# Patient Record
Sex: Female | Born: 1960 | Race: Asian | Hispanic: No | Marital: Married | Smoking: Never smoker
Health system: Southern US, Community
[De-identification: ages and names within clinical notes are randomized; demographics above are authoritative.]

## PROBLEM LIST (undated history)

## (undated) DIAGNOSIS — E538 Deficiency of other specified B group vitamins: Secondary | ICD-10-CM

## (undated) DIAGNOSIS — I10 Essential (primary) hypertension: Secondary | ICD-10-CM

## (undated) DIAGNOSIS — M948X9 Other specified disorders of cartilage, unspecified sites: Secondary | ICD-10-CM

## (undated) DIAGNOSIS — IMO0001 Reserved for inherently not codable concepts without codable children: Secondary | ICD-10-CM

## (undated) DIAGNOSIS — R209 Unspecified disturbances of skin sensation: Secondary | ICD-10-CM

## (undated) DIAGNOSIS — I639 Cerebral infarction, unspecified: Secondary | ICD-10-CM

## (undated) HISTORY — DX: Essential (primary) hypertension: I10

## (undated) HISTORY — DX: Reserved for inherently not codable concepts without codable children: IMO0001

## (undated) HISTORY — DX: Other specified disorders of cartilage, unspecified sites: M94.8X9

## (undated) HISTORY — DX: Unspecified disturbances of skin sensation: R20.9

## (undated) HISTORY — DX: Cerebral infarction, unspecified: I63.9

## (undated) HISTORY — DX: Deficiency of other specified B group vitamins: E53.8

---

## 1998-03-20 ENCOUNTER — Other Ambulatory Visit: Admission: RE | Admit: 1998-03-20 | Discharge: 1998-03-20 | Payer: Self-pay | Admitting: Gynecology

## 2000-04-26 ENCOUNTER — Encounter: Payer: Self-pay | Admitting: Gynecology

## 2000-04-26 ENCOUNTER — Encounter: Admission: RE | Admit: 2000-04-26 | Discharge: 2000-04-26 | Payer: Self-pay | Admitting: Gynecology

## 2001-05-01 ENCOUNTER — Other Ambulatory Visit: Admission: RE | Admit: 2001-05-01 | Discharge: 2001-05-01 | Payer: Self-pay | Admitting: Gynecology

## 2001-05-10 ENCOUNTER — Encounter (INDEPENDENT_AMBULATORY_CARE_PROVIDER_SITE_OTHER): Payer: Self-pay | Admitting: *Deleted

## 2001-05-10 ENCOUNTER — Other Ambulatory Visit: Admission: RE | Admit: 2001-05-10 | Discharge: 2001-05-10 | Payer: Self-pay | Admitting: Gynecology

## 2001-08-22 ENCOUNTER — Encounter: Payer: Self-pay | Admitting: Gynecology

## 2001-08-22 ENCOUNTER — Encounter: Admission: RE | Admit: 2001-08-22 | Discharge: 2001-08-22 | Payer: Self-pay | Admitting: Gynecology

## 2002-07-30 ENCOUNTER — Other Ambulatory Visit: Admission: RE | Admit: 2002-07-30 | Discharge: 2002-07-30 | Payer: Self-pay | Admitting: Gynecology

## 2002-09-03 ENCOUNTER — Encounter: Admission: RE | Admit: 2002-09-03 | Discharge: 2002-09-03 | Payer: Self-pay | Admitting: Gynecology

## 2002-09-03 ENCOUNTER — Encounter: Payer: Self-pay | Admitting: Gynecology

## 2003-09-15 ENCOUNTER — Other Ambulatory Visit: Admission: RE | Admit: 2003-09-15 | Discharge: 2003-09-15 | Payer: Self-pay | Admitting: Gynecology

## 2003-10-02 ENCOUNTER — Encounter: Admission: RE | Admit: 2003-10-02 | Discharge: 2003-10-02 | Payer: Self-pay | Admitting: Gynecology

## 2004-04-05 ENCOUNTER — Other Ambulatory Visit: Admission: RE | Admit: 2004-04-05 | Discharge: 2004-04-05 | Payer: Self-pay | Admitting: Gynecology

## 2004-11-12 ENCOUNTER — Encounter: Admission: RE | Admit: 2004-11-12 | Discharge: 2004-11-12 | Payer: Self-pay | Admitting: Gynecology

## 2004-12-23 ENCOUNTER — Other Ambulatory Visit: Admission: RE | Admit: 2004-12-23 | Discharge: 2004-12-23 | Payer: Self-pay | Admitting: Gynecology

## 2005-11-30 ENCOUNTER — Encounter: Admission: RE | Admit: 2005-11-30 | Discharge: 2005-11-30 | Payer: Self-pay | Admitting: Gynecology

## 2006-01-18 ENCOUNTER — Other Ambulatory Visit: Admission: RE | Admit: 2006-01-18 | Discharge: 2006-01-18 | Payer: Self-pay | Admitting: Gynecology

## 2006-12-01 ENCOUNTER — Encounter: Admission: RE | Admit: 2006-12-01 | Discharge: 2006-12-01 | Payer: Self-pay | Admitting: Gynecology

## 2007-01-24 ENCOUNTER — Other Ambulatory Visit: Admission: RE | Admit: 2007-01-24 | Discharge: 2007-01-24 | Payer: Self-pay | Admitting: Gynecology

## 2007-12-04 ENCOUNTER — Encounter: Admission: RE | Admit: 2007-12-04 | Discharge: 2007-12-04 | Payer: Self-pay | Admitting: Gynecology

## 2008-12-10 ENCOUNTER — Encounter: Admission: RE | Admit: 2008-12-10 | Discharge: 2008-12-10 | Payer: Self-pay | Admitting: Gynecology

## 2009-12-11 ENCOUNTER — Encounter: Admission: RE | Admit: 2009-12-11 | Discharge: 2009-12-11 | Payer: Self-pay | Admitting: Gynecology

## 2010-11-06 ENCOUNTER — Other Ambulatory Visit: Payer: Self-pay | Admitting: Gynecology

## 2010-11-06 DIAGNOSIS — Z1239 Encounter for other screening for malignant neoplasm of breast: Secondary | ICD-10-CM

## 2010-11-07 ENCOUNTER — Encounter: Payer: Self-pay | Admitting: Endocrinology

## 2010-12-14 ENCOUNTER — Ambulatory Visit
Admission: RE | Admit: 2010-12-14 | Discharge: 2010-12-14 | Disposition: A | Discharge status: Home / Self Care | Payer: Self-pay | Source: Ambulatory Visit | Attending: Gynecology | Admitting: Gynecology

## 2010-12-14 DIAGNOSIS — Z1239 Encounter for other screening for malignant neoplasm of breast: Secondary | ICD-10-CM

## 2011-02-15 LAB — HM PAP SMEAR: HM Pap smear: NORMAL

## 2011-11-09 ENCOUNTER — Other Ambulatory Visit: Payer: Self-pay | Admitting: Gynecology

## 2011-11-09 DIAGNOSIS — Z1231 Encounter for screening mammogram for malignant neoplasm of breast: Secondary | ICD-10-CM

## 2011-12-16 ENCOUNTER — Ambulatory Visit
Admission: RE | Admit: 2011-12-16 | Discharge: 2011-12-16 | Disposition: A | Discharge status: Home / Self Care | Payer: PRIVATE HEALTH INSURANCE | Source: Ambulatory Visit | Attending: Gynecology | Admitting: Gynecology

## 2011-12-16 DIAGNOSIS — Z1231 Encounter for screening mammogram for malignant neoplasm of breast: Secondary | ICD-10-CM

## 2011-12-16 LAB — HM MAMMOGRAPHY: HM Mammogram: NEGATIVE

## 2012-08-08 ENCOUNTER — Encounter: Payer: Self-pay | Admitting: Gastroenterology

## 2012-08-31 ENCOUNTER — Ambulatory Visit (AMBULATORY_SURGERY_CENTER): Payer: PRIVATE HEALTH INSURANCE | Admitting: *Deleted

## 2012-08-31 VITALS — Ht 62.0 in | Wt 132.0 lb

## 2012-08-31 DIAGNOSIS — Z1211 Encounter for screening for malignant neoplasm of colon: Secondary | ICD-10-CM

## 2012-08-31 MED ORDER — NA SULFATE-K SULFATE-MG SULF 17.5-3.13-1.6 GM/177ML PO SOLN: ORAL | Status: DC

## 2012-09-27 ENCOUNTER — Encounter: Payer: PRIVATE HEALTH INSURANCE | Admitting: Gastroenterology

## 2012-09-27 LAB — HM COLONOSCOPY: HM Colonoscopy: NORMAL

## 2012-11-20 ENCOUNTER — Other Ambulatory Visit: Payer: Self-pay | Admitting: Gynecology

## 2012-11-20 DIAGNOSIS — Z1231 Encounter for screening mammogram for malignant neoplasm of breast: Secondary | ICD-10-CM

## 2012-12-18 ENCOUNTER — Ambulatory Visit
Admission: RE | Admit: 2012-12-18 | Discharge: 2012-12-18 | Disposition: A | Discharge status: Home / Self Care | Payer: PRIVATE HEALTH INSURANCE | Source: Ambulatory Visit | Attending: Gynecology | Admitting: Gynecology

## 2012-12-18 DIAGNOSIS — Z1231 Encounter for screening mammogram for malignant neoplasm of breast: Secondary | ICD-10-CM

## 2013-04-08 ENCOUNTER — Encounter: Payer: Self-pay | Admitting: *Deleted

## 2013-04-08 ENCOUNTER — Other Ambulatory Visit: Payer: Self-pay | Admitting: *Deleted

## 2013-04-10 ENCOUNTER — Ambulatory Visit (INDEPENDENT_AMBULATORY_CARE_PROVIDER_SITE_OTHER): Payer: No Typology Code available for payment source | Admitting: Internal Medicine

## 2013-04-10 ENCOUNTER — Encounter: Payer: Self-pay | Admitting: Internal Medicine

## 2013-04-10 VITALS — BP 140/84 | HR 72 | Temp 98.1°F | Resp 16 | Ht 62.0 in | Wt 135.0 lb

## 2013-04-10 DIAGNOSIS — N951 Menopausal and female climacteric states: Secondary | ICD-10-CM

## 2013-04-10 DIAGNOSIS — I10 Essential (primary) hypertension: Secondary | ICD-10-CM | POA: Insufficient documentation

## 2013-04-10 DIAGNOSIS — M25529 Pain in unspecified elbow: Secondary | ICD-10-CM | POA: Insufficient documentation

## 2013-04-10 NOTE — Patient Instructions (Addendum)
Primrose oil- try using this for your hot flashes for now   Health Recommendations for Postmenopausal Women Respected and ongoing research has looked at the most common causes of death, disability, and poor quality of life in postmenopausal women. The causes include heart disease, diseases of blood vessels, diabetes, depression, cancer, and bone loss (osteoporosis). Many things can be done to help lower the chances of developing these and other common problems: CARDIOVASCULAR DISEASE Heart Disease: A heart attack is a medical emergency. Know the signs and symptoms of a heart attack. Below are things women can do to reduce their risk for heart disease.   Do not smoke. If you smoke, quit.  Aim for a healthy weight. Being overweight causes many preventable deaths. Eat a healthy and balanced diet and drink an adequate amount of liquids.  Get moving. Make a commitment to be more physically active. Aim for 30 minutes of activity on most, if not all days of the week.  Eat for heart health. Choose a diet that is low in saturated fat and cholesterol and eliminate trans fat. Include whole grains, vegetables, and fruits. Read and understand the labels on food containers before buying.  Know your numbers. Ask your caregiver to check your blood pressure, cholesterol (total, HDL, LDL, triglycerides) and blood glucose. Work with your caregiver on improving your entire clinical picture.  High blood pressure. Limit or stop your table salt intake (try salt substitute and food seasonings). Avoid salty foods and drinks. Read labels on food containers before buying. Eating well and exercising can help control high blood pressure. STROKE  Stroke is a medical emergency. Stroke may be the result of a blood clot in a blood vessel in the brain or by a brain hemorrhage (bleeding). Know the signs and symptoms of a stroke. To lower the risk of developing a stroke:  Avoid fatty foods.  Quit smoking.  Control your  diabetes, blood pressure, and irregular heart rate. THROMBOPHLEBITIS (BLOOD CLOT) OF THE LEG  Becoming overweight and leading a stationary lifestyle may also contribute to developing blood clots. Controlling your diet and exercising will help lower the risk of developing blood clots. CANCER SCREENING  Breast Cancer: Take steps to reduce your risk of breast cancer.  You should practice "breast self-awareness." This means understanding the normal appearance and feel of your breasts and should include breast self-examination. Any changes detected, no matter how small, should be reported to your caregiver.  After age 35, you should have a clinical breast exam (CBE) every year.  Starting at age 28, you should consider having a mammogram (breast X-ray) every year.  If you have a family history of breast cancer, talk to your caregiver about genetic screening.  If you are at high risk for breast cancer, talk to your caregiver about having an MRI and a mammogram every year.  Intestinal or Stomach Cancer: Tests to consider are a rectal exam, fecal occult blood, sigmoidoscopy, and colonoscopy. Women who are high risk may need to be screened at an earlier age and more often.  Cervical Cancer:  Beginning at age 59, you should have a Pap test every 3 years as long as the past 3 Pap tests have been normal.  If you have had past treatment for cervical cancer or a condition that could lead to cancer, you need Pap tests and screening for cancer for at least 20 years after your treatment.  If you had a hysterectomy for a problem that was not cancer or a condition  that could lead to cancer, then you no longer need Pap tests.  If you are between ages 90 and 29, and you have had normal Pap tests going back 10 years, you no longer need Pap tests.  If Pap tests have been discontinued, risk factors (such as a new sexual partner) need to be reassessed to determine if screening should be resumed.  Some medical  problems can increase the chance of getting cervical cancer. In these cases, your caregiver may recommend more frequent screening and Pap tests.  Uterine Cancer: If you have vaginal bleeding after reaching menopause, you should notify your caregiver.  Ovarian cancer: Other than yearly pelvic exams, there are no reliable tests available to screen for ovarian cancer at this time except for yearly pelvic exams.  Lung Cancer: Yearly chest X-rays can detect lung cancer and should be done on high risk women, such as cigarette smokers and women with chronic lung disease (emphysema).  Skin Cancer: A complete body skin exam should be done at your yearly examination. Avoid overexposure to the sun and ultraviolet light lamps. Use a strong sun block cream when in the sun. All of these things are important in lowering the risk of skin cancer. MENOPAUSE Menopause Symptoms: Hormone therapy products are effective for treating symptoms associated with menopause:  Moderate to severe hot flashes.  Night sweats.  Mood swings.  Headaches.  Tiredness.  Loss of sex drive.  Insomnia.  Other symptoms. Hormone replacement carries certain risks, especially in older women. Women who use or are thinking about using estrogen or estrogen with progestin treatments should discuss that with their caregiver. Your caregiver will help you understand the benefits and risks. The ideal dose of hormone replacement therapy is not known. The Food and Drug Administration (FDA) has concluded that hormone therapy should be used only at the lowest doses and for the shortest amount of time to reach treatment goals.  OSTEOPOROSIS Protecting Against Bone Loss and Preventing Fracture: If you use hormone therapy for prevention of bone loss (osteoporosis), the risks for bone loss must outweigh the risk of the therapy. Ask your caregiver about other medications known to be safe and effective for preventing bone loss and fractures. To guard  against bone loss or fractures, the following is recommended:  If you are less than age 79, take 1000 mg of calcium and at least 600 mg of Vitamin D per day.  If you are greater than age 43 but less than age 65, take 1200 mg of calcium and at least 600 mg of Vitamin D per day.  If you are greater than age 62, take 1200 mg of calcium and at least 800 mg of Vitamin D per day. Smoking and excessive alcohol intake increases the risk of osteoporosis. Eat foods rich in calcium and vitamin D and do weight bearing exercises several times a week as your caregiver suggests. DIABETES Diabetes Melitus: If you have Type I or Type 2 diabetes, you should keep your blood sugar under control with diet, exercise and recommended medication. Avoid too many sweets, starchy and fatty foods. Being overweight can make control more difficult. COGNITION AND MEMORY Cognition and Memory: Menopausal hormone therapy is not recommended for the prevention of cognitive disorders such as Alzheimer's disease or memory loss.  DEPRESSION  Depression may occur at any age, but is common in elderly women. The reasons may be because of physical, medical, social (loneliness), or financial problems and needs. If you are experiencing depression because of medical problems  and control of symptoms, talk to your caregiver about this. Physical activity and exercise may help with mood and sleep. Community and volunteer involvement may help your sense of value and worth. If you have depression and you feel that the problem is getting worse or becoming severe, talk to your caregiver about treatment options that are best for you. ACCIDENTS  Accidents are common and can be serious in the elderly woman. Prepare your house to prevent accidents. Eliminate throw rugs, place hand bars in the bath, shower and toilet areas. Avoid wearing high heeled shoes or walking on wet, snowy, and icy areas. Limit or stop driving if you have vision or hearing problems, or  you feel you are unsteady with you movements and reflexes. HEPATITIS C Hepatitis C is a type of viral infection affecting the liver. It is spread mainly through contact with blood from an infected person. It can be treated, but if left untreated, it can lead to severe liver damage over years. Many people who are infected do not know that the virus is in their blood. If you are a "baby-boomer", it is recommended that you have one screening test for Hepatitis C. IMMUNIZATIONS  Several immunizations are important to consider having during your senior years, including:   Tetanus, diptheria, and pertussis booster shot.  Influenza every year before the flu season begins.  Pneumonia vaccine.  Shingles vaccine.  Others as indicated based on your specific needs. Talk to your caregiver about these. Document Released: 11/25/2005 Document Revised: 09/19/2012 Document Reviewed: 07/21/2008 [facility] Patient Information 2014 [place], [place].

## 2013-04-10 NOTE — Progress Notes (Signed)
Patient ID: Judy Gutierrez, female   DOB: Aug 23, 1961, 52 y.o.   MRN: [id number]  Chief Complaint  Patient presents with  . Hot Flashes  . Joint Pain   HPI  Has been post menopausal for 2 years. She has been having worsening of her hot flashes. This is making her feel uncomfortable  No history of CAD, DVT, blood clots No vaginal dryness, itching or discharge Denies smoking or alcohol consumption No headache No face pain  Has diffuse joint pain at night time for few months. Denies any joint swelling or redness  Review of Systems  Constitutional: Negative for fever and chills.  HENT: Negative for congestion.   Respiratory: Negative for cough and shortness of breath.   Cardiovascular: Negative for chest pain and palpitations.  Gastrointestinal: Negative for heartburn.  Genitourinary: Negative for dysuria.  Musculoskeletal: Positive for joint pain.  Skin: Negative for itching and rash.  Neurological: Negative for dizziness, focal weakness and weakness.  Psychiatric/Behavioral: Negative for depression. The patient does not have insomnia.    No Known Allergies  Past Medical History  Diagnosis Date  . Myalgia and myositis, unspecified   . Other B-complex deficiencies   . Unspecified essential hypertension   . 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 Disturbance of skin sensation     BP 140/84  Pulse 72  Temp(Src) 98.1 F (36.7 C) (Oral)  Resp 16  Ht 5\' 2"  (1.575 m)  Wt 135 lb (61.236 kg)  BMI 24.69 kg/m2  SpO2 97%  LMP 12/08/2010  gen- adult female in NAD HEENT- no pallor, no icterus, no LAD, MMM cvs- ns 1,s2, rrr respi- ctab Ext- no edema or swelling, no joint deformity Neuro- aaox 3, no focal deficit  A/P-  Hot flashes- likely post menopausal. Provided education with materials to read on this. To try primrose oil for now. Behavioral modification also discussed. If no help, will try premarin low dose and reassess  HTN- bp remains well controlled. Off  all meds for now. Watching her diet and exercising  Diffuse joint pain- concerns for arthralgia with her long working hours where she needs to be on her feet. Supportive therapy with warm shower before bed and prn tylenol for now. Workup for myositis like ESR and CK have been normal in past  Labs- cbc, cmp, tsh, flp prior to next visit for physical

## 2013-09-02 ENCOUNTER — Other Ambulatory Visit: Payer: No Typology Code available for payment source

## 2013-09-04 ENCOUNTER — Encounter: Payer: No Typology Code available for payment source | Admitting: Internal Medicine

## 2013-09-10 ENCOUNTER — Other Ambulatory Visit: Payer: No Typology Code available for payment source

## 2013-09-16 ENCOUNTER — Other Ambulatory Visit: Payer: No Typology Code available for payment source

## 2013-09-16 DIAGNOSIS — N951 Menopausal and female climacteric states: Secondary | ICD-10-CM

## 2013-09-16 DIAGNOSIS — I10 Essential (primary) hypertension: Secondary | ICD-10-CM

## 2013-09-16 DIAGNOSIS — M25529 Pain in unspecified elbow: Secondary | ICD-10-CM

## 2013-09-17 LAB — CBC WITH DIFFERENTIAL/PLATELET
Basophils Absolute: 0 10*3/uL (ref 0.0–0.2)
Basos: 0 %
Eos: 5 %
Eosinophils Absolute: 0.2 10*3/uL (ref 0.0–0.4)
HCT: 37.5 % (ref 34.0–46.6)
Hemoglobin: 12.7 g/dL (ref 11.1–15.9)
Immature Grans (Abs): 0 10*3/uL (ref 0.0–0.1)
Immature Granulocytes: 0 %
Lymphocytes Absolute: 2.1 10*3/uL (ref 0.7–3.1)
Lymphs: 49 %
MCH: 28.3 pg (ref 26.6–33.0)
MCHC: 33.9 g/dL (ref 31.5–35.7)
MCV: 84 fL (ref 79–97)
Monocytes Absolute: 0.2 10*3/uL (ref 0.1–0.9)
Monocytes: 6 %
Neutrophils Absolute: 1.7 10*3/uL (ref 1.4–7.0)
Neutrophils Relative %: 40 %
RBC: 4.48 x10E6/uL (ref 3.77–5.28)
RDW: 12.6 % (ref 12.3–15.4)
WBC: 4.3 10*3/uL (ref 3.4–10.8)

## 2013-09-17 LAB — COMPREHENSIVE METABOLIC PANEL
ALT: 28 IU/L (ref 0–32)
AST: 28 IU/L (ref 0–40)
Albumin/Globulin Ratio: 1.3 (ref 1.1–2.5)
Albumin: 4.2 g/dL (ref 3.5–5.5)
Alkaline Phosphatase: 125 IU/L — ABNORMAL HIGH (ref 39–117)
BUN/Creatinine Ratio: 25 — ABNORMAL HIGH (ref 9–23)
BUN: 15 mg/dL (ref 6–24)
CO2: 24 mmol/L (ref 18–29)
Calcium: 9.5 mg/dL (ref 8.7–10.2)
Chloride: 104 mmol/L (ref 97–108)
Creatinine, Ser: 0.59 mg/dL (ref 0.57–1.00)
GFR calc Af Amer: 122 mL/min/{1.73_m2} (ref >59)
GFR calc non Af Amer: 106 mL/min/{1.73_m2} (ref >59)
Globulin, Total: 3.2 g/dL (ref 1.5–4.5)
Glucose: 105 mg/dL — ABNORMAL HIGH (ref 65–99)
Potassium: 4.3 mmol/L (ref 3.5–5.2)
Sodium: 143 mmol/L (ref 134–144)
Total Bilirubin: 0.4 mg/dL (ref 0.0–1.2)
Total Protein: 7.4 g/dL (ref 6.0–8.5)

## 2013-09-17 LAB — LIPID PANEL
Chol/HDL Ratio: 2.8 ratio units (ref 0.0–4.4)
Cholesterol, Total: 196 mg/dL (ref 100–199)
HDL: 70 mg/dL (ref >39)
LDL Calculated: 117 mg/dL — ABNORMAL HIGH (ref 0–99)
Triglycerides: 47 mg/dL (ref 0–149)
VLDL Cholesterol Cal: 9 mg/dL (ref 5–40)

## 2013-09-17 LAB — TSH: TSH: 3.59 u[IU]/mL (ref 0.450–4.500)

## 2013-09-18 ENCOUNTER — Ambulatory Visit (INDEPENDENT_AMBULATORY_CARE_PROVIDER_SITE_OTHER): Payer: No Typology Code available for payment source | Admitting: Internal Medicine

## 2013-09-18 ENCOUNTER — Encounter: Payer: Self-pay | Admitting: Internal Medicine

## 2013-09-18 VITALS — BP 130/80 | HR 84 | Temp 97.4°F | Resp 12 | Ht 60.0 in | Wt 138.2 lb

## 2013-09-18 DIAGNOSIS — Z0000 Encounter for general adult medical examination without abnormal findings: Secondary | ICD-10-CM

## 2013-09-18 DIAGNOSIS — R7309 Other abnormal glucose: Secondary | ICD-10-CM

## 2013-09-18 DIAGNOSIS — R748 Abnormal levels of other serum enzymes: Secondary | ICD-10-CM | POA: Insufficient documentation

## 2013-09-18 DIAGNOSIS — R739 Hyperglycemia, unspecified: Secondary | ICD-10-CM | POA: Insufficient documentation

## 2013-09-18 NOTE — Progress Notes (Signed)
Patient ID: Judy Gutierrez, female   DOB: 1961-06-06, 52 y.o.   MRN: [id number]  Chief complaint- annual exam  No Known Allergies  HPI 52 y/o female patient is here for her annual visit. She has history of hypertension but never been on any medication and now her bp has been normal for 2 years.   Mammogram feb 2014 normal Pap smear normal at her gyn office She cancelled her colonoscopy last year as she was scared. She agrees to FOBT card Bone scan last year was normal as per patient Does not want influenza vaccine  Review of Systems  Constitutional: Negative for fever, chills, weight loss, malaise/fatigue and diaphoresis.  HENT: Negative for congestion, hearing loss and sore throat.   Eyes: Negative for blurred vision, double vision and discharge.  Respiratory: Negative for cough, sputum production, shortness of breath and wheezing.   Cardiovascular: Negative for chest pain, palpitations, orthopnea and leg swelling.  Gastrointestinal: Negative for heartburn, nausea, vomiting, abdominal pain, diarrhea and constipation.  Genitourinary: Negative for dysuria, urgency, frequency and flank pain.  Musculoskeletal: Negative for back pain, falls, joint pain and myalgias.  Skin: Negative for itching and rash.  Neurological: Negative for dizziness, tingling, focal weakness and headaches.  Psychiatric/Behavioral: Negative for depression and memory loss. The patient is not nervous/anxious.    Past Medical History  Diagnosis Date  . Myalgia and myositis, unspecified   . Other B-complex deficiencies   . Unspecified essential hypertension   . 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 Disturbance of skin sensation    Past Surgical History  Procedure Laterality Date  . Cesarean section  1992   Current Outpatient Prescriptions on File Prior to Visit  Medication Sig Dispense Refill  . calcium carbonate (OS-CAL) 600 MG TABS Take 600 mg by mouth daily.      . Multiple Vitamins-Minerals  (MULTIVITAMIN WITH MINERALS) tablet Take 1 tablet by mouth daily.       No current facility-administered medications on file prior to visit.   History reviewed. No pertinent family history.  History   Social History  . Marital Status: Married    Spouse Name: N/A    Number of Children: N/A  . Years of Education: N/A   Occupational History  . Not on file.   Social History Main Topics  . Smoking status: Never Smoker   . Smokeless tobacco: Never Used  . Alcohol Use: No  . Drug Use: No  . Sexual Activity: Yes   Other Topics Concern  . Not on file   Social History Narrative  . No narrative on file   Physical exam BP 130/80  Pulse 84  Temp(Src) 97.4 F (36.3 C) (Oral)  Resp 12  Ht 5' (1.524 m)  Wt 138 lb 3.2 oz (62.687 kg)  BMI 26.99 kg/m2  SpO2 99%  LMP 12/08/2010  General- adult female in no acute distress Head- atraumatic, normocephalic Eyes- PERRLA, EOMI, no pallor, no icterus, no discharge Neck- no lymphadenopathy, no thyromegaly, no jugular vein distension, no carotid bruit Ears- left ear normal tympanic membrane and normal external ear canal , right ear normal tympanic membrane and normal external ear canal Chest- no chest wall deformities, no chest wall tenderness Breast- done by gyn Cardiovascular- normal s1,s2, no murmurs/ rubs/ gallops Respiratory- bilateral clear to auscultation, no wheeze, no rhonchi, no crackles Abdomen- bowel sounds present, soft, non tender, no organomegaly, no abdominal bruits, no guarding or rigidity, no CVA tenderness Pelvic exam- done by her gyn Musculoskeletal-  able to move all 4 extremities, no spinal and paraspinal tenderness, steady gait, no use of assistive device Neurological- no focal deficit, normal reflexes, normal muscle strength, normal sensation to fine touch and vibration Psychiatry- alert and oriented to person, place and time, normal mood and affect Skin- warm and dr, no rash or lesions  Labs reviewed CBC     Component Value Date/Time   WBC 4.3 09/16/2013 0820   RBC 4.48 09/16/2013 0820   HGB 12.7 09/16/2013 0820   HCT 37.5 09/16/2013 0820   MCV 84 09/16/2013 0820   MCH 28.3 09/16/2013 0820   MCHC 33.9 09/16/2013 0820   RDW 12.6 09/16/2013 0820   LYMPHSABS 2.1 09/16/2013 0820   EOSABS 0.2 09/16/2013 0820   BASOSABS 0.0 09/16/2013 0820    CMP     Component Value Date/Time   NA 143 09/16/2013 0820   K 4.3 09/16/2013 0820   CL 104 09/16/2013 0820   CO2 24 09/16/2013 0820   GLUCOSE 105* 09/16/2013 0820   BUN 15 09/16/2013 0820   CREATININE 0.59 09/16/2013 0820   CALCIUM 9.5 09/16/2013 0820   PROT 7.4 09/16/2013 0820   AST 28 09/16/2013 0820   ALT 28 09/16/2013 0820   ALKPHOS 125* 09/16/2013 0820   BILITOT 0.4 09/16/2013 0820   GFRNONAA 106 09/16/2013 0820   GFRAA 122 09/16/2013 0820   Lipid Panel     Component Value Date/Time   TRIG 47 09/16/2013 0820   HDL 70 09/16/2013 0820   CHOLHDL 2.8 09/16/2013 0820   LDLCALC 117* 09/16/2013 0820   Lab Results  Component Value Date   TSH 3.590 09/16/2013    Assessment/plan  1. Hyperglycemia Cut down on sweets and fried food. Dietary and exercise counselling provided. Has family hx of dm, will rule out dm by checking a1c next visit - Hemoglobin A1c; Future - Basic Metabolic Panel; Future  2. Routine general medical examination at a health care facility uptodate with her gyn exam. Does not want colonoscopy. Provided FOBT card with instructions. Refuses influenza vaccine. Reviewed labs. Dietary and exercise counselling. dexa scan uptodate  3. Elevated alkaline phosphatase level Currently no symptoms. Will monitor clinically and follow lab in 3 months. Explained to notify if has abdominal pain - Hepatic Function Panel; Future

## 2013-09-24 ENCOUNTER — Other Ambulatory Visit: Payer: Self-pay | Admitting: Internal Medicine

## 2013-09-24 ENCOUNTER — Other Ambulatory Visit: Payer: No Typology Code available for payment source

## 2013-09-24 DIAGNOSIS — Z1211 Encounter for screening for malignant neoplasm of colon: Secondary | ICD-10-CM

## 2013-09-26 LAB — FECAL OCCULT BLOOD, IMMUNOCHEMICAL: Fecal Occult Bld: NEGATIVE

## 2013-10-23 ENCOUNTER — Other Ambulatory Visit: Payer: Self-pay | Admitting: Physical Medicine and Rehabilitation

## 2013-10-23 ENCOUNTER — Encounter: Payer: Self-pay | Admitting: *Deleted

## 2013-10-23 ENCOUNTER — Inpatient Hospital Stay (HOSPITAL_COMMUNITY)
Admission: RE | Admit: 2013-10-23 | Discharge: 2013-10-29 | DRG: 945 | Disposition: A | Discharge status: Home / Self Care | Payer: BC Managed Care – PPO | Source: Intra-hospital | Attending: Physical Medicine & Rehabilitation | Admitting: Physical Medicine & Rehabilitation

## 2013-10-23 DIAGNOSIS — R739 Hyperglycemia, unspecified: Secondary | ICD-10-CM

## 2013-10-23 DIAGNOSIS — S52309A Unspecified fracture of shaft of unspecified radius, initial encounter for closed fracture: Secondary | ICD-10-CM

## 2013-10-23 DIAGNOSIS — M25529 Pain in unspecified elbow: Secondary | ICD-10-CM

## 2013-10-23 DIAGNOSIS — S329XXA Fracture of unspecified parts of lumbosacral spine and pelvis, initial encounter for closed fracture: Secondary | ICD-10-CM

## 2013-10-23 DIAGNOSIS — A498 Other bacterial infections of unspecified site: Secondary | ICD-10-CM | POA: Diagnosis present

## 2013-10-23 DIAGNOSIS — I10 Essential (primary) hypertension: Secondary | ICD-10-CM | POA: Diagnosis present

## 2013-10-23 DIAGNOSIS — S322XXA Fracture of coccyx, initial encounter for closed fracture: Secondary | ICD-10-CM

## 2013-10-23 DIAGNOSIS — D62 Acute posthemorrhagic anemia: Secondary | ICD-10-CM | POA: Diagnosis present

## 2013-10-23 DIAGNOSIS — B9620 Unspecified Escherichia coli [E. coli] as the cause of diseases classified elsewhere: Secondary | ICD-10-CM | POA: Diagnosis present

## 2013-10-23 DIAGNOSIS — S3210XA Unspecified fracture of sacrum, initial encounter for closed fracture: Secondary | ICD-10-CM | POA: Diagnosis present

## 2013-10-23 DIAGNOSIS — Z5189 Encounter for other specified aftercare: Principal | ICD-10-CM

## 2013-10-23 DIAGNOSIS — J982 Interstitial emphysema: Secondary | ICD-10-CM | POA: Diagnosis present

## 2013-10-23 DIAGNOSIS — S52209A Unspecified fracture of shaft of unspecified ulna, initial encounter for closed fracture: Secondary | ICD-10-CM | POA: Diagnosis present

## 2013-10-23 DIAGNOSIS — N390 Urinary tract infection, site not specified: Secondary | ICD-10-CM | POA: Diagnosis present

## 2013-10-23 DIAGNOSIS — F411 Generalized anxiety disorder: Secondary | ICD-10-CM | POA: Diagnosis present

## 2013-10-23 DIAGNOSIS — K5900 Constipation, unspecified: Secondary | ICD-10-CM | POA: Diagnosis present

## 2013-10-23 DIAGNOSIS — S329XXS Fracture of unspecified parts of lumbosacral spine and pelvis, sequela: Secondary | ICD-10-CM

## 2013-10-23 DIAGNOSIS — S32509A Unspecified fracture of unspecified pubis, initial encounter for closed fracture: Secondary | ICD-10-CM | POA: Diagnosis present

## 2013-10-23 DIAGNOSIS — H538 Other visual disturbances: Secondary | ICD-10-CM | POA: Diagnosis present

## 2013-10-23 LAB — URINE MICROSCOPIC-ADD ON

## 2013-10-23 LAB — URINALYSIS, ROUTINE W REFLEX MICROSCOPIC
Bilirubin Urine: NEGATIVE
Glucose, UA: NEGATIVE mg/dL
Ketones, ur: NEGATIVE mg/dL
Nitrite: POSITIVE — AB
Protein, ur: NEGATIVE mg/dL
Specific Gravity, Urine: 1.017 (ref 1.005–1.030)
Urobilinogen, UA: 0.2 mg/dL (ref 0.0–1.0)
pH: 6.5 (ref 5.0–8.0)

## 2013-10-23 MED ORDER — PROCHLORPERAZINE EDISYLATE 5 MG/ML IJ SOLN
5.0000 mg | Freq: Four times a day (QID) | INTRAMUSCULAR | Status: DC | PRN
  Filled 2013-10-23: qty 2

## 2013-10-23 MED ORDER — ACETAMINOPHEN 325 MG PO TABS
325.0000 mg | ORAL_TABLET | ORAL | Status: DC | PRN
  Administered 2013-10-25: 650 mg via ORAL
  Filled 2013-10-23: qty 2

## 2013-10-23 MED ORDER — ALUM & MAG HYDROXIDE-SIMETH 200-200-20 MG/5ML PO SUSP: 30.0000 mL | ORAL | Status: DC | PRN

## 2013-10-23 MED ORDER — OXYCODONE HCL 5 MG PO TABS: 5.0000 mg | ORAL_TABLET | ORAL | Status: DC | PRN

## 2013-10-23 MED ORDER — DIPHENHYDRAMINE HCL 12.5 MG/5ML PO ELIX
12.5000 mg | ORAL_SOLUTION | Freq: Four times a day (QID) | ORAL | Status: DC | PRN
  Filled 2013-10-23: qty 10

## 2013-10-23 MED ORDER — FLEET ENEMA 7-19 GM/118ML RE ENEM
1.0000 | ENEMA | Freq: Once | RECTAL | Status: AC | PRN
  Filled 2013-10-23: qty 1

## 2013-10-23 MED ORDER — SENNOSIDES-DOCUSATE SODIUM 8.6-50 MG PO TABS
1.0000 | ORAL_TABLET | Freq: Every day | ORAL | Status: DC
  Administered 2013-10-23 – 2013-10-28 (×6): 1 via ORAL
  Filled 2013-10-23 (×7): qty 1

## 2013-10-23 MED ORDER — ENOXAPARIN SODIUM 40 MG/0.4ML ~~LOC~~ SOLN
40.0000 mg | Freq: Every morning | SUBCUTANEOUS | Status: DC
  Administered 2013-10-24 – 2013-10-29 (×6): 40 mg via SUBCUTANEOUS
  Filled 2013-10-23 (×6): qty 0.4

## 2013-10-23 MED ORDER — PROCHLORPERAZINE 25 MG RE SUPP
12.5000 mg | Freq: Four times a day (QID) | RECTAL | Status: DC | PRN
  Filled 2013-10-23: qty 1

## 2013-10-23 MED ORDER — OXYCODONE-ACETAMINOPHEN 5-325 MG PO TABS
1.0000 | ORAL_TABLET | ORAL | Status: DC | PRN
  Administered 2013-10-24 – 2013-10-26 (×4): 1 via ORAL
  Filled 2013-10-23: qty 1
  Filled 2013-10-23: qty 2
  Filled 2013-10-23 (×2): qty 1

## 2013-10-23 MED ORDER — GUAIFENESIN-DM 100-10 MG/5ML PO SYRP: 5.0000 mL | ORAL_SOLUTION | Freq: Four times a day (QID) | ORAL | Status: DC | PRN

## 2013-10-23 MED ORDER — BISACODYL 10 MG RE SUPP: 10.0000 mg | Freq: Every day | RECTAL | Status: DC | PRN

## 2013-10-23 MED ORDER — PROCHLORPERAZINE MALEATE 5 MG PO TABS
5.0000 mg | ORAL_TABLET | Freq: Four times a day (QID) | ORAL | Status: DC | PRN
  Filled 2013-10-23: qty 2

## 2013-10-23 MED ORDER — METHOCARBAMOL 500 MG PO TABS: 500.0000 mg | ORAL_TABLET | Freq: Four times a day (QID) | ORAL | Status: DC | PRN

## 2013-10-23 MED ORDER — TRAZODONE HCL 50 MG PO TABS: 25.0000 mg | ORAL_TABLET | Freq: Every evening | ORAL | Status: DC | PRN

## 2013-10-23 MED ORDER — TRAMADOL HCL 50 MG PO TABS: 50.0000 mg | ORAL_TABLET | Freq: Four times a day (QID) | ORAL | Status: DC | PRN

## 2013-10-23 NOTE — Progress Notes (Signed)
Patient ID: Judy CumminsShilpa S Dolin, female   DOB: May 25, 1961, 53 y.o.   MRN: [id number] Patient admitted to room 4MW08 via stretcher, escorted by EMS staff and family.  Patient and spouse verbalized understanding of rehab process, no questions asked.  Verbalized agreement and signed fall safety plan.  VSS.  Appears to be in no immediate distress at this time.  Dani Gobbleeardon, Kamsiyochukwu Buist J, RN
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
## 2013-10-24 ENCOUNTER — Inpatient Hospital Stay (HOSPITAL_COMMUNITY): Payer: BC Managed Care – PPO

## 2013-10-24 ENCOUNTER — Inpatient Hospital Stay (HOSPITAL_COMMUNITY): Payer: PRIVATE HEALTH INSURANCE

## 2013-10-24 ENCOUNTER — Inpatient Hospital Stay (HOSPITAL_COMMUNITY): Payer: BC Managed Care – PPO | Admitting: Occupational Therapy

## 2013-10-24 DIAGNOSIS — S52209A Unspecified fracture of shaft of unspecified ulna, initial encounter for closed fracture: Secondary | ICD-10-CM

## 2013-10-24 DIAGNOSIS — S52309A Unspecified fracture of shaft of unspecified radius, initial encounter for closed fracture: Secondary | ICD-10-CM

## 2013-10-24 DIAGNOSIS — S329XXA Fracture of unspecified parts of lumbosacral spine and pelvis, initial encounter for closed fracture: Secondary | ICD-10-CM

## 2013-10-24 DIAGNOSIS — D62 Acute posthemorrhagic anemia: Secondary | ICD-10-CM | POA: Diagnosis present

## 2013-10-24 LAB — CBC WITH DIFFERENTIAL/PLATELET
Basophils Absolute: 0 10*3/uL (ref 0.0–0.1)
Basophils Relative: 0 % (ref 0–1)
Eosinophils Absolute: 0.1 10*3/uL (ref 0.0–0.7)
Eosinophils Relative: 2 % (ref 0–5)
HCT: 30.8 % — ABNORMAL LOW (ref 36.0–46.0)
Hemoglobin: 10.1 g/dL — ABNORMAL LOW (ref 12.0–15.0)
Lymphocytes Relative: 28 % (ref 12–46)
Lymphs Abs: 1.5 10*3/uL (ref 0.7–4.0)
MCH: 28.5 pg (ref 26.0–34.0)
MCHC: 32.8 g/dL (ref 30.0–36.0)
MCV: 87 fL (ref 78.0–100.0)
Monocytes Absolute: 0.4 10*3/uL (ref 0.1–1.0)
Monocytes Relative: 7 % (ref 3–12)
Neutro Abs: 3.4 10*3/uL (ref 1.7–7.7)
Neutrophils Relative %: 63 % (ref 43–77)
Platelets: 324 10*3/uL (ref 150–400)
RBC: 3.54 MIL/uL — ABNORMAL LOW (ref 3.87–5.11)
RDW: 13 % (ref 11.5–15.5)
WBC: 5.4 10*3/uL (ref 4.0–10.5)

## 2013-10-24 LAB — COMPREHENSIVE METABOLIC PANEL
ALT: 25 U/L (ref 0–35)
AST: 22 U/L (ref 0–37)
Albumin: 3 g/dL — ABNORMAL LOW (ref 3.5–5.2)
Alkaline Phosphatase: 109 U/L (ref 39–117)
BUN: 18 mg/dL (ref 6–23)
CO2: 24 mEq/L (ref 19–32)
Calcium: 8.9 mg/dL (ref 8.4–10.5)
Chloride: 101 mEq/L (ref 96–112)
Creatinine, Ser: 0.53 mg/dL (ref 0.50–1.10)
GFR calc Af Amer: >90 mL/min (ref >90)
GFR calc non Af Amer: >90 mL/min (ref >90)
Glucose, Bld: 114 mg/dL — ABNORMAL HIGH (ref 70–99)
Potassium: 4.3 mEq/L (ref 3.7–5.3)
Sodium: 141 mEq/L (ref 137–147)
Total Bilirubin: 0.8 mg/dL (ref 0.3–1.2)
Total Protein: 7.4 g/dL (ref 6.0–8.3)
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
## 2013-10-24 NOTE — Progress Notes (Signed)
Occupational Therapy Note  Patient Details  Name: Judy CumminsShilpa S Gutierrez MRN: [id number] Date of Birth: 1961-02-25 Today's Date: 10/24/2013  Time: [id number] Pt denies pain Individual Therapy  Pt educated on use of reacher, sock aid, and long handle sponge.  Pt return demonstrated use of sock aid without assistance.  Discussed discharge plans.  Pt was able to independently recall RLE TDWB precautions and Lt wrist precautions.    Judy Gutierrez, Judy Gutierrez [facility] LLCChappell 10/24/2013, 12:14 PM

## 2013-10-24 NOTE — Progress Notes (Signed)
Patient information reviewed and entered into eRehab system by Christa Fasig, RN, CRRN, PPS Coordinator.  Information including medical coding and functional independence measure will be reviewed and updated through discharge.    

## 2013-10-25 ENCOUNTER — Inpatient Hospital Stay (HOSPITAL_COMMUNITY): Payer: BC Managed Care – PPO

## 2013-10-25 ENCOUNTER — Encounter (HOSPITAL_COMMUNITY): Payer: BC Managed Care – PPO

## 2013-10-25 NOTE — Progress Notes (Signed)
Physical Therapy Session Note  Patient Details  Name: Judy CumminsShilpa S Waszak MRN: [id number] Date of Birth: 07-May-1961  Today's Date: 10/25/2013 Time:Treatment Session 1: 1000-1100; Treatment Session 2: 1300-1340 Time Calculation (min): Treatment Session 1: 60 min; Treatment Session 2: 40min   Skilled Therapeutic Interventions/Progress Updates:  Treatment Session 1:  1:1. Pt received sitting in w/c, ready for therapy. Pt able to propel w/c 150'x2 this session w/ R UE/L LE supervision to intermittent min A for obstacle negotiation. Discussion regarding stair negotiation as pt will not be able to safely hop up or scoot up on bottom. Introduced idea of w/c ramp vs. Bumping, pt prefers family to be taught bumping at this time. Pt able to amb 25'x2 w/ L PFRW and min guard to close (S). Pt req decreased standing rest breaks during second bout of amb as well as demonstrated improved quality of hopping today vs. Yesterday. Pt performed supine therex for B LE strength/endurance/ROM. Exercises included 1-2x10 reps: ankle pumps, glute sets, quad sets, heel slides, hip abd, SAQ, and LAQ (assisted). Pt req min A for SPT tx mat>w/c and mod A w/ step-by-step verbal cues for seq t/f sup<>sit on tx mat. Pt sitting in w/c at end of session w/ all needs in reach.   Treatment Session 2:  1:1. Pt received sitting in w/c, ready for therapy. Focus this session on functional mobility and transfers in home environment setting. Pt able to propel w/c on hard level and carpeted surfaces w/ R UE/L LE and overall (S). Pt able to amb 20'x2 on carpeted surface w/ L PFRW as well as perform safe transfer to couch, reclining chair and bed in therapy apartment w/ close (S)-intermittent min guard assist. Pt continues to req cueing for seq of bed mobility 2/2 pain w/ min-mod A. Pt also able to perform car t/f w/ min-mod A and mod cues. Pt sitting in w/c at end of session w/ all needs in reach.   LTGs regarding ambulatory distance upgraded 2/2  demonstrated progress.   Therapy Documentation Precautions:  Precautions Precautions: Fall Restrictions Weight Bearing Restrictions: Yes LUE Weight Bearing: Weight bearing as tolerated (at elbow) RLE Weight Bearing: Touchdown weight bearing LLE Weight Bearing: Weight bearing as tolerated Other Position/Activity Restrictions: NWB RLE, WBAT LLE, okay to WB through LUE elbow, NWB through L wrist   Pain: Pain Assessment Pain Assessment: 0-10 Pain Score: 4  Pain Type: Acute pain Pain Location: Hip Pain Orientation: Right;Left Pain Descriptors / Indicators: Aching Pain Intervention(s): Repositioned  See FIM for current functional status  Therapy/Group: Individual Therapy  Denzil HughesKing, Charlane Westry S 10/25/2013, 11:02 AM
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
## 2013-10-25 NOTE — Progress Notes (Signed)
[facility] Individual Statement of Services  Patient Name:  Judy Gutierrez  Date:  10/25/2013  Welcome to the [facility].  Our goal is to provide you with an individualized program based on your diagnosis and situation, designed to meet your specific needs.  With this comprehensive rehabilitation program, you will be expected to participate in at least 3 hours of rehabilitation therapies Monday-Friday, with modified therapy programming on the weekends.  Your rehabilitation program will include the following services:  Physical Therapy (PT), Occupational Therapy (OT), 24 hour per day rehabilitation nursing, Therapeutic Recreaction (TR), Case Management (Social Worker), Rehabilitation Medicine, Nutrition Services and Pharmacy Services  Weekly team conferences will be held on Tuesdays to discuss your progress.  Your Social Worker will talk with you frequently to get your input and to update you on team discussions.  Team conferences with you and your family in attendance may also be held.  Expected length of stay: 7 days  Overall anticipated outcome: modified independent  Depending on your progress and recovery, your program may change. Your Social Worker will coordinate services and will keep you informed of any changes. Your Social Worker's name and contact numbers are listed  below.  The following services may also be recommended but are not provided by the [facility]:   Driving Evaluations  Home Health Rehabiltiation Services  Outpatient Rehabilitation Services  Vocational Rehabilitation   Arrangements will be made to provide these services after discharge if needed.  Arrangements include referral to agencies that provide these services.  Your insurance has been verified to be:  [employer] Your primary doctor is:  Dr. Glade LloydPandey  Pertinent information will be shared with your doctor and your insurance company.  Social Worker:  [place]  Camdan Burdi, [place] [phone] or ([phone]) [phone]   Information discussed with and copy given to patient by: Amada JupiterHOYLE, Marlys Stegmaier, 10/25/2013, 4:44 PM
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
## 2013-10-26 ENCOUNTER — Inpatient Hospital Stay (HOSPITAL_COMMUNITY): Payer: BC Managed Care – PPO | Admitting: *Deleted

## 2013-10-26 DIAGNOSIS — D62 Acute posthemorrhagic anemia: Secondary | ICD-10-CM

## 2013-10-26 DIAGNOSIS — R7309 Other abnormal glucose: Secondary | ICD-10-CM

## 2013-10-26 DIAGNOSIS — M25529 Pain in unspecified elbow: Secondary | ICD-10-CM

## 2013-10-26 DIAGNOSIS — IMO0002 Reserved for concepts with insufficient information to code with codable children: Secondary | ICD-10-CM

## 2013-10-26 LAB — URINE CULTURE: Colony Count: >=100000

## 2013-10-26 NOTE — Progress Notes (Signed)
Physical Therapy Session Note  Patient Details  Name: Judy Gutierrez MRN: [id number] Date of Birth: November 04, 1960  Today's Date: 10/26/2013 Time:  -  8:30-9:00 (30min) - (30min missed due to wanting to eat breakfast, not got schedule, made up min later in morning) 11:40-12:10 (30min) and 14:15-15:00 (45min)     Short Term Goals: Week 1:  PT Short Term Goal 1 (Week 1): STGs=LTGs  Skilled Therapeutic Interventions/Progress Updates:    1:3 Tx focused on WC propulsion, gait with PFRW, and therex for LE strength and ROM. Spouse present, but pt wanting to wait until tomorrow to do WC bump when rest of family assisting will be present.  [facility], wanting to eat breakfast before therapy, attempted rearranging schedule.  Pt propelled WC 2x150' with S and cues for steering techniuque.  Sit<>stand with Min A Gait x32' with PFRW and min-guard A with hopping technique. Pt told she was able to touch town RLE, but chooses to keep it NWB during gait. Second gait x25' with min A and improved TTWB technique, less hard hopping.  Stand-step transfer with RW WC<>mat with min A/ close  Seated therex including 2x10 of each of the following: ankle pumps, LAQ, and marching bilerally  2:3 Pt up in WC, propelled WC<>gym with S and safe technique. Pt needing instruction for WC parts management. Instructed pt in supine HEP per handout, 2x10 with assist prn and cues for technique. Pt needing bolster at knees for comfort and maxislide for assist. Pt unable to tolerate flat supine position.   Stand-step transfer with RW and min-guard/S [facility] needs in reach for lunch.   3:3 Pt up in WC, propelled WC<>gym.  Gait in controlled setting x20' with min-guard, limited by pt fatigue from long day.  Nustep x4315min, level 3, with bil LEs and RUE only, pt being careful to apply no pressure to RLE, allowing for passive ROM only on that side. Pt had no difficulty and reporting that the activity felt good to  "loosen" up the muscles and joints.  Transfers with min-guard/S with RW. WC<>toilet transfer with min-guard and safe technique noted.  Pt left up in recliner with all needs in reach.   Therapy Documentation Precautions:  Precautions Precautions: Fall Restrictions Weight Bearing Restrictions: Yes LUE Weight Bearing: Weight bearing as tolerated (at elbow) RLE Weight Bearing: Touchdown weight bearing LLE Weight Bearing: Weight bearing as tolerated Other Position/Activity Restrictions: NWB RLE, WBAT LLE, okay to WB through LUE elbow, NWB through L wrist    Vital Signs: Therapy Vitals Temp: 98.4 F (36.9 C) Temp src: Oral Pulse Rate: 84 Resp: 18 BP: 115/77 mmHg Patient Position, if appropriate: Lying Oxygen Therapy SpO2: 97 % O2 Device: None (Room air) Pain: no complaints     See FIM for current functional status  Therapy/Group: Individual Therapy [name], PT, DPT   10/26/2013, 8:55 AM
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
## 2013-10-26 NOTE — Progress Notes (Signed)
Occupational Therapy Note  Patient Details  Name: Judy Gutierrez MRN: [id number] Date of Birth: 03/06/1961 Today's Date: 10/26/2013  Time:0900-1000   1st session Pain:none Individual session 1st session: Engaged in functional transfers, sit to atand, standing balance, AE use.  Pt. In wc upon OT arrival.  Husband, AJ present.  Pt. Elected to shower.  Did transfer from wc to shower>< with minimal assist and min cues for TDWB on right leg.  Pt stood and washed peri area with minimal assist.  Dressed at sink with encouragement to use AE.   Pt. Kept stating she could not use AE.   Needed assist donning pants on feet and shoes.  Pt. Left in room with nursing administering medications.  Time: 1330-1400  (30 min) Pain:none Individual session   Addressed functional mobility, sit to stand, standing balance during functional activity.  Pt. Ambulated with RW from room to OT kitchen  With minimal assist and wc being rolled behind.  She using RW and stood at sink and loaded dishwasher and turned on dishwasher with counter for support.  She Ambulated to sofa and went >< with minimal assist.  Demonstrated transfer to shower, but did not have time to practice. Pt. Ambulated back to room with RW  Transferred to>< toilet and managed clothes with minimal assist.  Left in recliner with call bell within reach.   Humberto Sealsdwards, Colm Lyford J 10/26/2013, 10:10 AM

## 2013-10-27 ENCOUNTER — Inpatient Hospital Stay (HOSPITAL_COMMUNITY): Payer: BC Managed Care – PPO | Admitting: *Deleted

## 2013-10-27 MED ORDER — CIPROFLOXACIN HCL 250 MG PO TABS
250.0000 mg | ORAL_TABLET | Freq: Two times a day (BID) | ORAL | Status: DC
  Administered 2013-10-27 – 2013-10-29 (×4): 250 mg via ORAL
  Filled 2013-10-27 (×6): qty 1
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
## 2013-10-27 NOTE — Progress Notes (Signed)
Occupational Therapy Note  Patient Details  Name: Judy Gutierrez MRN: [id number] Date of Birth: 1960-12-15 Today's Date: 10/27/2013  Time:  [id number]  (45 min) Pain: none Individual session    Pt. In wc upon OT arrival.  Pt. Gathered supplies at wc level; husband assisted as well.  Addressed functional mobility with RW to shower, transfers, AE use, and instruction.  Pt ambulated to shower with minimal assistance using RW.  Pt transferred to shower bench.  Pt. Used LH sponge for LB.  Pt. did sit to stand with miin assist x3 during shower.  [facility] and dressed [facility].  Pt used AE for LB dressing with instructional cues.  Left pt in wc   call bell,phone within reach.  Husband in room as well.        Humberto Sealsdwards, [place] J 10/27/2013, 8:26 AM

## 2013-10-28 ENCOUNTER — Inpatient Hospital Stay (HOSPITAL_COMMUNITY): Payer: BC Managed Care – PPO | Admitting: Occupational Therapy

## 2013-10-28 ENCOUNTER — Encounter (HOSPITAL_COMMUNITY): Payer: BC Managed Care – PPO | Admitting: Occupational Therapy

## 2013-10-28 ENCOUNTER — Inpatient Hospital Stay (HOSPITAL_COMMUNITY): Payer: BC Managed Care – PPO

## 2013-10-28 DIAGNOSIS — S52209A Unspecified fracture of shaft of unspecified ulna, initial encounter for closed fracture: Secondary | ICD-10-CM

## 2013-10-28 DIAGNOSIS — S329XXA Fracture of unspecified parts of lumbosacral spine and pelvis, initial encounter for closed fracture: Secondary | ICD-10-CM

## 2013-10-28 DIAGNOSIS — S52309A Unspecified fracture of shaft of unspecified radius, initial encounter for closed fracture: Secondary | ICD-10-CM

## 2013-10-28 NOTE — Progress Notes (Signed)
Occupational Therapy Discharge Summary  Patient Details  Name: Judy Gutierrez MRN: [id number] Date of Birth: 1961/01/30  Today's Date: 10/28/2013 Time: 0730-0825 Time Calculation (min): 55 min  1:1 GRAD DAY! Self care retraining at shower level with focus on functional ambulation with platform RW, sit to stands, standing balance with and without UE support, use of AE for LB dressing prn for success, shower stall transfer with PFRW hopping over threshold, functional w/c mobility.   2nd session 1130-12 1:1 Discussed elastic shoe laces as an option for successful donning shoes. Pt issued these shoe laces. Pt able to don shoes with setup. Continued practice for shower stall transfers- hopping over the threshold backwards with PFRW.   Patient has met 7 of 7 long term goals due to improved activity tolerance, improved balance, postural control and improved coordination.  Patient to discharge at overall Supervision level.  Patient's care partner, husband is independent to provide the necessary physical assistance at discharge.  Pt plans to be carried to the upstairs bedroom and stay there so she can better access her bathroom. Pt's aunt and uncle will be there to assist as needed.   Reasons goals not met: n/a  Recommendation:  No OT f/u.   Equipment: 3:1, w/c with cushion, shower chair, PFRW  Reasons for discharge: treatment goals met and discharge from hospital  Patient/family agrees with progress made and goals achieved: Yes  OT Discharge Precautions/Restrictions  Precautions Precautions: Fall Restrictions Weight Bearing Restrictions: Yes LUE Weight Bearing: Non weight bearing RLE Weight Bearing: Touchdown weight bearing LLE Weight Bearing: Weight bearing as tolerated Other Position/Activity Restrictions: NWB RLE, WBAT LLE, okay to WB through LUE elbow, NWB through L wrist Pain Pain Assessment Pain Assessment: No/denies pain ADL  See FIM Vision/Perception  Vision -  History Baseline Vision: Wears glasses only for reading Patient Visual Report: Blurring of vision Perception Perception: Within Functional Limits Praxis Praxis: Intact  Cognition Overall Cognitive Status: Within Functional Limits for tasks assessed Arousal/Alertness: Awake/alert Orientation Level: Oriented X4 Attention: Selective;Alternating Selective Attention: Appears intact Alternating Attention: Appears intact Memory: Appears intact Awareness: Appears intact Problem Solving: Appears intact Safety/Judgment: Appears intact Sensation Sensation Light Touch: Appears Intact Hot/Cold: Appears Intact Proprioception: Appears Intact Additional Comments: Pt reports mild N/T in R LE during TTWB Coordination Gross Motor Movements are Fluid and Coordinated: Yes Fine Motor Movements are Fluid and Coordinated: Yes Coordination and Movement Description: 2/2 pain and WB precautions Motor  Motor Motor: Within Functional Limits Motor - Discharge Observations: pain better managed  Mobility  Bed Mobility Bed Mobility: Supine to Sit;Sit to Supine Supine to Sit: 6: Modified independent (Device/Increase time) Supine to Sit Details: Verbal cues for sequencing;Verbal cues for technique Supine to Sit Details (indicate cue type and reason): Req increased time 2/2 pain Sit to Supine: 6: Modified independent (Device/Increase time) Sit to Supine - Details: Verbal cues for technique Transfers Transfers: Sit to Stand;Stand to Sit Sit to Stand: 5: Supervision Sit to Stand Details: Verbal cues for precautions/safety Stand to Sit: 5: Supervision Stand to Sit Details (indicate cue type and reason): Verbal cues for precautions/safety  Trunk/Postural Assessment  Cervical Assessment Cervical Assessment: Within Functional Limits Thoracic Assessment Thoracic Assessment: Within Functional Limits Lumbar Assessment Lumbar Assessment: Within Functional Limits Postural Control Postural Control: Within  Functional Limits  Balance Balance Balance Assessed: Yes Static Sitting Balance Static Sitting - Balance Support: Right upper extremity supported;Feet supported;No upper extremity supported;Left upper extremity supported;Bilateral upper extremity supported Static Sitting - Level of Assistance: 6: Modified independent (  Device/Increase time) Dynamic Sitting Balance Dynamic Sitting - Balance Support: Left upper extremity supported;Bilateral upper extremity supported;Feet supported Dynamic Sitting - Level of Assistance: 6: Modified independent (Device/Increase time) Dynamic Sitting - Balance Activities: Lateral lean/weight shifting;Forward lean/weight shifting;Reaching for objects;Reaching across midline Static Standing Balance Static Standing - Balance Support: Bilateral upper extremity supported Static Standing - Level of Assistance: 5: Stand by assistance Dynamic Standing Balance Dynamic Standing - Balance Support: Bilateral upper extremity supported;Left upper extremity supported Dynamic Standing - Level of Assistance: 5: Stand by assistance Dynamic Standing - Balance Activities: Lateral lean/weight shifting;Forward lean/weight shifting;Reaching for objects Extremity/Trunk Assessment RUE Assessment RUE Assessment: Within Functional Limits LUE Assessment LUE Assessment: Exceptions to [facility]  See FIM for current functional status  Willeen Cass [facility] 10/28/2013, 11:24 AM
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
## 2013-10-28 NOTE — Discharge Instructions (Signed)
Inpatient Rehab Discharge Instructions  Verdon CumminsShilpa S Cooksey Discharge date and time:  10/29/13  Activities/Precautions/ Functional Status: Activity: activity as tolerated--Touch down weight on right leg.  No weight on left wrist and left forearm.  Diet: regular diet Wound Care: none needed  Functional status:  ___ No restrictions     ___ Walk up steps independently _X__ 24/7 supervision/assistance   ___ Walk up steps with assistance ___ Intermittent supervision/assistance  ___ Bathe/dress independently _X__ Walk with walker     ___ Bathe/dress with assistance ___ Walk Independently    ___ Shower independently ___ Walk with assistance    ___ Shower with assistance _X__ No alcohol     ___ Return to work/school ________   COMMUNITY REFERRALS UPON DISCHARGE:    Home Health:   PT                 [facility] Phone: [phone]   Medical Equipment/Items Ordered: wheelchair, cushion, walker, 3n1 commode and tub seat                                                     Agency/Supplier: [facility] @ [phone]       Special Instructions:    My questions have been answered and I understand these instructions. I will adhere to these goals and the provided educational materials after my discharge from the hospital.  Patient/Caregiver Signature _______________________________ Date __________  Clinician Signature _______________________________________ Date __________  Please bring this form and your medication list with you to all your follow-up doctor's appointments.

## 2013-10-28 NOTE — Progress Notes (Signed)
Physical Therapy Discharge Summary  Patient Details  Name: Judy Gutierrez MRN: [id number] Date of Birth: December 28, 1960  Today's Date: 10/28/2013 Time: Treatment Session 1: 7829-5621; Treatment Session 2: 3086-5784 Time Calculation (min): Treatment Session 1: 60 min;Treatment Session 2: 44mn  Patient has met 7 of 8 long term goals due to improved activity tolerance, improved balance, increased strength, decreased pain, ability to compensate for deficits, functional use of  left lower extremity and improved coordination.  Patient to discharge at combination of w/c and short distance amb w/ L PFRW level Mod(I) for w/c propulsion, Supervision during standing mobility and transfers as well as intermittent min A during bed mobility.   Patient's care partner is independent to provide the necessary physical assistance at discharge.  Reasons goals not met: Although pt able to perform t/f sit>sup w/ superivison, pt continues to benefit from intermittent min A during t/f sup>sit 2/2 pain.   Recommendation:  Patient will benefit from ongoing skilled PT services in home health setting to continue to advance safe functional mobility, address ongoing impairments in decreased functional endurance, decreased balance, decreased strength, and minimize fall risk.  Equipment: Wheelchair, L PFRW to maximize level of independence during functional mobility as well as decrease fall risk and burden of care on caregiver.  Reasons for discharge: treatment goals met and discharge from hospital  Patient/family agrees with progress made and goals achieved: Yes  Skilled Therapeutic Interventions Treatment Session 1:  1:1. Pt received sitting in w/c, ready for therapy. Pt able to amb in room w/c<>bathroom and toileting w/ overall (S). Pt able to amb additional 50' w/ L PFRW and (S), demonstrating consistent clearance of L LE during hopping only req intermittent cues to decrease speed for safety. Pt able to accurately  demonstrate 1x10 reps of supine exercises as well as correctly verbalize seated exercises in HEP. Therapist verbally educating as well as demonstrated w/c bumping technique and carry technique for home accessibility. Encouraged pt to have husband present in PM session for teaching of these techniques. Pt able to propel w/c >150' throughout session, mod(I). Pt sitting in w/c at end of session w/ all needs in reach.  Treatment Session 2:  1:1. Pt received sitting in w/c, ready for therapy w/ husband present for family training. Pt and husband verbally educated on progress, goals/benefits of HHPT especially for accessibility to second floor in home. Pt and husband verbalized that they would not attempt going to second floor until guidance from [employer] Pt's husband instructed on w/c bumping for accessibility into home trial up/down 2 steps, pt and husband verblized understanding of technique and Ax2 persons needed for safety. Pt's husband providing pt w/ appropriate (S) during amb w/ L PFRW and car transfer. Pt able to guide husband for assistance w/ bed mobility as needed. Pt's DME delivered to room and adjusted to appropriate heights. Pt's husband cleared to assist pt w/ mobility in room. Pt sitting in w/c at end of session w/ all needs in reach and husband in room.   PT Discharge Precautions/Restrictions Precautions Precautions: Fall Restrictions Weight Bearing Restrictions: Yes LUE Weight Bearing: Non weight bearing (L wrist) RLE Weight Bearing: Touchdown weight bearing LLE Weight Bearing: Weight bearing as tolerated Other Position/Activity Restrictions: NWB RLE, WBAT LLE, okay to WB through LUE elbow, NWB through L wrist Vital Signs Therapy Vitals Temp: 98.3 F (36.8 C) Temp src: Oral Pulse Rate: 93 Resp: 18 BP: 149/72 mmHg Patient Position, if appropriate: Lying Oxygen Therapy SpO2: 100 % O2 Device: None (Room air)  Pain Pain Assessment Pain Assessment: No/denies pain Vision/Perception   Vision - History Baseline Vision: Wears glasses only for reading Patient Visual Report: Blurring of vision Perception Perception: Within Functional Limits Praxis Praxis: Intact  Cognition Overall Cognitive Status: Within Functional Limits for tasks assessed Arousal/Alertness: Awake/alert Orientation Level: Oriented X4 Attention: Selective;Alternating Selective Attention: Appears intact Alternating Attention: Appears intact Memory: Appears intact Awareness: Appears intact Problem Solving: Appears intact Safety/Judgment: Appears intact Sensation Sensation Light Touch: Appears Intact Proprioception: Appears Intact Additional Comments: Pt reports mild N/T in R LE during TTWB Coordination Gross Motor Movements are Fluid and Coordinated: No Fine Motor Movements are Fluid and Coordinated: Yes Coordination and Movement Description: 2/2 pain and WB precautions Motor  Motor Motor: Within Functional Limits  Mobility Bed Mobility Bed Mobility: Supine to Sit;Sit to Supine Supine to Sit: 4: Min A Supine to Sit Details: Verbal cues for technique Sit to Supine: 5: Supervision Sit to Supine - Details: Verbal cues for technique Transfers Transfers: Yes Sit to Stand: 5: Supervision Sit to Stand Details: Verbal cues for precautions/safety Stand to Sit: 5: Supervision Stand to Sit Details (indicate cue type and reason): Verbal cues for precautions/safety Stand Pivot Transfers: 5: Supervision Stand Pivot Transfer Details: Verbal cues for precautions/safety Locomotion  Ambulation Ambulation: Yes Ambulation/Gait Assistance: 5: Supervision Ambulation Distance (Feet): 50 Feet Assistive device: Left platform walker Ambulation/Gait Assistance Details: Verbal cues for precautions/safety;Verbal cues for gait pattern Ambulation/Gait Assistance Details: intermittent cues to decrease speed of hopping for improved quality and safety Gait Gait: Yes Gait Pattern: Impaired Gait velocity: small  hop w/ L LE, improved quality of foot clearance w/ use of shoes vs. socks Stairs / Additional Locomotion Stairs: No (Pt unable to safely negotiate steps 2/2 WB precautions) Wheelchair Mobility Wheelchair Mobility: Yes Wheelchair Assistance: 6: Modified independent (Device/Increase time) Wheelchair Propulsion: Right upper extremity;Left lower extremity Wheelchair Parts Management: Needs assistance Distance: 200'  Trunk/Postural Assessment  Cervical Assessment Cervical Assessment: Within Functional Limits Thoracic Assessment Thoracic Assessment: Within Functional Limits Lumbar Assessment Lumbar Assessment: Within Functional Limits Postural Control Postural Control: Within Functional Limits  Balance Balance Balance Assessed: Yes Static Sitting Balance Static Sitting - Balance Support: Right upper extremity supported;Feet supported;No upper extremity supported;Left upper extremity supported;Bilateral upper extremity supported Static Sitting - Level of Assistance: 6: Modified independent (Device/Increase time) Dynamic Sitting Balance Dynamic Sitting - Balance Support: Left upper extremity supported;Bilateral upper extremity supported;Feet supported Dynamic Sitting - Level of Assistance: 6: Modified independent (Device/Increase time) Dynamic Sitting - Balance Activities: Lateral lean/weight shifting;Forward lean/weight shifting;Reaching for objects;Reaching across midline Static Standing Balance Static Standing - Balance Support: Bilateral upper extremity supported Static Standing - Level of Assistance: 5: Stand by assistance Dynamic Standing Balance Dynamic Standing - Balance Support: Bilateral upper extremity supported;Left upper extremity supported Dynamic Standing - Level of Assistance: 5: Stand by assistance Dynamic Standing - Balance Activities: Lateral lean/weight shifting;Forward lean/weight shifting;Reaching for objects Extremity Assessment      RLE Assessment RLE  Assessment: Exceptions to [facility] RLE Strength RLE Overall Strength Comments: Not formally assessed 2/2 pain/precautions. Improved tolerance to AROM 2/2 decreased pain. Pt demonstrates fair-good functional strength LLE Assessment LLE Assessment: Exceptions to [facility] LLE Strength LLE Overall Strength Comments: Not formally assessed 2/2 pain. Improved tolerance to AROM 2/2 decreased pain. Pt demonstrate good functional strength.   See FIM for current functional status  Gilmore Laroche 10/28/2013, 3:54 PM

## 2013-10-29 DIAGNOSIS — S329XXA Fracture of unspecified parts of lumbosacral spine and pelvis, initial encounter for closed fracture: Secondary | ICD-10-CM

## 2013-10-29 DIAGNOSIS — S52309A Unspecified fracture of shaft of unspecified radius, initial encounter for closed fracture: Secondary | ICD-10-CM

## 2013-10-29 DIAGNOSIS — S52209A Unspecified fracture of shaft of unspecified ulna, initial encounter for closed fracture: Secondary | ICD-10-CM

## 2013-10-29 MED ORDER — CIPROFLOXACIN HCL 250 MG PO TABS: 250.0000 mg | ORAL_TABLET | Freq: Two times a day (BID) | ORAL | Status: DC

## 2013-10-29 MED ORDER — METHOCARBAMOL 500 MG PO TABS: 500.0000 mg | ORAL_TABLET | Freq: Four times a day (QID) | ORAL | Status: DC | PRN

## 2013-10-29 MED ORDER — ASPIRIN EC 325 MG PO TBEC: 325.0000 mg | DELAYED_RELEASE_TABLET | Freq: Every day | ORAL | Status: DC

## 2013-10-29 MED ORDER — SENNOSIDES-DOCUSATE SODIUM 8.6-50 MG PO TABS: 1.0000 | ORAL_TABLET | Freq: Every day | ORAL | Status: DC

## 2013-10-29 MED ORDER — ACETAMINOPHEN 325 MG PO TABS: 325.0000 mg | ORAL_TABLET | ORAL | Status: DC | PRN

## 2013-10-29 NOTE — Progress Notes (Signed)
Social Work  Discharge Note  The overall goal for the admission was met for:   Discharge location: Yes - home with husband, aunt and uncle to provide 24/7 assistance  Length of Stay: Yes - 6 days  Discharge activity level: Yes - minimal assist overall  Home/community participation: Yes  Services provided included: MD, RD, PT, OT, SLP, RN, TR, Pharmacy and SW  Financial Services: Private Insurance: BCBS  Follow-up services arranged: Home Health: PT via [facility], DME: 18x16 lightweight w/c, cushion, youth rolling walker with left platform attachment, tub seat, 3n1 commode all via [facility] and Patient/Family has no preference for HH/DME agencies  Comments (or additional information):  Patient/Family verbalized understanding of follow-up arrangements: Yes  Individual responsible for coordination of the follow-up plan: patient  Confirmed correct DME delivered: HOYLE, LUCY 10/29/2013    HOYLE, LUCY 
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
## 2013-10-29 NOTE — Progress Notes (Signed)
Pt. Got d/c instructions and follow up appointments.Pt. Ready to go home with her husband. 
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
## 2013-11-01 DIAGNOSIS — N390 Urinary tract infection, site not specified: Secondary | ICD-10-CM | POA: Diagnosis present

## 2013-11-01 DIAGNOSIS — B9620 Unspecified Escherichia coli [E. coli] as the cause of diseases classified elsewhere: Secondary | ICD-10-CM | POA: Diagnosis present

## 2013-11-05 ENCOUNTER — Encounter: Payer: Self-pay | Admitting: Internal Medicine

## 2013-11-05 ENCOUNTER — Ambulatory Visit (INDEPENDENT_AMBULATORY_CARE_PROVIDER_SITE_OTHER): Payer: BC Managed Care – PPO | Admitting: Internal Medicine

## 2013-11-05 VITALS — BP 122/78 | HR 83 | Temp 98.6°F | Wt 136.0 lb

## 2013-11-05 DIAGNOSIS — S52202A Unspecified fracture of shaft of left ulna, initial encounter for closed fracture: Secondary | ICD-10-CM | POA: Insufficient documentation

## 2013-11-05 DIAGNOSIS — B9620 Unspecified Escherichia coli [E. coli] as the cause of diseases classified elsewhere: Secondary | ICD-10-CM

## 2013-11-05 DIAGNOSIS — S52209A Unspecified fracture of shaft of unspecified ulna, initial encounter for closed fracture: Secondary | ICD-10-CM

## 2013-11-05 DIAGNOSIS — A498 Other bacterial infections of unspecified site: Secondary | ICD-10-CM

## 2013-11-05 DIAGNOSIS — D62 Acute posthemorrhagic anemia: Secondary | ICD-10-CM

## 2013-11-05 DIAGNOSIS — S329XXA Fracture of unspecified parts of lumbosacral spine and pelvis, initial encounter for closed fracture: Secondary | ICD-10-CM

## 2013-11-05 DIAGNOSIS — N390 Urinary tract infection, site not specified: Secondary | ICD-10-CM
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
## 2013-11-06 LAB — CBC WITH DIFFERENTIAL/PLATELET
Basophils Absolute: 0 10*3/uL (ref 0.0–0.2)
Basos: 0 %
Eos: 2 %
Eosinophils Absolute: 0.1 10*3/uL (ref 0.0–0.4)
HCT: 33.8 % — ABNORMAL LOW (ref 34.0–46.6)
Hemoglobin: 11.3 g/dL (ref 11.1–15.9)
Immature Grans (Abs): 0 10*3/uL (ref 0.0–0.1)
Immature Granulocytes: 0 %
Lymphocytes Absolute: 2 10*3/uL (ref 0.7–3.1)
Lymphs: 40 %
MCH: 28.3 pg (ref 26.6–33.0)
MCHC: 33.4 g/dL (ref 31.5–35.7)
MCV: 85 fL (ref 79–97)
Monocytes Absolute: 0.3 10*3/uL (ref 0.1–0.9)
Monocytes: 5 %
Neutrophils Absolute: 2.6 10*3/uL (ref 1.4–7.0)
Neutrophils Relative %: 53 %
RBC: 4 x10E6/uL (ref 3.77–5.28)
RDW: 14 % (ref 12.3–15.4)
WBC: 4.9 10*3/uL (ref 3.4–10.8)

## 2013-11-13 ENCOUNTER — Other Ambulatory Visit: Payer: Self-pay

## 2013-11-13 DIAGNOSIS — Z1231 Encounter for screening mammogram for malignant neoplasm of breast: Secondary | ICD-10-CM

## 2013-12-19 ENCOUNTER — Ambulatory Visit
Admission: RE | Admit: 2013-12-19 | Discharge: 2013-12-19 | Disposition: A | Discharge status: Home / Self Care | Payer: BC Managed Care – PPO | Source: Ambulatory Visit

## 2013-12-19 DIAGNOSIS — Z1231 Encounter for screening mammogram for malignant neoplasm of breast: Secondary | ICD-10-CM

## 2014-02-12 ENCOUNTER — Ambulatory Visit (INDEPENDENT_AMBULATORY_CARE_PROVIDER_SITE_OTHER): Payer: BC Managed Care – PPO | Admitting: Internal Medicine

## 2014-02-12 ENCOUNTER — Encounter: Payer: Self-pay | Admitting: Internal Medicine

## 2014-02-12 VITALS — BP 140/86 | HR 80 | Temp 97.8°F | Wt 137.4 lb

## 2014-02-12 DIAGNOSIS — M79609 Pain in unspecified limb: Secondary | ICD-10-CM

## 2014-02-12 DIAGNOSIS — Z23 Encounter for immunization: Secondary | ICD-10-CM

## 2014-02-12 DIAGNOSIS — M79643 Pain in unspecified hand: Secondary | ICD-10-CM

## 2014-02-12 MED ORDER — TETANUS-DIPHTH-ACELL PERTUSSIS 5-2-15.5 LF-MCG/0.5 IM SUSP: 0.5000 mL | Freq: Once | INTRAMUSCULAR | Status: DC
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
## 2014-06-26 ENCOUNTER — Telehealth: Payer: Self-pay | Admitting: *Deleted

## 2014-06-26 NOTE — Telephone Encounter (Signed)
What kind of dietary supplement does the patient want?

## 2014-06-26 NOTE — Telephone Encounter (Signed)
Patient called and wanted to know if she can take  Dietary Supplements. Please Advise.

## 2014-06-30 NOTE — Telephone Encounter (Signed)
Ab Cuts Dietary Supplement. Bought it from [employer].

## 2014-07-01 NOTE — Telephone Encounter (Signed)
i am not aware about the composition of the dietary supplement ab cut. Thus i will not be able to recommend it

## 2014-07-02 NOTE — Telephone Encounter (Signed)
Patient Notified

## 2014-09-10 ENCOUNTER — Encounter: Payer: Self-pay | Admitting: Internal Medicine

## 2014-09-10 ENCOUNTER — Ambulatory Visit (INDEPENDENT_AMBULATORY_CARE_PROVIDER_SITE_OTHER): Payer: BC Managed Care – PPO | Admitting: *Deleted

## 2014-09-10 ENCOUNTER — Ambulatory Visit (INDEPENDENT_AMBULATORY_CARE_PROVIDER_SITE_OTHER): Payer: BC Managed Care – PPO | Admitting: Internal Medicine

## 2014-09-10 VITALS — BP 138/96 | Temp 98.4°F | Wt 136.0 lb

## 2014-09-10 DIAGNOSIS — R0789 Other chest pain: Secondary | ICD-10-CM

## 2014-09-10 DIAGNOSIS — R030 Elevated blood-pressure reading, without diagnosis of hypertension: Secondary | ICD-10-CM

## 2014-09-10 DIAGNOSIS — Z23 Encounter for immunization: Secondary | ICD-10-CM

## 2014-09-10 DIAGNOSIS — IMO0001 Reserved for inherently not codable concepts without codable children: Secondary | ICD-10-CM

## 2014-09-10 MED ORDER — NAPROXEN 500 MG PO TABS: 500.0000 mg | ORAL_TABLET | Freq: Two times a day (BID) | ORAL | Status: DC

## 2014-09-10 NOTE — Progress Notes (Signed)
Patient ID: Judy CumminsShilpa S Brandhorst, female   DOB: [birth year], 53 y.o.   MRN: [id number]    Chief Complaint  Patient presents with  . Breast Pain    right breast for one week sore area "like a  pulled muscle"   No Known Allergies  HPI 53 y/o female pt is here for acute concern. She has been having pain on right side of her chest for a week. She goes to the Gym regularly. She does her weights and tension bands. She has been moving boxes in her store.  She denies trouble breathing Negative for palpitations, headache Pain is 4-6/10, has not tried anything for pain Pain is reproducible for her Denies radiation of the pain No fever or chills  Denies heartburn, nausea or vomiting  ROS Negative except for hpi Denies any trauma/ falls Denies focal weakness, numbness or tingling  Past Medical History  Diagnosis Date  . Myalgia and myositis, unspecified   . Other B-complex deficiencies   . Unspecified essential hypertension   . 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 Disturbance of skin sensation    Current Outpatient Prescriptions on File Prior to Visit  Medication Sig Dispense Refill  . calcium carbonate (OS-CAL) 600 MG TABS Take 600 mg by mouth daily.    . Multiple Vitamins-Minerals (MULTIVITAMIN WITH MINERALS) tablet Take 1 tablet by mouth daily.     No current facility-administered medications on file prior to visit.    Physical exam BP 138/96 mmHg  Temp(Src) 98.4 F (36.9 C) (Oral)  Wt 136 lb (61.689 kg)  LMP 12/08/2010  General- adult female in no acute distress Head- atraumatic, normocephalic Eyes- PERRLA, EOMI, no pallor, no icterus, no discharge Neck- no lymphadenopathy Chest- no chest wall deformities, right medial chest wall tenderness on palpation Breast- no masses, no palpable lumps, normal nipple and areola exam, no axillary lymphadenopathy Cardiovascular- normal s1,s2, no murmurs/ rubs/ gallops, normal distal pulses Respiratory- bilateral clear to  auscultation, no wheeze, no rhonchi, no crackles Abdomen- bowel sounds present, soft, non tender Musculoskeletal- able to move all 4 extremities, no spinal and paraspinal tenderness, steady gait Skin- warm and dry Psychiatry- alert and oriented to person, place and time, normal mood and affect  Assessment/plan  1. Musculoskeletal chest pain Reproducible chest pain. Will have her on naprosyn 500 mg bid x 5 days then prn. Advised on mild intensity exercise until pain gets better.   2. Elevated blood pressure bp 150/100 on arrival and repeat was 138/96. No associated symptoms. Pt mentions rushing to the clinic. Advised on bp check at home and to reassess if remains elevated. Warning signs with elevated bp explained

## 2014-12-17 ENCOUNTER — Other Ambulatory Visit: Payer: Self-pay

## 2014-12-17 DIAGNOSIS — Z1231 Encounter for screening mammogram for malignant neoplasm of breast: Secondary | ICD-10-CM

## 2014-12-23 ENCOUNTER — Ambulatory Visit
Admission: RE | Admit: 2014-12-23 | Discharge: 2014-12-23 | Disposition: A | Discharge status: Home / Self Care | Payer: BLUE CROSS/BLUE SHIELD | Source: Ambulatory Visit

## 2014-12-23 ENCOUNTER — Encounter (INDEPENDENT_AMBULATORY_CARE_PROVIDER_SITE_OTHER): Payer: Self-pay

## 2014-12-23 DIAGNOSIS — Z1231 Encounter for screening mammogram for malignant neoplasm of breast: Secondary | ICD-10-CM

## 2015-09-04 ENCOUNTER — Encounter: Payer: Self-pay | Admitting: Internal Medicine

## 2015-09-04 ENCOUNTER — Ambulatory Visit (INDEPENDENT_AMBULATORY_CARE_PROVIDER_SITE_OTHER): Payer: BLUE CROSS/BLUE SHIELD | Admitting: Internal Medicine

## 2015-09-04 VITALS — BP 130/88 | HR 83 | Temp 98.3°F | Resp 18 | Ht 61.0 in | Wt 139.4 lb

## 2015-09-04 DIAGNOSIS — IMO0001 Reserved for inherently not codable concepts without codable children: Secondary | ICD-10-CM

## 2015-09-04 DIAGNOSIS — Z23 Encounter for immunization: Secondary | ICD-10-CM

## 2015-09-04 DIAGNOSIS — R030 Elevated blood-pressure reading, without diagnosis of hypertension: Secondary | ICD-10-CM | POA: Diagnosis not present

## 2015-09-04 DIAGNOSIS — Z0000 Encounter for general adult medical examination without abnormal findings: Secondary | ICD-10-CM

## 2015-09-04 NOTE — Patient Instructions (Addendum)
cologuard test for colon cancer screening  Continue current medications as ordered  Encouraged her to exercise 30-45 minutes 4-5 times per week. Eat a well balanced diet. Avoid smoking. Limit alcohol intake. Wear seatbelt when riding in the car. Wear sun block (SPF >50) when spending extended times outside.   Follow up with GYN for routine GYN care  Follow up in 1 yr for CPE. Return to office for fasting labs.  Flu shot given today

## 2015-09-04 NOTE — Progress Notes (Addendum)
Patient ID: Judy Gutierrez, female   DOB: [birth year], 54 y.o.   MRN: [id number] Subjective:     Judy Gutierrez is a 54 y.o. female and is here for a comprehensive physical exam. The patient reports no problems. She is followed by GYN. Last pap in 2014. Had mammogram in March 2016. She never followed through with colonoscopy after her initial visit. She needs flu shot today.  She reports increased stressors at work today and BP elevated. She exercises as tolerated and attempts to make healthy food choices  Past Medical History  Diagnosis Date  . Myalgia and myositis, unspecified   . Other B-complex deficiencies   . Unspecified essential hypertension   . 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 Disturbance of skin sensation    Past Surgical History  Procedure Laterality Date  . Cesarean section  1992   History reviewed. No pertinent family history.  Social History   Social History  . Marital Status: Married    Spouse Name: N/A  . Number of Children: N/A  . Years of Education: N/A   Occupational History  . Not on file.   Social History Main Topics  . Smoking status: Never Smoker   . Smokeless tobacco: Never Used  . Alcohol Use: No  . Drug Use: No  . Sexual Activity: Yes   Other Topics Concern  . Not on file   Social History Narrative   Health Maintenance  Topic Date Due  . Hepatitis C Screening  [birth year]  . HIV Screening  08/26/1976  . TETANUS/TDAP  08/26/1980  . INFLUENZA VACCINE  05/18/2015  . PAP SMEAR  02/15/2016  . MAMMOGRAM  12/22/2016  . COLONOSCOPY  09/27/2022   Current Outpatient Prescriptions on File Prior to Visit  Medication Sig Dispense Refill  . calcium carbonate (OS-CAL) 600 MG TABS Take 600 mg by mouth daily.    . Multiple Vitamins-Minerals (MULTIVITAMIN WITH MINERALS) tablet Take 1 tablet by mouth daily.     No current facility-administered medications on file prior to visit.     Review of Systems   Review of Systems   Constitutional: Negative for fever, chills and malaise/fatigue.  HENT: Negative for sore throat and tinnitus.   Eyes: Negative for blurred vision and double vision.  Respiratory: Negative for cough, shortness of breath and wheezing.   Cardiovascular: Negative for chest pain, palpitations, orthopnea and leg swelling.  Gastrointestinal: Negative for heartburn, nausea, vomiting, abdominal pain, diarrhea, constipation and blood in stool.  Genitourinary: Negative for dysuria, urgency, frequency and hematuria.  Musculoskeletal: Negative for myalgias, joint pain and falls.  Skin: Negative for rash.  Neurological: Negative for dizziness, tingling, tremors, sensory change, focal weakness, seizures, loss of consciousness, weakness and headaches.  Endo/Heme/Allergies: Negative for environmental allergies. Does not bruise/bleed easily.  Psychiatric/Behavioral: Negative for depression and memory loss. The patient is not nervous/anxious and does not have insomnia.      Objective:   Filed Vitals:   09/04/15 1449  BP: 130/88  Pulse: 83  Temp: 98.3 F (36.8 C)  TempSrc: Oral  Resp: 18  Height: 5\' 1"  (1.549 m)  Weight: 139 lb 6.4 oz (63.231 kg)  SpO2: 98%       Physical Exam  Constitutional: She is oriented to person, place, and time and well-developed, well-nourished, and in no distress.  HENT:  Head: Normocephalic and atraumatic.  Right Ear: External ear normal.  Left Ear: External ear normal.  Mouth/Throat: Oropharynx is clear and moist. No oropharyngeal exudate.  Eyes: Conjunctivae and EOM are normal. Pupils are equal, round, and reactive to light. No scleral icterus.  Neck: Normal range of motion. Neck supple. Carotid bruit is not present. No tracheal deviation present. No thyromegaly present.  Cardiovascular: Normal rate, regular rhythm and intact distal pulses.  Exam reveals no gallop and no friction rub.   Murmur (1/6 SEM) heard. Pulmonary/Chest: Effort normal and breath sounds  normal. She has no wheezes. She has no rhonchi. She has no rales. She exhibits no tenderness. Right breast exhibits no inverted nipple, no mass, no nipple discharge, no skin change and no tenderness. Left breast exhibits no inverted nipple, no mass, no nipple discharge, no skin change and no tenderness. Breasts are symmetrical.  Abdominal: Soft. Bowel sounds are normal. She exhibits no distension and no mass. There is no hepatosplenomegaly. There is no tenderness. There is no rebound and no guarding.  Genitourinary:  Deferred to GYN  Musculoskeletal: Normal range of motion.  Lymphadenopathy:    She has no cervical adenopathy.  Neurological: She is alert and oriented to person, place, and time. She has normal reflexes. Gait normal.  Skin: Skin is warm and dry. No rash noted.  Psychiatric: Mood, memory, affect and judgment normal.      Assessment:    Healthy female exam       ICD-9-CM ICD-10-CM   1. Well adult exam V70.0 Z00.00 CMP     Lipid Panel     CBC with Differential     TSH  2. Elevated blood pressure due to increased stressors 796.2 R03.0 Urinalysis with Reflex Microscopic    Plan:     See After Visit Summary for Counseling Recommendations    Pt is NOT UTD on health maintenance. Vaccinations are UTD. Pt maintains a healthy lifestyle. Encouraged pt to exercise 30-45 minutes 4-5 times per week. Eat a well balanced diet. Avoid smoking. Limit alcohol intake. Wear seatbelt when riding in the car. Wear sun block (SPF >50) when spending extended times outside.  cologuard for colon cancer screening  Continue current medications as ordered  Follow up with GYN for routine GYN care  Follow up in 1 yr for CPE. Return to office for fasting labs.  Flu shot given today  Judy Gutierrez S. Judy Gutierrez  [facility] [address] [place], [place] [address] [phone] Cell (Monday-Friday 8 AM - 5 PM) [phone] After 5 PM and follow  prompts

## 2015-09-09 ENCOUNTER — Other Ambulatory Visit: Payer: BLUE CROSS/BLUE SHIELD

## 2015-09-09 DIAGNOSIS — IMO0001 Reserved for inherently not codable concepts without codable children: Secondary | ICD-10-CM

## 2015-09-09 DIAGNOSIS — Z0000 Encounter for general adult medical examination without abnormal findings: Secondary | ICD-10-CM

## 2015-09-09 DIAGNOSIS — R030 Elevated blood-pressure reading, without diagnosis of hypertension: Secondary | ICD-10-CM

## 2015-09-10 LAB — COMPREHENSIVE METABOLIC PANEL
ALT: 31 IU/L (ref 0–32)
AST: 39 IU/L (ref 0–40)
Albumin/Globulin Ratio: 1.3 (ref 1.1–2.5)
Albumin: 4.2 g/dL (ref 3.5–5.5)
Alkaline Phosphatase: 132 IU/L — ABNORMAL HIGH (ref 39–117)
BUN/Creatinine Ratio: 29 — ABNORMAL HIGH (ref 9–23)
BUN: 16 mg/dL (ref 6–24)
Bilirubin Total: 0.4 mg/dL (ref 0.0–1.2)
CO2: 22 mmol/L (ref 18–29)
Calcium: 8.9 mg/dL (ref 8.7–10.2)
Chloride: 102 mmol/L (ref 97–106)
Creatinine, Ser: 0.56 mg/dL — ABNORMAL LOW (ref 0.57–1.00)
GFR calc Af Amer: 122 mL/min/{1.73_m2} (ref >59)
GFR calc non Af Amer: 106 mL/min/{1.73_m2} (ref >59)
Globulin, Total: 3.2 g/dL (ref 1.5–4.5)
Glucose: 90 mg/dL (ref 65–99)
Potassium: 3.9 mmol/L (ref 3.5–5.2)
Sodium: 141 mmol/L (ref 136–144)
Total Protein: 7.4 g/dL (ref 6.0–8.5)

## 2015-09-10 LAB — LIPID PANEL
Chol/HDL Ratio: 3.2 ratio units (ref 0.0–4.4)
Cholesterol, Total: 191 mg/dL (ref 100–199)
HDL: 60 mg/dL (ref >39)
LDL Calculated: 120 mg/dL — ABNORMAL HIGH (ref 0–99)
Triglycerides: 57 mg/dL (ref 0–149)
VLDL Cholesterol Cal: 11 mg/dL (ref 5–40)

## 2015-09-10 LAB — URINALYSIS, ROUTINE W REFLEX MICROSCOPIC
Bilirubin, UA: NEGATIVE
Glucose, UA: NEGATIVE
Ketones, UA: NEGATIVE
Leukocytes, UA: NEGATIVE
Nitrite, UA: NEGATIVE
Protein, UA: NEGATIVE
RBC, UA: NEGATIVE
Specific Gravity, UA: 1.015 (ref 1.005–1.030)
Urobilinogen, Ur: 0.2 mg/dL (ref 0.2–1.0)
pH, UA: 7 (ref 5.0–7.5)

## 2015-09-10 LAB — CBC WITH DIFFERENTIAL/PLATELET
Basophils Absolute: 0 10*3/uL (ref 0.0–0.2)
Basos: 0 %
EOS (ABSOLUTE): 0.2 10*3/uL (ref 0.0–0.4)
Eos: 5 %
Hematocrit: 37 % (ref 34.0–46.6)
Hemoglobin: 12.4 g/dL (ref 11.1–15.9)
Immature Grans (Abs): 0 10*3/uL (ref 0.0–0.1)
Immature Granulocytes: 0 %
Lymphocytes Absolute: 2 10*3/uL (ref 0.7–3.1)
Lymphs: 47 %
MCH: 26.8 pg (ref 26.6–33.0)
MCHC: 33.5 g/dL (ref 31.5–35.7)
MCV: 80 fL (ref 79–97)
Monocytes Absolute: 0.3 10*3/uL (ref 0.1–0.9)
Monocytes: 6 %
Neutrophils Absolute: 1.8 10*3/uL (ref 1.4–7.0)
Neutrophils: 42 %
Platelets: 289 10*3/uL (ref 150–379)
RBC: 4.63 x10E6/uL (ref 3.77–5.28)
RDW: 14.1 % (ref 12.3–15.4)
WBC: 4.2 10*3/uL (ref 3.4–10.8)

## 2015-09-10 LAB — TSH: TSH: 2.73 u[IU]/mL (ref 0.450–4.500)

## 2015-10-05 LAB — FECAL OCCULT BLOOD, GUAIAC: Fecal Occult Blood: NEGATIVE

## 2015-10-05 LAB — COLOGUARD: Cologuard: NEGATIVE

## 2015-10-16 ENCOUNTER — Encounter: Payer: Self-pay | Admitting: *Deleted

## 2015-11-26 ENCOUNTER — Other Ambulatory Visit: Payer: Self-pay

## 2015-11-26 DIAGNOSIS — Z1231 Encounter for screening mammogram for malignant neoplasm of breast: Secondary | ICD-10-CM

## 2015-12-24 ENCOUNTER — Ambulatory Visit
Admission: RE | Admit: 2015-12-24 | Discharge: 2015-12-24 | Disposition: A | Discharge status: Home / Self Care | Payer: No Typology Code available for payment source | Source: Ambulatory Visit

## 2015-12-24 DIAGNOSIS — Z1231 Encounter for screening mammogram for malignant neoplasm of breast: Secondary | ICD-10-CM

## 2016-05-03 ENCOUNTER — Telehealth: Payer: Self-pay | Admitting: *Deleted

## 2016-05-03 NOTE — Telephone Encounter (Signed)
Patient notified to contact the insurance provider.

## 2016-05-03 NOTE — Telephone Encounter (Signed)
Patient called and stated that she had a Cologuard done 10/05/2015 and was told it was covered by insurance. Her insurance is trying to charge her $600.00 and she wants to speak with the Dr. Please Advise.

## 2016-05-03 NOTE — Telephone Encounter (Signed)
She needs to take that up with her insurance provider.

## 2016-09-12 ENCOUNTER — Encounter: Payer: BLUE CROSS/BLUE SHIELD | Admitting: Nurse Practitioner

## 2016-10-13 ENCOUNTER — Ambulatory Visit (INDEPENDENT_AMBULATORY_CARE_PROVIDER_SITE_OTHER): Payer: No Typology Code available for payment source | Admitting: Nurse Practitioner

## 2016-10-13 ENCOUNTER — Encounter: Payer: Self-pay | Admitting: Nurse Practitioner

## 2016-10-13 VITALS — BP 126/82 | HR 83 | Temp 98.0°F | Resp 17 | Ht 61.0 in | Wt 134.4 lb

## 2016-10-13 DIAGNOSIS — Z0000 Encounter for general adult medical examination without abnormal findings: Secondary | ICD-10-CM

## 2016-10-13 DIAGNOSIS — Z23 Encounter for immunization: Secondary | ICD-10-CM | POA: Diagnosis not present

## 2016-10-13 DIAGNOSIS — Z1159 Encounter for screening for other viral diseases: Secondary | ICD-10-CM | POA: Diagnosis not present

## 2016-10-13 NOTE — Progress Notes (Signed)
Provider: Gildardo Cranker, DO  Patient Care Team: Gildardo Cranker, DO as PCP - General (Internal Medicine)  Extended Emergency Contact Information Primary Emergency Contact: Coughlin,Ajay Address: [address]          [place] Phone: [phone] Relation: Spouse No Known Allergies Goals of Care: Advanced Directive information Advanced Directives 10/13/2016  Does Patient Have a Medical Advance Directive? No  Would patient like information on creating a medical advance directive? -  Pre-existing out of facility DNR order (yellow form or pink MOST form) -     Chief Complaint  Patient presents with  . Medical Management of Chronic Issues    Complete physical    HPI: Patient is a 55 y.o. female seen in today for an annual wellness exam.   Major illnesses or hospitalization in the last year Had PAP by Dr Tera Helper in Sept 2017 Normally does not get flu vaccines, got one last year.    Depression screen [facility] 2/9 10/13/2016 09/04/2015 04/10/2013  Decreased Interest 0 0 0  Down, Depressed, Hopeless 0 0 0  PHQ - 2 Score 0 0 0    Fall Risk  10/13/2016 09/04/2015 04/10/2013  Falls in the past year? No No No   No flowsheet data found.   Health Maintenance  Topic Date Due  . Hepatitis C Screening  04/08/1961  . HIV Screening  08/26/1976  . TETANUS/TDAP  08/26/1980  . INFLUENZA VACCINE  05/17/2016  . MAMMOGRAM  12/23/2017  . Fecal DNA (Cologuard)  10/04/2018  . PAP SMEAR  06/18/2019    Diet? Vegan  Vision Screening Comments: Patient recently had eye exam while in [place] in December   Dentition: sees dentist every 6 months  Pain: none at this time.   Past Medical History:  Diagnosis Date  . Disturbance of skin sensation   . Myalgia and myositis, unspecified   . Other B-complex deficiencies   . 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 Unspecified essential hypertension     Past Surgical History:  Procedure Laterality  Date  . [place]    Social History   Social History  . Marital status: Married    Spouse name: N/A  . Number of children: N/A  . Years of education: N/A   Social History Main Topics  . Smoking status: Never Smoker  . Smokeless tobacco: Never Used  . Alcohol use No  . Drug use: No  . Sexual activity: Yes   Other Topics Concern  . None   Social History Narrative  . None    History reviewed. No pertinent family history.  Review of Systems:  Review of Systems  Constitutional: Negative for activity change, appetite change, fatigue and unexpected weight change.  HENT: Negative for congestion and hearing loss.   Eyes: Negative.   Respiratory: Negative for cough and shortness of breath.   Cardiovascular: Negative for chest pain, palpitations and leg swelling.  Gastrointestinal: Negative for abdominal pain, constipation and diarrhea.  Genitourinary: Negative for difficulty urinating and dysuria.  Musculoskeletal: Positive for myalgias. Negative for arthralgias.       To thighs after she has worked all day  Skin: Negative for color change and wound.  Neurological: Negative for dizziness and weakness.  Psychiatric/Behavioral: Negative for agitation, behavioral problems and confusion.     Allergies as of 10/13/2016   No Known Allergies     Medication List       Accurate as of 10/13/16  10:57 AM. Always use your most recent med list.          calcium carbonate 600 MG Tabs tablet Commonly known as:  OS-CAL Take 600 mg by mouth daily.   multivitamin with minerals tablet Take 1 tablet by mouth daily.         Physical Exam: Vitals:   10/13/16 1052  BP: 126/82  Pulse: 83  Resp: 17  Temp: 98 F (36.7 C)  TempSrc: Oral  SpO2: 98%  Weight: 134 lb 6.4 oz (61 kg)  Height: 5' 1"  (1.549 m)   Body mass index is 25.39 kg/m. Physical Exam  Constitutional: She is oriented to person, place, and time. She appears well-developed and well-nourished. No  distress.  HENT:  Head: Normocephalic and atraumatic.  Right Ear: External ear normal.  Left Ear: External ear normal.  Nose: Nose normal.  Mouth/Throat: Oropharynx is clear and moist. No oropharyngeal exudate.  Eyes: Conjunctivae are normal. Pupils are equal, round, and reactive to light.  Neck: Normal range of motion. Neck supple.  Cardiovascular: Normal rate, regular rhythm, normal heart sounds and intact distal pulses.   Pulmonary/Chest: Effort normal and breath sounds normal.  Abdominal: Soft. Bowel sounds are normal.  Genitourinary:  Genitourinary Comments: At GYN  Musculoskeletal: She exhibits no edema or tenderness.  Neurological: She is alert and oriented to person, place, and time.  Skin: Skin is warm and dry. She is not diaphoretic.  Psychiatric: She has a normal mood and affect.    Labs reviewed: Basic Metabolic Panel: No results for input(s): NA, K, CL, CO2, GLUCOSE, BUN, CREATININE, CALCIUM, MG, PHOS, TSH in the last 8760 hours. Liver Function Tests: No results for input(s): AST, ALT, ALKPHOS, BILITOT, PROT, ALBUMIN in the last 8760 hours. No results for input(s): LIPASE, AMYLASE in the last 8760 hours. No results for input(s): AMMONIA in the last 8760 hours. CBC: No results for input(s): WBC, NEUTROABS, HGB, HCT, MCV, PLT in the last 8760 hours. Lipid Panel: No results for input(s): CHOL, HDL, LDLCALC, TRIG, CHOLHDL, LDLDIRECT in the last 8760 hours. No results found for: HGBA1C  Procedures: No results found.  Assessment/Plan 1. Annual physical exam -pt doing well without concerns other than some myalgias in upper legs after she has been on her feet all day. Encouraged stretching.  the patient was counseled regarding the appropriate use of alcohol, regular self-examination of the breasts on a monthly basis, prevention of dental and periodontal disease, diet, regular sustained exercise for at least 30 minutes 5 times per week, routine screening interval for  mammogram as recommended by the [place] and ACOG, importance of regular PAP smears,  tobacco use,  and recommended schedule for GI hemoccult testing, colonoscopy, cholesterol, thyroid and diabetes screening. - CBC with Differential/Platelets; Future - CMP with eGFR; Future - Lipid panel; Future - Hepatitis C antibody; Future  2. Encounter for hepatitis C screening test for low risk patient - Hepatitis C antibody; Future  3. Need for immunization against influenza - Flu Vaccine QUAD 36+ mos PF IM (Fluarix & Fluzone Quad PF)

## 2016-10-13 NOTE — Patient Instructions (Signed)
Come back to office next week for fasting lab work   Make sure you are stretching after exercise Exercise at least 30 mins 5 days a week

## 2016-10-14 ENCOUNTER — Other Ambulatory Visit: Payer: No Typology Code available for payment source

## 2016-10-14 DIAGNOSIS — Z0000 Encounter for general adult medical examination without abnormal findings: Secondary | ICD-10-CM

## 2016-10-14 DIAGNOSIS — Z1159 Encounter for screening for other viral diseases: Secondary | ICD-10-CM

## 2016-10-14 LAB — CBC WITH DIFFERENTIAL/PLATELET
Basophils Absolute: 0 cells/uL (ref 0–200)
Basophils Relative: 0 %
Eosinophils Absolute: 141 cells/uL (ref 15–500)
Eosinophils Relative: 3 %
HCT: 36.7 % (ref 35.0–45.0)
Hemoglobin: 12.2 g/dL (ref 11.7–15.5)
Lymphocytes Relative: 39 %
Lymphs Abs: 1833 cells/uL (ref 850–3900)
MCH: 26.4 pg — ABNORMAL LOW (ref 27.0–33.0)
MCHC: 33.2 g/dL (ref 32.0–36.0)
MCV: 79.4 fL — ABNORMAL LOW (ref 80.0–100.0)
MPV: 9.4 fL (ref 7.5–12.5)
Monocytes Absolute: 376 cells/uL (ref 200–950)
Monocytes Relative: 8 %
Neutro Abs: 2350 cells/uL (ref 1500–7800)
Neutrophils Relative %: 50 %
Platelets: 286 10*3/uL (ref 140–400)
RBC: 4.62 MIL/uL (ref 3.80–5.10)
RDW: 14.9 % (ref 11.0–15.0)
WBC: 4.7 10*3/uL (ref 3.8–10.8)

## 2016-10-14 LAB — LIPID PANEL
Cholesterol: 187 mg/dL (ref <200)
HDL: 54 mg/dL (ref >50)
LDL Cholesterol: 120 mg/dL — ABNORMAL HIGH (ref <100)
Total CHOL/HDL Ratio: 3.5 Ratio (ref <5.0)
Triglycerides: 63 mg/dL (ref <150)
VLDL: 13 mg/dL (ref <30)

## 2016-10-14 LAB — COMPLETE METABOLIC PANEL WITH GFR
ALT: 16 U/L (ref 6–29)
AST: 22 U/L (ref 10–35)
Albumin: 3.9 g/dL (ref 3.6–5.1)
Alkaline Phosphatase: 113 U/L (ref 33–130)
BUN: 13 mg/dL (ref 7–25)
CO2: 24 mmol/L (ref 20–31)
Calcium: 8.8 mg/dL (ref 8.6–10.4)
Chloride: 106 mmol/L (ref 98–110)
Creat: 0.54 mg/dL (ref 0.50–1.05)
GFR, Est African American: >89 mL/min (ref >=60)
GFR, Est Non African American: >89 mL/min (ref >=60)
Glucose, Bld: 101 mg/dL — ABNORMAL HIGH (ref 65–99)
Potassium: 3.9 mmol/L (ref 3.5–5.3)
Sodium: 140 mmol/L (ref 135–146)
Total Bilirubin: 0.7 mg/dL (ref 0.2–1.2)
Total Protein: 7.3 g/dL (ref 6.1–8.1)

## 2016-10-15 LAB — HEPATITIS C ANTIBODY: HCV Ab: NEGATIVE

## 2016-11-16 ENCOUNTER — Other Ambulatory Visit: Payer: Self-pay | Admitting: Internal Medicine

## 2016-11-16 DIAGNOSIS — Z1231 Encounter for screening mammogram for malignant neoplasm of breast: Secondary | ICD-10-CM

## 2016-12-22 ENCOUNTER — Ambulatory Visit
Admission: RE | Admit: 2016-12-22 | Discharge: 2016-12-22 | Disposition: A | Discharge status: Home / Self Care | Payer: No Typology Code available for payment source | Source: Ambulatory Visit | Attending: Internal Medicine | Admitting: Internal Medicine

## 2016-12-22 DIAGNOSIS — Z1231 Encounter for screening mammogram for malignant neoplasm of breast: Secondary | ICD-10-CM

## 2017-11-14 ENCOUNTER — Other Ambulatory Visit: Payer: Self-pay | Admitting: Internal Medicine

## 2017-11-14 DIAGNOSIS — Z1231 Encounter for screening mammogram for malignant neoplasm of breast: Secondary | ICD-10-CM

## 2017-12-25 ENCOUNTER — Ambulatory Visit
Admission: RE | Admit: 2017-12-25 | Discharge: 2017-12-25 | Disposition: A | Discharge status: Home / Self Care | Payer: No Typology Code available for payment source | Source: Ambulatory Visit | Attending: Internal Medicine | Admitting: Internal Medicine

## 2017-12-25 DIAGNOSIS — Z1231 Encounter for screening mammogram for malignant neoplasm of breast: Secondary | ICD-10-CM

## 2018-06-06 ENCOUNTER — Encounter: Payer: Self-pay | Admitting: Internal Medicine

## 2018-06-19 ENCOUNTER — Telehealth: Payer: Self-pay

## 2018-06-19 ENCOUNTER — Telehealth: Payer: Self-pay | Admitting: Internal Medicine

## 2018-06-19 NOTE — Telephone Encounter (Signed)
ok 

## 2018-06-19 NOTE — Telephone Encounter (Signed)
Copied from CRM [id number]. Topic: Appointment Scheduling - New Patient >> Jun 19, 2018 11:50 AM Lorrine Kin, NT wrote: New patient has been scheduled for your office. Provider: Dr Zola Button Date of Appointment:  Patient states that her niece referred her to Dr Laury Axon. Would like to get established. Covered under [employer]   Route to department's PEC pool.

## 2018-06-19 NOTE — Telephone Encounter (Signed)
Can you please call to get patient scheduled and ask her to get any medical records and immunizations sent to [place] before appointment.

## 2018-06-19 NOTE — Telephone Encounter (Signed)
Called pt left message to call up back to set up new patient appt ok per Dr. Zola Button.

## 2018-06-19 NOTE — Telephone Encounter (Signed)
Called pt and left voicemail for patient to call us back to set up new patient appt   Copied from CRM [id number]. Topic: Appointment Scheduling - New Patient >> Jun 19, 2018 11:50 AM Lorrine Kin, NT wrote: New patient has been scheduled for your office. Provider: Dr Zola Button Date of Appointment:  Patient states that her niece referred her to Dr Laury Axon. Would like to get established. Covered under [employer]   Route to department's PEC pool.

## 2018-06-28 ENCOUNTER — Encounter: Payer: Self-pay | Admitting: Family Medicine

## 2018-06-28 ENCOUNTER — Ambulatory Visit (INDEPENDENT_AMBULATORY_CARE_PROVIDER_SITE_OTHER): Payer: No Typology Code available for payment source | Admitting: Family Medicine

## 2018-06-28 VITALS — BP 168/98 | HR 83 | Temp 97.8°F | Ht 61.0 in | Wt 139.2 lb

## 2018-06-28 DIAGNOSIS — Z23 Encounter for immunization: Secondary | ICD-10-CM | POA: Diagnosis not present

## 2018-06-28 DIAGNOSIS — Z0000 Encounter for general adult medical examination without abnormal findings: Secondary | ICD-10-CM

## 2018-06-28 NOTE — Progress Notes (Signed)
Subjective:     Judy Gutierrez is a 57 y.o. female and is here for a comprehensive physical exam. The patient reports no problems.  She is a new patient   Social History   Socioeconomic History  . Marital status: Married    Spouse name: Not on file  . Number of children: 1  . Years of education: Not on file  . Highest education level: Not on file  Occupational History  . Occupation: Science writer    Comment: self employed  Social Needs  . Financial resource strain: Not on file  . Food insecurity:    Worry: Not on file    Inability: Not on file  . Transportation needs:    Medical: Not on file    Non-medical: Not on file  Tobacco Use  . Smoking status: Never Smoker  . Smokeless tobacco: Never Used  Substance and Sexual Activity  . Alcohol use: No  . Drug use: No  . Sexual activity: Yes  Lifestyle  . Physical activity:    Days per week: Not on file    Minutes per session: Not on file  . Stress: Not on file  Relationships  . Social connections:    Talks on phone: Not on file    Gets together: Not on file    Attends religious service: Not on file    Active member of club or organization: Not on file    Attends meetings of clubs or organizations: Not on file    Relationship status: Not on file  . Intimate partner violence:    Fear of current or ex partner: Not on file    Emotionally abused: Not on file    Physically abused: Not on file    Forced sexual activity: Not on file  Other Topics Concern  . Not on file  Social History Narrative  . Not on file   Health Maintenance  Topic Date Due  . HIV Screening  08/26/1976  . TETANUS/TDAP  08/26/1980  . INFLUENZA VACCINE  05/17/2018  . Fecal DNA (Cologuard)  10/04/2018  . PAP SMEAR  06/18/2019  . MAMMOGRAM  12/26/2019  . 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 She does not have any pertinent problems on file. She  has a past surgical history that includes Cesarean section (1992). Her family history includes Diabetes Mellitus II in her sister; Hepatitis in her father; Pulmonary embolism in her mother. She  reports that she has never smoked. She has never used smokeless tobacco. She reports that she does not drink alcohol or use drugs. She has a current medication list which includes the following prescription(s): calcium carbonate and multivitamin with minerals. Current Outpatient Medications on File Prior to Visit  Medication Sig Dispense Refill  . calcium carbonate (OS-CAL) 600 MG TABS Take 600 mg by mouth daily.    . Multiple Vitamins-Minerals (MULTIVITAMIN WITH MINERALS) tablet Take 1 tablet by mouth daily.     No current facility-administered medications on file prior to visit.    She has No Known Allergies..  Review of Systems Review of Systems  Constitutional: Negative for activity change, appetite change and fatigue.  HENT: Negative for hearing loss, congestion, tinnitus and ear discharge.  dentist q50m Eyes:  Negative for visual disturbance (see optho q1y -- vision corrected to 20/20 with glasses).  Respiratory: Negative for cough, chest tightness and shortness of breath.   Cardiovascular: Negative for chest pain, palpitations and leg swelling.  Gastrointestinal: Negative for abdominal pain, diarrhea, constipation and abdominal distention.  Genitourinary: Negative for urgency, frequency, decreased urine volume and difficulty urinating.  Musculoskeletal: Negative for back pain, arthralgias and gait problem.  Skin: Negative for color change, pallor and rash.  Neurological: Negative for dizziness, light-headedness, numbness and headaches.  Hematological: Negative for adenopathy. Does not  bruise/bleed easily.  Psychiatric/Behavioral: Negative for suicidal ideas, confusion, sleep disturbance, self-injury, dysphoric mood, decreased concentration and agitation.       Objective:    BP (!) 168/98 (BP Location: Right Arm, Patient Position: Sitting, Cuff Size: Normal)   Pulse 83   Temp 97.8 F (36.6 C) (Oral)   Ht 5\' 1"  (1.549 m)   Wt 139 lb 3.2 oz (63.1 kg)   LMP 12/08/2010   SpO2 99%   BMI 26.30 kg/m  General appearance: alert, cooperative, appears stated age and no distress Head: Normocephalic, without obvious abnormality, atraumatic Eyes: conjunctivae/corneas clear. PERRL, EOM's intact. Fundi benign. Ears: normal TM's and external ear canals both ears Nose: Nares normal. Septum midline. Mucosa normal. No drainage or sinus tenderness. Throat: lips, mucosa, and tongue normal; teeth and gums normal Neck: no adenopathy, no carotid bruit, no JVD, supple, symmetrical, trachea midline and thyroid not enlarged, symmetric, no tenderness/mass/nodules Back: symmetric, no curvature. ROM normal. No CVA tenderness. Lungs: clear to auscultation bilaterally Heart: regular rate and rhythm, S1, S2 normal, no murmur, click, rub or gallop Abdomen: soft, non-tender; bowel sounds normal; no masses,  no organomegaly Extremities: extremities normal, atraumatic, no cyanosis or edema Pulses: 2+ and symmetric Skin: Skin color, texture, turgor normal. No rashes or lesions Lymph nodes: Cervical, supraclavicular, and axillary nodes normal. Neurologic: Alert and oriented X 3, normal strength and tone. Normal symmetric reflexes. Normal coordination and gait    Assessment:    Healthy female exam.      Plan:    ghm utd Check labs  See After Visit Summary for Counseling Recommendations   Pt will return for shingrix 1. Preventative health care See above - CBC with Differential/Platelet - Lipid panel - TSH - Comprehensive metabolic panel  2. Need for influenza vaccination   - Flu  Vaccine QUAD 36+ mos IM  3. Need for Td vaccine   - Td vaccine greater than or equal to 7yo preservative free IM

## 2018-06-28 NOTE — Patient Instructions (Signed)
Preventive Care 40-64 Years, Female Preventive care refers to lifestyle choices and visits with your health care provider that can promote health and wellness. What does preventive care include?  A yearly physical exam. This is also called an annual well check.  Dental exams once or twice a year.  Routine eye exams. Ask your health care provider how often you should have your eyes checked.  Personal lifestyle choices, including: ? Daily care of your teeth and gums. ? Regular physical activity. ? Eating a healthy diet. ? Avoiding tobacco and drug use. ? Limiting alcohol use. ? Practicing safe sex. ? Taking low-dose aspirin daily starting at age 58. ? Taking vitamin and mineral supplements as recommended by your health care provider. What happens during an annual well check? The services and screenings done by your health care provider during your annual well check will depend on your age, overall health, lifestyle risk factors, and family history of disease. Counseling Your health care provider may ask you questions about your:  Alcohol use.  Tobacco use.  Drug use.  Emotional well-being.  Home and relationship well-being.  Sexual activity.  Eating habits.  Work and work Statistician.  Method of birth control.  Menstrual cycle.  Pregnancy history.  Screening You may have the following tests or measurements:  Height, weight, and BMI.  Blood pressure.  Lipid and cholesterol levels. These may be checked every 5 years, or more frequently if you are over 81 years old.  Skin check.  Lung cancer screening. You may have this screening every year starting at age 78 if you have a 30-pack-year history of smoking and currently smoke or have quit within the past 15 years.  Fecal occult blood test (FOBT) of the stool. You may have this test every year starting at age 65.  Flexible sigmoidoscopy or colonoscopy. You may have a sigmoidoscopy every 5 years or a colonoscopy  every 10 years starting at age 30.  Hepatitis C blood test.  Hepatitis B blood test.  Sexually transmitted disease (STD) testing.  Diabetes screening. This is done by checking your blood sugar (glucose) after you have not eaten for a while (fasting). You may have this done every 1-3 years.  Mammogram. This may be done every 1-2 years. Talk to your health care provider about when you should start having regular mammograms. This may depend on whether you have a family history of breast cancer.  BRCA-related cancer screening. This may be done if you have a family history of breast, ovarian, tubal, or peritoneal cancers.  Pelvic exam and Pap test. This may be done every 3 years starting at age 80. Starting at age 36, this may be done every 5 years if you have a Pap test in combination with an HPV test.  Bone density scan. This is done to screen for osteoporosis. You may have this scan if you are at high risk for osteoporosis.  Discuss your test results, treatment options, and if necessary, the need for more tests with your health care provider. Vaccines Your health care provider may recommend certain vaccines, such as:  Influenza vaccine. This is recommended every year.  Tetanus, diphtheria, and acellular pertussis (Tdap, Td) vaccine. You may need a Td booster every 10 years.  Varicella vaccine. You may need this if you have not been vaccinated.  Zoster vaccine. You may need this after age 5.  Measles, mumps, and rubella (MMR) vaccine. You may need at least one dose of MMR if you were born in  1957 or later. You may also need a second dose.  Pneumococcal 13-valent conjugate (PCV13) vaccine. You may need this if you have certain conditions and were not previously vaccinated.  Pneumococcal polysaccharide (PPSV23) vaccine. You may need one or two doses if you smoke cigarettes or if you have certain conditions.  Meningococcal vaccine. You may need this if you have certain  conditions.  Hepatitis A vaccine. You may need this if you have certain conditions or if you travel or work in places where you may be exposed to hepatitis A.  Hepatitis B vaccine. You may need this if you have certain conditions or if you travel or work in places where you may be exposed to hepatitis B.  Haemophilus influenzae type b (Hib) vaccine. You may need this if you have certain conditions.  Talk to your health care provider about which screenings and vaccines you need and how often you need them. This information is not intended to replace advice given to you by your health care provider. Make sure you discuss any questions you have with your health care provider. Document Released: 10/30/2015 Document Revised: 06/22/2016 Document Reviewed: 08/04/2015 Elsevier Interactive Patient Education  2018 [employer]  

## 2018-06-29 LAB — CBC WITH DIFFERENTIAL/PLATELET
Basophils Absolute: 0.1 10*3/uL (ref 0.0–0.1)
Basophils Relative: 1.1 % (ref 0.0–3.0)
Eosinophils Absolute: 0.3 10*3/uL (ref 0.0–0.7)
Eosinophils Relative: 5.1 % — ABNORMAL HIGH (ref 0.0–5.0)
HCT: 35.2 % — ABNORMAL LOW (ref 36.0–46.0)
Hemoglobin: 11.4 g/dL — ABNORMAL LOW (ref 12.0–15.0)
Lymphocytes Relative: 40.6 % (ref 12.0–46.0)
Lymphs Abs: 2.7 10*3/uL (ref 0.7–4.0)
MCHC: 32.5 g/dL (ref 30.0–36.0)
MCV: 77.5 fl — ABNORMAL LOW (ref 78.0–100.0)
Monocytes Absolute: 0.4 10*3/uL (ref 0.1–1.0)
Monocytes Relative: 6.2 % (ref 3.0–12.0)
Neutro Abs: 3.1 10*3/uL (ref 1.4–7.7)
Neutrophils Relative %: 47 % (ref 43.0–77.0)
Platelets: 300 10*3/uL (ref 150.0–400.0)
RBC: 4.55 Mil/uL (ref 3.87–5.11)
RDW: 15.8 % — ABNORMAL HIGH (ref 11.5–15.5)
WBC: 6.6 10*3/uL (ref 4.0–10.5)

## 2018-06-29 LAB — COMPREHENSIVE METABOLIC PANEL
ALT: 26 U/L (ref 0–35)
AST: 32 U/L (ref 0–37)
Albumin: 4.2 g/dL (ref 3.5–5.2)
Alkaline Phosphatase: 101 U/L (ref 39–117)
BUN: 16 mg/dL (ref 6–23)
CO2: 28 mEq/L (ref 19–32)
Calcium: 9.3 mg/dL (ref 8.4–10.5)
Chloride: 104 mEq/L (ref 96–112)
Creatinine, Ser: 0.6 mg/dL (ref 0.40–1.20)
GFR: 109.58 mL/min (ref >60.00)
Glucose, Bld: 90 mg/dL (ref 70–99)
Potassium: 4 mEq/L (ref 3.5–5.1)
Sodium: 140 mEq/L (ref 135–145)
Total Bilirubin: 0.5 mg/dL (ref 0.2–1.2)
Total Protein: 7.8 g/dL (ref 6.0–8.3)

## 2018-06-29 LAB — LIPID PANEL
Cholesterol: 181 mg/dL (ref 0–200)
HDL: 49.4 mg/dL (ref >39.00)
LDL Cholesterol: 117 mg/dL — ABNORMAL HIGH (ref 0–99)
NonHDL: 131.77
Total CHOL/HDL Ratio: 4
Triglycerides: 76 mg/dL (ref 0.0–149.0)
VLDL: 15.2 mg/dL (ref 0.0–40.0)

## 2018-06-29 LAB — TSH: TSH: 3.2 u[IU]/mL (ref 0.35–4.50)

## 2018-07-04 DIAGNOSIS — R739 Hyperglycemia, unspecified: Secondary | ICD-10-CM

## 2018-07-04 DIAGNOSIS — D649 Anemia, unspecified: Secondary | ICD-10-CM

## 2018-07-04 DIAGNOSIS — E785 Hyperlipidemia, unspecified: Secondary | ICD-10-CM

## 2018-07-04 DIAGNOSIS — K625 Hemorrhage of anus and rectum: Secondary | ICD-10-CM

## 2018-07-05 ENCOUNTER — Telehealth: Payer: Self-pay | Admitting: *Deleted

## 2018-07-05 NOTE — Telephone Encounter (Signed)
-----   Message from Donato SchultzYvonne R Lowne Chase, [place] sent at 06/28/2018  9:07 PM EDT ----- I thought she took another bp that was lower but don't see it She has a hx of htn but on no bp meds ----  But I need her to come back sooner if it was not lower  Like in 4- 6 weeks  Send her dash diet

## 2018-07-05 NOTE — Telephone Encounter (Signed)
Left detailed message on patient machine for her to make an appointment in 4-6 weeks to recheck blood pressure.  Dash diet sent in mail.

## 2018-07-17 ENCOUNTER — Ambulatory Visit: Payer: No Typology Code available for payment source

## 2018-07-27 ENCOUNTER — Ambulatory Visit (INDEPENDENT_AMBULATORY_CARE_PROVIDER_SITE_OTHER): Payer: No Typology Code available for payment source

## 2018-07-27 DIAGNOSIS — Z23 Encounter for immunization: Secondary | ICD-10-CM

## 2018-07-27 NOTE — Progress Notes (Signed)
Pre visit review using our clinic tool,if applicable. No additional management support is needed unless otherwise documented below in the visit note.   Patient in for first Shingrix immunization and tolerate well. Given in Right arm. Advised if she has Medicare she would have to get 2nd dose at Pharmacy. Patient states she does not. WIll call back and schedule appointment for 2nd dose.

## 2018-07-31 NOTE — Progress Notes (Signed)
Pt. Called 10/12 reporting fever/chills. [facility] call center RN, Rubie Maid, reviewed common SE of shingles vaccine and gave advice on when to see care per guidelines, as notated in fax received from [facility]. Pt. Had reportedly verbalized understanding.

## 2018-11-16 ENCOUNTER — Other Ambulatory Visit: Payer: Self-pay | Admitting: Family Medicine

## 2018-11-16 DIAGNOSIS — Z1231 Encounter for screening mammogram for malignant neoplasm of breast: Secondary | ICD-10-CM

## 2019-01-02 ENCOUNTER — Ambulatory Visit: Payer: No Typology Code available for payment source

## 2019-03-21 ENCOUNTER — Ambulatory Visit: Payer: Self-pay | Admitting: *Deleted

## 2019-03-21 NOTE — Telephone Encounter (Signed)
Pt reports external hemorrhoid x 5 days. No bleeding,no pain, "Itching, uncomfortable." Home care advise given per protocol, verbalizes understanding.  Reason for Disposition . Rectal bleeding is minimal (e.g., blood just on toilet paper, a few drops in toilet bowl)  Answer Assessment - Initial Assessment Questions 1. APPEARANCE of BLOOD: "What color is it?" "Is it passed separately, on the surface of the stool, or mixed in with the stool?"      *No Answer* 2. AMOUNT: "How much blood was passed?"      *No Answer* 3. FREQUENCY: "How many times has blood been passed with the stools?"      *No Answer* 4. ONSET: "When was the blood first seen in the stools?" (Days or weeks)      *No Answer* 5. DIARRHEA: "Is there also some diarrhea?" If so, ask: "How many diarrhea stools were passed in past 24 hours?"      *No Answer* 6. CONSTIPATION: "Do you have constipation?" If so, "How bad is it?"     *No Answer* 7. RECURRENT SYMPTOMS: "Have you had blood in your stools before?" If so, ask: "When was the last time?" and "What happened that time?"      *No Answer* 8. BLOOD THINNERS: "Do you take any blood thinners?" (e.g., Coumadin/warfarin, Pradaxa/dabigatran, aspirin)     *No Answer* 9. OTHER SYMPTOMS: "Do you have any other symptoms?"  (e.g., abdominal pain, vomiting, dizziness, fever)    No bleeding, itching hemorrhoids.  Protocols used: RECTAL BLEEDING-A-AH

## 2019-05-20 ENCOUNTER — Ambulatory Visit
Admission: RE | Admit: 2019-05-20 | Discharge: 2019-05-20 | Disposition: A | Discharge status: Home / Self Care | Payer: No Typology Code available for payment source | Source: Ambulatory Visit | Attending: Family Medicine | Admitting: Family Medicine

## 2019-05-20 ENCOUNTER — Other Ambulatory Visit: Payer: Self-pay

## 2019-05-20 DIAGNOSIS — Z1231 Encounter for screening mammogram for malignant neoplasm of breast: Secondary | ICD-10-CM

## 2019-07-01 ENCOUNTER — Encounter: Payer: Self-pay | Admitting: Family Medicine

## 2020-01-29 ENCOUNTER — Encounter (HOSPITAL_BASED_OUTPATIENT_CLINIC_OR_DEPARTMENT_OTHER): Payer: Self-pay | Admitting: *Deleted

## 2020-01-29 ENCOUNTER — Inpatient Hospital Stay (HOSPITAL_BASED_OUTPATIENT_CLINIC_OR_DEPARTMENT_OTHER)
Admission: EM | Admit: 2020-01-29 | Discharge: 2020-02-01 | DRG: 065 | Disposition: A | Discharge status: Home / Self Care | Payer: No Typology Code available for payment source | Source: Ambulatory Visit | Attending: Neurology | Admitting: Neurology

## 2020-01-29 ENCOUNTER — Other Ambulatory Visit: Payer: Self-pay

## 2020-01-29 ENCOUNTER — Emergency Department (HOSPITAL_BASED_OUTPATIENT_CLINIC_OR_DEPARTMENT_OTHER): Payer: No Typology Code available for payment source

## 2020-01-29 DIAGNOSIS — Z9282 Status post administration of tPA (rtPA) in a different facility within the last 24 hours prior to admission to current facility: Secondary | ICD-10-CM

## 2020-01-29 DIAGNOSIS — I6340 Cerebral infarction due to embolism of unspecified cerebral artery: Secondary | ICD-10-CM | POA: Diagnosis not present

## 2020-01-29 DIAGNOSIS — R42 Dizziness and giddiness: Secondary | ICD-10-CM | POA: Diagnosis not present

## 2020-01-29 DIAGNOSIS — I639 Cerebral infarction, unspecified: Secondary | ICD-10-CM | POA: Diagnosis present

## 2020-01-29 DIAGNOSIS — Z79899 Other long term (current) drug therapy: Secondary | ICD-10-CM

## 2020-01-29 DIAGNOSIS — I6350 Cerebral infarction due to unspecified occlusion or stenosis of unspecified cerebral artery: Secondary | ICD-10-CM | POA: Diagnosis present

## 2020-01-29 DIAGNOSIS — R270 Ataxia, unspecified: Secondary | ICD-10-CM | POA: Diagnosis present

## 2020-01-29 DIAGNOSIS — R29700 NIHSS score 0: Secondary | ICD-10-CM | POA: Diagnosis present

## 2020-01-29 DIAGNOSIS — I10 Essential (primary) hypertension: Secondary | ICD-10-CM | POA: Diagnosis present

## 2020-01-29 DIAGNOSIS — E785 Hyperlipidemia, unspecified: Secondary | ICD-10-CM | POA: Diagnosis present

## 2020-01-29 DIAGNOSIS — Z20822 Contact with and (suspected) exposure to covid-19: Secondary | ICD-10-CM | POA: Diagnosis present

## 2020-01-29 DIAGNOSIS — E876 Hypokalemia: Secondary | ICD-10-CM | POA: Diagnosis present

## 2020-01-29 LAB — COMPREHENSIVE METABOLIC PANEL
ALT: 29 U/L (ref 0–44)
AST: 42 U/L — ABNORMAL HIGH (ref 15–41)
Albumin: 4 g/dL (ref 3.5–5.0)
Alkaline Phosphatase: 112 U/L (ref 38–126)
Anion gap: 11 (ref 5–15)
BUN: 16 mg/dL (ref 6–20)
CO2: 24 mmol/L (ref 22–32)
Calcium: 8.6 mg/dL — ABNORMAL LOW (ref 8.9–10.3)
Chloride: 103 mmol/L (ref 98–111)
Creatinine, Ser: 0.48 mg/dL (ref 0.44–1.00)
GFR calc Af Amer: >60 mL/min (ref >60)
GFR calc non Af Amer: >60 mL/min (ref >60)
Glucose, Bld: 153 mg/dL — ABNORMAL HIGH (ref 70–99)
Potassium: 3.1 mmol/L — ABNORMAL LOW (ref 3.5–5.1)
Sodium: 138 mmol/L (ref 135–145)
Total Bilirubin: 0.6 mg/dL (ref 0.3–1.2)
Total Protein: 8.1 g/dL (ref 6.5–8.1)

## 2020-01-29 LAB — APTT: aPTT: 28 seconds (ref 24–36)

## 2020-01-29 LAB — CBC WITH DIFFERENTIAL/PLATELET
Abs Immature Granulocytes: 0.07 10*3/uL (ref 0.00–0.07)
Basophils Absolute: 0 10*3/uL (ref 0.0–0.1)
Basophils Relative: 0 %
Eosinophils Absolute: 0.2 10*3/uL (ref 0.0–0.5)
Eosinophils Relative: 2 %
HCT: 37.8 % (ref 36.0–46.0)
Hemoglobin: 11.9 g/dL — ABNORMAL LOW (ref 12.0–15.0)
Immature Granulocytes: 1 %
Lymphocytes Relative: 30 %
Lymphs Abs: 2.7 10*3/uL (ref 0.7–4.0)
MCH: 26.4 pg (ref 26.0–34.0)
MCHC: 31.5 g/dL (ref 30.0–36.0)
MCV: 84 fL (ref 80.0–100.0)
Monocytes Absolute: 0.4 10*3/uL (ref 0.1–1.0)
Monocytes Relative: 4 %
Neutro Abs: 5.7 10*3/uL (ref 1.7–7.7)
Neutrophils Relative %: 63 %
Platelets: 277 10*3/uL (ref 150–400)
RBC: 4.5 MIL/uL (ref 3.87–5.11)
RDW: 13.2 % (ref 11.5–15.5)
WBC: 9.1 10*3/uL (ref 4.0–10.5)
nRBC: 0 % (ref 0.0–0.2)

## 2020-01-29 LAB — ETHANOL: Alcohol, Ethyl (B): <10 mg/dL (ref <10)

## 2020-01-29 LAB — PROTIME-INR
INR: 1 (ref 0.8–1.2)
Prothrombin Time: 13.2 seconds (ref 11.4–15.2)

## 2020-01-29 MED ORDER — DIPHENHYDRAMINE HCL 50 MG/ML IJ SOLN
25.0000 mg | Freq: Once | INTRAMUSCULAR | Status: DC
  Filled 2020-01-29: qty 1

## 2020-01-29 MED ORDER — PROCHLORPERAZINE EDISYLATE 10 MG/2ML IJ SOLN
10.0000 mg | Freq: Once | INTRAMUSCULAR | Status: DC
  Filled 2020-01-29: qty 2

## 2020-01-29 MED ORDER — LABETALOL HCL 5 MG/ML IV SOLN: 10.0000 mg | Freq: Once | INTRAVENOUS | Status: DC

## 2020-01-29 MED ORDER — ALTEPLASE (STROKE) FULL DOSE INFUSION
0.9000 mg/kg | Freq: Once | INTRAVENOUS | Status: DC
  Filled 2020-01-29 (×2): qty 100

## 2020-01-29 MED ORDER — TENECTEPLASE 50 MG IV KIT
PACK | INTRAVENOUS | Status: AC
  Filled 2020-01-29: qty 10

## 2020-01-29 MED ORDER — SODIUM CHLORIDE 0.9 % IV SOLN
50.0000 mL | Freq: Once | INTRAVENOUS | Status: AC
  Administered 2020-01-30: 50 mL via INTRAVENOUS

## 2020-01-29 MED ORDER — IOHEXOL 350 MG/ML SOLN
100.0000 mL | Freq: Once | INTRAVENOUS | Status: AC | PRN
  Administered 2020-01-29: 100 mL via INTRAVENOUS

## 2020-01-29 MED ORDER — LABETALOL HCL 5 MG/ML IV SOLN
INTRAVENOUS | Status: AC
  Administered 2020-01-29: 10 mg
  Filled 2020-01-29: qty 4

## 2020-01-29 MED ORDER — SODIUM CHLORIDE 0.9 % IV BOLUS
1000.0000 mL | Freq: Once | INTRAVENOUS | Status: AC
  Administered 2020-01-29: 23:00:00 1000 mL via INTRAVENOUS

## 2020-01-29 MED ORDER — SODIUM CHLORIDE 0.9 % IV SOLN: 50.0000 mL | Freq: Once | INTRAVENOUS | Status: DC

## 2020-01-29 MED ORDER — ALTEPLASE (STROKE) FULL DOSE INFUSION
0.9000 mg/kg | Freq: Once | INTRAVENOUS | Status: AC
  Administered 2020-01-29: 57.2 mg via INTRAVENOUS
  Filled 2020-01-29: qty 100

## 2020-01-29 NOTE — ED Triage Notes (Signed)
Pt co vomiting x 3 hrs

## 2020-01-29 NOTE — ED Notes (Signed)
Called (Tele Neuro)  Spoke to [place]

## 2020-01-29 NOTE — ED Notes (Signed)
Patient to CT.

## 2020-01-29 NOTE — Consult Note (Addendum)
[facility] Tele[facility]rology Consult Services   Date of Service:   01/29/2020 22:29:31  Impression:     .  I63.9 - Cerebrovascular accident (CVA), unspecified mechanism (HCC)  Comments/Sign-Out: 58yo woman w/no significant PMH p/w dizziness, imbalance, diplopia, vomiting on 01/29/20. She was eating pasta when she suddenly developed room-spinning dizziness and then vomited 3 times. She had unsteady gait on the way to the bathroom. She also briefly saw 3-4 of the same object but that has resolved. She denies weakness, numbness, speech changes, headache, chest pain, hearing loss, tinnitus. She currently feels nausea and lightheadedness, and continues to dry heave. NIHSS 0, neuro exam shows unsteady gait/gait ataxia. CTH has no acute findings. The patient's unsteady gait is new, acute, mild but disabling. Considering the possible disabling effect of this deficit on daily living, the benefits of alteplase outweigh the risks in this setting of a low NIHSS in accordance with AHA stroke guidelines. NIHSS is also known to underestimate posterior circulation strokes which this presentation is consistent with. After no contraindications were identified, I discussed the indication, side effects and risk/benefit of alteplase compared to antiplatelet therapy with the patient and her family who accepted treatment. CTA Head/Neck will be followed up to rule out large vessel occlusion, specifically in the basilar. Presentation is concerning for acute brainstem ischemic stroke based on history and exam, stroke workup with post alteplase protocol advised.  Metrics: Last Known Well: 01/29/2020 19:15:00 [facility] Notification Time: 01/29/2020 22:28:50 Arrival Time: 01/29/2020 21:50:00 Stamp Time: 01/29/2020 22:29:31 Time First Login Attempt: 01/29/2020 22:35:53 Symptoms: dizziness, imbalance, diplopia, vomiting NIHSS Start Assessment Time: 01/29/2020 22:36:25 Alteplase Early Mix Decision Time:  01/29/2020 23:06:36 Patient is a candidate for Alteplase/Activase. Alteplase Medical Decision: 01/29/2020 23:06:37 Reason for delay in Medical Decision Making for thrombolytics: More extensive discussion on risks and benefits was requested by patient or family. Alteplase/Activase CPOE Order Time: 01/29/2020 23:10:41 Needle Time: 01/29/2020 23:34:18 Weight Noted by Staff: 140 lbs Reason for Alteplase/Activase Delay: Delayed hospital activation of TS Service  CT head showed no acute hemorrhage or acute core infarct.  Lower Likelihood of Large Vessel Occlusion but Following Stat Studies are Recommended  CTA Head and Neck. CT Perfusion.   ED Physician notified of diagnostic impression and management plan on 01/29/2020 23:39:34  Alteplase/Activase Contraindications:  Last Known Well > 4.5 hours: No CT Head showing hemorrhage: No Ischemic stroke within 3 months: No Severe head trauma within 3 months: No Intracranial/intraspinal surgery within 3 months: No History of intracranial hemorrhage: No Symptoms and signs consistent with an SAH: No GI malignancy or GI bleed within 21 days: No Coagulopathy: Platelets <100 000/mm3, INR >1.7, aPTT>40 s, or PT >15 s: No Treatment dose of LMWH within the previous 24 hrs: No Use of NOACs in past 48 hours: No Glycoprotein IIb/IIIa receptor inhibitors use: No Symptoms consistent with infective endocarditis: No Suspected aortic arch dissection: No Intra-axial intracranial neoplasm: No  Verbal Consent to Alteplase/Activase: I have explained to the Patient and Family the nature of the patient's condition, reviewed the indications and contraindications to the use of Alteplase/Activase fibrinolytic agent, reviewed the indications and contraindications and the benefits to be reasonably expected compared with alternative approaches. I have discussed the likelihood of major risks or complications of this procedure including (if applicable) but not limited to  loss of limb function, brain damage, paralysis, hemorrhage, infection, complications from transfusion of blood components, drug reactions, blood clots and loss of life. I have also indicated that with any procedure there is always the possibility  of an unexpected complication. All questions were answered and Patient and Family express understanding of the treatment plan and consent to the treatment.  Our recommendations are outlined below.  Recommendations: IV Alteplase/Activase recommended.  Alteplase/Activase bolus given Without Complication.   IV Alteplase/Activase Total Dose - 57.1 mg IV Alteplase/Activase Bolus Dose - 5.7 mg IV Alteplase/Activase Infusion Dose - 51.4 mg  Routine post Alteplase/Activase monitoring including neuro checks and blood pressure control during/after treatment Monitor blood pressure Check blood pressure and neuro assessment every 15 min for 2 h, then every 30 min for 6 h, and finally every hour for 16 h.  Manage Blood Pressure per post Alteplase/Activase protocol.      .  Admission to ICU     .  CT brain 24 hours post Alteplase/Activase     .  NPO until swallowing screen performed and passed     .  No antiplatelet agents or anticoagulants (including heparin for DVT prophylaxis) in first 24 hours     .  No Foley catheter, nasogastric tube, arterial catheter or central venous catheter for 24 hr, unless absolutely necessary     .  Telemetry     .  Bedside swallow evaluation     .  HOB less than 30 degrees     .  Euglycemia     .  Avoid hyperthermia, PRN acetaminophen     .  DVT prophylaxis     .  Inpatient Neurology Consultation     .  Stroke evaluation as per inpatient neurology recommendations  Discussed with ED physician    ------------------------------------------------------------------------------  History of Present Illness: Patient is a 59 year old Female.  Patient was brought by private transportation with symptoms of dizziness,  imbalance, diplopia, vomiting  58yo woman w/no significant PMH p/w dizziness, imbalance, diplopia, vomiting on 01/29/20. She was eating pasta when she suddenly developed room-spinning dizziness and then vomited 3 times. She had unsteady gait on the way to the bathroom. She also briefly saw 3-4 of the same object but that has resolved. She denies weakness, numbness, speech changes, headache, chest pain, hearing loss, tinnitus. She currently feels nausea and lightheadedness, and continues to dry heave.  Last seen normal was within 4.5 hours. There is no history of hemorrhagic complications or intracranial hemorrhage. There is no history of Recent Anticoagulants. There is no history of recent major surgery. There is no history of recent stroke.  Past Medical History:     . There is NO history of Hypertension     . There is NO history of Diabetes Mellitus     . There is NO history of Hyperlipidemia     . There is NO history of Atrial Fibrillation     . There is NO history of Coronary Artery Disease     . There is NO history of Stroke  Anticoagulant use:  No  Antiplatelet use: No   Examination: BP(183/109), Pulse(81), Blood Glucose(153) 1A: Level of Consciousness - Alert; keenly responsive + 0 1B: Ask Month and Age - Both Questions Right + 0 1C: Blink Eyes & Squeeze Hands - Performs Both Tasks + 0 2: Test Horizontal Extraocular Movements - Normal + 0 3: Test Visual Fields - No Visual Loss + 0 4: Test Facial Palsy (Use Grimace if Obtunded) - Normal symmetry + 0 5A: Test Left Arm Motor Drift - No Drift for 10 Seconds + 0 5B: Test Right Arm Motor Drift - No Drift for 10 Seconds + 0 6A:  Test Left Leg Motor Drift - No Drift for 5 Seconds + 0 6B: Test Right Leg Motor Drift - No Drift for 5 Seconds + 0 7: Test Limb Ataxia (FNF/Heel-Shin) - No Ataxia + 0 8: Test Sensation - Normal; No sensory loss + 0 9: Test Language/Aphasia - Normal; No aphasia + 0 10: Test Dysarthria - Normal + 0 11: Test  Extinction/Inattention - No abnormality + 0  NIHSS Score: 0  Pre-Morbid Modified Ranking Scale: 0 Points = No symptoms at all   Patient/Family was informed the Neurology Consult would occur via Tele[facility] consult by way of interactive audio and video telecommunications and consented to receiving care in this manner.   Due to the immediate potential for life-threatening deterioration due to underlying acute neurologic illness, I spent 60 minutes providing critical care. This time includes time for face to face visit via telemedicine, review of medical records, imaging studies and discussion of findings with providers, the patient and/or family.   Dr Carson Myrtle Nikoloz Huy   [facility] [phone]  Case [id number]   ADDENDUM: CTA Head/Neck shows no large vessel occlusion as per radiology. No additional recommendations, proceed with workup as above.

## 2020-01-29 NOTE — ED Notes (Signed)
Called [facility] per Dr Adela Lank (Neuro) Consult

## 2020-01-29 NOTE — ED Provider Notes (Signed)
[facility] EMERGENCY DEPARTMENT Provider Note   CSN: [id number] Arrival date & time: 01/29/20  2150  An emergency department physician performed an initial assessment on this suspected stroke patient at 2207.  History Chief Complaint  Patient presents with  . Emesis    Judy Gutierrez is a 59 y.o. female.  59 yo F with a chief complaints of vertigo.  This was sudden in onset and happened just shy of 3 hours ago.  She was sitting in a swinging chair and suddenly felt like the room was spinning.  She ended up having some episodes of nausea and vomiting.  This improved but not completely resolved with eye closure and laying down.  She had called her friend who is a doctor who suggested that she try and rest.  Unfortunately she started having uncontrollable vomiting.  She denies headache or neck pain.  Denies head trauma.  Denies one-sided numbness or weakness denies difficulty with speech or swallowing.  The history is provided by the patient.  Emesis Associated symptoms: no abdominal pain, no arthralgias, no chills, no fever, no headaches and no myalgias   Illness Severity:  Severe Onset quality:  Sudden Duration:  3 hours Timing:  Constant Progression:  Unchanged Chronicity:  New Associated symptoms: nausea and vomiting   Associated symptoms: no abdominal pain, no chest pain, no congestion, no fever, no headaches, no myalgias, no rhinorrhea, no shortness of breath and no wheezing        Past Medical History:  Diagnosis Date  . Disturbance of skin sensation   . Myalgia and myositis, unspecified   . Other B-complex deficiencies   . 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 Unspecified essential hypertension     Patient Active Problem List   Diagnosis Date Noted  . 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 Musculoskeletal chest pain 09/10/2014  . Hand pain 02/12/2014  . Fracture of left ulna 11/05/2013  . E.  coli UTI 11/01/2013  . Anemia due to blood loss, acute 10/24/2013  . Pelvic fracture (HCC) 10/23/2013  . Hyperglycemia 09/18/2013  . Routine general medical examination at a health care facility 09/18/2013  . Elevated alkaline phosphatase level 09/18/2013  . Hot flashes, menopausal 04/10/2013  . Pain in joint, upper arm 04/10/2013    Past Surgical History:  Procedure Laterality Date  . CESAREAN SECTION  1992     OB History   No obstetric history on file.     Family History  Problem Relation Age of Onset  . Pulmonary embolism Mother   . Hepatitis Father   . Diabetes Mellitus II Sister   . Breast cancer Neg Hx     Social History   Tobacco Use  . Smoking status: Never Smoker  . Smokeless tobacco: Never Used  Substance Use Topics  . Alcohol use: No  . Drug use: No    Home Medications Prior to Admission medications   Medication Sig Start Date End Date Taking? Authorizing Provider  calcium carbonate (OS-CAL) 600 MG TABS Take 600 mg by mouth daily.    [provider]  Multiple Vitamins-Minerals (MULTIVITAMIN WITH MINERALS) tablet Take 1 tablet by mouth daily.    [provider]    Allergies    Patient has no known allergies.  Review of Systems   Review of Systems  Constitutional: Negative for chills and fever.  HENT: Negative for congestion and rhinorrhea.   Eyes: Negative for redness and visual disturbance.  Respiratory: Negative for shortness of breath and wheezing.   Cardiovascular: Negative for chest pain and palpitations.  Gastrointestinal: Positive for nausea and vomiting. Negative for abdominal pain.  Genitourinary: Negative for dysuria and urgency.  Musculoskeletal: Negative for arthralgias and myalgias.  Skin: Negative for pallor and wound.  Neurological: Positive for dizziness. Negative for headaches.    Physical Exam Updated Vital Signs BP 137/71 (BP Location: Right Arm)   Pulse 65   Temp 98.3 F (36.8 C) (Oral)   Resp 18   Ht  5' 1.5" (1.562 m)   Wt 63.5 kg   LMP 12/08/2010   SpO2 96%   BMI 26.02 kg/m   Physical Exam Vitals and nursing note reviewed.  Constitutional:      General: She is not in acute distress.    Appearance: She is well-developed. She is not diaphoretic.  HENT:     Head: Normocephalic and atraumatic.  Eyes:     Pupils: Pupils are equal, round, and reactive to light.  Cardiovascular:     Rate and Rhythm: Normal rate and regular rhythm.     Heart sounds: No murmur. No friction rub. No gallop.   Pulmonary:     Effort: Pulmonary effort is normal.     Breath sounds: No wheezing or rales.  Abdominal:     General: There is no distension.     Palpations: Abdomen is soft.     Tenderness: There is no abdominal tenderness.  Musculoskeletal:        General: No tenderness.     Cervical back: Normal range of motion and neck supple.  Skin:    General: Skin is warm and dry.  Neurological:     Mental Status: She is alert and oriented to person, place, and time.     GCS: GCS eye subscore is 4. GCS verbal subscore is 5. GCS motor subscore is 6.     Comments: Left-sided fast going nystagmus.  Ataxia with the left upper extremity on finger-to-nose.  Strength appears to be intact and equal.  Psychiatric:        Behavior: Behavior normal.     ED Results / Procedures / Treatments   Labs (all labs ordered are listed, but only abnormal results are displayed) Labs Reviewed  CBC WITH DIFFERENTIAL/PLATELET - Abnormal; Notable for the following components:      Result Value   Hemoglobin 11.9 (*)    All other components within normal limits  COMPREHENSIVE METABOLIC PANEL - Abnormal; Notable for the following components:   Potassium 3.1 (*)    Glucose, Bld 153 (*)    Calcium 8.6 (*)    AST 42 (*)    All other components within normal limits  URINALYSIS, ROUTINE W REFLEX MICROSCOPIC - Abnormal; Notable for the following components:   Glucose, UA 100 (*)    Ketones, ur 15 (*)    Leukocytes,Ua SMALL  (*)    All other components within normal limits  URINALYSIS, MICROSCOPIC (REFLEX) - Abnormal; Notable for the following components:   Bacteria, UA RARE (*)    All other components within normal limits  RESPIRATORY PANEL BY RT PCR (FLU A&B, COVID)  SARS CORONAVIRUS 2 AG (30 MIN TAT)  MRSA PCR SCREENING  ETHANOL  PROTIME-INR  APTT  RAPID URINE DRUG SCREEN, HOSP PERFORMED  PREGNANCY, URINE  HIV ANTIBODY (ROUTINE TESTING W REFLEX)  HEMOGLOBIN A1C  LIPID PANEL  CBG MONITORING, ED    EKG None  Radiology CT Code Stroke CTA Head W/WO contrast  Result Date: 01/30/2020 CLINICAL DATA:  Dizziness and nausea EXAM: CT ANGIOGRAPHY HEAD AND NECK TECHNIQUE: Multidetector CT imaging of the head and neck was performed using the standard protocol during bolus administration of intravenous contrast. Multiplanar CT image reconstructions and MIPs were obtained to evaluate the vascular anatomy. Carotid stenosis measurements (when applicable) are obtained utilizing NASCET criteria, using the distal internal carotid diameter as the denominator. CONTRAST:  OMNIPAQUE IOHEXOL 350 MG/ML SOLN COMPARISON:  None. FINDINGS: CT HEAD FINDINGS Brain: There is no mass, hemorrhage or extra-axial collection. The size and configuration of the ventricles and extra-axial CSF spaces are normal. There is no acute or chronic infarction. The brain parenchyma is normal. Skull: The visualized skull base, calvarium and extracranial soft tissues are normal. Sinuses/Orbits: No fluid levels or advanced mucosal thickening of the visualized paranasal sinuses. No mastoid or middle ear effusion. The orbits are normal. CTA NECK FINDINGS SKELETON: There is no bony spinal canal stenosis. No lytic or blastic lesion. OTHER NECK: Normal pharynx, larynx and major salivary glands. No cervical lymphadenopathy. Unremarkable thyroid gland. UPPER CHEST: No pneumothorax or pleural effusion. No nodules or masses. AORTIC ARCH: There is no calcific  atherosclerosis of the aortic arch. There is no aneurysm, dissection or hemodynamically significant stenosis of the visualized portion of the aorta. Normal variant aortic arch branching pattern with the left vertebral artery arising independently from the aortic arch. The visualized proximal subclavian arteries are widely patent. RIGHT CAROTID SYSTEM: Normal without aneurysm, dissection or stenosis. LEFT CAROTID SYSTEM: Normal without aneurysm, dissection or stenosis. VERTEBRAL ARTERIES: Left dominant configuration. Both origins are clearly patent. There is no dissection, occlusion or flow-limiting stenosis to the skull base (V1-V3 segments). CTA HEAD FINDINGS POSTERIOR CIRCULATION: --Vertebral arteries: Normal V4 segments. --Posterior inferior cerebellar arteries (PICA): Patent origins from the vertebral arteries. --Anterior inferior cerebellar arteries (AICA): Patent origins from the basilar artery. --Basilar artery: Normal. --Superior cerebellar arteries: Normal. --Posterior cerebral arteries: Normal. Both are predominantly supplied by the posterior communicating arteries (p-comm). ANTERIOR CIRCULATION: --Intracranial internal carotid arteries: Normal. --Anterior cerebral arteries (ACA): Normal. Both A1 segments are present. Patent anterior communicating artery (a-comm). --Middle cerebral arteries (MCA): Normal. VENOUS SINUSES: As permitted by contrast timing, patent. ANATOMIC VARIANTS: Fetal origins of both posterior cerebral arteries. Review of the MIP images confirms the above findings. IMPRESSION: Normal CTA of the head and neck. Electronically Signed   By: Deatra Robinson M.D.   On: 01/30/2020 00:09   CT Code Stroke CTA Neck W/WO contrast  Result Date: 01/30/2020 CLINICAL DATA:  Dizziness and nausea EXAM: CT ANGIOGRAPHY HEAD AND NECK TECHNIQUE: Multidetector CT imaging of the head and neck was performed using the standard protocol during bolus administration of intravenous contrast. Multiplanar CT image  reconstructions and MIPs were obtained to evaluate the vascular anatomy. Carotid stenosis measurements (when applicable) are obtained utilizing NASCET criteria, using the distal internal carotid diameter as the denominator. CONTRAST:  OMNIPAQUE IOHEXOL 350 MG/ML SOLN COMPARISON:  None. FINDINGS: CT HEAD FINDINGS Brain: There is no mass, hemorrhage or extra-axial collection. The size and configuration of the ventricles and extra-axial CSF spaces are normal. There is no acute or chronic infarction. The brain parenchyma is normal. Skull: The visualized skull base, calvarium and extracranial soft tissues are normal. Sinuses/Orbits: No fluid levels or advanced mucosal thickening of the visualized paranasal sinuses. No mastoid or middle ear effusion. The orbits are normal. CTA NECK FINDINGS SKELETON: There is no bony spinal canal stenosis. No lytic or blastic lesion. OTHER NECK: Normal pharynx,  larynx and major salivary glands. No cervical lymphadenopathy. Unremarkable thyroid gland. UPPER CHEST: No pneumothorax or pleural effusion. No nodules or masses. AORTIC ARCH: There is no calcific atherosclerosis of the aortic arch. There is no aneurysm, dissection or hemodynamically significant stenosis of the visualized portion of the aorta. Normal variant aortic arch branching pattern with the left vertebral artery arising independently from the aortic arch. The visualized proximal subclavian arteries are widely patent. RIGHT CAROTID SYSTEM: Normal without aneurysm, dissection or stenosis. LEFT CAROTID SYSTEM: Normal without aneurysm, dissection or stenosis. VERTEBRAL ARTERIES: Left dominant configuration. Both origins are clearly patent. There is no dissection, occlusion or flow-limiting stenosis to the skull base (V1-V3 segments). CTA HEAD FINDINGS POSTERIOR CIRCULATION: --Vertebral arteries: Normal V4 segments. --Posterior inferior cerebellar arteries (PICA): Patent origins from the vertebral arteries. --Anterior  inferior cerebellar arteries (AICA): Patent origins from the basilar artery. --Basilar artery: Normal. --Superior cerebellar arteries: Normal. --Posterior cerebral arteries: Normal. Both are predominantly supplied by the posterior communicating arteries (p-comm). ANTERIOR CIRCULATION: --Intracranial internal carotid arteries: Normal. --Anterior cerebral arteries (ACA): Normal. Both A1 segments are present. Patent anterior communicating artery (a-comm). --Middle cerebral arteries (MCA): Normal. VENOUS SINUSES: As permitted by contrast timing, patent. ANATOMIC VARIANTS: Fetal origins of both posterior cerebral arteries. Review of the MIP images confirms the above findings. IMPRESSION: Normal CTA of the head and neck. Electronically Signed   By: Deatra RobinsonKevin  Herman M.D.   On: 01/30/2020 00:09   CT HEAD CODE STROKE WO CONTRAST  Result Date: 01/29/2020 CLINICAL DATA:  Code stroke.  Nausea and dizziness EXAM: CT HEAD WITHOUT CONTRAST TECHNIQUE: Contiguous axial images were obtained from the base of the skull through the vertex without intravenous contrast. COMPARISON:  None. FINDINGS: Brain: There is no mass, hemorrhage or extra-axial collection. The size and configuration of the ventricles and extra-axial CSF spaces are normal. The brain parenchyma is normal, without evidence of acute or chronic infarction. Vascular: No abnormal hyperdensity of the major intracranial arteries or dural venous sinuses. No intracranial atherosclerosis. Skull: The visualized skull base, calvarium and extracranial soft tissues are normal. Small right frontal osteoma. Sinuses/Orbits: No fluid levels or advanced mucosal thickening of the visualized paranasal sinuses. No mastoid or middle ear effusion. The orbits are normal. ASPECTS [facility]([place] Stroke Program Early CT Score) - Ganglionic level infarction (caudate, lentiform nuclei, internal capsule, insula, M1-M3 cortex): 7 - Supraganglionic infarction (M4-M6 cortex): 3 Total score (0-10 with 10  being normal): 10 IMPRESSION: 1. Normal head CT 2. ASPECTS is 10. 3. These results were called by telephone at the time of interpretation on 01/29/2020 at 10:24 pm to provider Evart Mcdonnell , who verbally acknowledged these results. Electronically Signed   By: Deatra RobinsonKevin  Herman M.D.   On: 01/29/2020 22:24    Procedures Procedures (including critical care time)  Medications Ordered in ED Medications  prochlorperazine (COMPAZINE) injection 10 mg (10 mg Intravenous Not Given 01/30/20 0012)  diphenhydrAMINE (BENADRYL) injection 25 mg (25 mg Intravenous Not Given 01/30/20 0012)  labetalol (NORMODYNE) injection 10 mg (10 mg Intravenous Not Given 01/30/20 0015)   stroke: mapping our early stages of recovery book (has no administration in time range)  0.9 %  sodium chloride infusion ( Intravenous New Bag/Given 01/30/20 0351)  acetaminophen (TYLENOL) tablet 650 mg (has no administration in time range)    Or  acetaminophen (TYLENOL) 160 MG/5ML solution 650 mg (has no administration in time range)    Or  acetaminophen (TYLENOL) suppository 650 mg (has no administration in time range)  senna-docusate (Senokot-S) tablet  1 tablet (has no administration in time range)  pantoprazole (PROTONIX) injection 40 mg (has no administration in time range)  clevidipine (CLEVIPREX) infusion 0.5 mg/mL (0 mg/hr Intravenous Hold 01/30/20 0434)  sodium chloride 0.9 % bolus 1,000 mL ( Intravenous Stopped 01/29/20 2341)  alteplase (ACTIVASE) 1 mg/mL infusion 57.2 mg (0 mg Intravenous Stopped 01/30/20 0034)    Followed by  0.9 %  sodium chloride infusion ( Intravenous Stopped 01/30/20 0123)  iohexol (OMNIPAQUE) 350 MG/ML injection 100 mL (100 mLs Intravenous Contrast Given 01/29/20 2339)  labetalol (NORMODYNE) 5 MG/ML injection (10 mg  Given 01/29/20 2336)  ondansetron (ZOFRAN) injection 4 mg (4 mg Intravenous Given 01/30/20 0205)    ED Course  I have reviewed the triage vital signs and the nursing notes.  Pertinent labs & imaging  results that were available during my care of the patient were reviewed by me and considered in my medical decision making (see chart for details).    MDM Rules/Calculators/A&P                      59 yo F with a chief complaint of acute onset dizziness.  Patient has ataxia with the left upper extremity on my exam making this more concerning for an acute stroke.  She is currently within the window and so I will activate, teleneurology is the modality we have at this location.  Patient is actively dizzy and vomiting, will give Compazine and Benadryl.  Seen by teleneurology concern for acute stroke CT scan of the head without contrast without bleed.  Recommending give TPA.  Will discuss with neurology at [facility] for transfer.   CRITICAL CARE Performed by: Rae Roam   Total critical care time: 35 minutes  Critical care time was exclusive of separately billable procedures and treating other patients.  Critical care was necessary to treat or prevent imminent or life-threatening deterioration.  Critical care was time spent personally by me on the following activities: development of treatment plan with patient and/or surrogate as well as nursing, discussions with consultants, evaluation of patient's response to treatment, examination of patient, obtaining history from patient or surrogate, ordering and performing treatments and interventions, ordering and review of laboratory studies, ordering and review of radiographic studies, pulse oximetry and re-evaluation of patient's condition.   The patients results and plan were reviewed and discussed.   Any x-rays performed were independently reviewed by myself.   Differential diagnosis were considered with the presenting HPI.  Medications  prochlorperazine (COMPAZINE) injection 10 mg (10 mg Intravenous Not Given 01/30/20 0012)  diphenhydrAMINE (BENADRYL) injection 25 mg (25 mg Intravenous Not Given 01/30/20 0012)  labetalol (NORMODYNE)  injection 10 mg (10 mg Intravenous Not Given 01/30/20 0015)   stroke: mapping our early stages of recovery book (has no administration in time range)  0.9 %  sodium chloride infusion ( Intravenous New Bag/Given 01/30/20 0351)  acetaminophen (TYLENOL) tablet 650 mg (has no administration in time range)    Or  acetaminophen (TYLENOL) 160 MG/5ML solution 650 mg (has no administration in time range)    Or  acetaminophen (TYLENOL) suppository 650 mg (has no administration in time range)  senna-docusate (Senokot-S) tablet 1 tablet (has no administration in time range)  pantoprazole (PROTONIX) injection 40 mg (has no administration in time range)  clevidipine (CLEVIPREX) infusion 0.5 mg/mL (0 mg/hr Intravenous Hold 01/30/20 0434)  sodium chloride 0.9 % bolus 1,000 mL ( Intravenous Stopped 01/29/20 2341)  alteplase (ACTIVASE) 1 mg/mL infusion 57.2 mg (  0 mg Intravenous Stopped 01/30/20 0034)    Followed by  0.9 %  sodium chloride infusion ( Intravenous Stopped 01/30/20 0123)  iohexol (OMNIPAQUE) 350 MG/ML injection 100 mL (100 mLs Intravenous Contrast Given 01/29/20 2339)  labetalol (NORMODYNE) 5 MG/ML injection (10 mg  Given 01/29/20 2336)  ondansetron (ZOFRAN) injection 4 mg (4 mg Intravenous Given 01/30/20 0205)    Vitals:   01/30/20 0645 01/30/20 0700 01/30/20 0727 01/30/20 0800  BP: 132/67 121/71  137/71  Pulse: 64 65    Resp: 20 19 20 18   Temp: 98.3 F (36.8 C)     TempSrc: Oral     SpO2: 98% 96%    Weight:      Height:        Final diagnoses:  Vertigo  Ataxia of left upper extremity  Posterior circulation stroke (HCC)  Status post administration of tPA (rtPA) in a different facility within the last 24 hours prior to admission to current facility    Admission/ observation were discussed with the admitting physician, patient and/or family and they are comfortable with the plan.   Final Clinical Impression(s) / ED Diagnoses Final diagnoses:  Vertigo  Ataxia of left upper extremity    Posterior circulation stroke (HCC)  Status post administration of tPA (rtPA) in a different facility within the last 24 hours prior to admission to current facility    Rx / DC Orders ED Discharge Orders    None       , DO 01/30/20 02/01/20

## 2020-01-30 ENCOUNTER — Inpatient Hospital Stay (HOSPITAL_COMMUNITY): Payer: No Typology Code available for payment source

## 2020-01-30 DIAGNOSIS — Z79899 Other long term (current) drug therapy: Secondary | ICD-10-CM | POA: Diagnosis not present

## 2020-01-30 DIAGNOSIS — I340 Nonrheumatic mitral (valve) insufficiency: Secondary | ICD-10-CM

## 2020-01-30 DIAGNOSIS — E785 Hyperlipidemia, unspecified: Secondary | ICD-10-CM | POA: Diagnosis present

## 2020-01-30 DIAGNOSIS — Z9282 Status post administration of tPA (rtPA) in a different facility within the last 24 hours prior to admission to current facility: Secondary | ICD-10-CM | POA: Diagnosis not present

## 2020-01-30 DIAGNOSIS — R29700 NIHSS score 0: Secondary | ICD-10-CM | POA: Diagnosis present

## 2020-01-30 DIAGNOSIS — I361 Nonrheumatic tricuspid (valve) insufficiency: Secondary | ICD-10-CM | POA: Diagnosis not present

## 2020-01-30 DIAGNOSIS — I10 Essential (primary) hypertension: Secondary | ICD-10-CM | POA: Diagnosis present

## 2020-01-30 DIAGNOSIS — R270 Ataxia, unspecified: Secondary | ICD-10-CM | POA: Diagnosis present

## 2020-01-30 DIAGNOSIS — I639 Cerebral infarction, unspecified: Secondary | ICD-10-CM | POA: Diagnosis present

## 2020-01-30 DIAGNOSIS — I6340 Cerebral infarction due to embolism of unspecified cerebral artery: Secondary | ICD-10-CM | POA: Diagnosis present

## 2020-01-30 DIAGNOSIS — R42 Dizziness and giddiness: Secondary | ICD-10-CM | POA: Diagnosis present

## 2020-01-30 DIAGNOSIS — I6350 Cerebral infarction due to unspecified occlusion or stenosis of unspecified cerebral artery: Secondary | ICD-10-CM | POA: Diagnosis not present

## 2020-01-30 DIAGNOSIS — E7800 Pure hypercholesterolemia, unspecified: Secondary | ICD-10-CM | POA: Diagnosis not present

## 2020-01-30 DIAGNOSIS — E876 Hypokalemia: Secondary | ICD-10-CM | POA: Diagnosis present

## 2020-01-30 DIAGNOSIS — Z20822 Contact with and (suspected) exposure to covid-19: Secondary | ICD-10-CM | POA: Diagnosis present

## 2020-01-30 LAB — URINALYSIS, ROUTINE W REFLEX MICROSCOPIC
Bilirubin Urine: NEGATIVE
Glucose, UA: 100 mg/dL — AB
Hgb urine dipstick: NEGATIVE
Ketones, ur: 15 mg/dL — AB
Nitrite: NEGATIVE
Protein, ur: NEGATIVE mg/dL
Specific Gravity, Urine: 1.01 (ref 1.005–1.030)
pH: 7.5 (ref 5.0–8.0)

## 2020-01-30 LAB — LIPID PANEL
Cholesterol: 202 mg/dL — ABNORMAL HIGH (ref 0–200)
HDL: 59 mg/dL (ref >40)
LDL Cholesterol: 138 mg/dL — ABNORMAL HIGH (ref 0–99)
Total CHOL/HDL Ratio: 3.4 RATIO
Triglycerides: 27 mg/dL (ref <150)
VLDL: 5 mg/dL (ref 0–40)

## 2020-01-30 LAB — PREGNANCY, URINE: Preg Test, Ur: NEGATIVE

## 2020-01-30 LAB — RAPID URINE DRUG SCREEN, HOSP PERFORMED
Amphetamines: NOT DETECTED
Barbiturates: NOT DETECTED
Benzodiazepines: NOT DETECTED
Cocaine: NOT DETECTED
Opiates: NOT DETECTED
Tetrahydrocannabinol: NOT DETECTED

## 2020-01-30 LAB — ECHOCARDIOGRAM COMPLETE
Height: 61.5 in
Weight: 2240 oz

## 2020-01-30 LAB — URINALYSIS, MICROSCOPIC (REFLEX)

## 2020-01-30 LAB — SARS CORONAVIRUS 2 AG (30 MIN TAT): SARS Coronavirus 2 Ag: NEGATIVE

## 2020-01-30 LAB — MRSA PCR SCREENING: MRSA by PCR: NEGATIVE

## 2020-01-30 LAB — RESPIRATORY PANEL BY RT PCR (FLU A&B, COVID)
Influenza A by PCR: NEGATIVE
Influenza B by PCR: NEGATIVE
SARS Coronavirus 2 by RT PCR: NEGATIVE

## 2020-01-30 LAB — HEMOGLOBIN A1C
Hgb A1c MFr Bld: 6 % — ABNORMAL HIGH (ref 4.8–5.6)
Mean Plasma Glucose: 125.5 mg/dL

## 2020-01-30 LAB — HIV ANTIBODY (ROUTINE TESTING W REFLEX): HIV Screen 4th Generation wRfx: NONREACTIVE

## 2020-01-30 MED ORDER — ACETAMINOPHEN 160 MG/5ML PO SOLN: 650.0000 mg | ORAL | Status: DC | PRN

## 2020-01-30 MED ORDER — ACETAMINOPHEN 650 MG RE SUPP: 650.0000 mg | RECTAL | Status: DC | PRN

## 2020-01-30 MED ORDER — ATORVASTATIN CALCIUM 40 MG PO TABS
40.0000 mg | ORAL_TABLET | Freq: Every day | ORAL | Status: DC
  Administered 2020-01-30 – 2020-02-01 (×3): 40 mg via ORAL
  Filled 2020-01-30 (×3): qty 1

## 2020-01-30 MED ORDER — PANTOPRAZOLE SODIUM 40 MG IV SOLR
40.0000 mg | Freq: Every day | INTRAVENOUS | Status: DC
  Administered 2020-01-30: 40 mg via INTRAVENOUS
  Filled 2020-01-30: qty 40

## 2020-01-30 MED ORDER — SODIUM CHLORIDE 0.9 % IV SOLN: INTRAVENOUS | Status: AC

## 2020-01-30 MED ORDER — CHLORHEXIDINE GLUCONATE CLOTH 2 % EX PADS
6.0000 | MEDICATED_PAD | Freq: Every day | CUTANEOUS | Status: DC
  Administered 2020-01-30 – 2020-02-01 (×2): 6 via TOPICAL

## 2020-01-30 MED ORDER — CLEVIDIPINE BUTYRATE 0.5 MG/ML IV EMUL
0.0000 mg/h | INTRAVENOUS | Status: DC
  Filled 2020-01-30: qty 50

## 2020-01-30 MED ORDER — ONDANSETRON HCL 4 MG/2ML IJ SOLN
4.0000 mg | Freq: Once | INTRAMUSCULAR | Status: AC
  Administered 2020-01-30: 4 mg via INTRAVENOUS
  Filled 2020-01-30: qty 2

## 2020-01-30 MED ORDER — ACETAMINOPHEN 325 MG PO TABS: 650.0000 mg | ORAL_TABLET | ORAL | Status: DC | PRN

## 2020-01-30 MED ORDER — LORAZEPAM 2 MG/ML IJ SOLN
1.0000 mg | Freq: Once | INTRAMUSCULAR | Status: AC
  Administered 2020-01-30: 1 mg via INTRAVENOUS
  Filled 2020-01-30: qty 1

## 2020-01-30 MED ORDER — SENNOSIDES-DOCUSATE SODIUM 8.6-50 MG PO TABS
1.0000 | ORAL_TABLET | Freq: Every evening | ORAL | Status: DC | PRN
  Administered 2020-01-31: 1 via ORAL
  Filled 2020-01-30: qty 1

## 2020-01-30 MED ORDER — STROKE: EARLY STAGES OF RECOVERY BOOK
Freq: Once | Status: DC
  Filled 2020-01-30: qty 1

## 2020-01-30 MED ORDER — POTASSIUM CHLORIDE 10 MEQ/100ML IV SOLN
10.0000 meq | INTRAVENOUS | Status: AC
  Administered 2020-01-30 (×4): 10 meq via INTRAVENOUS
  Filled 2020-01-30: qty 100

## 2020-01-30 NOTE — Progress Notes (Signed)
OT Cancellation    01/30/20 0900  OT Visit Information  Last OT Received On 01/30/20  Reason Eval/Treat Not Completed Active bedrest order  Luisa Dago, OT/L   Acute OT Clinical Specialist Acute Rehabilitation Services Pager [phone] Office [phone]

## 2020-01-30 NOTE — Progress Notes (Addendum)
STROKE TEAM PROGRESS NOTE   INTERVAL HISTORY Her husband is at the bedside. Her symptoms have completely resolved since IV tPA was given. Stroke wk up underway. NIHSS is 0.  I personally reviewed history of presenting illness with the patient and husband, electronic medical records and imaging films in PACS.  She presented with sudden onset of vertigo, double vision dizziness and gait ataxia which lasted at least for about an hour and improved after she got IV TPA after consultation with telemetry neurologist.  She denies prior history of strokes TIAs or significant known vascular risk factors but has been found to be mildly hypertensive since admission.  Vitals:   01/30/20 0900 01/30/20 1000 01/30/20 1146 01/30/20 1200  BP: (!) 146/70 (!) 172/82  (!) 148/62  Pulse: 66 73  65  Resp: 20 (!) 24  20  Temp:   98.2 F (36.8 C)   TempSrc:   Oral   SpO2: 96% 98%  96%  Weight:      Height:        CBC:  Recent Labs  Lab 01/29/20 2204  WBC 9.1  NEUTROABS 5.7  HGB 11.9*  HCT 37.8  MCV 84.0  PLT 242    Basic Metabolic Panel:  Recent Labs  Lab 01/29/20 2234  NA 138  K 3.1*  CL 103  CO2 24  GLUCOSE 153*  BUN 16  CREATININE 0.48  CALCIUM 8.6*   Lipid Panel:     Component Value Date/Time   CHOL 202 (H) 01/30/2020 1021   CHOL 191 09/09/2015 0804   TRIG 27 01/30/2020 1021   HDL 59 01/30/2020 1021   HDL 60 09/09/2015 0804   CHOLHDL 3.4 01/30/2020 1021   VLDL 5 01/30/2020 1021   LDLCALC 138 (H) 01/30/2020 1021   LDLCALC 120 (H) 09/09/2015 0804   HgbA1c:  Lab Results  Component Value Date   HGBA1C 6.0 (H) 01/30/2020   Urine Drug Screen:     Component Value Date/Time   LABOPIA NONE DETECTED 01/29/2020 2358   COCAINSCRNUR NONE DETECTED 01/29/2020 2358   LABBENZ NONE DETECTED 01/29/2020 2358   AMPHETMU NONE DETECTED 01/29/2020 2358   THCU NONE DETECTED 01/29/2020 2358   LABBARB NONE DETECTED 01/29/2020 2358    Alcohol Level     Component Value Date/Time   ETH <10  01/29/2020 2207    IMAGING past 24 hours CT Code Stroke CTA Head W/WO contrast  Result Date: 01/30/2020 CLINICAL DATA:  Dizziness and nausea EXAM: CT ANGIOGRAPHY HEAD AND NECK TECHNIQUE: Multidetector CT imaging of the head and neck was performed using the standard protocol during bolus administration of intravenous contrast. Multiplanar CT image reconstructions and MIPs were obtained to evaluate the vascular anatomy. Carotid stenosis measurements (when applicable) are obtained utilizing NASCET criteria, using the distal internal carotid diameter as the denominator. CONTRAST:  172mL OMNIPAQUE IOHEXOL 350 MG/ML SOLN COMPARISON:  None. FINDINGS: CT HEAD FINDINGS Brain: There is no mass, hemorrhage or extra-axial collection. The size and configuration of the ventricles and extra-axial CSF spaces are normal. There is no acute or chronic infarction. The brain parenchyma is normal. Skull: The visualized skull base, calvarium and extracranial soft tissues are normal. Sinuses/Orbits: No fluid levels or advanced mucosal thickening of the visualized paranasal sinuses. No mastoid or middle ear effusion. The orbits are normal. CTA NECK FINDINGS SKELETON: There is no bony spinal canal stenosis. No lytic or blastic lesion. OTHER NECK: Normal pharynx, larynx and major salivary glands. No cervical lymphadenopathy. Unremarkable thyroid gland. UPPER CHEST:  No pneumothorax or pleural effusion. No nodules or masses. AORTIC ARCH: There is no calcific atherosclerosis of the aortic arch. There is no aneurysm, dissection or hemodynamically significant stenosis of the visualized portion of the aorta. Normal variant aortic arch branching pattern with the left vertebral artery arising independently from the aortic arch. The visualized proximal subclavian arteries are widely patent. RIGHT CAROTID SYSTEM: Normal without aneurysm, dissection or stenosis. LEFT CAROTID SYSTEM: Normal without aneurysm, dissection or stenosis. VERTEBRAL  ARTERIES: Left dominant configuration. Both origins are clearly patent. There is no dissection, occlusion or flow-limiting stenosis to the skull base (V1-V3 segments). CTA HEAD FINDINGS POSTERIOR CIRCULATION: --Vertebral arteries: Normal V4 segments. --Posterior inferior cerebellar arteries (PICA): Patent origins from the vertebral arteries. --Anterior inferior cerebellar arteries (AICA): Patent origins from the basilar artery. --Basilar artery: Normal. --Superior cerebellar arteries: Normal. --Posterior cerebral arteries: Normal. Both are predominantly supplied by the posterior communicating arteries (p-comm). ANTERIOR CIRCULATION: --Intracranial internal carotid arteries: Normal. --Anterior cerebral arteries (ACA): Normal. Both A1 segments are present. Patent anterior communicating artery (a-comm). --Middle cerebral arteries (MCA): Normal. VENOUS SINUSES: As permitted by contrast timing, patent. ANATOMIC VARIANTS: Fetal origins of both posterior cerebral arteries. Review of the MIP images confirms the above findings. IMPRESSION: Normal CTA of the head and neck. Electronically Signed   By: Deatra Robinson M.D.   On: 01/30/2020 00:09   CT Code Stroke CTA Neck W/WO contrast  Result Date: 01/30/2020 CLINICAL DATA:  Dizziness and nausea EXAM: CT ANGIOGRAPHY HEAD AND NECK TECHNIQUE: Multidetector CT imaging of the head and neck was performed using the standard protocol during bolus administration of intravenous contrast. Multiplanar CT image reconstructions and MIPs were obtained to evaluate the vascular anatomy. Carotid stenosis measurements (when applicable) are obtained utilizing NASCET criteria, using the distal internal carotid diameter as the denominator. CONTRAST:  OMNIPAQUE IOHEXOL 350 MG/ML SOLN COMPARISON:  None. FINDINGS: CT HEAD FINDINGS Brain: There is no mass, hemorrhage or extra-axial collection. The size and configuration of the ventricles and extra-axial CSF spaces are normal. There is no acute  or chronic infarction. The brain parenchyma is normal. Skull: The visualized skull base, calvarium and extracranial soft tissues are normal. Sinuses/Orbits: No fluid levels or advanced mucosal thickening of the visualized paranasal sinuses. No mastoid or middle ear effusion. The orbits are normal. CTA NECK FINDINGS SKELETON: There is no bony spinal canal stenosis. No lytic or blastic lesion. OTHER NECK: Normal pharynx, larynx and major salivary glands. No cervical lymphadenopathy. Unremarkable thyroid gland. UPPER CHEST: No pneumothorax or pleural effusion. No nodules or masses. AORTIC ARCH: There is no calcific atherosclerosis of the aortic arch. There is no aneurysm, dissection or hemodynamically significant stenosis of the visualized portion of the aorta. Normal variant aortic arch branching pattern with the left vertebral artery arising independently from the aortic arch. The visualized proximal subclavian arteries are widely patent. RIGHT CAROTID SYSTEM: Normal without aneurysm, dissection or stenosis. LEFT CAROTID SYSTEM: Normal without aneurysm, dissection or stenosis. VERTEBRAL ARTERIES: Left dominant configuration. Both origins are clearly patent. There is no dissection, occlusion or flow-limiting stenosis to the skull base (V1-V3 segments). CTA HEAD FINDINGS POSTERIOR CIRCULATION: --Vertebral arteries: Normal V4 segments. --Posterior inferior cerebellar arteries (PICA): Patent origins from the vertebral arteries. --Anterior inferior cerebellar arteries (AICA): Patent origins from the basilar artery. --Basilar artery: Normal. --Superior cerebellar arteries: Normal. --Posterior cerebral arteries: Normal. Both are predominantly supplied by the posterior communicating arteries (p-comm). ANTERIOR CIRCULATION: --Intracranial internal carotid arteries: Normal. --Anterior cerebral arteries (ACA): Normal. Both A1 segments are  present. Patent anterior communicating artery (a-comm). --Middle cerebral arteries (MCA):  Normal. VENOUS SINUSES: As permitted by contrast timing, patent. ANATOMIC VARIANTS: Fetal origins of both posterior cerebral arteries. Review of the MIP images confirms the above findings. IMPRESSION: Normal CTA of the head and neck. Electronically Signed   By: Deatra Robinson M.D.   On: 01/30/2020 00:09   CT HEAD CODE STROKE WO CONTRAST  Result Date: 01/29/2020 CLINICAL DATA:  Code stroke.  Nausea and dizziness EXAM: CT HEAD WITHOUT CONTRAST TECHNIQUE: Contiguous axial images were obtained from the base of the skull through the vertex without intravenous contrast. COMPARISON:  None. FINDINGS: Brain: There is no mass, hemorrhage or extra-axial collection. The size and configuration of the ventricles and extra-axial CSF spaces are normal. The brain parenchyma is normal, without evidence of acute or chronic infarction. Vascular: No abnormal hyperdensity of the major intracranial arteries or dural venous sinuses. No intracranial atherosclerosis. Skull: The visualized skull base, calvarium and extracranial soft tissues are normal. Small right frontal osteoma. Sinuses/Orbits: No fluid levels or advanced mucosal thickening of the visualized paranasal sinuses. No mastoid or middle ear effusion. The orbits are normal. ASPECTS [facility] Stroke Program Early CT Score) - Ganglionic level infarction (caudate, lentiform nuclei, internal capsule, insula, M1-M3 cortex): 7 - Supraganglionic infarction (M4-M6 cortex): 3 Total score (0-10 with 10 being normal): 10 IMPRESSION: 1. Normal head CT 2. ASPECTS is 10. 3. These results were called by telephone at the time of interpretation on 01/29/2020 at 10:24 pm to provider DAN FLOYD , who verbally acknowledged these results. Electronically Signed   By: Deatra Robinson M.D.   On: 01/29/2020 22:24    PHYSICAL EXAM Pleasant middle-aged [place] origin lady not in distress GENERAL- no acute distress HEENT-  [place]/AT  Lungs - Respirations unlabored Abdomen -  Nondistended Extremities - No edema  Neurologic Examination: Mental Status:  Alert, fully oriented, thought content appropriate.  Speech fluent without evidence of aphasia.  Able to follow all commands without difficulty. Cranial Nerves: II:  Visual fields intact. PERRL.  III,IV, VI: No ptosis. EOMI. No nystagmus.  V,VII: No facial droop. Temp sensation equal bilaterally  VIII: hearing intact to voice.  IX,X: Palate rises symmetrically XI: Symmetric XII: midline tongue extension  Motor: Right :  Upper extremity   5/5                                      Left:     Upper extremity   5/5             Lower extremity   5/5                                                  Lower extremity   5/5 Sensory: Temp and light touch intact x 4 Deep Tendon Reflexes:  2+ bilateral upper and lower extremities.  Plantars: Right: downgoing                           Left: downgoing Cerebellar: No ataxia with FNF bilaterally  Gait: Deferred   ASSESSMENT/PLAN Ms. Panagiota Perfetti is a 59 y.o. female presenting from [facility] s/p IVtPA with acute stroke-like symptoms of dizziness, diplopia, vomiting and  imbalance.   Stroke wk up underway  CT head No acute abnormality.    CTA head & neck No LVO or stensois  MRI  pending  2D Echo pending  LDL 138  HgbA1c 6.0  no VTE prophylaxis till 24h post tPA    Diet   Diet vegetarian Room service appropriate? Yes; Fluid consistency: Thin     none prior to admission, now still less than 24h post tPA  Therapy recommendations:  pending  Disposition:  pending  No dx of Hypertension . Long-term BP goal normotensive  Hyperlipidemia  Home meds:none  LDL 138, goal < 70  Add Lipitor 40mg  PO daily  Continue statin at discharge  No dx of Diabetes    HgbA1c 6.0, goal < 7.0  Other Stroke Risk Factors  none   Hospital day # 0  Desiree Metzger-Cihelka, ARNP-C, ANVP-BC Pager: [phone]  I have personally obtained  history,examined this patient, reviewed notes, independently viewed imaging studies, participated in medical decision making and plan of care.ROS completed by me personally and pertinent positives fully documented  I have made any additions or clarifications directly to the above note. Agree with note above.  She presented with sudden onset of vertigo, nausea vomiting, dizziness and gait ataxia likely due to posterior circulation TIA versus small infarct and received IV TPA with complete resolution of her symptoms.  Recommend continue close neurological monitoring and strict blood pressure control as per post TPA protocol.  Check MRI scan of the brain later today, echocardiogram, mobilize out of bed and therapy consults.  Add statin for elevated lipids and may need starting oral blood pressure medications and discharge.  Long discussion patient and husband at the bedside and answered questions.This patient is critically ill and at significant risk of neurological worsening, death and care requires constant monitoring of vital signs, hemodynamics,respiratory and cardiac monitoring, extensive review of multiple databases, frequent neurological assessment, discussion with family, other specialists and medical decision making of high complexity.I have made any additions or clarifications directly to the above note.This critical care time does not reflect procedure time, or teaching time or supervisory time of PA/NP/Med Resident etc but could involve care discussion time.  I spent 30 minutes of neurocritical care time  in the care of  this patient.      [phone], MD Medical Director [facility] Pager: [phone] 01/30/2020 2:20 PM  To contact Stroke Continuity provider, please refer to 02/01/2020. After hours, contact General Neurology

## 2020-01-30 NOTE — Progress Notes (Signed)
  Echocardiogram 2D Echocardiogram has been performed.  Garison Genova A Aubrynn Katona 01/30/2020, 10:35 AM

## 2020-01-30 NOTE — Progress Notes (Signed)
PT Cancellation Note  Patient Details Name: Judy Gutierrez MRN: [id number] DOB: 05-13-1961   Cancelled Treatment:    Reason Eval/Treat Not Completed: Patient not medically ready.  Strict bedrest post tPA and going to MRI imminently. 01/30/2020  Judy Gutierrez., PT Acute Rehabilitation Services [phone]  (pager) [phone]  (office)   Judy Gutierrez 01/30/2020, 1:51 PM

## 2020-01-30 NOTE — H&P (Signed)
Admission H&P    Chief Complaint: Dizziness, imbalance, diplopia and vomiting.  HPI: Judy Gutierrez is an 59 y.o. female presenting in transfer from [facility] after receiving tPA there for a possible stroke. Her presenting symptoms were dizziness, imbalance, diplopia and vomiting. CT head at [facility] was negative. She was evaluated by Teleneurology and IV tPA was administered. CTA was then obtained, revealing no LVO. She was then transferred to [facility] for post-tPA management as an admission to the ICU under the Neurology service.   The Teleneurologist's note has been reviewed: "59yo woman w/no significant PMH p/w dizziness, imbalance, diplopia, vomiting on 01/29/20. She was eating pasta when she suddenly developed room-spinning dizziness and then vomited 3 times. She had unsteady gait on the way to the bathroom. She also briefly saw 3-4 of the same object but that has resolved. She denies weakness, numbness, speech changes, headache, chest pain, hearing loss, tinnitus. She currently feels nausea and lightheadedness, and continues to dry heave. NIHSS 0, neuro exam shows unsteady gait/gait ataxia. CTH has no acute findings. The patient's unsteady gait is new, acute, mild but disabling. Considering the possible disabling effect of this deficit on daily living, the benefits of alteplase outweigh the risks in this setting of a low NIHSS in accordance with AHA stroke guidelines. NIHSS is also known to underestimate posterior circulation strokes which this presentation is consistent with. After no contraindications were identified, I discussed the indication, side effects and risk/benefit of alteplase compared to antiplatelet therapy with the patient and her family who accepted treatment. CTA Head/Neck will be followed up to rule out large vessel occlusion, specifically in the basilar. Presentation is concerning for acute brainstem ischemic stroke based on history and exam, stroke workup with post alteplase protocol  advised."  Past Medical History:  Diagnosis Date  . Disturbance of skin sensation   . Myalgia and myositis, unspecified   . Other B-complex deficiencies   . 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 Unspecified essential hypertension     Past Surgical History:  Procedure Laterality Date  . CESAREAN SECTION  1992    Family History  Problem Relation Age of Onset  . Pulmonary embolism Mother   . Hepatitis Father   . Diabetes Mellitus II Sister   . Breast cancer Neg Hx    Social History:  reports that she has never smoked. She has never used smokeless tobacco. She reports that she does not drink alcohol or use drugs.  Allergies: No Known Allergies  Medications Prior to Admission  Medication Sig Dispense Refill  . calcium carbonate (OS-CAL) 600 MG TABS Take 600 mg by mouth daily.    . Multiple Vitamins-Minerals (MULTIVITAMIN WITH MINERALS) tablet Take 1 tablet by mouth daily.      ROS: As per HPI. Comprehensive ROS otherwise negative.   Physical Examination: Blood pressure (!) 164/80, pulse 61, temperature 98.2 F (36.8 C), resp. rate 20, height 5' 1.5" (1.562 m), weight 63.5 kg, last menstrual period 12/08/2010, SpO2 97 %.  HEENT-  Wilber/AT  Lungs - Respirations unlabored Abdomen - Nondistended Extremities - No edema  Neurologic Examination: Mental Status:  Alert, fully oriented, thought content appropriate.  Speech fluent without evidence of aphasia.  Able to follow all commands without difficulty. Cranial Nerves: II:  Visual fields intact with no extinction to DSS. PERRL.  III,IV, VI: No ptosis. EOMI. No nystagmus.  V,VII: No facial droop. Temp sensation equal bilaterally  VIII: hearing intact to voice.  IX,X: Palate rises symmetrically XI:  Symmetric XII: midline tongue extension  Motor: Right : Upper extremity   5/5    Left:     Upper extremity   5/5  Lower extremity   5/5     Lower extremity   5/5 Sensory: Temp and light touch intact x 4 Deep Tendon  Reflexes:  2+ bilateral upper and lower extremities.  Plantars: Right: downgoing   Left: downgoing Cerebellar: No ataxia with FNF bilaterally  Gait: Deferred   Results for orders placed or performed during the hospital encounter of 01/29/20 (from the past 48 hour(s))  CBC with Differential     Status: Abnormal   Collection Time: 01/29/20 10:04 PM  Result Value Ref Range   WBC 9.1 4.0 - 10.5 K/uL   RBC 4.50 3.87 - 5.11 MIL/uL   Hemoglobin 11.9 (L) 12.0 - 15.0 g/dL   HCT 37.8 36.0 - 46.0 %   MCV 84.0 80.0 - 100.0 fL   MCH 26.4 26.0 - 34.0 pg   MCHC 31.5 30.0 - 36.0 g/dL   RDW 13.2 11.5 - 15.5 %   Platelets 277 150 - 400 K/uL   nRBC 0.0 0.0 - 0.2 %   Neutrophils Relative % 63 %   Neutro Abs 5.7 1.7 - 7.7 K/uL   Lymphocytes Relative 30 %   Lymphs Abs 2.7 0.7 - 4.0 K/uL   Monocytes Relative 4 %   Monocytes Absolute 0.4 0.1 - 1.0 K/uL   Eosinophils Relative 2 %   Eosinophils Absolute 0.2 0.0 - 0.5 K/uL   Basophils Relative 0 %   Basophils Absolute 0.0 0.0 - 0.1 K/uL   Immature Granulocytes 1 %   Abs Immature Granulocytes 0.07 0.00 - 0.07 K/uL    Comment: Performed at [facility], [address] [place], [place] [address]  Protime-INR     Status: None   Collection Time: 01/29/20 10:04 PM  Result Value Ref Range   Prothrombin Time 13.2 11.4 - 15.2 seconds   INR 1.0 0.8 - 1.2    Comment: (NOTE) INR goal varies based on device and disease states. Performed at [facility], [address] [place], [place] [address]   APTT     Status: None   Collection Time: 01/29/20 10:04 PM  Result Value Ref Range   aPTT 28 24 - 36 seconds    Comment: Performed at [facility], [address] [place], [place] [address]  Ethanol     Status: None   Collection Time: 01/29/20 10:07 PM  Result Value Ref Range   Alcohol, Ethyl (B) <10 <10 mg/dL    Comment:        LOWEST DETECTABLE LIMIT FOR SERUM ALCOHOL IS 10 mg/dL FOR MEDICAL PURPOSES ONLY Performed at  [facility], [address] [place], [place] [address]   Comprehensive metabolic panel     Status: Abnormal   Collection Time: 01/29/20 10:34 PM  Result Value Ref Range   Sodium 138 135 - 145 mmol/L   Potassium 3.1 (L) 3.5 - 5.1 mmol/L   Chloride 103 98 - 111 mmol/L   CO2 24 22 - 32 mmol/L   Glucose, Bld 153 (H) 70 - 99 mg/dL    Comment: Glucose reference range applies only to samples taken after fasting for at least 8 hours.   BUN 16 6 - 20 mg/dL   Creatinine, Ser 0.48 0.44 - 1.00 mg/dL   Calcium 8.6 (L) 8.9 - 10.3 mg/dL  Total Protein 8.1 6.5 - 8.1 g/dL   Albumin 4.0 3.5 - 5.0 g/dL   AST 42 (H) 15 - 41 U/L   ALT 29 0 - 44 U/L   Alkaline Phosphatase 112 38 - 126 U/L   Total Bilirubin 0.6 0.3 - 1.2 mg/dL   GFR calc non Af Amer >60 >60 mL/min   GFR calc Af Amer >60 >60 mL/min   Anion gap 11 5 - 15    Comment: Performed at [facility], [address] [place], [place] [address]  Urine rapid drug screen (hosp performed)     Status: None   Collection Time: 01/29/20 11:58 PM  Result Value Ref Range   Opiates NONE DETECTED NONE DETECTED   Cocaine NONE DETECTED NONE DETECTED   Benzodiazepines NONE DETECTED NONE DETECTED   Amphetamines NONE DETECTED NONE DETECTED   Tetrahydrocannabinol NONE DETECTED NONE DETECTED   Barbiturates NONE DETECTED NONE DETECTED    Comment: (NOTE) DRUG SCREEN FOR MEDICAL PURPOSES ONLY.  IF CONFIRMATION IS NEEDED FOR ANY PURPOSE, NOTIFY LAB WITHIN 5 DAYS. LOWEST DETECTABLE LIMITS FOR URINE DRUG SCREEN Drug Class                     Cutoff (ng/mL) Amphetamine and metabolites    1000 Barbiturate and metabolites    200 Benzodiazepine                 270 Tricyclics and metabolites     300 Opiates and metabolites        300 Cocaine and metabolites        300 THC                            50 Performed at [facility], [address] [place], [place] [address]   Urinalysis, Routine w reflex microscopic     Status:  Abnormal   Collection Time: 01/29/20 11:58 PM  Result Value Ref Range   Color, Urine YELLOW YELLOW   APPearance CLEAR CLEAR   Specific Gravity, Urine 1.010 1.005 - 1.030   pH 7.5 5.0 - 8.0   Glucose, UA 100 (A) NEGATIVE mg/dL   Hgb urine dipstick NEGATIVE NEGATIVE   Bilirubin Urine NEGATIVE NEGATIVE   Ketones, ur 15 (A) NEGATIVE mg/dL   Protein, ur NEGATIVE NEGATIVE mg/dL   Nitrite NEGATIVE NEGATIVE   Leukocytes,Ua SMALL (A) NEGATIVE    Comment: Performed at [facility], [address] [place], [place] [address]  Pregnancy, urine     Status: None   Collection Time: 01/29/20 11:58 PM  Result Value Ref Range   Preg Test, Ur NEGATIVE NEGATIVE    Comment:        THE SENSITIVITY OF THIS METHODOLOGY IS >20 mIU/mL. Performed at [facility], [address] [place], [place] [address]   Urinalysis, Microscopic (reflex)     Status: Abnormal   Collection Time: 01/29/20 11:58 PM  Result Value Ref Range   RBC / HPF 0-5 0 - 5 RBC/hpf   WBC, UA 6-10 0 - 5 WBC/hpf   Bacteria, UA RARE (A) NONE SEEN   Squamous Epithelial / LPF 0-5 0 - 5    Comment: Performed at [facility], [address] [place], [place] [address]  SARS Coronavirus 2 Ag (30 min TAT) - Nasal Swab (BD Veritor Kit)     Status: None  Collection Time: 01/30/20 12:24 AM   Specimen: Nasal Swab (BD Veritor Kit)  Result Value Ref Range   SARS Coronavirus 2 Ag NEGATIVE NEGATIVE    Comment: (NOTE) SARS-CoV-2 antigen NOT DETECTED.  Negative results are presumptive.  Negative results do not preclude SARS-CoV-2 infection and should not be used as the sole basis for treatment or other patient management decisions, including infection  control decisions, particularly in the presence of clinical signs and  symptoms consistent with COVID-19, or in those who have been in contact with the virus.  Negative results must be combined with clinical observations, patient history, and  epidemiological information. The expected result is Negative. Fact Sheet for Patients: PodPark.tn Fact Sheet for Healthcare Providers: GiftContent.is This test is not yet approved or cleared by the [place] FDA and  has been authorized for detection and/or diagnosis of SARS-CoV-2 by FDA under an Emergency Use Authorization (EUA).  This EUA will remain in effect (meaning this test can be used) for the duration of  the COVID-19 de claration under Section 564(b)(1) of the Act, 21 U.S.C. section 360bbb-3(b)(1), unless the authorization is terminated or revoked sooner. Performed at [facility], [address] [place], [place] [address]   Respiratory Panel by RT PCR (Flu A&B, Covid) - Nasopharyngeal Swab     Status: None   Collection Time: 01/30/20 12:25 AM   Specimen: Nasopharyngeal Swab  Result Value Ref Range   SARS Coronavirus 2 by RT PCR NEGATIVE NEGATIVE    Comment: (NOTE) SARS-CoV-2 target nucleic acids are NOT DETECTED. The SARS-CoV-2 RNA is generally detectable in upper respiratoy specimens during the acute phase of infection. The lowest concentration of SARS-CoV-2 viral copies this assay can detect is 131 copies/mL. A negative result does not preclude SARS-Cov-2 infection and should not be used as the sole basis for treatment or other patient management decisions. A negative result may occur with  improper specimen collection/handling, submission of specimen other than nasopharyngeal swab, presence of viral mutation(s) within the areas targeted by this assay, and inadequate number of viral copies (<131 copies/mL). A negative result must be combined with clinical observations, patient history, and epidemiological information. The expected result is Negative. Fact Sheet for Patients:  PinkCheek.be Fact Sheet for Healthcare Providers:   GravelBags.it This test is not yet ap proved or cleared by the [place] FDA and  has been authorized for detection and/or diagnosis of SARS-CoV-2 by FDA under an Emergency Use Authorization (EUA). This EUA will remain  in effect (meaning this test can be used) for the duration of the COVID-19 declaration under Section 564(b)(1) of the Act, 21 U.S.C. section 360bbb-3(b)(1), unless the authorization is terminated or revoked sooner.    Influenza A by PCR NEGATIVE NEGATIVE   Influenza B by PCR NEGATIVE NEGATIVE    Comment: (NOTE) The Xpert Xpress SARS-CoV-2/FLU/RSV assay is intended as an aid in  the diagnosis of influenza from Nasopharyngeal swab specimens and  should not be used as a sole basis for treatment. Nasal washings and  aspirates are unacceptable for Xpert Xpress SARS-CoV-2/FLU/RSV  testing. Fact Sheet for Patients: PinkCheek.be Fact Sheet for Healthcare Providers: GravelBags.it This test is not yet approved or cleared by the [place] FDA and  has been authorized for detection and/or diagnosis of SARS-CoV-2 by  FDA under an Emergency Use Authorization (EUA). This EUA will remain  in effect (meaning this test can be used) for the duration of the  Covid-19 declaration under Section 564(b)(1) of the Act,  21  U.S.C. section 360bbb-3(b)(1), unless the authorization is  terminated or revoked. Performed at [facility], [address] [place], [place] [address]    CT Code Stroke CTA Head W/WO contrast  Result Date: 01/30/2020 CLINICAL DATA:  Dizziness and nausea EXAM: CT ANGIOGRAPHY HEAD AND NECK TECHNIQUE: Multidetector CT imaging of the head and neck was performed using the standard protocol during bolus administration of intravenous contrast. Multiplanar CT image reconstructions and MIPs were obtained to evaluate the vascular anatomy. Carotid stenosis measurements (when  applicable) are obtained utilizing NASCET criteria, using the distal internal carotid diameter as the denominator. CONTRAST:  153m OMNIPAQUE IOHEXOL 350 MG/ML SOLN COMPARISON:  None. FINDINGS: CT HEAD FINDINGS Brain: There is no mass, hemorrhage or extra-axial collection. The size and configuration of the ventricles and extra-axial CSF spaces are normal. There is no acute or chronic infarction. The brain parenchyma is normal. Skull: The visualized skull base, calvarium and extracranial soft tissues are normal. Sinuses/Orbits: No fluid levels or advanced mucosal thickening of the visualized paranasal sinuses. No mastoid or middle ear effusion. The orbits are normal. CTA NECK FINDINGS SKELETON: There is no bony spinal canal stenosis. No lytic or blastic lesion. OTHER NECK: Normal pharynx, larynx and major salivary glands. No cervical lymphadenopathy. Unremarkable thyroid gland. UPPER CHEST: No pneumothorax or pleural effusion. No nodules or masses. AORTIC ARCH: There is no calcific atherosclerosis of the aortic arch. There is no aneurysm, dissection or hemodynamically significant stenosis of the visualized portion of the aorta. Normal variant aortic arch branching pattern with the left vertebral artery arising independently from the aortic arch. The visualized proximal subclavian arteries are widely patent. RIGHT CAROTID SYSTEM: Normal without aneurysm, dissection or stenosis. LEFT CAROTID SYSTEM: Normal without aneurysm, dissection or stenosis. VERTEBRAL ARTERIES: Left dominant configuration. Both origins are clearly patent. There is no dissection, occlusion or flow-limiting stenosis to the skull base (V1-V3 segments). CTA HEAD FINDINGS POSTERIOR CIRCULATION: --Vertebral arteries: Normal V4 segments. --Posterior inferior cerebellar arteries (PICA): Patent origins from the vertebral arteries. --Anterior inferior cerebellar arteries (AICA): Patent origins from the basilar artery. --Basilar artery: Normal. --Superior  cerebellar arteries: Normal. --Posterior cerebral arteries: Normal. Both are predominantly supplied by the posterior communicating arteries (p-comm). ANTERIOR CIRCULATION: --Intracranial internal carotid arteries: Normal. --Anterior cerebral arteries (ACA): Normal. Both A1 segments are present. Patent anterior communicating artery (a-comm). --Middle cerebral arteries (MCA): Normal. VENOUS SINUSES: As permitted by contrast timing, patent. ANATOMIC VARIANTS: Fetal origins of both posterior cerebral arteries. Review of the MIP images confirms the above findings. IMPRESSION: Normal CTA of the head and neck. Electronically Signed   By: KUlyses JarredM.D.   On: 01/30/2020 00:09   CT Code Stroke CTA Neck W/WO contrast  Result Date: 01/30/2020 CLINICAL DATA:  Dizziness and nausea EXAM: CT ANGIOGRAPHY HEAD AND NECK TECHNIQUE: Multidetector CT imaging of the head and neck was performed using the standard protocol during bolus administration of intravenous contrast. Multiplanar CT image reconstructions and MIPs were obtained to evaluate the vascular anatomy. Carotid stenosis measurements (when applicable) are obtained utilizing NASCET criteria, using the distal internal carotid diameter as the denominator. CONTRAST:  1016mOMNIPAQUE IOHEXOL 350 MG/ML SOLN COMPARISON:  None. FINDINGS: CT HEAD FINDINGS Brain: There is no mass, hemorrhage or extra-axial collection. The size and configuration of the ventricles and extra-axial CSF spaces are normal. There is no acute or chronic infarction. The brain parenchyma is normal. Skull: The visualized skull base, calvarium and extracranial soft tissues are normal. Sinuses/Orbits: No fluid levels or  advanced mucosal thickening of the visualized paranasal sinuses. No mastoid or middle ear effusion. The orbits are normal. CTA NECK FINDINGS SKELETON: There is no bony spinal canal stenosis. No lytic or blastic lesion. OTHER NECK: Normal pharynx, larynx and major salivary glands. No cervical  lymphadenopathy. Unremarkable thyroid gland. UPPER CHEST: No pneumothorax or pleural effusion. No nodules or masses. AORTIC ARCH: There is no calcific atherosclerosis of the aortic arch. There is no aneurysm, dissection or hemodynamically significant stenosis of the visualized portion of the aorta. Normal variant aortic arch branching pattern with the left vertebral artery arising independently from the aortic arch. The visualized proximal subclavian arteries are widely patent. RIGHT CAROTID SYSTEM: Normal without aneurysm, dissection or stenosis. LEFT CAROTID SYSTEM: Normal without aneurysm, dissection or stenosis. VERTEBRAL ARTERIES: Left dominant configuration. Both origins are clearly patent. There is no dissection, occlusion or flow-limiting stenosis to the skull base (V1-V3 segments). CTA HEAD FINDINGS POSTERIOR CIRCULATION: --Vertebral arteries: Normal V4 segments. --Posterior inferior cerebellar arteries (PICA): Patent origins from the vertebral arteries. --Anterior inferior cerebellar arteries (AICA): Patent origins from the basilar artery. --Basilar artery: Normal. --Superior cerebellar arteries: Normal. --Posterior cerebral arteries: Normal. Both are predominantly supplied by the posterior communicating arteries (p-comm). ANTERIOR CIRCULATION: --Intracranial internal carotid arteries: Normal. --Anterior cerebral arteries (ACA): Normal. Both A1 segments are present. Patent anterior communicating artery (a-comm). --Middle cerebral arteries (MCA): Normal. VENOUS SINUSES: As permitted by contrast timing, patent. ANATOMIC VARIANTS: Fetal origins of both posterior cerebral arteries. Review of the MIP images confirms the above findings. IMPRESSION: Normal CTA of the head and neck. Electronically Signed   By: Ulyses Jarred M.D.   On: 01/30/2020 00:09   CT HEAD CODE STROKE WO CONTRAST  Result Date: 01/29/2020 CLINICAL DATA:  Code stroke.  Nausea and dizziness EXAM: CT HEAD WITHOUT CONTRAST TECHNIQUE:  Contiguous axial images were obtained from the base of the skull through the vertex without intravenous contrast. COMPARISON:  None. FINDINGS: Brain: There is no mass, hemorrhage or extra-axial collection. The size and configuration of the ventricles and extra-axial CSF spaces are normal. The brain parenchyma is normal, without evidence of acute or chronic infarction. Vascular: No abnormal hyperdensity of the major intracranial arteries or dural venous sinuses. No intracranial atherosclerosis. Skull: The visualized skull base, calvarium and extracranial soft tissues are normal. Small right frontal osteoma. Sinuses/Orbits: No fluid levels or advanced mucosal thickening of the visualized paranasal sinuses. No mastoid or middle ear effusion. The orbits are normal. ASPECTS [facility] Stroke Program Early CT Score) - Ganglionic level infarction (caudate, lentiform nuclei, internal capsule, insula, M1-M3 cortex): 7 - Supraganglionic infarction (M4-M6 cortex): 3 Total score (0-10 with 10 being normal): 10 IMPRESSION: 1. Normal head CT 2. ASPECTS is 10. 3. These results were called by telephone at the time of interpretation on 01/29/2020 at 10:24 pm to provider DAN FLOYD , who verbally acknowledged these results. Electronically Signed   By: Ulyses Jarred M.D.   On: 01/29/2020 22:24     Assessment: 59 year old female with stroke symptoms, s/p tPA at OSH 1. Exam after arrival to [facility] reveals no neurological deficits.  2. CT head at OSH was negative.  3. CTA of head and neck at OSH was normal.   Plan: 1. Has been admitted to the ICU under the Neurology service  2. Post-tPA order set to include frequent neuro checks and BP management.  3. No antiplatelet medications or anticoagulants for at least 24 hours following tPA.  4. DVT prophylaxis with SCDs.  5. May  need to be started on a statin.  6. Will need to be started on antiplatelet therapy if follow up CT at 24 hours is negative for hemorrhagic conversion. 7.  Fasting lipid panel, HgbA1c 8. TTE.  9. MRI brain  10. PT/OT/Speech.  11. NPO until passes swallow evaluation.  12. Telemetry monitoring   Electronically signed: Dr. Kerney Elbe 01/30/2020, 2:54 AM

## 2020-01-30 NOTE — ED Provider Notes (Signed)
Nursing notes and vitals signs, including pulse oximetry, reviewed.  Summary of this visit's results, reviewed by myself:  EKG:  EKG Interpretation  Date/Time:    Ventricular Rate:    PR Interval:    QRS Duration:   QT Interval:    QTC Calculation:   R Axis:     Text Interpretation:         Labs:  Results for orders placed or performed during the hospital encounter of 01/29/20 (from the past 24 hour(s))  CBC with Differential     Status: Abnormal   Collection Time: 01/29/20 10:04 PM  Result Value Ref Range   WBC 9.1 4.0 - 10.5 K/uL   RBC 4.50 3.87 - 5.11 MIL/uL   Hemoglobin 11.9 (L) 12.0 - 15.0 g/dL   HCT 37.8 36.0 - 46.0 %   MCV 84.0 80.0 - 100.0 fL   MCH 26.4 26.0 - 34.0 pg   MCHC 31.5 30.0 - 36.0 g/dL   RDW 13.2 11.5 - 15.5 %   Platelets 277 150 - 400 K/uL   nRBC 0.0 0.0 - 0.2 %   Neutrophils Relative % 63 %   Neutro Abs 5.7 1.7 - 7.7 K/uL   Lymphocytes Relative 30 %   Lymphs Abs 2.7 0.7 - 4.0 K/uL   Monocytes Relative 4 %   Monocytes Absolute 0.4 0.1 - 1.0 K/uL   Eosinophils Relative 2 %   Eosinophils Absolute 0.2 0.0 - 0.5 K/uL   Basophils Relative 0 %   Basophils Absolute 0.0 0.0 - 0.1 K/uL   Immature Granulocytes 1 %   Abs Immature Granulocytes 0.07 0.00 - 0.07 K/uL  Protime-INR     Status: None   Collection Time: 01/29/20 10:04 PM  Result Value Ref Range   Prothrombin Time 13.2 11.4 - 15.2 seconds   INR 1.0 0.8 - 1.2  APTT     Status: None   Collection Time: 01/29/20 10:04 PM  Result Value Ref Range   aPTT 28 24 - 36 seconds  Ethanol     Status: None   Collection Time: 01/29/20 10:07 PM  Result Value Ref Range   Alcohol, Ethyl (B) <10 <10 mg/dL  Comprehensive metabolic panel     Status: Abnormal   Collection Time: 01/29/20 10:34 PM  Result Value Ref Range   Sodium 138 135 - 145 mmol/L   Potassium 3.1 (L) 3.5 - 5.1 mmol/L   Chloride 103 98 - 111 mmol/L   CO2 24 22 - 32 mmol/L   Glucose, Bld 153 (H) 70 - 99 mg/dL   BUN 16 6 - 20 mg/dL    Creatinine, Ser 0.48 0.44 - 1.00 mg/dL   Calcium 8.6 (L) 8.9 - 10.3 mg/dL   Total Protein 8.1 6.5 - 8.1 g/dL   Albumin 4.0 3.5 - 5.0 g/dL   AST 42 (H) 15 - 41 U/L   ALT 29 0 - 44 U/L   Alkaline Phosphatase 112 38 - 126 U/L   Total Bilirubin 0.6 0.3 - 1.2 mg/dL   GFR calc non Af Amer >60 >60 mL/min   GFR calc Af Amer >60 >60 mL/min   Anion gap 11 5 - 15  Pregnancy, urine     Status: None   Collection Time: 01/29/20 11:58 PM  Result Value Ref Range   Preg Test, Ur NEGATIVE NEGATIVE    Imaging Studies: CT Code Stroke CTA Head W/WO contrast  Result Date: 01/30/2020 CLINICAL DATA:  Dizziness and nausea EXAM: CT ANGIOGRAPHY HEAD AND NECK  TECHNIQUE: Multidetector CT imaging of the head and neck was performed using the standard protocol during bolus administration of intravenous contrast. Multiplanar CT image reconstructions and MIPs were obtained to evaluate the vascular anatomy. Carotid stenosis measurements (when applicable) are obtained utilizing NASCET criteria, using the distal internal carotid diameter as the denominator. CONTRAST:  OMNIPAQUE IOHEXOL 350 MG/ML SOLN COMPARISON:  None. FINDINGS: CT HEAD FINDINGS Brain: There is no mass, hemorrhage or extra-axial collection. The size and configuration of the ventricles and extra-axial CSF spaces are normal. There is no acute or chronic infarction. The brain parenchyma is normal. Skull: The visualized skull base, calvarium and extracranial soft tissues are normal. Sinuses/Orbits: No fluid levels or advanced mucosal thickening of the visualized paranasal sinuses. No mastoid or middle ear effusion. The orbits are normal. CTA NECK FINDINGS SKELETON: There is no bony spinal canal stenosis. No lytic or blastic lesion. OTHER NECK: Normal pharynx, larynx and major salivary glands. No cervical lymphadenopathy. Unremarkable thyroid gland. UPPER CHEST: No pneumothorax or pleural effusion. No nodules or masses. AORTIC ARCH: There is no calcific  atherosclerosis of the aortic arch. There is no aneurysm, dissection or hemodynamically significant stenosis of the visualized portion of the aorta. Normal variant aortic arch branching pattern with the left vertebral artery arising independently from the aortic arch. The visualized proximal subclavian arteries are widely patent. RIGHT CAROTID SYSTEM: Normal without aneurysm, dissection or stenosis. LEFT CAROTID SYSTEM: Normal without aneurysm, dissection or stenosis. VERTEBRAL ARTERIES: Left dominant configuration. Both origins are clearly patent. There is no dissection, occlusion or flow-limiting stenosis to the skull base (V1-V3 segments). CTA HEAD FINDINGS POSTERIOR CIRCULATION: --Vertebral arteries: Normal V4 segments. --Posterior inferior cerebellar arteries (PICA): Patent origins from the vertebral arteries. --Anterior inferior cerebellar arteries (AICA): Patent origins from the basilar artery. --Basilar artery: Normal. --Superior cerebellar arteries: Normal. --Posterior cerebral arteries: Normal. Both are predominantly supplied by the posterior communicating arteries (p-comm). ANTERIOR CIRCULATION: --Intracranial internal carotid arteries: Normal. --Anterior cerebral arteries (ACA): Normal. Both A1 segments are present. Patent anterior communicating artery (a-comm). --Middle cerebral arteries (MCA): Normal. VENOUS SINUSES: As permitted by contrast timing, patent. ANATOMIC VARIANTS: Fetal origins of both posterior cerebral arteries. Review of the MIP images confirms the above findings. IMPRESSION: Normal CTA of the head and neck. Electronically Signed   By: Deatra Robinson M.D.   On: 01/30/2020 00:09   CT Code Stroke CTA Neck W/WO contrast  Result Date: 01/30/2020 CLINICAL DATA:  Dizziness and nausea EXAM: CT ANGIOGRAPHY HEAD AND NECK TECHNIQUE: Multidetector CT imaging of the head and neck was performed using the standard protocol during bolus administration of intravenous contrast. Multiplanar CT image  reconstructions and MIPs were obtained to evaluate the vascular anatomy. Carotid stenosis measurements (when applicable) are obtained utilizing NASCET criteria, using the distal internal carotid diameter as the denominator. CONTRAST:  OMNIPAQUE IOHEXOL 350 MG/ML SOLN COMPARISON:  None. FINDINGS: CT HEAD FINDINGS Brain: There is no mass, hemorrhage or extra-axial collection. The size and configuration of the ventricles and extra-axial CSF spaces are normal. There is no acute or chronic infarction. The brain parenchyma is normal. Skull: The visualized skull base, calvarium and extracranial soft tissues are normal. Sinuses/Orbits: No fluid levels or advanced mucosal thickening of the visualized paranasal sinuses. No mastoid or middle ear effusion. The orbits are normal. CTA NECK FINDINGS SKELETON: There is no bony spinal canal stenosis. No lytic or blastic lesion. OTHER NECK: Normal pharynx, larynx and major salivary glands. No cervical lymphadenopathy. Unremarkable thyroid gland. UPPER CHEST: No pneumothorax  or pleural effusion. No nodules or masses. AORTIC ARCH: There is no calcific atherosclerosis of the aortic arch. There is no aneurysm, dissection or hemodynamically significant stenosis of the visualized portion of the aorta. Normal variant aortic arch branching pattern with the left vertebral artery arising independently from the aortic arch. The visualized proximal subclavian arteries are widely patent. RIGHT CAROTID SYSTEM: Normal without aneurysm, dissection or stenosis. LEFT CAROTID SYSTEM: Normal without aneurysm, dissection or stenosis. VERTEBRAL ARTERIES: Left dominant configuration. Both origins are clearly patent. There is no dissection, occlusion or flow-limiting stenosis to the skull base (V1-V3 segments). CTA HEAD FINDINGS POSTERIOR CIRCULATION: --Vertebral arteries: Normal V4 segments. --Posterior inferior cerebellar arteries (PICA): Patent origins from the vertebral arteries. --Anterior  inferior cerebellar arteries (AICA): Patent origins from the basilar artery. --Basilar artery: Normal. --Superior cerebellar arteries: Normal. --Posterior cerebral arteries: Normal. Both are predominantly supplied by the posterior communicating arteries (p-comm). ANTERIOR CIRCULATION: --Intracranial internal carotid arteries: Normal. --Anterior cerebral arteries (ACA): Normal. Both A1 segments are present. Patent anterior communicating artery (a-comm). --Middle cerebral arteries (MCA): Normal. VENOUS SINUSES: As permitted by contrast timing, patent. ANATOMIC VARIANTS: Fetal origins of both posterior cerebral arteries. Review of the MIP images confirms the above findings. IMPRESSION: Normal CTA of the head and neck. Electronically Signed   By: Deatra Robinson M.D.   On: 01/30/2020 00:09   CT HEAD CODE STROKE WO CONTRAST  Result Date: 01/29/2020 CLINICAL DATA:  Code stroke.  Nausea and dizziness EXAM: CT HEAD WITHOUT CONTRAST TECHNIQUE: Contiguous axial images were obtained from the base of the skull through the vertex without intravenous contrast. COMPARISON:  None. FINDINGS: Brain: There is no mass, hemorrhage or extra-axial collection. The size and configuration of the ventricles and extra-axial CSF spaces are normal. The brain parenchyma is normal, without evidence of acute or chronic infarction. Vascular: No abnormal hyperdensity of the major intracranial arteries or dural venous sinuses. No intracranial atherosclerosis. Skull: The visualized skull base, calvarium and extracranial soft tissues are normal. Small right frontal osteoma. Sinuses/Orbits: No fluid levels or advanced mucosal thickening of the visualized paranasal sinuses. No mastoid or middle ear effusion. The orbits are normal. ASPECTS [facility] Stroke Program Early CT Score) - Ganglionic level infarction (caudate, lentiform nuclei, internal capsule, insula, M1-M3 cortex): 7 - Supraganglionic infarction (M4-M6 cortex): 3 Total score (0-10 with 10  being normal): 10 IMPRESSION: 1. Normal head CT 2. ASPECTS is 10. 3. These results were called by telephone at the time of interpretation on 01/29/2020 at 10:24 pm to provider DAN FLOYD , who verbally acknowledged these results. Electronically Signed   By: Deatra Robinson M.D.   On: 01/29/2020 22:24   12:27 AM CTA shows no occlusions.  Discussed with Dr. Otelia Limes of neurology who will admit the patient to the neuro ICU for post TPA care.   Acelyn Basham, Jonny Ruiz, MD 01/30/20 [facility] Shoe

## 2020-01-31 ENCOUNTER — Inpatient Hospital Stay (HOSPITAL_COMMUNITY): Payer: No Typology Code available for payment source

## 2020-01-31 DIAGNOSIS — I639 Cerebral infarction, unspecified: Secondary | ICD-10-CM | POA: Diagnosis not present

## 2020-01-31 DIAGNOSIS — I6350 Cerebral infarction due to unspecified occlusion or stenosis of unspecified cerebral artery: Secondary | ICD-10-CM

## 2020-01-31 LAB — SEDIMENTATION RATE: Sed Rate: 33 mm/hr — ABNORMAL HIGH (ref 0–22)

## 2020-01-31 MED ORDER — AMLODIPINE BESYLATE 5 MG PO TABS
5.0000 mg | ORAL_TABLET | Freq: Every day | ORAL | Status: DC
  Administered 2020-01-31 – 2020-02-01 (×2): 5 mg via ORAL
  Filled 2020-01-31 (×3): qty 1

## 2020-01-31 MED ORDER — PANTOPRAZOLE SODIUM 40 MG PO TBEC
40.0000 mg | DELAYED_RELEASE_TABLET | Freq: Every day | ORAL | Status: DC
  Administered 2020-01-31: 40 mg via ORAL
  Filled 2020-01-31: qty 1

## 2020-01-31 MED ORDER — ASPIRIN EC 81 MG PO TBEC
81.0000 mg | DELAYED_RELEASE_TABLET | Freq: Every day | ORAL | Status: DC
  Administered 2020-01-31 – 2020-02-01 (×2): 81 mg via ORAL
  Filled 2020-01-31 (×2): qty 1

## 2020-01-31 MED ORDER — CLOPIDOGREL BISULFATE 75 MG PO TABS
75.0000 mg | ORAL_TABLET | Freq: Every day | ORAL | Status: DC
  Administered 2020-01-31 – 2020-02-01 (×2): 75 mg via ORAL
  Filled 2020-01-31 (×2): qty 1

## 2020-01-31 MED ORDER — ONDANSETRON HCL 4 MG/2ML IJ SOLN
4.0000 mg | Freq: Once | INTRAMUSCULAR | Status: DC
  Filled 2020-01-31: qty 2

## 2020-01-31 NOTE — Social Work (Signed)
CSW met with pt at bedside. CSW introduced self and explained her role. CSW completed sbirt with pt.  Pt scored a 0 on the sbirt scale. Pt denied alcohol use. Pt denied substance use. Pt did not need resources at this time.  Clorinda Wyble, LCSWA, LCASA Clinical Social Worker [phone]  

## 2020-01-31 NOTE — Progress Notes (Signed)
STROKE TEAM PROGRESS NOTE   INTERVAL HISTORY Her husband is at the bedside.  She complains of mild left sided incoordination and gait ataxia.  She was able to get up and walk but needed some help today.  Vital signs are stable.  2D echo is unremarkable.  Transcranial Doppler bubble study and lower extremity venous Dopplers are pending.  Hypercoagulable labs also pending.  ESR is normal.  Vitals:   01/31/20 0700 01/31/20 0937 01/31/20 1056 01/31/20 1132  BP: (!) 146/75     Pulse: 77  83   Resp: (!) 21 18  (!) 21  Temp: 98.6 F (37 C)     TempSrc: Oral     SpO2: 99%     Weight:      Height:        CBC:  Recent Labs  Lab 01/29/20 2204  WBC 9.1  NEUTROABS 5.7  HGB 11.9*  HCT 37.8  MCV 84.0  PLT 833    Basic Metabolic Panel:  Recent Labs  Lab 01/29/20 2234  NA 138  K 3.1*  CL 103  CO2 24  GLUCOSE 153*  BUN 16  CREATININE 0.48  CALCIUM 8.6*   Lipid Panel:     Component Value Date/Time   CHOL 202 (H) 01/30/2020 1021   CHOL 191 09/09/2015 0804   TRIG 27 01/30/2020 1021   HDL 59 01/30/2020 1021   HDL 60 09/09/2015 0804   CHOLHDL 3.4 01/30/2020 1021   VLDL 5 01/30/2020 1021   LDLCALC 138 (H) 01/30/2020 1021   LDLCALC 120 (H) 09/09/2015 0804   HgbA1c:  Lab Results  Component Value Date   HGBA1C 6.0 (H) 01/30/2020   Urine Drug Screen:     Component Value Date/Time   LABOPIA NONE DETECTED 01/29/2020 2358   COCAINSCRNUR NONE DETECTED 01/29/2020 2358   LABBENZ NONE DETECTED 01/29/2020 2358   AMPHETMU NONE DETECTED 01/29/2020 2358   THCU NONE DETECTED 01/29/2020 2358   LABBARB NONE DETECTED 01/29/2020 2358    Alcohol Level     Component Value Date/Time   ETH <10 01/29/2020 2207    IMAGING past 24 hours CT HEAD WO CONTRAST  Result Date: 01/31/2020 CLINICAL DATA:  Stroke follow-up EXAM: CT HEAD WITHOUT CONTRAST TECHNIQUE: Contiguous axial images were obtained from the base of the skull through the vertex without intravenous contrast. COMPARISON:  Brain  MRI 01/30/2020 FINDINGS: Brain: Unchanged size of left cerebellar infarct compared to the earlier brain MRI. Small amount of petechial hemorrhage medially. No midline shift. Mild mass effect on the cerebral aqueduct. No hydrocephalus. Vascular: No hyperdense vessel or unexpected calcification. Skull: Normal. Negative for fracture or focal lesion. Sinuses/Orbits: No acute finding. Other: None. IMPRESSION: 1. Unchanged size of left cerebellar infarct compared to the earlier brain MRI. Small amount of petechial hemorrhage medially. 2. Mild mass effect on the cerebral aqueduct without midline shift or hydrocephalus. Electronically Signed   By: Ulyses Jarred M.D.   On: 01/31/2020 01:33   MR BRAIN WO CONTRAST  Result Date: 01/30/2020 CLINICAL DATA:  Stroke, follow-up. Additional history provided: Patient reports dizziness, imbalance, diplopia and vomiting, status post tPA EXAM: MRI HEAD WITHOUT CONTRAST TECHNIQUE: Multiplanar, multiecho pulse sequences of the brain and surrounding structures were obtained without intravenous contrast. COMPARISON:  CT angiogram head/neck 01/29/2020, noncontrast head CT 01/29/2020. FINDINGS: Brain: There is a 4 cm focus of restricted diffusion consistent with acute infarct within the superomedial left cerebellar hemisphere. A small focus of acute infarction is also present within the left posterior  midbrain. Corresponding T2/FLAIR hyperintensity at these sites. Petechial hemorrhage at site of the left cerebellar infarct. Associated mass effect with partial effacement of the fourth ventricle. No evidence of hydrocephalus at this time. There is no evidence of intracranial mass. No midline shift or extra-axial fluid collection. No significant white matter disease for age. Cerebral volume is normal. Vascular: Flow voids maintained within the proximal large arterial vessels. Skull and upper cervical spine: No focal marrow lesion. Sinuses/Orbits: Paranasal sinus mucosal thickening greatest  within the left sphenoid sinus. Small right mastoid effusion. IMPRESSION: 4 cm acute infarct within the superomedial left cerebellar hemisphere with mild petechial hemorrhage. Associated mass effect with partial effacement of the fourth ventricle. No hydrocephalus on the current exam. A small acute infarct is also present within the left posterior midbrain. Paranasal sinus mucosal thickening. Right mastoid effusion. Electronically Signed   By: Kellie Simmering DO   On: 01/30/2020 15:16   VAS [place] TRANSCRANIAL DOPPLER W BUBBLES  Result Date: 01/31/2020  Transcranial Doppler with Bubble Indications: Stroke. Limitations for diagnostic windows: Unable to insonate right transtemporal window. Unable to insonate left transtemporal window. Performing Technologist: Abram Sander RVS  Examination Guidelines: A complete evaluation includes B-mode imaging, spectral Doppler, color Doppler, and power Doppler as needed of all accessible portions of each vessel. Bilateral testing is considered an integral part of a complete examination. Limited examinations for reoccurring indications may be performed as noted.  Summary:  A vascular evaluation was performed. The right carotid siphon was studied. An IV was inserted into the patient's right forearm . Verbal informed consent was obtained.  No HITS heard at rest. No HITS heard during valsalva. Negative TCd Bubble study *See table(s) above for TCD measurements and observations.  Diagnosing physician: Antony Contras MD Electronically signed by Antony Contras MD on 01/31/2020 at 1:47:34 PM.    Final    VAS [place] LOWER EXTREMITY VENOUS (DVT)  Result Date: 01/31/2020  Lower Venous DVTStudy Indications: Stroke.  Comparison Study: no prior Performing Technologist: Abram Sander RVS  Examination Guidelines: A complete evaluation includes B-mode imaging, spectral Doppler, color Doppler, and power Doppler as needed of all accessible portions of each vessel. Bilateral testing is considered an integral  part of a complete examination. Limited examinations for reoccurring indications may be performed as noted. The reflux portion of the exam is performed with the patient in reverse Trendelenburg.  +---------+---------------+---------+-----------+----------+--------------+ RIGHT    CompressibilityPhasicitySpontaneityPropertiesThrombus Aging +---------+---------------+---------+-----------+----------+--------------+ CFV      Full           Yes      Yes                                 +---------+---------------+---------+-----------+----------+--------------+ SFJ      Full                                                        +---------+---------------+---------+-----------+----------+--------------+ FV Prox  Full                                                        +---------+---------------+---------+-----------+----------+--------------+ FV Mid   Full                                                        +---------+---------------+---------+-----------+----------+--------------+  FV DistalFull                                                        +---------+---------------+---------+-----------+----------+--------------+ PFV      Full                                                        +---------+---------------+---------+-----------+----------+--------------+ POP      Full           Yes      Yes                                 +---------+---------------+---------+-----------+----------+--------------+ PTV      Full                                                        +---------+---------------+---------+-----------+----------+--------------+ PERO     Full                                                        +---------+---------------+---------+-----------+----------+--------------+   +---------+---------------+---------+-----------+----------+--------------+ LEFT     CompressibilityPhasicitySpontaneityPropertiesThrombus Aging  +---------+---------------+---------+-----------+----------+--------------+ CFV      Full           Yes      Yes                                 +---------+---------------+---------+-----------+----------+--------------+ SFJ      Full                                                        +---------+---------------+---------+-----------+----------+--------------+ FV Prox  Full                                                        +---------+---------------+---------+-----------+----------+--------------+ FV Mid   Full                                                        +---------+---------------+---------+-----------+----------+--------------+ FV DistalFull                                                        +---------+---------------+---------+-----------+----------+--------------+   PFV      Full                                                        +---------+---------------+---------+-----------+----------+--------------+ POP      Full           Yes      Yes                                 +---------+---------------+---------+-----------+----------+--------------+ PTV      Full                                                        +---------+---------------+---------+-----------+----------+--------------+ PERO     Full                                                        +---------+---------------+---------+-----------+----------+--------------+     Summary: BILATERAL: - No evidence of deep vein thrombosis seen in the lower extremities, bilaterally.   *See table(s) above for measurements and observations.    Preliminary     PHYSICAL EXAM Pleasant middle-aged [place] origin lady not in distress GENERAL- no acute distress HEENT-  [place]/AT  Lungs - Respirations unlabored Abdomen - Nondistended Extremities - No edema  Neurologic Examination: Mental Status:  Alert, fully oriented, thought content appropriate.  Speech fluent  without evidence of aphasia.  Able to follow all commands without difficulty. Cranial Nerves: II:  Visual fields intact. PERRL.  III,IV, VI: No ptosis. EOMI. No nystagmus.  V,VII: No facial droop. Temp sensation equal bilaterally  VIII: hearing intact to voice.  IX,X: Palate rises symmetrically XI: Symmetric XII: midline tongue extension  Motor: Right :  Upper extremity   5/5                                      Left:     Upper extremity   5/5             Lower extremity   5/5                                                  Lower extremity   5/5 Sensory: Temp and light touch intact x 4 Deep Tendon Reflexes:  2+ bilateral upper and lower extremities.  Plantars: Right: downgoing                           Left: downgoing Cerebellar:  Mild left finger-to-nose and knee to heel incoordination. Gait:  Mild ataxia and leaning to the left ASSESSMENT/PLAN Ms. Brad Mcgaughy is a 59 y.o. female presenting from [facility] s/p IVtPA with acute stroke-like symptoms  of dizziness, diplopia, vomiting and imbalance.    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    CTA head & neck No LVO or stensois  MRI left superior cerebellar infarct   2D Echo normal ejection fraction.  No cardiac source of embolism.  LDL 138  HgbA1c 6.0  no VTE prophylaxis till 24h post tPA    Diet   Diet vegetarian Room service appropriate? Yes; Fluid consistency: Thin    none prior to admission, now still less than 24h post tPA  Therapy recommendations:  pending  Disposition:  pending  No dx of Hypertension . Long-term BP goal normotensive  Hyperlipidemia  Home meds:none  LDL 138, goal < 70  Add Lipitor '40mg'$  PO daily  Continue statin at discharge  No dx of Diabetes    HgbA1c 6.0, goal < 7.0  Other Stroke Risk Factors  none   Hospital day # 1    She presented with sudden onset of vertigo, nausea vomiting, dizziness and gait ataxia due to  embolic left cerebellar infarct of cryptogenic etiology.  Continue ongoing stroke work-up and check transcranial Doppler bubble study for PFO, lower extremity Dopplers for venous DVT and follow hypercoagulable panel labs.  Continue physical and occupational therapy.  Likely discharge home in the next few days if stable she may need outpatient transesophageal echo and 30-day heart monitoring after discharge may consider possible participation in the [place] trial if interested long discussion patient and husband at the bedside and answered questions. I have spent a total of   25 minutes with the patient reviewing hospital notes,  test results, labs and examining the patient as well as establishing an assessment and plan that was discussed personally with the patient.  > 50% of time was spent in direct patient care.D/w Dr Irena Reichmann upon pt`s request       Antony Contras, MD Medical Director Welcome Pager: [phone] 01/31/2020 1:54 PM  To contact Stroke Continuity provider, please refer to [email or web address] After hours, contact General Neurology

## 2020-01-31 NOTE — Evaluation (Signed)
Physical Therapy Evaluation Patient Details Name: Judy Gutierrez MRN: [id number] DOB: 07-16-1961 Today's Date: 01/31/2020   History of Present Illness  Pt is a 59 yo female presenting with c/o sudden onset vertigo, double vision dizziness and gait ataxia which lasted at least for about an hour and improved after she got IV TPA on 4/17. 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 Pt with no significant PMH.  Clinical Impression  Pt in bed upon arrival of PT, agreeable to evaluation at this time. Prior to admission the pt was completely independent, working, and living at home with her spouse. The pt now presents with limitations in functional mobility, activity tolerance, and stability due to above dx, and will continue to benefit from skilled PT to address these deficits. The pt was able to demo good tolerance for bed mobility and initial transfers, but remains limited by sig onset of vertigo and nausea with any change in positioning. The pt was able to demo a short ambulation in her room, but relies heavily on BUE support on RW due to ataxic gait with significant lateral movement and minA needed for stability. The pt will continue to benefit from skilled PT to progress functional mobility and stability prior to d/c.      Follow Up Recommendations Outpatient PT;Supervision for mobility/OOB(OP vestibular PT)    Equipment Recommendations  Rolling walker with 5" wheels;3in1 (PT)    Recommendations for Other Services       Precautions / Restrictions Precautions Precautions: Fall Restrictions Weight Bearing Restrictions: No      Mobility  Bed Mobility Overal bed mobility: Needs Assistance Bed Mobility: Supine to Sit;Sit to Supine     Supine to sit: Supervision Sit to supine: Supervision   General bed mobility comments: supervision and extra time with use of bed rails, difficulty coordinating movements to scoot to  EOB  Transfers Overall transfer level: Needs assistance Equipment used: Rolling walker (2 wheeled);1 person hand held assist Transfers: Sit to/from Stand Sit to Stand: Min assist         General transfer comment: modA with bilateral HHA, minA with RW for UE support. Pt with sig sway upon stance  Ambulation/Gait Ambulation/Gait assistance: Min assist Gait Distance (Feet): 15 Feet Assistive device: Rolling walker (2 wheeled) Gait Pattern/deviations: Ataxic;Staggering left;Staggering right;Narrow base of support;Decreased stride length   Gait velocity interpretation: <1.8 ft/sec, indicate of risk for recurrent falls General Gait Details: Pt with very slow and ataxic gait with impaired fluidity of BLE movement and foot placement. Pt with sig reliance on BUE.  Stairs            Wheelchair Mobility    Modified Rankin (Stroke Patients Only) Modified Rankin (Stroke Patients Only) Pre-Morbid Rankin Score: No symptoms Modified Rankin: Moderately severe disability     Balance Overall balance assessment: Needs assistance Sitting-balance support: Single extremity supported;Feet supported Sitting balance-Leahy Scale: Fair       Standing balance-Leahy Scale: Poor Standing balance comment: sig reliance on BUE for static stance and mobility                             Pertinent Vitals/Pain Pain Assessment: No/denies pain    Home Living Family/patient expects to be discharged to:: Private residence Living Arrangements: Spouse/significant other Available Help at Discharge: Friend(s);Family;Available PRN/intermittently(pt husband works during the day, her sister can visit while husband is at work) Type of Home: [employer]  Access: Stairs to enter Entrance Stairs-Rails: Left Entrance Stairs-Number of Steps: 4 steps with L rail Home Layout: Two level;1/2 bath on main level;Bed/bath upstairs Home Equipment: Tub bench Additional Comments: pt reports she may have a RW  at home from prior accident in  2014, unsure of any equipment    Prior Function Level of Independence: Independent         Comments: cooking, working at [employer], walking every day     Hand Dominance   Dominant Hand: Right    Extremity/Trunk Assessment   Upper Extremity Assessment Upper Extremity Assessment: LUE deficits/detail LUE Deficits / Details: sig dysmetria with Finger-Nose-Finger 1/5 reps performed correctly. LUE Sensation: WNL LUE Coordination: decreased fine motor    Lower Extremity Assessment Lower Extremity Assessment: LLE deficits/detail;RLE deficits/detail(pt generally 4/5 strength in hips, impaired gross and fine motor bilaterally.) RLE Deficits / Details: generally 4/5 hip flexion, 5/5 knee ext and flexion RLE Coordination: decreased gross motor;decreased fine motor LLE Deficits / Details: generally 4/5 hip flexion, 5/5 knee ext and flexion LLE Coordination: decreased gross motor;decreased fine motor    Cervical / Trunk Assessment Cervical / Trunk Assessment: Normal  Communication   Communication: No difficulties  Cognition Arousal/Alertness: Awake/alert Behavior During Therapy: WFL for tasks assessed/performed Overall Cognitive Status: Within Functional Limits for tasks assessed                                 General Comments: good safety awareness,  pleasant and cooperative      General Comments General comments (skin integrity, edema, etc.): Pt reports no changes in vision, sig nausea and diziness with any mobility that slowly subsides with immobility regardless of position.    Exercises     Assessment/Plan    PT Assessment Patient needs continued PT services  PT Problem List Decreased strength;Decreased mobility;Decreased coordination;Decreased activity tolerance;Decreased balance;Decreased knowledge of use of DME       PT Treatment Interventions DME instruction;Therapeutic exercise;Gait training;Balance  training;Stair training;Neuromuscular re-education;Functional mobility training;Therapeutic activities;Patient/family education    PT Goals (Current goals can be found in the Care Plan section)  Acute Rehab PT Goals Patient Stated Goal: to get back to work without symptoms PT Goal Formulation: With patient Time For Goal Achievement: 02/14/20 Potential to Achieve Goals: Good    Frequency Min 4X/week   Barriers to discharge        Co-evaluation               AM-PAC PT "6 Clicks" Mobility  Outcome Measure Help needed turning from your back to your side while in a flat bed without using bedrails?: A Little Help needed moving from lying on your back to sitting on the side of a flat bed without using bedrails?: A Little Help needed moving to and from a bed to a chair (including a wheelchair)?: A Little Help needed standing up from a chair using your arms (e.g., wheelchair or bedside chair)?: A Little Help needed to walk in hospital room?: A Little Help needed climbing 3-5 steps with a railing? : A Lot 6 Click Score: 17    End of Session Equipment Utilized During Treatment: Gait belt Activity Tolerance: Other (comment);Patient tolerated treatment well(Pt limited by onset of vertigo/nausea with mobility) Patient left: in bed;with call bell/phone within reach;with bed alarm set(in chair position, no chair in room) Nurse Communication: Mobility status PT Visit Diagnosis: Difficulty in walking, not elsewhere classified (R26.2);Ataxic gait (R26.0)  Time: 1583-0940 PT Time Calculation (min) (ACUTE ONLY): 35 min   Charges:   PT Evaluation $PT Eval Moderate Complexity: 1 Mod PT Treatments $Gait Training: 8-22 mins        Karma Ganja, PT, DPT   Acute Rehabilitation Department Pager #: [phone]  Otho Bellows 01/31/2020, 11:08 AM

## 2020-01-31 NOTE — Plan of Care (Signed)
  Problem: Education: Goal: Knowledge of General Education information will improve Description: Including pain rating scale, medication(s)/side effects and non-pharmacologic comfort measures Outcome: Progressing   Problem: Health Behavior/Discharge Planning: Goal: Ability to manage health-related needs will improve Outcome: Progressing   Problem: Clinical Measurements: Goal: Ability to maintain clinical measurements within normal limits will improve Outcome: Progressing Goal: Will remain free from infection Outcome: Progressing Goal: Diagnostic test results will improve Outcome: Progressing Goal: Respiratory complications will improve Outcome: Progressing Goal: Cardiovascular complication will be avoided Outcome: Progressing   Problem: Activity: Goal: Risk for activity intolerance will decrease Outcome: Progressing   Problem: Nutrition: Goal: Adequate nutrition will be maintained Outcome: Progressing   Problem: Coping: Goal: Level of anxiety will decrease Outcome: Progressing   Problem: Elimination: Goal: Will not experience complications related to bowel motility Outcome: Progressing Goal: Will not experience complications related to urinary retention Outcome: Progressing   Problem: Pain Managment: Goal: General experience of comfort will improve Outcome: Progressing   Problem: Safety: Goal: Ability to remain free from injury will improve Outcome: Progressing   Problem: Skin Integrity: Goal: Risk for impaired skin integrity will decrease Outcome: Progressing   Problem: Education: Goal: Knowledge of disease or condition will improve Outcome: Progressing Goal: Knowledge of secondary prevention will improve Outcome: Progressing Goal: Knowledge of patient specific risk factors addressed and post discharge goals established will improve Outcome: Progressing Goal: Individualized Educational Video(s) Outcome: Progressing   Problem: Coping: Goal: Will verbalize  positive feelings about self Outcome: Progressing Goal: Will identify appropriate support needs Outcome: Progressing   Problem: Health Behavior/Discharge Planning: Goal: Ability to manage health-related needs will improve Outcome: Progressing   Problem: Self-Care: Goal: Ability to participate in self-care as condition permits will improve Outcome: Progressing Goal: Verbalization of feelings and concerns over difficulty with self-care will improve Outcome: Progressing Goal: Ability to communicate needs accurately will improve Outcome: Progressing   Problem: Nutrition: Goal: Risk of aspiration will decrease Outcome: Progressing   Problem: Ischemic Stroke/TIA Tissue Perfusion: Goal: Complications of ischemic stroke/TIA will be minimized Outcome: Progressing   Problem: Spontaneous Subarachnoid Hemorrhage Tissue Perfusion: Goal: Complications of Spontaneous Subarachnoid Hemorrhage will be minimized Outcome: Progressing

## 2020-01-31 NOTE — Plan of Care (Signed)
Plan of care reviewed with pt at bedside. Call bell in reach. Pt I/O for retention. Standby assist. Pt stable at this time, will continue to monitor.  Problem: Education: Goal: Knowledge of General Education information will improve Description: Including pain rating scale, medication(s)/side effects and non-pharmacologic comfort measures Outcome: Progressing   Problem: Health Behavior/Discharge Planning: Goal: Ability to manage health-related needs will improve Outcome: Progressing   Problem: Clinical Measurements: Goal: Ability to maintain clinical measurements within normal limits will improve Outcome: Progressing Goal: Will remain free from infection Outcome: Progressing Goal: Diagnostic test results will improve Outcome: Progressing Goal: Respiratory complications will improve Outcome: Progressing Goal: Cardiovascular complication will be avoided Outcome: Progressing   Problem: Activity: Goal: Risk for activity intolerance will decrease Outcome: Progressing   Problem: Nutrition: Goal: Adequate nutrition will be maintained Outcome: Progressing   Problem: Coping: Goal: Level of anxiety will decrease Outcome: Progressing   Problem: Elimination: Goal: Will not experience complications related to bowel motility Outcome: Progressing Goal: Will not experience complications related to urinary retention Outcome: Progressing   Problem: Pain Managment: Goal: General experience of comfort will improve Outcome: Progressing   Problem: Safety: Goal: Ability to remain free from injury will improve Outcome: Progressing   Problem: Skin Integrity: Goal: Risk for impaired skin integrity will decrease Outcome: Progressing   Problem: Education: Goal: Knowledge of disease or condition will improve Outcome: Progressing Goal: Knowledge of secondary prevention will improve Outcome: Progressing Goal: Knowledge of patient specific risk factors addressed and post discharge goals  established will improve Outcome: Progressing Goal: Individualized Educational Video(s) Outcome: Progressing   Problem: Coping: Goal: Will verbalize positive feelings about self Outcome: Progressing Goal: Will identify appropriate support needs Outcome: Progressing   Problem: Health Behavior/Discharge Planning: Goal: Ability to manage health-related needs will improve Outcome: Progressing   Problem: Self-Care: Goal: Ability to participate in self-care as condition permits will improve Outcome: Progressing Goal: Verbalization of feelings and concerns over difficulty with self-care will improve Outcome: Progressing Goal: Ability to communicate needs accurately will improve Outcome: Progressing   Problem: Nutrition: Goal: Risk of aspiration will decrease Outcome: Progressing   Problem: Ischemic Stroke/TIA Tissue Perfusion: Goal: Complications of ischemic stroke/TIA will be minimized Outcome: Progressing   Problem: Spontaneous Subarachnoid Hemorrhage Tissue Perfusion: Goal: Complications of Spontaneous Subarachnoid Hemorrhage will be minimized Outcome: Progressing

## 2020-01-31 NOTE — Evaluation (Signed)
Speech Language Pathology Evaluation Patient Details Name: Judy Gutierrez MRN: [id number] DOB: 1960-12-03 Today's Date: 01/31/2020 Time: 7106-2694 SLP Time Calculation (min) (ACUTE ONLY): 23 min  Problem List:  Patient Active Problem List   Diagnosis Date Noted  . 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 Musculoskeletal chest pain 09/10/2014  . Hand pain 02/12/2014  . Fracture of left ulna 11/05/2013  . E. coli UTI 11/01/2013  . Anemia due to blood loss, acute 10/24/2013  . Pelvic fracture (HCC) 10/23/2013  . Hyperglycemia 09/18/2013  . Routine general medical examination at a health care facility 09/18/2013  . Elevated alkaline phosphatase level 09/18/2013  . Hot flashes, menopausal 04/10/2013  . Pain in joint, upper arm 04/10/2013   Past Medical History:  Past Medical History:  Diagnosis Date  . Disturbance of skin sensation   . Myalgia and myositis, unspecified   . Other B-complex deficiencies   . 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 Unspecified essential hypertension    Past Surgical History:  Past Surgical History:  Procedure Laterality Date  . CESAREAN SECTION  1992   HPI:  Pt is a 59 yo female presenting with c/o sudden onset vertigo, double vision dizziness and gait ataxia which lasted at least for about an hour and improved after she got IV TPA on 4/17. 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 Pt with no significant PMH.   Assessment / Plan / Recommendation Clinical Impression  Pt assessed in the areas of speech-language and cognition. Pt and family friend report at initiation of stroke pt's speech was jargon but has since resolved without dysarthria. She exhibited difficulty with word retrieval and recalled 2 of 5 words after 6 minute delay and 3/5 with semantic or choice cue. Pt's attention, orientation, problem solving and reasoning  were all within normal limits. Pt reports she uses writing as strategy to recall important information. Pt will likely be able to return to normal ADL's when appropriate.       SLP Assessment  SLP Recommendation/Assessment: Patient does not need any further Speech Lanaguage Pathology Services SLP Visit Diagnosis: Cognitive communication deficit (R41.841)    Follow Up Recommendations       Frequency and Duration           SLP Evaluation Cognition  Overall Cognitive Status: Within Functional Limits for tasks assessed Arousal/Alertness: Awake/alert Orientation Level: Oriented X4 Attention: Sustained Sustained Attention: Appears intact Memory: Impaired Memory Impairment: Storage deficit Awareness: Appears intact Problem Solving: Appears intact Safety/Judgment: Appears intact       Comprehension  Auditory Comprehension Overall Auditory Comprehension: Appears within functional limits for tasks assessed Visual Recognition/Discrimination Discrimination: Not tested Reading Comprehension Reading Status: Not tested    Expression Expression Primary Mode of Expression: Verbal Verbal Expression Overall Verbal Expression: Appears within functional limits for tasks assessed Written Expression Dominant Hand: Right   Oral / Motor  Oral Motor/Sensory Function Overall Oral Motor/Sensory Function: Within functional limits Motor Speech Overall Motor Speech: Appears within functional limits for tasks assessed   GO                    Judy Gutierrez 01/31/2020, 4:24 PM   Breck Coons Judy Gutierrez Nurse, children's [phone] Office [phone]

## 2020-01-31 NOTE — Progress Notes (Signed)
TCD bubble study and lower extremity venous has been completed.   Preliminary results in CV Proc.   Blanch Media 01/31/2020 1:40 PM

## 2020-01-31 NOTE — Evaluation (Signed)
Occupational Therapy Evaluation Patient Details Name: Judy Gutierrez MRN: [id number] DOB: [birth year] Today's Date: 01/31/2020    History of Present Illness Pt is a 59 yo female presenting with c/o sudden onset vertigo, double vision dizziness and gait ataxia which lasted at least for about an hour and improved after she got IV TPA on 4/17. 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 Pt with no significant PMH.   Clinical Impression   Pt typically functions independently. Presents with L UE incoordination, dizziness and impaired balance. She requires up to moderate assistance for ADL and mobility. Will follow acutely.     Follow Up Recommendations  Outpatient OT    Equipment Recommendations  3 in 1 bedside commode(if she does not already have)    Recommendations for Other Services       Precautions / Restrictions Precautions Precautions: Fall Restrictions Weight Bearing Restrictions: No      Mobility Bed Mobility Overal bed mobility: Needs Assistance Bed Mobility: Supine to Sit;Sit to Supine     Supine to sit: Supervision Sit to supine: Supervision   General bed mobility comments: increased time and effort, inefficient  Transfers Overall transfer level: Needs assistance Equipment used: 1 person hand held assist Transfers: Sit to/from Stand Sit to Stand: Min assist;Mod assist         General transfer comment: cues for visual stabilization, steadying assist, min from bed, mod from toilet    Balance Overall balance assessment: Needs assistance Sitting-balance support: Single extremity supported;Feet supported Sitting balance-Leahy Scale: Fair       Standing balance-Leahy Scale: Poor Standing balance comment: reliant on one hand assist in static standing, 2 when attempting to step                           ADL either performed or assessed with clinical judgement   ADL Overall  ADL's : Needs assistance/impaired Eating/Feeding: Independent   Grooming: Standing;Wash/dry hands;Minimal assistance   Upper Body Bathing: Sitting;Minimal assistance   Lower Body Bathing: Sit to/from stand;Moderate assistance   Upper Body Dressing : Sitting;Minimal assistance   Lower Body Dressing: Sit to/from stand;Moderate assistance   Toilet Transfer: Ambulation;Moderate assistance   Toileting- Clothing Manipulation and Hygiene: Minimal assistance;Sitting/lateral lean       Functional mobility during ADLs: Moderate assistance General ADL Comments: Educated in visual stabilization techniques and fall prevention. pt states she thinks she has a seat for her shower     Vision Baseline Vision/History: Wears glasses Wears Glasses: At all times Patient Visual Report: No change from baseline       Perception     Praxis      Pertinent Vitals/Pain Pain Assessment: No/denies pain     Hand Dominance Right   Extremity/Trunk Assessment Upper Extremity Assessment Upper Extremity Assessment: LUE deficits/detail LUE Deficits / Details: incoordination with finger to nose/ finger to finger LUE Sensation: WNL LUE Coordination: decreased fine motor;decreased gross motor   Lower Extremity Assessment Lower Extremity Assessment: Defer to PT evaluation   Cervical / Trunk Assessment Cervical / Trunk Assessment: Normal   Communication Communication Communication: No difficulties   Cognition Arousal/Alertness: Awake/alert Behavior During Therapy: WFL for tasks assessed/performed Overall Cognitive Status: Within Functional Limits for tasks assessed  General Comments       Exercises     Shoulder Instructions      Home Living Family/patient expects to be discharged to:: Private residence Living Arrangements: Spouse/significant other Available Help at Discharge: Friend(s);Family;Available PRN/intermittently(sister can stay  when husband works) Type of Home: House Home Access: Stairs to enter [employer] of Steps: 4 steps with L rail Entrance Stairs-Rails: Left Home Layout: Two level;1/2 bath on main level;Bed/bath upstairs Alternate Level Stairs-Number of Steps: flight of stairs with L rail Alternate Level Stairs-Rails: Left Bathroom Shower/Tub: Occupational psychologist: Standard     Home Equipment: Tub bench   Additional Comments: pt reports she may have a RW at home from prior accident in  2014, unsure of any equipment      Prior Functioning/Environment Level of Independence: Independent        Comments: cooking, working at [employer], walking every day        OT Problem List: Impaired balance (sitting and/or standing);Decreased coordination;Decreased knowledge of use of DME or AE;Impaired tone;Impaired UE functional use      OT Treatment/Interventions: Self-care/ADL training;DME and/or AE instruction;Therapeutic activities;Patient/family education;Balance training;Neuromuscular education    OT Goals(Current goals can be found in the care plan section) Acute Rehab OT Goals Patient Stated Goal: to get back to work without symptoms OT Goal Formulation: With patient Time For Goal Achievement: 02/14/20 Potential to Achieve Goals: Good ADL Goals Pt Will Perform Grooming: with supervision;standing Pt Will Perform Lower Body Bathing: with supervision;sit to/from stand Pt Will Perform Lower Body Dressing: with supervision;sit to/from stand Pt Will Transfer to Toilet: with supervision;ambulating;bedside commode Pt Will Perform Toileting - Clothing Manipulation and hygiene: with supervision;sit to/from stand Pt Will Perform Tub/Shower Transfer: with supervision;ambulating;tub bench;Tub transfer Additional ADL Goal #2: Pt will use visual target compensatory strategies during mobility with minimal verbal cues.  OT Frequency: Min 3X/week   Barriers to D/C:             Co-evaluation              AM-PAC OT "6 Clicks" Daily Activity     Outcome Measure Help from another person eating meals?: A Little Help from another person taking care of personal grooming?: A Little Help from another person toileting, which includes using toliet, bedpan, or urinal?: A Lot Help from another person bathing (including washing, rinsing, drying)?: A Lot Help from another person to put on and taking off regular upper body clothing?: A Little Help from another person to put on and taking off regular lower body clothing?: A Lot 6 Click Score: 15   End of Session Equipment Utilized During Treatment: Gait belt  Activity Tolerance: Patient tolerated treatment well Patient left: in bed;with call bell/phone within reach;with bed alarm set  OT Visit Diagnosis: Ataxia, unspecified (R27.0);Other abnormalities of gait and mobility (R26.89);Unsteadiness on feet (R26.81);Dizziness and giddiness (R42);Hemiplegia and hemiparesis Hemiplegia - Right/Left: Left Hemiplegia - dominant/non-dominant: Non-Dominant Hemiplegia - caused by: Cerebral infarction                Time: 1200-1217 OT Time Calculation (min): 17 min Charges:  OT General Charges $OT Visit: 1 Visit OT Evaluation $OT Eval Moderate Complexity: 1 Mod  Nestor Lewandowsky, OTR/L Acute Rehabilitation Services Pager: [phone] Office: [phone]  Malka So 01/31/2020, 3:27 PM

## 2020-02-01 DIAGNOSIS — E7800 Pure hypercholesterolemia, unspecified: Secondary | ICD-10-CM

## 2020-02-01 DIAGNOSIS — I10 Essential (primary) hypertension: Secondary | ICD-10-CM

## 2020-02-01 DIAGNOSIS — Z9282 Status post administration of tPA (rtPA) in a different facility within the last 24 hours prior to admission to current facility: Secondary | ICD-10-CM

## 2020-02-01 LAB — BASIC METABOLIC PANEL
Anion gap: 8 (ref 5–15)
BUN: 15 mg/dL (ref 6–20)
CO2: 25 mmol/L (ref 22–32)
Calcium: 8.8 mg/dL — ABNORMAL LOW (ref 8.9–10.3)
Chloride: 106 mmol/L (ref 98–111)
Creatinine, Ser: 0.65 mg/dL (ref 0.44–1.00)
GFR calc Af Amer: >60 mL/min (ref >60)
GFR calc non Af Amer: >60 mL/min (ref >60)
Glucose, Bld: 107 mg/dL — ABNORMAL HIGH (ref 70–99)
Potassium: 3.3 mmol/L — ABNORMAL LOW (ref 3.5–5.1)
Sodium: 139 mmol/L (ref 135–145)

## 2020-02-01 LAB — CBC
HCT: 39.5 % (ref 36.0–46.0)
Hemoglobin: 12.4 g/dL (ref 12.0–15.0)
MCH: 25.8 pg — ABNORMAL LOW (ref 26.0–34.0)
MCHC: 31.4 g/dL (ref 30.0–36.0)
MCV: 82.1 fL (ref 80.0–100.0)
Platelets: 287 10*3/uL (ref 150–400)
RBC: 4.81 MIL/uL (ref 3.87–5.11)
RDW: 13.2 % (ref 11.5–15.5)
WBC: 6.2 10*3/uL (ref 4.0–10.5)
nRBC: 0 % (ref 0.0–0.2)

## 2020-02-01 LAB — ANTIPHOSPHOLIPID SYNDROME EVAL, BLD
Anticardiolipin IgA: <9 APL U/mL (ref 0–11)
Anticardiolipin IgG: <9 GPL U/mL (ref 0–14)
Anticardiolipin IgM: <9 MPL U/mL (ref 0–12)
DRVVT: 35.4 s (ref 0.0–47.0)
PTT Lupus Anticoagulant: 32.8 s (ref 0.0–51.9)
Phosphatydalserine, IgA: 7 APS IgA (ref 0–20)
Phosphatydalserine, IgG: 5 GPS IgG (ref 0–11)
Phosphatydalserine, IgM: 9 MPS IgM (ref 0–25)

## 2020-02-01 LAB — ANA W/REFLEX IF POSITIVE: Anti Nuclear Antibody (ANA): NEGATIVE

## 2020-02-01 MED ORDER — POTASSIUM CHLORIDE CRYS ER 20 MEQ PO TBCR
20.0000 meq | EXTENDED_RELEASE_TABLET | Freq: Once | ORAL | Status: AC
  Administered 2020-02-01: 20 meq via ORAL
  Filled 2020-02-01: qty 1

## 2020-02-01 MED ORDER — ATORVASTATIN CALCIUM 40 MG PO TABS: 40.0000 mg | ORAL_TABLET | Freq: Every day | ORAL | 1 refills | Status: DC

## 2020-02-01 MED ORDER — ASPIRIN 81 MG PO TBEC: 81.0000 mg | DELAYED_RELEASE_TABLET | Freq: Every day | ORAL | Status: DC

## 2020-02-01 MED ORDER — AMLODIPINE BESYLATE 5 MG PO TABS: 5.0000 mg | ORAL_TABLET | Freq: Every day | ORAL | 1 refills | Status: DC

## 2020-02-01 MED ORDER — POTASSIUM CHLORIDE CRYS ER 10 MEQ PO TBCR
EXTENDED_RELEASE_TABLET | ORAL | Status: AC
  Filled 2020-02-01: qty 2

## 2020-02-01 MED ORDER — POTASSIUM CHLORIDE CRYS ER 20 MEQ PO TBCR
40.0000 meq | EXTENDED_RELEASE_TABLET | ORAL | Status: AC
  Administered 2020-02-01: 20 meq via ORAL
  Administered 2020-02-01: 40 meq via ORAL
  Filled 2020-02-01 (×2): qty 2

## 2020-02-01 MED ORDER — CLOPIDOGREL BISULFATE 75 MG PO TABS: 75.0000 mg | ORAL_TABLET | Freq: Every day | ORAL | 0 refills | Status: AC

## 2020-02-01 NOTE — Progress Notes (Signed)
Physical Therapy Treatment Patient Details Name: Judy Gutierrez MRN: [id number] DOB: 07-Mar-1961 Today's Date: 02/01/2020    History of Present Illness Pt is a 59 yo female presenting with c/o sudden onset vertigo, double vision dizziness and gait ataxia which lasted at least for about an hour and improved after she got IV TPA on 4/17. 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 Pt with no significant PMH.    PT Comments    Pt in bed on arrival, husband present in room. Pleasant and agreeable to participation in therapy. She required min assist transfers and min guard assist ambulation 150' x 2 with RW. Ataxic gait pattern improving. Pt returned to bed at end of session. Current POC remains appropriate.    Follow Up Recommendations  Outpatient PT;Supervision for mobility/OOB(OP vestibular PT)     Equipment Recommendations  Rolling walker with 5" wheels;3in1 (PT)    Recommendations for Other Services       Precautions / Restrictions Precautions Precautions: Fall Restrictions Weight Bearing Restrictions: No    Mobility  Bed Mobility Overal bed mobility: Modified Independent             General bed mobility comments: HOB elevated, +rail, increased time  Transfers Overall transfer level: Needs assistance Equipment used: Rolling walker (2 wheeled) Transfers: Sit to/from Stand Sit to Stand: Min assist         General transfer comment: cues for hand placement, increased time, assist to power up and stabilize balance  Ambulation/Gait Ambulation/Gait assistance: Min guard Gait Distance (Feet): 150 Feet(x 2) Assistive device: Rolling walker (2 wheeled) Gait Pattern/deviations: Ataxic;Decreased stride length;Staggering left;Staggering right Gait velocity: decreased Gait velocity interpretation: <1.31 ft/sec, indicative of household ambulator General Gait Details: slow, guarded gait; cues to look  up   Stairs             Wheelchair Mobility    Modified Rankin (Stroke Patients Only) Modified Rankin (Stroke Patients Only) Pre-Morbid Rankin Score: No symptoms Modified Rankin: Moderately severe disability     Balance Overall balance assessment: Needs assistance Sitting-balance support: Feet supported;No upper extremity supported Sitting balance-Leahy Scale: Fair     Standing balance support: Bilateral upper extremity supported;During functional activity Standing balance-Leahy Scale: Poor Standing balance comment: reliant on UE support                            Cognition Arousal/Alertness: Awake/alert Behavior During Therapy: WFL for tasks assessed/performed Overall Cognitive Status: Within Functional Limits for tasks assessed                                        Exercises      General Comments        Pertinent Vitals/Pain Pain Assessment: No/denies pain    Home Living                      Prior Function            PT Goals (current goals can now be found in the care plan section) Acute Rehab PT Goals Patient Stated Goal: to get back to work without symptoms Progress towards PT goals: Progressing toward goals    Frequency    Min 4X/week      PT Plan Current plan remains appropriate    Co-evaluation  AM-PAC PT "6 Clicks" Mobility   Outcome Measure  Help needed turning from your back to your side while in a flat bed without using bedrails?: None Help needed moving from lying on your back to sitting on the side of a flat bed without using bedrails?: A Little Help needed moving to and from a bed to a chair (including a wheelchair)?: A Little Help needed standing up from a chair using your arms (e.g., wheelchair or bedside chair)?: A Little Help needed to walk in hospital room?: A Little Help needed climbing 3-5 steps with a railing? : A Little 6 Click Score: 19    End of Session  Equipment Utilized During Treatment: Gait belt Activity Tolerance: Patient tolerated treatment well Patient left: in bed;with call bell/phone within reach;with family/visitor present Nurse Communication: Mobility status PT Visit Diagnosis: Difficulty in walking, not elsewhere classified (R26.2);Ataxic gait (R26.0)     Time: 3832-9191 PT Time Calculation (min) (ACUTE ONLY): 24 min  Charges:  $Gait Training: 23-37 mins                     Judy Gutierrez, [place]  Office # [phone] Pager [phone]    Judy Gutierrez 02/01/2020, 12:10 PM

## 2020-02-01 NOTE — Discharge Summary (Addendum)
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 Disturbance of skin sensation   . Myalgia and myositis, unspecified   . Other B-complex deficiencies   . 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 Unspecified essential hypertension    Past Surgical History:  Procedure Laterality Date  . CESAREAN SECTION  1992    Family History Family History  Problem Relation Age of Onset  . Pulmonary embolism Mother   . Hepatitis Father   . Diabetes Mellitus II Sister   . Breast cancer Neg Hx     Social History  reports that she has never smoked. She has never used smokeless tobacco. She reports that she does not drink alcohol or use drugs.  Allergies as of 02/01/2020   No Known Allergies     Medication List    TAKE these medications   amLODipine 5 MG tablet Commonly known as: NORVASC Take 1 tablet (5 mg total) by mouth daily. Start taking on: February 02, 2020   aspirin 81 MG EC tablet Take 1 tablet (81 mg total) by mouth daily. Start taking on: February 02, 2020   atorvastatin 40 MG tablet Commonly known as: LIPITOR Take 1 tablet (40 mg total) by mouth daily. Start taking on: February 02, 2020   clopidogrel 75 MG tablet Commonly known as: PLAVIX Take 1 tablet (75 mg total) by mouth daily for 20 days. Start taking on: February 02, 2020       HOME MEDICATIONS PRIOR TO ADMISSION No  medications prior to admission.     HOSPITAL MEDICATIONS .  stroke: mapping our early stages of recovery book   Does not apply Once  . amLODipine  5 mg Oral Daily  . aspirin EC  81 mg Oral Daily  . atorvastatin  40 mg Oral Daily  . Chlorhexidine Gluconate Cloth  6 each Topical Daily  . clopidogrel  75 mg Oral Daily  . diphenhydrAMINE  25 mg Intravenous Once  . labetalol  10 mg Intravenous Once  . ondansetron (ZOFRAN) IV  4 mg Intravenous Once  . pantoprazole  40 mg Oral QHS  . prochlorperazine  10 mg Intravenous Once    LABORATORY STUDIES CBC    Component Value Date/Time   WBC 6.2 02/01/2020 0448   RBC 4.81 02/01/2020 0448   HGB 12.4 02/01/2020 0448   HGB 12.4 09/09/2015 0804   HCT 39.5 02/01/2020 0448   HCT 37.0 09/09/2015 0804   PLT 287 02/01/2020 0448   PLT 289 09/09/2015 0804   MCV 82.1 02/01/2020 0448   MCV 80 09/09/2015 0804   MCH 25.8 (L) 02/01/2020 0448   MCHC 31.4 02/01/2020 0448   RDW 13.2 02/01/2020 0448   RDW 14.1 09/09/2015 0804   LYMPHSABS 2.7 01/29/2020 2204   LYMPHSABS 2.0 09/09/2015 0804   MONOABS 0.4 01/29/2020 2204   EOSABS 0.2  01/29/2020 2204   EOSABS 0.2 09/09/2015 0804   BASOSABS 0.0 01/29/2020 2204   BASOSABS 0.0 09/09/2015 0804   CMP    Component Value Date/Time   NA 139 02/01/2020 0448   NA 141 09/09/2015 0804   K 3.3 (L) 02/01/2020 0448   CL 106 02/01/2020 0448   CO2 25 02/01/2020 0448   GLUCOSE 107 (H) 02/01/2020 0448   BUN 15 02/01/2020 0448   BUN 16 09/09/2015 0804   CREATININE 0.65 02/01/2020 0448   CREATININE 0.54 10/14/2016 0803   CALCIUM 8.8 (L) 02/01/2020 0448   PROT 8.1 01/29/2020 2234   PROT 7.4 09/09/2015 0804   ALBUMIN 4.0 01/29/2020 2234   ALBUMIN 4.2 09/09/2015 0804   AST 42 (H) 01/29/2020 2234   ALT 29 01/29/2020 2234   ALKPHOS 112 01/29/2020 2234   BILITOT 0.6 01/29/2020 2234   BILITOT 0.4 09/09/2015 0804   GFRNONAA >60 02/01/2020 0448   GFRNONAA >89 10/14/2016 0803   GFRAA >60 02/01/2020 0448   GFRAA >89  10/14/2016 0803   COAGS Lab Results  Component Value Date   INR 1.0 01/29/2020   Lipid Panel    Component Value Date/Time   CHOL 202 (H) 01/30/2020 1021   CHOL 191 09/09/2015 0804   TRIG 27 01/30/2020 1021   HDL 59 01/30/2020 1021   HDL 60 09/09/2015 0804   CHOLHDL 3.4 01/30/2020 1021   VLDL 5 01/30/2020 1021   LDLCALC 138 (H) 01/30/2020 1021   LDLCALC 120 (H) 09/09/2015 0804   HgbA1C  Lab Results  Component Value Date   HGBA1C 6.0 (H) 01/30/2020   Urinalysis    Component Value Date/Time   COLORURINE YELLOW 01/29/2020 2358   APPEARANCEUR CLEAR 01/29/2020 2358   APPEARANCEUR Clear 09/09/2015 0805   LABSPEC 1.010 01/29/2020 2358   PHURINE 7.5 01/29/2020 2358   GLUCOSEU 100 (A) 01/29/2020 2358   HGBUR NEGATIVE 01/29/2020 2358   BILIRUBINUR NEGATIVE 01/29/2020 2358   BILIRUBINUR Negative 09/09/2015 0805   KETONESUR 15 (A) 01/29/2020 2358   PROTEINUR NEGATIVE 01/29/2020 2358   UROBILINOGEN 0.2 10/23/2013 1906   NITRITE NEGATIVE 01/29/2020 2358   LEUKOCYTESUR SMALL (A) 01/29/2020 2358   Urine Drug Screen     Component Value Date/Time   LABOPIA NONE DETECTED 01/29/2020 2358   COCAINSCRNUR NONE DETECTED 01/29/2020 2358   LABBENZ NONE DETECTED 01/29/2020 2358   AMPHETMU NONE DETECTED 01/29/2020 2358   THCU NONE DETECTED 01/29/2020 2358   LABBARB NONE DETECTED 01/29/2020 2358    Alcohol Level    Component Value Date/Time   ETH <10 01/29/2020 2207     SIGNIFICANT DIAGNOSTIC STUDIES   CT Code Stroke CTA Head W/WO contrast  Result Date: 01/30/2020 CLINICAL DATA:  Dizziness and nausea EXAM: CT ANGIOGRAPHY HEAD AND NECK TECHNIQUE: Multidetector CT imaging of the head and neck was performed using the standard protocol during bolus administration of intravenous contrast. Multiplanar CT image reconstructions and MIPs were obtained to evaluate the vascular anatomy. Carotid stenosis measurements (when applicable) are obtained utilizing NASCET criteria, using the distal  internal carotid diameter as the denominator. CONTRAST:  OMNIPAQUE IOHEXOL 350 MG/ML SOLN COMPARISON:  None. FINDINGS: CT HEAD FINDINGS Brain: There is no mass, hemorrhage or extra-axial collection. The size and configuration of the ventricles and extra-axial CSF spaces are normal. There is no acute or chronic infarction. The brain parenchyma is normal. Skull: The visualized skull base, calvarium and extracranial soft tissues are normal. Sinuses/Orbits: No fluid levels or advanced mucosal thickening of the visualized  paranasal sinuses. No mastoid or middle ear effusion. The orbits are normal. CTA NECK FINDINGS SKELETON: There is no bony spinal canal stenosis. No lytic or blastic lesion. OTHER NECK: Normal pharynx, larynx and major salivary glands. No cervical lymphadenopathy. Unremarkable thyroid gland. UPPER CHEST: No pneumothorax or pleural effusion. No nodules or masses. AORTIC ARCH: There is no calcific atherosclerosis of the aortic arch. There is no aneurysm, dissection or hemodynamically significant stenosis of the visualized portion of the aorta. Normal variant aortic arch branching pattern with the left vertebral artery arising independently from the aortic arch. The visualized proximal subclavian arteries are widely patent. RIGHT CAROTID SYSTEM: Normal without aneurysm, dissection or stenosis. LEFT CAROTID SYSTEM: Normal without aneurysm, dissection or stenosis. VERTEBRAL ARTERIES: Left dominant configuration. Both origins are clearly patent. There is no dissection, occlusion or flow-limiting stenosis to the skull base (V1-V3 segments). CTA HEAD FINDINGS POSTERIOR CIRCULATION: --Vertebral arteries: Normal V4 segments. --Posterior inferior cerebellar arteries (PICA): Patent origins from the vertebral arteries. --Anterior inferior cerebellar arteries (AICA): Patent origins from the basilar artery. --Basilar artery: Normal. --Superior cerebellar arteries: Normal. --Posterior cerebral arteries: Normal.  Both are predominantly supplied by the posterior communicating arteries (p-comm). ANTERIOR CIRCULATION: --Intracranial internal carotid arteries: Normal. --Anterior cerebral arteries (ACA): Normal. Both A1 segments are present. Patent anterior communicating artery (a-comm). --Middle cerebral arteries (MCA): Normal. VENOUS SINUSES: As permitted by contrast timing, patent. ANATOMIC VARIANTS: Fetal origins of both posterior cerebral arteries. Review of the MIP images confirms the above findings. IMPRESSION: Normal CTA of the head and neck. Electronically Signed   By: Ulyses Jarred M.D.   On: 01/30/2020 00:09   CT HEAD WO CONTRAST  Result Date: 01/31/2020 CLINICAL DATA:  Stroke follow-up EXAM: CT HEAD WITHOUT CONTRAST TECHNIQUE: Contiguous axial images were obtained from the base of the skull through the vertex without intravenous contrast. COMPARISON:  Brain MRI 01/30/2020 FINDINGS: Brain: Unchanged size of left cerebellar infarct compared to the earlier brain MRI. Small amount of petechial hemorrhage medially. No midline shift. Mild mass effect on the cerebral aqueduct. No hydrocephalus. Vascular: No hyperdense vessel or unexpected calcification. Skull: Normal. Negative for fracture or focal lesion. Sinuses/Orbits: No acute finding. Other: None. IMPRESSION: 1. Unchanged size of left cerebellar infarct compared to the earlier brain MRI. Small amount of petechial hemorrhage medially. 2. Mild mass effect on the cerebral aqueduct without midline shift or hydrocephalus. Electronically Signed   By: Ulyses Jarred M.D.   On: 01/31/2020 01:33   CT Code Stroke CTA Neck W/WO contrast  Result Date: 01/30/2020 CLINICAL DATA:  Dizziness and nausea EXAM: CT ANGIOGRAPHY HEAD AND NECK TECHNIQUE: Multidetector CT imaging of the head and neck was performed using the standard protocol during bolus administration of intravenous contrast. Multiplanar CT image reconstructions and MIPs were obtained to evaluate the vascular anatomy.  Carotid stenosis measurements (when applicable) are obtained utilizing NASCET criteria, using the distal internal carotid diameter as the denominator. CONTRAST:  123mL OMNIPAQUE IOHEXOL 350 MG/ML SOLN COMPARISON:  None. FINDINGS: CT HEAD FINDINGS Brain: There is no mass, hemorrhage or extra-axial collection. The size and configuration of the ventricles and extra-axial CSF spaces are normal. There is no acute or chronic infarction. The brain parenchyma is normal. Skull: The visualized skull base, calvarium and extracranial soft tissues are normal. Sinuses/Orbits: No fluid levels or advanced mucosal thickening of the visualized paranasal sinuses. No mastoid or middle ear effusion. The orbits are normal. CTA NECK FINDINGS SKELETON: There is no bony spinal canal stenosis. No lytic or blastic lesion. OTHER  NECK: Normal pharynx, larynx and major salivary glands. No cervical lymphadenopathy. Unremarkable thyroid gland. UPPER CHEST: No pneumothorax or pleural effusion. No nodules or masses. AORTIC ARCH: There is no calcific atherosclerosis of the aortic arch. There is no aneurysm, dissection or hemodynamically significant stenosis of the visualized portion of the aorta. Normal variant aortic arch branching pattern with the left vertebral artery arising independently from the aortic arch. The visualized proximal subclavian arteries are widely patent. RIGHT CAROTID SYSTEM: Normal without aneurysm, dissection or stenosis. LEFT CAROTID SYSTEM: Normal without aneurysm, dissection or stenosis. VERTEBRAL ARTERIES: Left dominant configuration. Both origins are clearly patent. There is no dissection, occlusion or flow-limiting stenosis to the skull base (V1-V3 segments). CTA HEAD FINDINGS POSTERIOR CIRCULATION: --Vertebral arteries: Normal V4 segments. --Posterior inferior cerebellar arteries (PICA): Patent origins from the vertebral arteries. --Anterior inferior cerebellar arteries (AICA): Patent origins from the basilar artery.  --Basilar artery: Normal. --Superior cerebellar arteries: Normal. --Posterior cerebral arteries: Normal. Both are predominantly supplied by the posterior communicating arteries (p-comm). ANTERIOR CIRCULATION: --Intracranial internal carotid arteries: Normal. --Anterior cerebral arteries (ACA): Normal. Both A1 segments are present. Patent anterior communicating artery (a-comm). --Middle cerebral arteries (MCA): Normal. VENOUS SINUSES: As permitted by contrast timing, patent. ANATOMIC VARIANTS: Fetal origins of both posterior cerebral arteries. Review of the MIP images confirms the above findings. IMPRESSION: Normal CTA of the head and neck. Electronically Signed   By: Deatra Robinson M.D.   On: 01/30/2020 00:09   MR BRAIN WO CONTRAST  Result Date: 01/30/2020 CLINICAL DATA:  Stroke, follow-up. Additional history provided: Patient reports dizziness, imbalance, diplopia and vomiting, status post tPA EXAM: MRI HEAD WITHOUT CONTRAST TECHNIQUE: Multiplanar, multiecho pulse sequences of the brain and surrounding structures were obtained without intravenous contrast. COMPARISON:  CT angiogram head/neck 01/29/2020, noncontrast head CT 01/29/2020. FINDINGS: Brain: There is a 4 cm focus of restricted diffusion consistent with acute infarct within the superomedial left cerebellar hemisphere. A small focus of acute infarction is also present within the left posterior midbrain. Corresponding T2/FLAIR hyperintensity at these sites. Petechial hemorrhage at site of the left cerebellar infarct. Associated mass effect with partial effacement of the fourth ventricle. No evidence of hydrocephalus at this time. There is no evidence of intracranial mass. No midline shift or extra-axial fluid collection. No significant white matter disease for age. Cerebral volume is normal. Vascular: Flow voids maintained within the proximal large arterial vessels. Skull and upper cervical spine: No focal marrow lesion. Sinuses/Orbits: Paranasal sinus  mucosal thickening greatest within the left sphenoid sinus. Small right mastoid effusion. IMPRESSION: 4 cm acute infarct within the superomedial left cerebellar hemisphere with mild petechial hemorrhage. Associated mass effect with partial effacement of the fourth ventricle. No hydrocephalus on the current exam. A small acute infarct is also present within the left posterior midbrain. Paranasal sinus mucosal thickening. Right mastoid effusion. Electronically Signed   By: Jackey Loge DO   On: 01/30/2020 15:16   VAS [place] TRANSCRANIAL DOPPLER W BUBBLES  Result Date: 01/31/2020  Transcranial Doppler with Bubble Indications: Stroke. Limitations for diagnostic windows: Unable to insonate right transtemporal window. Unable to insonate left transtemporal window. Performing Technologist: Blanch Media RVS  Examination Guidelines: A complete evaluation includes B-mode imaging, spectral Doppler, color Doppler, and power Doppler as needed of all accessible portions of each vessel. Bilateral testing is considered an integral part of a complete examination. Limited examinations for reoccurring indications may be performed as noted.  Summary:  A vascular evaluation was performed. The right carotid siphon was studied. An IV  was inserted into the patient's right forearm . Verbal informed consent was obtained.  No HITS heard at rest. No HITS heard during valsalva. Negative TCd Bubble study *See table(s) above for TCD measurements and observations.  Diagnosing physician: Delia Heady MD Electronically signed by Delia Heady MD on 01/31/2020 at 1:47:34 PM.    Final    ECHOCARDIOGRAM COMPLETE  Result Date: 01/30/2020    ECHOCARDIOGRAM REPORT   Patient Name:   [facility] Date of Exam: 01/30/2020 Medical Rec #:  [id number]   Height:       61.5 in Accession #:    [id number]  Weight:       140.0 lb Date of Birth:  Feb 22, 1961  BSA:          1.633 m Patient Age:    58 years    BP:           172/82 mmHg Patient Gender: F           HR:            73 bpm. Exam Location:  Inpatient Procedure: 2D Echo Indications:    Stroke 434.91 / I163.9  History:        Patient has no prior history of Echocardiogram examinations. No                 prior cardiac history.  Sonographer:    Leeroy Bock Turrentine Referring Phys: [phone] ERIC LINDZEN IMPRESSIONS  1. Left ventricular ejection fraction, by estimation, is 65 to 70%. The left ventricle has normal function. The left ventricle has no regional wall motion abnormalities. Left ventricular diastolic parameters are consistent with Grade I diastolic dysfunction (impaired relaxation). Elevated left atrial pressure.  2. Right ventricular systolic function is normal. The right ventricular size is normal.  3. The mitral valve is normal in structure. Mild mitral valve regurgitation. No evidence of mitral stenosis.  4. The aortic valve is normal in structure. Aortic valve regurgitation is not visualized. No aortic stenosis is present.  5. The inferior vena cava is normal in size with greater than 50% respiratory variability, suggesting right atrial pressure of 3 mmHg. Conclusion(s)/Recommendation(s): No intracardiac source of embolism detected on this transthoracic study. A transesophageal echocardiogram is recommended to exclude cardiac source of embolism if clinically indicated. FINDINGS  Left Ventricle: Left ventricular ejection fraction, by estimation, is 65 to 70%. The left ventricle has normal function. The left ventricle has no regional wall motion abnormalities. The left ventricular internal cavity size was normal in size. There is  no left ventricular hypertrophy. Left ventricular diastolic parameters are consistent with Grade I diastolic dysfunction (impaired relaxation). Elevated left atrial pressure. Right Ventricle: The right ventricular size is normal. No increase in right ventricular wall thickness. Right ventricular systolic function is normal. Left Atrium: Left atrial size was normal in size. Right Atrium: Right  atrial size was normal in size. Pericardium: There is no evidence of pericardial effusion. Mitral Valve: The mitral valve is normal in structure. Normal mobility of the mitral valve leaflets. Mild mitral valve regurgitation. No evidence of mitral valve stenosis. Tricuspid Valve: The tricuspid valve is normal in structure. Tricuspid valve regurgitation is mild . No evidence of tricuspid stenosis. Aortic Valve: The aortic valve is normal in structure. Aortic valve regurgitation is not visualized. No aortic stenosis is present. Pulmonic Valve: The pulmonic valve was normal in structure. Pulmonic valve regurgitation is mild. No evidence of pulmonic stenosis. Aorta: The aortic root is normal in size and structure. Venous: The  inferior vena cava is normal in size with greater than 50% respiratory variability, suggesting right atrial pressure of 3 mmHg. IAS/Shunts: No atrial level shunt detected by color flow Doppler.  LEFT VENTRICLE PLAX 2D LVIDd:         4.15 cm  Diastology LVIDs:         2.50 cm  LV e' lateral:   10.40 cm/s LV PW:         0.95 cm  LV E/e' lateral: 7.7 LV IVS:        0.95 cm  LV e' medial:    5.98 cm/s LVOT diam:     1.70 cm  LV E/e' medial:  13.4 LV SV:         63 LV SV Index:   38 LVOT Area:     2.27 cm  RIGHT VENTRICLE RV S prime:     14.60 cm/s TAPSE (M-mode): 2.6 cm LEFT ATRIUM             Index       RIGHT ATRIUM           Index LA diam:        3.50 cm 2.14 cm/m  RA Area:     14.10 cm LA Vol (A2C):   41.7 ml 25.54 ml/m RA Volume:   32.00 ml  19.60 ml/m LA Vol (A4C):   31.9 ml 19.54 ml/m LA Biplane Vol: 38.0 ml 23.27 ml/m  AORTIC VALVE LVOT Vmax:   121.00 cm/s LVOT Vmean:  90.700 cm/s LVOT VTI:    0.276 m  AORTA Ao Root diam: 2.10 cm MITRAL VALVE MV Area (PHT): 3.63 cm     SHUNTS MV Decel Time: 209 msec     Systemic VTI:  0.28 m MV E velocity: 80.10 cm/s   Systemic Diam: 1.70 cm MV A velocity: 124.00 cm/s MV E/A ratio:  0.65 Tobias Alexander MD Electronically signed by Tobias Alexander MD  Signature Date/Time: 01/30/2020/8:44:35 PM    Final    CT HEAD CODE STROKE WO CONTRAST  Result Date: 01/29/2020 CLINICAL DATA:  Code stroke.  Nausea and dizziness EXAM: CT HEAD WITHOUT CONTRAST TECHNIQUE: Contiguous axial images were obtained from the base of the skull through the vertex without intravenous contrast. COMPARISON:  None. FINDINGS: Brain: There is no mass, hemorrhage or extra-axial collection. The size and configuration of the ventricles and extra-axial CSF spaces are normal. The brain parenchyma is normal, without evidence of acute or chronic infarction. Vascular: No abnormal hyperdensity of the major intracranial arteries or dural venous sinuses. No intracranial atherosclerosis. Skull: The visualized skull base, calvarium and extracranial soft tissues are normal. Small right frontal osteoma. Sinuses/Orbits: No fluid levels or advanced mucosal thickening of the visualized paranasal sinuses. No mastoid or middle ear effusion. The orbits are normal. ASPECTS [facility] Stroke Program Early CT Score) - Ganglionic level infarction (caudate, lentiform nuclei, internal capsule, insula, M1-M3 cortex): 7 - Supraganglionic infarction (M4-M6 cortex): 3 Total score (0-10 with 10 being normal): 10 IMPRESSION: 1. Normal head CT 2. ASPECTS is 10. 3. These results were called by telephone at the time of interpretation on 01/29/2020 at 10:24 pm to provider DAN FLOYD , who verbally acknowledged these results. Electronically Signed   By: Deatra Robinson M.D.   On: 01/29/2020 22:24   VAS [place] LOWER EXTREMITY VENOUS (DVT)  Result Date: 01/31/2020  Lower Venous DVTStudy Indications: Stroke.  Comparison Study: no prior Performing Technologist: Blanch Media RVS  Examination Guidelines: A complete evaluation includes B-mode  imaging, spectral Doppler, color Doppler, and power Doppler as needed of all accessible portions of each vessel. Bilateral testing is considered an integral part of a complete examination. Limited  examinations for reoccurring indications may be performed as noted. The reflux portion of the exam is performed with the patient in reverse Trendelenburg.  +---------+---------------+---------+-----------+----------+--------------+ RIGHT    CompressibilityPhasicitySpontaneityPropertiesThrombus Aging +---------+---------------+---------+-----------+----------+--------------+ CFV      Full           Yes      Yes                                 +---------+---------------+---------+-----------+----------+--------------+ SFJ      Full                                                        +---------+---------------+---------+-----------+----------+--------------+ FV Prox  Full                                                        +---------+---------------+---------+-----------+----------+--------------+ FV Mid   Full                                                        +---------+---------------+---------+-----------+----------+--------------+ FV DistalFull                                                        +---------+---------------+---------+-----------+----------+--------------+ PFV      Full                                                        +---------+---------------+---------+-----------+----------+--------------+ POP      Full           Yes      Yes                                 +---------+---------------+---------+-----------+----------+--------------+ PTV      Full                                                        +---------+---------------+---------+-----------+----------+--------------+ PERO     Full                                                        +---------+---------------+---------+-----------+----------+--------------+   +---------+---------------+---------+-----------+----------+--------------+  LEFT     CompressibilityPhasicitySpontaneityPropertiesThrombus Aging  +---------+---------------+---------+-----------+----------+--------------+ CFV      Full           Yes      Yes                                 +---------+---------------+---------+-----------+----------+--------------+ SFJ      Full                                                        +---------+---------------+---------+-----------+----------+--------------+ FV Prox  Full                                                        +---------+---------------+---------+-----------+----------+--------------+ FV Mid   Full                                                        +---------+---------------+---------+-----------+----------+--------------+ FV DistalFull                                                        +---------+---------------+---------+-----------+----------+--------------+ PFV      Full                                                        +---------+---------------+---------+-----------+----------+--------------+ POP      Full           Yes      Yes                                 +---------+---------------+---------+-----------+----------+--------------+ PTV      Full                                                        +---------+---------------+---------+-----------+----------+--------------+ PERO     Full                                                        +---------+---------------+---------+-----------+----------+--------------+     Summary: BILATERAL: - No evidence of deep vein thrombosis seen in the lower extremities, bilaterally.   *See table(s) above for measurements and observations. Electronically signed by Sherald Hess MD on 01/31/2020 at 4:03:14 PM.    Final  HISTORY OF PRESENT ILLNESS (from Dr Shelbie Hutching H&P on 01/30/2020) Dellia Donnelly is a 59 y.o. female presenting in transfer from [facility] after receiving tPA there for a possible stroke. Her presenting symptoms were dizziness, imbalance,  diplopia and vomiting. CT head at [facility] was negative. She was evaluated by Teleneurology and IV tPA was administered. CTA was then obtained, revealing no LVO. She was then transferred to [facility] for post-tPA management as an admission to the ICU under the Neurology service.   The Teleneurologist's note has been reviewed: "59yo woman w/no significant PMH p/w dizziness, imbalance, diplopia, vomiting on 01/29/20. She was eating pasta when she suddenly developed room-spinning dizziness and then vomited 3 times. She had unsteady gait on the way to the bathroom. She also briefly saw 3-4 of the same object but that has resolved. She denies weakness, numbness, speech changes, headache, chest pain, hearing loss, tinnitus. She currently feels nausea and lightheadedness, and continues to dry heave. NIHSS 0, neuro exam shows unsteady gait/gait ataxia. CTH has no acute findings. The patient's unsteady gait is new, acute, mild but disabling. Considering the possible disabling effect of this deficit on daily living, the benefits of alteplase outweigh the risks in this setting of a low NIHSS in accordance with AHA stroke guidelines. NIHSS is also known to underestimate posterior circulation strokes which this presentation is consistent with. After no contraindications were identified, I discussed the indication, side effects and risk/benefit of alteplase compared to antiplatelet therapy with the patient and her family who accepted treatment. CTA Head/Neck will be followed up to rule out large vessel occlusion, specifically in the basilar. Presentation is concerning for acute brainstem ischemic stroke based on history and exam, stroke workup with post alteplase protocol advised."  HOSPITAL COURSE Ms. Rhena Glace is a 59 y.o. female  with history of hypertension presenting from [facility] center s/p IVtPA with acute stroke-like symptoms of dizziness, diplopia, vomiting and imbalance.   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    CTA head & neck No LVO or stensois  MRI left superior cerebellar infarct   2D Echo - normal ejection fraction.  No cardiac source of embolism.  Transcranial Doppler bubble study - 01/31/20 - Negative   LDL 138  HgbA1c 6.0  Hypercoagulable work-up negative so far  Bilateral Lower Extremity Venous Dopplers - no evidence of DVT  Consider outpatient TEE and loop recorder, will arrange.  Diet vegetarian Room service appropriate? Yes; Fluid consistency: Thin  No antithrombotics prior to admission - now on ASA 81 and Plavix 75 DAPT for 3 weeks and then aspirin alone.  Therapy recommendations:  Outpatient PT and OT  Disposition:  discharge to home  Hypertension  Home meds amlodipine 5  BP stable  Long-term BP goal normotensive  Hyperlipidemia  Home meds:none  LDL 138, goal < 70  Add Lipitor  PO daily  Continue statin at discharge  Other Stroke Risk Factors  None  Other Problems  Hypokalemia - 3.1->3.3 -> supplemented   Elevated HGBA1C  6.0 - outpatient f/u    DISCHARGE EXAM Vitals:   01/31/20 1132 01/31/20 1651 01/31/20 2236 02/01/20 0802  BP:  (!) 149/83 (!) 158/90 (!) 156/89  Pulse:  85 82 71  Resp: (!) (!) 24  Temp:  98.7 F (37.1 C) 98.4 F (36.9 C) 97.6 F (36.4 C)  TempSrc:   Oral Oral  SpO2:  100% 98% 93%  Weight:  Height:       PHYSICAL EXAM Pleasant middle-aged [place] lady not in distress GENERAL- no acute distress HEENT- [place]/AT Lungs -Respirations unlabored Abdomen -Nondistended Extremities -No edema  Neurologic Examination: Mental Status:  Alert,fullyoriented, thought content appropriate. Speech fluent without evidence of aphasia. Able to follow allcommands without difficulty. Cranial Nerves: II: Visual fields intact. PERRL. III,IV, ZO:[id number]:Noptosis. EOMI. No nystagmus. V,VII:No facial droop. Temp sensation equal  bilaterally VIII: hearingintact to voice. IX,X:Palate rises symmetrically UJ:[id number]:Symmetric XII: midline tongue extension  Motor: Right :Upper extremity 5/5Left: Upper extremity 5/5 Lower extremity 5/5Lower extremity 5/5 Sensory:Tempand light touch intact x 4 Deep Tendon Reflexes: 2+ bilateral upper and lower extremities. Plantars: Right: downgoingLeft: downgoing Cerebellar: Mild left finger-to-nose and knee to heel incoordination. Gait: Mild ataxia and leaning to the left   DISCHARGE INSTRUCTIONS TO PATIENT 1. Gradually increase your activity as tolerated 2. Outpatient Physical and Occupational therapy will be scheduled 3. Medication for stroke prevention - Aspirin 81 mg daily with Plavix 75 mg daily for 3 weeks. After three weeks take aspirin 81 mg every day without the Plavix. 4. Avoid sugar and sweets. (blood glucose high) 5. Outpatient TEE and loop recorder. Cardiology will contact you. 6. Have your potassium checked at your next office visit.  DISCHARGE DIET   Diet - vegetarian (avoid sweets)  DISCHARGE PLAN  Disposition:  Discharge to home  aspirin 81 mg daily and clopidogrel 75 mg daily for three weeks for secondary stroke prevention. After 3 weeks stop Plavix and take aspirin 81 mg every day.  Ongoing risk factor control by Primary Care Physician at time of discharge  Follow-up Donato SchultzLowne Chase, Yvonne R, DO in 2 weeks.  Follow-up in [facility] in 4 weeks, office to schedule an appointment.   Outpatient TEE and Loop - Cardiology to contact patient.  40 minutes were spent preparing discharge.  Marvel PlanJindong Nicholous Girgenti, MD PhD Stroke Neurology 02/01/2020 9:11 PM

## 2020-02-01 NOTE — Progress Notes (Signed)
Discharge instructions reviewed with patient and spouse, understanding was verbalized by both. IV and telemetry monitor removed prior to discharge.

## 2020-02-01 NOTE — Discharge Instructions (Signed)
1. Gradually increase your activity as tolerated 2. Outpatient Physical and Occupational therapy will be scheduled 3. Medication for stroke prevention - Aspirin 81 mg daily with Plavix 75 mg daily for 3 weeks. After three weeks take aspirin 81 mg every day without the Plavix. 4. Avoid sugar and sweets. (blood glucose high) 5. Outpatient TEE and loop recorder. Cardiology will contact you. 6. Have your potassium checked at your next office visit.

## 2020-02-03 ENCOUNTER — Telehealth: Payer: Self-pay | Admitting: *Deleted

## 2020-02-03 NOTE — Telephone Encounter (Signed)
Transition Care Management Follow-up Telephone Call   Date discharged? 02/01/20   How have you been since you were released from the hospital? Doing well   Do you understand why you were in the hospital? yes   Do you understand the discharge instructions? yes   Where were you discharged to? Home with son    Items Reviewed:  Medications reviewed: "I understand all of my meds. I have no questions"  Allergies reviewed: no new allergies per pt.  Dietary changes reviewed: yes  Referrals reviewed: yes   Functional Questionnaire:   Activities of Daily Living (ADLs):   She states they are independent in the following: ambulation, bathing and hygiene, feeding, continence, grooming, toileting and dressing.  Using walker. States they require assistance with the following: na   Any transportation issues/concerns?: no   Any patient concerns? no   Confirmed importance and date/time of follow-up visits scheduled yes  Pt states she wants to call back to schedule f/u appt after she talks to cardiology.  Confirmed with patient if condition begins to worsen call PCP or go to the ER.  Patient was given the office number and encouraged to call back with question or concerns.  : yes

## 2020-02-04 ENCOUNTER — Ambulatory Visit: Payer: No Typology Code available for payment source | Admitting: Family Medicine

## 2020-02-04 ENCOUNTER — Telehealth: Payer: Self-pay | Admitting: Neurology

## 2020-02-04 ENCOUNTER — Encounter: Payer: Self-pay | Admitting: Family Medicine

## 2020-02-04 ENCOUNTER — Other Ambulatory Visit: Payer: Self-pay

## 2020-02-04 ENCOUNTER — Telehealth: Payer: Self-pay | Admitting: Family Medicine

## 2020-02-04 VITALS — BP 140/90 | HR 115 | Temp 97.0°F | Resp 18 | Ht 61.5 in | Wt 130.4 lb

## 2020-02-04 DIAGNOSIS — I69398 Other sequelae of cerebral infarction: Secondary | ICD-10-CM | POA: Diagnosis not present

## 2020-02-04 DIAGNOSIS — R269 Unspecified abnormalities of gait and mobility: Secondary | ICD-10-CM | POA: Diagnosis not present

## 2020-02-04 DIAGNOSIS — I10 Essential (primary) hypertension: Secondary | ICD-10-CM

## 2020-02-04 DIAGNOSIS — I6350 Cerebral infarction due to unspecified occlusion or stenosis of unspecified cerebral artery: Secondary | ICD-10-CM

## 2020-02-04 MED ORDER — LOSARTAN POTASSIUM 50 MG PO TABS: 50.0000 mg | ORAL_TABLET | Freq: Every day | ORAL | 3 refills | Status: DC

## 2020-02-04 NOTE — Progress Notes (Signed)
Patient ID: Judy Gutierrez, female    DOB: Mar 09, 1961  Age: 59 y.o. 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    Mri -- L superior cerebellar infarct  Review of Systems  Constitutional: Negative for appetite change, diaphoresis, fatigue and unexpected weight change.  Eyes: Negative for pain, redness and visual disturbance.  Respiratory: Negative for cough, chest tightness, shortness of breath and wheezing.   Cardiovascular: Negative for chest pain, palpitations and leg swelling.  Endocrine: Negative for cold intolerance, heat intolerance, polydipsia, polyphagia and polyuria.  Genitourinary: Negative for difficulty urinating, dysuria and frequency.  Musculoskeletal: Positive for gait problem.  Neurological: Positive for light-headedness. Negative for dizziness, numbness and headaches.    History Past Medical History:  Diagnosis Date  . Disturbance of skin sensation   . Myalgia and myositis, unspecified   . Other B-complex deficiencies   . 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 Unspecified essential hypertension     She has a past surgical history that includes Cesarean section (1992).   Her family history includes Diabetes in her father; Diabetes Mellitus II in her sister; Hepatitis in her father; Pulmonary embolism in her mother.She reports that she has never smoked. She has never used smokeless tobacco. She reports that she does not drink alcohol or use drugs.  Current Outpatient Medications on File Prior to Visit  Medication Sig Dispense Refill  . amLODipine (NORVASC) 5 MG tablet Take 1 tablet (5 mg total) by mouth daily. 30 tablet 1  . aspirin EC 81 MG EC tablet Take 1 tablet (81 mg total) by mouth daily.      Marland Kitchen atorvastatin (LIPITOR) 40 MG tablet Take 1 tablet (40 mg total) by mouth daily. 30 tablet 1  . clopidogrel (PLAVIX) 75 MG tablet Take 1 tablet (75 mg total) by mouth daily for 20 days. 20 tablet 0   No current facility-administered medications on file prior to visit.     Objective:  Objective  Physical Exam Nursing note reviewed.  Constitutional:      Appearance: She is well-developed.  HENT:     Head: Normocephalic and atraumatic.  Eyes:     Conjunctiva/sclera: Conjunctivae normal.  Neck:     Thyroid: No thyromegaly.     Vascular: No carotid bruit or JVD.  Cardiovascular:     Rate and Rhythm: Normal rate and regular rhythm.     Heart sounds: Normal heart sounds. No murmur.  Pulmonary:     Effort: Pulmonary effort is normal. No respiratory distress.     Breath sounds: Normal breath sounds. No wheezing or rales.  Chest:     Chest wall: No tenderness.  Musculoskeletal:     Cervical back: Normal range of motion and neck supple.  Neurological:     Mental Status: She is alert and oriented to person, place, and time.     Comments: Pt in wheelchair due to still having some dizziness and unsteady gait     BP 140/90 (BP Location: Right Arm, Patient Position: Sitting, Cuff Size: Normal)   Pulse (!) 115   Temp (!) 97 F (36.1 C) (Temporal)   Resp 18   Ht 5' 1.5" (1.562 m)   Wt 130 lb 6.4 oz (59.1 kg)  LMP 12/08/2010   SpO2 98%   BMI 24.24 kg/m  Wt Readings from Last 3 Encounters:  02/04/20 130 lb 6.4 oz (59.1 kg)  01/31/20 138 lb 10.7 oz (62.9 kg)  06/28/18 139 lb 3.2 oz (63.1 kg)     Lab Results  Component Value Date   WBC 6.2 02/01/2020   HGB 12.4 02/01/2020   HCT 39.5 02/01/2020   PLT 287 02/01/2020   GLUCOSE 107 (H) 02/01/2020   CHOL 202 (H) 01/30/2020   TRIG 27 01/30/2020   HDL 59 01/30/2020   LDLCALC 138 (H) 01/30/2020   ALT 29 01/29/2020   AST 42 (H) 01/29/2020   NA 139 02/01/2020   K 3.3 (L) 02/01/2020   CL 106 02/01/2020   CREATININE 0.65  02/01/2020   BUN 15 02/01/2020   CO2 25 02/01/2020   TSH 3.20 06/28/2018   INR 1.0 01/29/2020   HGBA1C 6.0 (H) 01/30/2020    CT Code Stroke CTA Head W/WO contrast  Result Date: 01/30/2020 CLINICAL DATA:  Dizziness and nausea EXAM: CT ANGIOGRAPHY HEAD AND NECK TECHNIQUE: Multidetector CT imaging of the head and neck was performed using the standard protocol during bolus administration of intravenous contrast. Multiplanar CT image reconstructions and MIPs were obtained to evaluate the vascular anatomy. Carotid stenosis measurements (when applicable) are obtained utilizing NASCET criteria, using the distal internal carotid diameter as the denominator. CONTRAST:  OMNIPAQUE IOHEXOL 350 MG/ML SOLN COMPARISON:  None. FINDINGS: CT HEAD FINDINGS Brain: There is no mass, hemorrhage or extra-axial collection. The size and configuration of the ventricles and extra-axial CSF spaces are normal. There is no acute or chronic infarction. The brain parenchyma is normal. Skull: The visualized skull base, calvarium and extracranial soft tissues are normal. Sinuses/Orbits: No fluid levels or advanced mucosal thickening of the visualized paranasal sinuses. No mastoid or middle ear effusion. The orbits are normal. CTA NECK FINDINGS SKELETON: There is no bony spinal canal stenosis. No lytic or blastic lesion. OTHER NECK: Normal pharynx, larynx and major salivary glands. No cervical lymphadenopathy. Unremarkable thyroid gland. UPPER CHEST: No pneumothorax or pleural effusion. No nodules or masses. AORTIC ARCH: There is no calcific atherosclerosis of the aortic arch. There is no aneurysm, dissection or hemodynamically significant stenosis of the visualized portion of the aorta. Normal variant aortic arch branching pattern with the left vertebral artery arising independently from the aortic arch. The visualized proximal subclavian arteries are widely patent. RIGHT CAROTID SYSTEM: Normal without aneurysm, dissection or  stenosis. LEFT CAROTID SYSTEM: Normal without aneurysm, dissection or stenosis. VERTEBRAL ARTERIES: Left dominant configuration. Both origins are clearly patent. There is no dissection, occlusion or flow-limiting stenosis to the skull base (V1-V3 segments). CTA HEAD FINDINGS POSTERIOR CIRCULATION: --Vertebral arteries: Normal V4 segments. --Posterior inferior cerebellar arteries (PICA): Patent origins from the vertebral arteries. --Anterior inferior cerebellar arteries (AICA): Patent origins from the basilar artery. --Basilar artery: Normal. --Superior cerebellar arteries: Normal. --Posterior cerebral arteries: Normal. Both are predominantly supplied by the posterior communicating arteries (p-comm). ANTERIOR CIRCULATION: --Intracranial internal carotid arteries: Normal. --Anterior cerebral arteries (ACA): Normal. Both A1 segments are present. Patent anterior communicating artery (a-comm). --Middle cerebral arteries (MCA): Normal. VENOUS SINUSES: As permitted by contrast timing, patent. ANATOMIC VARIANTS: Fetal origins of both posterior cerebral arteries. Review of the MIP images confirms the above findings. IMPRESSION: Normal CTA of the head and neck. Electronically Signed   By: Deatra Robinson M.D.   On: 01/30/2020 00:09   CT Code Stroke CTA Neck W/WO contrast  Result Date: 01/30/2020  CLINICAL DATA:  Dizziness and nausea EXAM: CT ANGIOGRAPHY HEAD AND NECK TECHNIQUE: Multidetector CT imaging of the head and neck was performed using the standard protocol during bolus administration of intravenous contrast. Multiplanar CT image reconstructions and MIPs were obtained to evaluate the vascular anatomy. Carotid stenosis measurements (when applicable) are obtained utilizing NASCET criteria, using the distal internal carotid diameter as the denominator. CONTRAST:  OMNIPAQUE IOHEXOL 350 MG/ML SOLN COMPARISON:  None. FINDINGS: CT HEAD FINDINGS Brain: There is no mass, hemorrhage or extra-axial collection. The size and  configuration of the ventricles and extra-axial CSF spaces are normal. There is no acute or chronic infarction. The brain parenchyma is normal. Skull: The visualized skull base, calvarium and extracranial soft tissues are normal. Sinuses/Orbits: No fluid levels or advanced mucosal thickening of the visualized paranasal sinuses. No mastoid or middle ear effusion. The orbits are normal. CTA NECK FINDINGS SKELETON: There is no bony spinal canal stenosis. No lytic or blastic lesion. OTHER NECK: Normal pharynx, larynx and major salivary glands. No cervical lymphadenopathy. Unremarkable thyroid gland. UPPER CHEST: No pneumothorax or pleural effusion. No nodules or masses. AORTIC ARCH: There is no calcific atherosclerosis of the aortic arch. There is no aneurysm, dissection or hemodynamically significant stenosis of the visualized portion of the aorta. Normal variant aortic arch branching pattern with the left vertebral artery arising independently from the aortic arch. The visualized proximal subclavian arteries are widely patent. RIGHT CAROTID SYSTEM: Normal without aneurysm, dissection or stenosis. LEFT CAROTID SYSTEM: Normal without aneurysm, dissection or stenosis. VERTEBRAL ARTERIES: Left dominant configuration. Both origins are clearly patent. There is no dissection, occlusion or flow-limiting stenosis to the skull base (V1-V3 segments). CTA HEAD FINDINGS POSTERIOR CIRCULATION: --Vertebral arteries: Normal V4 segments. --Posterior inferior cerebellar arteries (PICA): Patent origins from the vertebral arteries. --Anterior inferior cerebellar arteries (AICA): Patent origins from the basilar artery. --Basilar artery: Normal. --Superior cerebellar arteries: Normal. --Posterior cerebral arteries: Normal. Both are predominantly supplied by the posterior communicating arteries (p-comm). ANTERIOR CIRCULATION: --Intracranial internal carotid arteries: Normal. --Anterior cerebral arteries (ACA): Normal. Both A1 segments are  present. Patent anterior communicating artery (a-comm). --Middle cerebral arteries (MCA): Normal. VENOUS SINUSES: As permitted by contrast timing, patent. ANATOMIC VARIANTS: Fetal origins of both posterior cerebral arteries. Review of the MIP images confirms the above findings. IMPRESSION: Normal CTA of the head and neck. Electronically Signed   By: Deatra Robinson M.D.   On: 01/30/2020 00:09   MR BRAIN WO CONTRAST  Result Date: 01/30/2020 CLINICAL DATA:  Stroke, follow-up. Additional history provided: Patient reports dizziness, imbalance, diplopia and vomiting, status post tPA EXAM: MRI HEAD WITHOUT CONTRAST TECHNIQUE: Multiplanar, multiecho pulse sequences of the brain and surrounding structures were obtained without intravenous contrast. COMPARISON:  CT angiogram head/neck 01/29/2020, noncontrast head CT 01/29/2020. FINDINGS: Brain: There is a 4 cm focus of restricted diffusion consistent with acute infarct within the superomedial left cerebellar hemisphere. A small focus of acute infarction is also present within the left posterior midbrain. Corresponding T2/FLAIR hyperintensity at these sites. Petechial hemorrhage at site of the left cerebellar infarct. Associated mass effect with partial effacement of the fourth ventricle. No evidence of hydrocephalus at this time. There is no evidence of intracranial mass. No midline shift or extra-axial fluid collection. No significant white matter disease for age. Cerebral volume is normal. Vascular: Flow voids maintained within the proximal large arterial vessels. Skull and upper cervical spine: No focal marrow lesion. Sinuses/Orbits: Paranasal sinus mucosal thickening greatest within the left sphenoid sinus. Small right mastoid effusion.  IMPRESSION: 4 cm acute infarct within the superomedial left cerebellar hemisphere with mild petechial hemorrhage. Associated mass effect with partial effacement of the fourth ventricle. No hydrocephalus on the current exam. A small  acute infarct is also present within the left posterior midbrain. Paranasal sinus mucosal thickening. Right mastoid effusion. Electronically Signed   By: Jackey LogeKyle  Golden DO   On: 01/30/2020 15:16   ECHOCARDIOGRAM COMPLETE  Result Date: 01/30/2020    ECHOCARDIOGRAM REPORT   Patient Name:   Judy Gutierrez Date of Exam: 01/30/2020 Medical Rec #:  [id number]   Height:       61.5 in Accession #:    [id number]  Weight:       140.0 lb Date of Birth:  25-Jul-1961  BSA:          1.633 m Patient Age:    58 years    BP:           172/82 mmHg Patient Gender: F           HR:           73 bpm. Exam Location:  Inpatient Procedure: 2D Echo Indications:    Stroke 434.91 / I163.9  History:        Patient has no prior history of Echocardiogram examinations. No                 prior cardiac history.  Sonographer:    Leeroy Bockhelsea Turrentine Referring Phys: [phone] ERIC LINDZEN IMPRESSIONS  1. Left ventricular ejection fraction, by estimation, is 65 to 70%. The left ventricle has normal function. The left ventricle has no regional wall motion abnormalities. Left ventricular diastolic parameters are consistent with Grade I diastolic dysfunction (impaired relaxation). Elevated left atrial pressure.  2. Right ventricular systolic function is normal. The right ventricular size is normal.  3. The mitral valve is normal in structure. Mild mitral valve regurgitation. No evidence of mitral stenosis.  4. The aortic valve is normal in structure. Aortic valve regurgitation is not visualized. No aortic stenosis is present.  5. The inferior vena cava is normal in size with greater than 50% respiratory variability, suggesting right atrial pressure of 3 mmHg. Conclusion(s)/Recommendation(s): No intracardiac source of embolism detected on this transthoracic study. A transesophageal echocardiogram is recommended to exclude cardiac source of embolism if clinically indicated. FINDINGS  Left Ventricle: Left ventricular ejection fraction, by estimation, is 65 to 70%.  The left ventricle has normal function. The left ventricle has no regional wall motion abnormalities. The left ventricular internal cavity size was normal in size. There is  no left ventricular hypertrophy. Left ventricular diastolic parameters are consistent with Grade I diastolic dysfunction (impaired relaxation). Elevated left atrial pressure. Right Ventricle: The right ventricular size is normal. No increase in right ventricular wall thickness. Right ventricular systolic function is normal. Left Atrium: Left atrial size was normal in size. Right Atrium: Right atrial size was normal in size. Pericardium: There is no evidence of pericardial effusion. Mitral Valve: The mitral valve is normal in structure. Normal mobility of the mitral valve leaflets. Mild mitral valve regurgitation. No evidence of mitral valve stenosis. Tricuspid Valve: The tricuspid valve is normal in structure. Tricuspid valve regurgitation is mild . No evidence of tricuspid stenosis. Aortic Valve: The aortic valve is normal in structure. Aortic valve regurgitation is not visualized. No aortic stenosis is present. Pulmonic Valve: The pulmonic valve was normal in structure. Pulmonic valve regurgitation is mild. No evidence of pulmonic stenosis. Aorta: The aortic root is normal  in size and structure. Venous: The inferior vena cava is normal in size with greater than 50% respiratory variability, suggesting right atrial pressure of 3 mmHg. IAS/Shunts: No atrial level shunt detected by color flow Doppler.  LEFT VENTRICLE PLAX 2D LVIDd:         4.15 cm  Diastology LVIDs:         2.50 cm  LV e' lateral:   10.40 cm/s LV PW:         0.95 cm  LV E/e' lateral: 7.7 LV IVS:        0.95 cm  LV e' medial:    5.98 cm/s LVOT diam:     1.70 cm  LV E/e' medial:  13.4 LV SV:         63 LV SV Index:   38 LVOT Area:     2.27 cm  RIGHT VENTRICLE RV S prime:     14.60 cm/s TAPSE (M-mode): 2.6 cm LEFT ATRIUM             Index       RIGHT ATRIUM           Index LA diam:         3.50 cm 2.14 cm/m  RA Area:     14.10 cm LA Vol (A2C):   41.7 ml 25.54 ml/m RA Volume:   32.00 ml  19.60 ml/m LA Vol (A4C):   31.9 ml 19.54 ml/m LA Biplane Vol: 38.0 ml 23.27 ml/m  AORTIC VALVE LVOT Vmax:   121.00 cm/s LVOT Vmean:  90.700 cm/s LVOT VTI:    0.276 m  AORTA Ao Root diam: 2.10 cm MITRAL VALVE MV Area (PHT): 3.63 cm     SHUNTS MV Decel Time: 209 msec     Systemic VTI:  0.28 m MV E velocity: 80.10 cm/s   Systemic Diam: 1.70 cm MV A velocity: 124.00 cm/s MV E/A ratio:  0.65 Ena Dawley MD Electronically signed by Ena Dawley MD Signature Date/Time: 01/30/2020/8:44:35 PM    Final    CT HEAD CODE STROKE WO CONTRAST  Result Date: 01/29/2020 CLINICAL DATA:  Code stroke.  Nausea and dizziness EXAM: CT HEAD WITHOUT CONTRAST TECHNIQUE: Contiguous axial images were obtained from the base of the skull through the vertex without intravenous contrast. COMPARISON:  None. FINDINGS: Brain: There is no mass, hemorrhage or extra-axial collection. The size and configuration of the ventricles and extra-axial CSF spaces are normal. The brain parenchyma is normal, without evidence of acute or chronic infarction. Vascular: No abnormal hyperdensity of the major intracranial arteries or dural venous sinuses. No intracranial atherosclerosis. Skull: The visualized skull base, calvarium and extracranial soft tissues are normal. Small right frontal osteoma. Sinuses/Orbits: No fluid levels or advanced mucosal thickening of the visualized paranasal sinuses. No mastoid or middle ear effusion. The orbits are normal. ASPECTS [facility] Stroke Program Early CT Score) - Ganglionic level infarction (caudate, lentiform nuclei, internal capsule, insula, M1-M3 cortex): 7 - Supraganglionic infarction (M4-M6 cortex): 3 Total score (0-10 with 10 being normal): 10 IMPRESSION: 1. Normal head CT 2. ASPECTS is 10. 3. These results were called by telephone at the time of interpretation on 01/29/2020 at 10:24 pm to provider DAN  FLOYD , who verbally acknowledged these results. Electronically Signed   By: Ulyses Jarred M.D.   On: 01/29/2020 22:24     Assessment & Plan:  Plan  I am having Edman Circle start on losartan. I am also having her maintain her amLODipine, aspirin, atorvastatin, and  clopidogrel.  Meds ordered this encounter  Medications  . losartan (COZAAR) 50 MG tablet    Sig: Take 1 tablet (50 mg total) by mouth daily.    Dispense:  30 tablet    Refill:  3    Problem List Items Addressed This Visit      Unprioritized   Essential hypertension - Primary    con't norvasc 5 mg  Add losartan 50 mg daily and f/u 3 weeks or sooner prn       Relevant Medications   losartan (COZAAR) 50 MG tablet   Other Relevant Orders   Basic metabolic panel   Posterior circulation stroke (HCC)    con't plavix for 2 weeks and aspirin  After 3 weeks -- take aspirin daily F/u neuro Pt /ot per home health      Relevant Medications   losartan (COZAAR) 50 MG tablet      Follow-up: Return in about 3 weeks (around 02/25/2020) for hypertension.  [name], DO

## 2020-02-04 NOTE — Telephone Encounter (Signed)
Pt recently discharged from Hospital on Saturday from a Stroke  She states as of  11am she BP WAS 179/102

## 2020-02-04 NOTE — Telephone Encounter (Signed)
Okay to change to home physical and occupational therapy

## 2020-02-04 NOTE — Telephone Encounter (Signed)
I called pt about needing therapy. I stated it was recommended she get outpatient therapy. Pt stated she cannot drive and would need inpatient therapy at her house. I stated message will be sent to Dr. [name] for review.

## 2020-02-04 NOTE — Telephone Encounter (Signed)
Pt requested a CB to discuss if Dr.Sethi believes pt needs PT

## 2020-02-04 NOTE — Patient Instructions (Signed)
Stroke Prevention Some medical conditions and behaviors are associated with a higher chance of having a stroke. You can help prevent a stroke by making nutrition, lifestyle, and other changes, including managing any medical conditions you may have. What nutrition changes can be made?   Eat healthy foods. You can do this by: ? Choosing foods high in fiber, such as fresh fruits and vegetables and whole grains. ? Eating at least 5 or more servings of fruits and vegetables a day. Try to fill half of your plate at each meal with fruits and vegetables. ? Choosing lean protein foods, such as lean cuts of meat, poultry without skin, fish, tofu, beans, and nuts. ? Eating low-fat dairy products. ? Avoiding foods that are high in salt (sodium). This can help lower blood pressure. ? Avoiding foods that have saturated fat, trans fat, and cholesterol. This can help prevent high cholesterol. ? Avoiding processed and premade foods.  Follow your health care provider's specific guidelines for losing weight, controlling high blood pressure (hypertension), lowering high cholesterol, and managing diabetes. These may include: ? Reducing your daily calorie intake. ? Limiting your daily sodium intake to 1,500 milligrams (mg). ? Using only healthy fats for cooking, such as olive oil, canola oil, or sunflower oil. ? Counting your daily carbohydrate intake. What lifestyle changes can be made?  Maintain a healthy weight. Talk to your health care provider about your ideal weight.  Get at least 30 minutes of moderate physical activity at least 5 days a week. Moderate activity includes brisk walking, biking, and swimming.  Do not use any products that contain nicotine or tobacco, such as cigarettes and e-cigarettes. If you need help quitting, ask your health care provider. It may also be helpful to avoid exposure to secondhand smoke.  Limit alcohol intake to no more than 1 drink a day for nonpregnant women and 2 drinks  a day for men. One drink equals 12 oz of beer, 5 oz of wine, or 1 oz of hard liquor.  Stop any illegal drug use.  Avoid taking birth control pills. Talk to your health care provider about the risks of taking birth control pills if: ? You are over 35 years old. ? You smoke. ? You get migraines. ? You have ever had a blood clot. What other changes can be made?  Manage your cholesterol levels. ? Eating a healthy diet is important for preventing high cholesterol. If cholesterol cannot be managed through diet alone, you may also need to take medicines. ? Take any prescribed medicines to control your cholesterol as told by your health care provider.  Manage your diabetes. ? Eating a healthy diet and exercising regularly are important parts of managing your blood sugar. If your blood sugar cannot be managed through diet and exercise, you may need to take medicines. ? Take any prescribed medicines to control your diabetes as told by your health care provider.  Control your hypertension. ? To reduce your risk of stroke, try to keep your blood pressure below 130/80. ? Eating a healthy diet and exercising regularly are an important part of controlling your blood pressure. If your blood pressure cannot be managed through diet and exercise, you may need to take medicines. ? Take any prescribed medicines to control hypertension as told by your health care provider. ? Ask your health care provider if you should monitor your blood pressure at home. ? Have your blood pressure checked every year, even if your blood pressure is normal. Blood pressure increases   with age and some medical conditions.  Get evaluated for sleep disorders (sleep apnea). Talk to your health care provider about getting a sleep evaluation if you snore a lot or have excessive sleepiness.  Take over-the-counter and prescription medicines only as told by your health care provider. Aspirin or blood thinners (antiplatelets or  anticoagulants) may be recommended to reduce your risk of forming blood clots that can lead to stroke.  Make sure that any other medical conditions you have, such as atrial fibrillation or atherosclerosis, are managed. What are the warning signs of a stroke? The warning signs of a stroke can be easily remembered as BEFAST.  B is for balance. Signs include: ? Dizziness. ? Loss of balance or coordination. ? Sudden trouble walking.  E is for eyes. Signs include: ? A sudden change in vision. ? Trouble seeing.  F is for face. Signs include: ? Sudden weakness or numbness of the face. ? The face or eyelid drooping to one side.  A is for arms. Signs include: ? Sudden weakness or numbness of the arm, usually on one side of the body.  S is for speech. Signs include: ? Trouble speaking (aphasia). ? Trouble understanding.  T is for time. ? These symptoms may represent a serious problem that is an emergency. Do not wait to see if the symptoms will go away. Get medical help right away. Call your local emergency services (911 in the [place] Do not drive yourself to the hospital.  Other signs of stroke may include: ? A sudden, severe headache with no known cause. ? Nausea or vomiting. ? Seizure. Where to find more information For more information, visit:  American Stroke Association: www.strokeassociation.org  National Stroke Association: www.stroke.org Summary  You can prevent a stroke by eating healthy, exercising, not smoking, limiting alcohol intake, and managing any medical conditions you may have.  Do not use any products that contain nicotine or tobacco, such as cigarettes and e-cigarettes. If you need help quitting, ask your health care provider. It may also be helpful to avoid exposure to secondhand smoke.  Remember BEFAST for warning signs of stroke. Get help right away if you or a loved one has any of these signs. This information is not intended to replace advice given to you  by your health care provider. Make sure you discuss any questions you have with your health care provider. Document Revised: 09/15/2017 Document Reviewed: 11/08/2016 Elsevier Patient Education  2020 Elsevier Inc.  

## 2020-02-05 ENCOUNTER — Other Ambulatory Visit: Payer: Self-pay

## 2020-02-05 DIAGNOSIS — I639 Cerebral infarction, unspecified: Secondary | ICD-10-CM

## 2020-02-05 DIAGNOSIS — I10 Essential (primary) hypertension: Secondary | ICD-10-CM | POA: Insufficient documentation

## 2020-02-05 LAB — BASIC METABOLIC PANEL
BUN: 19 mg/dL (ref 6–23)
CO2: 29 mEq/L (ref 19–32)
Calcium: 9 mg/dL (ref 8.4–10.5)
Chloride: 101 mEq/L (ref 96–112)
Creatinine, Ser: 0.63 mg/dL (ref 0.40–1.20)
GFR: 96.91 mL/min (ref >60.00)
Glucose, Bld: 114 mg/dL — ABNORMAL HIGH (ref 70–99)
Potassium: 4.1 mEq/L (ref 3.5–5.1)
Sodium: 137 mEq/L (ref 135–145)

## 2020-02-05 NOTE — Progress Notes (Signed)
Referral for home health.

## 2020-02-05 NOTE — Telephone Encounter (Signed)
Home health orders put in.

## 2020-02-05 NOTE — Assessment & Plan Note (Addendum)
con't plavix for 2 weeks and aspirin  After 3 weeks -- take aspirin daily F/u neuro Pt /ot per home health

## 2020-02-05 NOTE — Assessment & Plan Note (Signed)
con't norvasc 5 mg  Add losartan 50 mg daily and f/u 3 weeks or sooner prn

## 2020-02-05 NOTE — Telephone Encounter (Signed)
Noted Arma Heading aware.

## 2020-02-06 ENCOUNTER — Telehealth: Payer: Self-pay | Admitting: Neurology

## 2020-02-06 ENCOUNTER — Inpatient Hospital Stay: Payer: No Typology Code available for payment source | Admitting: Family Medicine

## 2020-02-06 NOTE — Telephone Encounter (Signed)
Pt called stating she is having numbness over her right eye on the right side of her face post her ultrasound that was done in the ED and was unsure if that is normal she is concerned it might not be normal and would like to check in on PT orders informed of previous TE but she requested a FU call from RN

## 2020-02-06 NOTE — Telephone Encounter (Signed)
I called and left a message on the patient's answering machine to call back if needed.  I did not think the symptoms are mentioned in his early sign of stroke or any worrisome symptoms.

## 2020-02-06 NOTE — Telephone Encounter (Signed)
The patient called back and I spoke to her she was having some intermittent numbness around the right eyelid I do not think this is related to the transcranial Doppler bubble study that she had or her previous stroke. She voiced understanding. She was advised to keep her scheduled follow-up appointment with me

## 2020-02-10 ENCOUNTER — Telehealth: Payer: Self-pay | Admitting: Neurology

## 2020-02-10 NOTE — Telephone Encounter (Signed)
Pt has called to inquire about the status of her starting physical therapy at home.  Pt was advised of the update as of 04-21 re: what Arma Heading is waiting to hear back on.  Pt will wait and asked that this be processed as soon as possible.

## 2020-02-11 ENCOUNTER — Telehealth: Payer: Self-pay

## 2020-02-11 NOTE — Telephone Encounter (Signed)
Patient son called in to see if he could talk to DR, Laury Axon or the nurse about some concerns about his mom medical condition the patient just recently had a stroke, patient is staying very dizzy and the patient blood pressure is 139/92 Please call the Son Ajay back at [phone] as soon as possible to advise if he needs to make an appointment.

## 2020-02-12 ENCOUNTER — Other Ambulatory Visit: Payer: Self-pay | Admitting: *Deleted

## 2020-02-12 NOTE — Telephone Encounter (Signed)
Spoke with patient regarding call from 02/11/2020.  Patient reports that her son was told he would receive a call back within 15 min of call.  Explained to patient it is not our routine to provide a return call back time.  Patient symptoms have resolved, apologized for any confusion or misunderstanding.

## 2020-02-12 NOTE — Telephone Encounter (Signed)
Spoke with patient. Pt states she was having nausea, not dizziness. Pt states she was told someone would call back in 15 minutes.  Pt states we should have called back sooner and her sxs have resolved. Pt states that next time we should call back sooner.

## 2020-02-12 NOTE — Patient Outreach (Signed)
[facility]) Care Management  02/12/2020  Brycelyn Gambino 10-23-1960 [id number]   Subjective: Telephone call to patient's home / mobile number, spoke with patient, and HIPAA verified.  Discussed [facility] Care Management [employer] Rule EMMI  Stroke [employer] Alert follow up, patient voiced understanding, and is in agreement to brief follow up.  States she is doing well, currently taking a nap, does not remember receiving EMMI automated calls, and has all of her follow up appointments scheduled.   States she had a follow up with primary MD on 02/04/2020 and appointment went well.  Per chart review, patient has a follow up appointment with neurologist on 03/04/2020 and with her primary MD on 02/28/2020.  States neurologist office is working on arranging home health physical therapy, she is willing to pay  for therapy out of pocket,  since insurance company would not approve payment, feels therapy is very important, does not have transportation to outpatient therapy that was originally ordered due to son's work schedule, son works from home, due to [employer] work schedule, and she is currently unable to drive due to recent mild stroke.   RNCM advised will follow up provider's office to verify home health status and call her back with an update.   States she is accessing her [employer]  benefits as needed via member services number on back of card.  Patient states she does not have any education material, EMMI follow up, disease monitoring, transportation, [employer], or pharmacy needs at this time.  States she is very appreciative of the follow up, is in agreement  to receive 1 additional follow up call to assess for further CM needs, and is in agreement to receive [facility] Care Management EMMI follow up calls as needed.     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       Assessment: Received [employer] Rule EMMI Stroke [employer] Alert follow up referral on 02/05/2020.   Red Flag Alert Trigger, Day #1, patient answered yes to the following question: Problems setting up rehab?   EMMI follow up completed and will follow up to assess further care management needs.       Plan: RNCM will follow up with patient's neurologist regarding home health physical therapy status within 3 business days.  RNCM will call patient for telephone outreach attempt, within 7 business days, EMMI follow up, to assess for further CM needs, and proceed with case closure, within 10 business days if no return call.      Tabatha Razzano H. Gardiner Barefoot, BSN, CCM [facility] Care Management [facility] Telephonic CM Phone: [phone] Fax: [phone]

## 2020-02-12 NOTE — Telephone Encounter (Signed)
Called and talked to patient and patient wants me to contact [facility] and we are having a hard time getting her insurance approved for Home health and we are still working on it. Patient wants me to call [facility] and see how much will they charge for 2 days with no insurance . I have reached out to [facility] . Waiting for them to contact me back.

## 2020-02-13 ENCOUNTER — Ambulatory Visit: Payer: Self-pay | Admitting: *Deleted

## 2020-02-13 ENCOUNTER — Other Ambulatory Visit: Payer: Self-pay | Admitting: *Deleted

## 2020-02-13 NOTE — Patient Outreach (Addendum)
[facility]) Care Management  02/13/2020  Judy Gutierrez 1961-05-05 [id number]    Subjective: Telephone call to Dr. Marlis Edelson office, spoke with receptionist, [facility]  [employer] verified, advised of role with [employer], advised spoke with patient on 02/12/2020 for EMMI follow up call, requesting update on status of home health physical therapy, and asked if this RNCM assistance needed with care coordination of the home health services.  Receptionist  states coordinator Annabelle Harman, has spoken with patient on 02/12/2020, and transferred [facility] to [facility] for an update.  Spoke with Annabelle Harman, asked for [facility] to hold for a moment, call placed on hold, and call disconnected.  Telephone call to Dr. Marlis Edelson office, spoke with Misty Stanley, advised of above, transferred to [facility], states she spoke with patient this morning, confirmed that patient has been accepted by [facility] for home health physical therapy services, and services will be initiated soon.   States [facility] was unable to accept patient for services due to staffing and patient is aware. Annabelle Harman states she has sent [facility] a community message to confirm start of care date and will call this RNCM with an update.     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   Patient also has a history of hypertension, Elevated HGBA1C 6.0 - outpatient follow up, Hyperlipidemia, Myalgia, myositis, B-complex deficiencies, and Hypokalemia.       Assessment: Received [employer] Rule EMMI Stroke [employer] Alert follow up referral on 02/05/2020.   Red Flag Alert Trigger, Day #1, patient answered yes to the following question: Problems setting up rehab?   EMMI follow up completed and will follow up to assess further care management needs.       Plan: RNCM will call patient for telephone outreach  attempt, within 7 business days, EMMI follow up, to assess for further CM needs, and proceed with case closure, within 10 business days if no return call, after 3rd unsuccessful outreach call.     Novie Maggio H. Gardiner Barefoot, BSN, CCM [facility] Care Management [facility] Telephonic CM Phone: [phone] Fax: [phone]

## 2020-02-13 NOTE — Telephone Encounter (Signed)
Judy Gutierrez has accepted patient and Patient is aware.

## 2020-02-14 ENCOUNTER — Telehealth: Payer: Self-pay | Admitting: Family Medicine

## 2020-02-14 NOTE — Telephone Encounter (Signed)
Caller: Jamae Tison Call Back # [phone]  Notification of care   Per Joya San     Patient will receive PT services beginning May 3,2021

## 2020-02-14 NOTE — Telephone Encounter (Signed)
noted 

## 2020-02-18 ENCOUNTER — Encounter: Payer: Self-pay | Admitting: *Deleted

## 2020-02-18 ENCOUNTER — Other Ambulatory Visit: Payer: Self-pay | Admitting: *Deleted

## 2020-02-18 NOTE — Patient Outreach (Signed)
[facility]) Care Management  02/18/2020  Judy Gutierrez 1961/06/07 [id number]   Subjective: Received voicemail message from Annabelle Harman at Dr. Marlis Edelson office, states she has confirmed with agency [facility]) and with patient Judy Gutierrez) that patient will be receiving home health physical therapy services.  Telephone call to patient's home / mobile number, spoke with patient, and HIPAA verified.  Patient states she remembers speaking with this RNCM in the past.  Patient states she is doing well, has first home health physical therapy visit on 02/17/2020, able to perform home exercise program without difficulty, and visit went well.   States she has been given parameter of if her blood pressure goes below 90/60 to notify her MD.  Patient states she is aware of signs/ symptoms to report, how to reach provider if needed after hours, when to go to ED, and / or call 911.   States her blood pressure today was 113 /85 and on 02/17/2020 it was 102/72.  States her son or home health agency takes her blood pressure daily.  Patient states she is able to manage some self care and has assistance as needed. Patient voices understanding of medical diagnosis and treatment plan. States she is accessing her [employer] benefits as needed via member services number on back of card.   Discussed Advanced Directives, [employer] system for document scanning, patient voices understanding, patient declined to provide copies of documents, and states her family will handle documents as needed.  Patient states she does not have any education material, EMMI follow up, care coordination, care management, disease monitoring, transportation, community resource, or pharmacy needs at this time.  States she does research on line as needed.  States she is very appreciative of the follow up, is in agreement  to receive 1 additional follow up call to assess for further CM needs, and is in agreement to receive [facility]  Care Management EMMI follow up calls as needed.      Objective:Per KPN (Knowledge Performance Now, point of care tool) and chart review,patient hospitalized 01/29/2020 - 02/01/2020 forLeft cerebellar embolic stroke of cryptogenic etiology,treated with tPA. Patient also has a history of hypertension, Elevated HGBA1C 6.0 - outpatientfollow up,Hyperlipidemia,Myalgia, myositis,B-complex deficiencies, andHypokalemia.      Assessment: Received [employer] Rule EMMI Stroke [employer] Alert follow up referral on 02/05/2020. Red Flag Alert Trigger, Day #1, patient answered yes to the following question: Problems setting up rehab?EMMI follow up completed and will follow up to assess further care management needs.       Plan: RNCM will call patient for telephone outreach attempt, within 21 business days, EMMI follow up, to assess for further CM needs, and proceed with case closure, within 10 business days if no return call, after 3rd unsuccessful outreach call.     Sergio Zawislak H. Gardiner Barefoot, BSN, CCM [facility] Care Management [facility] Telephonic CM Phone: [phone] Fax: [phone]

## 2020-02-21 ENCOUNTER — Institutional Professional Consult (permissible substitution): Payer: No Typology Code available for payment source | Admitting: Internal Medicine

## 2020-02-25 ENCOUNTER — Encounter: Payer: Self-pay | Admitting: Internal Medicine

## 2020-02-25 ENCOUNTER — Ambulatory Visit (INDEPENDENT_AMBULATORY_CARE_PROVIDER_SITE_OTHER): Payer: No Typology Code available for payment source | Admitting: Internal Medicine

## 2020-02-25 ENCOUNTER — Other Ambulatory Visit: Payer: Self-pay

## 2020-02-25 DIAGNOSIS — I639 Cerebral infarction, unspecified: Secondary | ICD-10-CM

## 2020-02-25 NOTE — Patient Instructions (Addendum)
Medication Instructions:  Your physician recommends that you continue on your current medications as directed. Please refer to the Current Medication list given to you today.  Labwork: None ordered.  Testing/Procedures: Your physician has requested that you have a TEE. During a TEE, sound waves are used to create images of your heart. It provides your doctor with information about the size and shape of your heart and how well your heart's chambers and valves are working. In this test, a transducer is attached to the end of a flexible tube that's guided down your throat and into your esophagus (the tube leading from you mouth to your stomach) to get a more detailed image of your heart. You are not awake for the procedure. Please see the instruction sheet given to you today. For further information please visit [email or web address]   Follow-Up:  Based on results of your TEE.  Any Other Special Instructions Will Be Listed Below (If Applicable).  If you need a refill on your cardiac medications before your next appointment, please call your pharmacy.    You are scheduled for a TEE on Mar 05, 2020 with Dr. Bjorn Pippin.  Please arrive at the [facility] (Main Entrance A) at [facility]: [address] [place], [place] [address] at 6:30 AM.  COVID TEST:  Mar 03, 2020 at 2:40 pm--at [address] [place].  Drive thru Statistician after your covid test  DIET: Nothing to eat or drink after midnight except a sip of water with medications (see medication instructions below)  Medication Instructions:  Do NOT take any morning medications  Labs: your lab work will be done at the hospital prior to your procedure - you will need to arrive 1  hours ahead of your procedure  You must have a responsible person to drive you home and stay in the waiting area during your procedure. Failure to do so could result in cancellation.  Bring your insurance cards.  *Special  Note: Every effort is made to have your procedure done on time. Occasionally there are emergencies that occur at the hospital that may cause delays. Please be patient if a delay does occur.    Transesophageal Echocardiogram  Transesophageal echocardiogram (TEE) is a test that uses sound waves (echocardiogram) to produce very clear, detailed images of the heart. TEE is done by passing a flexible tube down the esophagus. The heart is located in front of the esophagus. TEE may be done:  To check how well your heart valves are working.  To check for any abnormal growth or tumor  To look for blood clots  To check for infection of the lining of the heart (endocarditis).  To evaluate the dividing wall (septum) of the heart and check for a hole that did not close after birth (patent foramen ovale or atrial septal defect).  To help diagnose a tear in the wall of the blood vessels (aortic dissection).  To look at the heart shape, size, and function. Any changes can be associated with certain conditions, including heart failure, aneurysm, and coronary heart disease, CAD.  During cardiac valve surgery. This allows the surgeon to assess the valve repair before closing the chest.  During a variety of other cardiac procedures to guide positioning of catheters.  To monitor your heart's response to IV fluids or medicine. TEE is usually not a painful procedure. You may feel the probe press against the back of the throat. The probe does not enter the trachea and  does not affect your breathing. Tell a health care provider about:  Any allergies you have.  All medicines you are taking, including vitamins, herbs, eye drops, creams, and over-the-counter medicines.  Any problems you or family members have had with anesthetic medicines.  Any blood disorders you have.  Any surgeries you have had.  Any medical conditions you have.  Any swallowing difficulties.  Whether you have or have had a blockage  of the esophagus (esophageal obstruction).  Whether you are pregnant or may be pregnant. What are the risks? Generally, this is a safe procedure. However, problems may occur, including:  Damage to other structures or organs.  A tear of the esophagus (esophageal rupture).  Irregular heart beat (arrhythmia).  Hoarse voice or difficulty swallowing.  Bleeding (hemorrhage). What happens before the procedure? Staying hydrated Follow instructions from your health care provider about hydration, which may include:  Up to 3 hours before the procedure - you may continue to drink clear liquids, such as water, clear fruit juice, black coffee, and plain tea. Eating and drinking Follow instructions from your health care provider about eating and drinking, which may include:  8 hours before the procedure - stop eating heavy meals or foods such as meat, fried foods, or fatty foods.  6 hours before the procedure - stop eating light meals or foods, such as toast or cereal.  6 hours before the procedure - stop drinking milk or drinks that contain milk.  3 hours before the procedure - stop drinking clear liquids. General instructions  You will need to remove any dentures or dental retainers.  Plan to have someone take you home from the hospital or clinic.  Plan to have a responsible adult care for you for at least 24 hours after you leave the hospital or clinic. This is important.  Ask your health care provider about: ? Changing or stopping your normal medicines. This is important if you take diabetes medicines or blood thinners. ? Taking over-the-counter medicines, vitamins, herbs, and supplements. ? Taking medicines such as aspirin and ibuprofen. These medicines can thin your blood. Do not take these medicines unless your health care provider tells you to take them. What happens during the procedure?  To reduce your risk of infection: ? Your health care team will wash or sanitize their  hands. ? Hair may be removed from the surgical area. ? Your skin will be washed with soap.  An IV will be inserted into one of your veins.  You will be given one or both of the following: ? A medicine to help you relax (sedative). ? A medicine to be sprayed or gargled in order to numb the back of your throat (local anesthetic).  Your blood pressure, heart rate, and breathing (vital signs) will be monitored during the procedure.  You may be asked to lay on your left side.  A bite block will be placed in your mouth to keep you from biting the tube during the procedure.  The tip of the TEE probe will be placed into the back of your mouth. You will be asked to swallow. This helps to pass the tip of the probe into the esophagus.  Once the tip of the probe is in the correct area, your health care provider will take pictures of the heart.  The probe and bite block will be removed when the procedure is done. The procedure may vary among health care providers and hospitals What happens after the procedure?  Your blood pressure,  heart rate, breathing rate, and blood oxygen level will be monitored until the medicines you were given have worn off.  When you first wake up, your throat may feel slightly sore and will probably still feel numb. This will improve slowly over time. You will not be allowed to eat or drink until the numbness has gone away.  Do not drive for 24 hours if you received a sedative. Summary  Transesophageal echocardiogram (TEE) is a test that uses sound waves (echocardiogram) to produce very clear, detailed images of the heart.  TEE is done by passing a flexible tube down the esophagus.  Generally, this is a safe procedure. However, problems may occur, including damage to other structures or organs, bleeding, irregular heart beat, and a hoarse voice or trouble swallowing.  Tell your health care provider about any medicines and medical conditions you may have, as some  conditions may increase your risk of complications. This information is not intended to replace advice given to you by your health care provider. Make sure you discuss any questions you have with your health care provider. Document Revised: 03/14/2017 Document Reviewed: 12/30/2016 Elsevier Patient Education  [place].

## 2020-02-25 NOTE — Progress Notes (Signed)
HPI Judy Gutierrez is referred today by Dr. [name] for evaluation of a cryptogenic stroke. She is a pleasant 59 yo woman with a h/o stroke for which she has had a nice recovery. She does not have palpitations. She has HTN. She did not undergo a TEE or ILR insertion at the time of her admission.  No Known Allergies   Current Outpatient Medications  Medication Sig Dispense Refill  . amLODipine (NORVASC) 5 MG tablet Take 1 tablet (5 mg total) by mouth daily. 30 tablet 1  . aspirin EC 81 MG EC tablet Take 1 tablet (81 mg total) by mouth daily.    Marland Kitchen atorvastatin (LIPITOR) 40 MG tablet Take 1 tablet (40 mg total) by mouth daily. 30 tablet 1  . losartan (COZAAR) 50 MG tablet Take 1 tablet (50 mg total) by mouth daily. 30 tablet 3   No current facility-administered medications for this visit.     Past Medical History:  Diagnosis Date  . Disturbance of skin sensation   . Myalgia and myositis, unspecified   . Other B-complex deficiencies   . 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 Unspecified essential hypertension     ROS:   All systems reviewed and negative except as noted in the HPI.   Past Surgical History:  Procedure Laterality Date  . CESAREAN SECTION  1992     Family History  Problem Relation Age of Onset  . Pulmonary embolism Mother   . Hepatitis Father   . Diabetes Father   . Diabetes Mellitus II Sister   . Breast cancer Neg Hx      Social History   Socioeconomic History  . Marital status: Married    Spouse name: Not on file  . Number of children: 1  . Years of education: Not on file  . Highest education level: Not on file  Occupational History  . Occupation: Science writer    Comment: self employed  Tobacco Use  . Smoking status: Never Smoker  . Smokeless tobacco: Never Used  Substance and Sexual Activity  . Alcohol use: No  . Drug use: No  . Sexual activity: Yes  Other Topics Concern  . Not on file  Social History Narrative  . Not  on file   Social Determinants of Health   Financial Resource Strain:   . Difficulty of Paying Living Expenses:   Food Insecurity:   . Worried About Programme researcher, broadcasting/film/video in the Last Year:   . Barista in the Last Year:   Transportation Needs: No Transportation Needs  . Lack of Transportation (Medical): No  . Lack of Transportation (Non-Medical): No  Physical Activity:   . Days of Exercise per Week:   . Minutes of Exercise per Session:   Stress:   . Feeling of Stress :   Social Connections:   . Frequency of Communication with Friends and Family:   . Frequency of Social Gatherings with Friends and Family:   . Attends Religious Services:   . Active Member of Clubs or Organizations:   . Attends Banker Meetings:   Marland Kitchen Marital Status:   Intimate Partner Violence:   . Fear of Current or Ex-Partner:   . Emotionally Abused:   Marland Kitchen Physically Abused:   . 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 We will schedule. I discussed the rationale for this and she understands and wishes to proceed.  2. Dyslipidemia - she will continue statin therapy. 3. HTN - she will continue losartan.   Mikle Bosworth.D.

## 2020-02-28 ENCOUNTER — Telehealth (INDEPENDENT_AMBULATORY_CARE_PROVIDER_SITE_OTHER): Payer: No Typology Code available for payment source | Admitting: Family Medicine

## 2020-02-28 ENCOUNTER — Other Ambulatory Visit: Payer: Self-pay

## 2020-02-28 ENCOUNTER — Encounter: Payer: Self-pay | Admitting: Family Medicine

## 2020-02-28 VITALS — BP 110/78 | HR 74 | Ht 62.0 in

## 2020-02-28 DIAGNOSIS — E785 Hyperlipidemia, unspecified: Secondary | ICD-10-CM | POA: Diagnosis not present

## 2020-02-28 DIAGNOSIS — I10 Essential (primary) hypertension: Secondary | ICD-10-CM

## 2020-02-28 DIAGNOSIS — Z8673 Personal history of transient ischemic attack (TIA), and cerebral infarction without residual deficits: Secondary | ICD-10-CM

## 2020-02-28 MED ORDER — AMLODIPINE BESYLATE 5 MG PO TABS: 5.0000 mg | ORAL_TABLET | Freq: Every day | ORAL | 1 refills | Status: DC

## 2020-02-28 MED ORDER — LOSARTAN POTASSIUM 50 MG PO TABS: 50.0000 mg | ORAL_TABLET | Freq: Every day | ORAL | 1 refills | Status: DC

## 2020-02-28 MED ORDER — ASPIRIN 81 MG PO TBEC: 81.0000 mg | DELAYED_RELEASE_TABLET | Freq: Every day | ORAL | 1 refills | Status: AC

## 2020-02-28 MED ORDER — ATORVASTATIN CALCIUM 40 MG PO TABS: 40.0000 mg | ORAL_TABLET | Freq: Every day | ORAL | 1 refills | Status: DC

## 2020-02-28 NOTE — Patient Instructions (Signed)

## 2020-02-28 NOTE — Progress Notes (Signed)
   VIRTUAL VISIT VIA VIDEO  I connected with Judy Gutierrez on 02/28/20 at  4:00 PM EDT by a video enabled telemedicine application and verified that I am speaking with the correct person using two identifiers. Location patient: Home Location provider: [facility] Persons participating in the virtual visit: patient ,  Dr Esmond Camper    I discussed the limitations of evaluation and management by telemedicine and the availability of in person appointments. The patient expressed understanding and agreed to proceed.   Virtual Visit via Video Note  I connected with Judy Gutierrez on 02/28/20 at  4:00 PM EDT by a video enabled telemedicine application and verified that I am speaking with the correct person using two identifiers.  Location:   I discussed the limitations of evaluation and management by telemedicine and the availability of in person appointments. The patient expressed understanding and agreed to proceed.  History of Present Illness: Pt is home and f/u bp.  Pt has no complaints Pt is coming to the house and she is doing well , balance is improving     Observations/Objective: 110/73   Pt is in nad No sob   Assessment and Plan: 1. Essential hypertension Well controlled, no changes to meds. Encouraged heart healthy diet such as the DASH diet and exercise as tolerated.   - losartan (COZAAR) 50 MG tablet; Take 1 tablet (50 mg total) by mouth daily.  Dispense: 90 tablet; Refill: 1 - amLODipine (NORVASC) 5 MG tablet; Take 1 tablet (5 mg total) by mouth daily.  Dispense: 90 tablet; Refill: 1 - aspirin 81 MG EC tablet; Take 1 tablet (81 mg total) by mouth daily.  Dispense: 90 tablet; Refill: 1  2. Hyperlipidemia, unspecified hyperlipidemia type Tolerating statin, encouraged heart healthy diet, avoid trans fats, minimize simple carbs and saturated fats. Increase exercise as tolerated - atorvastatin (LIPITOR) 40 MG tablet; Take 1 tablet (40 mg total) by mouth daily.  Dispense: 90  tablet; Refill: 1 - aspirin 81 MG EC tablet; Take 1 tablet (81 mg total) by mouth daily.  Dispense: 90 tablet; Refill: 1  3. History of completed stroke F/u neuro  F/u cardio---TEE next week  - aspirin 81 MG EC tablet; Take 1 tablet (81 mg total) by mouth daily.  Dispense: 90 tablet; Refill: 1   Follow Up Instructions: F/u 3 months or sooner prn    I discussed the assessment and treatment plan with the patient. The patient was provided an opportunity to ask questions and all were answered. The patient agreed with the plan and demonstrated an understanding of the instructions.   The patient was advised to call back or seek an in-person evaluation if the symptoms worsen or if the condition fails to improve as anticipated.  I provided 25 minutes of non-face-to-face time during this encounter.   [name], DO

## 2020-03-03 ENCOUNTER — Other Ambulatory Visit (HOSPITAL_COMMUNITY)
Admission: RE | Admit: 2020-03-03 | Discharge: 2020-03-03 | Disposition: A | Discharge status: Home / Self Care | Payer: No Typology Code available for payment source | Source: Ambulatory Visit | Attending: Diagnostic Radiology | Admitting: Diagnostic Radiology

## 2020-03-03 DIAGNOSIS — Z01812 Encounter for preprocedural laboratory examination: Secondary | ICD-10-CM | POA: Insufficient documentation

## 2020-03-03 DIAGNOSIS — Z20822 Contact with and (suspected) exposure to covid-19: Secondary | ICD-10-CM | POA: Diagnosis not present

## 2020-03-03 LAB — SARS CORONAVIRUS 2 (TAT 6-24 HRS): SARS Coronavirus 2: NEGATIVE

## 2020-03-04 ENCOUNTER — Ambulatory Visit (INDEPENDENT_AMBULATORY_CARE_PROVIDER_SITE_OTHER): Payer: No Typology Code available for payment source | Admitting: Neurology

## 2020-03-04 ENCOUNTER — Other Ambulatory Visit: Payer: Self-pay

## 2020-03-04 ENCOUNTER — Encounter: Payer: Self-pay | Admitting: Neurology

## 2020-03-04 ENCOUNTER — Telehealth: Payer: Self-pay

## 2020-03-04 VITALS — BP 128/83 | HR 84 | Ht 62.0 in | Wt 128.0 lb

## 2020-03-04 DIAGNOSIS — I639 Cerebral infarction, unspecified: Secondary | ICD-10-CM | POA: Diagnosis not present

## 2020-03-04 NOTE — Progress Notes (Signed)
[facility] [address] [place]. [place] [address] [phone]       OFFICE FOLLOW-UP NOTE  Judy Gutierrez Date of Birth:  1961/06/16 Medical Record Number:  [id number]   HPI: Judy Gutierrez is a 59 year old [place] origin lady seen today for initial office follow-up visit following hospital consultation for stroke in April 2021.  She is accompanied by her husband.  History is obtained from them, review of electronic medical records and I personally reviewed imaging films in PACS.  She presented to med [facility] emergency room with sudden onset of dizziness, diplopia, gait imbalance and vomiting.  She was seen by telemetry neurologist and given IV TPA posterior circulation stroke.  She was transferred to [facility].  Initial CT scan of the head was unremarkable but MRI scan showed a small left superior cerebellar as well as medial midbrain infarct.  2D echo showed normal ejection fraction without cardiac source of embolism.  Cardiac monitoring during hospitalization did not show any paroxysmal A. fib.  Transcranial Doppler bubble study was negative for PFO.  LDL cholesterol is elevated 138 mg percent.  Hemoglobin A1c was borderline at 6.0.  Hypercoagulable labs and vasculitic labs were negative.  CT angiogram of the brain and neck showed no significant large vessel stenosis in the neck of the brain.  Patient was discharged home on aspirin and Plavix for 3 weeks and now is currently on aspirin alone.  Patient states she is doing well.  She is getting home physical and occupational therapy.  Her dizziness and gait imbalance have improved completely.  Double vision is also gone.  She had called the office few weeks ago complaining of eye twitching and some numbness on the right side and was wondering if this was related to the transcranial Doppler study that she had had.  The symptoms also have gone.  She is scheduled to undergo TEE tomorrow followed by loop  recorder.  She is tolerating aspirin well without bruising or bleeding.  She is also tolerating Lipitor well without muscle aches and pains.  Her blood pressure is well controlled.  She ran out of Cozaar  3 days ago but yet her blood pressure today is fine at 128/83.  ROS:   14 system review of systems is positive for no complaints today PMH:  Past Medical History:  Diagnosis Date  . Disturbance of skin sensation   . Myalgia and myositis, unspecified   . Other B-complex deficiencies   . 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 Stroke (HCC)   . Unspecified essential hypertension     Social History:  Social History   Socioeconomic History  . Marital status: Married    Spouse name: Not on file  . Number of children: 1  . Years of education: Not on file  . Highest education level: Not on file  Occupational History  . Occupation: Science writer    Comment: self employed  Tobacco Use  . Smoking status: Never Smoker  . Smokeless tobacco: Never Used  Substance and Sexual Activity  . Alcohol use: No  . Drug use: No  . Sexual activity: Yes  Other Topics Concern  . Not on file  Social History Narrative  . Not on file   Social Determinants of Health   Financial Resource Strain:   . Difficulty of Paying Living Expenses:   Food Insecurity:   . Worried About Programme researcher, broadcasting/film/video in the Last Year:   .  Ran Out of Food in the Last Year:   Transportation Needs: No Transportation Needs  . Lack of Transportation (Medical): No  . Lack of Transportation (Non-Medical): No  Physical Activity:   . Days of Exercise per Week:   . Minutes of Exercise per Session:   Stress:   . Feeling of Stress :   Social Connections:   . Frequency of Communication with Friends and Family:   . Frequency of Social Gatherings with Friends and Family:   . Attends Religious Services:   . Active Member of Clubs or Organizations:   . Attends Banker Meetings:   Marland Kitchen Marital Status:     Intimate Partner Violence:   . Fear of Current or Ex-Partner:   . Emotionally Abused:   Marland Kitchen Physically Abused:   . Sexually Abused:     Medications:   Current Outpatient Medications on File Prior to Visit  Medication Sig Dispense Refill  . amLODipine (NORVASC) 5 MG tablet Take 1 tablet (5 mg total) by mouth daily. 90 tablet 1  . aspirin 81 MG EC tablet Take 1 tablet (81 mg total) by mouth daily. 90 tablet 1  . atorvastatin (LIPITOR) 40 MG tablet Take 1 tablet (40 mg total) by mouth daily. 90 tablet 1  . Calcium-Magnesium-Vitamin D (CALCIUM MAGNESIUM PO) Take by mouth daily.    . Multiple Minerals-Vitamins (MULTISOURCE CALCIUM MAG/D) TABS Take 1 tablet by mouth daily.    . Multiple Vitamin (MULTIVITAMIN WITH MINERALS) TABS tablet Take 1 tablet by mouth daily.     No current facility-administered medications on file prior to visit.    Allergies:  No Known Allergies  Physical Exam General: Frail middle-aged Caucasian lady seated, in no evident distress Head: head normocephalic and atraumatic.  Neck: supple with no carotid or supraclavicular bruits Cardiovascular: regular rate and rhythm, no murmurs Musculoskeletal: no deformity Skin:  no rash/petichiae Vascular:  Normal pulses all extremities Vitals:   03/04/20 1539  BP: 128/83  Pulse: 84   Neurologic Exam Mental Status: Awake and fully alert. Oriented to place and time. Recent and remote memory intact. Attention span, concentration and fund of knowledge appropriate. Mood and affect appropriate.  Cranial Nerves: Fundoscopic exam reveals sharp disc margins. Pupils equal, briskly reactive to light. Extraocular movements full without nystagmus. Visual fields full to confrontation. Hearing intact. Facial sensation intact. Face, tongue, palate moves normally and symmetrically.  Motor: Normal bulk and tone. Normal strength in all tested extremity muscles. Sensory.: intact to touch ,pinprick .position and vibratory sensation.   Coordination: Rapid alternating movements normal in all extremities. Finger-to-nose and heel-to-shin performed accurately bilaterally. Gait and Station: Arises from chair without difficulty. Stance is normal. Gait demonstrates normal stride length and balance . Able to heel, toe and tandem walk without difficulty.  Reflexes: 1+ and symmetric. Toes downgoing.   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  Vascular risk factors of hyperlipidemia only     PLAN: I had a long d/w patient about her  recent  Cerebellar stroke, risk for recurrent stroke/TIAs, personally independently reviewed imaging studies and stroke evaluation results and answered questions.Continue aspirin 81 mg daily  for secondary stroke prevention and maintain strict control of hypertension with blood pressure goal below 130/90, diabetes with hemoglobin A1c goal below 6.5% and lipids with LDL cholesterol goal below 70 mg/dL. I also advised the patient to eat a healthy diet  with plenty of whole grains, cereals, fruits and vegetables, exercise regularly and maintain ideal body weight .  She was advised to continue ongoing home physical and occupational therapy and have outpatient TEE tomorrow and loop recorder insertion if necessary.  She may also consider possible participation in the [place] trial if interested and will be given information to review and decide.  Followup in the future with me in 6 months or call earlier if necessary.  Greater than 50% of time during this 30 minute visit was spent on counseling,explanation of diagnosis, planning of further management, discussion with patient and family and coordination of care Antony Contras, MD  [facility] [address] [place], [place] [address]  Phone [phone] Fax [phone] Note: This document was prepared with  digital dictation and possible smart phrase technology. Any transcriptional errors that result from this process are unintentional

## 2020-03-04 NOTE — H&P (View-Only) (Signed)
[facility] [address] [place]. [place] [address] [phone]       OFFICE FOLLOW-UP NOTE  Judy Gutierrez Date of Birth:  03/05/1961 Medical Record Number:  [id number]   HPI: Ms. Carneiro is a 58-year-old South Asian Indian origin lady seen today for initial office follow-up visit following hospital consultation for stroke in April 2021.  She is accompanied by her husband.  History is obtained from them, review of electronic medical records and I personally reviewed imaging films in PACS.  [facility] with sudden onset of dizziness, diplopia, gait imbalance and vomiting.  She was seen by telemetry neurologist and given IV TPA posterior circulation stroke.  She was transferred to [facility].  Initial CT scan of the head was unremarkable but MRI scan showed a small left superior cerebellar as well as medial midbrain infarct.  2D echo showed normal ejection fraction without cardiac source of embolism.  Cardiac monitoring during hospitalization did not show any paroxysmal A. fib.  Transcranial Doppler bubble study was negative for PFO.  LDL cholesterol is elevated 138 mg percent.  Hemoglobin A1c was borderline at 6.0.  Hypercoagulable labs and vasculitic labs were negative.  CT angiogram of the brain and neck showed no significant large vessel stenosis in the neck of the brain.  Patient was discharged home on aspirin and Plavix for 3 weeks and now is currently on aspirin alone.  Patient states she is doing well.  She is getting home physical and occupational therapy.  Her dizziness and gait imbalance have improved completely.  Double vision is also gone.  She had called the office few weeks ago complaining of eye twitching and some numbness on the right side and was wondering if this was related to the transcranial Doppler study that she had had.  The symptoms also have gone.  She is scheduled to undergo TEE tomorrow followed by loop  recorder.  She is tolerating aspirin well without bruising or bleeding.  She is also tolerating Lipitor well without muscle aches and pains.  Her blood pressure is well controlled.  She ran out of Cozaar  3 days ago but yet her blood pressure today is fine at 128/83.  ROS:   14 system review of systems is positive for no complaints today PMH:  Past Medical History:  Diagnosis Date  . Disturbance of skin sensation   . Myalgia and myositis, unspecified   . Other B-complex deficiencies   . 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 Stroke (HCC)   . Unspecified essential hypertension     Social History:  Social History   Socioeconomic History  . Marital status: Married    Spouse name: Not on file  . Number of children: 1  . Years of education: Not on file  . Highest education level: Not on file  Occupational History  . Occupation: convenience store    Comment: self employed  Tobacco Use  . Smoking status: Never Smoker  . Smokeless tobacco: Never Used  Substance and Sexual Activity  . Alcohol use: No  . Drug use: No  . Sexual activity: Yes  Other Topics Concern  . Not on file  Social History Narrative  . Not on file   Social Determinants of Health   Financial Resource Strain:   . Difficulty of Paying Living Expenses:   Food Insecurity:   . Worried About Running Out of Food in the Last Year:   .   Ran Out of Food in the Last Year:   Transportation Needs: No Transportation Needs  . Lack of Transportation (Medical): No  . Lack of Transportation (Non-Medical): No  Physical Activity:   . Days of Exercise per Week:   . Minutes of Exercise per Session:   Stress:   . Feeling of Stress :   Social Connections:   . Frequency of Communication with Friends and Family:   . Frequency of Social Gatherings with Friends and Family:   . Attends Religious Services:   . Active Member of Clubs or Organizations:   . Attends Banker Meetings:   Marland Kitchen Marital Status:     Intimate Partner Violence:   . Fear of Current or Ex-Partner:   . Emotionally Abused:   Marland Kitchen Physically Abused:   . Sexually Abused:     Medications:   Current Outpatient Medications on File Prior to Visit  Medication Sig Dispense Refill  . amLODipine (NORVASC) 5 MG tablet Take 1 tablet (5 mg total) by mouth daily. 90 tablet 1  . aspirin 81 MG EC tablet Take 1 tablet (81 mg total) by mouth daily. 90 tablet 1  . atorvastatin (LIPITOR) 40 MG tablet Take 1 tablet (40 mg total) by mouth daily. 90 tablet 1  . Calcium-Magnesium-Vitamin D (CALCIUM MAGNESIUM PO) Take by mouth daily.    . Multiple Minerals-Vitamins (MULTISOURCE CALCIUM MAG/D) TABS Take 1 tablet by mouth daily.    . Multiple Vitamin (MULTIVITAMIN WITH MINERALS) TABS tablet Take 1 tablet by mouth daily.     No current facility-administered medications on file prior to visit.    Allergies:  No Known Allergies  Physical Exam General: Frail middle-aged Caucasian lady seated, in no evident distress Head: head normocephalic and atraumatic.  Neck: supple with no carotid or supraclavicular bruits Cardiovascular: regular rate and rhythm, no murmurs Musculoskeletal: no deformity Skin:  no rash/petichiae Vascular:  Normal pulses all extremities Vitals:   03/04/20 1539  BP: 128/83  Pulse: 84   Neurologic Exam Mental Status: Awake and fully alert. Oriented to place and time. Recent and remote memory intact. Attention span, concentration and fund of knowledge appropriate. Mood and affect appropriate.  Cranial Nerves: Fundoscopic exam reveals sharp disc margins. Pupils equal, briskly reactive to light. Extraocular movements full without nystagmus. Visual fields full to confrontation. Hearing intact. Facial sensation intact. Face, tongue, palate moves normally and symmetrically.  Motor: Normal bulk and tone. Normal strength in all tested extremity muscles. Sensory.: intact to touch ,pinprick .position and vibratory sensation.   Coordination: Rapid alternating movements normal in all extremities. Finger-to-nose and heel-to-shin performed accurately bilaterally. Gait and Station: Arises from chair without difficulty. Stance is normal. Gait demonstrates normal stride length and balance . Able to heel, toe and tandem walk without difficulty.  Reflexes: 1+ and symmetric. Toes downgoing.   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  Vascular risk factors of hyperlipidemia only     PLAN: I had a long d/w patient about her  recent  Cerebellar stroke, risk for recurrent stroke/TIAs, personally independently reviewed imaging studies and stroke evaluation results and answered questions.Continue aspirin 81 mg daily  for secondary stroke prevention and maintain strict control of hypertension with blood pressure goal below 130/90, diabetes with hemoglobin A1c goal below 6.5% and lipids with LDL cholesterol goal below 70 mg/dL. I also advised the patient to eat a healthy diet  with plenty of whole grains, cereals, fruits and vegetables, exercise regularly and maintain ideal body weight .  She was advised to continue ongoing home physical and occupational therapy and have outpatient TEE tomorrow and loop recorder insertion if necessary.  She may also consider possible participation in the [place] trial if interested and will be given information to review and decide.  Followup in the future with me in 6 months or call earlier if necessary.  Greater than 50% of time during this 30 minute visit was spent on counseling,explanation of diagnosis, planning of further management, discussion with patient and family and coordination of care Antony Contras, MD  [facility] [address] [place], [place] [address]  Phone [phone] Fax [phone] Note: This document was prepared with  digital dictation and possible smart phrase technology. Any transcriptional errors that result from this process are unintentional

## 2020-03-04 NOTE — Telephone Encounter (Signed)
Patient called in to speak with the nurse about her medication. Please give the patient a call at [phone] as soon as possible.

## 2020-03-04 NOTE — Anesthesia Preprocedure Evaluation (Addendum)
Anesthesia Evaluation  Patient identified by MRN, date of birth, ID band Patient awake    Reviewed: Allergy & Precautions, H&P , NPO status , Patient's Chart, lab work & pertinent test results  Airway Mallampati: II  TM Distance: >3 FB Neck ROM: Full    Dental no notable dental hx. (+) Teeth Intact, Dental Advisory Given   Pulmonary neg pulmonary ROS,    Pulmonary exam normal breath sounds clear to auscultation       Cardiovascular Exercise Tolerance: Good hypertension, Pt. on medications  Rhythm:Regular Rate:Normal     Neuro/Psych CVA, No Residual Symptoms negative psych ROS   GI/Hepatic negative GI ROS, Neg liver ROS,   Endo/Other  negative endocrine ROS  Renal/GU negative Renal ROS  negative genitourinary   Musculoskeletal   Abdominal   Peds  Hematology  (+) Blood dyscrasia, anemia ,   Anesthesia Other Findings   Reproductive/Obstetrics negative OB ROS                            Anesthesia Physical Anesthesia Plan  ASA: II  Anesthesia Plan: MAC   Post-op Pain Management:    Induction: Intravenous  PONV Risk Score and Plan: 2 and Propofol infusion  Airway Management Planned: Nasal Cannula  Additional Equipment:   Intra-op Plan:   Post-operative Plan:   Informed Consent: I have reviewed the patients History and Physical, chart, labs and discussed the procedure including the risks, benefits and alternatives for the proposed anesthesia with the patient or authorized representative who has indicated his/her understanding and acceptance.     Dental advisory given  Plan Discussed with: CRNA  Anesthesia Plan Comments:         Anesthesia Quick Evaluation

## 2020-03-04 NOTE — Telephone Encounter (Signed)
Pt called. VM to call back

## 2020-03-04 NOTE — Patient Instructions (Addendum)
I had a long d/w patient about her  recent  Cerebellar stroke, risk for recurrent stroke/TIAs, personally independently reviewed imaging studies and stroke evaluation results and answered questions.Continue aspirin 81 mg daily  for secondary stroke prevention and maintain strict control of hypertension with blood pressure goal below 130/90, diabetes with hemoglobin A1c goal below 6.5% and lipids with LDL cholesterol goal below 70 mg/dL. I also advised the patient to eat a healthy diet with plenty of whole grains, cereals, fruits and vegetables, exercise regularly and maintain ideal body weight .  She was advised to continue ongoing home physical and occupational therapy and have outpatient TEE tomorrow and loop recorder insertion if necessary.  She may also consider possible participation in the [place] trial if interested and will be given information to review and decide.  Followup in the future with me in 6 months or call earlier if necessary.  Stroke Prevention Some medical conditions and behaviors are associated with a higher chance of having a stroke. You can help prevent a stroke by making nutrition, lifestyle, and other changes, including managing any medical conditions you may have. What nutrition changes can be made?   Eat healthy foods. You can do this by: ? Choosing foods high in fiber, such as fresh fruits and vegetables and whole grains. ? Eating at least 5 or more servings of fruits and vegetables a day. Try to fill half of your plate at each meal with fruits and vegetables. ? Choosing lean protein foods, such as lean cuts of meat, poultry without skin, fish, tofu, beans, and nuts. ? Eating low-fat dairy products. ? Avoiding foods that are high in salt (sodium). This can help lower blood pressure. ? Avoiding foods that have saturated fat, trans fat, and cholesterol. This can help prevent high cholesterol. ? Avoiding processed and premade foods.  Follow your health care provider's  specific guidelines for losing weight, controlling high blood pressure (hypertension), lowering high cholesterol, and managing diabetes. These may include: ? Reducing your daily calorie intake. ? Limiting your daily sodium intake to 1,500 milligrams (mg). ? Using only healthy fats for cooking, such as olive oil, canola oil, or sunflower oil. ? Counting your daily carbohydrate intake. What lifestyle changes can be made?  Maintain a healthy weight. Talk to your health care provider about your ideal weight.  Get at least 30 minutes of moderate physical activity at least 5 days a week. Moderate activity includes brisk walking, biking, and swimming.  Do not use any products that contain nicotine or tobacco, such as cigarettes and e-cigarettes. If you need help quitting, ask your health care provider. It may also be helpful to avoid exposure to secondhand smoke.  Limit alcohol intake to no more than 1 drink a day for nonpregnant women and 2 drinks a day for men. One drink equals 12 oz of beer, 5 oz of wine, or 1 oz of hard liquor.  Stop any illegal drug use.  Avoid taking birth control pills. Talk to your health care provider about the risks of taking birth control pills if: ? You are over 15 years old. ? You smoke. ? You get migraines. ? You have ever had a blood clot. What other changes can be made?  Manage your cholesterol levels. ? Eating a healthy diet is important for preventing high cholesterol. If cholesterol cannot be managed through diet alone, you may also need to take medicines. ? Take any prescribed medicines to control your cholesterol as told by your health care provider.  Manage your diabetes. ? Eating a healthy diet and exercising regularly are important parts of managing your blood sugar. If your blood sugar cannot be managed through diet and exercise, you may need to take medicines. ? Take any prescribed medicines to control your diabetes as told by your health care  provider.  Control your hypertension. ? To reduce your risk of stroke, try to keep your blood pressure below 130/80. ? Eating a healthy diet and exercising regularly are an important part of controlling your blood pressure. If your blood pressure cannot be managed through diet and exercise, you may need to take medicines. ? Take any prescribed medicines to control hypertension as told by your health care provider. ? Ask your health care provider if you should monitor your blood pressure at home. ? Have your blood pressure checked every year, even if your blood pressure is normal. Blood pressure increases with age and some medical conditions.  Get evaluated for sleep disorders (sleep apnea). Talk to your health care provider about getting a sleep evaluation if you snore a lot or have excessive sleepiness.  Take over-the-counter and prescription medicines only as told by your health care provider. Aspirin or blood thinners (antiplatelets or anticoagulants) may be recommended to reduce your risk of forming blood clots that can lead to stroke.  Make sure that any other medical conditions you have, such as atrial fibrillation or atherosclerosis, are managed. What are the warning signs of a stroke? The warning signs of a stroke can be easily remembered as BEFAST.  B is for balance. Signs include: ? Dizziness. ? Loss of balance or coordination. ? Sudden trouble walking.  E is for eyes. Signs include: ? A sudden change in vision. ? Trouble seeing.  F is for face. Signs include: ? Sudden weakness or numbness of the face. ? The face or eyelid drooping to one side.  A is for arms. Signs include: ? Sudden weakness or numbness of the arm, usually on one side of the body.  S is for speech. Signs include: ? Trouble speaking (aphasia). ? Trouble understanding.  T is for time. ? These symptoms may represent a serious problem that is an emergency. Do not wait to see if the symptoms will go away.  Get medical help right away. Call your local emergency services (911 in the [place] Do not drive yourself to the hospital.  Other signs of stroke may include: ? A sudden, severe headache with no known cause. ? Nausea or vomiting. ? Seizure. Where to find more information For more information, visit:  American Stroke Association: www.strokeassociation.org  National Stroke Association: www.stroke.org Summary  You can prevent a stroke by eating healthy, exercising, not smoking, limiting alcohol intake, and managing any medical conditions you may have.  Do not use any products that contain nicotine or tobacco, such as cigarettes and e-cigarettes. If you need help quitting, ask your health care provider. It may also be helpful to avoid exposure to secondhand smoke.  Remember BEFAST for warning signs of stroke. Get help right away if you or a loved one has any of these signs. This information is not intended to replace advice given to you by your health care provider. Make sure you discuss any questions you have with your health care provider. Document Revised: 09/15/2017 Document Reviewed: 11/08/2016 Elsevier Patient Education  2020 [employer].

## 2020-03-04 NOTE — Telephone Encounter (Signed)
Caller: Charina Call back phone number: [phone]  Patient states that on her last appointment with Dr. Laury Axon she was told she was going to discontinue losartan (COZAAR) 50 MG tablet  However, a prescription was sent to the pharmacy.  Patient needs clarification.  Please advise

## 2020-03-05 ENCOUNTER — Encounter (HOSPITAL_COMMUNITY)
Admission: RE | Disposition: A | Discharge status: Home / Self Care | Payer: Self-pay | Source: Home / Self Care | Attending: Cardiology

## 2020-03-05 ENCOUNTER — Ambulatory Visit (HOSPITAL_COMMUNITY): Payer: No Typology Code available for payment source | Admitting: Anesthesiology

## 2020-03-05 ENCOUNTER — Ambulatory Visit (HOSPITAL_BASED_OUTPATIENT_CLINIC_OR_DEPARTMENT_OTHER)
Admission: RE | Admit: 2020-03-05 | Discharge: 2020-03-05 | Disposition: A | Discharge status: Home / Self Care | Payer: No Typology Code available for payment source | Source: Home / Self Care | Attending: Cardiology | Admitting: Cardiology

## 2020-03-05 ENCOUNTER — Ambulatory Visit (HOSPITAL_COMMUNITY)
Admission: RE | Admit: 2020-03-05 | Discharge: 2020-03-05 | Disposition: A | Discharge status: Home / Self Care | Payer: No Typology Code available for payment source | Attending: Cardiology | Admitting: Cardiology

## 2020-03-05 ENCOUNTER — Encounter (HOSPITAL_COMMUNITY): Payer: Self-pay | Admitting: Cardiology

## 2020-03-05 ENCOUNTER — Ambulatory Visit (HOSPITAL_COMMUNITY): Admit: 2020-03-05 | Payer: No Typology Code available for payment source | Admitting: Internal Medicine

## 2020-03-05 ENCOUNTER — Other Ambulatory Visit: Payer: Self-pay

## 2020-03-05 ENCOUNTER — Other Ambulatory Visit: Payer: Self-pay | Admitting: *Deleted

## 2020-03-05 DIAGNOSIS — Z7982 Long term (current) use of aspirin: Secondary | ICD-10-CM | POA: Diagnosis not present

## 2020-03-05 DIAGNOSIS — I10 Essential (primary) hypertension: Secondary | ICD-10-CM

## 2020-03-05 DIAGNOSIS — Z7902 Long term (current) use of antithrombotics/antiplatelets: Secondary | ICD-10-CM | POA: Insufficient documentation

## 2020-03-05 DIAGNOSIS — I639 Cerebral infarction, unspecified: Secondary | ICD-10-CM | POA: Insufficient documentation

## 2020-03-05 DIAGNOSIS — Z79899 Other long term (current) drug therapy: Secondary | ICD-10-CM | POA: Insufficient documentation

## 2020-03-05 HISTORY — PX: TEE WITHOUT CARDIOVERSION: SHX5443

## 2020-03-05 HISTORY — PX: BUBBLE STUDY: SHX6837

## 2020-03-05 HISTORY — PX: LOOP RECORDER INSERTION: EP1214

## 2020-03-05 SURGERY — LOOP RECORDER INSERTION

## 2020-03-05 SURGERY — ECHOCARDIOGRAM, TRANSESOPHAGEAL
Anesthesia: Monitor Anesthesia Care

## 2020-03-05 MED ORDER — PROPOFOL 500 MG/50ML IV EMUL
INTRAVENOUS | Status: DC | PRN
  Administered 2020-03-05: 100 ug/kg/min via INTRAVENOUS

## 2020-03-05 MED ORDER — SODIUM CHLORIDE 0.9 % IV SOLN: INTRAVENOUS | Status: DC

## 2020-03-05 MED ORDER — LIDOCAINE-EPINEPHRINE 1 %-1:100000 IJ SOLN
INTRAMUSCULAR | Status: DC | PRN
  Administered 2020-03-05: 20 mL

## 2020-03-05 MED ORDER — LIDOCAINE-EPINEPHRINE 1 %-1:100000 IJ SOLN
INTRAMUSCULAR | Status: AC
  Filled 2020-03-05: qty 1

## 2020-03-05 MED ORDER — LACTATED RINGERS IV SOLN
INTRAVENOUS | Status: AC | PRN
  Administered 2020-03-05: 1000 mL via INTRAVENOUS

## 2020-03-05 MED ORDER — PROPOFOL 10 MG/ML IV BOLUS
INTRAVENOUS | Status: DC | PRN
  Administered 2020-03-05: 30 mg via INTRAVENOUS
  Administered 2020-03-05 (×3): 20 mg via INTRAVENOUS

## 2020-03-05 SURGICAL SUPPLY — 2 items
MONITOR REVEAL LINQ II (Prosthesis & Implant Heart) ×1 IMPLANT
PACK LOOP INSERTION (CUSTOM PROCEDURE TRAY) ×2 IMPLANT

## 2020-03-05 NOTE — Interval H&P Note (Signed)
History and Physical Interval Note:  03/05/2020 7:43 AM  Judy Gutierrez  has presented today for surgery, with the diagnosis of CRYPTOGENIC STROKE.  The various methods of treatment have been discussed with the patient and family. After consideration of risks, benefits and other options for treatment, the patient has consented to  Procedure(s): TRANSESOPHAGEAL ECHOCARDIOGRAM (TEE) (N/A) as a surgical intervention.  The patient's history has been reviewed, patient examined, no change in status, stable for surgery.  I have reviewed the patient's chart and labs.  Questions were answered to the patient's satisfaction.     [name]

## 2020-03-05 NOTE — Transfer of Care (Signed)
Immediate Anesthesia Transfer of Care Note  Patient: Judy Gutierrez  Procedure(s) Performed: TRANSESOPHAGEAL ECHOCARDIOGRAM (TEE) (N/A ) BUBBLE STUDY  Patient Location: Endoscopy Unit  Anesthesia Type:MAC  Level of Consciousness: drowsy and patient cooperative  Airway & Oxygen Therapy: Patient Spontanous Breathing and Patient connected to nasal cannula oxygen  Post-op Assessment: Report given to RN, Post -op Vital signs reviewed and stable and Patient moving all extremities  Post vital signs: Reviewed and stable  Last Vitals:  Vitals Value Taken Time  BP 98/53 03/05/20 0843  Temp    Pulse 68 03/05/20 0843  Resp 18 03/05/20 0843  SpO2 100 % 03/05/20 0843  Vitals shown include unvalidated device data.  Last Pain:  Vitals:   03/05/20 0843  TempSrc:   PainSc: 0-No pain         Complications: No apparent anesthesia complications

## 2020-03-05 NOTE — Progress Notes (Signed)
REviewed the notes from Dr Leonia Reeves and PS We discussed the role of monitoring for afib in the setting of Cryptogenic Stroke and the lack of prospective data on anticoagulation in pts with SCAF identified on monitoring, the strong data for the role of anticoagulation in patients with known afib and antecedent stroke.    Will plan loop based on TEE findings    BP (!) 98/53   Pulse 69   Temp (!) 97.5 F (36.4 C) (Axillary)   Resp 20   Ht 5\' 2"  (1.575 m)   Wt 58.1 kg   LMP 12/08/2010   SpO2 100%   BMI 23.41 kg/m  Well developed and nourished in no acute distress HENT normal Neck supple with JVP-  Flat  Clear Regular rate and rhythm, no murmurs or gallops Abd-soft with active BS No Clubbing cyanosis edema Skin-warm and dry A & Oriented  Grossly normal sensory and motor function     Cryptogenic Stroke    For loop recorder

## 2020-03-05 NOTE — Progress Notes (Signed)
Echocardiogram Echocardiogram Transesophageal has been performed.  Warren Lacy Chantele Corado 03/05/2020, 8:53 AM

## 2020-03-05 NOTE — Discharge Instructions (Signed)

## 2020-03-05 NOTE — Anesthesia Postprocedure Evaluation (Signed)
Anesthesia Post Note  Patient: Judy Gutierrez  Procedure(s) Performed: TRANSESOPHAGEAL ECHOCARDIOGRAM (TEE) (N/A ) BUBBLE STUDY     Patient location during evaluation: Endoscopy Anesthesia Type: MAC Level of consciousness: awake and alert Pain management: pain level controlled Vital Signs Assessment: post-procedure vital signs reviewed and stable Respiratory status: spontaneous breathing, nonlabored ventilation and respiratory function stable Cardiovascular status: stable and blood pressure returned to baseline Postop Assessment: no apparent nausea or vomiting Anesthetic complications: no    Last Vitals:  Vitals:   03/05/20 0930 03/05/20 0940  BP: 136/74 103/80  Pulse:    Resp: 11 12  Temp:    SpO2: 100% 100%    Last Pain:  Vitals:   03/05/20 0940  TempSrc:   PainSc: 0-No pain                 Joanathan Affeldt,W. EDMOND

## 2020-03-05 NOTE — Patient Outreach (Signed)
[facility]) Care Management  03/05/2020  Judy Gutierrez 04-11-1961 [id number]   Subjective: Telephone call to patient's home / mobile number, spoke with patient, and HIPAA verified.    Patient states she remembers speaking with this RNCM in the past.  Patient states she is doing well, everything is going well, has had 4 home health physical therapy visits to date, visits have went very well, patient functioning at a high level, will have the last visit next week to finish up goals, and will discharged due to meeting goals.  States she had TEE and loop recorder placed today, procedures went well, doing fine.  States the following appointments with well: on 03/04/2020 with neurologist, on 02/28/2020 with priamry MD, and on 02/25/2020 with cardiologist.   Patient states she does not have any education material, EMMI follow up, care coordination, care management, disease monitoring, transportation, community resource, or pharmacy needs at this time.  States she is very appreciative of the follow up, is aware that EMMI 30 day follow up is completed, and has no additional needs at this time.    Objective:Per KPN (Knowledge Performance Now, point of care tool) and chart review,patient hospitalized 01/29/2020 - 02/01/2020 forLeft cerebellar embolic stroke of cryptogenic etiology,treated with tPA. Patient also has a history of hypertension, Elevated HGBA1C 6.0 - outpatientfollow up,Hyperlipidemia,Myalgia, myositis,B-complex deficiencies, andHypokalemia.      Assessment: Received [employer] Rule EMMI Stroke [employer] Alert follow up referral on 02/05/2020. Red Flag Alert Trigger, Day #1, patient answered yes to the following question: Problems setting up rehab?EMMI follow up completed and no further care management needs.       Plan: RNCM will complete case closure due to follow up completed / no care management needs.       Hilaria Titsworth H. Gardiner Barefoot,  BSN, CCM [facility] Care Management [facility] Telephonic CM Phone: [phone] Fax: [phone]

## 2020-03-05 NOTE — CV Procedure (Signed)
     TRANSESOPHAGEAL ECHOCARDIOGRAM   NAME:  Judy Gutierrez   MRN: [id number] DOB:  [birth year]   ADMIT DATE: 03/05/2020  INDICATIONS: CVA  PROCEDURE:   Informed consent was obtained prior to the procedure. The risks, benefits and alternatives for the procedure were discussed and the patient comprehended these risks.  Risks include, but are not limited to, cough, sore throat, vomiting, nausea, somnolence, esophageal and stomach trauma or perforation, bleeding, low blood pressure, aspiration, pneumonia, infection, trauma to the teeth and death.    After a procedural time-out, the oropharynx was anesthetized and the patient was sedated by the anesthesia service. The transesophageal probe was inserted in the esophagus and stomach without difficulty and multiple views were obtained. Anesthesia was monitored by Merril Abbe, CRNA and Dr Sampson Goon.    COMPLICATIONS:    There were no immediate complications.  FINDINGS:  No thrombus, vegetation, or mass seen.  Negative bubble study.   Epifanio Lesches MD [facility]  [address], [address] [place], [place] [address] [phone]   8:46 AM

## 2020-03-17 ENCOUNTER — Other Ambulatory Visit: Payer: Self-pay

## 2020-03-17 ENCOUNTER — Ambulatory Visit (INDEPENDENT_AMBULATORY_CARE_PROVIDER_SITE_OTHER): Payer: No Typology Code available for payment source | Admitting: Emergency Medicine

## 2020-03-17 DIAGNOSIS — I639 Cerebral infarction, unspecified: Secondary | ICD-10-CM

## 2020-03-17 LAB — CUP PACEART INCLINIC DEVICE CHECK
Date Time Interrogation Session: 20210601172431
Implantable Pulse Generator Implant Date: 20210520

## 2020-03-17 NOTE — Progress Notes (Signed)
ILR wound check in clinic. Steri strips removed. R-waves 0.33 mV. Wound well healed. Home monitor transmitting nightly. No episodes. Questions answered.

## 2020-03-31 ENCOUNTER — Telehealth: Payer: Self-pay | Admitting: Family Medicine

## 2020-03-31 ENCOUNTER — Other Ambulatory Visit: Payer: Self-pay

## 2020-03-31 NOTE — Telephone Encounter (Signed)
Caller : Judy Gutierrez Call Back # [phone]  Patient states on Losartan and patient wonders if she should still take medication. Patient only has four pill left. Patient wonders if she needs a refill.  Please Advise

## 2020-03-31 NOTE — Telephone Encounter (Signed)
Spoke with patient and called pharmacy. Pharmacy had losartan on file with refills. Pt notified.

## 2020-04-07 ENCOUNTER — Telehealth: Payer: Self-pay

## 2020-04-07 ENCOUNTER — Ambulatory Visit (INDEPENDENT_AMBULATORY_CARE_PROVIDER_SITE_OTHER): Payer: No Typology Code available for payment source | Admitting: *Deleted

## 2020-04-07 DIAGNOSIS — I639 Cerebral infarction, unspecified: Secondary | ICD-10-CM

## 2020-04-07 LAB — CUP PACEART REMOTE DEVICE CHECK
Date Time Interrogation Session: 20210622094655
Implantable Pulse Generator Implant Date: 20210520

## 2020-04-07 NOTE — Telephone Encounter (Signed)
The pt states she can feel something round under her skin where the loop recorder at. I told her the tip of the loop recorder is round and she can feel it.

## 2020-04-08 ENCOUNTER — Telehealth: Payer: Self-pay | Admitting: Family Medicine

## 2020-04-08 NOTE — Progress Notes (Signed)
Carelink Summary Report / Loop Recorder 

## 2020-04-08 NOTE — Telephone Encounter (Signed)
Caller : Jung Call Back # [phone]  Patient calling in regards to speaking to Dr Zola Button about disability paper work. Patient states recently had a stoke and doesn't believe she can go back to work.

## 2020-04-09 NOTE — Telephone Encounter (Signed)
Will need an appt to discuss  

## 2020-04-10 NOTE — Telephone Encounter (Signed)
Patient scheduled Monday June 28

## 2020-04-13 ENCOUNTER — Ambulatory Visit: Payer: No Typology Code available for payment source | Admitting: Family Medicine

## 2020-04-13 ENCOUNTER — Other Ambulatory Visit: Payer: Self-pay

## 2020-04-13 ENCOUNTER — Encounter: Payer: Self-pay | Admitting: Family Medicine

## 2020-04-13 VITALS — BP 110/68 | HR 80 | Temp 97.1°F | Resp 18 | Ht 62.0 in | Wt 126.0 lb

## 2020-04-13 DIAGNOSIS — I69993 Ataxia following unspecified cerebrovascular disease: Secondary | ICD-10-CM | POA: Diagnosis not present

## 2020-04-13 NOTE — Progress Notes (Signed)
Patient ID: Judy Gutierrez, female    DOB: 01-25-1961  Age: 59 y.o. MRN: [id number]    Subjective:  Subjective  HPI Judy Gutierrez presents for f/u bp and cva.  Neuro told her she will not be able to go back to work.   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  Eyes: Negative for pain, redness and visual disturbance.  Respiratory: Negative for cough, chest tightness, shortness of breath and wheezing.   Cardiovascular: Negative for chest pain, palpitations and leg swelling.  Endocrine: Negative for cold intolerance, heat intolerance, polydipsia, polyphagia and polyuria.  Genitourinary: Negative for difficulty urinating, dysuria and frequency.  Neurological: Negative for dizziness, light-headedness, numbness and headaches.    History Past Medical History:  Diagnosis Date  . Disturbance of skin sensation   . Myalgia and myositis, unspecified   . Other B-complex deficiencies   . 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 Stroke ([place])   . Unspecified essential hypertension     She has a past surgical history that includes Cesarean section (1992); LOOP RECORDER INSERTION (N/A, 03/05/2020); TEE without cardioversion (N/A, 03/05/2020); and Bubble study (03/05/2020).   Her family history includes Diabetes in her father; Diabetes Mellitus II in her sister; Hepatitis in her father; Pulmonary embolism in her mother.She reports that she has never smoked. She has never used smokeless tobacco. She reports that she does not drink alcohol and does not use drugs.  Current Outpatient Medications on File Prior to Visit  Medication Sig Dispense Refill  . amLODipine (NORVASC) 5 MG tablet Take 1 tablet (5 mg total) by mouth daily. 90 tablet 1  . aspirin 81 MG EC tablet Take 1 tablet (81 mg total) by mouth daily. 90 tablet 1  . atorvastatin  (LIPITOR) 40 MG tablet Take 1 tablet (40 mg total) by mouth daily. 90 tablet 1  . Calcium-Magnesium-Vitamin D (CALCIUM MAGNESIUM PO) Take by mouth daily.    Marland Kitchen losartan (COZAAR) 50 MG tablet Take 50 mg by mouth daily.    . Multiple Minerals-Vitamins (MULTISOURCE CALCIUM MAG/D) TABS Take 1 tablet by mouth daily.    . Multiple Vitamin (MULTIVITAMIN WITH MINERALS) TABS tablet Take 1 tablet by mouth daily.     No current facility-administered medications on file prior to visit.     Objective:  Objective  Physical Exam Vitals and nursing note reviewed.  Constitutional:      Appearance: She is well-developed.  Eyes:     Conjunctiva/sclera: Conjunctivae normal.  Neck:     Thyroid: No thyromegaly.     Vascular: No carotid bruit or JVD.  Cardiovascular:     Rate and Rhythm: Normal rate and regular rhythm.  Pulmonary:     Effort: Pulmonary effort is normal. No respiratory distress.  Chest:     Chest wall: No tenderness.  Musculoskeletal:     Cervical back: Normal range of motion and neck supple.  Neurological:     General: No focal deficit present.     Mental Status: She is alert and oriented to person, place, and time.     Comments: Walks with cane     BP 110/68 (BP Location: Right Arm, Patient Position: Sitting, Cuff Size: Normal)   Pulse 80   Temp (!) 97.1 F (36.2 C) (Temporal)   Resp 18   Ht 5\' 2"  (1.575 m)  Wt 126 lb (57.2 kg)   LMP 12/08/2010   SpO2 99%   BMI 23.05 kg/m  Wt Readings from Last 3 Encounters:  04/13/20 126 lb (57.2 kg)  03/05/20 128 lb (58.1 kg)  03/04/20 128 lb (58.1 kg)     Lab Results  Component Value Date   WBC 6.2 02/01/2020   HGB 12.4 02/01/2020   HCT 39.5 02/01/2020   PLT 287 02/01/2020   GLUCOSE 114 (H) 02/04/2020   CHOL 202 (H) 01/30/2020   TRIG 27 01/30/2020   HDL 59 01/30/2020   LDLCALC 138 (H) 01/30/2020   ALT 29 01/29/2020   AST 42 (H) 01/29/2020   NA 137 02/04/2020   K 4.1 02/04/2020   CL 101 02/04/2020   CREATININE 0.63  02/04/2020   BUN 19 02/04/2020   CO2 29 02/04/2020   TSH 3.20 06/28/2018   INR 1.0 01/29/2020   HGBA1C 6.0 (H) 01/30/2020    ECHO TEE  Result Date: 03/05/2020    TRANSESOPHOGEAL ECHO REPORT   Patient Name:   Judy Gutierrez Date of Exam: 03/05/2020 Medical Rec #:  [id number]   Height:       62.0 in Accession #:    [id number]  Weight:       128.0 lb Date of Birth:  10/18/1960  BSA:          1.581 m Patient Age:    58 years    BP:           170/79 mmHg Patient Gender: F           HR:           68 bpm. Exam Location:  Inpatient Procedure: Transesophageal Echo, Color Doppler, Cardiac Doppler and Saline            Contrast Bubble Study Indications:     Stroke i163.9  History:         Patient has prior history of Echocardiogram examinations, most                  recent 01/30/2020. Risk Factors:Hypertension.  Sonographer:     Irving Burton Senior RDCS Referring Phys:  [id number] [name] Diagnosing Phys: Epifanio Lesches MD PROCEDURE: After discussion of the risks and benefits of a TEE, an informed consent was obtained from the patient. The transesophogeal probe was passed without difficulty through the esophogus of the patient. Sedation performed by different physician. The patient was monitored while under deep sedation. Anesthestetic sedation was provided intravenously by Anesthesiology: 224mg  of Propofol. The patient developed no complications during the procedure. IMPRESSIONS  1. Left ventricular ejection fraction, by estimation, is 60 to 65%. The left ventricle has normal function. The left ventricle has no regional wall motion abnormalities.  2. Right ventricular systolic function is normal. The right ventricular size is normal.  3. No left atrial/left atrial appendage thrombus was detected.  4. The mitral valve is normal in structure. Trivial mitral valve regurgitation.  5. The aortic valve is tricuspid. Aortic valve regurgitation is trivial. No aortic stenosis is present.  6. Agitated saline contrast  bubble study was negative, with no evidence of any interatrial shunt. FINDINGS  Left Ventricle: Left ventricular ejection fraction, by estimation, is 60 to 65%. The left ventricle has normal function. The left ventricle has no regional wall motion abnormalities. The left ventricular internal cavity size was normal in size. There is  no left ventricular hypertrophy. Right Ventricle: The right ventricular size is normal. Right vetricular wall thickness was  not assessed. Right ventricular systolic function is normal. Left Atrium: Left atrial size was normal in size. No left atrial/left atrial appendage thrombus was detected. Right Atrium: Right atrial size was normal in size. Pericardium: There is no evidence of pericardial effusion. Mitral Valve: The mitral valve is normal in structure. Trivial mitral valve regurgitation. Tricuspid Valve: The tricuspid valve is normal in structure. Tricuspid valve regurgitation is mild. Aortic Valve: The aortic valve is tricuspid. Aortic valve regurgitation is trivial. No aortic stenosis is present. Pulmonic Valve: The pulmonic valve was grossly normal. Pulmonic valve regurgitation is trivial. Aorta: The aortic root is normal in size and structure. IAS/Shunts: No atrial level shunt detected by color flow Doppler. Agitated saline contrast was given intravenously to evaluate for intracardiac shunting. Agitated saline contrast bubble study was negative, with no evidence of any interatrial shunt. Epifanio Lesches MD Electronically signed by Epifanio Lesches MD Signature Date/Time: 03/05/2020/9:12:25 AM    Final      Assessment & Plan:  Plan  I am having Iva Boop maintain her MultiSource Calcium Mag/D, multivitamin with minerals, amLODipine, atorvastatin, aspirin, Calcium-Magnesium-Vitamin D (CALCIUM MAGNESIUM PO), and losartan.  No orders of the defined types were placed in this encounter.   Problem List Items Addressed This Visit    None    Visit Diagnoses     Ataxia S/P CVA    -  Primary    advised pt to go to [facility] office and may need disability lawyer as well  F/u as previously scheduled   Follow-up: Return if symptoms worsen or fail to improve.  [name], DO

## 2020-04-13 NOTE — Patient Instructions (Signed)
DASH Eating Plan DASH stands for "Dietary Approaches to Stop Hypertension." The DASH eating plan is a healthy eating plan that has been shown to reduce high blood pressure (hypertension). It may also reduce your risk for type 2 diabetes, heart disease, and stroke. The DASH eating plan may also help with weight loss. What are tips for following this plan?  General guidelines  Avoid eating more than 2,300 mg (milligrams) of salt (sodium) a day. If you have hypertension, you may need to reduce your sodium intake to 1,500 mg a day.  Limit alcohol intake to no more than 1 drink a day for nonpregnant women and 2 drinks a day for men. One drink equals 12 oz of beer, 5 oz of wine, or 1 oz of hard liquor.  Work with your health care provider to maintain a healthy body weight or to lose weight. Ask what an ideal weight is for you.  Get at least 30 minutes of exercise that causes your heart to beat faster (aerobic exercise) most days of the week. Activities may include walking, swimming, or biking.  Work with your health care provider or diet and nutrition specialist (dietitian) to adjust your eating plan to your individual calorie needs. Reading food labels   Check food labels for the amount of sodium per serving. Choose foods with less than 5 percent of the Daily Value of sodium. Generally, foods with less than 300 mg of sodium per serving fit into this eating plan.  To find whole grains, look for the word "whole" as the first word in the ingredient list. Shopping  Buy products labeled as "low-sodium" or "no salt added."  Buy fresh foods. Avoid canned foods and premade or frozen meals. Cooking  Avoid adding salt when cooking. Use salt-free seasonings or herbs instead of table salt or sea salt. Check with your health care provider or pharmacist before using salt substitutes.  Do not fry foods. Cook foods using healthy methods such as baking, boiling, grilling, and broiling instead.  Cook with  heart-healthy oils, such as olive, canola, soybean, or sunflower oil. Meal planning  Eat a balanced diet that includes: ? 5 or more servings of fruits and vegetables each day. At each meal, try to fill half of your plate with fruits and vegetables. ? Up to 6-8 servings of whole grains each day. ? Less than 6 oz of lean meat, poultry, or fish each day. A 3-oz serving of meat is about the same size as a deck of cards. One egg equals 1 oz. ? 2 servings of low-fat dairy each day. ? A serving of nuts, seeds, or beans 5 times each week. ? Heart-healthy fats. Healthy fats called Omega-3 fatty acids are found in foods such as flaxseeds and coldwater fish, like sardines, salmon, and mackerel.  Limit how much you eat of the following: ? Canned or prepackaged foods. ? Food that is high in trans fat, such as fried foods. ? Food that is high in saturated fat, such as fatty meat. ? Sweets, desserts, sugary drinks, and other foods with added sugar. ? Full-fat dairy products.  Do not salt foods before eating.  Try to eat at least 2 vegetarian meals each week.  Eat more home-cooked food and less restaurant, buffet, and fast food.  When eating at a restaurant, ask that your food be prepared with less salt or no salt, if possible. What foods are recommended? The items listed may not be a complete list. Talk with your dietitian about   what dietary choices are best for you. Grains Whole-grain or whole-wheat bread. Whole-grain or whole-wheat pasta. Brown rice. Oatmeal. Quinoa. Bulgur. Whole-grain and low-sodium cereals. Pita bread. Low-fat, low-sodium crackers. Whole-wheat flour tortillas. Vegetables Fresh or frozen vegetables (raw, steamed, roasted, or grilled). Low-sodium or reduced-sodium tomato and vegetable juice. Low-sodium or reduced-sodium tomato sauce and tomato paste. Low-sodium or reduced-sodium canned vegetables. Fruits All fresh, dried, or frozen fruit. Canned fruit in natural juice (without  added sugar). Meat and other protein foods Skinless chicken or turkey. Ground chicken or turkey. Pork with fat trimmed off. Fish and seafood. Egg whites. Dried beans, peas, or lentils. Unsalted nuts, nut butters, and seeds. Unsalted canned beans. Lean cuts of beef with fat trimmed off. Low-sodium, lean deli meat. Dairy Low-fat (1%) or fat-free (skim) milk. Fat-free, low-fat, or reduced-fat cheeses. Nonfat, low-sodium ricotta or cottage cheese. Low-fat or nonfat yogurt. Low-fat, low-sodium cheese. Fats and oils Soft margarine without trans fats. Vegetable oil. Low-fat, reduced-fat, or light mayonnaise and salad dressings (reduced-sodium). Canola, safflower, olive, soybean, and sunflower oils. Avocado. Seasoning and other foods Herbs. Spices. Seasoning mixes without salt. Unsalted popcorn and pretzels. Fat-free sweets. What foods are not recommended? The items listed may not be a complete list. Talk with your dietitian about what dietary choices are best for you. Grains Baked goods made with fat, such as croissants, muffins, or some breads. Dry pasta or rice meal packs. Vegetables Creamed or fried vegetables. Vegetables in a cheese sauce. Regular canned vegetables (not low-sodium or reduced-sodium). Regular canned tomato sauce and paste (not low-sodium or reduced-sodium). Regular tomato and vegetable juice (not low-sodium or reduced-sodium). Pickles. Olives. Fruits Canned fruit in a light or heavy syrup. Fried fruit. Fruit in cream or butter sauce. Meat and other protein foods Fatty cuts of meat. Ribs. Fried meat. Bacon. Sausage. Bologna and other processed lunch meats. Salami. Fatback. Hotdogs. Bratwurst. Salted nuts and seeds. Canned beans with added salt. Canned or smoked fish. Whole eggs or egg yolks. Chicken or turkey with skin. Dairy Whole or 2% milk, cream, and half-and-half. Whole or full-fat cream cheese. Whole-fat or sweetened yogurt. Full-fat cheese. Nondairy creamers. Whipped toppings.  Processed cheese and cheese spreads. Fats and oils Butter. Stick margarine. Lard. Shortening. Ghee. Bacon fat. Tropical oils, such as coconut, palm kernel, or palm oil. Seasoning and other foods Salted popcorn and pretzels. Onion salt, garlic salt, seasoned salt, table salt, and sea salt. Worcestershire sauce. Tartar sauce. Barbecue sauce. Teriyaki sauce. Soy sauce, including reduced-sodium. Steak sauce. Canned and packaged gravies. Fish sauce. Oyster sauce. Cocktail sauce. Horseradish that you find on the shelf. Ketchup. Mustard. Meat flavorings and tenderizers. Bouillon cubes. Hot sauce and Tabasco sauce. Premade or packaged marinades. Premade or packaged taco seasonings. Relishes. Regular salad dressings. Where to find more information:  National Heart, Lung, and Blood Institute: www.nhlbi.nih.gov  American Heart Association: www.heart.org Summary  The DASH eating plan is a healthy eating plan that has been shown to reduce high blood pressure (hypertension). It may also reduce your risk for type 2 diabetes, heart disease, and stroke.  With the DASH eating plan, you should limit salt (sodium) intake to 2,300 mg a day. If you have hypertension, you may need to reduce your sodium intake to 1,500 mg a day.  When on the DASH eating plan, aim to eat more fresh fruits and vegetables, whole grains, lean proteins, low-fat dairy, and heart-healthy fats.  Work with your health care provider or diet and nutrition specialist (dietitian) to adjust your eating plan to your   individual calorie needs. This information is not intended to replace advice given to you by your health care provider. Make sure you discuss any questions you have with your health care provider. Document Revised: 09/15/2017 Document Reviewed: 09/26/2016 Elsevier Patient Education  2020 Elsevier Inc.  

## 2020-05-11 ENCOUNTER — Ambulatory Visit (INDEPENDENT_AMBULATORY_CARE_PROVIDER_SITE_OTHER): Payer: No Typology Code available for payment source | Admitting: *Deleted

## 2020-05-11 DIAGNOSIS — I639 Cerebral infarction, unspecified: Secondary | ICD-10-CM

## 2020-05-12 ENCOUNTER — Other Ambulatory Visit: Payer: Self-pay

## 2020-05-12 LAB — CUP PACEART REMOTE DEVICE CHECK
Date Time Interrogation Session: 20210725230553
Implantable Pulse Generator Implant Date: 20210520

## 2020-05-12 NOTE — Patient Outreach (Signed)
[facility]) Care Management  05/12/2020  Judy Gutierrez 03/09/1961 [id number]  Telephone outreach to patient to obtain mRS was successfully completed. MRS=1  [name] [facility] Management Assistant [phone]

## 2020-05-14 NOTE — Progress Notes (Signed)
Carelink Summary Report / Loop Recorder 

## 2020-06-01 ENCOUNTER — Ambulatory Visit (INDEPENDENT_AMBULATORY_CARE_PROVIDER_SITE_OTHER): Payer: No Typology Code available for payment source | Admitting: Family Medicine

## 2020-06-01 ENCOUNTER — Other Ambulatory Visit: Payer: Self-pay

## 2020-06-01 ENCOUNTER — Encounter: Payer: Self-pay | Admitting: Family Medicine

## 2020-06-01 VITALS — BP 111/70 | HR 85 | Temp 99.0°F | Resp 13 | Ht 62.0 in | Wt 126.4 lb

## 2020-06-01 DIAGNOSIS — E785 Hyperlipidemia, unspecified: Secondary | ICD-10-CM | POA: Insufficient documentation

## 2020-06-01 DIAGNOSIS — I10 Essential (primary) hypertension: Secondary | ICD-10-CM

## 2020-06-01 NOTE — Patient Instructions (Signed)
DASH Eating Plan DASH stands for "Dietary Approaches to Stop Hypertension." The DASH eating plan is a healthy eating plan that has been shown to reduce high blood pressure (hypertension). It may also reduce your risk for type 2 diabetes, heart disease, and stroke. The DASH eating plan may also help with weight loss. What are tips for following this plan?  General guidelines  Avoid eating more than 2,300 mg (milligrams) of salt (sodium) a day. If you have hypertension, you may need to reduce your sodium intake to 1,500 mg a day.  Limit alcohol intake to no more than 1 drink a day for nonpregnant women and 2 drinks a day for men. One drink equals 12 oz of beer, 5 oz of wine, or 1 oz of hard liquor.  Work with your health care provider to maintain a healthy body weight or to lose weight. Ask what an ideal weight is for you.  Get at least 30 minutes of exercise that causes your heart to beat faster (aerobic exercise) most days of the week. Activities may include walking, swimming, or biking.  Work with your health care provider or diet and nutrition specialist (dietitian) to adjust your eating plan to your individual calorie needs. Reading food labels   Check food labels for the amount of sodium per serving. Choose foods with less than 5 percent of the Daily Value of sodium. Generally, foods with less than 300 mg of sodium per serving fit into this eating plan.  To find whole grains, look for the word "whole" as the first word in the ingredient list. Shopping  Buy products labeled as "low-sodium" or "no salt added."  Buy fresh foods. Avoid canned foods and premade or frozen meals. Cooking  Avoid adding salt when cooking. Use salt-free seasonings or herbs instead of table salt or sea salt. Check with your health care provider or pharmacist before using salt substitutes.  Do not fry foods. Cook foods using healthy methods such as baking, boiling, grilling, and broiling instead.  Cook with  heart-healthy oils, such as olive, canola, soybean, or sunflower oil. Meal planning  Eat a balanced diet that includes: ? 5 or more servings of fruits and vegetables each day. At each meal, try to fill half of your plate with fruits and vegetables. ? Up to 6-8 servings of whole grains each day. ? Less than 6 oz of lean meat, poultry, or fish each day. A 3-oz serving of meat is about the same size as a deck of cards. One egg equals 1 oz. ? 2 servings of low-fat dairy each day. ? A serving of nuts, seeds, or beans 5 times each week. ? Heart-healthy fats. Healthy fats called Omega-3 fatty acids are found in foods such as flaxseeds and coldwater fish, like sardines, salmon, and mackerel.  Limit how much you eat of the following: ? Canned or prepackaged foods. ? Food that is high in trans fat, such as fried foods. ? Food that is high in saturated fat, such as fatty meat. ? Sweets, desserts, sugary drinks, and other foods with added sugar. ? Full-fat dairy products.  Do not salt foods before eating.  Try to eat at least 2 vegetarian meals each week.  Eat more home-cooked food and less restaurant, buffet, and fast food.  When eating at a restaurant, ask that your food be prepared with less salt or no salt, if possible. What foods are recommended? The items listed may not be a complete list. Talk with your dietitian about   what dietary choices are best for you. Grains Whole-grain or whole-wheat bread. Whole-grain or whole-wheat pasta. Brown rice. Oatmeal. Quinoa. Bulgur. Whole-grain and low-sodium cereals. Pita bread. Low-fat, low-sodium crackers. Whole-wheat flour tortillas. Vegetables Fresh or frozen vegetables (raw, steamed, roasted, or grilled). Low-sodium or reduced-sodium tomato and vegetable juice. Low-sodium or reduced-sodium tomato sauce and tomato paste. Low-sodium or reduced-sodium canned vegetables. Fruits All fresh, dried, or frozen fruit. Canned fruit in natural juice (without  added sugar). Meat and other protein foods Skinless chicken or turkey. Ground chicken or turkey. Pork with fat trimmed off. Fish and seafood. Egg whites. Dried beans, peas, or lentils. Unsalted nuts, nut butters, and seeds. Unsalted canned beans. Lean cuts of beef with fat trimmed off. Low-sodium, lean deli meat. Dairy Low-fat (1%) or fat-free (skim) milk. Fat-free, low-fat, or reduced-fat cheeses. Nonfat, low-sodium ricotta or cottage cheese. Low-fat or nonfat yogurt. Low-fat, low-sodium cheese. Fats and oils Soft margarine without trans fats. Vegetable oil. Low-fat, reduced-fat, or light mayonnaise and salad dressings (reduced-sodium). Canola, safflower, olive, soybean, and sunflower oils. Avocado. Seasoning and other foods Herbs. Spices. Seasoning mixes without salt. Unsalted popcorn and pretzels. Fat-free sweets. What foods are not recommended? The items listed may not be a complete list. Talk with your dietitian about what dietary choices are best for you. Grains Baked goods made with fat, such as croissants, muffins, or some breads. Dry pasta or rice meal packs. Vegetables Creamed or fried vegetables. Vegetables in a cheese sauce. Regular canned vegetables (not low-sodium or reduced-sodium). Regular canned tomato sauce and paste (not low-sodium or reduced-sodium). Regular tomato and vegetable juice (not low-sodium or reduced-sodium). Pickles. Olives. Fruits Canned fruit in a light or heavy syrup. Fried fruit. Fruit in cream or butter sauce. Meat and other protein foods Fatty cuts of meat. Ribs. Fried meat. Bacon. Sausage. Bologna and other processed lunch meats. Salami. Fatback. Hotdogs. Bratwurst. Salted nuts and seeds. Canned beans with added salt. Canned or smoked fish. Whole eggs or egg yolks. Chicken or turkey with skin. Dairy Whole or 2% milk, cream, and half-and-half. Whole or full-fat cream cheese. Whole-fat or sweetened yogurt. Full-fat cheese. Nondairy creamers. Whipped toppings.  Processed cheese and cheese spreads. Fats and oils Butter. Stick margarine. Lard. Shortening. Ghee. Bacon fat. Tropical oils, such as coconut, palm kernel, or palm oil. Seasoning and other foods Salted popcorn and pretzels. Onion salt, garlic salt, seasoned salt, table salt, and sea salt. Worcestershire sauce. Tartar sauce. Barbecue sauce. Teriyaki sauce. Soy sauce, including reduced-sodium. Steak sauce. Canned and packaged gravies. Fish sauce. Oyster sauce. Cocktail sauce. Horseradish that you find on the shelf. Ketchup. Mustard. Meat flavorings and tenderizers. Bouillon cubes. Hot sauce and Tabasco sauce. Premade or packaged marinades. Premade or packaged taco seasonings. Relishes. Regular salad dressings. Where to find more information:  National Heart, Lung, and Blood Institute: www.nhlbi.nih.gov  American Heart Association: www.heart.org Summary  The DASH eating plan is a healthy eating plan that has been shown to reduce high blood pressure (hypertension). It may also reduce your risk for type 2 diabetes, heart disease, and stroke.  With the DASH eating plan, you should limit salt (sodium) intake to 2,300 mg a day. If you have hypertension, you may need to reduce your sodium intake to 1,500 mg a day.  When on the DASH eating plan, aim to eat more fresh fruits and vegetables, whole grains, lean proteins, low-fat dairy, and heart-healthy fats.  Work with your health care provider or diet and nutrition specialist (dietitian) to adjust your eating plan to your   individual calorie needs. This information is not intended to replace advice given to you by your health care provider. Make sure you discuss any questions you have with your health care provider. Document Revised: 09/15/2017 Document Reviewed: 09/26/2016 Elsevier Patient Education  2020 Elsevier Inc.  

## 2020-06-01 NOTE — Progress Notes (Signed)
Patient ID: Judy Gutierrez, female    DOB: September 19, 1961  Age: 59 y.o. MRN: [id number]    Subjective:  Subjective  HPI Judy Gutierrez presents for f/u bp and cholesterol.   No complaints  Review of Systems  Constitutional: Negative for activity change, appetite change, fatigue and unexpected weight change.  Respiratory: Negative for cough and shortness of breath.   Cardiovascular: Negative for chest pain and palpitations.  Psychiatric/Behavioral: Negative for behavioral problems and dysphoric mood. The patient is not nervous/anxious.     History Past Medical History:  Diagnosis Date  . Disturbance of skin sensation   . Myalgia and myositis, unspecified   . Other B-complex deficiencies   . 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 Stroke (HCC)   . Unspecified essential hypertension     She has a past surgical history that includes Cesarean section (1992); LOOP RECORDER INSERTION (N/A, 03/05/2020); TEE without cardioversion (N/A, 03/05/2020); and Bubble study (03/05/2020).   Her family history includes Diabetes in her father; Diabetes Mellitus II in her sister; Hepatitis in her father; Pulmonary embolism in her mother.She reports that she has never smoked. She has never used smokeless tobacco. She reports that she does not drink alcohol and does not use drugs.  Current Outpatient Medications on File Prior to Visit  Medication Sig Dispense Refill  . amLODipine (NORVASC) 5 MG tablet Take 1 tablet (5 mg total) by mouth daily. 90 tablet 1  . aspirin 81 MG EC tablet Take 1 tablet (81 mg total) by mouth daily. 90 tablet 1  . atorvastatin (LIPITOR) 40 MG tablet Take 1 tablet (40 mg total) by mouth daily. 90 tablet 1  . Calcium-Magnesium-Vitamin D (CALCIUM MAGNESIUM PO) Take by mouth daily.    Marland Kitchen losartan (COZAAR) 50 MG tablet Take 50 mg by mouth daily.    . Multiple Minerals-Vitamins (MULTISOURCE CALCIUM MAG/D) TABS Take 1 tablet by mouth daily.    . Multiple Vitamin (MULTIVITAMIN WITH  MINERALS) TABS tablet Take 1 tablet by mouth daily.     No current facility-administered medications on file prior to visit.     Objective:  Objective  Physical Exam Vitals and nursing note reviewed.  Constitutional:      Appearance: She is well-developed.  HENT:     Head: Normocephalic and atraumatic.  Eyes:     Conjunctiva/sclera: Conjunctivae normal.  Neck:     Thyroid: No thyromegaly.     Vascular: No carotid bruit or JVD.  Cardiovascular:     Rate and Rhythm: Normal rate and regular rhythm.     Heart sounds: Normal heart sounds. No murmur heard.   Pulmonary:     Effort: Pulmonary effort is normal. No respiratory distress.     Breath sounds: Normal breath sounds. No wheezing or rales.  Chest:     Chest wall: No tenderness.  Musculoskeletal:     Cervical back: Normal range of motion and neck supple.  Neurological:     Mental Status: She is alert and oriented to person, place, and time.    BP 111/70 (BP Location: Right Arm, Patient Position: Sitting, Cuff Size: Normal)   Pulse 85   Temp 99 F (37.2 C) (Oral)   Resp 13   Ht 5\' 2"  (1.575 m)   Wt 126 lb 6.4 oz (57.3 kg)   LMP 12/08/2010   SpO2 99%   BMI 23.12 kg/m  Wt Readings from Last 3 Encounters:  06/01/20 126 lb 6.4 oz (57.3 kg)  04/13/20 126 lb (57.2 kg)  03/05/20 128 lb (58.1 kg)     Lab Results  Component Value Date   WBC 6.2 02/01/2020   HGB 12.4 02/01/2020   HCT 39.5 02/01/2020   PLT 287 02/01/2020   GLUCOSE 114 (H) 02/04/2020   CHOL 202 (H) 01/30/2020   TRIG 27 01/30/2020   HDL 59 01/30/2020   LDLCALC 138 (H) 01/30/2020   ALT 29 01/29/2020   AST 42 (H) 01/29/2020   NA 137 02/04/2020   K 4.1 02/04/2020   CL 101 02/04/2020   CREATININE 0.63 02/04/2020   BUN 19 02/04/2020   CO2 29 02/04/2020   TSH 3.20 06/28/2018   INR 1.0 01/29/2020   HGBA1C 6.0 (H) 01/30/2020    ECHO TEE  Result Date: 03/05/2020    TRANSESOPHOGEAL ECHO REPORT   Patient Name:   Judy Gutierrez Date of Exam: 03/05/2020  Medical Rec #:  [id number]   Height:       62.0 in Accession #:    [id number]  Weight:       128.0 lb Date of Birth:  02/22/1961  BSA:          1.581 m Patient Age:    58 years    BP:           170/79 mmHg Patient Gender: F           HR:           68 bpm. Exam Location:  Inpatient Procedure: Transesophageal Echo, Color Doppler, Cardiac Doppler and Saline            Contrast Bubble Study Indications:     Stroke i163.9  History:         Patient has prior history of Echocardiogram examinations, most                  recent 01/30/2020. Risk Factors:Hypertension.  Sonographer:     Irving Burton Senior RDCS Referring Phys:  [id number] [name] Diagnosing Phys: Epifanio Lesches MD PROCEDURE: After discussion of the risks and benefits of a TEE, an informed consent was obtained from the patient. The transesophogeal probe was passed without difficulty through the esophogus of the patient. Sedation performed by different physician. The patient was monitored while under deep sedation. Anesthestetic sedation was provided intravenously by Anesthesiology: 224mg  of Propofol. The patient developed no complications during the procedure. IMPRESSIONS  1. Left ventricular ejection fraction, by estimation, is 60 to 65%. The left ventricle has normal function. The left ventricle has no regional wall motion abnormalities.  2. Right ventricular systolic function is normal. The right ventricular size is normal.  3. No left atrial/left atrial appendage thrombus was detected.  4. The mitral valve is normal in structure. Trivial mitral valve regurgitation.  5. The aortic valve is tricuspid. Aortic valve regurgitation is trivial. No aortic stenosis is present.  6. Agitated saline contrast bubble study was negative, with no evidence of any interatrial shunt. FINDINGS  Left Ventricle: Left ventricular ejection fraction, by estimation, is 60 to 65%. The left ventricle has normal function. The left ventricle has no regional wall motion  abnormalities. The left ventricular internal cavity size was normal in size. There is  no left ventricular hypertrophy. Right Ventricle: The right ventricular size is normal. Right vetricular wall thickness was not assessed. Right ventricular systolic function is normal. Left Atrium: Left atrial size was normal in size. No left atrial/left atrial appendage thrombus was detected. Right Atrium: Right atrial size was normal in size. Pericardium: There is no  evidence of pericardial effusion. Mitral Valve: The mitral valve is normal in structure. Trivial mitral valve regurgitation. Tricuspid Valve: The tricuspid valve is normal in structure. Tricuspid valve regurgitation is mild. Aortic Valve: The aortic valve is tricuspid. Aortic valve regurgitation is trivial. No aortic stenosis is present. Pulmonic Valve: The pulmonic valve was grossly normal. Pulmonic valve regurgitation is trivial. Aorta: The aortic root is normal in size and structure. IAS/Shunts: No atrial level shunt detected by color flow Doppler. Agitated saline contrast was given intravenously to evaluate for intracardiac shunting. Agitated saline contrast bubble study was negative, with no evidence of any interatrial shunt. Epifanio Lesches MD Electronically signed by Epifanio Lesches MD Signature Date/Time: 03/05/2020/9:12:25 AM    Final      Assessment & Plan:  Plan  I am having Iva Boop maintain her [employer] Calcium Mag/D, multivitamin with minerals, amLODipine, atorvastatin, aspirin, Calcium-Magnesium-Vitamin D (CALCIUM MAGNESIUM PO), and losartan.  No orders of the defined types were placed in this encounter.   Problem List Items Addressed This Visit      Unprioritized   Dyslipidemia    Tolerating statin, encouraged heart healthy diet, avoid trans fats, minimize simple carbs and saturated fats. Increase exercise as tolerated      Relevant Orders   Lipid panel   Comprehensive metabolic panel   Essential hypertension -  Primary    Well controlled, no changes to meds. Encouraged heart healthy diet such as the DASH diet and exercise as tolerated.       Relevant Orders   Lipid panel   Comprehensive metabolic panel      Follow-up: Return in about 6 months (around 12/02/2020) for annual exam, fasting.  [name], DO

## 2020-06-01 NOTE — Assessment & Plan Note (Signed)
Tolerating statin, encouraged heart healthy diet, avoid trans fats, minimize simple carbs and saturated fats. Increase exercise as tolerated 

## 2020-06-01 NOTE — Assessment & Plan Note (Signed)
Well controlled, no changes to meds. Encouraged heart healthy diet such as the DASH diet and exercise as tolerated.  °

## 2020-06-02 ENCOUNTER — Encounter: Payer: Self-pay | Admitting: Family Medicine

## 2020-06-02 LAB — COMPREHENSIVE METABOLIC PANEL
ALT: 35 U/L (ref 0–35)
AST: 34 U/L (ref 0–37)
Albumin: 4 g/dL (ref 3.5–5.2)
Alkaline Phosphatase: 98 U/L (ref 39–117)
BUN: 14 mg/dL (ref 6–23)
CO2: 29 mEq/L (ref 19–32)
Calcium: 9.4 mg/dL (ref 8.4–10.5)
Chloride: 103 mEq/L (ref 96–112)
Creatinine, Ser: 0.6 mg/dL (ref 0.40–1.20)
GFR: 102.41 mL/min (ref >60.00)
Glucose, Bld: 118 mg/dL — ABNORMAL HIGH (ref 70–99)
Potassium: 4.1 mEq/L (ref 3.5–5.1)
Sodium: 137 mEq/L (ref 135–145)
Total Bilirubin: 1.1 mg/dL (ref 0.2–1.2)
Total Protein: 7.4 g/dL (ref 6.0–8.3)

## 2020-06-02 LAB — LIPID PANEL
Cholesterol: 107 mg/dL (ref 0–200)
HDL: 46.6 mg/dL (ref >39.00)
LDL Cholesterol: 51 mg/dL (ref 0–99)
NonHDL: 60.65
Total CHOL/HDL Ratio: 2
Triglycerides: 48 mg/dL (ref 0.0–149.0)
VLDL: 9.6 mg/dL (ref 0.0–40.0)

## 2020-06-10 ENCOUNTER — Telehealth: Payer: Self-pay | Admitting: Family Medicine

## 2020-06-10 MED ORDER — LOSARTAN POTASSIUM 50 MG PO TABS: 50.0000 mg | ORAL_TABLET | Freq: Every day | ORAL | 2 refills | Status: DC

## 2020-06-10 NOTE — Telephone Encounter (Signed)
Refill sent.

## 2020-06-10 NOTE — Telephone Encounter (Signed)
New Message:   1.Medication Requested: losartan (COZAAR) 50 MG tablet 2. Pharmacy (Name, Street, [place]): [phone] - [place], [place] - [address] 3. On Med List: Yes  4. Last Visit with PCP: 06/01/20  5. Next visit date with PCP: 12/07/20   Agent: Please be advised that RX refills may take up to 3 business days. We ask that you follow-up with your pharmacy.

## 2020-06-15 ENCOUNTER — Ambulatory Visit (INDEPENDENT_AMBULATORY_CARE_PROVIDER_SITE_OTHER): Payer: No Typology Code available for payment source | Admitting: *Deleted

## 2020-06-15 DIAGNOSIS — I639 Cerebral infarction, unspecified: Secondary | ICD-10-CM

## 2020-06-16 ENCOUNTER — Other Ambulatory Visit: Payer: Self-pay | Admitting: Family Medicine

## 2020-06-16 DIAGNOSIS — Z1231 Encounter for screening mammogram for malignant neoplasm of breast: Secondary | ICD-10-CM

## 2020-06-16 LAB — CUP PACEART REMOTE DEVICE CHECK
Date Time Interrogation Session: 20210827230547
Implantable Pulse Generator Implant Date: 20210520

## 2020-06-17 ENCOUNTER — Telehealth: Payer: Self-pay | Admitting: Internal Medicine

## 2020-06-17 NOTE — Telephone Encounter (Signed)
Called and explained to patient that everything looked good on her report.   Casimiro Needle [address] [place], PA-C  06/17/2020 10:42 AM

## 2020-06-17 NOTE — Telephone Encounter (Signed)
     1. Has your device fired?   2. Is you device beeping?   3. Are you experiencing draining or swelling at device site?   4. Are you calling to see if we received your device transmission?   5. Have you passed out?   Pt said she saw on her mychart results for her cup paceart remote device check. She couldn't understand and just wanted to make sure that everything is ok. She wants to talk to someone or RN Arline Asp to explain it to her.   Please route to Device Clinic Pool

## 2020-06-17 NOTE — Progress Notes (Signed)
Carelink Summary Report / Loop Recorder 

## 2020-07-01 ENCOUNTER — Ambulatory Visit: Payer: No Typology Code available for payment source

## 2020-07-20 ENCOUNTER — Ambulatory Visit (INDEPENDENT_AMBULATORY_CARE_PROVIDER_SITE_OTHER): Payer: No Typology Code available for payment source

## 2020-07-20 DIAGNOSIS — I639 Cerebral infarction, unspecified: Secondary | ICD-10-CM | POA: Diagnosis not present

## 2020-07-20 LAB — CUP PACEART REMOTE DEVICE CHECK
Date Time Interrogation Session: 20210929230540
Implantable Pulse Generator Implant Date: 20210520

## 2020-07-22 NOTE — Progress Notes (Signed)
Carelink Summary Report / Loop Recorder 

## 2020-07-27 DIAGNOSIS — Z0271 Encounter for disability determination: Secondary | ICD-10-CM

## 2020-08-03 ENCOUNTER — Telehealth: Payer: Self-pay

## 2020-08-03 ENCOUNTER — Other Ambulatory Visit: Payer: Self-pay

## 2020-08-03 ENCOUNTER — Ambulatory Visit
Admission: RE | Admit: 2020-08-03 | Discharge: 2020-08-03 | Disposition: A | Discharge status: Home / Self Care | Payer: No Typology Code available for payment source | Source: Ambulatory Visit | Attending: Family Medicine | Admitting: Family Medicine

## 2020-08-03 DIAGNOSIS — Z1231 Encounter for screening mammogram for malignant neoplasm of breast: Secondary | ICD-10-CM

## 2020-08-03 MED ORDER — LOSARTAN POTASSIUM 50 MG PO TABS: 50.0000 mg | ORAL_TABLET | Freq: Every day | ORAL | 2 refills | Status: DC

## 2020-08-03 NOTE — Telephone Encounter (Signed)
Nurse Assessment Nurse: Doylene Canard, RN, Rinaldo Cloud Date/Time (Eastern Time): 08/01/2020 7:34:46 AM Confirm and document reason for call. If symptomatic, describe symptoms. ---Caller states she's out of losartan potassium 50mg  q day. Does the patient have any new or worsening symptoms? ---No Please document clinical information provided and list any resource used. ---Will call amount sufficient to last until office reopens per client directive. Disp. Time Time) Disposition Final User 08/01/2020 7:41:00 AM Pharmacy Call 08/03/2020, RN, Doylene Canard Reason: to order medication 08/01/2020 7:40:05 AM Clinical Call Yes 08/03/2020, RN, Doylene Canard Comments User: Rinaldo Cloud, RN Date/Time Hassan Rowan Time): 08/01/2020 7:40:28 AM [facility] [phone]   Rx sent.

## 2020-08-19 ENCOUNTER — Ambulatory Visit (INDEPENDENT_AMBULATORY_CARE_PROVIDER_SITE_OTHER): Payer: No Typology Code available for payment source | Admitting: Neurology

## 2020-08-19 ENCOUNTER — Encounter: Payer: Self-pay | Admitting: Neurology

## 2020-08-19 ENCOUNTER — Other Ambulatory Visit: Payer: Self-pay

## 2020-08-19 VITALS — BP 134/68 | HR 70 | Ht 62.0 in | Wt 129.4 lb

## 2020-08-19 DIAGNOSIS — I6990 Unspecified sequelae of unspecified cerebrovascular disease: Secondary | ICD-10-CM

## 2020-08-19 NOTE — Patient Instructions (Signed)
I had a long d/w patient about her recent stroke, risk for recurrent stroke/TIAs, personally independently reviewed imaging studies and stroke evaluation results and answered questions.Continue aspirin 81 mg daily  for secondary stroke prevention and maintain strict control of hypertension with blood pressure goal below 130/90, diabetes with hemoglobin A1c goal below 6.5% and lipids with LDL cholesterol goal below 70 mg/dL. I also advised the patient to eat a healthy diet with plenty of whole grains, cereals, fruits and vegetables, exercise regularly and maintain ideal body weight Followup in the future with me in  1 year or call earlier if necessary.  Stroke Prevention Some medical conditions and behaviors are associated with a higher chance of having a stroke. You can help prevent a stroke by making nutrition, lifestyle, and other changes, including managing any medical conditions you may have. What nutrition changes can be made?   Eat healthy foods. You can do this by: ? Choosing foods high in fiber, such as fresh fruits and vegetables and whole grains. ? Eating at least 5 or more servings of fruits and vegetables a day. Try to fill half of your plate at each meal with fruits and vegetables. ? Choosing lean protein foods, such as lean cuts of meat, poultry without skin, fish, tofu, beans, and nuts. ? Eating low-fat dairy products. ? Avoiding foods that are high in salt (sodium). This can help lower blood pressure. ? Avoiding foods that have saturated fat, trans fat, and cholesterol. This can help prevent high cholesterol. ? Avoiding processed and premade foods.  Follow your health care provider's specific guidelines for losing weight, controlling high blood pressure (hypertension), lowering high cholesterol, and managing diabetes. These may include: ? Reducing your daily calorie intake. ? Limiting your daily sodium intake to 1,500 milligrams (mg). ? Using only healthy fats for cooking, such as  olive oil, canola oil, or sunflower oil. ? Counting your daily carbohydrate intake. What lifestyle changes can be made?  Maintain a healthy weight. Talk to your health care provider about your ideal weight.  Get at least 30 minutes of moderate physical activity at least 5 days a week. Moderate activity includes brisk walking, biking, and swimming.  Do not use any products that contain nicotine or tobacco, such as cigarettes and e-cigarettes. If you need help quitting, ask your health care provider. It may also be helpful to avoid exposure to secondhand smoke.  Limit alcohol intake to no more than 1 drink a day for nonpregnant women and 2 drinks a day for men. One drink equals 12 oz of beer, 5 oz of wine, or 1 oz of hard liquor.  Stop any illegal drug use.  Avoid taking birth control pills. Talk to your health care provider about the risks of taking birth control pills if: ? You are over 73 years old. ? You smoke. ? You get migraines. ? You have ever had a blood clot. What other changes can be made?  Manage your cholesterol levels. ? Eating a healthy diet is important for preventing high cholesterol. If cholesterol cannot be managed through diet alone, you may also need to take medicines. ? Take any prescribed medicines to control your cholesterol as told by your health care provider.  Manage your diabetes. ? Eating a healthy diet and exercising regularly are important parts of managing your blood sugar. If your blood sugar cannot be managed through diet and exercise, you may need to take medicines. ? Take any prescribed medicines to control your diabetes as told by  your health care provider.  Control your hypertension. ? To reduce your risk of stroke, try to keep your blood pressure below 130/80. ? Eating a healthy diet and exercising regularly are an important part of controlling your blood pressure. If your blood pressure cannot be managed through diet and exercise, you may need to  take medicines. ? Take any prescribed medicines to control hypertension as told by your health care provider. ? Ask your health care provider if you should monitor your blood pressure at home. ? Have your blood pressure checked every year, even if your blood pressure is normal. Blood pressure increases with age and some medical conditions.  Get evaluated for sleep disorders (sleep apnea). Talk to your health care provider about getting a sleep evaluation if you snore a lot or have excessive sleepiness.  Take over-the-counter and prescription medicines only as told by your health care provider. Aspirin or blood thinners (antiplatelets or anticoagulants) may be recommended to reduce your risk of forming blood clots that can lead to stroke.  Make sure that any other medical conditions you have, such as atrial fibrillation or atherosclerosis, are managed. What are the warning signs of a stroke? The warning signs of a stroke can be easily remembered as BEFAST.  B is for balance. Signs include: ? Dizziness. ? Loss of balance or coordination. ? Sudden trouble walking.  E is for eyes. Signs include: ? A sudden change in vision. ? Trouble seeing.  F is for face. Signs include: ? Sudden weakness or numbness of the face. ? The face or eyelid drooping to one side.  A is for arms. Signs include: ? Sudden weakness or numbness of the arm, usually on one side of the body.  S is for speech. Signs include: ? Trouble speaking (aphasia). ? Trouble understanding.  T is for time. ? These symptoms may represent a serious problem that is an emergency. Do not wait to see if the symptoms will go away. Get medical help right away. Call your local emergency services (911 in the [place] Do not drive yourself to the hospital.  Other signs of stroke may include: ? A sudden, severe headache with no known cause. ? Nausea or vomiting. ? Seizure. Where to find more information For more information,  visit:  American Stroke Association: www.strokeassociation.org  National Stroke Association: www.stroke.org Summary  You can prevent a stroke by eating healthy, exercising, not smoking, limiting alcohol intake, and managing any medical conditions you may have.  Do not use any products that contain nicotine or tobacco, such as cigarettes and e-cigarettes. If you need help quitting, ask your health care provider. It may also be helpful to avoid exposure to secondhand smoke.  Remember BEFAST for warning signs of stroke. Get help right away if you or a loved one has any of these signs. This information is not intended to replace advice given to you by your health care provider. Make sure you discuss any questions you have with your health care provider. Document Revised: 09/15/2017 Document Reviewed: 11/08/2016 Elsevier Patient Education  2020 [employer].

## 2020-08-19 NOTE — Progress Notes (Signed)
[facility] [address] [place]. [place] [address] [phone]       OFFICE FOLLOW-UP NOTE  Ms. Iva Boop Date of Birth:  [birth year] Medical Record Number:  [id number]   HPI: Initial visit 03/04/2020  :Ms. Sutphen is a 59 year old [place] origin lady seen today for initial office follow-up visit following hospital consultation for stroke in April 2021.  She is accompanied by her husband.  History is obtained from them, review of electronic medical records and I personally reviewed imaging films in PACS.  She presented to med [facility] emergency room with sudden onset of dizziness, diplopia, gait imbalance and vomiting.  She was seen by telemetry neurologist and given IV TPA posterior circulation stroke.  She was transferred to [facility].  Initial CT scan of the head was unremarkable but MRI scan showed a small left superior cerebellar as well as medial midbrain infarct.  2D echo showed normal ejection fraction without cardiac source of embolism.  Cardiac monitoring during hospitalization did not show any paroxysmal A. fib.  Transcranial Doppler bubble study was negative for PFO.  LDL cholesterol is elevated 138 mg percent.  Hemoglobin A1c was borderline at 6.0.  Hypercoagulable labs and vasculitic labs were negative.  CT angiogram of the brain and neck showed no significant large vessel stenosis in the neck of the brain.  Patient was discharged home on aspirin and Plavix for 3 weeks and now is currently on aspirin alone.  Patient states she is doing well.  She is getting home physical and occupational therapy.  Her dizziness and gait imbalance have improved completely.  Double vision is also gone.  She had called the office few weeks ago complaining of eye twitching and some numbness on the right side and was wondering if this was related to the transcranial Doppler study that she had had.  The symptoms also have gone.  She is scheduled to undergo TEE  tomorrow followed by loop recorder.  She is tolerating aspirin well without bruising or bleeding.  She is also tolerating Lipitor well without muscle aches and pains.  Her blood pressure is well controlled.  She ran out of Cozaar  3 days ago but yet her blood pressure today is fine at 128/83. Update 08/19/2020 : She returns for follow-up after last visit 6 months ago.  She continues to do well.  She has had no recurrent stroke or TIA symptoms.  She is tolerating aspirin well without bruising or bleeding.  She remains on Lipitor and tolerating it well without muscle aches and pains.  She had follow-up lipid profile checked on 06/01/2020 and LDL cholesterol was 51 mg percent.  She had TEE done on 03/05/2020 which was unremarkable and she underwent loop recorder insertion.  So far monthly analysis has shown few episodes of transient A. fib which were felt to be artifact but no real A. fib was found.  She states she is recovered completely back to normal.  She has no complaints.  Her blood pressures well controlled today it is borderline at 134/68.  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 ROS:   14 system review of systems is positive for no complaints today and all systems negative PMH:  Past Medical History:  Diagnosis Date  . Disturbance of skin sensation   . Myalgia and myositis, unspecified   . Other B-complex deficiencies   . 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  Stroke (HCC)   . Unspecified essential hypertension     Social History:  Social History   Socioeconomic History  . Marital status: Married    Spouse name: Not on file  . Number of children: 1  . Years of education: Not on file  . Highest education level: Not on file  Occupational History  . Occupation: Science writer    Comment: self employed  Tobacco Use  . Smoking status: Never Smoker  . Smokeless tobacco: Never Used  Substance and Sexual  Activity  . Alcohol use: No  . Drug use: No  . Sexual activity: Yes  Other Topics Concern  . Not on file  Social History Narrative   Lives with husband   Right Handed   Drinks caffeine seldom   Social Determinants of Health   Financial Resource Strain:   . Difficulty of Paying Living Expenses: Not on file  Food Insecurity:   . Worried About Programme researcher, broadcasting/film/video in the Last Year: Not on file  . Ran Out of Food in the Last Year: Not on file  Transportation Needs: No Transportation Needs  . Lack of Transportation (Medical): No  . Lack of Transportation (Non-Medical): No  Physical Activity:   . Days of Exercise per Week: Not on file  . Minutes of Exercise per Session: Not on file  Stress:   . Feeling of Stress : Not on file  Social Connections:   . Frequency of Communication with Friends and Family: Not on file  . Frequency of Social Gatherings with Friends and Family: Not on file  . Attends Religious Services: Not on file  . Active Member of Clubs or Organizations: Not on file  . Attends Banker Meetings: Not on file  . Marital Status: Not on file  Intimate Partner Violence:   . Fear of Current or Ex-Partner: Not on file  . Emotionally Abused: Not on file  . Physically Abused: Not on file  . Sexually Abused: Not on file    Medications:   Current Outpatient Medications on File Prior to Visit  Medication Sig Dispense Refill  . amLODipine (NORVASC) 5 MG tablet Take 1 tablet (5 mg total) by mouth daily. 90 tablet 1  . aspirin 81 MG EC tablet Take 1 tablet (81 mg total) by mouth daily. 90 tablet 1  . atorvastatin (LIPITOR) 40 MG tablet Take 1 tablet (40 mg total) by mouth daily. 90 tablet 1  . Calcium-Magnesium-Vitamin D (CALCIUM MAGNESIUM PO) Take by mouth daily.    Marland Kitchen losartan (COZAAR) 50 MG tablet Take 1 tablet (50 mg total) by mouth daily. 30 tablet 2  . Multiple Minerals-Vitamins (MULTISOURCE CALCIUM MAG/D) TABS Take 1 tablet by mouth daily.    . Multiple  Vitamin (MULTIVITAMIN WITH MINERALS) TABS tablet Take 1 tablet by mouth daily.     No current facility-administered medications on file prior to visit.    Allergies:  No Known Allergies  Physical Exam General: Frail middle-aged Caucasian lady seated, in no evident distress Head: head normocephalic and atraumatic.  Neck: supple with no carotid or supraclavicular bruits Cardiovascular: regular rate and rhythm, no murmurs Musculoskeletal: no deformity Skin:  no rash/petichiae Vascular:  Normal pulses all extremities Vitals:   08/19/20 1404  BP: 134/68  Pulse: 70   Neurologic Exam Mental Status: Awake and fully alert. Oriented to place and time. Recent and remote memory intact. Attention span, concentration and fund of knowledge appropriate. Mood and affect appropriate.  Cranial Nerves: Fundoscopic exam  reveals sharp disc margins. Pupils equal, briskly reactive to light. Extraocular movements full without nystagmus. Visual fields full to confrontation. Hearing intact. Facial sensation intact. Face, tongue, palate moves normally and symmetrically.  Motor: Normal bulk and tone. Normal strength in all tested extremity muscles. Sensory.: intact to touch ,pinprick .position and vibratory sensation.  Coordination: Rapid alternating movements normal in all extremities. Finger-to-nose and heel-to-shin performed accurately bilaterally. Gait and Station: Arises from chair without difficulty. Stance is normal. Gait demonstrates normal stride length and balance . Able to heel, toe and tandem walk without difficulty.  Reflexes: 1+ and symmetric. Toes downgoing.       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  Vascular risk factors of hyperlipidemia only     PLAN: I had a long d/w patient about her recent stroke, risk for recurrent stroke/TIAs, personally independently reviewed imaging studies and  stroke evaluation results and answered questions.Continue aspirin 81 mg daily  for secondary stroke prevention and maintain strict control of hypertension with blood pressure goal below 130/90, diabetes with hemoglobin A1c goal below 6.5% and lipids with LDL cholesterol goal below 70 mg/dL. I also advised the patient to eat a healthy diet with plenty of whole grains, cereals, fruits and vegetables, exercise regularly and maintain ideal body weight Followup in the future with me in  1 year or call earlier if necessary. Greater than 50% of time during this 25 minute visit was spent on counseling,explanation of diagnosis, planning of further management, discussion with patient and family and coordination of care Delia Heady, MD  [facility] [address] [place], [place] [address]  Phone [phone] Fax [phone] Note: This document was prepared with digital dictation and possible smart phrase technology. Any transcriptional errors that result from this process are unintentional

## 2020-08-21 LAB — CUP PACEART REMOTE DEVICE CHECK
Date Time Interrogation Session: 20211101230342
Implantable Pulse Generator Implant Date: 20210520

## 2020-08-24 ENCOUNTER — Ambulatory Visit (INDEPENDENT_AMBULATORY_CARE_PROVIDER_SITE_OTHER): Payer: No Typology Code available for payment source

## 2020-08-24 DIAGNOSIS — I639 Cerebral infarction, unspecified: Secondary | ICD-10-CM

## 2020-08-25 NOTE — Progress Notes (Signed)
Carelink Summary Report / Loop Recorder 

## 2020-09-27 LAB — CUP PACEART REMOTE DEVICE CHECK
Date Time Interrogation Session: 20211204230415
Implantable Pulse Generator Implant Date: 20210520

## 2020-09-28 ENCOUNTER — Ambulatory Visit (INDEPENDENT_AMBULATORY_CARE_PROVIDER_SITE_OTHER): Payer: No Typology Code available for payment source

## 2020-09-28 DIAGNOSIS — I639 Cerebral infarction, unspecified: Secondary | ICD-10-CM | POA: Diagnosis not present

## 2020-10-07 DIAGNOSIS — Z0271 Encounter for disability determination: Secondary | ICD-10-CM

## 2020-10-13 NOTE — Progress Notes (Signed)
Carelink Summary Report / Loop Recorder 

## 2020-11-02 ENCOUNTER — Ambulatory Visit (INDEPENDENT_AMBULATORY_CARE_PROVIDER_SITE_OTHER): Payer: No Typology Code available for payment source

## 2020-11-02 DIAGNOSIS — I639 Cerebral infarction, unspecified: Secondary | ICD-10-CM

## 2020-11-04 LAB — CUP PACEART REMOTE DEVICE CHECK
Date Time Interrogation Session: 20220115230609
Implantable Pulse Generator Implant Date: 20210520

## 2020-11-13 ENCOUNTER — Telehealth: Payer: Self-pay | Admitting: Family Medicine

## 2020-11-13 DIAGNOSIS — E785 Hyperlipidemia, unspecified: Secondary | ICD-10-CM

## 2020-11-13 MED ORDER — ATORVASTATIN CALCIUM 40 MG PO TABS: 40.0000 mg | ORAL_TABLET | Freq: Every day | ORAL | 1 refills | Status: DC

## 2020-11-13 NOTE — Telephone Encounter (Signed)
Medication: atorvastatin (LIPITOR) 40 MG tablet    Has the patient contacted their pharmacy? No. (If no, request that the patient contact the pharmacy for the refill.) (If yes, when and what did the pharmacy advise?)  Preferred Pharmacy (with phone number or street name):  [id number] - [place], [place] - [address]  [address] [place] [address]  Phone:  [phone] Fax:  [phone]  DEA #:  [id number]  DAW Reason: --     Agent: Please be advised that RX refills may take up to 3 business days. We ask that you follow-up with your pharmacy.

## 2020-11-13 NOTE — Telephone Encounter (Signed)
Rx sent 

## 2020-11-17 NOTE — Progress Notes (Signed)
Carelink Summary Report / Loop Recorder 

## 2020-12-03 ENCOUNTER — Other Ambulatory Visit: Payer: Self-pay

## 2020-12-04 ENCOUNTER — Encounter: Payer: Self-pay | Admitting: Family Medicine

## 2020-12-04 ENCOUNTER — Other Ambulatory Visit: Payer: Self-pay

## 2020-12-04 ENCOUNTER — Ambulatory Visit (INDEPENDENT_AMBULATORY_CARE_PROVIDER_SITE_OTHER): Payer: No Typology Code available for payment source | Admitting: Family Medicine

## 2020-12-04 VITALS — BP 132/80 | HR 76 | Temp 99.1°F | Resp 18 | Ht 62.0 in

## 2020-12-04 DIAGNOSIS — Z0000 Encounter for general adult medical examination without abnormal findings: Secondary | ICD-10-CM

## 2020-12-04 DIAGNOSIS — I10 Essential (primary) hypertension: Secondary | ICD-10-CM | POA: Diagnosis not present

## 2020-12-04 DIAGNOSIS — E785 Hyperlipidemia, unspecified: Secondary | ICD-10-CM

## 2020-12-04 LAB — CBC WITH DIFFERENTIAL/PLATELET
Basophils Absolute: 0 10*3/uL (ref 0.0–0.1)
Basophils Relative: 0.5 % (ref 0.0–3.0)
Eosinophils Absolute: 0.2 10*3/uL (ref 0.0–0.7)
Eosinophils Relative: 4.5 % (ref 0.0–5.0)
HCT: 35.3 % — ABNORMAL LOW (ref 36.0–46.0)
Hemoglobin: 11.8 g/dL — ABNORMAL LOW (ref 12.0–15.0)
Lymphocytes Relative: 44.9 % (ref 12.0–46.0)
Lymphs Abs: 1.9 10*3/uL (ref 0.7–4.0)
MCHC: 33.3 g/dL (ref 30.0–36.0)
MCV: 85.1 fl (ref 78.0–100.0)
Monocytes Absolute: 0.3 10*3/uL (ref 0.1–1.0)
Monocytes Relative: 6.5 % (ref 3.0–12.0)
Neutro Abs: 1.8 10*3/uL (ref 1.4–7.7)
Neutrophils Relative %: 43.6 % (ref 43.0–77.0)
Platelets: 234 10*3/uL (ref 150.0–400.0)
RBC: 4.15 Mil/uL (ref 3.87–5.11)
RDW: 13.6 % (ref 11.5–15.5)
WBC: 4.2 10*3/uL (ref 4.0–10.5)

## 2020-12-04 LAB — COMPREHENSIVE METABOLIC PANEL
ALT: 32 U/L (ref 0–35)
AST: 34 U/L (ref 0–37)
Albumin: 3.9 g/dL (ref 3.5–5.2)
Alkaline Phosphatase: 86 U/L (ref 39–117)
BUN: 18 mg/dL (ref 6–23)
CO2: 30 mEq/L (ref 19–32)
Calcium: 8.9 mg/dL (ref 8.4–10.5)
Chloride: 105 mEq/L (ref 96–112)
Creatinine, Ser: 0.59 mg/dL (ref 0.40–1.20)
GFR: 98.75 mL/min (ref >60.00)
Glucose, Bld: 94 mg/dL (ref 70–99)
Potassium: 4.3 mEq/L (ref 3.5–5.1)
Sodium: 140 mEq/L (ref 135–145)
Total Bilirubin: 0.7 mg/dL (ref 0.2–1.2)
Total Protein: 7.3 g/dL (ref 6.0–8.3)

## 2020-12-04 LAB — LIPID PANEL
Cholesterol: 110 mg/dL (ref 0–200)
HDL: 53.8 mg/dL (ref >39.00)
LDL Cholesterol: 49 mg/dL (ref 0–99)
NonHDL: 56.39
Total CHOL/HDL Ratio: 2
Triglycerides: 39 mg/dL (ref 0.0–149.0)
VLDL: 7.8 mg/dL (ref 0.0–40.0)

## 2020-12-04 LAB — MICROALBUMIN / CREATININE URINE RATIO
Creatinine,U: 146.2 mg/dL
Microalb Creat Ratio: 0.5 mg/g (ref 0.0–30.0)
Microalb, Ur: 0.8 mg/dL (ref 0.0–1.9)

## 2020-12-04 LAB — TSH: TSH: 3.37 u[IU]/mL (ref 0.35–4.50)

## 2020-12-04 MED ORDER — AMLODIPINE BESYLATE 5 MG PO TABS: 5.0000 mg | ORAL_TABLET | Freq: Every day | ORAL | 3 refills | Status: DC

## 2020-12-04 MED ORDER — ATORVASTATIN CALCIUM 40 MG PO TABS: 40.0000 mg | ORAL_TABLET | Freq: Every day | ORAL | 3 refills | Status: DC

## 2020-12-04 MED ORDER — LOSARTAN POTASSIUM 50 MG PO TABS: 50.0000 mg | ORAL_TABLET | Freq: Every day | ORAL | 3 refills | Status: DC

## 2020-12-04 NOTE — Progress Notes (Signed)
-                                                                                                                                                                                Subjective:     Judy Gutierrez is a 60 y.o. female and is here for a comprehensive physical exam. The patient reports no problems R wrist pain with exercise with yoga  --- no known injury except doing yoga -- she uses wrist support and it helps  She is also f/u on bp and cholesterol    No other complaints   Social History   Socioeconomic History  . Marital status: Married    Spouse name: Not on file  . Number of children: 1  . Years of education: Not on file  . Highest education level: Not on file  Occupational History  . Occupation: Science writer    Comment: self employed  Tobacco Use  . Smoking status: Never Smoker  . Smokeless tobacco: Never Used  Substance and Sexual Activity  . Alcohol use: No  . Drug use: No  . Sexual activity: Yes  Other Topics Concern  . Not on file  Social History Narrative   Lives with husband   Right Handed   Drinks caffeine seldom   Social Determinants of Health   Financial Resource Strain: Not on file  Food Insecurity: Not on file  Transportation Needs: No Transportation Needs  . Lack of Transportation (Medical): No  . Lack of Transportation (Non-Medical): No  Physical Activity: Not on file  Stress: Not on file  Social Connections: Not on file  Intimate Partner Violence: Not on file   Health Maintenance  Topic Date Due  . Fecal DNA (Cologuard)  10/04/2018  . PAP SMEAR-Modifier  06/18/2019  . INFLUENZA VACCINE  05/17/2020  . MAMMOGRAM  08/03/2022  . TETANUS/TDAP  06/28/2028  . COVID-19 Vaccine  Completed  . Hepatitis C Screening  Completed  . HIV Screening  Completed    The following  portions of the patient's history were reviewed and updated as appropriate:  She  has a past medical history of Disturbance of skin sensation, Myalgia and myositis, unspecified, Other B-complex deficiencies, Other disorders of bone and cartilage(733.99), Stroke (HCC), and Unspecified essential hypertension. She does not have any pertinent problems on file. She  has a past surgical history that includes Cesarean section (1992); LOOP RECORDER INSERTION (N/A, 03/05/2020); TEE without cardioversion (N/A, 03/05/2020); and Bubble study (03/05/2020). Her family history includes Diabetes in her father; Diabetes Mellitus II in her sister; Hepatitis in her father; Pulmonary embolism in her mother. She  reports that she has never smoked. She has never used smokeless tobacco. She  reports that she does not drink alcohol and does not use drugs. She has a current medication list which includes the following prescription(s): aspirin, calcium-magnesium-vitamin d, multisource calcium mag/d, multivitamin with minerals, amlodipine, atorvastatin, and losartan. Current Outpatient Medications on File Prior to Visit  Medication Sig Dispense Refill  . aspirin 81 MG EC tablet Take 1 tablet (81 mg total) by mouth daily. 90 tablet 1  . Calcium-Magnesium-Vitamin D (CALCIUM MAGNESIUM PO) Take by mouth daily.    . Multiple Minerals-Vitamins (MULTISOURCE CALCIUM MAG/D) TABS Take 1 tablet by mouth daily.    . Multiple Vitamin (MULTIVITAMIN WITH MINERALS) TABS tablet Take 1 tablet by mouth daily.     No current facility-administered medications on file prior to visit.   She has No Known Allergies..  Review of Systems Review of Systems  Constitutional: Negative for activity change, appetite change and fatigue.  HENT: Negative for hearing loss, congestion, tinnitus and ear discharge.  dentist q19m Eyes: Negative for visual disturbance (see optho q1y -- vision corrected to 20/20 with glasses).  Respiratory: Negative for cough, chest  tightness and shortness of breath.   Cardiovascular: Negative for chest pain, palpitations and leg swelling.  Gastrointestinal: Negative for abdominal pain, diarrhea, constipation and abdominal distention.  Genitourinary: Negative for urgency, frequency, decreased urine volume and difficulty urinating.  Musculoskeletal: Negative for back pain, and gait problem.  + wrist pain Skin: Negative for color change, pallor and rash.  Neurological: Negative for dizziness, light-headedness, numbness and headaches.  Hematological: Negative for adenopathy. Does not bruise/bleed easily.  Psychiatric/Behavioral: Negative for suicidal ideas, confusion, sleep disturbance, self-injury, dysphoric mood, decreased concentration and agitation.      Objective:    BP 132/80 (BP Location: Left Arm, Patient Position: Sitting, Cuff Size: Normal)   Pulse 76   Temp 99.1 F (37.3 C) (Oral)   Resp 18   Ht 5\' 2"  (1.575 m)   LMP 12/08/2010   SpO2 98%   BMI 23.67 kg/m  General appearance: alert, cooperative, appears stated age and no distress Head: Normocephalic, without obvious abnormality, atraumatic Eyes: negative findings: lids and lashes normal and pupils equal, round, reactive to light and accomodation Ears: normal TM's and external ear canals both ears Neck: no adenopathy, no carotid bruit, no JVD, supple, symmetrical, trachea midline and thyroid not enlarged, symmetric, no tenderness/mass/nodules Back: symmetric, no curvature. ROM normal. No CVA tenderness. Lungs: clear to auscultation bilaterally Breasts: gyn Heart: regular rate and rhythm, S1, S2 normal, no murmur, click, rub or gallop Abdomen: soft, non-tender; bowel sounds normal; no masses,  no organomegaly Pelvic: deferred--gyn Extremities: extremities normal, atraumatic, no cyanosis or edema Pulses: 2+ and symmetric Skin: Skin color, texture, turgor normal. No rashes or lesions Lymph nodes: Cervical, supraclavicular, and axillary nodes  normal. Neurologic: Alert and oriented X 3, normal strength and tone. Normal symmetric reflexes. Normal coordination and gait    Assessment:    Healthy female exam.      Plan:    ghm utd Check labs See After Visit Summary for Counseling Recommendations    1. Primary hypertension Well controlled, no changes to meds. Encouraged heart healthy diet such as the DASH diet and exercise as tolerated.  - CBC with Differential/Platelet - Lipid panel - TSH - Comprehensive metabolic panel - Microalbumin / creatinine urine ratio - amLODipine (NORVASC) 5 MG tablet; Take 1 tablet (5 mg total) by mouth daily.  Dispense: 90 tablet; Refill: 3 - atorvastatin (LIPITOR) 40 MG tablet; Take 1 tablet (40 mg total) by mouth daily.  Dispense: 90 tablet; Refill: 3 - losartan (COZAAR) 50 MG tablet; Take 1 tablet (50 mg total) by mouth daily.  Dispense: 90 tablet; Refill: 3  2. Hyperlipidemia, unspecified hyperlipidemia type Tolerating statin, encouraged heart healthy diet, avoid trans fats, minimize simple carbs and saturated fats. Increase exercise as tolerated - Lipid panel - Comprehensive metabolic panel - atorvastatin (LIPITOR) 40 MG tablet; Take 1 tablet (40 mg total) by mouth daily.  Dispense: 90 tablet; Refill: 3  3. Preventative health care See above  - CBC with Differential/Platelet - Lipid panel - TSH - Comprehensive metabolic panel  4. Essential hypertension Well controlled, no changes to meds. Encouraged heart healthy diet such as the DASH diet and exercise as tolerated.   - amLODipine (NORVASC) 5 MG tablet; Take 1 tablet (5 mg total) by mouth daily.  Dispense: 90 tablet; Refill: 3 - atorvastatin (LIPITOR) 40 MG tablet; Take 1 tablet (40 mg total) by mouth daily.  Dispense: 90 tablet; Refill: 3 - losartan (COZAAR) 50 MG tablet; Take 1 tablet (50 mg total) by mouth daily.  Dispense: 90 tablet; Refill: 3

## 2020-12-04 NOTE — Patient Instructions (Signed)
Preventive Care 84-60 Years Old, Female Preventive care refers to lifestyle choices and visits with your health care provider that can promote health and wellness. This includes:  A yearly physical exam. This is also called an annual wellness visit.  Regular dental and eye exams.  Immunizations.  Screening for certain conditions.  Healthy lifestyle choices, such as: ? Eating a healthy diet. ? Getting regular exercise. ? Not using drugs or products that contain nicotine and tobacco. ? Limiting alcohol use. What can I expect for my preventive care visit? Physical exam Your health care provider will check your:  Height and weight. These may be used to calculate your BMI (body mass index). BMI is a measurement that tells if you are at a healthy weight.  Heart rate and blood pressure.  Body temperature.  Skin for abnormal spots. Counseling Your health care provider may ask you questions about your:  Past medical problems.  Family's medical history.  Alcohol, tobacco, and drug use.  Emotional well-being.  Home life and relationship well-being.  Sexual activity.  Diet, exercise, and sleep habits.  Work and work Statistician.  Access to firearms.  Method of birth control.  Menstrual cycle.  Pregnancy history. What immunizations do I need? Vaccines are usually given at various ages, according to a schedule. Your health care provider will recommend vaccines for you based on your age, medical history, and lifestyle or other factors, such as travel or where you work.   What tests do I need? Blood tests  Lipid and cholesterol levels. These may be checked every 5 years, or more often if you are over 3 years old.  Hepatitis C test.  Hepatitis B test. Screening  Lung cancer screening. You may have this screening every year starting at age 60 if you have a 30-pack-year history of smoking and currently smoke or have quit within the past 15 years.  Colorectal cancer  screening. ? All adults should have this screening starting at age 60 and continuing until age 17. ? Your health care provider may recommend screening at age 60 if you are at increased risk. ? You will have tests every 1-10 years, depending on your results and the type of screening test.  Diabetes screening. ? This is done by checking your blood sugar (glucose) after you have not eaten for a while (fasting). ? You may have this done every 1-3 years.  Mammogram. ? This may be done every 1-2 years. ? Talk with your health care provider about when you should start having regular mammograms. This may depend on whether you have a family history of breast cancer.  BRCA-related cancer screening. This may be done if you have a family history of breast, ovarian, tubal, or peritoneal cancers.  Pelvic exam and Pap test. ? This may be done every 3 years starting at age 60. ? Starting at age 11, this may be done every 5 years if you have a Pap test in combination with an HPV test. Other tests  STD (sexually transmitted disease) testing, if you are at risk.  Bone density scan. This is done to screen for osteoporosis. You may have this scan if you are at high risk for osteoporosis. Talk with your health care provider about your test results, treatment options, and if necessary, the need for more tests. Follow these instructions at home: Eating and drinking  Eat a diet that includes fresh fruits and vegetables, whole grains, lean protein, and low-fat dairy products.  Take vitamin and mineral supplements  as recommended by your health care provider.  Do not drink alcohol if: ? Your health care provider tells you not to drink. ? You are pregnant, may be pregnant, or are planning to become pregnant.  If you drink alcohol: ? Limit how much you have to 0-1 drink a day. ? Be aware of how much alcohol is in your drink. In the [place] one drink equals one 12 oz bottle of beer (355 mL), one 5 oz glass of  wine (148 mL), or one 1 oz glass of hard liquor (44 mL).   Lifestyle  Take daily care of your teeth and gums. Brush your teeth every morning and night with fluoride toothpaste. Floss one time each day.  Stay active. Exercise for at least 30 minutes 5 or more days each week.  Do not use any products that contain nicotine or tobacco, such as cigarettes, e-cigarettes, and chewing tobacco. If you need help quitting, ask your health care provider.  Do not use drugs.  If you are sexually active, practice safe sex. Use a condom or other form of protection to prevent STIs (sexually transmitted infections).  If you do not wish to become pregnant, use a form of birth control. If you plan to become pregnant, see your health care provider for a prepregnancy visit.  If told by your health care provider, take low-dose aspirin daily starting at age 60.  Find healthy ways to cope with stress, such as: ? Meditation, yoga, or listening to music. ? Journaling. ? Talking to a trusted person. ? Spending time with friends and family. Safety  Always wear your seat belt while driving or riding in a vehicle.  Do not drive: ? If you have been drinking alcohol. Do not ride with someone who has been drinking. ? When you are tired or distracted. ? While texting.  Wear a helmet and other protective equipment during sports activities.  If you have firearms in your house, make sure you follow all gun safety procedures. What's next?  Visit your health care provider once a year for an annual wellness visit.  Ask your health care provider how often you should have your eyes and teeth checked.  Stay up to date on all vaccines. This information is not intended to replace advice given to you by your health care provider. Make sure you discuss any questions you have with your health care provider. Document Revised: 07/07/2020 Document Reviewed: 06/14/2018 Elsevier Patient Education  2021 Elsevier Inc.  

## 2020-12-07 ENCOUNTER — Encounter: Payer: No Typology Code available for payment source | Admitting: Family Medicine

## 2020-12-07 ENCOUNTER — Ambulatory Visit (INDEPENDENT_AMBULATORY_CARE_PROVIDER_SITE_OTHER): Payer: No Typology Code available for payment source

## 2020-12-07 DIAGNOSIS — I639 Cerebral infarction, unspecified: Secondary | ICD-10-CM | POA: Diagnosis not present

## 2020-12-08 LAB — CUP PACEART REMOTE DEVICE CHECK
Date Time Interrogation Session: 20220217230707
Implantable Pulse Generator Implant Date: 20210520

## 2020-12-11 NOTE — Progress Notes (Signed)
Carelink Summary Report / Loop Recorder 

## 2021-01-10 LAB — CUP PACEART REMOTE DEVICE CHECK
Date Time Interrogation Session: 20220322230308
Implantable Pulse Generator Implant Date: 20210520

## 2021-01-11 ENCOUNTER — Ambulatory Visit (INDEPENDENT_AMBULATORY_CARE_PROVIDER_SITE_OTHER): Payer: No Typology Code available for payment source

## 2021-01-11 DIAGNOSIS — I639 Cerebral infarction, unspecified: Secondary | ICD-10-CM

## 2021-01-25 NOTE — Progress Notes (Signed)
Carelink Summary Report / Loop Recorder 

## 2021-02-03 ENCOUNTER — Telehealth: Payer: Self-pay | Admitting: Neurology

## 2021-02-03 ENCOUNTER — Telehealth: Payer: Self-pay | Admitting: Family Medicine

## 2021-02-03 NOTE — Telephone Encounter (Signed)
Patient stated that she was denied disability and her attorney suggested she ask you for a letter to appeal the decision.  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   She said she needs a letter describing why she can't work.   Patient is aware that Dr. [name] will not return to office til May 2nd.   Patient denied further questions, verbalized understanding and expressed appreciation for the phone call.

## 2021-02-03 NOTE — Telephone Encounter (Signed)
Okay for letter

## 2021-02-03 NOTE — Telephone Encounter (Signed)
Pt called stating that she is needing a disability letter written for her. Please advise.

## 2021-02-03 NOTE — Telephone Encounter (Signed)
Caller Judy Gutierrez  Call Back @ [phone]  Patient is requesting a letter from [facility] in reference to her disability denial. Patient would like to letter to state the reasons she can not work (I.e her stroke and not being able to with stand weight on legs , also the stress of work.   Please advise when letter is complete and mail to patient. Patient will need letter by next week.

## 2021-02-04 NOTE — Telephone Encounter (Signed)
yes

## 2021-02-05 NOTE — Telephone Encounter (Signed)
Letter mailed, pt is aware.

## 2021-02-08 ENCOUNTER — Ambulatory Visit (INDEPENDENT_AMBULATORY_CARE_PROVIDER_SITE_OTHER): Payer: No Typology Code available for payment source

## 2021-02-08 DIAGNOSIS — I639 Cerebral infarction, unspecified: Secondary | ICD-10-CM

## 2021-02-09 LAB — CUP PACEART REMOTE DEVICE CHECK
Date Time Interrogation Session: 20220424230446
Implantable Pulse Generator Implant Date: 20210520

## 2021-02-17 NOTE — Telephone Encounter (Signed)
Kindly inform the patient that as per my last office visit in November 2021 she was doing well and had recovered quite well from a stroke and I am unable to write a letter for her supporting disability

## 2021-02-17 NOTE — Telephone Encounter (Signed)
Pt is asking for a call re: the status of her request for a letter re: her applying for disability.  Please call

## 2021-02-18 NOTE — Telephone Encounter (Signed)
Patient also sent message by MyChart

## 2021-02-22 ENCOUNTER — Inpatient Hospital Stay (HOSPITAL_COMMUNITY)
Admission: EM | Admit: 2021-02-22 | Discharge: 2021-02-26 | DRG: 065 | Disposition: A | Discharge status: Rehabilitation Facility | Payer: No Typology Code available for payment source | Attending: Internal Medicine | Admitting: Internal Medicine

## 2021-02-22 ENCOUNTER — Other Ambulatory Visit: Payer: Self-pay

## 2021-02-22 ENCOUNTER — Encounter (HOSPITAL_COMMUNITY): Payer: Self-pay

## 2021-02-22 DIAGNOSIS — I639 Cerebral infarction, unspecified: Secondary | ICD-10-CM | POA: Diagnosis not present

## 2021-02-22 DIAGNOSIS — I63541 Cerebral infarction due to unspecified occlusion or stenosis of right cerebellar artery: Secondary | ICD-10-CM | POA: Diagnosis present

## 2021-02-22 DIAGNOSIS — G8191 Hemiplegia, unspecified affecting right dominant side: Secondary | ICD-10-CM | POA: Diagnosis present

## 2021-02-22 DIAGNOSIS — Z833 Family history of diabetes mellitus: Secondary | ICD-10-CM

## 2021-02-22 DIAGNOSIS — I10 Essential (primary) hypertension: Secondary | ICD-10-CM

## 2021-02-22 DIAGNOSIS — Z79899 Other long term (current) drug therapy: Secondary | ICD-10-CM

## 2021-02-22 DIAGNOSIS — Z9282 Status post administration of tPA (rtPA) in a different facility within the last 24 hours prior to admission to current facility: Secondary | ICD-10-CM

## 2021-02-22 DIAGNOSIS — Z8673 Personal history of transient ischemic attack (TIA), and cerebral infarction without residual deficits: Secondary | ICD-10-CM

## 2021-02-22 DIAGNOSIS — Z7982 Long term (current) use of aspirin: Secondary | ICD-10-CM

## 2021-02-22 DIAGNOSIS — E871 Hypo-osmolality and hyponatremia: Secondary | ICD-10-CM

## 2021-02-22 DIAGNOSIS — R7303 Prediabetes: Secondary | ICD-10-CM | POA: Diagnosis present

## 2021-02-22 DIAGNOSIS — D6862 Lupus anticoagulant syndrome: Secondary | ICD-10-CM | POA: Diagnosis present

## 2021-02-22 DIAGNOSIS — R4781 Slurred speech: Secondary | ICD-10-CM | POA: Diagnosis present

## 2021-02-22 DIAGNOSIS — Z20822 Contact with and (suspected) exposure to covid-19: Secondary | ICD-10-CM | POA: Diagnosis present

## 2021-02-22 DIAGNOSIS — I63512 Cerebral infarction due to unspecified occlusion or stenosis of left middle cerebral artery: Principal | ICD-10-CM | POA: Diagnosis present

## 2021-02-22 DIAGNOSIS — E785 Hyperlipidemia, unspecified: Secondary | ICD-10-CM | POA: Diagnosis present

## 2021-02-22 DIAGNOSIS — R3129 Other microscopic hematuria: Secondary | ICD-10-CM | POA: Diagnosis not present

## 2021-02-22 DIAGNOSIS — E876 Hypokalemia: Secondary | ICD-10-CM | POA: Diagnosis not present

## 2021-02-22 DIAGNOSIS — D62 Acute posthemorrhagic anemia: Secondary | ICD-10-CM

## 2021-02-22 LAB — CBC
HCT: 37.1 % (ref 36.0–46.0)
Hemoglobin: 12.3 g/dL (ref 12.0–15.0)
MCH: 29 pg (ref 26.0–34.0)
MCHC: 33.2 g/dL (ref 30.0–36.0)
MCV: 87.5 fL (ref 80.0–100.0)
Platelets: 263 10*3/uL (ref 150–400)
RBC: 4.24 MIL/uL (ref 3.87–5.11)
RDW: 12.4 % (ref 11.5–15.5)
WBC: 11.2 10*3/uL — ABNORMAL HIGH (ref 4.0–10.5)
nRBC: 0 % (ref 0.0–0.2)

## 2021-02-22 LAB — URINALYSIS, ROUTINE W REFLEX MICROSCOPIC
Bacteria, UA: NONE SEEN
Bilirubin Urine: NEGATIVE
Glucose, UA: NEGATIVE mg/dL
Hgb urine dipstick: NEGATIVE
Ketones, ur: NEGATIVE mg/dL
Nitrite: NEGATIVE
Protein, ur: NEGATIVE mg/dL
Specific Gravity, Urine: 1.01 (ref 1.005–1.030)
pH: 8 (ref 5.0–8.0)

## 2021-02-22 LAB — PREGNANCY, URINE: Preg Test, Ur: NEGATIVE

## 2021-02-22 NOTE — ED Triage Notes (Signed)
Brought in by [facility] from home - vertigo, nausea, vomiting started 2150. Pt passed stroke scale.    4mg  zofran. 18 left ac. But still vomited. BGL 122mg /dl.  . 

## 2021-02-23 ENCOUNTER — Emergency Department (HOSPITAL_COMMUNITY): Payer: No Typology Code available for payment source

## 2021-02-23 ENCOUNTER — Inpatient Hospital Stay (HOSPITAL_COMMUNITY): Payer: No Typology Code available for payment source

## 2021-02-23 DIAGNOSIS — Z9282 Status post administration of tPA (rtPA) in a different facility within the last 24 hours prior to admission to current facility: Secondary | ICD-10-CM | POA: Diagnosis not present

## 2021-02-23 DIAGNOSIS — R000 Tachycardia, unspecified: Secondary | ICD-10-CM | POA: Diagnosis not present

## 2021-02-23 DIAGNOSIS — I615 Nontraumatic intracerebral hemorrhage, intraventricular: Secondary | ICD-10-CM | POA: Diagnosis not present

## 2021-02-23 DIAGNOSIS — G936 Cerebral edema: Secondary | ICD-10-CM | POA: Diagnosis not present

## 2021-02-23 DIAGNOSIS — D62 Acute posthemorrhagic anemia: Secondary | ICD-10-CM | POA: Diagnosis not present

## 2021-02-23 DIAGNOSIS — R3129 Other microscopic hematuria: Secondary | ICD-10-CM | POA: Diagnosis not present

## 2021-02-23 DIAGNOSIS — Z7982 Long term (current) use of aspirin: Secondary | ICD-10-CM | POA: Diagnosis not present

## 2021-02-23 DIAGNOSIS — I639 Cerebral infarction, unspecified: Secondary | ICD-10-CM | POA: Diagnosis present

## 2021-02-23 DIAGNOSIS — H539 Unspecified visual disturbance: Secondary | ICD-10-CM | POA: Diagnosis not present

## 2021-02-23 DIAGNOSIS — R7303 Prediabetes: Secondary | ICD-10-CM | POA: Diagnosis present

## 2021-02-23 DIAGNOSIS — E871 Hypo-osmolality and hyponatremia: Secondary | ICD-10-CM | POA: Diagnosis not present

## 2021-02-23 DIAGNOSIS — I63541 Cerebral infarction due to unspecified occlusion or stenosis of right cerebellar artery: Secondary | ICD-10-CM | POA: Diagnosis present

## 2021-02-23 DIAGNOSIS — R4781 Slurred speech: Secondary | ICD-10-CM | POA: Diagnosis present

## 2021-02-23 DIAGNOSIS — G911 Obstructive hydrocephalus: Secondary | ICD-10-CM | POA: Diagnosis not present

## 2021-02-23 DIAGNOSIS — Z79899 Other long term (current) drug therapy: Secondary | ICD-10-CM | POA: Diagnosis not present

## 2021-02-23 DIAGNOSIS — R7401 Elevation of levels of liver transaminase levels: Secondary | ICD-10-CM | POA: Diagnosis not present

## 2021-02-23 DIAGNOSIS — I63512 Cerebral infarction due to unspecified occlusion or stenosis of left middle cerebral artery: Secondary | ICD-10-CM | POA: Diagnosis present

## 2021-02-23 DIAGNOSIS — E876 Hypokalemia: Secondary | ICD-10-CM | POA: Diagnosis not present

## 2021-02-23 DIAGNOSIS — D6862 Lupus anticoagulant syndrome: Secondary | ICD-10-CM | POA: Diagnosis present

## 2021-02-23 DIAGNOSIS — I69398 Other sequelae of cerebral infarction: Secondary | ICD-10-CM | POA: Diagnosis not present

## 2021-02-23 DIAGNOSIS — Z833 Family history of diabetes mellitus: Secondary | ICD-10-CM | POA: Diagnosis not present

## 2021-02-23 DIAGNOSIS — Z8673 Personal history of transient ischemic attack (TIA), and cerebral infarction without residual deficits: Secondary | ICD-10-CM | POA: Diagnosis not present

## 2021-02-23 DIAGNOSIS — I6389 Other cerebral infarction: Secondary | ICD-10-CM | POA: Diagnosis not present

## 2021-02-23 DIAGNOSIS — Z20822 Contact with and (suspected) exposure to covid-19: Secondary | ICD-10-CM | POA: Diagnosis present

## 2021-02-23 DIAGNOSIS — I63513 Cerebral infarction due to unspecified occlusion or stenosis of bilateral middle cerebral arteries: Secondary | ICD-10-CM | POA: Diagnosis not present

## 2021-02-23 DIAGNOSIS — I63441 Cerebral infarction due to embolism of right cerebellar artery: Secondary | ICD-10-CM | POA: Diagnosis not present

## 2021-02-23 DIAGNOSIS — G8191 Hemiplegia, unspecified affecting right dominant side: Secondary | ICD-10-CM | POA: Diagnosis present

## 2021-02-23 DIAGNOSIS — I10 Essential (primary) hypertension: Secondary | ICD-10-CM | POA: Diagnosis present

## 2021-02-23 DIAGNOSIS — R270 Ataxia, unspecified: Secondary | ICD-10-CM | POA: Diagnosis not present

## 2021-02-23 DIAGNOSIS — R2689 Other abnormalities of gait and mobility: Secondary | ICD-10-CM | POA: Diagnosis not present

## 2021-02-23 DIAGNOSIS — I7774 Dissection of vertebral artery: Secondary | ICD-10-CM | POA: Diagnosis not present

## 2021-02-23 DIAGNOSIS — E785 Hyperlipidemia, unspecified: Secondary | ICD-10-CM | POA: Diagnosis present

## 2021-02-23 LAB — BASIC METABOLIC PANEL
Anion gap: 6 (ref 5–15)
Anion gap: 7 (ref 5–15)
BUN: 13 mg/dL (ref 6–20)
BUN: 9 mg/dL (ref 6–20)
CO2: 24 mmol/L (ref 22–32)
CO2: 22 mmol/L (ref 22–32)
Calcium: 8.7 mg/dL — ABNORMAL LOW (ref 8.9–10.3)
Calcium: 8.4 mg/dL — ABNORMAL LOW (ref 8.9–10.3)
Chloride: 106 mmol/L (ref 98–111)
Chloride: 105 mmol/L (ref 98–111)
Creatinine, Ser: 0.53 mg/dL (ref 0.44–1.00)
Creatinine, Ser: 0.54 mg/dL (ref 0.44–1.00)
GFR, Estimated: >60 mL/min (ref >60)
GFR, Estimated: >60 mL/min (ref >60)
Glucose, Bld: 120 mg/dL — ABNORMAL HIGH (ref 70–99)
Glucose, Bld: 123 mg/dL — ABNORMAL HIGH (ref 70–99)
Potassium: 2.8 mmol/L — ABNORMAL LOW (ref 3.5–5.1)
Potassium: 3.8 mmol/L (ref 3.5–5.1)
Sodium: 136 mmol/L (ref 135–145)
Sodium: 134 mmol/L — ABNORMAL LOW (ref 135–145)

## 2021-02-23 LAB — HEPATIC FUNCTION PANEL
ALT: 37 U/L (ref 0–44)
AST: 43 U/L — ABNORMAL HIGH (ref 15–41)
Albumin: 3.8 g/dL (ref 3.5–5.0)
Alkaline Phosphatase: 86 U/L (ref 38–126)
Bilirubin, Direct: <0.1 mg/dL (ref 0.0–0.2)
Total Bilirubin: 0.6 mg/dL (ref 0.3–1.2)
Total Protein: 7.8 g/dL (ref 6.5–8.1)

## 2021-02-23 LAB — I-STAT CHEM 8, ED
BUN: 14 mg/dL (ref 6–20)
Calcium, Ion: 1.08 mmol/L — ABNORMAL LOW (ref 1.15–1.40)
Chloride: 104 mmol/L (ref 98–111)
Creatinine, Ser: 0.4 mg/dL — ABNORMAL LOW (ref 0.44–1.00)
Glucose, Bld: 139 mg/dL — ABNORMAL HIGH (ref 70–99)
HCT: 37 % (ref 36.0–46.0)
Hemoglobin: 12.6 g/dL (ref 12.0–15.0)
Potassium: 3.9 mmol/L (ref 3.5–5.1)
Sodium: 139 mmol/L (ref 135–145)
TCO2: 26 mmol/L (ref 22–32)

## 2021-02-23 LAB — ANTITHROMBIN III: AntiThromb III Func: 105 % (ref 75–120)

## 2021-02-23 LAB — ECHOCARDIOGRAM COMPLETE
AR max vel: 2.2 cm2
AV Area VTI: 2.32 cm2
AV Area mean vel: 2.19 cm2
AV Mean grad: 3 mmHg
AV Peak grad: 5.9 mmHg
Ao pk vel: 1.22 m/s
Area-P 1/2: 3.17 cm2
Height: 62 in
S' Lateral: 2.5 cm
Weight: 2190.49 oz

## 2021-02-23 LAB — ETHANOL: Alcohol, Ethyl (B): <10 mg/dL (ref <10)

## 2021-02-23 LAB — CBC
HCT: 38.5 % (ref 36.0–46.0)
Hemoglobin: 12.6 g/dL (ref 12.0–15.0)
MCH: 28.4 pg (ref 26.0–34.0)
MCHC: 32.7 g/dL (ref 30.0–36.0)
MCV: 86.9 fL (ref 80.0–100.0)
Platelets: 238 10*3/uL (ref 150–400)
RBC: 4.43 MIL/uL (ref 3.87–5.11)
RDW: 12.6 % (ref 11.5–15.5)
WBC: 6.9 10*3/uL (ref 4.0–10.5)
nRBC: 0 % (ref 0.0–0.2)

## 2021-02-23 LAB — PROTIME-INR
INR: 1.1 (ref 0.8–1.2)
Prothrombin Time: 13.8 seconds (ref 11.4–15.2)

## 2021-02-23 LAB — DIFFERENTIAL
Abs Immature Granulocytes: 0.06 10*3/uL (ref 0.00–0.07)
Basophils Absolute: 0.1 10*3/uL (ref 0.0–0.1)
Basophils Relative: 0 %
Eosinophils Absolute: 0.4 10*3/uL (ref 0.0–0.5)
Eosinophils Relative: 3 %
Immature Granulocytes: 1 %
Lymphocytes Relative: 51 %
Lymphs Abs: 5.7 10*3/uL — ABNORMAL HIGH (ref 0.7–4.0)
Monocytes Absolute: 0.8 10*3/uL (ref 0.1–1.0)
Monocytes Relative: 7 %
Neutro Abs: 4.2 10*3/uL (ref 1.7–7.7)
Neutrophils Relative %: 38 %

## 2021-02-23 LAB — APTT: aPTT: 23 seconds — ABNORMAL LOW (ref 24–36)

## 2021-02-23 LAB — RAPID URINE DRUG SCREEN, HOSP PERFORMED
Amphetamines: NOT DETECTED
Barbiturates: NOT DETECTED
Benzodiazepines: NOT DETECTED
Cocaine: NOT DETECTED
Opiates: NOT DETECTED
Tetrahydrocannabinol: NOT DETECTED

## 2021-02-23 LAB — LIPID PANEL
Cholesterol: 105 mg/dL (ref 0–200)
HDL: 48 mg/dL (ref >40)
LDL Cholesterol: 51 mg/dL (ref 0–99)
Total CHOL/HDL Ratio: 2.2 RATIO
Triglycerides: 28 mg/dL (ref <150)
VLDL: 6 mg/dL (ref 0–40)

## 2021-02-23 LAB — HEMOGLOBIN A1C
Hgb A1c MFr Bld: 6 % — ABNORMAL HIGH (ref 4.8–5.6)
Mean Plasma Glucose: 125.5 mg/dL

## 2021-02-23 LAB — PHOSPHORUS: Phosphorus: 3.6 mg/dL (ref 2.5–4.6)

## 2021-02-23 LAB — I-STAT BETA HCG BLOOD, ED (MC, WL, AP ONLY): I-stat hCG, quantitative: <5 m[IU]/mL (ref <5)

## 2021-02-23 LAB — MAGNESIUM: Magnesium: 1.9 mg/dL (ref 1.7–2.4)

## 2021-02-23 LAB — RESP PANEL BY RT-PCR (FLU A&B, COVID) ARPGX2
Influenza A by PCR: NEGATIVE
Influenza B by PCR: NEGATIVE
SARS Coronavirus 2 by RT PCR: NEGATIVE

## 2021-02-23 LAB — CBG MONITORING, ED: Glucose-Capillary: 126 mg/dL — ABNORMAL HIGH (ref 70–99)

## 2021-02-23 MED ORDER — ATORVASTATIN CALCIUM 80 MG PO TABS
80.0000 mg | ORAL_TABLET | Freq: Every day | ORAL | Status: DC
  Administered 2021-02-23 – 2021-02-26 (×4): 80 mg via ORAL
  Filled 2021-02-23 (×4): qty 1

## 2021-02-23 MED ORDER — ENOXAPARIN SODIUM 40 MG/0.4ML IJ SOSY
40.0000 mg | PREFILLED_SYRINGE | INTRAMUSCULAR | Status: DC
  Administered 2021-02-23 – 2021-02-26 (×4): 40 mg via SUBCUTANEOUS
  Filled 2021-02-23 (×4): qty 0.4

## 2021-02-23 MED ORDER — ONDANSETRON HCL 4 MG/2ML IJ SOLN
4.0000 mg | Freq: Once | INTRAMUSCULAR | Status: AC
Start: 2021-02-23 — End: 2021-02-23
  Administered 2021-02-23: 4 mg via INTRAVENOUS
  Filled 2021-02-23: qty 2

## 2021-02-23 MED ORDER — ACETAMINOPHEN 325 MG PO TABS
650.0000 mg | ORAL_TABLET | Freq: Four times a day (QID) | ORAL | Status: DC | PRN
  Administered 2021-02-23 – 2021-02-24 (×2): 650 mg via ORAL
  Filled 2021-02-23 (×2): qty 2

## 2021-02-23 MED ORDER — CLOPIDOGREL BISULFATE 300 MG PO TABS
300.0000 mg | ORAL_TABLET | Freq: Once | ORAL | Status: AC
  Administered 2021-02-23: 300 mg via ORAL
  Filled 2021-02-23: qty 1

## 2021-02-23 MED ORDER — ALTEPLASE (STROKE) FULL DOSE INFUSION
0.9000 mg/kg | Freq: Once | INTRAVENOUS | Status: DC
  Filled 2021-02-23: qty 100

## 2021-02-23 MED ORDER — CLOPIDOGREL BISULFATE 75 MG PO TABS: 75.0000 mg | ORAL_TABLET | Freq: Every day | ORAL | Status: DC

## 2021-02-23 MED ORDER — ASPIRIN EC 81 MG PO TBEC
81.0000 mg | DELAYED_RELEASE_TABLET | Freq: Every day | ORAL | Status: DC
  Administered 2021-02-24 – 2021-02-26 (×3): 81 mg via ORAL
  Filled 2021-02-23 (×3): qty 1

## 2021-02-23 MED ORDER — IOHEXOL 350 MG/ML SOLN
75.0000 mL | Freq: Once | INTRAVENOUS | Status: AC | PRN
  Administered 2021-02-23: 75 mL via INTRAVENOUS

## 2021-02-23 MED ORDER — SODIUM CHLORIDE 0.9 % IV BOLUS
1000.0000 mL | Freq: Once | INTRAVENOUS | Status: AC
  Administered 2021-02-23: 1000 mL via INTRAVENOUS

## 2021-02-23 MED ORDER — ASPIRIN 325 MG PO TABS
325.0000 mg | ORAL_TABLET | Freq: Every day | ORAL | Status: DC
  Administered 2021-02-23: 325 mg via ORAL
  Filled 2021-02-23 (×2): qty 1

## 2021-02-23 MED ORDER — TICAGRELOR 90 MG PO TABS
90.0000 mg | ORAL_TABLET | Freq: Two times a day (BID) | ORAL | Status: DC
  Administered 2021-02-24 – 2021-02-26 (×5): 90 mg via ORAL
  Filled 2021-02-23 (×5): qty 1

## 2021-02-23 MED ORDER — ONDANSETRON HCL 4 MG/2ML IJ SOLN
4.0000 mg | Freq: Four times a day (QID) | INTRAMUSCULAR | Status: DC | PRN
  Administered 2021-02-23: 4 mg via INTRAVENOUS
  Filled 2021-02-23 (×2): qty 2

## 2021-02-23 MED ORDER — HYDRALAZINE HCL 20 MG/ML IJ SOLN: 5.0000 mg | Freq: Four times a day (QID) | INTRAMUSCULAR | Status: DC | PRN

## 2021-02-23 MED ORDER — SODIUM CHLORIDE 0.9 % IV SOLN: INTRAVENOUS | Status: AC

## 2021-02-23 MED ORDER — SODIUM CHLORIDE 0.9 % IV SOLN: 50.0000 mL | Freq: Once | INTRAVENOUS | Status: DC

## 2021-02-23 NOTE — ED Provider Notes (Signed)
[facility] EMERGENCY DEPARTMENT Provider Note   CSN: [id number] Arrival date & time: 02/22/21  2245     History Chief Complaint  Patient presents with  . Dizziness  . Nausea  . Emesis    Judy Gutierrez is a 60 y.o. female.  Patient is a 60 year old female with past medical history of hypertension and prior embolic cerebellar stroke in 5573.  Patient presents today with complaints of acute onset of dizziness that she describes as a room spinning and imbalance with associated nausea and vomiting.  This began at 9:30 PM and patient presents for evaluation of this.  She denies any hearing loss or ringing in her ears.  Symptoms are worse when she attempts to stand and walk.  There are no alleviating factors.  Patient had prior cerebellar stroke with similar presentation in 2021.  She received tPA at [facility] prior to transfer to [facility].  The history is provided by the patient.  Dizziness Quality:  Head spinning, room spinning and imbalance Severity:  Moderate Onset quality:  Sudden Timing:  Constant Progression:  Unchanged Chronicity:  New Relieved by:  Nothing Worsened by:  Movement and standing up Ineffective treatments:  None tried Associated symptoms: vomiting   Emesis      Past Medical History:  Diagnosis Date  . Disturbance of skin sensation   . Myalgia and myositis, unspecified   . Other B-complex deficiencies   . 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 Stroke (HCC)   . Unspecified essential hypertension     Patient Active Problem List   Diagnosis Date Noted  . Dyslipidemia 06/01/2020  . 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 Essential hypertension 02/05/2020  . 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 Musculoskeletal chest pain 09/10/2014  . Hand pain 02/12/2014  . Fracture of left ulna 11/05/2013  . E. coli UTI 11/01/2013  . Anemia due to blood loss, acute 10/24/2013   . Pelvic fracture (HCC) 10/23/2013  . Hyperglycemia 09/18/2013  . Routine general medical examination at a health care facility 09/18/2013  . Elevated alkaline phosphatase level 09/18/2013  . Hot flashes, menopausal 04/10/2013  . Pain in joint, upper arm 04/10/2013    Past Surgical History:  Procedure Laterality Date  . BUBBLE STUDY  03/05/2020   Procedure: BUBBLE STUDY;  Surgeon: [name], MD;  Location: [facility] ENDOSCOPY;  Service: Cardiovascular;;  . CESAREAN SECTION  1992  . LOOP RECORDER INSERTION N/A 03/05/2020   Procedure: LOOP RECORDER INSERTION;  Surgeon: [name], MD;  Location: [facility] INVASIVE CV LAB;  Service: Cardiovascular;  Laterality: N/A;  . TEE WITHOUT CARDIOVERSION N/A 03/05/2020   Procedure: TRANSESOPHAGEAL ECHOCARDIOGRAM (TEE);  Surgeon: [name], MD;  Location: [facility] ENDOSCOPY;  Service: Cardiovascular;  Laterality: N/A;     OB History   No obstetric history on file.     Family History  Problem Relation Age of Onset  . Pulmonary embolism Mother   . Hepatitis Father   . Diabetes Father   . Diabetes Mellitus II Sister   . Breast cancer Neg Hx     Social History   Tobacco Use  . Smoking status: Never Smoker  . Smokeless tobacco: Never Used  Substance Use Topics  . Alcohol use: No  . Drug use: No    Home Medications Prior to Admission medications   Medication Sig Start Date End Date Taking? Authorizing Provider  amLODipine (NORVASC) 5 MG tablet  Take 1 tablet (5 mg total) by mouth daily. 12/04/20   [name], DO  aspirin 81 MG EC tablet Take 1 tablet (81 mg total) by mouth daily. 02/28/20   [name], DO  atorvastatin (LIPITOR) 40 MG tablet Take 1 tablet (40 mg total) by mouth daily. 12/04/20   [name], DO  Calcium-Magnesium-Vitamin D (CALCIUM MAGNESIUM PO) Take by mouth daily.    [provider]  losartan (COZAAR) 50 MG tablet Take 1 tablet (50 mg total) by mouth daily. 12/04/20    [name], DO  Multiple Minerals-Vitamins (MULTISOURCE CALCIUM MAG/D) TABS Take 1 tablet by mouth daily.    [provider]  Multiple Vitamin (MULTIVITAMIN WITH MINERALS) TABS tablet Take 1 tablet by mouth daily.    [provider]    Allergies    Patient has no known allergies.  Review of Systems   Review of Systems  Gastrointestinal: Positive for vomiting.  Neurological: Positive for dizziness.  All other systems reviewed and are negative.   Physical Exam Updated Vital Signs BP (!) 150/73   Pulse 66   Temp 98.6 F (37 C) (Oral)   Resp 18   Ht 5\' 2"  (1.575 m)   Wt 58.7 kg   LMP 12/08/2010   SpO2 98%   BMI 23.67 kg/m   Physical Exam Vitals and nursing note reviewed.  Constitutional:      General: She is not in acute distress.    Appearance: She is well-developed. She is not diaphoretic.  HENT:     Head: Normocephalic and atraumatic.  Eyes:     Extraocular Movements: Extraocular movements intact.     Pupils: Pupils are equal, round, and reactive to light.  Cardiovascular:     Rate and Rhythm: Normal rate and regular rhythm.     Heart sounds: No murmur heard. No friction rub. No gallop.   Pulmonary:     Effort: Pulmonary effort is normal. No respiratory distress.     Breath sounds: Normal breath sounds. No wheezing.  Abdominal:     General: Bowel sounds are normal. There is no distension.     Palpations: Abdomen is soft.     Tenderness: There is no abdominal tenderness.  Musculoskeletal:        General: Normal range of motion.     Cervical back: Normal range of motion and neck supple.  Skin:    General: Skin is warm and dry.  Neurological:     Mental Status: She is alert and oriented to person, place, and time.     Cranial Nerves: No cranial nerve deficit.     Motor: No weakness.     Comments: Patient is awake and alert.  She follows commands appropriately.  Strength is 5 out of 5 in all 4 extremities, but does seem to have some  delay with finger-to-nose.     ED Results / Procedures / Treatments   Labs (all labs ordered are listed, but only abnormal results are displayed) Labs Reviewed  BASIC METABOLIC PANEL - Abnormal; Notable for the following components:      Result Value   Potassium 2.8 (*)    Glucose, Bld 120 (*)    Calcium 8.7 (*)    All other components within normal limits  CBC - Abnormal; Notable for the following components:   WBC 11.2 (*)    All other components within normal limits  URINALYSIS, ROUTINE W REFLEX MICROSCOPIC - Abnormal; Notable for  the following components:   Leukocytes,Ua TRACE (*)    All other components within normal limits  RESP PANEL BY RT-PCR (FLU A&B, COVID) ARPGX2  PREGNANCY, URINE  ETHANOL  PROTIME-INR  APTT  DIFFERENTIAL  RAPID URINE DRUG SCREEN, HOSP PERFORMED  CBG MONITORING, ED  I-STAT CHEM 8, ED  I-STAT BETA HCG BLOOD, ED ([facility], [facility], [facility] ONLY)    EKG EKG Interpretation  Date/Time:  Tuesday Feb 23 2021 00:36:57 EDT Ventricular Rate:  83 PR Interval:  151 QRS Duration: 90 QT Interval:  401 QTC Calculation: 472 R Axis:   62 Text Interpretation: Sinus rhythm Biatrial enlargement Nonspecific T abnormalities, anterior leads Confirmed by Geoffery Lyons ([id number]) on 02/23/2021 1:44:30 AM   Radiology No results found.  Procedures Procedures   Medications Ordered in ED Medications  sodium chloride 0.9 % bolus 1,000 mL (1,000 mLs Intravenous New Bag/Given 02/23/21 0030)  ondansetron (ZOFRAN) injection 4 mg (4 mg Intravenous Given 02/23/21 0030)    ED Course  I have reviewed the triage vital signs and the nursing notes.  Pertinent labs & imaging results that were available during my care of the patient were reviewed by me and considered in my medical decision making (see chart for details).    MDM Rules/Calculators/A&P  Patient is a 60 year old female with history of prior CVA presenting with acute onset of dizziness, nausea, vomiting, and room spinning  sensation.  The symptoms were similar to what she experienced with her prior CVA.  After my initial evaluation, I immediately spoke with Dr. Amada Jupiter from neurology.  Due to the last known well time, a code stroke was initiated and patient sent for head CT.  This was performed and shows evolving hypodensity involving the superior right cerebellum/cerebellar vermis concerning for acute right MCA distribution infarct.  As the patient already had findings on CT scan, the decision was made to withhold tPA due to risk of bleeding.  Patient to be admitted to the hospitalist service for further work-up.  Have spoken with Dr. Margo Aye who agrees to admit.  CRITICAL CARE Performed by: Geoffery Lyons Total critical care time: 45 minutes Critical care time was exclusive of separately billable procedures and treating other patients. Critical care was necessary to treat or prevent imminent or life-threatening deterioration. Critical care was time spent personally by me on the following activities: development of treatment plan with patient and/or surrogate as well as nursing, discussions with consultants, evaluation of patient's response to treatment, examination of patient, obtaining history from patient or surrogate, ordering and performing treatments and interventions, ordering and review of laboratory studies, ordering and review of radiographic studies, pulse oximetry and re-evaluation of patient's condition.   Final Clinical Impression(s) / ED Diagnoses Final diagnoses:  None    Rx / DC Orders ED Discharge Orders    None       Geoffery Lyons, MD 02/23/21 [phone]

## 2021-02-23 NOTE — Progress Notes (Signed)
  Echocardiogram 2D Echocardiogram has been performed.  Judy Gutierrez  Judy Gutierrez 02/23/2021, 2:32 PM

## 2021-02-23 NOTE — ED Notes (Signed)
Assisted pt onto and off bedpan Pt denies getting nauseous or lightheaded with movement

## 2021-02-23 NOTE — Progress Notes (Signed)
STROKE TEAM PROGRESS NOTE   INTERVAL HISTORY No acute events  VSS.  Remains with reported symptoms of presentation including dizziness and nausea. Nausea worsened by movement. No ataxia on bed exam.  We discussed stroke plan of care, ongoing work up and diagnosis. Her questions were answered.   Vitals:   02/23/21 0230 02/23/21 0300 02/23/21 0600 02/23/21 0900  BP: 138/78 (!) 145/76 135/72 (!) 151/76  Pulse: 64 66 64 70  Resp: 16 18 19 18   Temp:    98.2 F (36.8 C)  TempSrc:    Oral  SpO2: 99% 98% 98% 100%  Weight:      Height:       CBC:  Recent Labs  Lab 02/22/21 2257 02/23/21 0045 02/23/21 0901  WBC 11.2*  --  6.9  NEUTROABS 4.2  --   --   HGB 12.3 12.6 12.6  HCT 37.1 37.0 38.5  MCV 87.5  --  86.9  PLT 263  --  238   Basic Metabolic Panel:  Recent Labs  Lab 02/22/21 2257 02/23/21 0045 02/23/21 0745  NA 136 139 134*  K 2.8* 3.9 3.8  CL 106 104 105  CO2 24  --  22  GLUCOSE 120* 139* 123*  BUN 13 14 9   CREATININE 0.53 0.40* 0.54  CALCIUM 8.7*  --  8.4*  MG  --   --  1.9  PHOS  --   --  3.6   Lipid Panel:  Recent Labs  Lab 02/23/21 0745  CHOL 105  TRIG 28  HDL 48  CHOLHDL 2.2  VLDL 6  LDLCALC 51   HgbA1c:  Recent Labs  Lab 02/23/21 0901  HGBA1C 6.0*   Urine Drug Screen:  Recent Labs  Lab 02/23/21 0033  LABOPIA NONE DETECTED  COCAINSCRNUR NONE DETECTED  LABBENZ NONE DETECTED  AMPHETMU NONE DETECTED  THCU NONE DETECTED  LABBARB NONE DETECTED    Alcohol Level  Recent Labs  Lab 02/23/21 0040  ETH <10   IMAGING AND PERTINENT DIAGNOSTICS MR Brain 4-5 cm region of acute infarction within the superior cerebellum on the right. Punctate acute infarction in the right dorsal midbrain. Mild swelling. No evidence of acute hemorrhage. Old infarction in the left superior cerebellum with atrophy, encephalomalacia and gliosis and hemosiderin deposition.  CT Head Code Stroke 1. Evolving hypodensity involving the superior  right cerebellum/cerebellar vermis, concerning for an acute right SCA distribution infarct. No intracranial hemorrhage or mass effect. 2. ASPECTS is 10. 3. Underlying chronic left SCA distribution infarcts. PHYSICAL EXAM Constitutional: Pleasant middle-age Caucasian 04/25/21 lady appears well-developed and well-nourished.  Psych: Affect appropriate to situation Eyes: No scleral injection HENT: No OP obstruction MSK: no joint deformities.  Cardiovascular: Normal rate and regular rhythm.  Respiratory: Effort normal, non-labored breathing GI: Soft.  No distension. There is no tenderness.  Skin: WDI  Neuro: Mental Status: Patient is awake, alert, oriented to person, place, month, year, and situation. Patient is able to give a clear and coherent history. No signs of aphasia or neglect Cranial Nerves: II: Visual Fields are full. Pupils are equal, round, and reactive to light.   III,IV, VI: EOMI without ptosis or diploplia.  Mild saccadic dysmetria on lateral gaze bilaterally but no nystagmus V: Facial sensation is symmetric to temperature VII: Facial movement is symmetric.  VIII: hearing is intact to voice X: Uvula elevates symmetrically XI: Shoulder shrug is symmetric. XII: tongue is midline without atrophy or fasciculations.  Motor: Tone is normal. Bulk is normal. 5/5 strength was  present in all four extremities.  Sensory: Sensation is symmetric to light touch and temperature in the arms and legs. Deep Tendon Reflexes: 2+ and symmetric in the biceps and patellae.  Plantars: Toes are downgoing bilaterally.  Cerebellar: FNF and HKS are intact on the left, impaired on the right arm >right leg   ASSESSMENT/PLAN Judy Gutierrez is a 60 y.o. female with a history of previous stroke affecting the left cerebellum who presents with similar symptoms. She states that she was in her normal state of health 5/9 until  developing sudden onset of vertigo, nausea and vomiting around 9:45 PM for  which she called EMS. No tPA was given. Admitted to the floor with  acute right cerebellar stroke with concern for dissection on CTA.   Cerebellar    Code Stroke: Evolving hypodensity involving the superior right cerebellum/cerebellar vermis, concerning for an acute right SCA distribution infarct.   CTA head and neck: Focal intimal irregularity involving the distal right V3 segment, Downstream occlusion of the mid-distal right superior cerebellar artery, likely embolic likely reflecting a short-segment dissection.  MRI: right superior cerebellum acute infarction  Loop recorder interrogation: No atrial fib or other arrhythmia which may contribute to stroke   2D Echo completed, result is pending  Hypercoag panel is pending   VTE prophylaxis -     Diet   Diet NPO time specified   On ASA 81mg  prior to admission.   Plan for DAPT with Brilinta 90 BID and ASA 81mg  x 4 weeks then Brilinta alone  Therapy recommendations:  Pending  Disposition:  TBD  Hypertension  Stable . Permissive hypertension (OK if < 220/120) but gradually normalize in 5-7 days . Long-term BP goal normotensive  Hyperlipidemia  Home meds:  Lipitor 40mg   LDL 51, at goal < 70  High intensity statin: already at goal on home regimen  Continue statin at discharge  Other Stroke Risk Factors  Hx stroke/TIA: Cerebellar 2019  Other Active Problems    Hospital day # 0  I have personally obtained history,examined this patient, reviewed notes, independently viewed imaging studies, participated in medical decision making and plan of care.ROS completed by me personally and pertinent positives fully documented  I have made any additions or clarifications directly to the above note. Agree with note above.  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  CT angiogram shows small filling defect in the  terminal right vertebral exquisitely partially recanalized embolus as a cause of the stroke.  Recommend aspirin and Brilinta for 4 weeks followed by Brilinta alone.  Recheck hypercoagulable panel labs.  Mobilize out of bed.  Therapy consults.  Continue aggressive risk factor modification.  Long discussion patient and answered questions.  Greater than 50% time during this 35-minute visit was spent in counseling and coordination of care about recurrent cryptogenic stroke and answering questions.  , MD Medical Director [facility] Pager: [phone] 02/23/2021 4:28 PM   To contact Stroke Continuity provider, please refer to [facility]. After hours, contact General Neurology

## 2021-02-23 NOTE — Progress Notes (Signed)
Pharmacist Code Stroke Response  Notified to mix tPA at 0049 by Dr. Amada Jupiter Delivered tPA to RN at [phone]  tPA dose = 5.6mg  bolus over 1 minute followed by 50.3mg  for a total dose of 55.9mg  over 1 hour  Abran Duke, PharmD, BCPS Clinical Pharmacist Phone: [phone]

## 2021-02-23 NOTE — ED Notes (Signed)
Returned from MRI 

## 2021-02-23 NOTE — Consult Note (Signed)
Neurology Consultation Reason for Consult: Vertigo Referring Physician: Stark Jock, D  CC: Vertigo  History is obtained from: Patient, husband  HPI: Judy Gutierrez is a 60 y.o. female with a history of previous stroke affecting the left cerebellum who presents with similar symptoms.  She states that she was in her normal state of health earlier tonight, walking around 9 PM and then sitting and watching TV for about 45 minutes before developing sudden onset of vertigo, nausea and vomiting around 9:45 PM.  She was brought in to the emergency department and triaged as vertigo.  Subsequently, on evaluation by the emergency department physician, it was recognized that she may have signs of stroke and therefore a code stroke was activated.  She was taken emergently for CT and I met her in the CT scanner.  The CT revealed some early change in the cerebellum.  I discussed risks and benefits of IV tPA with the patient.  Given that we are now in the extended time window, already have change on CT, and her symptoms are quite mild, I am not certain that the benefit of tPA outweighs the risks.  I discussed this with patient and her husband who expressed understanding and agreed with not proceeding.   LKW: 9:45 PM tpa given?: no, mild symptoms NIH stroke scale: Two    ROS: A 14 point ROS was performed and is negative except as noted in the HPI.   Past Medical History:  Diagnosis Date  . Disturbance of skin sensation   . Myalgia and myositis, unspecified   . Other B-complex deficiencies   . 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 Stroke ([place])   . Unspecified essential hypertension      Family History  Problem Relation Age of Onset  . Pulmonary embolism Mother   . Hepatitis Father   . Diabetes Father   . Diabetes Mellitus II Sister   . Breast cancer Neg Hx      Social History:  reports that she has never smoked. She has never used smokeless tobacco. She reports that she does not drink  alcohol and does not use drugs.   Exam: Current vital signs: BP (!) 141/72   Pulse 90   Temp 98.6 F (37 C) (Oral)   Resp (!) 21   Ht 5' 2"  (1.575 m)   Wt 62.1 kg   LMP 12/08/2010   SpO2 100%   BMI 25.04 kg/m  Vital signs in last 24 hours: Temp:  [98.6 F (37 C)] 98.6 F (37 C) (05/09 2256) Pulse Rate:  [66-90] 90 (05/10 0107) Resp:  [16-21] 21 (05/10 0107) BP: (133-160)/(71-80) 141/72 (05/10 0107) SpO2:  [98 %-100 %] 100 % (05/10 0107) Weight:  [58.7 kg-62.1 kg] 62.1 kg (05/10 0047)   Physical Exam  Constitutional: Appears well-developed and well-nourished.  Psych: Affect appropriate to situation Eyes: No scleral injection HENT: No OP obstruction MSK: no joint deformities.  Cardiovascular: Normal rate and regular rhythm.  Respiratory: Effort normal, non-labored breathing GI: Soft.  No distension. There is no tenderness.  Skin: WDI  Neuro: Mental Status: Patient is awake, alert, oriented to person, place, month, year, and situation. Patient is able to give a clear and coherent history. No signs of aphasia or neglect Cranial Nerves: II: Visual Fields are full. Pupils are equal, round, and reactive to light.   III,IV, VI: EOMI without ptosis or diploplia.  V: Facial sensation is symmetric to temperature VII: Facial movement is symmetric.  VIII: hearing is intact  to voice X: Uvula elevates symmetrically XI: Shoulder shrug is symmetric. XII: tongue is midline without atrophy or fasciculations.  Motor: Tone is normal. Bulk is normal. 5/5 strength was present in all four extremities.  Sensory: Sensation is symmetric to light touch and temperature in the arms and legs. Deep Tendon Reflexes: 2+ and symmetric in the biceps and patellae.  Plantars: Toes are downgoing bilaterally.  Cerebellar: FNF and HKS are intact on the left, impaired on the right arm >right leg    I have reviewed labs in epic and the results pertinent to this consultation are: Creatinine  0.4  I have reviewed the images obtained:CT - R cerebellar infarct.   CTA - no acute LVO  Impression: 60 year old female with acute right cerebellar stroke.  Her symptoms are quite mild, and when coupled with the presence of CT change and being in the extended time window, I discussed with the patient and her husband that I did not recommend tPA at this time. There is concern for dissection on CTA, and I would favor dual antiplatelets in any case at this time.   Recommendations: - HgbA1c, fasting lipid panel - MRI  of the brain without contrast - Frequent neuro checks - Echocardiogram - Prophylactic therapy-Antiplatelet med: Aspirin - dose 81 mg, Plavix 75 mg daily after 370m load.  - Risk factor modification - Telemetry monitoring - PT consult, OT consult, Speech consult - Stroke team to follow   MRoland Rack MD [facility] [phone] If 7pm- 7am, please page neurology on call as listed in [facility]

## 2021-02-23 NOTE — H&P (Addendum)
History and Physical  Judy Gutierrez BJS:[id number] DOB: Sep 21, 1961 DOA: 02/22/2021  Referring physician: Dr. Judd Lien, EDP PCP: Zola Button, Grayling Congress, DO  Outpatient Specialists: Neurology, cardiology. Patient coming from: Home.  Chief Complaint: Dizziness, nausea and vomiting.  HPI: Judy Gutierrez is a 60 y.o. female with medical history significant for prior CVA received tPA in April 2021, essential hypertension, who presented to [facility] ED from home with similar symptoms as her prior stroke.  Last known well before 9:30 PM.  Patient had gone for a walk, returned and was sitting on the chair when she suddenly felt dizzy, spinning sensation, with nausea and vomiting.  She came to the ED for further evaluation.  A code stroke was called and patient was sent for head CT.  Patient was seen by stroke team.  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  Decision was made to withhold tPA due to risk of bleeding.  TRH, hospitalist team, was asked to admit.  ED Course:  Afebrile.  BP 152/66, pulse 65, respiratory rate 19, O2 saturation 98% on room air.  Lab studies remarkable for WBC 11.2.  BMP is essentially unremarkable.  Review of Systems: Review of systems as noted in the HPI. All other systems reviewed and are negative.   Past Medical History:  Diagnosis Date  . Disturbance of skin sensation   . Myalgia and myositis, unspecified   . Other B-complex deficiencies   . 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 Stroke (HCC)   . Unspecified essential hypertension    Past Surgical History:  Procedure Laterality Date  . BUBBLE STUDY  03/05/2020   Procedure: BUBBLE STUDY;  Surgeon: [name], MD;  Location: [facility] ENDOSCOPY;  Service: Cardiovascular;;  . CESAREAN SECTION  1992  . LOOP RECORDER INSERTION N/A 03/05/2020   Procedure: LOOP RECORDER INSERTION;  Surgeon: [name], MD;  Location: [facility] INVASIVE CV LAB;  Service: Cardiovascular;  Laterality: N/A;  . TEE  WITHOUT CARDIOVERSION N/A 03/05/2020   Procedure: TRANSESOPHAGEAL ECHOCARDIOGRAM (TEE);  Surgeon: [name], MD;  Location: [facility] ENDOSCOPY;  Service: Cardiovascular;  Laterality: N/A;    Social History:  reports that she has never smoked. She has never used smokeless tobacco. She reports that she does not drink alcohol and does not use drugs.   No Known Allergies  Family History  Problem Relation Age of Onset  . Pulmonary embolism Mother   . Hepatitis Father   . Diabetes Father   . Diabetes Mellitus II Sister   . Breast cancer Neg Hx       Prior to Admission medications   Medication Sig Start Date End Date Taking? Authorizing Provider  amLODipine (NORVASC) 5 MG tablet Take 1 tablet (5 mg total) by mouth daily. 12/04/20   [name], DO  aspirin 81 MG EC tablet Take 1 tablet (81 mg total) by mouth daily. 02/28/20   [name], DO  atorvastatin (LIPITOR) 40 MG tablet Take 1 tablet (40 mg total) by mouth daily. 12/04/20   [name], DO  Calcium-Magnesium-Vitamin D (CALCIUM MAGNESIUM PO) Take by mouth daily.    [provider]  losartan (COZAAR) 50 MG tablet Take 1 tablet (50 mg total) by mouth daily. 12/04/20   [name], DO  Multiple Minerals-Vitamins (MULTISOURCE CALCIUM MAG/D) TABS Take 1 tablet by mouth daily.    [provider]  Multiple Vitamin (MULTIVITAMIN WITH MINERALS) TABS tablet Take 1 tablet by  mouth daily.    [provider]    Physical Exam: BP (!) 141/72   Pulse 90   Temp 98.6 F (37 C) (Oral)   Resp (!) 21   Ht 5\' 2"  (1.575 m)   Wt 62.1 kg   LMP 12/08/2010   SpO2 100%   BMI 25.04 kg/m   . General: 60 y.o. year-old female well developed well nourished in no acute distress.  Somnolent but easily arousable to voices. . Cardiovascular: Regular rate and rhythm with no rubs or gallops.  No thyromegaly or JVD noted.  No lower extremity edema. 2/4 pulses in all 4  extremities. Marland Kitchen. Respiratory: Clear to auscultation with no wheezes or rales. Good inspiratory effort. . Abdomen: Soft nontender nondistended with normal bowel sounds x4 quadrants. . Muskuloskeletal: No cyanosis, clubbing or edema noted bilaterally . Neuro: CN II-XII intact, strength, sensation, reflexes . Skin: No ulcerative lesions noted or rashes . Psychiatry: Judgement and insight appear normal.  Unable to assess mood due to somnolence.         Labs on Admission:  Basic Metabolic Panel: Recent Labs  Lab 02/22/21 2257 02/23/21 0045  NA 136 139  K 2.8* 3.9  CL 106 104  CO2 24  --   GLUCOSE 120* 139*  BUN 13 14  CREATININE 0.53 0.40*  CALCIUM 8.7*  --    Liver Function Tests: No results for input(s): AST, ALT, ALKPHOS, BILITOT, PROT, ALBUMIN in the last 168 hours. No results for input(s): LIPASE, AMYLASE in the last 168 hours. No results for input(s): AMMONIA in the last 168 hours. CBC: Recent Labs  Lab 02/22/21 2257 02/23/21 0045  WBC 11.2*  --   NEUTROABS 4.2  --   HGB 12.3 12.6  HCT 37.1 37.0  MCV 87.5  --   PLT 263  --    Cardiac Enzymes: No results for input(s): CKTOTAL, CKMB, CKMBINDEX, TROPONINI in the last 168 hours.  BNP (last 3 results) No results for input(s): BNP in the last 8760 hours.  ProBNP (last 3 results) No results for input(s): PROBNP in the last 8760 hours.  CBG: No results for input(s): GLUCAP in the last 168 hours.  Radiological Exams on Admission: CT HEAD CODE STROKE WO CONTRAST  Result Date: 02/23/2021 CLINICAL DATA:  Code stroke. Initial evaluation for neuro deficit, stroke suspected. EXAM: CT HEAD WITHOUT CONTRAST TECHNIQUE: Contiguous axial images were obtained from the base of the skull through the vertex without intravenous contrast. COMPARISON:  Prior CT from 01/31/2020. FINDINGS: Brain: Streak/beam hardening artifact emanating from the left occipital calvarium noted, mildly limiting assessment. Cerebral volume within normal limits.  Focal encephalomalacia at the central and left superior cerebellum, consistent with a chronic left MCA distribution infarct. Subtle evolving hypodensity noted involving the superior right cerebellum and cerebellar vermis, concerning for an acute right SCA distribution infarct (series 3, image 12, 11). No associated hemorrhage or mass effect. No other evidence for acute large vessel territory infarct. No intracranial hemorrhage. No mass lesion, mass effect or midline shift. No hydrocephalus or extra-axial fluid collection. Vascular: No hyperdense vessel. Skull: Scalp soft tissues demonstrate no acute finding. Small focal exostosis noted at the right frontal calvarium. Calvarium intact. Sinuses/Orbits: Globes and orbital soft tissues within normal limits. Moderate left sphenoid and maxillary sinusitis. Additional scattered pneumatized secretions noted within the ethmoidal air cells. Subcentimeter osteoma noted within the right ethmoidal air cells as well. Mastoid air cells are clear. Other: None. ASPECTS [facility]([place] Stroke Program Early CT Score) - Ganglionic  level infarction (caudate, lentiform nuclei, internal capsule, insula, M1-M3 cortex): 7 - Supraganglionic infarction (M4-M6 cortex): 3 Total score (0-10 with 10 being normal): 10 IMPRESSION: 1. Evolving hypodensity involving the superior right cerebellum/cerebellar vermis, concerning for an acute right SCA distribution infarct. No intracranial hemorrhage or mass effect. 2. ASPECTS is 10. 3. Underlying chronic left SCA distribution infarcts. Critical Value/emergent results were called by telephone at the time of interpretation on 02/23/2021 at 12:55 a.m. to provider Dr. Amada Jupiter, Who verbally acknowledged these results. Electronically Signed   By: Rise Mu M.D.   On: 02/23/2021 01:23   CT ANGIO HEAD NECK W WO CM (CODE STROKE)  Result Date: 02/23/2021 CLINICAL DATA:  Initial evaluation for neuro deficit, stroke suspected. EXAM: CT ANGIOGRAPHY HEAD  AND NECK TECHNIQUE: Multidetector CT imaging of the head and neck was performed using the standard protocol during bolus administration of intravenous contrast. Multiplanar CT image reconstructions and MIPs were obtained to evaluate the vascular anatomy. Carotid stenosis measurements (when applicable) are obtained utilizing NASCET criteria, using the distal internal carotid diameter as the denominator. CONTRAST:  68mL OMNIPAQUE IOHEXOL 350 MG/ML SOLN COMPARISON:  Prior head CT from earlier the same day. FINDINGS: CTA NECK FINDINGS Aortic arch: Visualized aortic arch normal in caliber with normal branch pattern. No stenosis about the origin of the great vessels. Right carotid system: Right common carotid artery patent from its origin to the bifurcation without stenosis. Minimal plaque about the right carotid bulb without stenosis. Right ICA patent distally without stenosis, dissection or occlusion. Left carotid system: Left CCA patent from its origin to the bifurcation without stenosis. Minimal plaque about the left carotid bulb without significant stenosis. Left ICA patent distally without stenosis, dissection or occlusion. Vertebral arteries: Left vertebral artery arises directly from the aortic arch. Right vertebral artery dominant. Vertebral arteries widely patent within the neck. Just prior to the skull base/dural reflection, there is distal right V3 segment (series 9, image 83), suspicious for a possible short-segment dissection. Small amount of probable intraluminal thrombus protrudes into the vascular lumen at this level. No frank raised dissection flap is seen. No more than mild stenosis at this level. The right vertebral artery is relatively normal just distally as it crosses into the cranial vault. Skeleton: No acute osseous finding. No discrete or worrisome osseous lesions. Mild cervical spondylosis noted at C5-6 without significant stenosis. Other neck: No other acute soft tissue abnormality within the  neck. No mass or adenopathy. Upper chest: Visualized upper chest demonstrates no acute finding. Review of the MIP images confirms the above findings CTA HEAD FINDINGS Anterior circulation: Petrous, cavernous, and supraclinoid segments patent without stenosis or other abnormality. A1 segments widely patent. Normal anterior communicating artery complex. Anterior cerebral arteries patent to their distal aspects without stenosis. No M1 stenosis or occlusion. Normal MCA bifurcations. Distal MCA branches well perfused and symmetric. Posterior circulation: Both V4 segments patent to the vertebrobasilar junction without stenosis. Both PICA origins patent and normal. Basilar somewhat diminutive but patent to its distal aspect without stenosis. Left superior cerebral artery patent. There is occlusion of the mid-distal right superior cerebellar artery, likely embolic (series 8, image 105). Left PCA supplied via the basilar as well as a prominent left posterior communicating artery. Predominant fetal type origin of the right PCA. Both PCAs remain widely patent to their distal aspects. Venous sinuses: Grossly patent allowing for timing the contrast bolus. Anatomic variants: Predominant fetal type origin of the right PCA. Review of the MIP images confirms the above findings  IMPRESSION: 1. Focal intimal irregularity involving the distal right V3 segment, likely reflecting a short-segment dissection. No frank raised dissection flap or significant luminal narrowing. 2. 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 3. Mild for age atheromatous disease elsewhere about the major arterial vasculature of the head and neck. No other hemodynamically significant or correctable stenosis. These results were communicated to Dr. Amada Jupiter at 1:22 amon 5/10/2022by text page via the [facility] messaging system. Electronically Signed   By: Rise Mu M.D.   On: 02/23/2021 01:38    EKG: I  independently viewed the EKG done and my findings are as followed: Sinus rhythm rate of 83, nonspecific ST-T changes.  QTc 472.  Assessment/Plan Present on Admission: . 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 Started on DAPT aspirin and Plavix. Obtain fasting lipid panel, hemoglobin A1c Permissive hypertension treat SBP greater than 220 and DBP greater than 120. Add high intensity statin, increased home dose from 40 mg to 80 mg daily of Lipitor. PT/OT/speech therapist assessment Frequent neurochecks MRI brain without contrast 2D echo Telemetry monitoring. Patient has an implantable loop recorder in place.  Essential hypertension Hold off home oral antihypertensives Ongoing permissive hypertension Treat SBP greater than 220 and DBP greater than 120. Continue to closely monitor vital signs.  Leukocytosis Suspect reactive in the setting of acute stroke Nonseptic appearing UA negative for pyuria  Nausea and vomiting likely from acute cerebellar stroke IV antiemetics as needed N.p.o. until passes swallow evaluation by speech therapist.   DVT prophylaxis: Subcu Lovenox daily.  Code Status: Full code.  Family Communication: Husband at bedside.  Disposition Plan: Admit to telemetry medical.  Consults called: Neurology/stroke team.  Admission status: Inpatient status.  Patient will require at least 2 midnights for further evaluation and treatment of present condition.   Status is: Inpatient    Dispo:  Patient From: Home  Planned Disposition: Home  Medically stable for discharge: No         Darlin Drop MD [facility] Pager [phone]  If 7PM-7AM, please contact night-coverage [email or web address] Password [email or web address]  02/23/2021, 1:52 AM

## 2021-02-23 NOTE — Code Documentation (Signed)
Stroke Response Nurse Documentation Code Documentation  Judy Gutierrez is a 60 y.o. female arriving to [facility] ED via Private Vehicle on 5/10 with past medical hx of Left Cerebellar stroke and HTN. Code stroke was activated by ED. Patient from home where she was LKW at 2100 and now complaining of dizzyness, vomiting and inability to walk. On aspirin 81 mg daily. Stroke team at the bedside on patient arrival. Labs drawn and patient cleared for CT by Dr. Judd Lien. Patient to CT with team. NIHSS 2, see documentation for details and code stroke times. Patient with right limb ataxia on exam. The following imaging was completed:  CTA head and neck. Patient is not a candidate for tPA due to extended time window and the presence of change on CT scan. Bedside handoff with ED RN Delorise Shiner.    Rose Fillers  Rapid Response RN

## 2021-02-23 NOTE — Progress Notes (Addendum)
PROGRESS NOTE   Judy Gutierrez  HYW:[id number]    DOB: Jan 08, 1961    DOA: 02/22/2021  PCP: [name], DO   I have briefly reviewed patients previous medical records in [facility].  Chief Complaint  Patient presents with  . Dizziness  . Nausea  . Emesis  . 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 [name], underwent extensive evaluation including negative TEE on 03/05/2020 and loop recorder insertion, was on DAPT for 3 weeks followed by aspirin alone; hypertension, hyperlipidemia presented to [facility] ED with similar complaints as her prior stroke including dizziness, vertigo, nausea and vomiting.  LKW 9:30 PM on 5/9.  Code stroke activated.  Seen by neurology.  Admitted for acute right cerebellar stroke with concern for dissection on CTA, tPA deferred, treating with DAPT.  Neurology/stroke team consulting.   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  No intracranial hemorrhage or mass-effect.  Underlying chronic left ACA distribution infarct.  CTA head and neck: Focal intimal irregularity involving the distal right V3 segment, likely reflecting a short segment dissection.  No frank wrist dissection flap or significant luminal narrowing.  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  No other hemodynamically significant or correctable stenosis.  MRI brain: 4-5 cm region of acute infarction within the superior cerebellum on the right.   Punctate acute infarction in the right dorsal midbrain.  Mild swelling.  No evidence of acute hemorrhage.  Old infarction in the left superior cerebellum with atrophy, encephalomalacia and gliosis and hemosiderin deposition.  Stroke team did not recommend tPA due to mild symptoms, being in the extended time window and concern for bleeding.  Was on aspirin 81 mg daily PTA, now started on DAPT aspirin and Plavix.  LDL 51.  A1c 6.  Both at goal.  Permissive hypertension treat SBP greater than 220 and DBP greater than 120.  Add high intensity statin, increased home dose from 40 mg to 80 mg daily of Lipitor.  PT/OT/speech therapist assessment are pending.  Follow 2D echo   Telemetry monitoring shows SR. I communicated with [facility] card master who had the loop recorder interrogated on 5/10 and was reported as no A. fib.  Still quite symptomatic with ongoing nausea and emesis on sitting up.  Essential hypertension  Hold off home oral antihypertensives  Ongoing permissive hypertension for 24 to 48 hours poststroke and then gradually normalize blood pressure in 3 to 5 days.  Treat SBP greater than 220 or DBP greater than 120.  Hyperlipidemia  LDL 51.  Atorvastatin increased from 40 to 80 mg daily.  Check LFT's now and recommend repeating fasting lipids and LFTs in 4 to 6 weeks.  Leukocytosis  Suspect reactive in the setting of acute stroke  No clinical concern for infectious etiology.  UA negative for pyuria  Nausea and vomiting likely from acute cerebellar stroke  IV antiemetics as needed  As per floor RN, passed bedside RN swallow screen and has been ordered a diet.  Gentle IVF until able to consistently tolerate  oral intake.  Hypokalemia  Replaced.  Magnesium normal.  Body mass index is 25.04 kg/m.    DVT prophylaxis: enoxaparin (LOVENOX) injection 40 mg Start: 02/23/21 1000     Code Status: Full Code Family Communication: I called patient's spouse  via phone, updated care and answered all questions.  I encouraged him to speak with Dr. [name] for further details regarding her recurrent stroke.  He was appreciative of the call. Disposition:  Status is: Inpatient  Remains inpatient appropriate because:Inpatient level of care appropriate due to severity of illness   Dispo:  Patient From: Home  Planned Disposition: Home  Medically stable for discharge: No          Consultants:   Neurology  Procedures:   None  Antimicrobials:    Anti-infectives (From admission, onward)   None        Subjective:  Patient seen this morning while still in the ED.  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  No residual deficits from prior stroke.  Currently with new onset slurred speech which is somewhat better than on admission.  No vomiting since midnight when I saw her but has since vomited with ED RN.  Also reports blurry vision but no diplopia or vertigo.  No asymmetric limb weakness or numbness.  Objective:   Vitals:   02/23/21 0230 02/23/21 0300 02/23/21 0600 02/23/21 0900  BP: 138/78 (!) 145/76 135/72 (!) 151/76  Pulse: 64 66 64 70  Resp: 16 18 19 18   Temp:    98.2 F (36.8 C)  TempSrc:    Oral  SpO2: 99% 98% 98% 100%  Weight:      Height:        General exam: Young female, moderately built and nourished lying comfortably propped up in bed without distress. Respiratory system: Clear to auscultation. Respiratory effort normal. Cardiovascular system: S1 & S2 heard, RRR. No JVD, murmurs, rubs, gallops or clicks. No pedal edema. Gastrointestinal system: Abdomen is nondistended, soft and nontender. No organomegaly or masses felt. Normal bowel sounds heard. Central nervous system: Alert and oriented.  Mild dysarthria.  No facial asymmetry.  Symmetric 5 x 5 power.?  Dysdiadochokinesia in upper extremities.  No clear nystagmus reproducible. Extremities: Symmetric 5 x 5 power. Skin: No rashes, lesions or  ulcers Psychiatry: Judgement and insight appear normal. Mood & affect appropriate.     Data Reviewed:   I have personally reviewed following labs and imaging studies   CBC: Recent Labs  Lab 02/22/21 2257 02/23/21 0045 02/23/21 0901  WBC 11.2*  --  6.9  NEUTROABS 4.2  --   --   HGB 12.3 12.6 12.6  HCT 37.1 37.0 38.5  MCV 87.5  --  86.9  PLT 263  --  238    Basic Metabolic Panel: Recent Labs  Lab 02/22/21 2257 02/23/21 0045 02/23/21 0745  NA 136 139 134*  K 2.8* 3.9 3.8  CL 106 104 105  CO2 24  --  22  GLUCOSE 120* 139* 123*  BUN 13 14 9   CREATININE 0.53 0.40* 0.54  CALCIUM 8.7*  --  8.4*  MG  --   --  1.9  PHOS  --   --  3.6    Liver Function Tests: No results for input(s): AST, ALT, ALKPHOS, BILITOT, PROT, ALBUMIN in the last 168 hours.  CBG: Recent Labs  Lab 02/23/21 0259  GLUCAP 126*    Microbiology Studies:   Recent Results (from the past 240 hour(s))  Resp  Panel by RT-PCR (Flu A&B, Covid) Nasopharyngeal Swab     Status: None   Collection Time: 02/23/21  1:22 AM   Specimen: Nasopharyngeal Swab; Nasopharyngeal(NP) swabs in vial transport medium  Result Value Ref Range Status   SARS Coronavirus 2 by RT PCR NEGATIVE NEGATIVE Final    Comment: (NOTE) SARS-CoV-2 target nucleic acids are NOT DETECTED.  The SARS-CoV-2 RNA is generally detectable in upper respiratory specimens during the acute phase of infection. The lowest concentration of SARS-CoV-2 viral copies this assay can detect is 138 copies/mL. A negative result does not preclude SARS-Cov-2 infection and should not be used as the sole basis for treatment or other patient management decisions. A negative result may occur with  improper specimen collection/handling, submission of specimen other than nasopharyngeal swab, presence of viral mutation(s) within the areas targeted by this assay, and inadequate number of viral copies(<138 copies/mL). A negative result must be combined with clinical  observations, patient history, and epidemiological information. The expected result is Negative.  Fact Sheet for Patients:  BloggerCourse.com  Fact Sheet for Healthcare Providers:  SeriousBroker.it  This test is no t yet approved or cleared by the [place] FDA and  has been authorized for detection and/or diagnosis of SARS-CoV-2 by FDA under an Emergency Use Authorization (EUA). This EUA will remain  in effect (meaning this test can be used) for the duration of the COVID-19 declaration under Section 564(b)(1) of the Act, 21 U.S.C.section 360bbb-3(b)(1), unless the authorization is terminated  or revoked sooner.       Influenza A by PCR NEGATIVE NEGATIVE Final   Influenza B by PCR NEGATIVE NEGATIVE Final    Comment: (NOTE) The Xpert Xpress SARS-CoV-2/FLU/RSV plus assay is intended as an aid in the diagnosis of influenza from Nasopharyngeal swab specimens and should not be used as a sole basis for treatment. Nasal washings and aspirates are unacceptable for Xpert Xpress SARS-CoV-2/FLU/RSV testing.  Fact Sheet for Patients: BloggerCourse.com  Fact Sheet for Healthcare Providers: SeriousBroker.it  This test is not yet approved or cleared by the [place] FDA and has been authorized for detection and/or diagnosis of SARS-CoV-2 by FDA under an Emergency Use Authorization (EUA). This EUA will remain in effect (meaning this test can be used) for the duration of the COVID-19 declaration under Section 564(b)(1) of the Act, 21 U.S.C. section 360bbb-3(b)(1), unless the authorization is terminated or revoked.  Performed at [facility], [address] [place], [place] [address]      Radiology Studies:  MR BRAIN WO CONTRAST  Result Date: 02/23/2021 CLINICAL DATA:  Dizziness, nausea and vomiting. EXAM: MRI HEAD WITHOUT CONTRAST TECHNIQUE: Multiplanar, multiecho pulse  sequences of the brain and surrounding structures were obtained without intravenous contrast. COMPARISON:  CT studies same day FINDINGS: Brain: Diffusion imaging shows a 4-5 cm region of acute infarction within the superior cerebellum on the right. Acute infarction also within the dorsal right midbrain. Old infarction within the left superior cerebellum. Mild swelling but no acute hemorrhage in the right cerebellum. Old hemosiderin residual within the left cerebellar stroke. Cerebral hemispheres are normal. No mass, hydrocephalus or extra-axial collection. Vascular: No acute vascular finding by standard MRI. Skull and upper cervical spine: Negative Sinuses/Orbits: Mucosal inflammatory changes of the left maxillary and sphenoid sinuses. Orbits negative. Other: None IMPRESSION: 4-5 cm region of acute infarction within the superior cerebellum on the right. Punctate acute infarction in the right dorsal midbrain. Mild swelling. No evidence of acute hemorrhage. Old infarction in the left superior  cerebellum with atrophy, encephalomalacia and gliosis and hemosiderin deposition. Electronically Signed   By: Paulina Fusi M.D.   On: 02/23/2021 08:42   CT HEAD CODE STROKE WO CONTRAST  Result Date: 02/23/2021 CLINICAL DATA:  Code stroke. Initial evaluation for neuro deficit, stroke suspected. EXAM: CT HEAD WITHOUT CONTRAST TECHNIQUE: Contiguous axial images were obtained from the base of the skull through the vertex without intravenous contrast. COMPARISON:  Prior CT from 01/31/2020. FINDINGS: Brain: Streak/beam hardening artifact emanating from the left occipital calvarium noted, mildly limiting assessment. Cerebral volume within normal limits. Focal encephalomalacia at the central and left superior cerebellum, consistent with a chronic left MCA distribution infarct. Subtle evolving hypodensity noted involving the superior right cerebellum and cerebellar vermis, concerning for an acute right SCA distribution infarct (series  3, image 12, 11). No associated hemorrhage or mass effect. No other evidence for acute large vessel territory infarct. No intracranial hemorrhage. No mass lesion, mass effect or midline shift. No hydrocephalus or extra-axial fluid collection. Vascular: No hyperdense vessel. Skull: Scalp soft tissues demonstrate no acute finding. Small focal exostosis noted at the right frontal calvarium. Calvarium intact. Sinuses/Orbits: Globes and orbital soft tissues within normal limits. Moderate left sphenoid and maxillary sinusitis. Additional scattered pneumatized secretions noted within the ethmoidal air cells. Subcentimeter osteoma noted within the right ethmoidal air cells as well. Mastoid air cells are clear. Other: None. ASPECTS [facility] Stroke Program Early CT Score) - Ganglionic level infarction (caudate, lentiform nuclei, internal capsule, insula, M1-M3 cortex): 7 - Supraganglionic infarction (M4-M6 cortex): 3 Total score (0-10 with 10 being normal): 10 IMPRESSION: 1. Evolving hypodensity involving the superior right cerebellum/cerebellar vermis, concerning for an acute right SCA distribution infarct. No intracranial hemorrhage or mass effect. 2. ASPECTS is 10. 3. Underlying chronic left SCA distribution infarcts. Critical Value/emergent results were called by telephone at the time of interpretation on 02/23/2021 at 12:55 a.m. to provider Dr. Amada Jupiter, Who verbally acknowledged these results. Electronically Signed   By: Rise Mu M.D.   On: 02/23/2021 01:23   CT ANGIO HEAD NECK W WO CM (CODE STROKE)  Result Date: 02/23/2021 CLINICAL DATA:  Initial evaluation for neuro deficit, stroke suspected. EXAM: CT ANGIOGRAPHY HEAD AND NECK TECHNIQUE: Multidetector CT imaging of the head and neck was performed using the standard protocol during bolus administration of intravenous contrast. Multiplanar CT image reconstructions and MIPs were obtained to evaluate the vascular anatomy. Carotid stenosis measurements  (when applicable) are obtained utilizing NASCET criteria, using the distal internal carotid diameter as the denominator. CONTRAST:  75mL OMNIPAQUE IOHEXOL 350 MG/ML SOLN COMPARISON:  Prior head CT from earlier the same day. FINDINGS: CTA NECK FINDINGS Aortic arch: Visualized aortic arch normal in caliber with normal branch pattern. No stenosis about the origin of the great vessels. Right carotid system: Right common carotid artery patent from its origin to the bifurcation without stenosis. Minimal plaque about the right carotid bulb without stenosis. Right ICA patent distally without stenosis, dissection or occlusion. Left carotid system: Left CCA patent from its origin to the bifurcation without stenosis. Minimal plaque about the left carotid bulb without significant stenosis. Left ICA patent distally without stenosis, dissection or occlusion. Vertebral arteries: Left vertebral artery arises directly from the aortic arch. Right vertebral artery dominant. Vertebral arteries widely patent within the neck. Just prior to the skull base/dural reflection, there is distal right V3 segment (series 9, image 83), suspicious for a possible short-segment dissection. Small amount of probable intraluminal thrombus protrudes into the vascular lumen at this level.  No frank raised dissection flap is seen. No more than mild stenosis at this level. The right vertebral artery is relatively normal just distally as it crosses into the cranial vault. Skeleton: No acute osseous finding. No discrete or worrisome osseous lesions. Mild cervical spondylosis noted at C5-6 without significant stenosis. Other neck: No other acute soft tissue abnormality within the neck. No mass or adenopathy. Upper chest: Visualized upper chest demonstrates no acute finding. Review of the MIP images confirms the above findings CTA HEAD FINDINGS Anterior circulation: Petrous, cavernous, and supraclinoid segments patent without stenosis or other abnormality. A1  segments widely patent. Normal anterior communicating artery complex. Anterior cerebral arteries patent to their distal aspects without stenosis. No M1 stenosis or occlusion. Normal MCA bifurcations. Distal MCA branches well perfused and symmetric. Posterior circulation: Both V4 segments patent to the vertebrobasilar junction without stenosis. Both PICA origins patent and normal. Basilar somewhat diminutive but patent to its distal aspect without stenosis. Left superior cerebral artery patent. There is occlusion of the mid-distal right superior cerebellar artery, likely embolic (series 8, image 105). Left PCA supplied via the basilar as well as a prominent left posterior communicating artery. Predominant fetal type origin of the right PCA. Both PCAs remain widely patent to their distal aspects. Venous sinuses: Grossly patent allowing for timing the contrast bolus. Anatomic variants: Predominant fetal type origin of the right PCA. Review of the MIP images confirms the above findings IMPRESSION: 1. Focal intimal irregularity involving the distal right V3 segment, likely reflecting a short-segment dissection. No frank raised dissection flap or significant luminal narrowing. 2. 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 3. Mild for age atheromatous disease elsewhere about the major arterial vasculature of the head and neck. No other hemodynamically significant or correctable stenosis. These results were communicated to Dr. Amada JupiterKirkpatrick at 1:22 amon 5/10/2022by text page via the [facility] [employer]PM[employer] messaging system. Electronically Signed   By: Rise MuBenjamin  McClintock M.D.   On: 02/23/2021 01:38     Scheduled Meds:   . aspirin  325 mg Oral Daily  . atorvastatin  80 mg Oral Daily  . [START ON 02/24/2021] clopidogrel  75 mg Oral Daily  . enoxaparin (LOVENOX) injection  40 mg Subcutaneous Q24H    Continuous Infusions:     LOS: 0 days     Marcellus ScottAnand Samyuktha Brau, MD, LorimorFACP, [facility] SurgeryFHM. [facility]    To contact the attending provider between 7A-7P or the covering provider during after hours 7P-7A, please log into the web site [email or web address] and access using universal [place] password for that web site. If you do not have the password, please call the hospital operator.  02/23/2021, 10:39 AM

## 2021-02-23 NOTE — ED Notes (Signed)
When attempted to sit pt up for swallow screen she started to feel nauseous and vomit again. Will attempt again when she is feeling better. Her PRN zofran is not due at this time

## 2021-02-23 NOTE — ED Notes (Signed)
Transported to MRI

## 2021-02-24 DIAGNOSIS — E871 Hypo-osmolality and hyponatremia: Secondary | ICD-10-CM

## 2021-02-24 DIAGNOSIS — E785 Hyperlipidemia, unspecified: Secondary | ICD-10-CM

## 2021-02-24 DIAGNOSIS — R7303 Prediabetes: Secondary | ICD-10-CM

## 2021-02-24 DIAGNOSIS — I639 Cerebral infarction, unspecified: Secondary | ICD-10-CM

## 2021-02-24 DIAGNOSIS — I10 Essential (primary) hypertension: Secondary | ICD-10-CM

## 2021-02-24 DIAGNOSIS — Z8673 Personal history of transient ischemic attack (TIA), and cerebral infarction without residual deficits: Secondary | ICD-10-CM

## 2021-02-24 LAB — CARDIOLIPIN ANTIBODIES, IGG, IGM, IGA
Anticardiolipin IgA: 9 APL U/mL (ref 0–11)
Anticardiolipin IgG: <9 GPL U/mL (ref 0–14)
Anticardiolipin IgM: <9 MPL U/mL (ref 0–12)

## 2021-02-24 LAB — PROTEIN C, TOTAL: Protein C, Total: 102 % (ref 60–150)

## 2021-02-24 LAB — BETA-2-GLYCOPROTEIN I ABS, IGG/M/A
Beta-2 Glyco I IgG: <9 GPI IgG units (ref 0–20)
Beta-2-Glycoprotein I IgA: <9 GPI IgA units (ref 0–25)
Beta-2-Glycoprotein I IgM: <9 GPI IgM units (ref 0–32)

## 2021-02-24 LAB — PROTEIN S, TOTAL: Protein S Ag, Total: 92 % (ref 60–150)

## 2021-02-24 LAB — HOMOCYSTEINE: Homocysteine: 6.6 umol/L (ref 0.0–14.5)

## 2021-02-24 LAB — LUPUS ANTICOAGULANT PANEL
DRVVT: 32.8 s (ref 0.0–47.0)
PTT Lupus Anticoagulant: 33.1 s (ref 0.0–51.9)

## 2021-02-24 LAB — PROTEIN S ACTIVITY: Protein S Activity: 86 % (ref 63–140)

## 2021-02-24 LAB — PROTEIN C ACTIVITY: Protein C Activity: 113 % (ref 73–180)

## 2021-02-24 NOTE — Plan of Care (Signed)

## 2021-02-24 NOTE — Progress Notes (Signed)
Physical Therapy Evaluation Patient Details Name: Judy Gutierrez MRN: [id number] DOB: [birth year] Today's Date: 02/24/2021   History of Present Illness  60 yo female presents to [facility] on 5/9 with vertigo, N/V. CTH shows evolving hypodensity involving superior R cerebellum/cerebellar vermis concerning for acute R SCA CVA. MR brain shows 4-5 cm region of acute infarction within the superior cerebellum on the right. Punctate acute infarction in the right dorsal midbrain No TPA given. PMH includes prior embolic cerebellar CVA 2021, HTN, pelvic fx 2015.  Clinical Impression   Pt presents with R-sided incoordination, ataxic gait, diplopia with mobility, impaired balance putting pt at high risk of falls, impulsivity with mobility, and decreased activity tolerance vs baseline. Pt to benefit from acute PT to address deficits. Pt ambulated x3 repeated room distances, requiring mod verbal and tactile cuing for safety as well as steadying assist. Pt is independent at home at baseline, recommending CIR to address deficits and maximize pt's functional recovery prior to d/c home. PT to progress mobility as tolerated, and will continue to follow acutely.      Follow Up Recommendations CIR    Equipment Recommendations  None recommended by PT    Recommendations for Other Services       Precautions / Restrictions Precautions Precautions: Fall Precaution Comments: Diplopia, L eye when looking L laterally and downwards - occlusion glasses utilized Restrictions Weight Bearing Restrictions: No      Mobility  Bed Mobility Overal bed mobility: Needs Assistance Bed Mobility: Supine to Sit     Supine to sit: Min guard     General bed mobility comments: for safety, increased time with use of bedrails to perform    Transfers Overall transfer level: Needs assistance Equipment used: 1 person hand held assist Transfers: Sit to/from Stand Sit to Stand: Min assist         General transfer comment: min  assist to rise and steady, pt immediately reaching for environment to self-steady. STS x3, from EOB x2 and toilet x1.  Ambulation/Gait Ambulation/Gait assistance: Min assist Gait Distance (Feet): 60 Feet Assistive device: 1 person hand held assist;IV Pole Gait Pattern/deviations: Step-through pattern;Decreased stride length;Ataxic;Narrow base of support Gait velocity: decr   General Gait Details: min assist to steady, R ataxia with circumduction observed. VC for room navigation, L eye occlusion glasses donned during session.  Stairs            Wheelchair Mobility    Modified Rankin (Stroke Patients Only) Modified Rankin (Stroke Patients Only) Pre-Morbid Rankin Score: No significant disability Modified Rankin: Moderately severe disability     Balance Overall balance assessment: Needs assistance Sitting-balance support: No upper extremity supported;Feet supported Sitting balance-Leahy Scale: Fair     Standing balance support: Bilateral upper extremity supported;During functional activity Standing balance-Leahy Scale: Poor Standing balance comment: reliant on external assist                             Pertinent Vitals/Pain Pain Assessment: No/denies pain    Home Living Family/patient expects to be discharged to:: Private residence Living Arrangements: Spouse/significant other Available Help at Discharge: Family;Available 24 hours/day Type of Home: House Home Access: Stairs to enter Entrance Stairs-Rails: Doctor, general practice of Steps: 4 Home Layout: Two level;Bed/bath upstairs Home Equipment: Tub bench;Shower seat;Walker - 2 wheels      Prior Function Level of Independence: Independent         Comments: pt takes care of home, walks 2-3 miles for exercise.  Pt's sister able to be with pt during the day as needed     Hand Dominance   Dominant Hand: Right    Extremity/Trunk Assessment   Upper Extremity Assessment Upper Extremity  Assessment: RUE deficits/detail RUE Coordination: decreased fine motor;decreased gross motor    Lower Extremity Assessment Lower Extremity Assessment: RLE deficits/detail RLE Deficits / Details: grossly WFL, + incoordination during gait RLE Coordination: decreased fine motor;decreased gross motor    Cervical / Trunk Assessment Cervical / Trunk Assessment: Normal  Communication   Communication: No difficulties  Cognition Arousal/Alertness: Awake/alert Behavior During Therapy: WFL for tasks assessed/performed;Impulsive Overall Cognitive Status: Impaired/Different from baseline Area of Impairment: Safety/judgement;Problem solving                         Safety/Judgement: Decreased awareness of safety   Problem Solving: Requires verbal cues;Requires tactile cues;Difficulty sequencing General Comments: Pt can be impulsive, requires cues to sit safely on support surfaces and await PT assist.      General Comments General comments (skin integrity, edema, etc.): L eye occlusion glasses provided to lessen symptomatic diplopia    Exercises     Assessment/Plan    PT Assessment Patient needs continued PT services  PT Problem List Decreased strength;Decreased mobility;Decreased safety awareness;Decreased activity tolerance;Decreased balance;Decreased knowledge of use of DME;Decreased coordination       PT Treatment Interventions DME instruction;Therapeutic activities;Gait training;Therapeutic exercise;Patient/family education;Balance training;Stair training;Functional mobility training;Neuromuscular re-education    PT Goals (Current goals can be found in the Care Plan section)  Acute Rehab PT Goals Patient Stated Goal: return to independence PT Goal Formulation: With patient/family Time For Goal Achievement: 03/10/21 Potential to Achieve Goals: Good    Frequency Min 4X/week   Barriers to discharge        Co-evaluation               AM-PAC PT "6 Clicks"  Mobility  Outcome Measure Help needed turning from your back to your side while in a flat bed without using bedrails?: A Little Help needed moving from lying on your back to sitting on the side of a flat bed without using bedrails?: A Little Help needed moving to and from a bed to a chair (including a wheelchair)?: A Little Help needed standing up from a chair using your arms (e.g., wheelchair or bedside chair)?: A Little Help needed to walk in hospital room?: A Little Help needed climbing 3-5 steps with a railing? : A Lot 6 Click Score: 17    End of Session   Activity Tolerance: Patient tolerated treatment well;Patient limited by fatigue Patient left: in chair;with call bell/phone within reach;Other (comment);with family/visitor present (chair alarm pad placed, no posey alarm box in room. RN looking for one, in the meantime pt and husband state pt will wait for RN assist for back to bed) Nurse Communication: Mobility status PT Visit Diagnosis: Other abnormalities of gait and mobility (R26.89);Ataxic gait (R26.0)    Time: 3785-8850 PT Time Calculation (min) (ACUTE ONLY): 40 min   Charges:   PT Evaluation $PT Eval Low Complexity: 1 Low PT Treatments $Gait Training: 8-22 mins        Marye Round, PT DPT Acute Rehabilitation Services Pager [phone]  Office [phone]   Tyrone Apple E Christain [place] 02/24/2021, 10:43 AM

## 2021-02-24 NOTE — Progress Notes (Signed)
STROKE TEAM PROGRESS NOTE   INTERVAL HISTORY No acute events PT at bedside with patient in chair. CIR will be recommended. We discussed plan of care, diagnositic findings with patient and husband. Brien Mates study offered. We discussed DAPT therapy plan. Questions were addressed.  Neurological exam stable except for some new diplopia on downgaze and mild right finger-to-nose dysmetria Loop recorder interrogated yesterday by [employer] shows no paroxysmal A. fib Vitals:   02/23/21 2055 02/23/21 2325 02/24/21 0328 02/24/21 0757  BP:  118/60 135/69 (!) 154/68  Pulse:  68 67 63  Resp:  17 16 16   Temp: 99.3 F (37.4 C) 98.8 F (37.1 C) 98.2 F (36.8 C) 98.3 F (36.8 C)  TempSrc: Oral Oral Oral Oral  SpO2:  97% 97% 99%  Weight:      Height:       CBC:  Recent Labs  Lab 02/22/21 2257 02/23/21 0045 02/23/21 0901  WBC 11.2*  --  6.9  NEUTROABS 4.2  --   --   HGB 12.3 12.6 12.6  HCT 37.1 37.0 38.5  MCV 87.5  --  86.9  PLT 263  --  238   Basic Metabolic Panel:  Recent Labs  Lab 02/22/21 2257 02/23/21 0045 02/23/21 0745  NA 136 139 134*  K 2.8* 3.9 3.8  CL 106 104 105  CO2 24  --  22  GLUCOSE 120* 139* 123*  BUN 13 14 9   CREATININE 0.53 0.40* 0.54  CALCIUM 8.7*  --  8.4*  MG  --   --  1.9  PHOS  --   --  3.6   Lipid Panel:  Recent Labs  Lab 02/23/21 0745  CHOL 105  TRIG 28  HDL 48  CHOLHDL 2.2  VLDL 6  LDLCALC 51   HgbA1c:  Recent Labs  Lab 02/23/21 0901  HGBA1C 6.0*   Urine Drug Screen:  Recent Labs  Lab 02/23/21 0033  LABOPIA NONE DETECTED  COCAINSCRNUR NONE DETECTED  LABBENZ NONE DETECTED  AMPHETMU NONE DETECTED  THCU NONE DETECTED  LABBARB NONE DETECTED    Alcohol Level  Recent Labs  Lab 02/23/21 0040  ETH <10   IMAGING AND PERTINENT DIAGNOSTICS MR Brain 4-5 cm region of acute infarction within the superior cerebellum on the right. Punctate acute infarction in the right dorsal midbrain. Mild swelling. No evidence of acute hemorrhage. Old  infarction in the left superior cerebellum with atrophy, encephalomalacia and gliosis and hemosiderin deposition.  CT Head Code Stroke 1. Evolving hypodensity involving the superior right cerebellum/cerebellar vermis, concerning for an acute right SCA distribution infarct. No intracranial hemorrhage or mass effect. 2. ASPECTS is 10. 3. Underlying chronic left SCA distribution infarcts. PHYSICAL EXAM Constitutional: Pleasant middle-age Caucasian 04/25/21 lady appears well-developed and well-nourished.  Psych: Affect appropriate to situation Eyes: No scleral injection HENT: No OP obstruction MSK: no joint deformities.  Cardiovascular: Normal rate and regular rhythm.  Respiratory: Effort normal, non-labored breathing GI: Soft.  No distension. There is no tenderness.  Skin: WDI  Neuro: Mental Status: Patient is awake, alert, oriented to person, place, month, year, and situation. Patient is able to give a clear and coherent history. No signs of aphasia or neglect Cranial Nerves: II: Visual Fields are full. Pupils are equal, round, and reactive to light.   III,IV, VI: EOMI without ptosis or diploplia.  Mild saccadic dysmetria on lateral gaze bilaterally but no nystagmus.  No eye movement restriction but subjective vertical diplopia on downgaze V: Facial sensation is symmetric to temperature VII:  Facial movement is symmetric.  VIII: hearing is intact to voice X: Uvula elevates symmetrically XI: Shoulder shrug is symmetric. XII: tongue is midline without atrophy or fasciculations.  Motor: Tone is normal. Bulk is normal. 5/5 strength was present in all four extremities.  Sensory: Sensation is symmetric to light touch and temperature in the arms and legs. Deep Tendon Reflexes: 2+ and symmetric in the biceps and patellae.  Plantars: Toes are downgoing bilaterally.  Cerebellar: FNF and HKS are intact on the left, impaired on the right arm >right leg   ASSESSMENT/PLAN Judy Gutierrez is  a 60 y.o. female with a history of previous stroke affecting the left cerebellum who presents with similar symptoms. She states that she was in her normal state of health 5/9 until  developing sudden onset of vertigo, nausea and vomiting around 9:45 PM for which she called EMS. No tPA was given. Admitted to the floor with  acute right cerebellar stroke with concern for dissection on CTA.   Recurrent right cerebellar cryptogenic stroke.  Past history of left cerebellar cryptogenic stroke in April 2021 with negative extensive work-up including TEE and loop recorder and anticoagulants and   Code Stroke: Evolving hypodensity involving the superior right cerebellum/cerebellar vermis, concerning for an acute right SCA distribution infarct.   CTA head and neck: Focal intimal irregularity involving the distal right V3 segment, Downstream occlusion of the mid-distal right superior cerebellar artery, likely embolic    MRI: right superior cerebellum acute infarction  Loop recorder interrogation: No atrial fib or other arrhythmia which may contribute to stroke   2D Echo completed, result is pending  Hypercoag panel is pending   VTE prophylaxis -     Diet   Diet Heart Room service appropriate? Yes; Fluid consistency: Thin   On ASA 81mg  prior to admission.   Plan for DAPT with Brilinta 90 BID and ASA 81mg  x 4 weeks then Brilinta alone  Therapy recommendations:  CIR  Disposition:  TBD  Hypertension  Stable . Permissive hypertension (OK if < 220/120) but gradually normalize in 5-7 days . Long-term BP goal normotensive  Hyperlipidemia  Home meds:  Lipitor 40mg   LDL 51, at goal < 70  High intensity statin: already at goal on home regimen  Continue statin at discharge  Other Stroke Risk Factors  Hx stroke/TIA: Cerebellar 2019  Other Active Problems    Hospital day # 1 Continue ongoing therapies.  Mobilize out of bed.  Transfer to rehab when bed available.  Continue aspirin and  Brilinta for 4 weeks followed by Brilinta alone if she can afford it.  May also consider possible participation in the study if interested.  Follow hypercoagulable panel labs.  Long discussion the patient and husband at the bedside and answered questions.  Greater than 50% time during this 25-minute visit was spent in counseling and coordination of care about her cryptogenic stroke and answering questions.  Stroke team will sign off.  Follow-up as an outpatient stroke clinic in 6 weeks kindly call for questions.  Discussed with Dr. . 2020, MD To contact Stroke Continuity provider, please refer to [place]. After hours, contact General Neurology

## 2021-02-24 NOTE — Evaluation (Signed)
Occupational Therapy Evaluation Patient Details Name: Judy Gutierrez MRN: [id number] DOB: 04-28-1961 Today's Date: 02/24/2021    History of Present Illness 60 yo female presents to [facility] on 5/9 with vertigo, N/V. CTH shows evolving hypodensity involving superior R cerebellum/cerebellar vermis concerning for acute R SCA CVA. MR brain shows 4-5 cm region of acute infarction within the superior cerebellum on the right. Punctate acute infarction in the right dorsal midbrain No TPA given. PMH includes prior embolic cerebellar CVA 2021, HTN, pelvic fx 2015.   Clinical Impression   Patient admitted for the diagnosis above.  PTA the patient was very active, and independent with all care and mobility.  She lives with her spouse who owns a Science writer in town.  He is generally at work during the day, but her sister is able to be with her during the day, and can provide assist as needed.  Barriers are listed below.  Currently she is needing up to Min A for ADL and mobility.  She remains a high fall risk, and CIR has been recommended.  Given her prior level of function and motivation, she should do well.  OT will continue to see her in the acute setting to maximize her functional status.      Follow Up Recommendations  CIR    Equipment Recommendations  None recommended by OT    Recommendations for Other Services Rehab consult     Precautions / Restrictions Precautions Precautions: Fall Precaution Comments: Diplopia, L eye Restrictions Weight Bearing Restrictions: No      Mobility Bed Mobility Overal bed mobility: Needs Assistance Bed Mobility: Supine to Sit;Sit to Supine     Supine to sit: Min guard Sit to supine: Min guard   General bed mobility comments: for safety, increased time with use of bedrails to perform    Transfers Overall transfer level: Needs assistance Equipment used: 1 person hand held assist Transfers: Sit to/from [employer] Sit to Stand: Min  assist Stand pivot transfers: Min assist       General transfer comment: min assist to rise and steady, pt immediately reaching for environment to self-steady. STS x3, from EOB x2 and toilet x1.    Balance Overall balance assessment: Needs assistance Sitting-balance support: Feet supported Sitting balance-Leahy Scale: Fair     Standing balance support: Single extremity supported Standing balance-Leahy Scale: Poor Standing balance comment: reliant on external assist                           ADL either performed or assessed with clinical judgement   ADL Overall ADL's : Needs assistance/impaired Eating/Feeding: Set up;Sitting   Grooming: Wash/dry hands;Min guard;Standing   Upper Body Bathing: Supervision/ safety;Sitting   Lower Body Bathing: Minimal assistance;Sit to/from stand   Upper Body Dressing : Supervision/safety;Sitting   Lower Body Dressing: Minimal assistance;Sit to/from stand   Toilet Transfer: Minimal assistance;Ambulation;Regular Toilet   Toileting- Clothing Manipulation and Hygiene: Supervision/safety;Sitting/lateral lean       Functional mobility during ADLs: Minimal assistance General ADL Comments: hand held assist     Vision Patient Visual Report: Diplopia Additional Comments: Glasses occluded with PT eval.  OT to partial occlude to minimize double vision.  Vertical diplopia     Perception     Praxis      Pertinent Vitals/Pain Pain Assessment: No/denies pain     Hand Dominance Right   Extremity/Trunk Assessment Upper Extremity Assessment Upper Extremity Assessment: RUE deficits/detail RUE Coordination: decreased fine  motor;decreased gross motor   Lower Extremity Assessment Lower Extremity Assessment: Defer to PT evaluation RLE Deficits / Details: grossly WFL, + incoordination during gait RLE Coordination: decreased fine motor;decreased gross motor   Cervical / Trunk Assessment Cervical / Trunk Assessment: Normal    Communication Communication Communication: No difficulties   Cognition Arousal/Alertness: Awake/alert Behavior During Therapy: Impulsive Overall Cognitive Status: Within Functional Limits for tasks assessed Area of Impairment: Safety/judgement;Problem solving                         Safety/Judgement: Decreased awareness of safety   Problem Solving: Requires verbal cues;Requires tactile cues;Difficulty sequencing General Comments: Pt can be impulsive, requires cues to sit safely on support surfaces and await PT assist.   General Comments  L eye occlusion glasses provided to lessen symptomatic diplopia    Exercises     Shoulder Instructions      Home Living Family/patient expects to be discharged to:: Private residence Living Arrangements: Spouse/significant other Available Help at Discharge: Family;Available 24 hours/day Type of Home: House Home Access: Stairs to enter [employer] of Steps: 4 Entrance Stairs-Rails: Right;Left Home Layout: Two level;Bed/bath upstairs   Alternate Level Stairs-Rails: Left Bathroom Shower/Tub: Producer, television/film/video: Standard Bathroom Accessibility: Yes How Accessible: Accessible via walker Home Equipment: Tub bench;Shower seat;Walker - 2 wheels          Prior Functioning/Environment Level of Independence: Independent        Comments: Independent with meds, ADL, IADL.  Spouse owns a Science writer in town        OT Problem List: Decreased strength;Decreased activity tolerance;Impaired balance (sitting and/or standing);Impaired vision/perception;Decreased safety awareness;Impaired UE functional use      OT Treatment/Interventions: Self-care/ADL training;Therapeutic exercise;Neuromuscular education;DME and/or AE instruction;Balance training;Therapeutic activities    OT Goals(Current goals can be found in the care plan section) Acute Rehab OT Goals Patient Stated Goal: return home OT Goal  Formulation: With patient Time For Goal Achievement: 03/10/21 Potential to Achieve Goals: Good ADL Goals Pt Will Perform Grooming: with set-up;standing Pt Will Perform Lower Body Bathing: with set-up;sit to/from stand Pt Will Perform Lower Body Dressing: with set-up;sit to/from stand Pt Will Transfer to Toilet: with supervision;ambulating;regular height toilet  OT Frequency: Min 2X/week   Barriers to D/C:    none noted       Co-evaluation              AM-PAC OT "6 Clicks" Daily Activity     Outcome Measure Help from another person eating meals?: A Little Help from another person taking care of personal grooming?: A Little Help from another person toileting, which includes using toliet, bedpan, or urinal?: A Little Help from another person bathing (including washing, rinsing, drying)?: A Little Help from another person to put on and taking off regular upper body clothing?: None Help from another person to put on and taking off regular lower body clothing?: A Little 6 Click Score: 19   End of Session Equipment Utilized During Treatment: Gait belt Nurse Communication: Mobility status  Activity Tolerance: Patient tolerated treatment well Patient left: in bed;with call bell/phone within reach;with family/visitor present  OT Visit Diagnosis: Unsteadiness on feet (R26.81);Ataxia, unspecified (R27.0)                Time: 6712-4580 OT Time Calculation (min): 19 min Charges:  OT General Charges $OT Visit: 1 Visit OT Evaluation $OT Eval Moderate Complexity: 1 Mod  02/24/2021  Rich, OTR/L  Acute Rehabilitation Services  Office:  [phone]  Suzanna Obey 02/24/2021, 1:33 PM

## 2021-02-24 NOTE — Consult Note (Signed)
Physical Medicine and Rehabilitation Consult Reason for Consult: Vertigo with right side incoordination and ataxic gait Referring Physician: Dr. Rito Ehrlich   HPI: Judy Gutierrez is a 60 y.o. right-handed female with history of prior CVA maintained on aspirin, hypertension, hyperlipidemia. History taken from chart review and patient.  Patient lives with spouse.  Independent prior to admission.  Two-level home bed and bath upstairs.  She has a sister with good support.  She presented on 02/22/2021 with vertigo, acute onset as well as nausea and vomiting.  CT of the head showed evolving hypodensity involving the superior right cerebellum/cerebellar vermis, concerning for acute right SCA distribution infarction.  No intracranial hemorrhage or mass-effect.  CT angiogram of the head showed   Focal intimal irregularity involving the distal right V3 segment likely reflecting a short segment dissection.  Downstream occlusion of the mid distal right superior cerebellar artery likely embolic.  MRI of the brain showed a 4-5 cm region of acute infarct within the superior cerebellum on the right.  Punctate acute infarct in the right dorsal midbrain.  Mild swelling.  Old infarction in the left superior cerebellum with atrophy, encephalomalacia.  Patient did not receive tPA.  Echocardiogram pending. Admission chemistries urine drug screen negative, alcohol negative, potassium 2.8, glucose 120.  Patient is currently maintained on low-dose aspirin as well as Brilinta for CVA prophylaxis.  Subcutaneous Lovenox for DVT prophylaxis.  Tolerating a regular consistency diet.  Therapy evaluations completed due to patient's vertigo and decreased functional mobility recommendations of physical medicine rehab consult.  Review of Systems  Constitutional: Negative for chills and fever.  HENT: Negative for hearing loss.   Eyes: Positive for double vision. Negative for blurred vision.  Respiratory: Negative for cough and shortness of  breath.   Cardiovascular: Negative for chest pain, palpitations and leg swelling.  Gastrointestinal: Negative for nausea and vomiting.  Genitourinary: Negative for dysuria, flank pain and hematuria.  Skin: Negative for rash.  Neurological: Positive for focal weakness and headaches.       Vertigo  All other systems reviewed and are negative.  Past Medical History:  Diagnosis Date  . Disturbance of skin sensation   . Myalgia and myositis, unspecified   . Other B-complex deficiencies   . 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 Stroke (HCC)   . Unspecified essential hypertension    Past Surgical History:  Procedure Laterality Date  . BUBBLE STUDY  03/05/2020   Procedure: BUBBLE STUDY;  Surgeon: [name], MD;  Location: [facility] ENDOSCOPY;  Service: Cardiovascular;;  . CESAREAN SECTION  1992  . LOOP RECORDER INSERTION N/A 03/05/2020   Procedure: LOOP RECORDER INSERTION;  Surgeon: [name], MD;  Location: [facility] INVASIVE CV LAB;  Service: Cardiovascular;  Laterality: N/A;  . TEE WITHOUT CARDIOVERSION N/A 03/05/2020   Procedure: TRANSESOPHAGEAL ECHOCARDIOGRAM (TEE);  Surgeon: [name], MD;  Location: [facility] ENDOSCOPY;  Service: Cardiovascular;  Laterality: N/A;   Family History  Problem Relation Age of Onset  . Pulmonary embolism Mother   . Hepatitis Father   . Diabetes Father   . Diabetes Mellitus II Sister   . Breast cancer Neg Hx    Social History:  reports that she has never smoked. She has never used smokeless tobacco. She reports that she does not drink alcohol and does not use drugs. Allergies: No Known Allergies Medications Prior to Admission  Medication Sig Dispense Refill  . amLODipine (NORVASC) 5 MG tablet Take 1 tablet (5 mg total)  by mouth daily. 90 tablet 3  . aspirin 81 MG EC tablet Take 1 tablet (81 mg total) by mouth daily. 90 tablet 1  . atorvastatin (LIPITOR) 40 MG tablet Take 1 tablet (40 mg total) by mouth daily. 90 tablet 3  .  Calcium-Magnesium-Vitamin D (CALCIUM MAGNESIUM PO) Take by mouth daily.    Marland Kitchen. losartan (COZAAR) 50 MG tablet Take 1 tablet (50 mg total) by mouth daily. 90 tablet 3  . Multiple Minerals-Vitamins (MULTISOURCE CALCIUM MAG/D) TABS Take 1 tablet by mouth daily.    . Multiple Vitamin (MULTIVITAMIN WITH MINERALS) TABS tablet Take 1 tablet by mouth daily.      Home: Home Living Family/patient expects to be discharged to:: Private residence Living Arrangements: Spouse/significant other Available Help at Discharge: Family,Available 24 hours/day Type of Home: House Home Access: Stairs to enter Entergy CorporationEntrance Stairs-Number of Steps: 4 Entrance Stairs-Rails: Right,Left Home Layout: Two level,Bed/bath upstairs Alternate Level Stairs-Rails: Left Bathroom Shower/Tub: Health visitorWalk-in shower Bathroom Toilet: Standard Home Equipment: Tub bench,Shower seat,Walker - 2 wheels  Functional History: Prior Function Level of Independence: Independent Comments: pt takes care of home, walks 2-3 miles for exercise. Pt's sister able to be with pt during the day as needed Functional Status:  Mobility: Bed Mobility Overal bed mobility: Needs Assistance Bed Mobility: Supine to Sit Supine to sit: Min guard General bed mobility comments: for safety, increased time with use of bedrails to perform Transfers Overall transfer level: Needs assistance Equipment used: 1 person hand held assist Transfers: Sit to/from Stand Sit to Stand: Min assist General transfer comment: min assist to rise and steady, pt immediately reaching for environment to self-steady. STS x3, from EOB x2 and toilet x1. Ambulation/Gait Ambulation/Gait assistance: Min assist Gait Distance (Feet): 60 Feet Assistive device: 1 person hand held assist,IV Pole Gait Pattern/deviations: Step-through pattern,Decreased stride length,Ataxic,Narrow base of support General Gait Details: min assist to steady, R ataxia with circumduction observed. VC for room navigation, L  eye occlusion glasses donned during session. Gait velocity: decr    ADL:    Cognition: Cognition Overall Cognitive Status: Impaired/Different from baseline Orientation Level: Oriented X4 Cognition Arousal/Alertness: Awake/alert Behavior During Therapy: WFL for tasks assessed/performed,Impulsive Overall Cognitive Status: Impaired/Different from baseline Area of Impairment: Safety/judgement,Problem solving Safety/Judgement: Decreased awareness of safety Problem Solving: Requires verbal cues,Requires tactile cues,Difficulty sequencing General Comments: Pt can be impulsive, requires cues to sit safely on support surfaces and await PT assist.  Blood pressure (!) 154/68, pulse 63, temperature 98.3 F (36.8 C), temperature source Oral, resp. rate 16, height 5\' 2"  (1.575 m), weight 62.1 kg, last menstrual period 12/08/2010, SpO2 99 %. Physical Exam Vitals reviewed.  Constitutional:      General: She is not in acute distress.    Appearance: She is normal weight. She is not ill-appearing.  HENT:     Head: Normocephalic and atraumatic.     Right Ear: External ear normal.     Left Ear: External ear normal.     Nose: Nose normal.  Eyes:     General:        Right eye: No discharge.        Left eye: No discharge.     Extraocular Movements: Extraocular movements intact.  Cardiovascular:     Rate and Rhythm: Normal rate and regular rhythm.  Pulmonary:     Effort: Pulmonary effort is normal.     Breath sounds: Normal breath sounds.  Abdominal:     General: Abdomen is flat. Bowel sounds are normal. There is  no distension.  Musculoskeletal:     Cervical back: Normal range of motion and neck supple.     Comments: No edema or tenderness in extremities  Skin:    General: Skin is warm and dry.  Neurological:     Mental Status: She is alert and oriented to person, place, and time.     Comments: Patient is alert in no acute distress.   Makes eye contact with examiner.   Follows simple  demonstrated commands.   Provides name and age. Motor: LUE/LLE: 5/5 proximal to distal RUE/RLE: 4+/5 proximal to distal  Psychiatric:        Mood and Affect: Mood normal.        Behavior: Behavior normal.        Thought Content: Thought content normal.     Results for orders placed or performed during the hospital encounter of 02/22/21 (from the past 24 hour(s))  Antithrombin III     Status: None   Collection Time: 02/23/21  4:13 PM  Result Value Ref Range   AntiThromb III Func 105 75 - 120 %   MR BRAIN WO CONTRAST  Result Date: 02/23/2021 CLINICAL DATA:  Dizziness, nausea and vomiting. EXAM: MRI HEAD WITHOUT CONTRAST TECHNIQUE: Multiplanar, multiecho pulse sequences of the brain and surrounding structures were obtained without intravenous contrast. COMPARISON:  CT studies same day FINDINGS: Brain: Diffusion imaging shows a 4-5 cm region of acute infarction within the superior cerebellum on the right. Acute infarction also within the dorsal right midbrain. Old infarction within the left superior cerebellum. Mild swelling but no acute hemorrhage in the right cerebellum. Old hemosiderin residual within the left cerebellar stroke. Cerebral hemispheres are normal. No mass, hydrocephalus or extra-axial collection. Vascular: No acute vascular finding by standard MRI. Skull and upper cervical spine: Negative Sinuses/Orbits: Mucosal inflammatory changes of the left maxillary and sphenoid sinuses. Orbits negative. Other: None IMPRESSION: 4-5 cm region of acute infarction within the superior cerebellum on the right. Punctate acute infarction in the right dorsal midbrain. Mild swelling. No evidence of acute hemorrhage. Old infarction in the left superior cerebellum with atrophy, encephalomalacia and gliosis and hemosiderin deposition. Electronically Signed   By: Paulina Fusi M.D.   On: 02/23/2021 08:42   CT HEAD CODE STROKE WO CONTRAST  Result Date: 02/23/2021 CLINICAL DATA:  Code stroke. Initial  evaluation for neuro deficit, stroke suspected. EXAM: CT HEAD WITHOUT CONTRAST TECHNIQUE: Contiguous axial images were obtained from the base of the skull through the vertex without intravenous contrast. COMPARISON:  Prior CT from 01/31/2020. FINDINGS: Brain: Streak/beam hardening artifact emanating from the left occipital calvarium noted, mildly limiting assessment. Cerebral volume within normal limits. Focal encephalomalacia at the central and left superior cerebellum, consistent with a chronic left MCA distribution infarct. Subtle evolving hypodensity noted involving the superior right cerebellum and cerebellar vermis, concerning for an acute right SCA distribution infarct (series 3, image 12, 11). No associated hemorrhage or mass effect. No other evidence for acute large vessel territory infarct. No intracranial hemorrhage. No mass lesion, mass effect or midline shift. No hydrocephalus or extra-axial fluid collection. Vascular: No hyperdense vessel. Skull: Scalp soft tissues demonstrate no acute finding. Small focal exostosis noted at the right frontal calvarium. Calvarium intact. Sinuses/Orbits: Globes and orbital soft tissues within normal limits. Moderate left sphenoid and maxillary sinusitis. Additional scattered pneumatized secretions noted within the ethmoidal air cells. Subcentimeter osteoma noted within the right ethmoidal air cells as well. Mastoid air cells are clear. Other: None. ASPECTS [facility] Stroke Program  Early CT Score) - Ganglionic level infarction (caudate, lentiform nuclei, internal capsule, insula, M1-M3 cortex): 7 - Supraganglionic infarction (M4-M6 cortex): 3 Total score (0-10 with 10 being normal): 10 IMPRESSION: 1. Evolving hypodensity involving the superior right cerebellum/cerebellar vermis, concerning for an acute right SCA distribution infarct. No intracranial hemorrhage or mass effect. 2. ASPECTS is 10. 3. Underlying chronic left SCA distribution infarcts. Critical Value/emergent  results were called by telephone at the time of interpretation on 02/23/2021 at 12:55 a.m. to provider Dr. Amada Jupiter, Who verbally acknowledged these results. Electronically Signed   By: Rise Mu M.D.   On: 02/23/2021 01:23   CT ANGIO HEAD NECK W WO CM (CODE STROKE)  Result Date: 02/23/2021 CLINICAL DATA:  Initial evaluation for neuro deficit, stroke suspected. EXAM: CT ANGIOGRAPHY HEAD AND NECK TECHNIQUE: Multidetector CT imaging of the head and neck was performed using the standard protocol during bolus administration of intravenous contrast. Multiplanar CT image reconstructions and MIPs were obtained to evaluate the vascular anatomy. Carotid stenosis measurements (when applicable) are obtained utilizing NASCET criteria, using the distal internal carotid diameter as the denominator. CONTRAST:  78mL OMNIPAQUE IOHEXOL 350 MG/ML SOLN COMPARISON:  Prior head CT from earlier the same day. FINDINGS: CTA NECK FINDINGS Aortic arch: Visualized aortic arch normal in caliber with normal branch pattern. No stenosis about the origin of the great vessels. Right carotid system: Right common carotid artery patent from its origin to the bifurcation without stenosis. Minimal plaque about the right carotid bulb without stenosis. Right ICA patent distally without stenosis, dissection or occlusion. Left carotid system: Left CCA patent from its origin to the bifurcation without stenosis. Minimal plaque about the left carotid bulb without significant stenosis. Left ICA patent distally without stenosis, dissection or occlusion. Vertebral arteries: Left vertebral artery arises directly from the aortic arch. Right vertebral artery dominant. Vertebral arteries widely patent within the neck. Just prior to the skull base/dural reflection, there is distal right V3 segment (series 9, image 83), suspicious for a possible short-segment dissection. Small amount of probable intraluminal thrombus protrudes into the vascular lumen at  this level. No frank raised dissection flap is seen. No more than mild stenosis at this level. The right vertebral artery is relatively normal just distally as it crosses into the cranial vault. Skeleton: No acute osseous finding. No discrete or worrisome osseous lesions. Mild cervical spondylosis noted at C5-6 without significant stenosis. Other neck: No other acute soft tissue abnormality within the neck. No mass or adenopathy. Upper chest: Visualized upper chest demonstrates no acute finding. Review of the MIP images confirms the above findings CTA HEAD FINDINGS Anterior circulation: Petrous, cavernous, and supraclinoid segments patent without stenosis or other abnormality. A1 segments widely patent. Normal anterior communicating artery complex. Anterior cerebral arteries patent to their distal aspects without stenosis. No M1 stenosis or occlusion. Normal MCA bifurcations. Distal MCA branches well perfused and symmetric. Posterior circulation: Both V4 segments patent to the vertebrobasilar junction without stenosis. Both PICA origins patent and normal. Basilar somewhat diminutive but patent to its distal aspect without stenosis. Left superior cerebral artery patent. There is occlusion of the mid-distal right superior cerebellar artery, likely embolic (series 8, image 105). Left PCA supplied via the basilar as well as a prominent left posterior communicating artery. Predominant fetal type origin of the right PCA. Both PCAs remain widely patent to their distal aspects. Venous sinuses: Grossly patent allowing for timing the contrast bolus. Anatomic variants: Predominant fetal type origin of the right PCA. Review of the MIP  images confirms the above findings IMPRESSION: 1. Focal intimal irregularity involving the distal right V3 segment, likely reflecting a short-segment dissection. No frank raised dissection flap or significant luminal narrowing. 2. Downstream occlusion of the mid-distal right superior cerebellar  artery, likely embolic. 3. Mild for age atheromatous disease elsewhere about the major arterial vasculature of the head and neck. No other hemodynamically significant or correctable stenosis. These results were communicated to Dr. Amada Jupiter at 1:22 amon 5/10/2022by text page via the [facility] messaging system. Electronically Signed   By: Rise Mu M.D.   On: 02/23/2021 01:38    Assessment/Plan: Diagnosis: Superior cerebellum infarct with punctate acute infarct in the right dorsal midbrain.   Stroke: Continue secondary stroke prophylaxis and Risk Factor Modification listed below:   Antiplatelet therapy:   Blood Pressure Management:  Continue current medication with prn's with permisive HTN per primary team2 Statin Agent:   Prediabetes management:   Right sided hemiparesis:  PT/OT for mobility, ADL training  Motor recovery: Fluoxetine Labs independently reviewed.  Records reviewed and summated above.  1. Does the need for close, 24 hr/day medical supervision in concert with the patient's rehab needs make it unreasonable for this patient to be served in a less intensive setting? Yes  2. Co-Morbidities requiring supervision/potential complications: hx of CVA, HTN (monitor and provide prns in accordance with increased physical exertion and pain), hyperlipidemia, prediabetes (Monitor in accordance with exercise and adjust meds as necessary), hyponatremia (repeat labs, consider intervention if necessary) 3. Due to safety, disease management and patient education, does the patient require 24 hr/day rehab nursing? Yes 4. Does the patient require coordinated care of a physician, rehab nurse, therapy disciplines of PT/OT to address physical and functional deficits in the context of the above medical diagnosis(es)? Yes Addressing deficits in the following areas: balance, endurance, locomotion, strength, transferring, bathing, dressing, toileting and psychosocial support 5. Can the patient actively  participate in an intensive therapy program of at least 3 hrs of therapy per day at least 5 days per week? Yes 6. The potential for patient to make measurable gains while on inpatient rehab is excellent 7. Anticipated functional outcomes upon discharge from inpatient rehab are modified independent and supervision  with PT, modified independent and supervision with OT, n/a with SLP. 8. Estimated rehab length of stay to reach the above functional goals is: 9-13 days. 9. Anticipated discharge destination: Home 10. Overall Rehab/Functional Prognosis: good  RECOMMENDATIONS: This patient's condition is appropriate for continued rehabilitative care in the following setting: CIR Patient has agreed to participate in recommended program. Yes Note that insurance prior authorization may be required for reimbursement for recommended care.  Comment: Rehab Admissions Coordinator to follow up.  I have personally performed a face to face diagnostic evaluation, including, but not limited to relevant history and physical exam findings, of this patient and developed relevant assessment and plan.  Additionally, I have reviewed and concur with the physician assistant's documentation above.   Maryla Morrow, MD, ABPMR Mcarthur Rossetti Angiulli, PA-C 02/24/2021

## 2021-02-24 NOTE — Progress Notes (Signed)
SLP Cancellation Note  Patient Details Name: Judy Gutierrez MRN: [id number] DOB: 27-Apr-1961   Cancelled treatment:       Reason Eval/Treat Not Completed: SLP screened, no needs identified, will sign off.  Patient passed [facility] swallow screen and currently on a regular diet, thin liquids.   Veora Fonte MA, CCC-SLP     Lahari Suttles Meryl 02/24/2021, 9:30 AM

## 2021-02-24 NOTE — Progress Notes (Signed)
Inpatient Rehab Admissions Coordinator Note:   Per PT recommendations, pt was screened for CIR candidacy by Estill Dooms, PT, DPT.  At this time we are recommending a CIR consult and I will place an order per our protocol.  Please contact me with questions.   Estill Dooms, PT, DPT [phone] 02/24/21 10:53 AM

## 2021-02-24 NOTE — H&P (Signed)
Physical Medicine and Rehabilitation Admission H&P    Chief Complaint  Patient presents with  . Dizziness  . Nausea  . Emesis  . 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  History taken from chart review and patient. Patient lives with spouse independent prior to admission.  Two-level home bed and bath upstairs.  She has a sister with good support.  She presented on 02/22/2021 with vertigo, acute onset as well as nausea and vomiting.  CT of the head showed evolving hypodensity involving the superior right cerebellum/cerebellar vermis, concerning for acute right SCA distribution infarction.  No intracranial hemorrhage or mass-effect.  CT angiogram of the head showed focal intimal irregularity involving the distal right V3 segment likely reflecting a short segment dissection.  Downstream occlusion of the mid distal right superior cerebellar artery likely embolic.  MRI of the brain showed a 4-5 cm region of acute infarction within the superior cerebellum on the right. Punctate acute infarct in the right dorsal midbrain with mild swelling.  Old infarction in the left superior cerebellum with atrophy/encephalomalacia.  Patient did not receive tPA.  Echocardiogram with EF of 60-65%, grade 1 diastolic dysfunction.  Admission chemistries unremarkable except potassium 2.8, glucose 120, urine drug screen negative alcohol negative patient currently maintained on low-dose aspirin as well as Brilinta for CVA prophylaxis x 4 weeks then Brilinta alone.  Subcutaneous Lovenox for DVT prophylaxis.  Tolerating a regular consistency diet.  Therapy evaluations completed due to patient's vertigo and decreased functional mobility was admitted for a comprehensive rehab program. Please see preadmission assessment from earlier today.   Review of Systems  Constitutional: Negative for  chills and fever.  HENT: Negative for hearing loss.   Eyes: Positive for double vision.  Respiratory: Negative for cough and shortness of breath.   Cardiovascular: Negative for palpitations and leg swelling.  Gastrointestinal: Positive for nausea and vomiting. Negative for heartburn.  Genitourinary: Negative for dysuria, flank pain and hematuria.  Skin: Negative for rash.  Neurological: Positive for dizziness and weakness.       Vertigo  All other systems reviewed and are negative.  Past Medical History:  Diagnosis Date  . Disturbance of skin sensation   . Myalgia and myositis, unspecified   . Other B-complex deficiencies   . 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 Stroke (HCC)   . Unspecified essential hypertension    Past Surgical History:  Procedure Laterality Date  . BUBBLE STUDY  03/05/2020   Procedure: BUBBLE STUDY;  Surgeon: [name], MD;  Location: [facility] ENDOSCOPY;  Service: Cardiovascular;;  . CESAREAN SECTION  1992  . LOOP RECORDER INSERTION N/A 03/05/2020   Procedure: LOOP RECORDER INSERTION;  Surgeon: [name], MD;  Location: [facility] INVASIVE CV LAB;  Service: Cardiovascular;  Laterality: N/A;  . TEE WITHOUT CARDIOVERSION N/A 03/05/2020   Procedure: TRANSESOPHAGEAL ECHOCARDIOGRAM (TEE);  Surgeon: [name], MD;  Location: [facility] ENDOSCOPY;  Service: Cardiovascular;  Laterality: N/A;   Family History  Problem Relation Age of Onset  . Pulmonary embolism Mother   . Hepatitis Father   . Diabetes Father   . Diabetes Mellitus II Sister   . Breast cancer Neg Hx    Social History:  reports that she has never smoked. She has never used smokeless tobacco. She reports that she does not drink alcohol and does not use drugs. Allergies: No  Known Allergies Medications Prior to Admission  Medication Sig Dispense Refill  . amLODipine (NORVASC) 5 MG tablet Take 1 tablet (5 mg total) by mouth daily. 90 tablet 3  . aspirin 81 MG EC tablet Take 1  tablet (81 mg total) by mouth daily. 90 tablet 1  . atorvastatin (LIPITOR) 40 MG tablet Take 1 tablet (40 mg total) by mouth daily. 90 tablet 3  . Calcium-Magnesium-Vitamin D (CALCIUM MAGNESIUM PO) Take by mouth daily.    Marland Kitchen losartan (COZAAR) 50 MG tablet Take 1 tablet (50 mg total) by mouth daily. 90 tablet 3  . Multiple Minerals-Vitamins (MULTISOURCE CALCIUM MAG/D) TABS Take 1 tablet by mouth daily.    . Multiple Vitamin (MULTIVITAMIN WITH MINERALS) TABS tablet Take 1 tablet by mouth daily.      Drug Regimen Review Drug regimen was reviewed and remains appropriate with no significant issues identified  Home: Home Living Family/patient expects to be discharged to:: Private residence Living Arrangements: Spouse/significant other Available Help at Discharge: Family,Available 24 hours/day Type of Home: House Home Access: Stairs to enter [employer] of Steps: 4 Entrance Stairs-Rails: Right,Left Home Layout: Two level,Bed/bath upstairs Alternate Level Stairs-Rails: Left Bathroom Shower/Tub: Health visitor: Standard Bathroom Accessibility: Yes Home Equipment: Tub bench,Shower seat,Walker - 2 wheels   Functional History: Prior Function Level of Independence: Independent Comments: Independent with meds, ADL, IADL.  Spouse owns a [employer] in town  Functional Status:  Mobility: Bed Mobility Overal bed mobility: Modified Independent Bed Mobility: Sit to Supine Supine to sit: Modified independent (Device/Increase time) Sit to supine: Modified independent (Device/Increase time) General bed mobility comments: increased time and effort, no physical assist needed Transfers Overall transfer level: Needs assistance Equipment used: Rolling walker (2 wheeled) Transfers: Sit to/from Stand Sit to Stand: Min assist,Min guard Stand pivot transfers: Min assist General transfer comment: minA to rise and steady with HHAx1. Truncal ataxia noted upon standing.  Cues for slow controlled movement, min guard to rise from toilet with cues to use grab bar Ambulation/Gait Ambulation/Gait assistance: Min assist Gait Distance (Feet): 120 Feet Assistive device: Rolling walker (2 wheeled) Gait Pattern/deviations: Step-through pattern,Narrow base of support,Decreased stride length,Ataxic General Gait Details: Patient requires min assist with ambulation. She is limited mostly by trunkal instability. Tends to lift RW from side to side during ambulation. With increased distance, patient has increased fatigue and decreased safety. Gait velocity: WNL, starts out with quick pace and requires cues to slow down for safety    ADL: ADL Overall ADL's : Needs assistance/impaired Eating/Feeding: Set up,Sitting Grooming: Wash/dry face,Brushing hair,Sitting,Standing,Min guard,Supervision/safety,Set up Grooming Details (indicate cue type and reason): set- up to brush her hair from EOB, min guard assist for standing grooming at sink Upper Body Bathing: Supervision/ safety,Sitting Lower Body Bathing: Minimal assistance,Sit to/from stand Upper Body Dressing : Supervision/safety,Sitting Lower Body Dressing: Supervision/safety,Sitting/lateral leans Lower Body Dressing Details (indicate cue type and reason): to slide on shoes from EOB Toilet Transfer: RW,Ambulation,Cueing for safety,Regular Toilet,Minimal assistance Toilet Transfer Details (indicate cue type and reason): MINA for functional mobility from EOB > toilet, MINA to manage RW as pt present with ataxic gait, cues for safety to reach back to toilet and use grab bars as needed Toileting- Clothing Manipulation and Hygiene: Supervision/safety,Sitting/lateral lean Functional mobility during ADLs: Minimal assistance,Cueing for safety,Rolling walker General ADL Comments: pt continues to present with ataxic gait needing MIN A to stabilize RW during functiona lmobility, pt reports improved vision and noted to be able to complete  standing ADLS with min  guard assist  Cognition: Cognition Overall Cognitive Status: Within Functional Limits for tasks assessed Orientation Level: Oriented X4 Cognition Arousal/Alertness: Awake/alert Behavior During Therapy: WFL for tasks assessed/performed Overall Cognitive Status: Within Functional Limits for tasks assessed Area of Impairment: Safety/judgement,Problem solving Safety/Judgement: Decreased awareness of safety Problem Solving: Requires verbal cues,Difficulty sequencing General Comments: Requires cues to push up from and reach back for seated surfaces, but she does not  Physical Exam: Blood pressure (!) 146/80, pulse 75, temperature 98.3 F (36.8 C), temperature source Oral, resp. rate 18, height 5\' 2"  (1.575 m), weight 62.1 kg, last menstrual period 12/08/2010, SpO2 99 %. Physical Exam Constitutional:      General: She is not in acute distress.    Appearance: She is normal weight. She is not ill-appearing.  HENT:     Head: Normocephalic and atraumatic.     Right Ear: External ear normal.     Left Ear: External ear normal.     Nose: Nose normal.     Mouth/Throat:     Mouth: Mucous membranes are dry.     Pharynx: Oropharynx is clear.  Eyes:     Extraocular Movements: Extraocular movements intact.  Cardiovascular:     Rate and Rhythm: Normal rate and regular rhythm.  Pulmonary:     Effort: Pulmonary effort is normal. No respiratory distress.     Breath sounds: No stridor.  Abdominal:     General: Abdomen is flat. Bowel sounds are normal. There is no distension.  Musculoskeletal:     Cervical back: Normal range of motion and neck supple.     Comments: No edema or tenderness in extremities  Skin:    General: Skin is warm and dry.  Neurological:     Mental Status: She is alert and oriented to person, place, and time.     Comments: Patient is alert no acute distress.   Makes eye contact with examiner.   Provides name and age.   Follows demonstrated  commands. Motor: LUE/LLE: 5/5 proximal to distal RUE/RLE: 4+/5 proximal to distal   Psychiatric:        Mood and Affect: Mood normal.        Behavior: Behavior normal.        Thought Content: Thought content normal.     Results for orders placed or performed during the hospital encounter of 02/22/21 (from the past 48 hour(s))  CBC     Status: Abnormal   Collection Time: 02/25/21  4:09 AM  Result Value Ref Range   WBC 6.4 4.0 - 10.5 K/uL   RBC 3.91 3.87 - 5.11 MIL/uL   Hemoglobin 11.3 (L) 12.0 - 15.0 g/dL   HCT 04/27/21 (L) 30.0 - 76.2 %   MCV 86.2 80.0 - 100.0 fL   MCH 28.9 26.0 - 34.0 pg   MCHC 33.5 30.0 - 36.0 g/dL   RDW 26.3 33.5 - 45.6 %   Platelets 205 150 - 400 K/uL   nRBC 0.0 0.0 - 0.2 %    Comment: Performed at [facility], [address], [place], [place]  Basic metabolic panel     Status: Abnormal   Collection Time: 02/25/21  4:09 AM  Result Value Ref Range   Sodium 137 135 - 145 mmol/L   Potassium 3.1 (L) 3.5 - 5.1 mmol/L   Chloride 106 98 - 111 mmol/L   CO2 26 22 - 32 mmol/L   Glucose, Bld 118 (H) 70 - 99 mg/dL    Comment: Glucose  reference range applies only to samples taken after fasting for at least 8 hours.   BUN 12 6 - 20 mg/dL   Creatinine, Ser 4.27 0.44 - 1.00 mg/dL   Calcium 8.6 (L) 8.9 - 10.3 mg/dL   GFR, Estimated >06 >23 mL/min    Comment: (NOTE) Calculated using the CKD-EPI Creatinine Equation (2021)    Anion gap 5 5 - 15    Comment: Performed at [facility], [address] [place], [place] [address]  Magnesium     Status: None   Collection Time: 02/25/21  4:09 AM  Result Value Ref Range   Magnesium 2.0 1.7 - 2.4 mg/dL    Comment: Performed at [facility], [address] [place], [place] [address]  Urinalysis, Routine w reflex microscopic Urine, Clean Catch     Status: Abnormal   Collection Time: 02/25/21  1:37 PM  Result Value Ref Range   Color, Urine YELLOW YELLOW   APPearance HAZY (A) CLEAR   Specific  Gravity, Urine 1.012 1.005 - 1.030   pH 7.0 5.0 - 8.0   Glucose, UA NEGATIVE NEGATIVE mg/dL   Hgb urine dipstick LARGE (A) NEGATIVE   Bilirubin Urine NEGATIVE NEGATIVE   Ketones, ur NEGATIVE NEGATIVE mg/dL   Protein, ur NEGATIVE NEGATIVE mg/dL   Nitrite NEGATIVE NEGATIVE   Leukocytes,Ua NEGATIVE NEGATIVE   RBC / HPF >50 (H) 0 - 5 RBC/hpf   WBC, UA 0-5 0 - 5 WBC/hpf   Bacteria, UA RARE (A) NONE SEEN   Squamous Epithelial / LPF 0-5 0 - 5   Mucus PRESENT     Comment: Performed at [facility], [address] [place], [place] [address]  Basic metabolic panel     Status: Abnormal   Collection Time: 02/26/21  1:32 AM  Result Value Ref Range   Sodium 137 135 - 145 mmol/L   Potassium 4.3 3.5 - 5.1 mmol/L   Chloride 105 98 - 111 mmol/L   CO2 27 22 - 32 mmol/L   Glucose, Bld 118 (H) 70 - 99 mg/dL    Comment: Glucose reference range applies only to samples taken after fasting for at least 8 hours.   BUN 15 6 - 20 mg/dL   Creatinine, Ser 7.10 0.44 - 1.00 mg/dL   Calcium 9.0 8.9 - 62.6 mg/dL   GFR, Estimated >94 >85 mL/min    Comment: (NOTE) Calculated using the CKD-EPI Creatinine Equation (2021)    Anion gap 5 5 - 15    Comment: Performed at [facility], [address], [place], [place] [address]   No results found.     Medical Problem List and Plan: 1.  Vertigo nausea vomiting secondary to superior cerebellum infarct with punctate acute infarct in the right dorsal midbrain as well as history of CVA 2019/loop recorder  -patient may shower  -ELOS/Goals: Supervision/Mod I/8-13 days.  Admit to CIR 2.  Antithrombotics: -DVT/anticoagulation: Lovenox  -antiplatelet therapy: Aspirin 81 mg daily and Brilinta 90 mg twice daily x4 weeks then Brilinta alone 3. Pain Management: Tylenol as needed  Monitor, particularly for headaches with increased exertion 4. Mood: Provide emotional support  -antipsychotic agents: N/A 5. Neuropsych: This patient is capable of making decisions  on her own behalf. 6. Skin/Wound Care: Routine skin checks 7. Fluids/Electrolytes/Nutrition: Routine in and outs  CMP ordered 8.  Permissive hypertension.  Patient on Cozaar50 mg daily, Norvasc 5 mg daily prior to admission.    Resume as necessary  9. Hyperlipidemia: Lipitor 10. ABLA  CBC ordered  Charlton AmorDaniel J Angiulli, PA-C 02/26/2021  I have personally performed a face to face diagnostic evaluation, including, but not limited to relevant history and physical exam findings, of this patient and developed relevant assessment and plan.  Additionally, I have reviewed and concur with the physician assistant's documentation above.  Maryla MorrowAnkit Danica Camarena, MD, ABPMR

## 2021-02-24 NOTE — Plan of Care (Signed)
  Problem: Education: Goal: Knowledge of disease or condition will improve Outcome: Progressing Goal: Knowledge of secondary prevention will improve Outcome: Progressing Goal: Knowledge of patient specific risk factors addressed and post discharge goals established will improve Outcome: Progressing Goal: Individualized Educational Video(s) Outcome: Progressing   Problem: Coping: Goal: Will verbalize positive feelings about self Outcome: Progressing Goal: Will identify appropriate support needs Outcome: Progressing   Problem: Self-Care: Goal: Ability to participate in self-care as condition permits will improve Outcome: Progressing Goal: Verbalization of feelings and concerns over difficulty with self-care will improve Outcome: Progressing Goal: Ability to communicate needs accurately will improve Outcome: Progressing   Problem: Health Behavior/Discharge Planning: Goal: Ability to manage health-related needs will improve Outcome: Progressing   Problem: Nutrition: Goal: Risk of aspiration will decrease Outcome: Progressing Goal: Dietary intake will improve Outcome: Progressing   Problem: Intracerebral Hemorrhage Tissue Perfusion: Goal: Complications of Intracerebral Hemorrhage will be minimized Outcome: Progressing

## 2021-02-24 NOTE — Progress Notes (Addendum)
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 Pearlean BrownieSethi, underwent extensive evaluation including negative TEE on 03/05/2020 and loop recorder insertion, was on DAPT for 3 weeks followed by aspirin alone; hypertension, hyperlipidemia presented [facility] ED with similar complaints as her prior stroke including dizziness, vertigo, nausea and vomiting.  LKW 9:30 PM on 5/9.  Code stroke activated.  Seen by neurology.  Admitted for acute right cerebellar stroke with concern for dissection on CTA, tPA deferred, treating with DAPT.  Neurology/stroke team consulting.   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  No intracranial hemorrhage or mass-effect.  Underlying chronic left ACA distribution infarct.  CTA head and neck: Focal intimal irregularity involving the distal right V3 segment, likely reflecting a short segment dissection.  No frank wrist dissection flap or significant luminal narrowing.  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  No other hemodynamically significant or correctable stenosis.  MRI brain: 4-5 cm region of acute infarction within the superior cerebellum on the right.  Punctate acute infarction in the right dorsal midbrain.  Mild swelling.  No evidence of acute hemorrhage.  Old infarction in the left superior cerebellum with atrophy, encephalomalacia and gliosis and hemosiderin deposition.  Stroke team did not recommend tPA  due to mild symptoms, being in the extended time window and concern for bleeding.  Was on aspirin 81 mg daily PTA.  Patient on home aspirin and Brilinta followed by Brilinta alone.  LDL 51.  A1c 6.  Both at goal.  Permissive hypertension treat SBP greater than 220 and DBP greater than 120.  PT/OT/speech therapist assessment are pending.  Follow 2D echo   Telemetry monitoring shows SR. I communicated with [facility] card master who had the loop recorder interrogated on 5/10 and was reported as no A. fib.  Patient still with unsteady gait.  Nausea seems to be better.  Essential hypertension  Allowing permissive hypertension.  Treat SBP greater than 220 or DBP greater than 120.  Hyperlipidemia  LDL 51.  Patient noted to be on atorvastatin.  To continue with home regimen as recommended by stroke team.  LFTs were unremarkable.    Leukocytosis  Suspect reactive in the setting of acute stroke.  Nausea and vomiting likely from acute cerebellar stroke  IV antiemetics as needed  As per floor RN, passed bedside RN swallow screen and has been ordered a diet.  Gentle IVF until able to consistently tolerate oral intake.  Hypokalemia  Replaced.  Magnesium normal.    DVT prophylaxis: enoxaparin (LOVENOX) injection 40 mg Start: 02/23/21 1000     Code Status: Full Code Family Communication: Discussed with the patient.  No family at bedside. Disposition: To be determined  Status is: Inpatient  Remains inpatient appropriate because:Inpatient level of care appropriate due to severity of illness   Dispo:  Patient From: Home  Planned Disposition: Home  Medically stable for discharge: No          Consultants:   Neurology  Procedures:   None  Antimicrobials:    Anti-infectives (From admission, onward)   None        Subjective:  Patient continues to have difficulty speaking and difficulty ambulating.  Denies any nausea at this morning.  Admits to blurry  vision.    Objective:   Vitals:   02/23/21 2055 02/23/21 2325 02/24/21 0328 02/24/21 0757  BP:  118/60 135/69 (!) 154/68  Pulse:  68 67 63  Resp:  17 16 16   Temp: 99.3 F (37.4 C) 98.8 F (37.1 C) 98.2 F (36.8 C) 98.3 F (36.8 C)  TempSrc: Oral Oral Oral Oral  SpO2:  97% 97% 99%  Weight:      Height:        General appearance: Awake alert.  In no distress Resp: Clear to auscultation bilaterally.  Normal effort Cardio: S1-S2 is normal regular.  No S3-S4.  No rubs murmurs or bruit GI: Abdomen is soft.  Nontender nondistended.  Bowel sounds are present normal.  No masses organomegaly Extremities: No edema.  Full range of motion of lower extremities. Neurologic: Alert and oriented x3.  Dysarthria is noted.  Dysdiadochokinesis noted.        Data Reviewed:     CBC: Recent Labs  Lab 02/22/21 2257 02/23/21 0045 02/23/21 0901  WBC 11.2*  --  6.9  NEUTROABS 4.2  --   --   HGB 12.3 12.6 12.6  HCT 37.1 37.0 38.5  MCV 87.5  --  86.9  PLT 263  --  238    Basic Metabolic Panel: Recent Labs  Lab 02/22/21 2257 02/23/21 0045 02/23/21 0745  NA 136 139 134*  K 2.8* 3.9 3.8  CL 106 104 105  CO2 24  --  22  GLUCOSE 120* 139* 123*  BUN 13 14 9   CREATININE 0.53 0.40* 0.54  CALCIUM 8.7*  --  8.4*  MG  --   --  1.9  PHOS  --   --  3.6    Liver Function Tests: Recent Labs  Lab 02/23/21 0901  AST 43*  ALT 37  ALKPHOS 86  BILITOT 0.6  PROT 7.8  ALBUMIN 3.8    CBG: Recent Labs  Lab 02/23/21 0259  GLUCAP 126*    Microbiology Studies:   Recent Results (from the past 240 hour(s))  Resp Panel by RT-PCR (Flu A&B, Covid) Nasopharyngeal Swab     Status: None   Collection Time: 02/23/21  1:22 AM   Specimen: Nasopharyngeal Swab; Nasopharyngeal(NP) swabs in vial transport medium  Result Value Ref Range Status   SARS Coronavirus 2 by RT PCR NEGATIVE NEGATIVE Final    Comment: (NOTE) SARS-CoV-2 target nucleic acids are NOT DETECTED.  The SARS-CoV-2 RNA is  generally detectable in upper respiratory specimens during the acute phase of infection. The lowest concentration of SARS-CoV-2 viral copies this assay can detect is 138 copies/mL. A negative result does not preclude SARS-Cov-2 infection and should not be used as the sole basis for treatment or other patient management decisions. A negative result may occur with  improper specimen collection/handling, submission of specimen other than nasopharyngeal swab, presence of viral mutation(s) within the areas targeted by this assay, and inadequate number of viral copies(<138 copies/mL). A negative result must be combined with clinical observations, patient history, and epidemiological information. The expected result is Negative.  Fact Sheet for Patients:  04/25/21  Fact Sheet for Healthcare Providers:  04/25/21  This test is no t yet approved or cleared by the [employer]  FDA and  has been authorized for detection and/or diagnosis of SARS-CoV-2 by FDA under an Emergency Use Authorization (EUA). This EUA will remain  in effect (meaning this test can be used) for the duration of the COVID-19 declaration under Section 564(b)(1) of the Act, 21 U.S.C.section 360bbb-3(b)(1), unless the authorization is terminated  or revoked sooner.       Influenza A by PCR NEGATIVE NEGATIVE Final   Influenza B by PCR NEGATIVE NEGATIVE Final    Comment: (NOTE) The Xpert Xpress SARS-CoV-2/FLU/RSV plus assay is intended as an aid in the diagnosis of influenza from Nasopharyngeal swab specimens and should not be used as a sole basis for treatment. Nasal washings and aspirates are unacceptable for Xpert Xpress SARS-CoV-2/FLU/RSV testing.  Fact Sheet for Patients: [employer]  Fact Sheet for Healthcare Providers: [employer]it  This test is not yet approved or cleared by the [place] FDA and has been authorized for detection and/or diagnosis of SARS-CoV-2 by FDA under an Emergency Use Authorization (EUA). This EUA will remain in effect (meaning this test can be used) for the duration of the COVID-19 declaration under Section 564(b)(1) of the Act, 21 U.S.C. section 360bbb-3(b)(1), unless the authorization is terminated or revoked.  Performed at [facility], [address], [place], [place] [address]      Radiology Studies:  MR BRAIN WO CONTRAST  Result Date: 02/23/2021 CLINICAL DATA:  Dizziness, nausea and vomiting. EXAM: MRI HEAD WITHOUT CONTRAST TECHNIQUE: Multiplanar, multiecho pulse sequences of the brain and surrounding structures were obtained without intravenous contrast. COMPARISON:  CT studies same day FINDINGS: Brain: Diffusion imaging shows a 4-5 cm region of acute infarction within the superior cerebellum on the right. Acute infarction also within the dorsal right midbrain. Old infarction within the left superior cerebellum. Mild swelling but no acute hemorrhage in the right cerebellum. Old hemosiderin residual within the left cerebellar stroke. Cerebral hemispheres are normal. No mass, hydrocephalus or extra-axial collection. Vascular: No acute vascular finding by standard MRI. Skull and upper cervical spine: Negative Sinuses/Orbits: Mucosal inflammatory changes of the left maxillary and sphenoid sinuses. Orbits negative. Other: None IMPRESSION: 4-5 cm region of acute infarction within the superior cerebellum on the right. Punctate acute infarction in the right dorsal midbrain. Mild swelling. No evidence of acute hemorrhage. Old infarction in the left superior cerebellum with atrophy, encephalomalacia and gliosis and hemosiderin deposition. Electronically Signed   By: Paulina Fusi M.D.   On: 02/23/2021 08:42   CT HEAD CODE STROKE WO CONTRAST  Result Date: 02/23/2021 CLINICAL DATA:  Code stroke. Initial evaluation for neuro deficit, stroke suspected.  EXAM: CT HEAD WITHOUT CONTRAST TECHNIQUE: Contiguous axial images were obtained from the base of the skull through the vertex without intravenous contrast. COMPARISON:  Prior CT from 01/31/2020. FINDINGS: Brain: Streak/beam hardening artifact emanating from the left occipital calvarium noted, mildly limiting assessment. Cerebral volume within normal limits. Focal encephalomalacia at the central and left superior cerebellum, consistent with a chronic left MCA distribution infarct. Subtle evolving hypodensity noted involving the superior right cerebellum and cerebellar vermis, concerning for an acute right SCA distribution infarct (series 3, image 12, 11). No associated hemorrhage or mass effect. No other evidence for acute large vessel territory infarct. No intracranial hemorrhage. No mass lesion, mass effect or midline shift. No hydrocephalus or extra-axial fluid collection. Vascular: No hyperdense vessel. Skull: Scalp soft tissues demonstrate no acute finding. Small focal exostosis noted at the right frontal calvarium. Calvarium intact. Sinuses/Orbits: Globes and orbital soft tissues within  normal limits. Moderate left sphenoid and maxillary sinusitis. Additional scattered pneumatized secretions noted within the ethmoidal air cells. Subcentimeter osteoma noted within the right ethmoidal air cells as well. Mastoid air cells are clear. Other: None. ASPECTS [facility] Stroke Program Early CT Score) - Ganglionic level infarction (caudate, lentiform nuclei, internal capsule, insula, M1-M3 cortex): 7 - Supraganglionic infarction (M4-M6 cortex): 3 Total score (0-10 with 10 being normal): 10 IMPRESSION: 1. Evolving hypodensity involving the superior right cerebellum/cerebellar vermis, concerning for an acute right SCA distribution infarct. No intracranial hemorrhage or mass effect. 2. ASPECTS is 10. 3. Underlying chronic left SCA distribution infarcts. Critical Value/emergent results were called by telephone at the time of  interpretation on 02/23/2021 at 12:55 a.m. to provider Dr. Amada Jupiter, Who verbally acknowledged these results. Electronically Signed   By: Rise Mu M.D.   On: 02/23/2021 01:23   CT ANGIO HEAD NECK W WO CM (CODE STROKE)  Result Date: 02/23/2021 CLINICAL DATA:  Initial evaluation for neuro deficit, stroke suspected. EXAM: CT ANGIOGRAPHY HEAD AND NECK TECHNIQUE: Multidetector CT imaging of the head and neck was performed using the standard protocol during bolus administration of intravenous contrast. Multiplanar CT image reconstructions and MIPs were obtained to evaluate the vascular anatomy. Carotid stenosis measurements (when applicable) are obtained utilizing NASCET criteria, using the distal internal carotid diameter as the denominator. CONTRAST:  75mL OMNIPAQUE IOHEXOL 350 MG/ML SOLN COMPARISON:  Prior head CT from earlier the same day. FINDINGS: CTA NECK FINDINGS Aortic arch: Visualized aortic arch normal in caliber with normal branch pattern. No stenosis about the origin of the great vessels. Right carotid system: Right common carotid artery patent from its origin to the bifurcation without stenosis. Minimal plaque about the right carotid bulb without stenosis. Right ICA patent distally without stenosis, dissection or occlusion. Left carotid system: Left CCA patent from its origin to the bifurcation without stenosis. Minimal plaque about the left carotid bulb without significant stenosis. Left ICA patent distally without stenosis, dissection or occlusion. Vertebral arteries: Left vertebral artery arises directly from the aortic arch. Right vertebral artery dominant. Vertebral arteries widely patent within the neck. Just prior to the skull base/dural reflection, there is distal right V3 segment (series 9, image 83), suspicious for a possible short-segment dissection. Small amount of probable intraluminal thrombus protrudes into the vascular lumen at this level. No frank raised dissection flap is  seen. No more than mild stenosis at this level. The right vertebral artery is relatively normal just distally as it crosses into the cranial vault. Skeleton: No acute osseous finding. No discrete or worrisome osseous lesions. Mild cervical spondylosis noted at C5-6 without significant stenosis. Other neck: No other acute soft tissue abnormality within the neck. No mass or adenopathy. Upper chest: Visualized upper chest demonstrates no acute finding. Review of the MIP images confirms the above findings CTA HEAD FINDINGS Anterior circulation: Petrous, cavernous, and supraclinoid segments patent without stenosis or other abnormality. A1 segments widely patent. Normal anterior communicating artery complex. Anterior cerebral arteries patent to their distal aspects without stenosis. No M1 stenosis or occlusion. Normal MCA bifurcations. Distal MCA branches well perfused and symmetric. Posterior circulation: Both V4 segments patent to the vertebrobasilar junction without stenosis. Both PICA origins patent and normal. Basilar somewhat diminutive but patent to its distal aspect without stenosis. Left superior cerebral artery patent. There is occlusion of the mid-distal right superior cerebellar artery, likely embolic (series 8, image 105). Left PCA supplied via the basilar as well as a prominent left posterior communicating artery. Predominant  fetal type origin of the right PCA. Both PCAs remain widely patent to their distal aspects. Venous sinuses: Grossly patent allowing for timing the contrast bolus. Anatomic variants: Predominant fetal type origin of the right PCA. Review of the MIP images confirms the above findings IMPRESSION: 1. Focal intimal irregularity involving the distal right V3 segment, likely reflecting a short-segment dissection. No frank raised dissection flap or significant luminal narrowing. 2. 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 3. Mild for age  atheromatous disease elsewhere about the major arterial vasculature of the head and neck. No other hemodynamically significant or correctable stenosis. These results were communicated to Dr. Amada Jupiter at 1:22 amon 5/10/2022by text page via the [facility] messaging system. Electronically Signed   By: Rise Mu M.D.   On: 02/23/2021 01:38     Scheduled Meds:   . aspirin EC  81 mg Oral Daily  . atorvastatin  80 mg Oral Daily  . enoxaparin (LOVENOX) injection  40 mg Subcutaneous Q24H  . ticagrelor  90 mg Oral BID    Continuous Infusions:   . sodium chloride 75 mL/hr at 02/24/21 0143     LOS: 1 day     Osvaldo Shipper [facility]    To contact the attending provider between 7A-7P or the covering provider during after hours 7P-7A, please log into the web site [email or web address] and access using universal [place] password for that web site. If you do not have the password, please call the hospital operator.  02/24/2021, 9:05 AM

## 2021-02-25 LAB — CBC
HCT: 33.7 % — ABNORMAL LOW (ref 36.0–46.0)
Hemoglobin: 11.3 g/dL — ABNORMAL LOW (ref 12.0–15.0)
MCH: 28.9 pg (ref 26.0–34.0)
MCHC: 33.5 g/dL (ref 30.0–36.0)
MCV: 86.2 fL (ref 80.0–100.0)
Platelets: 205 10*3/uL (ref 150–400)
RBC: 3.91 MIL/uL (ref 3.87–5.11)
RDW: 12.6 % (ref 11.5–15.5)
WBC: 6.4 10*3/uL (ref 4.0–10.5)
nRBC: 0 % (ref 0.0–0.2)

## 2021-02-25 LAB — URINALYSIS, ROUTINE W REFLEX MICROSCOPIC
Bilirubin Urine: NEGATIVE
Glucose, UA: NEGATIVE mg/dL
Ketones, ur: NEGATIVE mg/dL
Leukocytes,Ua: NEGATIVE
Nitrite: NEGATIVE
Protein, ur: NEGATIVE mg/dL
RBC / HPF: >50 RBC/hpf — ABNORMAL HIGH (ref 0–5)
Specific Gravity, Urine: 1.012 (ref 1.005–1.030)
pH: 7 (ref 5.0–8.0)

## 2021-02-25 LAB — BASIC METABOLIC PANEL
Anion gap: 5 (ref 5–15)
BUN: 12 mg/dL (ref 6–20)
CO2: 26 mmol/L (ref 22–32)
Calcium: 8.6 mg/dL — ABNORMAL LOW (ref 8.9–10.3)
Chloride: 106 mmol/L (ref 98–111)
Creatinine, Ser: 0.63 mg/dL (ref 0.44–1.00)
GFR, Estimated: >60 mL/min (ref >60)
Glucose, Bld: 118 mg/dL — ABNORMAL HIGH (ref 70–99)
Potassium: 3.1 mmol/L — ABNORMAL LOW (ref 3.5–5.1)
Sodium: 137 mmol/L (ref 135–145)

## 2021-02-25 LAB — MAGNESIUM: Magnesium: 2 mg/dL (ref 1.7–2.4)

## 2021-02-25 MED ORDER — POTASSIUM CHLORIDE CRYS ER 20 MEQ PO TBCR
40.0000 meq | EXTENDED_RELEASE_TABLET | Freq: Two times a day (BID) | ORAL | Status: AC
  Administered 2021-02-25 (×2): 40 meq via ORAL
  Filled 2021-02-25 (×2): qty 2

## 2021-02-25 MED ORDER — SENNOSIDES-DOCUSATE SODIUM 8.6-50 MG PO TABS
2.0000 | ORAL_TABLET | Freq: Two times a day (BID) | ORAL | Status: DC
  Administered 2021-02-25 (×2): 2 via ORAL
  Filled 2021-02-25 (×3): qty 2

## 2021-02-25 MED ORDER — POLYETHYLENE GLYCOL 3350 17 G PO PACK
17.0000 g | PACK | Freq: Every day | ORAL | Status: DC
  Administered 2021-02-25: 17 g via ORAL
  Filled 2021-02-25 (×2): qty 1

## 2021-02-25 NOTE — Progress Notes (Signed)
IP rehab admissions - I met with patient and her husband.  Patient is known to me from rehab stay 04/21 after CVA.  They would like inpatient rehab stay.  I will begin preauthorization with insurance carrier today.  I will have one of my partners follow up tomorrow for plans.  Call for questions.  [phone]
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
## 2021-02-25 NOTE — Progress Notes (Addendum)
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 [name], underwent extensive evaluation including negative TEE on 03/05/2020 and loop recorder insertion, was on DAPT for 3 weeks followed by aspirin alone; hypertension, hyperlipidemia presented to [facility] ED with similar complaints as her prior stroke including dizziness, vertigo, nausea and vomiting.  LKW 9:30 PM on 5/9.  Code stroke activated.  Seen by neurology.  Admitted for acute right cerebellar stroke with concern for dissection on CTA, tPA deferred, treating with DAPT.  Neurology/stroke team consulting.   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  No intracranial hemorrhage or mass-effect.  Underlying chronic left ACA distribution infarct.  CTA head and neck: Focal intimal irregularity involving the distal right V3 segment, likely reflecting a short segment dissection.  No frank wrist dissection flap or significant luminal narrowing.  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  No other hemodynamically significant or correctable stenosis.  MRI brain: 4-5 cm region of acute infarction within the superior cerebellum on the right.  Punctate acute infarction in the right dorsal midbrain.  Mild swelling.  No evidence of acute hemorrhage.  Old infarction in the left superior cerebellum with atrophy, encephalomalacia and gliosis and hemosiderin deposition.  Stroke team did not recommend tPA  due to mild symptoms, being in the extended time window and concern for bleeding.  Was on aspirin 81 mg daily PTA.  Patient on home aspirin and Brilinta followed by Brilinta alone.  LDL 51.  A1c 6.    PT/OT/speech therapist assessment are pending.  Follow 2D echo   Hypercoagulable panel was ordered by neurology.  Telemetry monitoring shows SR. Loop recorder interrogated on 5/10 and was reported as no A. fib.  Patient mentions that she is feeling slightly better.  A little bit more steady with her gait.  Nausea is better.  Continues to have blurry vision.  Urinary frequency and discomfort Patient reports some difficulty passing urine.  We will check a UA.  Essential hypertension  Allowing permissive hypertension.  Should be able to resume her amlodipine in the next 24 to 48 hours.  Hyperlipidemia  LDL 51.  Patient noted to be on atorvastatin.  To continue with home regimen as recommended by stroke team.  LFTs were unremarkable.    Leukocytosis  Suspect reactive in the setting of acute stroke.  Nausea and vomiting likely from acute cerebellar stroke  Seems to have improved.  Hypokalemia  Potassium to be replaced today.    DVT prophylaxis: enoxaparin (LOVENOX) injection 40 mg Start: 02/23/21 1000     Code Status: Full Code Family Communication: Discussed with the patient.  No family at bedside. Disposition: CIR is recommended  Status is: Inpatient  Remains inpatient appropriate because:Inpatient level of care appropriate due to severity of illness   Dispo:  Patient From: Home  Planned Disposition: Home  Medically stable for discharge: No          Consultants:   Neurology  Procedures:   None  Antimicrobials:  Anti-infectives (From admission, onward)   None        Subjective:  Patient mentions frequent urination and discomfort with urinating.  Denies any nausea vomiting.  Vision is improving.  Unsteadiness is improving.  Objective:    Vitals:   02/24/21 2007 02/24/21 2322 02/25/21 0424 02/25/21 0807  BP: (!) 146/79 (!) 161/79 139/65 (!) 141/83  Pulse: 69 70 65 79  Resp: 17 17 17 18   Temp: 98.6 F (37 C) 98.5 F (36.9 C) 98 F (36.7 C) 99 F (37.2 C)  TempSrc: Oral Oral  Oral  SpO2: 100% 99% 97% 99%  Weight:      Height:        General appearance: Awake alert.  In no distress Resp: Clear to auscultation bilaterally.  Normal effort Cardio: S1-S2 is normal regular.  No S3-S4.  No rubs murmurs or bruit GI: Abdomen is soft.  Nontender nondistended.  Bowel sounds are present normal.  No masses organomegaly Extremities: No edema.  Full range of motion of lower extremities. Neurologic: Alert and oriented x3.  Dysdiadochokinesis noted         Data Reviewed:     CBC: Recent Labs  Lab 02/22/21 2257 02/23/21 0045 02/23/21 0901 02/25/21 0409  WBC 11.2*  --  6.9 6.4  NEUTROABS 4.2  --   --   --   HGB 12.3 12.6 12.6 11.3*  HCT 37.1 37.0 38.5 33.7*  MCV 87.5  --  86.9 86.2  PLT 263  --  238 205    Basic Metabolic Panel: Recent Labs  Lab 02/22/21 2257 02/23/21 0045 02/23/21 0745 02/25/21 0409  NA 136 139 134* 137  K 2.8* 3.9 3.8 3.1*  CL 106 104 105 106  CO2 24  --  22 26  GLUCOSE 120* 139* 123* 118*  BUN 13 14 9 12   CREATININE 0.53 0.40* 0.54 0.63  CALCIUM 8.7*  --  8.4* 8.6*  MG  --   --  1.9 2.0  PHOS  --   --  3.6  --     Liver Function Tests: Recent Labs  Lab 02/23/21 0901  AST 43*  ALT 37  ALKPHOS 86  BILITOT 0.6  PROT 7.8  ALBUMIN 3.8    CBG: Recent Labs  Lab 02/23/21 0259  GLUCAP 126*    Microbiology Studies:   Recent Results (from the past 240 hour(s))  Resp Panel by RT-PCR (Flu A&B, Covid) Nasopharyngeal Swab     Status: None   Collection Time: 02/23/21  1:22 AM   Specimen: Nasopharyngeal Swab; Nasopharyngeal(NP) swabs in vial transport medium  Result Value Ref Range Status   SARS Coronavirus 2 by RT PCR NEGATIVE NEGATIVE Final    Comment: (NOTE) SARS-CoV-2  target nucleic acids are NOT DETECTED.  The SARS-CoV-2 RNA is generally detectable in upper respiratory specimens during the acute phase of infection. The lowest concentration of SARS-CoV-2 viral copies this assay can detect is 138 copies/mL. A negative result does not preclude SARS-Cov-2 infection and should not be used as the sole basis for treatment or other patient management decisions. A negative result may occur with  improper specimen collection/handling, submission of specimen other than nasopharyngeal swab, presence of viral mutation(s) within the areas targeted by this assay, and inadequate number of viral copies(<138 copies/mL). A negative result must be combined with clinical observations, patient history, and epidemiological information. The expected result is Negative.  Fact Sheet for Patients:  04/25/21  Fact Sheet for Healthcare Providers:  04/25/21  This  test is no t yet approved or cleared by the [place] and  has been authorized for detection and/or diagnosis of SARS-CoV-2 by FDA under an Emergency Use Authorization (EUA). This EUA will remain  in effect (meaning this test can be used) for the duration of the COVID-19 declaration under Section 564(b)(1) of the Act, 21 U.S.C.section 360bbb-3(b)(1), unless the authorization is terminated  or revoked sooner.       Influenza A by PCR NEGATIVE NEGATIVE Final   Influenza B by PCR NEGATIVE NEGATIVE Final    Comment: (NOTE) The Xpert Xpress SARS-CoV-2/FLU/RSV plus assay is intended as an aid in the diagnosis of influenza from Nasopharyngeal swab specimens and should not be used as a sole basis for treatment. Nasal washings and aspirates are unacceptable for Xpert Xpress SARS-CoV-2/FLU/RSV testing.  Fact Sheet for Patients: BloggerCourse.com  Fact Sheet for Healthcare  Providers: SeriousBroker.it  This test is not yet approved or cleared by the [place] FDA and has been authorized for detection and/or diagnosis of SARS-CoV-2 by FDA under an Emergency Use Authorization (EUA). This EUA will remain in effect (meaning this test can be used) for the duration of the COVID-19 declaration under Section 564(b)(1) of the Act, 21 U.S.C. section 360bbb-3(b)(1), unless the authorization is terminated or revoked.  Performed at [facility], [address] [place], [place] [address]      Radiology Studies:  No results found.   Scheduled Meds:   . aspirin EC  81 mg Oral Daily  . atorvastatin  80 mg Oral Daily  . enoxaparin (LOVENOX) injection  40 mg Subcutaneous Q24H  . polyethylene glycol  17 g Oral Daily  . potassium chloride  40 mEq Oral BID  . senna-docusate  2 tablet Oral BID  . ticagrelor  90 mg Oral BID    Continuous Infusions:      LOS: 2 days     Osvaldo Shipper [facility]    To contact the attending provider between 7A-7P or the covering provider during after hours 7P-7A, please log into the web site [email or web address] and access using universal [place] password for that web site. If you do not have the password, please call the hospital operator.  02/25/2021, 10:27 AM

## 2021-02-25 NOTE — Progress Notes (Signed)
Carelink Summary Report / Loop Recorder 

## 2021-02-25 NOTE — Progress Notes (Signed)
STROKE TEAM PROGRESS NOTE   INTERVAL HISTORY No acute events Patient now has a left eye patch and feels this has helped her diplopia.  She is able to ambulate better.  Her right-sided dysmetria also seems to be improving.  The hypercoagulable labs which are back so far are all normal which include homocystine, anticardiolipin antibodies, beta-2 glycoprotein, lupus anticoagulant, protein is in situ to limit activity and Antithrombin III. Vitals:   02/24/21 2007 02/24/21 2322 02/25/21 0424 02/25/21 0807  BP: (!) 146/79 (!) 161/79 139/65 (!) 141/83  Pulse: 69 70 65 79  Resp: 17 17 17 18   Temp: 98.6 F (37 C) 98.5 F (36.9 C) 98 F (36.7 C) 99 F (37.2 C)  TempSrc: Oral Oral  Oral  SpO2: 100% 99% 97% 99%  Weight:      Height:       CBC:  Recent Labs  Lab 02/22/21 2257 02/23/21 0045 02/23/21 0901 02/25/21 0409  WBC 11.2*  --  6.9 6.4  NEUTROABS 4.2  --   --   --   HGB 12.3   < > 12.6 11.3*  HCT 37.1   < > 38.5 33.7*  MCV 87.5  --  86.9 86.2  PLT 263  --  238 205   < > = values in this interval not displayed.   Basic Metabolic Panel:  Recent Labs  Lab 02/23/21 0745 02/25/21 0409  NA 134* 137  K 3.8 3.1*  CL 105 106  CO2 22 26  GLUCOSE 123* 118*  BUN 9 12  CREATININE 0.54 0.63  CALCIUM 8.4* 8.6*  MG 1.9 2.0  PHOS 3.6  --    Lipid Panel:  Recent Labs  Lab 02/23/21 0745  CHOL 105  TRIG 28  HDL 48  CHOLHDL 2.2  VLDL 6  LDLCALC 51   HgbA1c:  Recent Labs  Lab 02/23/21 0901  HGBA1C 6.0*   Urine Drug Screen:  Recent Labs  Lab 02/23/21 0033  LABOPIA NONE DETECTED  COCAINSCRNUR NONE DETECTED  LABBENZ NONE DETECTED  AMPHETMU NONE DETECTED  THCU NONE DETECTED  LABBARB NONE DETECTED    Alcohol Level  Recent Labs  Lab 02/23/21 0040  ETH <10   IMAGING AND PERTINENT DIAGNOSTICS MR Brain 4-5 cm region of acute infarction within the superior cerebellum on the right. Punctate acute infarction in the right dorsal midbrain. Mild swelling. No evidence of  acute hemorrhage. Old infarction in the left superior cerebellum with atrophy, encephalomalacia and gliosis and hemosiderin deposition.  CT Head Code Stroke 1. Evolving hypodensity involving the superior right cerebellum/cerebellar vermis, concerning for an acute right SCA distribution infarct. No intracranial hemorrhage or mass effect. 2. ASPECTS is 10. 3. Underlying chronic left SCA distribution infarcts. PHYSICAL EXAM Constitutional: Pleasant middle-age Caucasian 04/25/21 lady appears well-developed and well-nourished.  Psych: Affect appropriate to situation Eyes: No scleral injection HENT: No OP obstruction MSK: no joint deformities.  Cardiovascular: Normal rate and regular rhythm.  Respiratory: Effort normal, non-labored breathing GI: Soft.  No distension. There is no tenderness.  Skin: WDI  Neuro: Mental Status: Patient is awake, alert, oriented to person, place, month, year, and situation. Patient is able to give a clear and coherent history. No signs of aphasia or neglect Cranial Nerves: II: Visual Fields are full. Pupils are equal, round, and reactive to light.   III,IV, VI: EOMI without ptosis or diploplia.  Mild saccadic dysmetria on lateral gaze bilaterally but no nystagmus.  No eye movement restriction but subjective vertical diplopia on downgaze  V: Facial sensation is symmetric to temperature VII: Facial movement is symmetric.  VIII: hearing is intact to voice X: Uvula elevates symmetrically XI: Shoulder shrug is symmetric. XII: tongue is midline without atrophy or fasciculations.  Motor: Tone is normal. Bulk is normal. 5/5 strength was present in all four extremities.  Sensory: Sensation is symmetric to light touch and temperature in the arms and legs. Deep Tendon Reflexes: 2+ and symmetric in the biceps and patellae.  Plantars: Toes are downgoing bilaterally.  Cerebellar: FNF and HKS are intact on the left, impaired on the right arm >right  leg   ASSESSMENT/PLAN Judy Gutierrez is a 60 y.o. female with a history of previous stroke affecting the left cerebellum who presents with similar symptoms. She states that she was in her normal state of health 5/9 until  developing sudden onset of vertigo, nausea and vomiting around 9:45 PM for which she called EMS. No tPA was given. Admitted to the floor with  acute right cerebellar stroke with concern for dissection on CTA.   Recurrent right cerebellar cryptogenic stroke.  Past history of left cerebellar cryptogenic stroke in April 2021 with negative extensive work-up including TEE and loop recorder and anticoagulants and   Code Stroke: Evolving hypodensity involving the superior right cerebellum/cerebellar vermis, concerning for an acute right SCA distribution infarct.   CTA head and neck: Focal intimal irregularity involving the distal right V3 segment, Downstream occlusion of the mid-distal right superior cerebellar artery, likely embolic    MRI: right superior cerebellum acute infarction  Loop recorder interrogation: No atrial fib or other arrhythmia which may contribute to stroke   2D Echo completed, result is pending  Hypercoag panel : Protein C and S, Antithrombin III, lupus anticoagulant, anticardiolipin antibodies, homocystine, all normal.  Factor V Leiden and prothrombin gene mutation are pending   VTE prophylaxis -SCDs    Diet   Diet vegetarian Room service appropriate? Yes; Fluid consistency: Thin   On ASA 81mg  prior to admission.   Plan for Brilinta 90 BID and ASA 81mg  x 4 weeks then Brilinta alone  Therapy recommendations:  CIR  Disposition:  TBD  Neurology will sign off  Hypertension  Stable . Permissive hypertension (OK if < 220/120) but gradually normalize in 5-7 days . Long-term BP goal normotensive  Hyperlipidemia  Home meds:  Lipitor 40mg   LDL 51, at goal < 70  High intensity statin: already at goal on home regimen  Continue statin at  discharge  Other Stroke Risk Factors  Hx stroke/TIA: Cerebellar 2019  Other Active Problems Continue aspirin and Brilinta for 4 weeks followed by Brilinta alone if she can afford.  Continue ongoing therapies.  Transfer to rehab.  May consider possible participation in study if interested.  Stroke team will sign off.  Follow-up as an outpatient in stroke clinic in 6 weeks.  Greater than 50% time during this 25-minute visit was spent on counseling and coordination of care about her stroke and answering questions.  Discussed with Dr. . 2020, MD

## 2021-02-26 ENCOUNTER — Encounter (HOSPITAL_COMMUNITY): Payer: Self-pay | Admitting: Physical Medicine & Rehabilitation

## 2021-02-26 ENCOUNTER — Inpatient Hospital Stay (HOSPITAL_COMMUNITY)
Admission: RE | Admit: 2021-02-26 | Discharge: 2021-03-16 | DRG: 091 | Disposition: A | Discharge status: Home Health | Payer: No Typology Code available for payment source | Source: Intra-hospital | Attending: Physical Medicine & Rehabilitation | Admitting: Physical Medicine & Rehabilitation

## 2021-02-26 ENCOUNTER — Other Ambulatory Visit: Payer: Self-pay

## 2021-02-26 DIAGNOSIS — E785 Hyperlipidemia, unspecified: Secondary | ICD-10-CM | POA: Diagnosis present

## 2021-02-26 DIAGNOSIS — H539 Unspecified visual disturbance: Secondary | ICD-10-CM

## 2021-02-26 DIAGNOSIS — I69322 Dysarthria following cerebral infarction: Secondary | ICD-10-CM

## 2021-02-26 DIAGNOSIS — Z7982 Long term (current) use of aspirin: Secondary | ICD-10-CM

## 2021-02-26 DIAGNOSIS — Z8673 Personal history of transient ischemic attack (TIA), and cerebral infarction without residual deficits: Secondary | ICD-10-CM

## 2021-02-26 DIAGNOSIS — I615 Nontraumatic intracerebral hemorrhage, intraventricular: Secondary | ICD-10-CM | POA: Diagnosis not present

## 2021-02-26 DIAGNOSIS — G936 Cerebral edema: Secondary | ICD-10-CM | POA: Diagnosis not present

## 2021-02-26 DIAGNOSIS — E871 Hypo-osmolality and hyponatremia: Secondary | ICD-10-CM | POA: Diagnosis present

## 2021-02-26 DIAGNOSIS — R2689 Other abnormalities of gait and mobility: Secondary | ICD-10-CM | POA: Diagnosis present

## 2021-02-26 DIAGNOSIS — R7401 Elevation of levels of liver transaminase levels: Secondary | ICD-10-CM

## 2021-02-26 DIAGNOSIS — H532 Diplopia: Secondary | ICD-10-CM | POA: Diagnosis present

## 2021-02-26 DIAGNOSIS — R270 Ataxia, unspecified: Secondary | ICD-10-CM | POA: Diagnosis not present

## 2021-02-26 DIAGNOSIS — I639 Cerebral infarction, unspecified: Secondary | ICD-10-CM | POA: Diagnosis not present

## 2021-02-26 DIAGNOSIS — D62 Acute posthemorrhagic anemia: Secondary | ICD-10-CM | POA: Diagnosis present

## 2021-02-26 DIAGNOSIS — I10 Essential (primary) hypertension: Secondary | ICD-10-CM

## 2021-02-26 DIAGNOSIS — I63441 Cerebral infarction due to embolism of right cerebellar artery: Secondary | ICD-10-CM | POA: Diagnosis present

## 2021-02-26 DIAGNOSIS — R000 Tachycardia, unspecified: Secondary | ICD-10-CM

## 2021-02-26 DIAGNOSIS — I69398 Other sequelae of cerebral infarction: Secondary | ICD-10-CM | POA: Diagnosis not present

## 2021-02-26 DIAGNOSIS — R42 Dizziness and giddiness: Secondary | ICD-10-CM | POA: Diagnosis present

## 2021-02-26 DIAGNOSIS — I69312 Visuospatial deficit and spatial neglect following cerebral infarction: Secondary | ICD-10-CM | POA: Diagnosis not present

## 2021-02-26 DIAGNOSIS — I63513 Cerebral infarction due to unspecified occlusion or stenosis of bilateral middle cerebral arteries: Secondary | ICD-10-CM | POA: Diagnosis not present

## 2021-02-26 DIAGNOSIS — Z79899 Other long term (current) drug therapy: Secondary | ICD-10-CM

## 2021-02-26 DIAGNOSIS — G911 Obstructive hydrocephalus: Secondary | ICD-10-CM | POA: Diagnosis not present

## 2021-02-26 DIAGNOSIS — I7774 Dissection of vertebral artery: Secondary | ICD-10-CM | POA: Diagnosis present

## 2021-02-26 LAB — BASIC METABOLIC PANEL
Anion gap: 5 (ref 5–15)
BUN: 15 mg/dL (ref 6–20)
CO2: 27 mmol/L (ref 22–32)
Calcium: 9 mg/dL (ref 8.9–10.3)
Chloride: 105 mmol/L (ref 98–111)
Creatinine, Ser: 0.61 mg/dL (ref 0.44–1.00)
GFR, Estimated: >60 mL/min (ref >60)
Glucose, Bld: 118 mg/dL — ABNORMAL HIGH (ref 70–99)
Potassium: 4.3 mmol/L (ref 3.5–5.1)
Sodium: 137 mmol/L (ref 135–145)

## 2021-02-26 MED ORDER — ENOXAPARIN SODIUM 40 MG/0.4ML IJ SOSY: 40.0000 mg | PREFILLED_SYRINGE | INTRAMUSCULAR | Status: DC

## 2021-02-26 MED ORDER — ASPIRIN EC 81 MG PO TBEC
81.0000 mg | DELAYED_RELEASE_TABLET | Freq: Every day | ORAL | Status: DC
  Administered 2021-02-27 – 2021-03-09 (×11): 81 mg via ORAL
  Filled 2021-02-26 (×12): qty 1

## 2021-02-26 MED ORDER — ATORVASTATIN CALCIUM 80 MG PO TABS
80.0000 mg | ORAL_TABLET | Freq: Every day | ORAL | Status: DC
  Administered 2021-02-27 – 2021-03-16 (×18): 80 mg via ORAL
  Filled 2021-02-26 (×18): qty 1

## 2021-02-26 MED ORDER — POLYETHYLENE GLYCOL 3350 17 G PO PACK
17.0000 g | PACK | Freq: Every day | ORAL | Status: DC
  Administered 2021-02-27 – 2021-03-09 (×4): 17 g via ORAL
  Filled 2021-02-26 (×15): qty 1

## 2021-02-26 MED ORDER — ACETAMINOPHEN 325 MG PO TABS
650.0000 mg | ORAL_TABLET | Freq: Four times a day (QID) | ORAL | Status: DC | PRN
  Administered 2021-03-02 – 2021-03-07 (×2): 650 mg via ORAL
  Filled 2021-02-26 (×2): qty 2

## 2021-02-26 MED ORDER — ENOXAPARIN SODIUM 40 MG/0.4ML IJ SOSY
40.0000 mg | PREFILLED_SYRINGE | INTRAMUSCULAR | Status: DC
  Administered 2021-02-27 – 2021-03-10 (×12): 40 mg via SUBCUTANEOUS
  Filled 2021-02-26 (×12): qty 0.4

## 2021-02-26 MED ORDER — SENNOSIDES-DOCUSATE SODIUM 8.6-50 MG PO TABS
2.0000 | ORAL_TABLET | Freq: Two times a day (BID) | ORAL | Status: DC
  Administered 2021-02-26 – 2021-03-15 (×9): 2 via ORAL
  Filled 2021-02-26 (×24): qty 2

## 2021-02-26 MED ORDER — TICAGRELOR 90 MG PO TABS
90.0000 mg | ORAL_TABLET | Freq: Two times a day (BID) | ORAL | Status: DC
  Administered 2021-02-26 – 2021-03-01 (×6): 90 mg via ORAL
  Filled 2021-02-26 (×6): qty 1

## 2021-02-26 NOTE — Progress Notes (Signed)
Inpatient Rehabilitation Medication Review by a Pharmacist  A complete drug regimen review was completed for this patient to identify any potential clinically significant medication issues.  Clinically significant medication issues were identified:  no  Check AMION for pharmacist assigned to patient if future medication questions/issues arise during this admission.  Pharmacist comments:   Time spent performing this drug regimen review (minutes):  20   Avalin Briley 02/26/2021 7:34 PM

## 2021-02-26 NOTE — Progress Notes (Signed)
Marcello Fennel, MD  Physician  Physical Medicine and Rehabilitation  Consult Note    Signed  Date of Service:  02/24/2021 10:59 AM      Related encounter: ED to Hosp-Admission (Current) from 02/22/2021 in [place] [facility]       Signed      Expand All Collapse All    Show:Clear all [x] Manual[x] Template[] Copied  Added by: [x] Angiulli, , PA-C[x] , MD   [] Hover for details       Physical Medicine and Rehabilitation Consult Reason for Consult: Vertigo with right side incoordination and ataxic gait Referring Physician: Dr. Mcarthur Rossetti   HPI: Judy Gutierrez is a 60 y.o. right-handed female with history of prior CVA maintained on aspirin, hypertension, hyperlipidemia. History taken from chart review and patient.  Patient lives with spouse.  Independent prior to admission.  Two-level home bed and bath upstairs.  She has a sister with good support.  She presented on 02/22/2021 with vertigo, acute onset as well as nausea and vomiting.  CT of the head showed evolving hypodensity involving the superior right cerebellum/cerebellar vermis, concerning for acute right SCA distribution infarction.  No intracranial hemorrhage or mass-effect.  CT angiogram of the head showed   Focal intimal irregularity involving the distal right V3 segment likely reflecting a short segment dissection.  Downstream occlusion of the mid distal right superior cerebellar artery likely embolic.  MRI of the brain showed a 4-5 cm region of acute infarct within the superior cerebellum on the right.  Punctate acute infarct in the right dorsal midbrain.  Mild swelling.  Old infarction in the left superior cerebellum with atrophy, encephalomalacia.  Patient did not receive tPA.  Echocardiogram pending. Admission chemistries urine drug screen negative, alcohol negative, potassium 2.8, glucose 120.  Patient is currently maintained on low-dose aspirin as well as Brilinta for CVA  prophylaxis.  Subcutaneous Lovenox for DVT prophylaxis.  Tolerating a regular consistency diet.  Therapy evaluations completed due to patient's vertigo and decreased functional mobility recommendations of physical medicine rehab consult.  Review of Systems  Constitutional: Negative for chills and fever.  HENT: Negative for hearing loss.   Eyes: Positive for double vision. Negative for blurred vision.  Respiratory: Negative for cough and shortness of breath.   Cardiovascular: Negative for chest pain, palpitations and leg swelling.  Gastrointestinal: Negative for nausea and vomiting.  Genitourinary: Negative for dysuria, flank pain and hematuria.  Skin: Negative for rash.  Neurological: Positive for focal weakness and headaches.       Vertigo  All other systems reviewed and are negative.      Past Medical History:  Diagnosis Date  . Disturbance of skin sensation   . Myalgia and myositis, unspecified   . Other B-complex deficiencies   . 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 Stroke (HCC)   . Unspecified essential hypertension         Past Surgical History:  Procedure Laterality Date  . BUBBLE STUDY  03/05/2020   Procedure: BUBBLE STUDY;  Surgeon: 46, MD;  Location: [facility] ENDOSCOPY;  Service: Cardiovascular;;  . CESAREAN SECTION  1992  . LOOP RECORDER INSERTION N/A 03/05/2020   Procedure: LOOP RECORDER INSERTION;  Surgeon: [name], MD;  Location: [facility] INVASIVE CV LAB;  Service: Cardiovascular;  Laterality: N/A;  . TEE WITHOUT CARDIOVERSION N/A 03/05/2020   Procedure: TRANSESOPHAGEAL ECHOCARDIOGRAM (TEE);  Surgeon: [name], MD;  Location: [facility] ENDOSCOPY;  Service: Cardiovascular;  Laterality: N/A;  Family History  Problem Relation Age of Onset  . Pulmonary embolism Mother   . Hepatitis Father   . Diabetes Father   . Diabetes Mellitus II Sister   . Breast cancer Neg Hx    Social History:  reports that she  has never smoked. She has never used smokeless tobacco. She reports that she does not drink alcohol and does not use drugs. Allergies: No Known Allergies       Medications Prior to Admission  Medication Sig Dispense Refill  . amLODipine (NORVASC) 5 MG tablet Take 1 tablet (5 mg total) by mouth daily. 90 tablet 3  . aspirin 81 MG EC tablet Take 1 tablet (81 mg total) by mouth daily. 90 tablet 1  . atorvastatin (LIPITOR) 40 MG tablet Take 1 tablet (40 mg total) by mouth daily. 90 tablet 3  . Calcium-Magnesium-Vitamin D (CALCIUM MAGNESIUM PO) Take by mouth daily.    Marland Kitchen losartan (COZAAR) 50 MG tablet Take 1 tablet (50 mg total) by mouth daily. 90 tablet 3  . Multiple Minerals-Vitamins (MULTISOURCE CALCIUM MAG/D) TABS Take 1 tablet by mouth daily.    . Multiple Vitamin (MULTIVITAMIN WITH MINERALS) TABS tablet Take 1 tablet by mouth daily.      Home: Home Living Family/patient expects to be discharged to:: Private residence Living Arrangements: Spouse/significant other Available Help at Discharge: Family,Available 24 hours/day Type of Home: House Home Access: Stairs to enter [employer] of Steps: 4 Entrance Stairs-Rails: Right,Left Home Layout: Two level,Bed/bath upstairs Alternate Level Stairs-Rails: Left Bathroom Shower/Tub: Health visitor: Standard Home Equipment: Tub bench,Shower seat,Walker - 2 wheels  Functional History: Prior Function Level of Independence: Independent Comments: pt takes care of home, walks 2-3 miles for exercise. Pt's sister able to be with pt during the day as needed Functional Status:  Mobility: Bed Mobility Overal bed mobility: Needs Assistance Bed Mobility: Supine to Sit Supine to sit: Min guard General bed mobility comments: for safety, increased time with use of bedrails to perform Transfers Overall transfer level: Needs assistance Equipment used: 1 person hand held assist Transfers: Sit to/from Stand Sit to Stand:  Min assist General transfer comment: min assist to rise and steady, pt immediately reaching for environment to self-steady. STS x3, from EOB x2 and toilet x1. Ambulation/Gait Ambulation/Gait assistance: Min assist Gait Distance (Feet): 60 Feet Assistive device: 1 person hand held assist,IV Pole Gait Pattern/deviations: Step-through pattern,Decreased stride length,Ataxic,Narrow base of support General Gait Details: min assist to steady, R ataxia with circumduction observed. VC for room navigation, L eye occlusion glasses donned during session. Gait velocity: decr  ADL:  Cognition: Cognition Overall Cognitive Status: Impaired/Different from baseline Orientation Level: Oriented X4 Cognition Arousal/Alertness: Awake/alert Behavior During Therapy: WFL for tasks assessed/performed,Impulsive Overall Cognitive Status: Impaired/Different from baseline Area of Impairment: Safety/judgement,Problem solving Safety/Judgement: Decreased awareness of safety Problem Solving: Requires verbal cues,Requires tactile cues,Difficulty sequencing General Comments: Pt can be impulsive, requires cues to sit safely on support surfaces and await PT assist.  Blood pressure (!) 154/68, pulse 63, temperature 98.3 F (36.8 C), temperature source Oral, resp. rate 16, height  (1.575 m), weight 62.1 kg, last menstrual period 12/08/2010, SpO2 99 %. Physical Exam Vitals reviewed.  Constitutional:      General: She is not in acute distress.    Appearance: She is normal weight. She is not ill-appearing.  HENT:     Head: Normocephalic and atraumatic.     Right Ear: External ear normal.     Left Ear: External ear  normal.     Nose: Nose normal.  Eyes:     General:        Right eye: No discharge.        Left eye: No discharge.     Extraocular Movements: Extraocular movements intact.  Cardiovascular:     Rate and Rhythm: Normal rate and regular rhythm.  Pulmonary:     Effort: Pulmonary effort is normal.      Breath sounds: Normal breath sounds.  Abdominal:     General: Abdomen is flat. Bowel sounds are normal. There is no distension.  Musculoskeletal:     Cervical back: Normal range of motion and neck supple.     Comments: No edema or tenderness in extremities  Skin:    General: Skin is warm and dry.  Neurological:     Mental Status: She is alert and oriented to person, place, and time.     Comments: Patient is alert in no acute distress.   Makes eye contact with examiner.   Follows simple demonstrated commands.   Provides name and age. Motor: LUE/LLE: 5/5 proximal to distal RUE/RLE: 4+/5 proximal to distal  Psychiatric:        Mood and Affect: Mood normal.        Behavior: Behavior normal.        Thought Content: Thought content normal.     Lab Results Last 24 Hours       Results for orders placed or performed during the hospital encounter of 02/22/21 (from the past 24 hour(s))  Antithrombin III     Status: None   Collection Time: 02/23/21  4:13 PM  Result Value Ref Range   AntiThromb III Func 105 75 - 120 %      Imaging Results (Last 48 hours)  MR BRAIN WO CONTRAST  Result Date: 02/23/2021 CLINICAL DATA:  Dizziness, nausea and vomiting. EXAM: MRI HEAD WITHOUT CONTRAST TECHNIQUE: Multiplanar, multiecho pulse sequences of the brain and surrounding structures were obtained without intravenous contrast. COMPARISON:  CT studies same day FINDINGS: Brain: Diffusion imaging shows a 4-5 cm region of acute infarction within the superior cerebellum on the right. Acute infarction also within the dorsal right midbrain. Old infarction within the left superior cerebellum. Mild swelling but no acute hemorrhage in the right cerebellum. Old hemosiderin residual within the left cerebellar stroke. Cerebral hemispheres are normal. No mass, hydrocephalus or extra-axial collection. Vascular: No acute vascular finding by standard MRI. Skull and upper cervical spine: Negative Sinuses/Orbits: Mucosal  inflammatory changes of the left maxillary and sphenoid sinuses. Orbits negative. Other: None IMPRESSION: 4-5 cm region of acute infarction within the superior cerebellum on the right. Punctate acute infarction in the right dorsal midbrain. Mild swelling. No evidence of acute hemorrhage. Old infarction in the left superior cerebellum with atrophy, encephalomalacia and gliosis and hemosiderin deposition. Electronically Signed   By: Paulina Fusi M.D.   On: 02/23/2021 08:42   CT HEAD CODE STROKE WO CONTRAST  Result Date: 02/23/2021 CLINICAL DATA:  Code stroke. Initial evaluation for neuro deficit, stroke suspected. EXAM: CT HEAD WITHOUT CONTRAST TECHNIQUE: Contiguous axial images were obtained from the base of the skull through the vertex without intravenous contrast. COMPARISON:  Prior CT from 01/31/2020. FINDINGS: Brain: Streak/beam hardening artifact emanating from the left occipital calvarium noted, mildly limiting assessment. Cerebral volume within normal limits. Focal encephalomalacia at the central and left superior cerebellum, consistent with a chronic left MCA distribution infarct. Subtle evolving hypodensity noted involving the superior right cerebellum and cerebellar  vermis, concerning for an acute right SCA distribution infarct (series 3, image 12, 11). No associated hemorrhage or mass effect. No other evidence for acute large vessel territory infarct. No intracranial hemorrhage. No mass lesion, mass effect or midline shift. No hydrocephalus or extra-axial fluid collection. Vascular: No hyperdense vessel. Skull: Scalp soft tissues demonstrate no acute finding. Small focal exostosis noted at the right frontal calvarium. Calvarium intact. Sinuses/Orbits: Globes and orbital soft tissues within normal limits. Moderate left sphenoid and maxillary sinusitis. Additional scattered pneumatized secretions noted within the ethmoidal air cells. Subcentimeter osteoma noted within the right ethmoidal air cells as  well. Mastoid air cells are clear. Other: None. ASPECTS [facility]([place] Stroke Program Early CT Score) - Ganglionic level infarction (caudate, lentiform nuclei, internal capsule, insula, M1-M3 cortex): 7 - Supraganglionic infarction (M4-M6 cortex): 3 Total score (0-10 with 10 being normal): 10 IMPRESSION: 1. Evolving hypodensity involving the superior right cerebellum/cerebellar vermis, concerning for an acute right SCA distribution infarct. No intracranial hemorrhage or mass effect. 2. ASPECTS is 10. 3. Underlying chronic left SCA distribution infarcts. Critical Value/emergent results were called by telephone at the time of interpretation on 02/23/2021 at 12:55 a.m. to provider Dr. Amada JupiterKirkpatrick, Who verbally acknowledged these results. Electronically Signed   By: Rise MuBenjamin  McClintock M.D.   On: 02/23/2021 01:23   CT ANGIO HEAD NECK W WO CM (CODE STROKE)  Result Date: 02/23/2021 CLINICAL DATA:  Initial evaluation for neuro deficit, stroke suspected. EXAM: CT ANGIOGRAPHY HEAD AND NECK TECHNIQUE: Multidetector CT imaging of the head and neck was performed using the standard protocol during bolus administration of intravenous contrast. Multiplanar CT image reconstructions and MIPs were obtained to evaluate the vascular anatomy. Carotid stenosis measurements (when applicable) are obtained utilizing NASCET criteria, using the distal internal carotid diameter as the denominator. CONTRAST:  75mL OMNIPAQUE IOHEXOL 350 MG/ML SOLN COMPARISON:  Prior head CT from earlier the same day. FINDINGS: CTA NECK FINDINGS Aortic arch: Visualized aortic arch normal in caliber with normal branch pattern. No stenosis about the origin of the great vessels. Right carotid system: Right common carotid artery patent from its origin to the bifurcation without stenosis. Minimal plaque about the right carotid bulb without stenosis. Right ICA patent distally without stenosis, dissection or occlusion. Left carotid system: Left CCA patent from its  origin to the bifurcation without stenosis. Minimal plaque about the left carotid bulb without significant stenosis. Left ICA patent distally without stenosis, dissection or occlusion. Vertebral arteries: Left vertebral artery arises directly from the aortic arch. Right vertebral artery dominant. Vertebral arteries widely patent within the neck. Just prior to the skull base/dural reflection, there is distal right V3 segment (series 9, image 83), suspicious for a possible short-segment dissection. Small amount of probable intraluminal thrombus protrudes into the vascular lumen at this level. No frank raised dissection flap is seen. No more than mild stenosis at this level. The right vertebral artery is relatively normal just distally as it crosses into the cranial vault. Skeleton: No acute osseous finding. No discrete or worrisome osseous lesions. Mild cervical spondylosis noted at C5-6 without significant stenosis. Other neck: No other acute soft tissue abnormality within the neck. No mass or adenopathy. Upper chest: Visualized upper chest demonstrates no acute finding. Review of the MIP images confirms the above findings CTA HEAD FINDINGS Anterior circulation: Petrous, cavernous, and supraclinoid segments patent without stenosis or other abnormality. A1 segments widely patent. Normal anterior communicating artery complex. Anterior cerebral arteries patent to their distal aspects without stenosis. No M1 stenosis or occlusion.  Normal MCA bifurcations. Distal MCA branches well perfused and symmetric. Posterior circulation: Both V4 segments patent to the vertebrobasilar junction without stenosis. Both PICA origins patent and normal. Basilar somewhat diminutive but patent to its distal aspect without stenosis. Left superior cerebral artery patent. There is occlusion of the mid-distal right superior cerebellar artery, likely embolic (series 8, image 105). Left PCA supplied via the basilar as well as a prominent left  posterior communicating artery. Predominant fetal type origin of the right PCA. Both PCAs remain widely patent to their distal aspects. Venous sinuses: Grossly patent allowing for timing the contrast bolus. Anatomic variants: Predominant fetal type origin of the right PCA. Review of the MIP images confirms the above findings IMPRESSION: 1. Focal intimal irregularity involving the distal right V3 segment, likely reflecting a short-segment dissection. No frank raised dissection flap or significant luminal narrowing. 2. 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 3. Mild for age atheromatous disease elsewhere about the major arterial vasculature of the head and neck. No other hemodynamically significant or correctable stenosis. These results were communicated to Dr. Amada Jupiter at 1:22 amon 5/10/2022by text page via the [facility] messaging system. Electronically Signed   By: Rise Mu M.D.   On: 02/23/2021 01:38     Assessment/Plan: Diagnosis: Superior cerebellum infarct with punctate acute infarct in the right dorsal midbrain.   Stroke: Continue secondary stroke prophylaxis and Risk Factor Modification listed below:   Antiplatelet therapy:   Blood Pressure Management:  Continue current medication with prn's with permisive HTN per primary team2 Statin Agent:   Prediabetes management:   Right sided hemiparesis:  PT/OT for mobility, ADL training  Motor recovery: Fluoxetine Labs independently reviewed.  Records reviewed and summated above.  1. Does the need for close, 24 hr/day medical supervision in concert with the patient's rehab needs make it unreasonable for this patient to be served in a less intensive setting? Yes  2. Co-Morbidities requiring supervision/potential complications: hx of CVA, HTN (monitor and provide prns in accordance with increased physical exertion and pain), hyperlipidemia, prediabetes (Monitor in accordance with exercise and  adjust meds as necessary), hyponatremia (repeat labs, consider intervention if necessary) 3. Due to safety, disease management and patient education, does the patient require 24 hr/day rehab nursing? Yes 4. Does the patient require coordinated care of a physician, rehab nurse, therapy disciplines of PT/OT to address physical and functional deficits in the context of the above medical diagnosis(es)? Yes Addressing deficits in the following areas: balance, endurance, locomotion, strength, transferring, bathing, dressing, toileting and psychosocial support 5. Can the patient actively participate in an intensive therapy program of at least 3 hrs of therapy per day at least 5 days per week? Yes 6. The potential for patient to make measurable gains while on inpatient rehab is excellent 7. Anticipated functional outcomes upon discharge from inpatient rehab are modified independent and supervision  with PT, modified independent and supervision with OT, n/a with SLP. 8. Estimated rehab length of stay to reach the above functional goals is: 9-13 days. 9. Anticipated discharge destination: Home 10. Overall Rehab/Functional Prognosis: good  RECOMMENDATIONS: This patient's condition is appropriate for continued rehabilitative care in the following setting: CIR Patient has agreed to participate in recommended program. Yes Note that insurance prior authorization may be required for reimbursement for recommended care.  Comment: Rehab Admissions Coordinator to follow up.  I have personally performed a face to face diagnostic evaluation, including, but not limited to relevant history and physical exam findings, of  this patient and developed relevant assessment and plan.  Additionally, I have reviewed and concur with the physician assistant's documentation above.   Maryla Morrow, MD, ABPMR Charlton Amor, PA-C 02/24/2021          Revision History                        Routing History               Note Details  Author Allena Katz, Maryln Gottron, MD File Time 02/24/2021 1:41 PM  Author Type Physician Status Signed  Last Editor Marcello Fennel, MD Service Physical Medicine and Rehabilitation
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
## 2021-02-26 NOTE — H&P (Signed)
Physical Medicine and Rehabilitation Admission H&P    Chief Complaint  Patient presents with  . Dizziness  . Nausea  . Emesis  . 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  History taken from chart review and patient. Patient lives with spouse independent prior to admission.  Two-level home bed and bath upstairs.  She has a sister with good support.  She presented on 02/22/2021 with vertigo, acute onset as well as nausea and vomiting.  CT of the head showed evolving hypodensity involving the superior right cerebellum/cerebellar vermis, concerning for acute right SCA distribution infarction.  No intracranial hemorrhage or mass-effect.  CT angiogram of the head showed focal intimal irregularity involving the distal right V3 segment likely reflecting a short segment dissection.  Downstream occlusion of the mid distal right superior cerebellar artery likely embolic.  MRI of the brain showed a 4-5 cm region of acute infarction within the superior cerebellum on the right. Punctate acute infarct in the right dorsal midbrain with mild swelling.  Old infarction in the left superior cerebellum with atrophy/encephalomalacia.  Patient did not receive tPA.  Echocardiogram with EF of 60-65%, grade 1 diastolic dysfunction.  Admission chemistries unremarkable except potassium 2.8, glucose 120, urine drug screen negative alcohol negative patient currently maintained on low-dose aspirin as well as Brilinta for CVA prophylaxis x 4 weeks then Brilinta alone.  Subcutaneous Lovenox for DVT prophylaxis.  Tolerating a regular consistency diet.  Therapy evaluations completed due to patient's vertigo and decreased functional mobility was admitted for a comprehensive rehab program. Please see preadmission assessment from earlier today.   Review of Systems  Constitutional: Negative for  chills and fever.  HENT: Negative for hearing loss.   Eyes: Positive for double vision.  Respiratory: Negative for cough and shortness of breath.   Cardiovascular: Negative for palpitations and leg swelling.  Gastrointestinal: Positive for nausea and vomiting. Negative for heartburn.  Genitourinary: Negative for dysuria, flank pain and hematuria.  Skin: Negative for rash.  Neurological: Positive for dizziness and weakness.       Vertigo  All other systems reviewed and are negative.  Past Medical History:  Diagnosis Date  . Disturbance of skin sensation   . Myalgia and myositis, unspecified   . Other B-complex deficiencies   . 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 Stroke (HCC)   . Unspecified essential hypertension    Past Surgical History:  Procedure Laterality Date  . BUBBLE STUDY  03/05/2020   Procedure: BUBBLE STUDY;  Surgeon: [name], MD;  Location: [facility] ENDOSCOPY;  Service: Cardiovascular;;  . CESAREAN SECTION  1992  . LOOP RECORDER INSERTION N/A 03/05/2020   Procedure: LOOP RECORDER INSERTION;  Surgeon: [name], MD;  Location: [facility] INVASIVE CV LAB;  Service: Cardiovascular;  Laterality: N/A;  . TEE WITHOUT CARDIOVERSION N/A 03/05/2020   Procedure: TRANSESOPHAGEAL ECHOCARDIOGRAM (TEE);  Surgeon: [name], MD;  Location: [facility] ENDOSCOPY;  Service: Cardiovascular;  Laterality: N/A;   Family History  Problem Relation Age of Onset  . Pulmonary embolism Mother   . Hepatitis Father   . Diabetes Father   . Diabetes Mellitus II Sister   . Breast cancer Neg Hx    Social History:  reports that she has never smoked. She has never used smokeless tobacco. She reports that she does not drink alcohol and does not use drugs. Allergies: No  Known Allergies Medications Prior to Admission  Medication Sig Dispense Refill  . amLODipine (NORVASC) 5 MG tablet Take 1 tablet (5 mg total) by mouth daily. 90 tablet 3  . aspirin 81 MG EC tablet Take 1  tablet (81 mg total) by mouth daily. 90 tablet 1  . atorvastatin (LIPITOR) 40 MG tablet Take 1 tablet (40 mg total) by mouth daily. 90 tablet 3  . Calcium-Magnesium-Vitamin D (CALCIUM MAGNESIUM PO) Take by mouth daily.    Marland Kitchen losartan (COZAAR) 50 MG tablet Take 1 tablet (50 mg total) by mouth daily. 90 tablet 3  . Multiple Minerals-Vitamins (MULTISOURCE CALCIUM MAG/D) TABS Take 1 tablet by mouth daily.    . Multiple Vitamin (MULTIVITAMIN WITH MINERALS) TABS tablet Take 1 tablet by mouth daily.      Drug Regimen Review Drug regimen was reviewed and remains appropriate with no significant issues identified  Home: Home Living Family/patient expects to be discharged to:: Private residence Living Arrangements: Spouse/significant other Available Help at Discharge: Family,Available 24 hours/day Type of Home: House Home Access: Stairs to enter [employer] of Steps: 4 Entrance Stairs-Rails: Right,Left Home Layout: Two level,Bed/bath upstairs Alternate Level Stairs-Rails: Left Bathroom Shower/Tub: Health visitor: Standard Bathroom Accessibility: Yes Home Equipment: Tub bench,Shower seat,Walker - 2 wheels   Functional History: Prior Function Level of Independence: Independent Comments: Independent with meds, ADL, IADL.  Spouse owns a [employer] in town  Functional Status:  Mobility: Bed Mobility Overal bed mobility: Modified Independent Bed Mobility: Sit to Supine Supine to sit: Modified independent (Device/Increase time) Sit to supine: Modified independent (Device/Increase time) General bed mobility comments: increased time and effort, no physical assist needed Transfers Overall transfer level: Needs assistance Equipment used: Rolling walker (2 wheeled) Transfers: Sit to/from Stand Sit to Stand: Min assist,Min guard Stand pivot transfers: Min assist General transfer comment: minA to rise and steady with HHAx1. Truncal ataxia noted upon standing.  Cues for slow controlled movement, min guard to rise from toilet with cues to use grab bar Ambulation/Gait Ambulation/Gait assistance: Min assist Gait Distance (Feet): 120 Feet Assistive device: Rolling walker (2 wheeled) Gait Pattern/deviations: Step-through pattern,Narrow base of support,Decreased stride length,Ataxic General Gait Details: Patient requires min assist with ambulation. She is limited mostly by trunkal instability. Tends to lift RW from side to side during ambulation. With increased distance, patient has increased fatigue and decreased safety. Gait velocity: WNL, starts out with quick pace and requires cues to slow down for safety    ADL: ADL Overall ADL's : Needs assistance/impaired Eating/Feeding: Set up,Sitting Grooming: Wash/dry face,Brushing hair,Sitting,Standing,Min guard,Supervision/safety,Set up Grooming Details (indicate cue type and reason): set- up to brush her hair from EOB, min guard assist for standing grooming at sink Upper Body Bathing: Supervision/ safety,Sitting Lower Body Bathing: Minimal assistance,Sit to/from stand Upper Body Dressing : Supervision/safety,Sitting Lower Body Dressing: Supervision/safety,Sitting/lateral leans Lower Body Dressing Details (indicate cue type and reason): to slide on shoes from EOB Toilet Transfer: RW,Ambulation,Cueing for safety,Regular Toilet,Minimal assistance Toilet Transfer Details (indicate cue type and reason): MINA for functional mobility from EOB > toilet, MINA to manage RW as pt present with ataxic gait, cues for safety to reach back to toilet and use grab bars as needed Toileting- Clothing Manipulation and Hygiene: Supervision/safety,Sitting/lateral lean Functional mobility during ADLs: Minimal assistance,Cueing for safety,Rolling walker General ADL Comments: pt continues to present with ataxic gait needing MIN A to stabilize RW during functiona lmobility, pt reports improved vision and noted to be able to complete  standing ADLS with min  guard assist  Cognition: Cognition Overall Cognitive Status: Within Functional Limits for tasks assessed Orientation Level: Oriented X4 Cognition Arousal/Alertness: Awake/alert Behavior During Therapy: WFL for tasks assessed/performed Overall Cognitive Status: Within Functional Limits for tasks assessed Area of Impairment: Safety/judgement,Problem solving Safety/Judgement: Decreased awareness of safety Problem Solving: Requires verbal cues,Difficulty sequencing General Comments: Requires cues to push up from and reach back for seated surfaces, but she does not  Physical Exam: Blood pressure (!) 146/80, pulse 75, temperature 98.3 F (36.8 C), temperature source Oral, resp. rate 18, height 5\' 2"  (1.575 m), weight 62.1 kg, last menstrual period 12/08/2010, SpO2 99 %. Physical Exam Constitutional:      General: She is not in acute distress.    Appearance: She is normal weight. She is not ill-appearing.  HENT:     Head: Normocephalic and atraumatic.     Right Ear: External ear normal.     Left Ear: External ear normal.     Nose: Nose normal.     Mouth/Throat:     Mouth: Mucous membranes are dry.     Pharynx: Oropharynx is clear.  Eyes:     Extraocular Movements: Extraocular movements intact.  Cardiovascular:     Rate and Rhythm: Normal rate and regular rhythm.  Pulmonary:     Effort: Pulmonary effort is normal. No respiratory distress.     Breath sounds: No stridor.  Abdominal:     General: Abdomen is flat. Bowel sounds are normal. There is no distension.  Musculoskeletal:     Cervical back: Normal range of motion and neck supple.     Comments: No edema or tenderness in extremities  Skin:    General: Skin is warm and dry.  Neurological:     Mental Status: She is alert and oriented to person, place, and time.     Comments: Patient is alert no acute distress.   Makes eye contact with examiner.   Provides name and age.   Follows demonstrated  commands. Motor: LUE/LLE: 5/5 proximal to distal RUE/RLE: 4+/5 proximal to distal   Psychiatric:        Mood and Affect: Mood normal.        Behavior: Behavior normal.        Thought Content: Thought content normal.     Results for orders placed or performed during the hospital encounter of 02/22/21 (from the past 48 hour(s))  CBC     Status: Abnormal   Collection Time: 02/25/21  4:09 AM  Result Value Ref Range   WBC 6.4 4.0 - 10.5 K/uL   RBC 3.91 3.87 - 5.11 MIL/uL   Hemoglobin 11.3 (L) 12.0 - 15.0 g/dL   HCT 04/27/21 (L) 62.8 - 31.5 %   MCV 86.2 80.0 - 100.0 fL   MCH 28.9 26.0 - 34.0 pg   MCHC 33.5 30.0 - 36.0 g/dL   RDW 17.6 16.0 - 73.7 %   Platelets 205 150 - 400 K/uL   nRBC 0.0 0.0 - 0.2 %    Comment: Performed at [facility], [address] [place], [place]  Basic metabolic panel     Status: Abnormal   Collection Time: 02/25/21  4:09 AM  Result Value Ref Range   Sodium 137 135 - 145 mmol/L   Potassium 3.1 (L) 3.5 - 5.1 mmol/L   Chloride 106 98 - 111 mmol/L   CO2 26 22 - 32 mmol/L   Glucose, Bld 118 (H) 70 - 99 mg/dL    Comment: Glucose  reference range applies only to samples taken after fasting for at least 8 hours.   BUN 12 6 - 20 mg/dL   Creatinine, Ser 4.27 0.44 - 1.00 mg/dL   Calcium 8.6 (L) 8.9 - 10.3 mg/dL   GFR, Estimated >06 >23 mL/min    Comment: (NOTE) Calculated using the CKD-EPI Creatinine Equation (2021)    Anion gap 5 5 - 15    Comment: Performed at [facility], [address] [place], [place] [address]  Magnesium     Status: None   Collection Time: 02/25/21  4:09 AM  Result Value Ref Range   Magnesium 2.0 1.7 - 2.4 mg/dL    Comment: Performed at [facility], [address] [place], [place] [address]  Urinalysis, Routine w reflex microscopic Urine, Clean Catch     Status: Abnormal   Collection Time: 02/25/21  1:37 PM  Result Value Ref Range   Color, Urine YELLOW YELLOW   APPearance HAZY (A) CLEAR   Specific  Gravity, Urine 1.012 1.005 - 1.030   pH 7.0 5.0 - 8.0   Glucose, UA NEGATIVE NEGATIVE mg/dL   Hgb urine dipstick LARGE (A) NEGATIVE   Bilirubin Urine NEGATIVE NEGATIVE   Ketones, ur NEGATIVE NEGATIVE mg/dL   Protein, ur NEGATIVE NEGATIVE mg/dL   Nitrite NEGATIVE NEGATIVE   Leukocytes,Ua NEGATIVE NEGATIVE   RBC / HPF >50 (H) 0 - 5 RBC/hpf   WBC, UA 0-5 0 - 5 WBC/hpf   Bacteria, UA RARE (A) NONE SEEN   Squamous Epithelial / LPF 0-5 0 - 5   Mucus PRESENT     Comment: Performed at [facility], [address] [place], [place] [address]  Basic metabolic panel     Status: Abnormal   Collection Time: 02/26/21  1:32 AM  Result Value Ref Range   Sodium 137 135 - 145 mmol/L   Potassium 4.3 3.5 - 5.1 mmol/L   Chloride 105 98 - 111 mmol/L   CO2 27 22 - 32 mmol/L   Glucose, Bld 118 (H) 70 - 99 mg/dL    Comment: Glucose reference range applies only to samples taken after fasting for at least 8 hours.   BUN 15 6 - 20 mg/dL   Creatinine, Ser 7.10 0.44 - 1.00 mg/dL   Calcium 9.0 8.9 - 62.6 mg/dL   GFR, Estimated >94 >85 mL/min    Comment: (NOTE) Calculated using the CKD-EPI Creatinine Equation (2021)    Anion gap 5 5 - 15    Comment: Performed at [facility], [address], [place], [place] [address]   No results found.     Medical Problem List and Plan: 1.  Vertigo nausea vomiting secondary to superior cerebellum infarct with punctate acute infarct in the right dorsal midbrain as well as history of CVA 2019/loop recorder  -patient may shower  -ELOS/Goals: Supervision/Mod I/8-13 days.  Admit to CIR 2.  Antithrombotics: -DVT/anticoagulation: Lovenox  -antiplatelet therapy: Aspirin 81 mg daily and Brilinta 90 mg twice daily x4 weeks then Brilinta alone 3. Pain Management: Tylenol as needed  Monitor, particularly for headaches with increased exertion 4. Mood: Provide emotional support  -antipsychotic agents: N/A 5. Neuropsych: This patient is capable of making decisions  on her own behalf. 6. Skin/Wound Care: Routine skin checks 7. Fluids/Electrolytes/Nutrition: Routine in and outs  CMP ordered 8.  Permissive hypertension.  Patient on Cozaar50 mg daily, Norvasc 5 mg daily prior to admission.    Resume as necessary  9. Hyperlipidemia: Lipitor 10. ABLA  CBC ordered  Charlton Amor, PA-C 02/26/2021  I have personally performed a face to face diagnostic evaluation, including, but not limited to relevant history and physical exam findings, of this patient and developed relevant assessment and plan.  Additionally, I have reviewed and concur with the physician assistant's documentation above.  Maryla Morrow, MD, ABPMR  The patient's status has not changed. Any changes from the pre-admission screening or documentation from the acute chart are noted above.   Maryla Morrow, MD, ABPMR

## 2021-02-26 NOTE — Progress Notes (Signed)
Inpatient Rehab Admissions Coordinator:    I have a bed available for this patient to admit to CIR today.  Dr. Rito Ehrlich in agreement. I will let pt/family and TOC team know.    Estill Dooms, PT, DPT Admissions Coordinator [phone] 02/26/21  12:28 PM

## 2021-02-26 NOTE — Discharge Summary (Signed)
[facility]  Physician Discharge Summary   Patient ID: Judy Gutierrez MRN: [id number] DOB/AGE: [birth year] 60 y.o.  Admit date: 02/22/2021 Discharge date: 02/26/2021  PCP: Donato SchultzLowne Chase, Yvonne R, DO  DISCHARGE DIAGNOSES:  Acute right cerebellar stroke Microscopic hematuria which may require further evaluation in the outpatient setting Essential hypertension Hyperlipidemia   RECOMMENDATIONS FOR OUTPATIENT FOLLOW UP: 1. Patient being discharged/transferred to inpatient rehabilitation 2. 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 Pearlean BrownieSethi, underwent extensive evaluation including negative TEE on 03/05/2020 and loop recorder insertion, was on DAPT for 3 weeks followed by aspirin alone; hypertension, hyperlipidemia [facility] with similar complaints as her prior stroke including dizziness, vertigo, nausea and vomiting.  LKW 9:30 PM on 5/9.  Code stroke activated.  Seen by neurology.  Admitted for acute right cerebellar stroke with concern for dissection on CTA, tPA deferred, treating with DAPT.   Consultations:  Neurology  Procedures:  None    HOSPITAL COURSE:   Acute right cerebellar stroke with concern for dissection on CTA, recurrent stroke  CT head code stroke: Evolving hypodensity involving the superior right cerebellum/cerebellar vermis, concerning for an acute right ACA distribution infarct.  No intracranial hemorrhage or mass-effect.  Underlying chronic left ACA distribution infarct.  CTA head and neck: Focal intimal irregularity involving the distal right V3 segment, likely reflecting a  short segment dissection.  No frank wrist dissection flap or significant luminal narrowing.  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  No other hemodynamically significant or correctable stenosis.  MRI brain: 4-5 cm region of acute infarction within the superior cerebellum on the right.  Punctate acute infarction in the right dorsal midbrain.  Mild swelling.  No evidence of acute hemorrhage.  Old infarction in the left superior cerebellum with atrophy, encephalomalacia and gliosis and hemosiderin deposition.  Stroke team did not recommend tPA due to mild symptoms, being in the extended time window and concern for bleeding.  Was on aspirin 81 mg daily PTA.  Neurology recommends aspirin and Brilinta followed by Brilinta alone.  LDL 51.  A1c 6.  Continue statin.  PT/OT/speech therapist assessment are pending.  Echocardiogram has been completed.  Discussed with cardiology.  Unfortunately results not crossing over to the appropriate section in the EMR.  Systolic function was noted to be normal.  No source of stroke was identified.  No significant valvular disease noted.  Hypercoagulable panel was ordered by neurology.  So far work-up has been negative.  Telemetry monitoring shows SR. Loop recorder interrogated on 5/10 and was reported as no A. Fib.  Patient continues to have vision abnormalities and unsteady gait.  No new complaints offered.  Urinary frequency and discomfort/microscopic hematuria Patient reports some difficulty passing urine.  UA was checked which does not show any evidence for infection.  Microscopic hematuria was noted.  This may need evaluation in the outpatient setting. No flank pain. Discussed with patient and was advised to have UA rechecked in outpatient setting. And to seek attention immediately if gross blood is noted.  Essential hypertension  Allowing permissive hypertension.  Can resume her antihypertensives on  5/15.  Hyperlipidemia  LDL 51.  Patient  noted to be on atorvastatin.  To continue with home regimen as recommended by stroke team.  LFTs were unremarkable.    Leukocytosis  Suspect reactive in the setting of acute stroke.  Now normal.  Nausea and vomiting likely from acute cerebellar stroke  Seems to have improved.  Hypokalemia  Repleted   Patient is stable.  Okay for discharge to inpatient rehabilitation today.   PERTINENT LABS:  The results of significant diagnostics from this hospitalization (including imaging, microbiology, ancillary and laboratory) are listed below for reference.    Microbiology: Recent Results (from the past 240 hour(s))  Resp Panel by RT-PCR (Flu A&B, Covid) Nasopharyngeal Swab     Status: None   Collection Time: 02/23/21  1:22 AM   Specimen: Nasopharyngeal Swab; Nasopharyngeal(NP) swabs in vial transport medium  Result Value Ref Range Status   SARS Coronavirus 2 by RT PCR NEGATIVE NEGATIVE Final    Comment: (NOTE) SARS-CoV-2 target nucleic acids are NOT DETECTED.  The SARS-CoV-2 RNA is generally detectable in upper respiratory specimens during the acute phase of infection. The lowest concentration of SARS-CoV-2 viral copies this assay can detect is 138 copies/mL. A negative result does not preclude SARS-Cov-2 infection and should not be used as the sole basis for treatment or other patient management decisions. A negative result may occur with  improper specimen collection/handling, submission of specimen other than nasopharyngeal swab, presence of viral mutation(s) within the areas targeted by this assay, and inadequate number of viral copies(<138 copies/mL). A negative result must be combined with clinical observations, patient history, and epidemiological information. The expected result is Negative.  Fact Sheet for Patients:  BloggerCourse.com  Fact Sheet for Healthcare Providers:   SeriousBroker.it  This test is no t yet approved or cleared by the [place] FDA and  has been authorized for detection and/or diagnosis of SARS-CoV-2 by FDA under an Emergency Use Authorization (EUA). This EUA will remain  in effect (meaning this test can be used) for the duration of the COVID-19 declaration under Section 564(b)(1) of the Act, 21 U.S.C.section 360bbb-3(b)(1), unless the authorization is terminated  or revoked sooner.       Influenza A by PCR NEGATIVE NEGATIVE Final   Influenza B by PCR NEGATIVE NEGATIVE Final    Comment: (NOTE) The Xpert Xpress SARS-CoV-2/FLU/RSV plus assay is intended as an aid in the diagnosis of influenza from Nasopharyngeal swab specimens and should not be used as a sole basis for treatment. Nasal washings and aspirates are unacceptable for Xpert Xpress SARS-CoV-2/FLU/RSV testing.  Fact Sheet for Patients: BloggerCourse.com  Fact Sheet for Healthcare Providers: SeriousBroker.it  This test is not yet approved or cleared by the [place] FDA and has been authorized for detection and/or diagnosis of SARS-CoV-2 by FDA under an Emergency Use Authorization (EUA). This EUA will remain in effect (meaning this test can be used) for the duration of the COVID-19 declaration under Section 564(b)(1) of the Act, 21 U.S.C. section 360bbb-3(b)(1), unless the authorization is terminated or revoked.  Performed at [facility], [address], [place], [place] [address]      Labs:  COVID-19 Labs   Lab Results  Component Value Date   SARSCOV2NAA NEGATIVE 02/23/2021   SARSCOV2NAA NEGATIVE 03/03/2020   SARSCOV2NAA NEGATIVE 01/30/2020      Basic Metabolic Panel: Recent Labs  Lab 02/22/21 2257 02/23/21 0045 02/23/21 0745 02/25/21 0409 02/26/21 0132  NA 136 139 134* 137 137  K 2.8* 3.9 3.8 3.1* 4.3  CL 106 104 105  106 105  CO2 24  --  GLUCOSE 120* 139* 123* 118* 118*  BUN CREATININE 0.53 0.40* 0.54 0.63 0.61  CALCIUM 8.7*  --  8.4* 8.6* 9.0  MG  --   --  1.9 2.0  --   PHOS  --   --  3.6  --   --    Liver Function Tests: Recent Labs  Lab 02/23/21 0901  AST 43*  ALT 37  ALKPHOS 86  BILITOT 0.6  PROT 7.8  ALBUMIN 3.8   CBC: Recent Labs  Lab 02/22/21 2257 02/23/21 0045 02/23/21 0901 02/25/21 0409  WBC 11.2*  --  6.9 6.4  NEUTROABS 4.2  --   --   --   HGB 12.3 12.6 12.6 11.3*  HCT 37.1 37.0 38.5 33.7*  MCV 87.5  --  86.9 86.2  PLT 263  --  238 205    CBG: Recent Labs  Lab 02/23/21 0259  GLUCAP 126*     IMAGING STUDIES MR BRAIN WO CONTRAST  Result Date: 02/23/2021 CLINICAL DATA:  Dizziness, nausea and vomiting. EXAM: MRI HEAD WITHOUT CONTRAST TECHNIQUE: Multiplanar, multiecho pulse sequences of the brain and surrounding structures were obtained without intravenous contrast. COMPARISON:  CT studies same day FINDINGS: Brain: Diffusion imaging shows a 4-5 cm region of acute infarction within the superior cerebellum on the right. Acute infarction also within the dorsal right midbrain. Old infarction within the left superior cerebellum. Mild swelling but no acute hemorrhage in the right cerebellum. Old hemosiderin residual within the left cerebellar stroke. Cerebral hemispheres are normal. No mass, hydrocephalus or extra-axial collection. Vascular: No acute vascular finding by standard MRI. Skull and upper cervical spine: Negative Sinuses/Orbits: Mucosal inflammatory changes of the left maxillary and sphenoid sinuses. Orbits negative. Other: None IMPRESSION: 4-5 cm region of acute infarction within the superior cerebellum on the right. Punctate acute infarction in the right dorsal midbrain. Mild swelling. No evidence of acute hemorrhage. Old infarction in the left superior cerebellum with atrophy, encephalomalacia and gliosis and hemosiderin deposition. Electronically Signed   By: Paulina Fusi  M.D.   On: 02/23/2021 08:42   CUP PACEART REMOTE DEVICE CHECK  Result Date: 02/09/2021 ILR summary report received. Battery status OK. Normal device function. No new symptom, tachy, brady, or pause episodes. No new AF episodes. Monthly summary reports and ROV/PRN.  RP  CT HEAD CODE STROKE WO CONTRAST  Result Date: 02/23/2021 CLINICAL DATA:  Code stroke. Initial evaluation for neuro deficit, stroke suspected. EXAM: CT HEAD WITHOUT CONTRAST TECHNIQUE: Contiguous axial images were obtained from the base of the skull through the vertex without intravenous contrast. COMPARISON:  Prior CT from 01/31/2020. FINDINGS: Brain: Streak/beam hardening artifact emanating from the left occipital calvarium noted, mildly limiting assessment. Cerebral volume within normal limits. Focal encephalomalacia at the central and left superior cerebellum, consistent with a chronic left MCA distribution infarct. Subtle evolving hypodensity noted involving the superior right cerebellum and cerebellar vermis, concerning for an acute right SCA distribution infarct (series 3, image 12, 11). No associated hemorrhage or mass effect. No other evidence for acute large vessel territory infarct. No intracranial hemorrhage. No mass lesion, mass effect or midline shift. No hydrocephalus or extra-axial fluid collection. Vascular: No hyperdense vessel. Skull: Scalp soft tissues demonstrate no acute finding. Small focal exostosis noted at the right frontal calvarium. Calvarium intact. Sinuses/Orbits: Globes and orbital soft tissues within normal limits. Moderate left sphenoid and maxillary sinusitis. Additional scattered pneumatized  secretions noted within the ethmoidal air cells. Subcentimeter osteoma noted within the right ethmoidal air cells as well. Mastoid air cells are clear. Other: None. ASPECTS [facility] Stroke Program Early CT Score) - Ganglionic level infarction (caudate, lentiform nuclei, internal capsule, insula, M1-M3 cortex): 7 -  Supraganglionic infarction (M4-M6 cortex): 3 Total score (0-10 with 10 being normal): 10 IMPRESSION: 1. Evolving hypodensity involving the superior right cerebellum/cerebellar vermis, concerning for an acute right SCA distribution infarct. No intracranial hemorrhage or mass effect. 2. ASPECTS is 10. 3. Underlying chronic left SCA distribution infarcts. Critical Value/emergent results were called by telephone at the time of interpretation on 02/23/2021 at 12:55 a.m. to provider Dr. Amada Jupiter, Who verbally acknowledged these results. Electronically Signed   By: Rise Mu M.D.   On: 02/23/2021 01:23   CT ANGIO HEAD NECK W WO CM (CODE STROKE)  Result Date: 02/23/2021 CLINICAL DATA:  Initial evaluation for neuro deficit, stroke suspected. EXAM: CT ANGIOGRAPHY HEAD AND NECK TECHNIQUE: Multidetector CT imaging of the head and neck was performed using the standard protocol during bolus administration of intravenous contrast. Multiplanar CT image reconstructions and MIPs were obtained to evaluate the vascular anatomy. Carotid stenosis measurements (when applicable) are obtained utilizing NASCET criteria, using the distal internal carotid diameter as the denominator. CONTRAST:  90mL OMNIPAQUE IOHEXOL 350 MG/ML SOLN COMPARISON:  Prior head CT from earlier the same day. FINDINGS: CTA NECK FINDINGS Aortic arch: Visualized aortic arch normal in caliber with normal branch pattern. No stenosis about the origin of the great vessels. Right carotid system: Right common carotid artery patent from its origin to the bifurcation without stenosis. Minimal plaque about the right carotid bulb without stenosis. Right ICA patent distally without stenosis, dissection or occlusion. Left carotid system: Left CCA patent from its origin to the bifurcation without stenosis. Minimal plaque about the left carotid bulb without significant stenosis. Left ICA patent distally without stenosis, dissection or occlusion. Vertebral arteries:  Left vertebral artery arises directly from the aortic arch. Right vertebral artery dominant. Vertebral arteries widely patent within the neck. Just prior to the skull base/dural reflection, there is distal right V3 segment (series 9, image 83), suspicious for a possible short-segment dissection. Small amount of probable intraluminal thrombus protrudes into the vascular lumen at this level. No frank raised dissection flap is seen. No more than mild stenosis at this level. The right vertebral artery is relatively normal just distally as it crosses into the cranial vault. Skeleton: No acute osseous finding. No discrete or worrisome osseous lesions. Mild cervical spondylosis noted at C5-6 without significant stenosis. Other neck: No other acute soft tissue abnormality within the neck. No mass or adenopathy. Upper chest: Visualized upper chest demonstrates no acute finding. Review of the MIP images confirms the above findings CTA HEAD FINDINGS Anterior circulation: Petrous, cavernous, and supraclinoid segments patent without stenosis or other abnormality. A1 segments widely patent. Normal anterior communicating artery complex. Anterior cerebral arteries patent to their distal aspects without stenosis. No M1 stenosis or occlusion. Normal MCA bifurcations. Distal MCA branches well perfused and symmetric. Posterior circulation: Both V4 segments patent to the vertebrobasilar junction without stenosis. Both PICA origins patent and normal. Basilar somewhat diminutive but patent to its distal aspect without stenosis. Left superior cerebral artery patent. There is occlusion of the mid-distal right superior cerebellar artery, likely embolic (series 8, image 105). Left PCA supplied via the basilar as well as a prominent left posterior communicating artery. Predominant fetal type origin of the right PCA. Both PCAs remain widely  patent to their distal aspects. Venous sinuses: Grossly patent allowing for timing the contrast bolus.  Anatomic variants: Predominant fetal type origin of the right PCA. Review of the MIP images confirms the above findings IMPRESSION: 1. Focal intimal irregularity involving the distal right V3 segment, likely reflecting a short-segment dissection. No frank raised dissection flap or significant luminal narrowing. 2. 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 3. Mild for age atheromatous disease elsewhere about the major arterial vasculature of the head and neck. No other hemodynamically significant or correctable stenosis. These results were communicated to Dr. Amada Jupiter at 1:22 amon 5/10/2022by text page via the [facility] messaging system. Electronically Signed   By: Rise Mu M.D.   On: 02/23/2021 01:38    DISCHARGE EXAMINATION: Vitals:   02/25/21 2351 02/26/21 0354 02/26/21 0816 02/26/21 1157  BP: (!) 141/77 122/67 (!) 143/81 (!) 146/80  Pulse: 75 84 77 75  Resp: 18 18 18 18   Temp: 98.9 F (37.2 C) 98.9 F (37.2 C) 98.5 F (36.9 C) 98.3 F (36.8 C)  TempSrc: Oral Oral Oral Oral  SpO2: 100% 98% 98% 99%  Weight:      Height:       General appearance: Awake alert.  In no distress Resp: Clear to auscultation bilaterally.  Normal effort Cardio: S1-S2 is normal regular.  No S3-S4.  No rubs murmurs or bruit GI: Abdomen is soft.  Nontender nondistended.  Bowel sounds are present normal.  No masses organomegaly    DISPOSITION: CIR     Current Inpatient Medications:  Scheduled: . aspirin EC  81 mg Oral Daily  . atorvastatin  80 mg Oral Daily  . enoxaparin (LOVENOX) injection  40 mg Subcutaneous Q24H  . polyethylene glycol  17 g Oral Daily  . senna-docusate  2 tablet Oral BID  . ticagrelor  90 mg Oral BID   Continuous:  , hydrALAZINE, ondansetron (ZOFRAN) IV    TOTAL DISCHARGE TIME: 35 minutes  Mckenley Birenbaum [email or web address] on [email or web address]  02/26/2021, 12:28 PM

## 2021-02-26 NOTE — Progress Notes (Signed)
Physical Therapy Treatment Patient Details Name: Judy Gutierrez MRN: [id number] DOB: 07-15-1961 Today's Date: 02/26/2021    History of Present Illness 60 yo female [facility] on 5/9 with vertigo, N/V. CTH shows evolving hypodensity involving superior R cerebellum/cerebellar vermis concerning for acute R SCA CVA. MR brain shows 4-5 cm region of acute infarction within the superior cerebellum on the right. Punctate acute infarction in the right dorsal midbrain No TPA given. PMH includes prior embolic cerebellar CVA 2021, HTN, pelvic fx 2015.    PT Comments    Patient received in reclined with friend present in room. She reports her vision seems better. Patient requires min assist for sit to stand and for ambulation in hallway using RW. She requires cues to slow pace for improved safety. Patient is at continued high fall risk due to trunk ataxia and R side weakness with mobility and will tend to pull up on rolling walker at times. Patient will continue to benefit from skilled PT while here to improve safety and balance for improved independence.      Follow Up Recommendations  CIR     Equipment Recommendations  None recommended by PT    Recommendations for Other Services       Precautions / Restrictions Precautions Precautions: Fall Precaution Comments: Diplopia, L eye- states her vision is improving Restrictions Weight Bearing Restrictions: No    Mobility  Bed Mobility Overal bed mobility: Modified Independent Bed Mobility: Sit to Supine       Sit to supine: Modified independent (Device/Increase time)   General bed mobility comments: up in recliner on arrival    Transfers Overall transfer level: Needs assistance Equipment used: Rolling walker (2 wheeled);1 person hand held assist Transfers: Sit to/from Stand Sit to Stand: Min assist Stand pivot transfers: Min assist       General transfer comment: minA to rise and steady with HHAx1. Truncal ataxia noted upon  standing. Cues for slow controlled movement  Ambulation/Gait Ambulation/Gait assistance: Min assist Gait Distance (Feet): 120 Feet Assistive device: Rolling walker (2 wheeled) Gait Pattern/deviations: Step-through pattern;Narrow base of support;Decreased stride length;Ataxic Gait velocity: WNL, starts out with quick pace and requires cues to slow down for safety   General Gait Details: Patient requires min assist with ambulation. She is limited mostly by trunkal instability. Tends to lift RW from side to side during ambulation. With increased distance, patient has increased fatigue and decreased safety.   Stairs             Wheelchair Mobility    Modified Rankin (Stroke Patients Only) Modified Rankin (Stroke Patients Only) Pre-Morbid Rankin Score: No significant disability Modified Rankin: Moderately severe disability     Balance Overall balance assessment: Needs assistance Sitting-balance support: Feet supported Sitting balance-Leahy Scale: Good     Standing balance support: Bilateral upper extremity supported;During functional activity Standing balance-Leahy Scale: Poor Standing balance comment: reliant on external assist and B UE support                            Cognition Arousal/Alertness: Awake/alert Behavior During Therapy: WFL for tasks assessed/performed Overall Cognitive Status: Within Functional Limits for tasks assessed                           Safety/Judgement: Decreased awareness of safety   Problem Solving: Requires verbal cues;Difficulty sequencing General Comments: Requires cues to push up from and reach back for seated  surfaces, but she does not      Exercises Other Exercises Other Exercises: Seated LAQ, marching on right. x 5-10 Cues for slow, controlled movements. More difficulty reported with hip flexion.    General Comments        Pertinent Vitals/Pain Pain Assessment: No/denies pain    Home Living                       Prior Function            PT Goals (current goals can now be found in the care plan section) Acute Rehab PT Goals Patient Stated Goal: return to independence PT Goal Formulation: With patient Time For Goal Achievement: 03/10/21 Potential to Achieve Goals: Good Progress towards PT goals: Progressing toward goals    Frequency    Min 4X/week      PT Plan Current plan remains appropriate    Co-evaluation              AM-PAC PT "6 Clicks" Mobility   Outcome Measure  Help needed turning from your back to your side while in a flat bed without using bedrails?: None Help needed moving from lying on your back to sitting on the side of a flat bed without using bedrails?: A Little Help needed moving to and from a bed to a chair (including a wheelchair)?: A Little Help needed standing up from a chair using your arms (e.g., wheelchair or bedside chair)?: A Little Help needed to walk in hospital room?: A Little Help needed climbing 3-5 steps with a railing? : A Lot 6 Click Score: 18    End of Session Equipment Utilized During Treatment: Gait belt Activity Tolerance: Patient tolerated treatment well Patient left: in bed;with bed alarm set;with family/visitor present Nurse Communication: Mobility status PT Visit Diagnosis: Ataxic gait (R26.0);Difficulty in walking, not elsewhere classified (R26.2)     Time: 0865-7846 PT Time Calculation (min) (ACUTE ONLY): 23 min  Charges:  $Gait Training: 8-22 mins $Therapeutic Exercise: 8-22 mins                     Helane Briceno, PT, GCS 02/26/21,12:19 PM

## 2021-02-26 NOTE — IPOC Note (Signed)
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)     Functional Problem List: Nursing Medication Management,Safety,Pain,Endurance,Skin Integrity  PT Balance,Endurance,Motor,Safety,Sensory,Skin Integrity  OT Balance,Safety,Endurance,Motor,Vision  SLP    TR         Basic ADL's: OT Bathing,Dressing,Toileting     Advanced  ADL's: OT       Transfers: PT Bed Mobility,Bed to Chair,Car,Furniture  OT Toilet,Tub/Shower     Locomotion: PT Ambulation,Stairs     Additional Impairments: OT None  SLP        TR      Anticipated Outcomes Item Anticipated Outcome  Self Feeding no goal set  Swallowing      Basic self-care  set up assist  Toileting  supervision   Bathroom Transfers supervision  Bowel/Bladder  n/a  Transfers  spv  Locomotion  spv  Communication     Cognition     Pain  pain at or below level 4  Safety/Judgment  Maintain safety with cues/reminders   Therapy Plan: PT Intensity: Minimum of 1-2 x/day ,45 to 90 minutes PT Frequency: 5 out of 7 days PT Duration Estimated Length of Stay: 10-14 OT Intensity: Minimum of 1-2 x/day, 45 to 90 minutes OT Frequency: 5 out of 7 days OT Duration/Estimated Length of Stay: 10-14 days      Team Interventions: Nursing Interventions Patient/Family Education,Skin Care/Wound Management,Discharge Planning,Medication Management,Disease Management/Prevention,Pain Management  PT interventions Ambulation/gait training,Discharge planning,Functional mobility training,Psychosocial support,Therapeutic Activities,Visual/perceptual remediation/compensation,Balance/vestibular training,Disease management/prevention,Neuromuscular re-education,Skin care/wound Environmental consultant  propulsion/positioning,Cognitive remediation/compensation,DME/adaptive equipment instruction,Pain management,Splinting/orthotics,UE/LE Strength taining/ROM,Community reintegration,Functional electrical stimulation,Patient/family education,Stair training,UE/LE Coordination activities  OT Interventions Balance/vestibular training,Discharge planning,Pain management,Self Care/advanced ADL retraining,Therapeutic Activities,UE/LE Coordination activities,Visual/perceptual remediation/compensation,Therapeutic Exercise,Patient/family education,Functional mobility training,Community reintegration,DME/adaptive equipment instruction,Psychosocial support,Neuromuscular re-education,UE/LE Strength taining/ROM  SLP Interventions    TR Interventions    SW/CM Interventions Discharge Planning,Psychosocial Support,Patient/Family Education   Barriers to Discharge MD  Medical stability  Nursing Decreased caregiver support,Home environment access/layout,Wound Care,Insurance for SNF coverage 2 level 4 STE bil rails; bathroom upstairs, spouse works  PT Curator    OT Decreased caregiver support reports her husband works all day  SLP      SW       Team Discharge Planning: Destination: PT-Home ,OT- Home , SLP-  Projected Follow-up: PT-Outpatient PT, OT-  Home health OT, SLP-  Projected Equipment Needs: PT- , OT- None recommended by OT, SLP-  Equipment Details: PT- , OT-  Patient/family involved in discharge planning: PT- Patient,Family member/caregiver,  OT-Patient, SLP-   MD ELOS: 8-12 days. Medical Rehab Prognosis:  Good Assessment: 60 year old right-handed female with history of prior CVA 01/2018 maintained on aspirin status post loop recorder, hypertension, hyperlipidemia.  Patient lives with spouse independent prior to admission.  Two-level home bed and bath upstairs.  She has a sister with good support.  Presented 02/22/2021 with vertigo, acute onset as well as nausea and vomiting.  CT of  the head showed evolving hypodensity involving the superior right cerebellum/cerebellar vermis, concerning for acute right SCA distribution infarction.  No intracranial hemorrhage or mass-effect.  CT angiogram of the head showed focal intimal irregularity involving the distal right V3 segment likely reflecting a short segment dissection.  Downstream occlusion of the mid distal right superior cerebellar artery likely embolic.  MRI of the brain showed a 4-5 cm region of acute infarction within the superior  cerebellum on the right.  Punctate acute infarct in the right dorsal midbrain with mild swelling.  Old infarction in the left superior cerebellum with atrophy/encephalomalacia.  Patient did not receive tPA.  Echocardiogram pending.  Admission chemistries unremarkable except potassium 2.8, glucose 120, urine drug screen negative alcohol negative patient currently maintained on low-dose aspirin as well as Brilinta for CVA prophylaxis x 4 weeks then Brilinta alone.  Tolerating a regular consistency diet.  Therapy evaluations completed due to patient's vertigo and decreased functional mobility was admitted for a comprehensive rehab program. Patient with resulting functional deficits with mobility, tranfers, gait.  Will set goals for Supervision with PT/OT .  Due to the current state of emergency, patients may not be receiving their 3-hours of Medicare-mandated therapy.  See Team Conference Notes for weekly updates to the plan of care

## 2021-02-26 NOTE — Progress Notes (Addendum)
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 [name], underwent extensive evaluation including negative TEE on 03/05/2020 and loop recorder insertion, was on DAPT for 3 weeks followed by aspirin alone; hypertension, hyperlipidemia presented to [facility] ED with similar complaints as her prior stroke including dizziness, vertigo, nausea and vomiting.  LKW 9:30 PM on 5/9.  Code stroke activated.  Seen by neurology.  Admitted for acute right cerebellar stroke with concern for dissection on CTA, tPA deferred, treating with DAPT.    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  No intracranial hemorrhage or mass-effect.  Underlying chronic left ACA distribution infarct.  CTA head and neck: Focal intimal irregularity involving the distal right V3 segment, likely reflecting a short segment dissection.  No frank wrist dissection flap or significant luminal narrowing.  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  No other hemodynamically significant or correctable stenosis.  MRI brain: 4-5 cm region of acute infarction within the superior cerebellum on the right.  Punctate acute infarction in the right dorsal midbrain.  Mild swelling.  No evidence of acute hemorrhage.  Old infarction in the left superior cerebellum with atrophy, encephalomalacia and gliosis and hemosiderin deposition.  Stroke team did not recommend tPA due to mild symptoms, being in the  extended time window and concern for bleeding.  Was on aspirin 81 mg daily PTA.  Neurology recommends aspirin and Brilinta followed by Brilinta alone.  LDL 51.  A1c 6.  Continue statin.  PT/OT/speech therapist assessment are pending.  Echocardiogram has been completed.  Discussed with cardiology.  Unfortunately results not crossing over to the appropriate section in the EMR.  Systolic function was noted to be normal.  No source of stroke was identified.  No significant valvular disease noted.  Hypercoagulable panel was ordered by neurology.  So far work-up has been negative.  Telemetry monitoring shows SR. Loop recorder interrogated on 5/10 and was reported as no A. Fib.  Patient continues to have vision abnormalities and unsteady gait.  No new complaints offered.  Urinary frequency and discomfort/microscopic hematuria Patient reports some difficulty passing urine.  UA was checked which does not show any evidence for infection.  Microscopic hematuria was noted.  This may need evaluation in the outpatient setting. No flank pain. Discussed with patient and was advised to have UA rechecked in outpatient setting. And to seek attention immediately if gross blood is noted.  Essential hypertension  Allowing permissive hypertension.  Resume amlodipine on 5/15.  Hyperlipidemia  LDL 51.  Patient noted to be on atorvastatin.  To continue with home regimen as recommended by stroke team.  LFTs were unremarkable.    Leukocytosis  Suspect reactive in the setting of acute stroke.  Nausea and vomiting likely from acute cerebellar stroke  Seems to have improved.  Hypokalemia  Repleted    DVT prophylaxis: enoxaparin (LOVENOX) injection 40 mg Start: 02/23/21 1000     Code Status: Full Code Family Communication: Discussed with the patient.  No  family at bedside. Disposition: CIR is recommended  Status is: Inpatient  Remains inpatient appropriate because:Inpatient level of care  appropriate due to severity of illness   Dispo:  Patient From: Home  Planned Disposition: Inpatient Rehab  Medically stable for discharge: No          Consultants:   Neurology  Procedures:   None  Antimicrobials:    Anti-infectives (From admission, onward)   None        Subjective:  Patient mentions that she is feeling better.  No new complaints offered.   Objective:   Vitals:   02/25/21 2015 02/25/21 2351 02/26/21 0354 02/26/21 0816  BP: (!) 157/82 (!) 141/77 122/67 (!) 143/81  Pulse: 74 75 84 77  Resp: 18 18 18 18   Temp: 99 F (37.2 C) 98.9 F (37.2 C) 98.9 F (37.2 C) 98.5 F (36.9 C)  TempSrc: Oral Oral Oral Oral  SpO2: 100% 100% 98% 98%  Weight:      Height:        General appearance: Awake alert.  In no distress Resp: Clear to auscultation bilaterally.  Normal effort Cardio: S1-S2 is normal regular.  No S3-S4.  No rubs murmurs or bruit GI: Abdomen is soft.  Nontender nondistended.  Bowel sounds are present normal.  No masses organomegaly Extremities: No edema.  Moving all of her extremities.         Data Reviewed:     CBC: Recent Labs  Lab 02/22/21 2257 02/23/21 0045 02/23/21 0901 02/25/21 0409  WBC 11.2*  --  6.9 6.4  NEUTROABS 4.2  --   --   --   HGB 12.3 12.6 12.6 11.3*  HCT 37.1 37.0 38.5 33.7*  MCV 87.5  --  86.9 86.2  PLT 263  --  238 205    Basic Metabolic Panel: Recent Labs  Lab 02/23/21 0745 02/25/21 0409 02/26/21 0132  NA 134* 137 137  K 3.8 3.1* 4.3  CL 105 106 105  CO2 22 26 27   GLUCOSE 123* 118* 118*  BUN 9 12 15   CREATININE 0.54 0.63 0.61  CALCIUM 8.4* 8.6* 9.0  MG 1.9 2.0  --   PHOS 3.6  --   --     Liver Function Tests: Recent Labs  Lab 02/23/21 0901  AST 43*  ALT 37  ALKPHOS 86  BILITOT 0.6  PROT 7.8  ALBUMIN 3.8    CBG: Recent Labs  Lab 02/23/21 0259  GLUCAP 126*    Microbiology Studies:   Recent Results (from the past 240 hour(s))  Resp Panel by RT-PCR (Flu A&B, Covid)  Nasopharyngeal Swab     Status: None   Collection Time: 02/23/21  1:22 AM   Specimen: Nasopharyngeal Swab; Nasopharyngeal(NP) swabs in vial transport medium  Result Value Ref Range Status   SARS Coronavirus 2 by RT PCR NEGATIVE NEGATIVE Final    Comment: (NOTE) SARS-CoV-2 target nucleic acids are NOT DETECTED.  The SARS-CoV-2 RNA is generally detectable in upper respiratory specimens during the acute phase of infection. The lowest concentration of SARS-CoV-2 viral copies this assay can detect is 138 copies/mL. A negative result does not preclude SARS-Cov-2 infection and should not be used as the sole basis for treatment or other patient management decisions. A negative result may occur with  improper specimen collection/handling, submission of specimen other than nasopharyngeal swab, presence of viral mutation(s) within the areas targeted by this assay, and inadequate number of viral copies(<138 copies/mL). A negative result must be combined with clinical  observations, patient history, and epidemiological information. The expected result is Negative.  Fact Sheet for Patients:  BloggerCourse.com  Fact Sheet for Healthcare Providers:  SeriousBroker.it  This test is no t yet approved or cleared by the [place] FDA and  has been authorized for detection and/or diagnosis of SARS-CoV-2 by FDA under an Emergency Use Authorization (EUA). This EUA will remain  in effect (meaning this test can be used) for the duration of the COVID-19 declaration under Section 564(b)(1) of the Act, 21 U.S.C.section 360bbb-3(b)(1), unless the authorization is terminated  or revoked sooner.       Influenza A by PCR NEGATIVE NEGATIVE Final   Influenza B by PCR NEGATIVE NEGATIVE Final    Comment: (NOTE) The Xpert Xpress SARS-CoV-2/FLU/RSV plus assay is intended as an aid in the diagnosis of influenza from Nasopharyngeal swab specimens and should not be  used as a sole basis for treatment. Nasal washings and aspirates are unacceptable for Xpert Xpress SARS-CoV-2/FLU/RSV testing.  Fact Sheet for Patients: BloggerCourse.com  Fact Sheet for Healthcare Providers: SeriousBroker.it  This test is not yet approved or cleared by the [place] FDA and has been authorized for detection and/or diagnosis of SARS-CoV-2 by FDA under an Emergency Use Authorization (EUA). This EUA will remain in effect (meaning this test can be used) for the duration of the COVID-19 declaration under Section 564(b)(1) of the Act, 21 U.S.C. section 360bbb-3(b)(1), unless the authorization is terminated or revoked.  Performed at [facility], [address] [place], [place] [address]      Radiology Studies:  No results found.   Scheduled Meds:   . aspirin EC  81 mg Oral Daily  . atorvastatin  80 mg Oral Daily  . enoxaparin (LOVENOX) injection  40 mg Subcutaneous Q24H  . polyethylene glycol  17 g Oral Daily  . senna-docusate  2 tablet Oral BID  . ticagrelor  90 mg Oral BID    Continuous Infusions:      LOS: 3 days     Osvaldo Shipper [facility]    To contact the attending provider between 7A-7P or the covering provider during after hours 7P-7A, please log into the web site [email or web address] and access using universal  password for that web site. If you do not have the password, please call the hospital operator.  02/26/2021, 10:39 AM

## 2021-02-27 DIAGNOSIS — I63441 Cerebral infarction due to embolism of right cerebellar artery: Secondary | ICD-10-CM

## 2021-02-27 NOTE — Evaluation (Signed)
Occupational Therapy Assessment and Plan  Patient Details  Name: Judy Gutierrez MRN: [id number] Date of Birth: 08/27/1961  OT Diagnosis: hemiplegia affecting dominant side and muscle weakness (generalized) Rehab Potential: Rehab Potential (ACUTE ONLY): Good ELOS: 10-14 days   Today's Date: 02/27/2021 OT Individual Time: 2035-5974 OT Individual Time Calculation (min): 54 min     Hospital Problem: Principal Problem:   Embolic stroke involving right cerebellar artery ([place])   Past Medical History:  Past Medical History:  Diagnosis Date  . Disturbance of skin sensation   . Myalgia and myositis, unspecified   . Other B-complex deficiencies   . 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 Stroke ([place])   . Unspecified essential hypertension    Past Surgical History:  Past Surgical History:  Procedure Laterality Date  . BUBBLE STUDY  03/05/2020   Procedure: BUBBLE STUDY;  Surgeon: Donato Heinz, MD;  Location: [place];  Service: Cardiovascular;;  . [place]  . LOOP RECORDER INSERTION N/A 03/05/2020   Procedure: LOOP RECORDER INSERTION;  Surgeon: Deboraha Sprang, MD;  Location: [place] [facility];  Service: Cardiovascular;  Laterality: N/A;  . TEE WITHOUT CARDIOVERSION N/A 03/05/2020   Procedure: TRANSESOPHAGEAL ECHOCARDIOGRAM (TEE);  Surgeon: Donato Heinz, MD;  Location: [facility];  Service: Cardiovascular;  Laterality: N/A;    Assessment & Plan Clinical Impression: Judy Gutierrez is a 59 year old right-handed female with history of prior CVA 01/2018 maintained on aspirin status post loop recorder, hypertension, hyperlipidemia.  History taken from chart review and patient. Patient lives with spouse independent prior to admission.  Two-level home bed and bath upstairs.  She has a sister with good support.  She presented on 02/22/2021 with vertigo, acute onset as well as nausea and vomiting.  CT of the head showed evolving hypodensity involving the  superior right cerebellum/cerebellar vermis, concerning for acute right SCA distribution infarction.  No intracranial hemorrhage or mass-effect.  CT angiogram of the head showed focal intimal irregularity involving the distal right V3 segment likely reflecting a short segment dissection.  Downstream occlusion of the mid distal right superior cerebellar artery likely embolic.  MRI of the brain showed a 4-5 cm region of acute infarction within the superior cerebellum on the right. Punctate acute infarct in the right dorsal midbrain with mild swelling.  Old infarction in the left superior cerebellum with atrophy/encephalomalacia.  Patient did not receive tPA.  Echocardiogram with EF of 16-38%, grade 1 diastolic dysfunction.  Admission chemistries unremarkable except potassium 2.8, glucose 120, urine drug screen negative alcohol negative patient currently maintained on low-dose aspirin as well as Brilinta for CVA prophylaxis x 4 weeks then Brilinta alone.  Subcutaneous Lovenox for DVT prophylaxis.  Tolerating a regular consistency diet.  Therapy evaluations completed due to patient's vertigo and decreased functional mobility was admitted for a comprehensive rehab program. Please see preadmission assessment from earlier today. .  Patient transferred to CIR on 02/26/2021 .    Patient currently requires min with basic self-care skills and IADL secondary to muscle weakness, ataxia, decreased visual acuity and decreased standing balance, decreased postural control, hemiplegia and decreased balance strategies.  Prior to hospitalization, patient could complete ADLs with independent .  Patient will benefit from skilled intervention to decrease level of assist with basic self-care skills and increase level of independence with iADL prior to discharge home with care partner.  Anticipate patient will require intermittent supervision and follow up home health.  OT - End of Session Activity Tolerance: Tolerates 30+  min  activity with multiple rests Endurance Deficit: Yes Endurance Deficit Description: generalized deconditioning OT Assessment Rehab Potential (ACUTE ONLY): Good OT Barriers to Discharge: Decreased caregiver support OT Barriers to Discharge Comments: reports her husband works all day OT Patient demonstrates impairments in the following area(s): Balance;Safety;Endurance;Motor;Vision OT Basic ADL's Functional Problem(s): Bathing;Dressing;Toileting OT Transfers Functional Problem(s): Toilet;Tub/Shower OT Additional Impairment(s): None OT Plan OT Intensity: Minimum of 1-2 x/day, 45 to 90 minutes OT Frequency: 5 out of 7 days OT Duration/Estimated Length of Stay: 10-14 days OT Treatment/Interventions: Balance/vestibular training;Discharge planning;Pain management;Self Care/advanced ADL retraining;Therapeutic Activities;UE/LE Coordination activities;Visual/perceptual remediation/compensation;Therapeutic Exercise;Patient/family education;Functional mobility training;Community reintegration;DME/adaptive equipment instruction;Psychosocial support;Neuromuscular re-education;UE/LE Strength taining/ROM OT Self Feeding Anticipated Outcome(s): no goal set OT Basic Self-Care Anticipated Outcome(s): set up assist OT Toileting Anticipated Outcome(s): supervision OT Bathroom Transfers Anticipated Outcome(s): supervision OT Recommendation Patient destination: Home Follow Up Recommendations: Home health OT Equipment Recommended: None recommended by OT   OT Evaluation Precautions/Restrictions  Precautions Precautions: Fall Precaution Comments: Diplopia, trunk/RUE/LE ataxic Restrictions Weight Bearing Restrictions: No General Chart Reviewed: Yes Family/Caregiver Present: No Vital Signs   Pain Pain Assessment Pain Scale: 0-10 Pain Score: 0-No pain Home Living/Prior Functioning Home Living Family/patient expects to be discharged to:: Private residence Living Arrangements: Spouse/significant  other Available Help at Discharge: Family Type of Home: House Home Access: Stairs to enter Technical brewer of Steps: 4 Entrance Stairs-Rails: Right,Left Home Layout: Two level,Bed/bath upstairs Alternate Level Stairs-Rails: Left Bathroom Shower/Tub: Multimedia programmer: Standard Bathroom Accessibility: Yes Additional Comments: Has RW, BSC, shower chair  Lives With: Spouse IADL History Homemaking Responsibilities: Yes Meal Prep Responsibility: Primary Laundry Responsibility: Primary Cleaning Responsibility: Primary Bill Paying/Finance Responsibility: Primary Shopping Responsibility: Primary Current License: Yes Mode of Transportation: Car Occupation: Retired Prior Function Level of Independence: Independent with basic ADLs,Independent with transfers,Independent with gait  Able to Take Stairs?: Yes Driving: Yes Vocation: Retired Comments: Independent with meds, ADL, IADL.  Spouse owns a Environmental consultant in town Vision Baseline Vision/History: No visual deficits Patient Visual Report: Diplopia Vision Assessment?: Yes Eye Alignment: Within Functional Limits Ocular Range of Motion: Within Functional Limits Alignment/Gaze Preference: Within Defined Limits Tracking/Visual Pursuits: Decreased smoothness of vertical tracking;Decreased smoothness of horizontal tracking Saccades: Decreased speed of saccadic movement Convergence: Impaired (comment) Diplopia Assessment: Present in primary gaze;Objects split on top of one another Perception  Perception: Within Functional Limits Praxis Praxis: Intact Cognition Overall Cognitive Status: Within Functional Limits for tasks assessed Arousal/Alertness: Awake/alert Orientation Level: Person;Situation;Place Person: Oriented Place: Oriented Situation: Oriented Year: 2022 Month: May Day of Week: Correct Memory: Appears intact Immediate Memory Recall: Sock;Blue;Bed Memory Recall Sock: Without Cue Memory Recall Blue:  Without Cue Memory Recall Bed: Without Cue Attention: Selective Selective Attention: Appears intact Awareness: Appears intact Problem Solving: Appears intact Safety/Judgment: Appears intact Sensation Sensation Light Touch: Appears Intact Hot/Cold: Appears Intact Proprioception: Appears Intact Coordination Gross Motor Movements are Fluid and Coordinated: No Fine Motor Movements are Fluid and Coordinated: No Coordination and Movement Description: ataxic trunk and RUE Finger Nose Finger Test: RUE slow and overshooting 9 Hole Peg Test: R: 54 sec, L: 34 sec Motor  Motor Motor: Ataxia Motor - Skilled Clinical Observations: trunk and RUE ataxia  Trunk/Postural Assessment  Cervical Assessment Cervical Assessment: Within Functional Limits Thoracic Assessment Thoracic Assessment: Within Functional Limits Lumbar Assessment Lumbar Assessment: Within Functional Limits Postural Control Postural Control: Deficits on evaluation Trunk Control: ataxic Righting Reactions: delayed and inadequate  Balance Balance Balance Assessed: Yes Static Sitting Balance Static Sitting - Balance Support: Feet supported Static  Sitting - Level of Assistance: 6: Modified independent (Device/Increase time) Dynamic Sitting Balance Dynamic Sitting - Balance Support: Feet supported Dynamic Sitting - Level of Assistance: 5: Stand by assistance Static Standing Balance Static Standing - Balance Support: During functional activity;Right upper extremity supported Static Standing - Level of Assistance: 4: Min assist Dynamic Standing Balance Dynamic Standing - Balance Support: During functional activity;Right upper extremity supported Dynamic Standing - Level of Assistance: 3: Mod assist Extremity/Trunk Assessment RUE Assessment RUE Assessment: Exceptions to [facility] General Strength Comments: MMT- 4/5, able to use the RUE functionally despite ataxic movements RUE Body System: Neuro Brunstrum levels for arm and hand:  Arm;Hand Brunstrum level for arm: Stage V Relative Independence from Synergy Brunstrum level for hand: Stage VI Isolated joint movements LUE Assessment LUE Assessment: Within Functional Limits  Care Tool Care Tool Self Care Eating   Eating Assist Level: Set up assist    Oral Care    Oral Care Assist Level: Supervision/Verbal cueing    Bathing   Body parts bathed by patient: Right arm;Right lower leg;Left arm;Chest;Abdomen;Front perineal area;Buttocks;Left upper leg;Left lower leg;Face;Right upper leg     Assist Level: Minimal Assistance - Patient > 75%    Upper Body Dressing(including orthotics)   What is the patient wearing?: Pull over shirt   Assist Level: Supervision/Verbal cueing    Lower Body Dressing (excluding footwear)   What is the patient wearing?: Pants Assist for lower body dressing: Minimal Assistance - Patient > 75%    Putting on/Taking off footwear   What is the patient wearing?: Non-skid slipper socks Assist for footwear: Set up assist       Care Tool Toileting Toileting activity   Assist for toileting: Moderate Assistance - Patient 50 - 74%     Care Tool Bed Mobility Roll left and right activity   Roll left and right assist level: Supervision/Verbal cueing    Sit to lying activity   Sit to lying assist level: Supervision/Verbal cueing    Lying to sitting edge of bed activity   Lying to sitting edge of bed assist level: Supervision/Verbal cueing     Care Tool Transfers Sit to stand transfer   Sit to stand assist level: Minimal Assistance - Patient > 75%    Chair/bed transfer   Chair/bed transfer assist level: Moderate Assistance - Patient 50 - 74%     Toilet transfer   Assist Level: Moderate Assistance - Patient 50 - 74%     Care Tool Cognition Expression of Ideas and Wants Expression of Ideas and Wants: Some difficulty - exhibits some difficulty with expressing needs and ideas (e.g, some words or finishing thoughts) or speech is not  clear   Understanding Verbal and Non-Verbal Content Understanding Verbal and Non-Verbal Content: Usually understands - understands most conversations, but misses some part/intent of message. Requires cues at times to understand   Memory/Recall Ability *first 3 days only Memory/Recall Ability *first 3 days only: Current season;Location of own room;Staff names and faces;That he or she is in a hospital/hospital unit    Refer to Care Plan for [place] 1 OT Short Term Goal 1 (Week 1): Pt will complete toileting tasks at CGA level OT Short Term Goal 2 (Week 1): Pt will don pants with CGA OT Short Term Goal 3 (Week 1): Pt will utilize appropriate visual compensation strategies during ADL routine with min cueing OT Short Term Goal 4 (Week 1): Pt will transfer to walk in shower with min  A  Recommendations for other services: None    Skilled Therapeutic Intervention Pt received supine, agreeable to OT and requesting a shower. Reviewed CVA recovery, CLOF, goals, CIR expectations, and OT POC. Pt completed shower level ADLs as described below. R hemi is very mild with pt limited mainly by ataxia in her trunk and RUE. She is able to functionally use her RUE during ADLs without cueing, although lacking coordination. Pt was left supine with all needs met, bed alarm set.   ADL ADL Eating: Supervision/safety Where Assessed-Eating: Edge of bed Grooming: Contact guard Where Assessed-Grooming: Standing at sink Upper Body Bathing: Supervision/safety Where Assessed-Upper Body Bathing: Sitting at sink Lower Body Bathing: Minimal assistance Where Assessed-Lower Body Bathing: Shower Upper Body Dressing: Supervision/safety Where Assessed-Upper Body Dressing: Edge of bed Lower Body Dressing: Minimal assistance Where Assessed-Lower Body Dressing: Sitting at sink;Standing at sink Toileting: Moderate assistance Where Assessed-Toileting: Glass blower/designer: Moderate  assistance Toilet Transfer Method: Counselling psychologist: Energy manager: Moderate assistance Social research officer, government Method: Heritage manager: Manufacturing systems engineer  Bed Mobility Bed Mobility: Rolling Right;Rolling Left;Supine to Sit;Sit to Supine Rolling Right: Supervision/verbal cueing Rolling Left: Supervision/Verbal cueing Supine to Sit: Supervision/Verbal cueing Sit to Supine: Supervision/Verbal cueing Transfers Sit to Stand: Minimal Assistance - Patient > 75%   Discharge Criteria: Patient will be discharged from OT if patient refuses treatment 3 consecutive times without medical reason, if treatment goals not met, if there is a change in medical status, if patient makes no progress towards goals or if patient is discharged from hospital.  The above assessment, treatment plan, treatment alternatives and goals were discussed and mutually agreed upon: by patient  Curtis Sites 02/27/2021, 12:54 PM

## 2021-02-27 NOTE — Plan of Care (Signed)
  Problem: Consults Goal: RH STROKE PATIENT EDUCATION Description: See Patient Education module for education specifics  Outcome: Progressing   Problem: RH SKIN INTEGRITY Goal: RH STG SKIN FREE OF INFECTION/BREAKDOWN Description: Manage skin and keep infection at bay With min assist Outcome: Progressing Goal: RH STG ABLE TO PERFORM INCISION/WOUND CARE W/ASSISTANCE Description: STG Able To Perform Incision/Wound Care With min  Assistance. Outcome: Progressing   Problem: RH SAFETY Goal: RH STG ADHERE TO SAFETY PRECAUTIONS W/ASSISTANCE/DEVICE Description: STG Adhere to Safety Precautions With cues/reminders Assistance/Device. Outcome: Progressing   Problem: RH PAIN MANAGEMENT Goal: RH STG PAIN MANAGED AT OR BELOW PT'S PAIN GOAL Description: At or below level 4 Outcome: Progressing   Problem: RH KNOWLEDGE DEFICIT Goal: RH STG INCREASE KNOWLEDGE OF DIABETES Description: Patient will be able to manage prediabetes with dietary modifications using handouts and educational materials independently Outcome: Progressing Goal: RH STG INCREASE KNOWLEDGE OF HYPERTENSION Description: Patient will be able to manage HTN with medications and dietary modifications  using handouts independently Outcome: Progressing Goal: RH STG INCREASE KNOWLEGDE OF HYPERLIPIDEMIA Description: Patient will be able to manage HLD with medications and dietary modifications  using handouts independently Outcome: Progressing Goal: RH STG INCREASE KNOWLEDGE OF STROKE PROPHYLAXIS Description: Patient will be able to manage secondary stroke risks with medications and dietary modifications  using handouts independently Outcome: Progressing   Problem: RH Vision Goal: RH LTG Vision (Specify) Outcome: Progressing   

## 2021-02-27 NOTE — Evaluation (Signed)
Physical Therapy Assessment and Plan  Patient Details  Name: Faiza Bansal MRN: [id number] Date of Birth: 1961-01-07  PT Diagnosis: Abnormality of gait, Ataxia, Difficulty walking, Hemiparesis dominant and Muscle weakness Rehab Potential: Good ELOS: 10-14   Today's Date: 02/27/2021 PT Individual Time: 1400-1500 PT Individual Time Calculation (min): 60 min    Hospital Problem: Principal Problem:   Embolic stroke involving right cerebellar artery ([place])   Past Medical History:  Past Medical History:  Diagnosis Date  . Disturbance of skin sensation   . Myalgia and myositis, unspecified   . Other B-complex deficiencies   . 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 Stroke ([place])   . Unspecified essential hypertension    Past Surgical History:  Past Surgical History:  Procedure Laterality Date  . BUBBLE STUDY  03/05/2020   Procedure: BUBBLE STUDY;  Surgeon: Donato Heinz, MD;  Location: [facility];  Service: Cardiovascular;;  . [place]  . LOOP RECORDER INSERTION N/A 03/05/2020   Procedure: LOOP RECORDER INSERTION;  Surgeon: Deboraha Sprang, MD;  Location: [facility];  Service: Cardiovascular;  Laterality: N/A;  . TEE WITHOUT CARDIOVERSION N/A 03/05/2020   Procedure: TRANSESOPHAGEAL ECHOCARDIOGRAM (TEE);  Surgeon: Donato Heinz, MD;  Location: [facility] ENDOSCOPY;  Service: Cardiovascular;  Laterality: N/A;    Assessment & Plan Clinical Impression: Patient is a 60 y.o. year old female with history of prior CVA 01/2018 maintained on aspirin status post loop recorder, hypertension, hyperlipidemia.  History taken from chart review and patient. Patient lives with spouse independent prior to admission.  Two-level home bed and bath upstairs.  She has a sister with good support.  She presented on 02/22/2021 with vertigo, acute onset as well as nausea and vomiting.  CT of the head showed evolving hypodensity involving the superior right  cerebellum/cerebellar vermis, concerning for acute right SCA distribution infarction.  No intracranial hemorrhage or mass-effect.  CT angiogram of the head showed focal intimal irregularity involving the distal right V3 segment likely reflecting a short segment dissection.  Downstream occlusion of the mid distal right superior cerebellar artery likely embolic.  MRI of the brain showed a 4-5 cm region of acute infarction within the superior cerebellum on the right. Punctate acute infarct in the right dorsal midbrain with mild swelling.  Old infarction in the left superior cerebellum with atrophy/encephalomalacia.  Patient did not receive tPA.  Echocardiogram with EF of 42-68%, grade 1 diastolic dysfunction.  Admission chemistries unremarkable except potassium 2.8, glucose 120, urine drug screen negative alcohol negative patient currently maintained on low-dose aspirin as well as Brilinta for CVA prophylaxis x 4 weeks then Brilinta alone.  Subcutaneous Lovenox for DVT prophylaxis.  Tolerating a regular consistency diet.  Therapy evaluations completed due to patient's vertigo and decreased functional mobility was admitted for a comprehensive rehab program. Please see preadmission assessment from earlier today.   Patient currently requires min with mobility secondary to muscle weakness, decreased cardiorespiratoy endurance, ataxia, decreased visual acuity and decreased standing balance, decreased postural control, hemiplegia and decreased balance strategies.  Prior to hospitalization, patient was independent  with mobility and lived with Spouse in a House home.  Home access is 4Stairs to enter.  Patient will benefit from skilled PT intervention to maximize safe functional mobility, minimize fall risk and decrease caregiver burden for planned discharge home with 24 hour supervision.  Anticipate patient will benefit from follow up OP at discharge.  PT - End of Session Activity Tolerance: Tolerates 30+ min activity  with multiple rests Endurance Deficit: Yes Endurance Deficit Description: generalized deconditioning PT Assessment Rehab Potential (ACUTE/IP ONLY): Good PT Barriers to Discharge: Home environment access/layout PT Patient demonstrates impairments in the following area(s): Balance;Endurance;Motor;Safety;Sensory;Skin Integrity PT Transfers Functional Problem(s): Bed Mobility;Bed to Chair;Car;Furniture PT Locomotion Functional Problem(s): Ambulation;Stairs PT Plan PT Intensity: Minimum of 1-2 x/day ,45 to 90 minutes PT Frequency: 5 out of 7 days PT Duration Estimated Length of Stay: 10-14 PT Treatment/Interventions: Ambulation/gait training;Discharge planning;Functional mobility training;Psychosocial support;Therapeutic Activities;Visual/perceptual remediation/compensation;Balance/vestibular training;Disease management/prevention;Neuromuscular re-education;Skin care/wound management;Therapeutic Exercise;Wheelchair propulsion/positioning;Cognitive remediation/compensation;DME/adaptive equipment instruction;Pain management;Splinting/orthotics;UE/LE Strength taining/ROM;Community reintegration;Functional electrical stimulation;Patient/family education;Stair training;UE/LE Coordination activities PT Transfers Anticipated Outcome(s): spv PT Locomotion Anticipated Outcome(s): spv PT Recommendation Recommendations for Other Services: Therapeutic Recreation consult Therapeutic Recreation Interventions: Outing/community reintergration Follow Up Recommendations: Outpatient PT Patient destination: Home   PT Evaluation Precautions/Restrictions Precautions Precautions: Fall Precaution Comments: Diplopia, trunk/RUE/LE ataxic Restrictions Weight Bearing Restrictions: No Home Living/Prior Functioning Home Living Available Help at Discharge: Family Type of Home: House Home Access: Stairs to enter Technical brewer of Steps: 4 Entrance Stairs-Rails: Left Home Layout: Two level;Bed/bath  upstairs Alternate Level Stairs-Rails: Left Bathroom Shower/Tub: Multimedia programmer: Standard Bathroom Accessibility: Yes Additional Comments: Has RW, BSC, shower chair  Lives With: Spouse Prior Function Level of Independence: Independent with basic ADLs;Independent with transfers;Independent with gait;Independent with homemaking with ambulation  Able to Take Stairs?: Yes Driving: Yes Vocation: Retired Comments: Independent with meds, ADL, IADL.  Spouse owns a Environmental consultant in town Vision/Perception  Vision - Assessment Eye Alignment: Within Passenger transport manager Range of Motion: Within Functional Limits Alignment/Gaze Preference: Within Defined Limits Tracking/Visual Pursuits: Decreased smoothness of vertical tracking;Decreased smoothness of horizontal tracking Saccades: Decreased speed of saccadic movement Convergence: Impaired (comment) Diplopia Assessment: Present in primary gaze;Objects split on top of one another Perception Perception: Within Functional Limits Praxis Praxis: Intact  Cognition Overall Cognitive Status: Within Functional Limits for tasks assessed Arousal/Alertness: Awake/alert Orientation Level: Oriented X4 Attention: Selective Selective Attention: Appears intact Memory: Appears intact Immediate Memory Recall: Sock;Blue;Bed Memory Recall Sock: Without Cue Memory Recall Blue: Without Cue Memory Recall Bed: Without Cue Awareness: Appears intact Problem Solving: Appears intact Safety/Judgment: Appears intact Sensation Sensation Light Touch: Appears Intact Hot/Cold: Not tested Proprioception: Appears Intact Stereognosis: Appears Intact Coordination Gross Motor Movements are Fluid and Coordinated: No Fine Motor Movements are Fluid and Coordinated: No Coordination and Movement Description: ataxic trunk and RUE Finger Nose Finger Test: RUE slow and overshooting Heel Shin Test: WFL B; figure 8 slightly dysmetric R LE 9 Hole Peg Test:  R: 54 sec, L: 34 sec Motor  Motor Motor: Ataxia Motor - Skilled Clinical Observations: trunk and RUE ataxia, very mild R hemi   Trunk/Postural Assessment  Cervical Assessment Cervical Assessment: Within Functional Limits Thoracic Assessment Thoracic Assessment: Within Functional Limits Lumbar Assessment Lumbar Assessment: Within Functional Limits Postural Control Postural Control: Deficits on evaluation Trunk Control: ataxic Righting Reactions: delayed and inadequate  Balance Balance Balance Assessed: Yes Standardized Balance Assessment Standardized Balance Assessment: Berg Balance Test Berg Balance Test Sit to Stand: Able to stand using hands after several tries Standing Unsupported: Able to stand 2 minutes with supervision Sitting with Back Unsupported but Feet Supported on Floor or Stool: Able to sit safely and securely 2 minutes Stand to Sit: Controls descent by using hands Transfers: Needs one person to assist Standing Unsupported with Eyes Closed: Able to stand 10 seconds with supervision Standing Ubsupported with Feet Together: Able to place feet together independently but unable to hold for 30 seconds  From Standing, Reach Forward with Outstretched Arm: Reaches forward but needs supervision From Standing Position, Pick up Object from Floor: Unable to try/needs assist to keep balance From Standing Position, Turn to Look Behind Over each Shoulder: Needs supervision when turning Turn 360 Degrees: Needs assistance while turning Standing Unsupported, Alternately Place Feet on Step/Stool: Able to complete >2 steps/needs minimal assist Standing Unsupported, One Foot in Front: Loses balance while stepping or standing Standing on One Leg: Unable to try or needs assist to prevent fall Total Score: 21 Static Sitting Balance Static Sitting - Balance Support: Feet supported Static Sitting - Level of Assistance: 6: Modified independent (Device/Increase time) Dynamic Sitting  Balance Dynamic Sitting - Balance Support: Feet supported;During functional activity Dynamic Sitting - Level of Assistance: 5: Stand by assistance Static Standing Balance Static Standing - Balance Support: During functional activity;Bilateral upper extremity supported Static Standing - Level of Assistance: 4: Min assist Dynamic Standing Balance Dynamic Standing - Balance Support: During functional activity;Bilateral upper extremity supported Dynamic Standing - Level of Assistance: 3: Mod assist Extremity Assessment  RUE Assessment RUE Assessment: Exceptions to [facility] General Strength Comments: MMT- 4/5, able to use the RUE functionally despite ataxic movements RUE Body System: Neuro Brunstrum levels for arm and hand: Arm;Hand Brunstrum level for arm: Stage V Relative Independence from Synergy Brunstrum level for hand: Stage VI Isolated joint movements LUE Assessment LUE Assessment: Within Functional Limits RLE Assessment RLE Assessment: Exceptions to [facility] General Strength Comments: grossly 4/5 LLE Assessment LLE Assessment: Exceptions to [facility] General Strength Comments: grossly 4+/5  Care Tool Care Tool Bed Mobility Roll left and right activity   Roll left and right assist level: Supervision/Verbal cueing    Sit to lying activity   Sit to lying assist level: Supervision/Verbal cueing    Lying to sitting edge of bed activity   Lying to sitting edge of bed assist level: Supervision/Verbal cueing     Care Tool Transfers Sit to stand transfer   Sit to stand assist level: Minimal Assistance - Patient > 75%    Chair/bed transfer   Chair/bed transfer assist level: Minimal Assistance - Patient > 75%     Toilet transfer   Assist Level: Minimal Assistance - Patient > 75%    Car transfer   Car transfer assist level: Minimal Assistance - Patient > 75%      Care Tool Locomotion Ambulation   Assist level: Minimal Assistance - Patient > 75% Assistive device: Walker-rolling Max  distance: 160  Walk 10 feet activity   Assist level: Minimal Assistance - Patient > 75% Assistive device: Walker-rolling   Walk 50 feet with 2 turns activity   Assist level: Minimal Assistance - Patient > 75% Assistive device: Walker-rolling  Walk 150 feet activity   Assist level: Minimal Assistance - Patient > 75% Assistive device: Walker-rolling  Walk 10 feet on uneven surfaces activity Walk 10 feet on uneven surfaces activity did not occur: Safety/medical concerns      Stairs   Assist level: Minimal Assistance - Patient > 75% Stairs assistive device: 1 hand rail (B UE on L HR) Max number of stairs: 12  Walk up/down 1 step activity   Walk up/down 1 step (curb) assist level: Minimal Assistance - Patient > 75% Walk up/down 1 step or curb assistive device: 1 hand rail    Walk up/down 4 steps activity Walk up/down 4 steps assist level: Minimal Assistance - Patient > 75% Walk up/down 4 steps assistive device: 1 hand rail  Walk up/down 12  steps activity   Walk up/down 12 steps assist level: Minimal Assistance - Patient > 75% Walk up/down 12 steps assistive device: 1 hand rail  Pick up small objects from floor Pick up small object from the floor (from standing position) activity did not occur: Safety/medical concerns      Wheelchair Will patient use wheelchair at discharge?: No          Wheel 50 feet with 2 turns activity      Wheel 150 feet activity        Refer to Care Plan for Long Term Goals  SHORT TERM GOAL WEEK 1 PT Short Term Goal 1 (Week 1): Patient will complete bed<> wc transfers with LRAD and CGA consistently PT Short Term Goal 2 (Week 1): Patient will ambulate at least 179f with LRAD and CGA consistently PT Short Term Goal 3 (Week 1): Patient will improve BBS >5 pts  Recommendations for other services: Therapeutic Recreation  Outing/community reintegration  Skilled Therapeutic Intervention Mobility Bed Mobility Bed Mobility: Rolling Right;Rolling  Left;Supine to Sit;Sit to Supine Rolling Right: Supervision/verbal cueing Rolling Left: Supervision/Verbal cueing Supine to Sit: Supervision/Verbal cueing Sit to Supine: Supervision/Verbal cueing Transfers Transfers: Sit to Stand;Stand Pivot Transfers;Stand to Sit Sit to Stand: Minimal Assistance - Patient > 75% Stand to Sit: Minimal Assistance - Patient > 75% Stand Pivot Transfers: Minimal Assistance - Patient > 75% Stand Pivot Transfer Details: Verbal cues for technique;Verbal cues for gait pattern;Verbal cues for safe use of DME/AE Transfer (Assistive device): Rolling walker Locomotion  Gait Ambulation: Yes Gait Assistance: Moderate Assistance - Patient 50-74% Gait Distance (Feet): 160 Feet Assistive device: Rolling walker Gait Assistance Details: Verbal cues for precautions/safety;Verbal cues for technique;Verbal cues for gait pattern;Verbal cues for safe use of DME/AE Gait Gait: Yes Gait Pattern: Impaired Gait Pattern: Ataxic;Wide base of support;Narrow base of support;Step-through pattern Gait velocity: decr Stairs / Additional Locomotion Stairs: Yes Stairs Assistance: Minimal Assistance - Patient > 75% Stair Management Technique: One rail Left (B UE) Number of Stairs: 12 Height of Stairs: 6 Ramp: Minimal Assistance - Patient >75% Wheelchair Mobility Wheelchair Mobility: No  Patient received supine in bed, agreeable to PT eval. She denies pain and requested assist to bathroom. She was able to come sit edge of bed with supervision and ambulate into bathroom with RW and MinA. Truncal ataxia noted with minor R LE ataxia noted as well. Patient continent of bladder and able to complete perihygiene seated with supervision. Clothing management completed in standing with MinA for postural support. Patient grossly MinA with AD or ModA without AD due to truncal ataxia. Patient able to ambulate at more typical gait speed and with fewer gait impairments when PT added 12# to RW. She scored  a 21/56 on the berg balance scale indicating significant risk for falling and benefit from use of AD. Discussed findings with patient and she verbalized understanding. Patient returning to room in wc, requesting to return to bed. Bed alarm on, call light within reach.   Discharge Criteria: Patient will be discharged from PT if patient refuses treatment 3 consecutive times without medical reason, if treatment goals not met, if there is a change in medical status, if patient makes no progress towards goals or if patient is discharged from hospital.  The above assessment, treatment plan, treatment alternatives and goals were discussed and mutually agreed upon: by patient and by family  JDebbora Dus5/14/2022, 3:02 PM

## 2021-02-27 NOTE — Progress Notes (Signed)
PROGRESS NOTE   Subjective/Complaints:  No hiccups, some dizziness when up , has double vision that imprvoes with eye closure  ROS- no CP, SOB< N/V/D Objective:   No results found. Recent Labs    02/25/21 0409  WBC 6.4  HGB 11.3*  HCT 33.7*  PLT 205   Recent Labs    02/25/21 0409 02/26/21 0132  NA 137 137  K 3.1* 4.3  CL 106 105  CO2 26 27  GLUCOSE 118* 118*  BUN 12 15  CREATININE 0.63 0.61  CALCIUM 8.6* 9.0   No intake or output data in the 24 hours ending 02/27/21 0755      Physical Exam: Vital Signs Blood pressure 115/70, pulse 79, temperature 98.7 F (37.1 C), resp. rate 18, height 5\' 2"  (1.575 m), weight 60.2 kg, last menstrual period 12/08/2010, SpO2 100 %.   General: No acute distress Mood and affect are appropriate Heart: Regular rate and rhythm no rubs murmurs or extra sounds Lungs: Clear to auscultation, breathing unlabored, no rales or wheezes Abdomen: Positive bowel sounds, soft nontender to palpation, nondistended Extremities: No clubbing, cyanosis, or edema Skin: No evidence of breakdown, no evidence of rash Neurologic: Cranial nerves II through XII intact, motor strength is 5/5 in bilateral deltoid, bicep, tricep, grip, hip flexor, knee extensors, ankle dorsiflexor and plantar flexor Sensory exam normal sensation to light touch and proprioception in bilateral upper and lower extremities Cerebellar exam Dysmetria Right finger nose finger and heel shin, Musculoskeletal: Full range of motion in all 4 extremities. No joint swelling   Assessment/Plan: 1. Functional deficits which require 3+ hours per day of interdisciplinary therapy in a comprehensive inpatient rehab setting.  Physiatrist is providing close team supervision and 24 hour management of active medical problems listed below.  Physiatrist and rehab team continue to assess barriers to discharge/monitor patient progress toward  functional and medical goals  Care Tool:  Bathing              Bathing assist       Upper Body Dressing/Undressing Upper body dressing        Upper body assist Assist Level: Set up assist    Lower Body Dressing/Undressing Lower body dressing            Lower body assist Assist for lower body dressing: Minimal Assistance - Patient > 75%     Toileting Toileting    Toileting assist Assist for toileting: Minimal Assistance - Patient > 75%     Transfers Chair/bed transfer  Transfers assist           Locomotion Ambulation   Ambulation assist              Walk 10 feet activity   Assist           Walk 50 feet activity   Assist           Walk 150 feet activity   Assist           Walk 10 feet on uneven surface  activity   Assist           Wheelchair     Assist  Wheelchair 50 feet with 2 turns activity    Assist            Wheelchair 150 feet activity     Assist          Blood pressure 115/70, pulse 79, temperature 98.7 F (37.1 C), resp. rate 18, height 5\' 2"  (1.575 m), weight 60.2 kg, last menstrual period 12/08/2010, SpO2 100 %.    Medical Problem List and Plan: 1.  Vertigo nausea vomiting secondary to RIght  superior cerebellum infarct with punctate acute infarct in the right dorsal midbrain as well as history of CVA 2019/loop recorder             -patient may shower             -ELOS/Goals: Supervision/Mod I/8-13 days.             Admit to CIR 2.  Antithrombotics: -DVT/anticoagulation: Lovenox             -antiplatelet therapy: Aspirin 81 mg daily and Brilinta 90 mg twice daily x4 weeks then Brilinta alone 3. Pain Management: Tylenol as needed             Monitor, particularly for headaches with increased exertion 4. Mood: Provide emotional support             -antipsychotic agents: N/A 5. Neuropsych: This patient is capable of making decisions on her own behalf. 6.  Skin/Wound Care: Routine skin checks 7. Fluids/Electrolytes/Nutrition: Routine in and outs             CMP ordered 8.  Permissive hypertension.  Patient on Cozaar50 mg daily, Norvasc 5 mg daily prior to admission.               Vitals with BMI 02/27/2021 02/26/2021 02/26/2021  Height - - -  Weight - - -  BMI - - -  Systolic 115 139 02/28/2021  Diastolic 70 81 73  Pulse 79 79 83             9. Hyperlipidemia: Lipitor 10. ABLA             CBC ordered  LOS: 1 days A FACE TO FACE EVALUATION WAS PERFORMED  657 02/27/2021, 7:55 AM

## 2021-02-27 NOTE — Progress Notes (Signed)
Inpatient Rehabilitation  Patient information reviewed and entered into eRehab system by Donie Lemelin M. Andreu Drudge, M.A., CCC/SLP, PPS Coordinator.  Information including medical coding, functional ability and quality indicators will be reviewed and updated through discharge.    

## 2021-02-27 NOTE — Plan of Care (Signed)
  Problem: RH Balance Goal: LTG Patient will maintain dynamic sitting balance (PT) Description: LTG:  Patient will maintain dynamic sitting balance with assistance during mobility activities (PT) Flowsheets (Taken 02/27/2021 1532) LTG: Pt will maintain dynamic sitting balance during mobility activities with:: Independent Goal: LTG Patient will maintain dynamic standing balance (PT) Description: LTG:  Patient will maintain dynamic standing balance with assistance during mobility activities (PT) Flowsheets (Taken 02/27/2021 1532) LTG: Pt will maintain dynamic standing balance during mobility activities with:: Supervision/Verbal cueing   Problem: RH Bed Mobility Goal: LTG Patient will perform bed mobility with assist (PT) Description: LTG: Patient will perform bed mobility with assistance, with/without cues (PT). Flowsheets (Taken 02/27/2021 1532) LTG: Pt will perform bed mobility with assistance level of: Independent   Problem: RH Bed to Chair Transfers Goal: LTG Patient will perform bed/chair transfers w/assist (PT) Description: LTG: Patient will perform bed to chair transfers with assistance (PT). Flowsheets (Taken 02/27/2021 1532) LTG: Pt will perform Bed to Chair Transfers with assistance level: Supervision/Verbal cueing   Problem: RH Ambulation Goal: LTG Patient will ambulate in controlled environment (PT) Description: LTG: Patient will ambulate in a controlled environment, # of feet with assistance (PT). Flowsheets (Taken 02/27/2021 1532) LTG: Pt will ambulate in controlled environ  assist needed:: Supervision/Verbal cueing LTG: Ambulation distance in controlled environment: 150 Goal: LTG Patient will ambulate in home environment (PT) Description: LTG: Patient will ambulate in home environment, # of feet with assistance (PT). Flowsheets (Taken 02/27/2021 1532) LTG: Pt will ambulate in home environ  assist needed:: Supervision/Verbal cueing LTG: Ambulation distance in home environment:  50   Problem: RH Stairs Goal: LTG Patient will ambulate up and down stairs w/assist (PT) Description: LTG: Patient will ambulate up and down # of stairs with assistance (PT) Flowsheets (Taken 02/27/2021 1532) LTG: Pt will ambulate up/down stairs assist needed:: Supervision/Verbal cueing LTG: Pt will  ambulate up and down number of stairs: 14 Note: L HR per in home set up

## 2021-02-28 NOTE — Plan of Care (Signed)
  Problem: Consults Goal: RH STROKE PATIENT EDUCATION Description: See Patient Education module for education specifics  Outcome: Progressing   Problem: RH SKIN INTEGRITY Goal: RH STG SKIN FREE OF INFECTION/BREAKDOWN Description: Manage skin and keep infection at bay With min assist Outcome: Progressing Goal: RH STG ABLE TO PERFORM INCISION/WOUND CARE W/ASSISTANCE Description: STG Able To Perform Incision/Wound Care With min  Assistance. Outcome: Progressing   Problem: RH SAFETY Goal: RH STG ADHERE TO SAFETY PRECAUTIONS W/ASSISTANCE/DEVICE Description: STG Adhere to Safety Precautions With cues/reminders Assistance/Device. Outcome: Progressing   Problem: RH PAIN MANAGEMENT Goal: RH STG PAIN MANAGED AT OR BELOW PT'S PAIN GOAL Description: At or below level 4 Outcome: Progressing   Problem: RH KNOWLEDGE DEFICIT Goal: RH STG INCREASE KNOWLEDGE OF DIABETES Description: Patient will be able to manage prediabetes with dietary modifications using handouts and educational materials independently Outcome: Progressing Goal: RH STG INCREASE KNOWLEDGE OF HYPERTENSION Description: Patient will be able to manage HTN with medications and dietary modifications  using handouts independently Outcome: Progressing Goal: RH STG INCREASE KNOWLEGDE OF HYPERLIPIDEMIA Description: Patient will be able to manage HLD with medications and dietary modifications  using handouts independently Outcome: Progressing Goal: RH STG INCREASE KNOWLEDGE OF STROKE PROPHYLAXIS Description: Patient will be able to manage secondary stroke risks with medications and dietary modifications  using handouts independently Outcome: Progressing   Problem: RH Vision Goal: RH LTG Vision (Specify) Outcome: Progressing

## 2021-03-01 ENCOUNTER — Inpatient Hospital Stay (HOSPITAL_COMMUNITY): Payer: No Typology Code available for payment source

## 2021-03-01 DIAGNOSIS — Z8673 Personal history of transient ischemic attack (TIA), and cerebral infarction without residual deficits: Secondary | ICD-10-CM | POA: Diagnosis not present

## 2021-03-01 DIAGNOSIS — I10 Essential (primary) hypertension: Secondary | ICD-10-CM

## 2021-03-01 DIAGNOSIS — R7401 Elevation of levels of liver transaminase levels: Secondary | ICD-10-CM

## 2021-03-01 DIAGNOSIS — I63441 Cerebral infarction due to embolism of right cerebellar artery: Secondary | ICD-10-CM | POA: Diagnosis not present

## 2021-03-01 DIAGNOSIS — D62 Acute posthemorrhagic anemia: Secondary | ICD-10-CM

## 2021-03-01 LAB — CBC WITH DIFFERENTIAL/PLATELET
Abs Immature Granulocytes: 0.01 10*3/uL (ref 0.00–0.07)
Basophils Absolute: 0 10*3/uL (ref 0.0–0.1)
Basophils Relative: 1 %
Eosinophils Absolute: 0.3 10*3/uL (ref 0.0–0.5)
Eosinophils Relative: 7 %
HCT: 33.9 % — ABNORMAL LOW (ref 36.0–46.0)
Hemoglobin: 11.1 g/dL — ABNORMAL LOW (ref 12.0–15.0)
Immature Granulocytes: 0 %
Lymphocytes Relative: 37 %
Lymphs Abs: 1.9 10*3/uL (ref 0.7–4.0)
MCH: 29.1 pg (ref 26.0–34.0)
MCHC: 32.7 g/dL (ref 30.0–36.0)
MCV: 89 fL (ref 80.0–100.0)
Monocytes Absolute: 0.4 10*3/uL (ref 0.1–1.0)
Monocytes Relative: 7 %
Neutro Abs: 2.5 10*3/uL (ref 1.7–7.7)
Neutrophils Relative %: 48 %
Platelets: 214 10*3/uL (ref 150–400)
RBC: 3.81 MIL/uL — ABNORMAL LOW (ref 3.87–5.11)
RDW: 12.6 % (ref 11.5–15.5)
WBC: 5.1 10*3/uL (ref 4.0–10.5)
nRBC: 0 % (ref 0.0–0.2)

## 2021-03-01 LAB — COMPREHENSIVE METABOLIC PANEL
ALT: 47 U/L — ABNORMAL HIGH (ref 0–44)
AST: 56 U/L — ABNORMAL HIGH (ref 15–41)
Albumin: 3 g/dL — ABNORMAL LOW (ref 3.5–5.0)
Alkaline Phosphatase: 59 U/L (ref 38–126)
Anion gap: 5 (ref 5–15)
BUN: 16 mg/dL (ref 6–20)
CO2: 26 mmol/L (ref 22–32)
Calcium: 8.7 mg/dL — ABNORMAL LOW (ref 8.9–10.3)
Chloride: 105 mmol/L (ref 98–111)
Creatinine, Ser: 0.62 mg/dL (ref 0.44–1.00)
GFR, Estimated: >60 mL/min (ref >60)
Glucose, Bld: 106 mg/dL — ABNORMAL HIGH (ref 70–99)
Potassium: 4 mmol/L (ref 3.5–5.1)
Sodium: 136 mmol/L (ref 135–145)
Total Bilirubin: 1.2 mg/dL (ref 0.3–1.2)
Total Protein: 6.8 g/dL (ref 6.5–8.1)

## 2021-03-01 LAB — FACTOR 5 LEIDEN

## 2021-03-01 MED ORDER — AMLODIPINE BESYLATE 5 MG PO TABS
5.0000 mg | ORAL_TABLET | Freq: Every day | ORAL | Status: DC
  Administered 2021-03-01 – 2021-03-16 (×16): 5 mg via ORAL
  Filled 2021-03-01 (×16): qty 1

## 2021-03-01 NOTE — Progress Notes (Signed)
Cranial CT scan completed due to some increased dizziness associated with therapies that showed increased edema associated with the right cerebellar and right dorsal midbrain infarcts with some interval development of acute hemorrhage at the site of right cerebellar infarct.  Some increase resulting mass-effect on the fourth ventricle without hydrocephalus.  Neurology services consulted in regards to these new findings awaiting plan of care.

## 2021-03-01 NOTE — Progress Notes (Signed)
Patient ID: Naysha Sholl, female   DOB: [birth year], 60 y.o.   MRN: [id number] Met with the patient and spouse to review role of the nurse CM and introduce education for discharge and management of secondary stroke risks. Reviewed risks including HTN, HLD (# look good on lipitor) and prediabetes with a vegetarian diet. Discussed carbohydrate counting and dietary modifications. Given handouts on foods higher in calcium, magnesium, etc. Also reviewed ASA with Brinlinta for 4 weeks then solo per MD. Spouse is concerned about discharge as he works long hours and has no one to be with the patient at home in 2 level 4 STE and flight up to bedroom. The only thing for her to sleep/rest on downstairs is a couch at present and 1/2 bath. Reviewed team conference schedule and plan for therapy. Continue to follow along to discharge to address educational needs and collaborate with the SW to facilitate discharge preparation. Margarito Liner

## 2021-03-01 NOTE — Progress Notes (Signed)
Patient informed nurse that she felt dizzy. Nurse assessed patient. As nurse proceeded to in [place], nurse was called back to room and patient presented to have vomited on the bedside table. Patient stated that she still felt dizzy. Patient vitals assessed and stable. PA informed. Patient able to perform therapy. Therapy notified. Therapy informed nurse that patient sat up at edge of bed and had increased dizziness and felt as though she wanted to vomit. Therapy stated that patient was placed back to bed and assessed. Therapy stated that patient's eyes presented unsteady and rapidly moved back and forth. Vitals signs and patient assessed again. Vitals stable and patient still presented dizzy. PA again notified and STAT CT ordered. Awaiting transport to CT. Cletis Media, LPN

## 2021-03-01 NOTE — Plan of Care (Signed)
  Problem: Consults Goal: RH STROKE PATIENT EDUCATION Description: See Patient Education module for education specifics  Outcome: Progressing   Problem: RH SKIN INTEGRITY Goal: RH STG SKIN FREE OF INFECTION/BREAKDOWN Description: Manage skin and keep infection at bay With min assist Outcome: Progressing Goal: RH STG ABLE TO PERFORM INCISION/WOUND CARE W/ASSISTANCE Description: STG Able To Perform Incision/Wound Care With min  Assistance. Outcome: Progressing   Problem: RH SAFETY Goal: RH STG ADHERE TO SAFETY PRECAUTIONS W/ASSISTANCE/DEVICE Description: STG Adhere to Safety Precautions With cues/reminders Assistance/Device. Outcome: Progressing   Problem: RH PAIN MANAGEMENT Goal: RH STG PAIN MANAGED AT OR BELOW PT'S PAIN GOAL Description: At or below level 4 Outcome: Progressing   Problem: RH KNOWLEDGE DEFICIT Goal: RH STG INCREASE KNOWLEDGE OF DIABETES Description: Patient will be able to manage prediabetes with dietary modifications using handouts and educational materials independently Outcome: Progressing Goal: RH STG INCREASE KNOWLEDGE OF HYPERTENSION Description: Patient will be able to manage HTN with medications and dietary modifications  using handouts independently Outcome: Progressing Goal: RH STG INCREASE KNOWLEGDE OF HYPERLIPIDEMIA Description: Patient will be able to manage HLD with medications and dietary modifications  using handouts independently Outcome: Progressing Goal: RH STG INCREASE KNOWLEDGE OF STROKE PROPHYLAXIS Description: Patient will be able to manage secondary stroke risks with medications and dietary modifications  using handouts independently Outcome: Progressing   Problem: RH Vision Goal: RH LTG Vision (Specify) Outcome: Progressing   

## 2021-03-01 NOTE — Consult Note (Signed)
Stroke Neurology Consultation Note  Consult Requested by: Dr. Allena Katz  Reason for Consult: Acute onset dizziness  Consult Date: 03/01/21   The history was obtained from the patient.  During history and examination, all items were able to obtain unless otherwise noted.  History of Present Illness:  Judy Gutierrez is a 60 y.o. Asian female with PMH of recurrent stroke, hypertension admitted on 02/26/2021 for rehab due to right cerebellar infarct.  Per chart, patient had left cerebellar stroke in 01/2020 status post tPA admitted to [facility].  At that time, CTA head and neck no LVO or stenosis, MRI showed left superior cerebellar infarct.  EF normal range.  TCD bubble study negative.  LDL 138, A1c 6.0.  Hypercoagulable work-up negative.  Bilateral lower extremity DVT negative.  Discharged with DAPT for 3 weeks and then aspirin alone, as well as Lipitor 40.  After discharge she follow-up with Dr. [name] at [facility], had loop recorder placed in 02/2020.  On 02/23/2021 patient was readmitted for acute onset vertigo, nausea vomiting.  CT showed early ischemic changes at right cerebellum.  MRI confirmed right superior cerebellum moderate infarct and punctate dorsal midbrain infarct.  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  Loop recorder interrogation negative.  LDL 51, A1c 6.0. echo pending.  Hypercoagulable work-up repeated again negative.  She will put on aspirin 81 and the Brilinta 90 twice daily for 4 weeks and then Brilinta alone, continue on Lipitor 40, and she was discharged to CIR on 02/26/2021.  Per patient, she had no more vertigo or nausea vomiting on discharge to CIR.  She has been working with therapist pretty well until this afternoon around 1pm, she had a sudden onset vertigo with room spinning and vomited once.  BP 140s-150s, higher than her baseline.  She had stat CT head showed increased left superior cerebellum edema with acute hemorrhagic  conversion with mass-effect to fourth ventricle but no hydrocephalus.  Patient currently lying in bed, stated that her vertigo has improved, however still has double vision with left gaze.  On exam, she still has nystagmus on right gaze with direction to the right.  LSN: 1pm tPA Given: No: Recent stroke, hemorrhagic conversion.  Past Medical History:  Diagnosis Date  . Disturbance of skin sensation   . Myalgia and myositis, unspecified   . Other B-complex deficiencies   . 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 Stroke (HCC)   . Unspecified essential hypertension     Past Surgical History:  Procedure Laterality Date  . BUBBLE STUDY  03/05/2020   Procedure: BUBBLE STUDY;  Surgeon: [name], MD;  Location: [facility] ENDOSCOPY;  Service: Cardiovascular;;  . CESAREAN SECTION  1992  . LOOP RECORDER INSERTION N/A 03/05/2020   Procedure: LOOP RECORDER INSERTION;  Surgeon: [name], MD;  Location: [facility] INVASIVE CV LAB;  Service: Cardiovascular;  Laterality: N/A;  . TEE WITHOUT CARDIOVERSION N/A 03/05/2020   Procedure: TRANSESOPHAGEAL ECHOCARDIOGRAM (TEE);  Surgeon: [name], MD;  Location: [facility] ENDOSCOPY;  Service: Cardiovascular;  Laterality: N/A;    Family History  Problem Relation Age of Onset  . Pulmonary embolism Mother   . Hepatitis Father   . Diabetes Father   . Diabetes Mellitus II Sister   . Breast cancer Neg Hx     Social History:  reports that she has never smoked. She has never used smokeless tobacco. She reports that she does not drink alcohol and  does not use drugs.  Allergies: No Known Allergies  No current facility-administered medications on file prior to encounter.   Current Outpatient Medications on File Prior to Encounter  Medication Sig Dispense Refill  . amLODipine (NORVASC) 5 MG tablet Take 1 tablet (5 mg total) by mouth daily. 90 tablet 3  . aspirin 81 MG EC tablet Take 1 tablet (81 mg total) by mouth daily. 90 tablet 1  .  atorvastatin (LIPITOR) 40 MG tablet Take 1 tablet (40 mg total) by mouth daily. 90 tablet 3  . Calcium-Magnesium-Vitamin D (CALCIUM MAGNESIUM PO) Take by mouth daily.    Marland Kitchen. losartan (COZAAR) 50 MG tablet Take 1 tablet (50 mg total) by mouth daily. 90 tablet 3  . Multiple Minerals-Vitamins (MULTISOURCE CALCIUM MAG/D) TABS Take 1 tablet by mouth daily.    . Multiple Vitamin (MULTIVITAMIN WITH MINERALS) TABS tablet Take 1 tablet by mouth daily.      Review of Systems: A full ROS was attempted today and was able to be performed.  Systems assessed include - Constitutional, Eyes, HENT, Respiratory, Cardiovascular, Gastrointestinal, Genitourinary, Integument/breast, Hematologic/lymphatic, Musculoskeletal, Neurological, Behavioral/Psych, Endocrine, Allergic/Immunologic - with pertinent responses as per HPI.  Physical Examination: Temp:  [98.2 F (36.8 C)-99.4 F (37.4 C)] 98.2 F (36.8 C) (05/16 1434) Pulse Rate:  [75-102] 76 (05/16 1434) Resp:  [16-18] 18 (05/16 1434) BP: (118-156)/(73-91) 152/85 (05/16 1434) SpO2:  [98 %-100 %] 99 % (05/16 1434)  General - well nourished, well developed, in no apparent distress.    Ophthalmologic - fundi not visualized due to noncooperation.    Cardiovascular - regular rhythm and rate  Mental Status -  Level of arousal and orientation to time, place, and person were intact. Language including expression, naming, repetition, comprehension, reading, and writing was assessed and found intact except mild dysarthria and intermittent paraphasic errors Fund of Knowledge was assessed and was intact.  Cranial Nerves II - XII - II - Vision intact OU. III, IV, VI - Extraocular movements intact however subjective left gaze and downward gaze diplopia. V - Facial sensation intact bilaterally. VII - Facial movement intact bilaterally. VIII - nystagmus on right and upward gaze with direction to the right X - Palate elevates symmetrically. XI - Chin turning & shoulder  shrug intact bilaterally. XII - Tongue protrusion intact.  Motor Strength - The patient's strength was normal in all extremities and pronator drift was absent.   Motor Tone & Bulk - Muscle tone was assessed at the neck and appendages and was normal.  Bulk was normal and fasciculations were absent.   Reflexes - The patient's reflexes were normal in all extremities and she had no pathological reflexes.  Sensory - Light touch, temperature/pinprick were assessed and were normal.    Coordination - The patient had normal movements in the left hand and feet with no ataxia or dysmetria.  However right finger-to-nose dysmetria.  Tremor was absent.  Gait and Station - deferred  Data Reviewed: CT HEAD WO CONTRAST  Result Date: 03/01/2021 CLINICAL DATA:  Stroke follow-up. EXAM: CT HEAD WITHOUT CONTRAST TECHNIQUE: Contiguous axial images were obtained from the base of the skull through the vertex without intravenous contrast. COMPARISON:  CT head and MRI Feb 23, 2021. FINDINGS: Brain: Increased edema associated with the right cerebellar and right dorsal midbrain infarct. Increased resulting mass effect on the fourth ventricle without hydrocephalus. Interval development of acute hemorrhage at the site of right cerebellar infarct. No evidence of new/interval acute large vascular territory infarct. Remote left cerebellar  infarct. Basal cisterns are patent. No inferior cerebellar tonsillar herniation or midline shift. No mass lesion. Vascular: No hyperdense vessel identified. Skull: No acute fracture. Sinuses/Orbits: Mucosal thickening the left maxillary sinus, right posterior ethmoid air cell and left sphenoid sinus with frothy secretions in the sinuses. Unremarkable orbits. Other: No mastoid effusions. IMPRESSION: 1. Increased edema associated with the right cerebellar and right dorsal midbrain infarcts with interval development of acute hemorrhage at the site of right cerebellar infarct. Increased resulting mass  effect on the fourth ventricle without hydrocephalus. 2. Paranasal sinus mucosal thickening with frothy secretions, as detailed above. Electronically Signed   By: Feliberto Harts MD   On: 03/01/2021 14:39   MR BRAIN WO CONTRAST  Result Date: 02/23/2021 CLINICAL DATA:  Dizziness, nausea and vomiting. EXAM: MRI HEAD WITHOUT CONTRAST TECHNIQUE: Multiplanar, multiecho pulse sequences of the brain and surrounding structures were obtained without intravenous contrast. COMPARISON:  CT studies same day FINDINGS: Brain: Diffusion imaging shows a 4-5 cm region of acute infarction within the superior cerebellum on the right. Acute infarction also within the dorsal right midbrain. Old infarction within the left superior cerebellum. Mild swelling but no acute hemorrhage in the right cerebellum. Old hemosiderin residual within the left cerebellar stroke. Cerebral hemispheres are normal. No mass, hydrocephalus or extra-axial collection. Vascular: No acute vascular finding by standard MRI. Skull and upper cervical spine: Negative Sinuses/Orbits: Mucosal inflammatory changes of the left maxillary and sphenoid sinuses. Orbits negative. Other: None IMPRESSION: 4-5 cm region of acute infarction within the superior cerebellum on the right. Punctate acute infarction in the right dorsal midbrain. Mild swelling. No evidence of acute hemorrhage. Old infarction in the left superior cerebellum with atrophy, encephalomalacia and gliosis and hemosiderin deposition. Electronically Signed   By: Paulina Fusi M.D.   On: 02/23/2021 08:42   CUP PACEART REMOTE DEVICE CHECK  Result Date: 02/09/2021 ILR summary report received. Battery status OK. Normal device function. No new symptom, tachy, brady, or pause episodes. No new AF episodes. Monthly summary reports and ROV/PRN.  RP  CT HEAD CODE STROKE WO CONTRAST  Result Date: 02/23/2021 CLINICAL DATA:  Code stroke. Initial evaluation for neuro deficit, stroke suspected. EXAM: CT HEAD WITHOUT  CONTRAST TECHNIQUE: Contiguous axial images were obtained from the base of the skull through the vertex without intravenous contrast. COMPARISON:  Prior CT from 01/31/2020. FINDINGS: Brain: Streak/beam hardening artifact emanating from the left occipital calvarium noted, mildly limiting assessment. Cerebral volume within normal limits. Focal encephalomalacia at the central and left superior cerebellum, consistent with a chronic left MCA distribution infarct. Subtle evolving hypodensity noted involving the superior right cerebellum and cerebellar vermis, concerning for an acute right SCA distribution infarct (series 3, image 12, 11). No associated hemorrhage or mass effect. No other evidence for acute large vessel territory infarct. No intracranial hemorrhage. No mass lesion, mass effect or midline shift. No hydrocephalus or extra-axial fluid collection. Vascular: No hyperdense vessel. Skull: Scalp soft tissues demonstrate no acute finding. Small focal exostosis noted at the right frontal calvarium. Calvarium intact. Sinuses/Orbits: Globes and orbital soft tissues within normal limits. Moderate left sphenoid and maxillary sinusitis. Additional scattered pneumatized secretions noted within the ethmoidal air cells. Subcentimeter osteoma noted within the right ethmoidal air cells as well. Mastoid air cells are clear. Other: None. ASPECTS [facility] Stroke Program Early CT Score) - Ganglionic level infarction (caudate, lentiform nuclei, internal capsule, insula, M1-M3 cortex): 7 - Supraganglionic infarction (M4-M6 cortex): 3 Total score (0-10 with 10 being normal): 10 IMPRESSION: 1. Evolving hypodensity  involving the superior right cerebellum/cerebellar vermis, concerning for an acute right SCA distribution infarct. No intracranial hemorrhage or mass effect. 2. ASPECTS is 10. 3. Underlying chronic left SCA distribution infarcts. Critical Value/emergent results were called by telephone at the time of interpretation on  02/23/2021 at 12:55 a.m. to provider Dr. Amada Jupiter, Who verbally acknowledged these results. Electronically Signed   By: Rise Mu M.D.   On: 02/23/2021 01:23   CT ANGIO HEAD NECK W WO CM (CODE STROKE)  Result Date: 02/23/2021 CLINICAL DATA:  Initial evaluation for neuro deficit, stroke suspected. EXAM: CT ANGIOGRAPHY HEAD AND NECK TECHNIQUE: Multidetector CT imaging of the head and neck was performed using the standard protocol during bolus administration of intravenous contrast. Multiplanar CT image reconstructions and MIPs were obtained to evaluate the vascular anatomy. Carotid stenosis measurements (when applicable) are obtained utilizing NASCET criteria, using the distal internal carotid diameter as the denominator. CONTRAST:  48mL OMNIPAQUE IOHEXOL 350 MG/ML SOLN COMPARISON:  Prior head CT from earlier the same day. FINDINGS: CTA NECK FINDINGS Aortic arch: Visualized aortic arch normal in caliber with normal branch pattern. No stenosis about the origin of the great vessels. Right carotid system: Right common carotid artery patent from its origin to the bifurcation without stenosis. Minimal plaque about the right carotid bulb without stenosis. Right ICA patent distally without stenosis, dissection or occlusion. Left carotid system: Left CCA patent from its origin to the bifurcation without stenosis. Minimal plaque about the left carotid bulb without significant stenosis. Left ICA patent distally without stenosis, dissection or occlusion. Vertebral arteries: Left vertebral artery arises directly from the aortic arch. Right vertebral artery dominant. Vertebral arteries widely patent within the neck. Just prior to the skull base/dural reflection, there is distal right V3 segment (series 9, image 83), suspicious for a possible short-segment dissection. Small amount of probable intraluminal thrombus protrudes into the vascular lumen at this level. No frank raised dissection flap is seen. No more than  mild stenosis at this level. The right vertebral artery is relatively normal just distally as it crosses into the cranial vault. Skeleton: No acute osseous finding. No discrete or worrisome osseous lesions. Mild cervical spondylosis noted at C5-6 without significant stenosis. Other neck: No other acute soft tissue abnormality within the neck. No mass or adenopathy. Upper chest: Visualized upper chest demonstrates no acute finding. Review of the MIP images confirms the above findings CTA HEAD FINDINGS Anterior circulation: Petrous, cavernous, and supraclinoid segments patent without stenosis or other abnormality. A1 segments widely patent. Normal anterior communicating artery complex. Anterior cerebral arteries patent to their distal aspects without stenosis. No M1 stenosis or occlusion. Normal MCA bifurcations. Distal MCA branches well perfused and symmetric. Posterior circulation: Both V4 segments patent to the vertebrobasilar junction without stenosis. Both PICA origins patent and normal. Basilar somewhat diminutive but patent to its distal aspect without stenosis. Left superior cerebral artery patent. There is occlusion of the mid-distal right superior cerebellar artery, likely embolic (series 8, image 105). Left PCA supplied via the basilar as well as a prominent left posterior communicating artery. Predominant fetal type origin of the right PCA. Both PCAs remain widely patent to their distal aspects. Venous sinuses: Grossly patent allowing for timing the contrast bolus. Anatomic variants: Predominant fetal type origin of the right PCA. Review of the MIP images confirms the above findings IMPRESSION: 1. Focal intimal irregularity involving the distal right V3 segment, likely reflecting a short-segment dissection. No frank raised dissection flap or significant luminal narrowing. 2. Downstream occlusion of  the mid-distal right superior cerebellar artery, likely embolic. 3. Mild for age atheromatous disease  elsewhere about the major arterial vasculature of the head and neck. No other hemodynamically significant or correctable stenosis. These results were communicated to Dr. Amada Jupiter at 1:22 amon 5/10/2022by text page via the [facility] messaging system. Electronically Signed   By: Rise Mu M.D.   On: 02/23/2021 01:38    Assessment: 60 y.o. female with hx of recurrent stroke, hypertension admitted on 02/26/2021 for rehab due to right cerebellar infarct. She has been working with therapist pretty well resolved vertigo or N/V.  This afternoon at 1 PM patient had sudden onset vertigo with room spinning and vomited once. Stat CT head showed increased left superior cerebellum edema with acute hemorrhagic conversion with mass-effect to fourth ventricle but no hydrocephalus.  Patient currently still has double vision with left gaze as well as nystagmus on right gaze with direction to the right.  Patient not a tPA candidate given recent stroke and hemorrhagic conversion.  Although hemorrhagic conversion looks more subacute, however patient has acute onset of symptoms cannot rule out acute hemorrhagic conversion.  Recommend to DC Brilinta, keep aspirin 81.  BP goal less than 160.  If neuro changes, repeat CT stat, otherwise repeat CT in 2 days to make sure no hydrocephalus.  Plan:  DC Brilinta, continue aspirin 81  BP monitoring, BP goal less than 160  Resume amlodipine   Repeat CT stat if neuro changes  Repeat CT Wednesday to rule out hydrocephalus  Continue PT/OT/speech  Will follow  Thank you for this consultation and allowing [place] to participate in the care of this patient.  [name], MD PhD Stroke Neurology 03/01/2021 3:54 PM

## 2021-03-01 NOTE — Progress Notes (Signed)
Physical Therapy Session Note  Patient Details  Name: Judy Gutierrez MRN: [id number] Date of Birth: 08-08-1961  Today's Date: 03/01/2021 PT Individual Time: 1300-1325 PT Individual Time Calculation (min): 25 min   Short Term Goals: Week 1:  PT Short Term Goal 1 (Week 1): Patient will complete bed<> wc transfers with LRAD and CGA consistently PT Short Term Goal 2 (Week 1): Patient will ambulate at least 121ft with LRAD and CGA consistently PT Short Term Goal 3 (Week 1): Patient will improve BBS >5 pts  Skilled Therapeutic Interventions/Progress Updates:    Pt received supine in bed at start of session with RN at bedside. Pt with emesis bag at mouth and RN assessing BP. BP reading 156/82. Pt reports the room is spinning. Rates dizziness 10/10. Bilateral nystagmus present in horizontal direction. RN reports that PA is aware and okay to participate in therapies. Pt reports these symptoms are new and began ~15 minutes ago. Per evaluating PT over the weekend, pt was not experiencing nystagmus or N/V. Supine<>sit with supervision and able to sit unsupported with supervision. Dizziness worse in standing > sitting > supine. Time provided for upright acclimation and requires minA for sit<>stand but unable to stand >15 seconds due to increased nausea. Pt requesting to lay back down and rest, able to do so with supervision. Bed alarm set, needs within reach, RN and PA notified after session of pt's response to activity. Pt missed 35 minutes of skilled therapy due to nausea/vommitting.  Therapy Documentation Precautions:  Precautions Precautions: Fall Precaution Comments: Diplopia, trunk/RUE/LE ataxic Restrictions Weight Bearing Restrictions: No General: PT Amount of Missed Time (min): 35 Minutes PT Missed Treatment Reason: Patient ill (Comment);Other (Comment) (N/V)  Therapy/Group: Individual Therapy  Orrin Brigham 03/01/2021, 7:49 AM

## 2021-03-01 NOTE — Progress Notes (Signed)
Occupational Therapy Session Note  Patient Details  Name: Judy Gutierrez MRN: [id number] Date of Birth: 1961-01-11  Today's Date: 03/01/2021 OT Individual Time: 0902-1000 OT Individual Time Calculation (min): 58 min    Short Term Goals: Week 1:  OT Short Term Goal 1 (Week 1): Pt will complete toileting tasks at CGA level OT Short Term Goal 2 (Week 1): Pt will don pants with CGA OT Short Term Goal 3 (Week 1): Pt will utilize appropriate visual compensation strategies during ADL routine with min cueing OT Short Term Goal 4 (Week 1): Pt will transfer to walk in shower with min A  Skilled Therapeutic Interventions/Progress Updates:    Pt received in bed and consented to OT tx. Pt seen for ADL session including bathing, toileting, dressing, and functional transfers and mobility. Pt completed functional transfers with CGA and cuing for proper hand placement before sitting down with poor carryover, toileted with SUP, bathed with setup, UBD with setup, and LBD with supervision. Pt taken down to gym, instructed in BITs board for coordination, visual pursuits and scanning, and dynamic standing balance. Pt demo's mild coordination deficits when using right hand, tends to over/undershoot targets when instructed to use R pointer finger. Pt instructed to alternate hands while standing with RW to increase balance when reaching outside of BOS. Remainder of session utilized to instruct in BUE strengthening HEP with 2# dowel rod. Pt trained in shoulder press, shoulder flexion, chest press, and elbow flexion for 3x10 with min cuing for proper tech with good carryover. After tx, pt helped back to room and back to bed with alarm on and all needs met.   Therapy Documentation Precautions:  Precautions Precautions: Fall Precaution Comments: Diplopia, trunk/RUE/LE ataxic Restrictions Weight Bearing Restrictions: No   Pain: Pain Assessment Pain Scale: 0-10 Pain Score: 0-No pain   Therapy/Group: Individual  Therapy  Janus Vlcek 03/01/2021, 9:47 AM

## 2021-03-01 NOTE — Progress Notes (Signed)
PROGRESS NOTE   Subjective/Complaints: Patient seen sitting up in bed this AM, eating breakfast.  She states she slept well overnight. She states she had a good weekend.  She is awaiting therapies.   ROS: Denies CP, SOB, N/V/D  Objective:   No results found. Recent Labs    03/01/21 0500  WBC 5.1  HGB 11.1*  HCT 33.9*  PLT 214   Recent Labs    03/01/21 0500  NA 136  K 4.0  CL 105  CO2 26  GLUCOSE 106*  BUN 16  CREATININE 0.62  CALCIUM 8.7*    Intake/Output Summary (Last 24 hours) at 03/01/2021 1035 Last data filed at 03/01/2021 0901 Gross per 24 hour  Intake 240 ml  Output --  Net 240 ml        Physical Exam: Vital Signs Blood pressure 118/79, pulse 75, temperature 98.5 F (36.9 C), resp. rate 17, height 5\' 2"  (1.575 m), weight 60.2 kg, last menstrual period 12/08/2010, SpO2 100 %. Constitutional: No distress . Vital signs reviewed. HENT: Normocephalic.  Atraumatic. Eyes: EOMI. No discharge. Cardiovascular: No JVD.   Respiratory: Normal effort.  No stridor.   GI: Non-distended.   Skin: Warm and dry.  Intact. Psych: Normal mood.  Normal behavior. Musc: No edema in extremities.  No tenderness in extremities. Neuro: Alert Motor: 5/5 throughout  Assessment/Plan: 1. Functional deficits which require 3+ hours per day of interdisciplinary therapy in a comprehensive inpatient rehab setting.  Physiatrist is providing close team supervision and 24 hour management of active medical problems listed below.  Physiatrist and rehab team continue to assess barriers to discharge/monitor patient progress toward functional and medical goals  Care Tool:  Bathing    Body parts bathed by patient: Right arm,Right lower leg,Left arm,Chest,Abdomen,Front perineal area,Buttocks,Left upper leg,Left lower leg,Face,Right upper leg         Bathing assist Assist Level: Supervision/Verbal cueing     Upper Body  Dressing/Undressing Upper body dressing   What is the patient wearing?: Pull over shirt    Upper body assist Assist Level: Set up assist    Lower Body Dressing/Undressing Lower body dressing      What is the patient wearing?: Underwear/pull up,Pants     Lower body assist Assist for lower body dressing: Supervision/Verbal cueing     Toileting Toileting    Toileting assist Assist for toileting: Contact Guard/Touching assist     Transfers Chair/bed transfer  Transfers assist     Chair/bed transfer assist level: Minimal Assistance - Patient > 75%     Locomotion Ambulation   Ambulation assist      Assist level: Minimal Assistance - Patient > 75% Assistive device: Walker-rolling Max distance: 160   Walk 10 feet activity   Assist     Assist level: Minimal Assistance - Patient > 75% Assistive device: Walker-rolling   Walk 50 feet activity   Assist    Assist level: Minimal Assistance - Patient > 75% Assistive device: Walker-rolling    Walk 150 feet activity   Assist    Assist level: Minimal Assistance - Patient > 75% Assistive device: Walker-rolling    Walk 10 feet on uneven surface  activity  Assist Walk 10 feet on uneven surfaces activity did not occur: Safety/medical concerns         Wheelchair     Assist Will patient use wheelchair at discharge?: No             Wheelchair 50 feet with 2 turns activity    Assist            Wheelchair 150 feet activity     Assist          Blood pressure 118/79, pulse 75, temperature 98.5 F (36.9 C), resp. rate 17, height 5\' 2"  (1.575 m), weight 60.2 kg, last menstrual period 12/08/2010, SpO2 100 %.    Medical Problem List and Plan: 1.  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  Antithrombotics: -DVT/anticoagulation:  Lovenox             -antiplatelet therapy: Aspirin 81 mg daily and Brilinta 90 mg twice daily x4 weeks then Brilinta alone 3. Pain Management: Tylenol as needed             Monitor, particularly for headaches with increased exertion 4. Mood: Provide emotional support             -antipsychotic agents: N/A 5. Neuropsych: This patient is capable of making decisions on her own behalf. 6. Skin/Wound Care: Routine skin checks 7. Fluids/Electrolytes/Nutrition: Routine in and outs 8.  Hypertension.  Patient on Cozaar50 mg daily, Norvasc 5 mg daily prior to admission.               Vitals with BMI 03/01/2021 02/28/2021 02/28/2021  Height - - -  Weight - - -  BMI - - -  Systolic 118 130 03/02/2021  Diastolic 79 75 73  Pulse 75 79 102   Controlled on 5/16 9. Hyperlipidemia: Lipitor 10. ABLA             Hb 11.1 on 5/16  Cont to monitor 12. Transaminitis  LFTs elevated on 5/16  Cont to monitor  LOS: 3 days A FACE TO FACE EVALUATION WAS PERFORMED  Judy Gutierrez 6/16 03/01/2021, 10:35 AM

## 2021-03-01 NOTE — Progress Notes (Signed)
Inpatient Rehabilitation Center Individual Statement of Services  Patient Name:  Judy Gutierrez  Date:  03/01/2021  Welcome to the Inpatient Rehabilitation Center.  Our goal is to provide you with an individualized program based on your diagnosis and situation, designed to meet your specific needs.  With this comprehensive rehabilitation program, you will be expected to participate in at least 3 hours of rehabilitation therapies Monday-Friday, with modified therapy programming on the weekends.  Your rehabilitation program will include the following services:  Physical Therapy (PT), Occupational Therapy (OT), Speech Therapy (ST), 24 hour per day rehabilitation nursing, Therapeutic Recreaction (TR), Neuropsychology, Care Coordinator, Rehabilitation Medicine, Nutrition Services and Pharmacy Services  Weekly team conferences will be held on wednesday to discuss your progress.  Your Inpatient Rehabilitation Care Coordinator will talk with you frequently to get your input and to update you on team discussions.  Team conferences with you and your family in attendance may also be held.  Expected length of stay: 10-14 days  Overall anticipated outcome: supervision set up  Depending on your progress and recovery, your program may change. Your Inpatient Rehabilitation Care Coordinator will coordinate services and will keep you informed of any changes. Your Inpatient Rehabilitation Care Coordinator's name and contact numbers are listed  below.  The following services may also be recommended but are not provided by the Inpatient Rehabilitation Center:    Home Health Rehabiltiation Services  Outpatient Rehabilitation Services    Arrangements will be made to provide these services after discharge if needed.  Arrangements include referral to agencies that provide these services.  Your insurance has been verified to be:  [employer]-medicare Your primary doctor is:  Seabron Spates  Pertinent information will be  shared with your doctor and your insurance company.  Inpatient Rehabilitation Care Coordinator:  Dossie Der, [place] [phone] or [phone]  Information discussed with and copy given to patient by: Lucy Chris, 03/01/2021, 11:22 AM

## 2021-03-01 NOTE — Progress Notes (Signed)
Inpatient Rehabilitation Care Coordinator Assessment and Plan Patient Details  Name: Judy Gutierrez MRN: [id number] Date of Birth: [birth year]  Today's Date: 03/01/2021  Hospital Problems: Principal Problem:   Embolic stroke involving right cerebellar artery [facility]) Active Problems:   Transaminitis  Past Medical History:  Past Medical History:  Diagnosis Date  . Disturbance of skin sensation   . Myalgia and myositis, unspecified   . Other B-complex deficiencies   . 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 Stroke (HCC)   . Unspecified essential hypertension    Past Surgical History:  Past Surgical History:  Procedure Laterality Date  . BUBBLE STUDY  03/05/2020   Procedure: BUBBLE STUDY;  Surgeon: [name], MD;  Location: [facility] ENDOSCOPY;  Service: Cardiovascular;;  . CESAREAN SECTION  1992  . LOOP RECORDER INSERTION N/A 03/05/2020   Procedure: LOOP RECORDER INSERTION;  Surgeon: [name], MD;  Location: [facility] INVASIVE CV LAB;  Service: Cardiovascular;  Laterality: N/A;  . TEE WITHOUT CARDIOVERSION N/A 03/05/2020   Procedure: TRANSESOPHAGEAL ECHOCARDIOGRAM (TEE);  Surgeon: [name], MD;  Location: [facility] ENDOSCOPY;  Service: Cardiovascular;  Laterality: N/A;   Social History:  reports that she has never smoked. She has never used smokeless tobacco. She reports that she does not drink alcohol and does not use drugs.  Family / Support Systems Marital Status: Married Patient Roles: Spouse,Other (Comment) (sibling) Spouse/Significant Other: Ajay-husband [phone] Other Supports: sister will be here short time with pt Anticipated Caregiver: Husband and sister Ability/Limitations of Caregiver: Husband works 7a-5pm and sister may be here for two weeks need to confirm with her Caregiver Availability: 24/7 (very short time) Family Dynamics: Close with family and extended family-pt is hoping she will not need someone there with her all of the time. They  have friends who are supportive also  Social History Preferred language: English Religion: Hindu Cultural Background: From [place] been here many years Education: Automotive engineer Read: Yes Write: Yes Employment Status: Disabled Marine scientist Issues: No issues Guardian/Conservator: None-according to MD pt is capable of making her own decisions while here. Will include husband due to pt's preference   Abuse/Neglect Abuse/Neglect Assessment Can Be Completed: Yes Physical Abuse: Denies Verbal Abuse: Denies Sexual Abuse: Denies Exploitation of patient/patient's resources: Denies Self-Neglect: Denies  Emotional Status Pt's affect, behavior and adjustment status: Pt is motivated to improve and regain her independence. She was here in 2015 after pelvic fracture. She went directly home after CVA in 2021. She will do her best while here Recent Psychosocial Issues: other health issues past hx of CVA Psychiatric History: no history deferred depression screen due to appears to be coping appropriately and verbalizing. Do feel she would benefit from seeing neuro-psych while here Substance Abuse History: No issues  Patient / Family Perceptions, Expectations & Goals Pt/Family understanding of illness & functional limitations: Pt and husband can explain her stroke and deficits. She is bothered by her double vision but the tape on her glasses help. Both talk with the MD and feel their questions are being answered. Premorbid pt/family roles/activities: Wife, sister, freind, etc Anticipated changes in roles/activities/participation: resume Pt/family expectations/goals: Pt states: " I want to be able to do for myself like I was doing before this."  Husband states: " I hope she does well here. I have to work."  [employer]: None Premorbid Home Care/DME Agencies: Other (Comment) (rw and tub bench from past surgeries) Transportation available at discharge: husband Resource  referrals recommended: Neuropsychology  Discharge Planning  Living Arrangements: Spouse/significant other Support Systems: Spouse/significant other,Other relatives,Friends/neighbors,Church/faith community Type of Residence: Private residence Insurance Resources: Media planner (specify) Investment banker, operational) Financial Resources: [employer] Support Financial Screen Referred: No Living Expenses: Lives with family Money Management: Spouse Does the patient have any problems obtaining your medications?: No Home Management: pt was doing prior to admission Patient/Family Preliminary Plans: Return home with husband and sister for a short time. Husband works during the day and [place] business. She hopes her sister can stay and then will not need someone during the day. Team seesm to think she will need supervision at discharge but for how long Care Coordinator Barriers to Discharge: Insurance for SNF coverage,Decreased caregiver support Care Coordinator Anticipated Follow Up Needs: HH/OP  Clinical Impression Pleasant female who has been here a while ago in 2015. She is aware of the process but feels it has changed. Her husband and sister are involved and will assist at discharge, unsure for how long? Will work on safe discharge plan for pt and have neuro-psych see while here.  Lucy Chris 03/01/2021, 11:20 AM

## 2021-03-02 DIAGNOSIS — I63441 Cerebral infarction due to embolism of right cerebellar artery: Secondary | ICD-10-CM | POA: Diagnosis not present

## 2021-03-02 DIAGNOSIS — Z8673 Personal history of transient ischemic attack (TIA), and cerebral infarction without residual deficits: Secondary | ICD-10-CM | POA: Diagnosis not present

## 2021-03-02 DIAGNOSIS — H539 Unspecified visual disturbance: Secondary | ICD-10-CM

## 2021-03-02 DIAGNOSIS — I69398 Other sequelae of cerebral infarction: Secondary | ICD-10-CM

## 2021-03-02 NOTE — Progress Notes (Signed)
PROGRESS NOTE   Subjective/Complaints: Patient seen laying in bed this AM.  She states she slept well overnight.  She notes improvement in vision.  She has questions regarding stroke etiology. Discussed dysarthria with therapies. She had a CT yesterday due to increase in dizziness. She was seen by Neuro, notes reviewed - meds changed.   ROS: Denies CP, SOB, N/V/D  Objective:   CT HEAD WO CONTRAST  Result Date: 03/01/2021 CLINICAL DATA:  Stroke follow-up. EXAM: CT HEAD WITHOUT CONTRAST TECHNIQUE: Contiguous axial images were obtained from the base of the skull through the vertex without intravenous contrast. COMPARISON:  CT head and MRI Feb 23, 2021. FINDINGS: Brain: Increased edema associated with the right cerebellar and right dorsal midbrain infarct. Increased resulting mass effect on the fourth ventricle without hydrocephalus. Interval development of acute hemorrhage at the site of right cerebellar infarct. No evidence of new/interval acute large vascular territory infarct. Remote left cerebellar infarct. Basal cisterns are patent. No inferior cerebellar tonsillar herniation or midline shift. No mass lesion. Vascular: No hyperdense vessel identified. Skull: No acute fracture. Sinuses/Orbits: Mucosal thickening the left maxillary sinus, right posterior ethmoid air cell and left sphenoid sinus with frothy secretions in the sinuses. Unremarkable orbits. Other: No mastoid effusions. IMPRESSION: 1. Increased edema associated with the right cerebellar and right dorsal midbrain infarcts with interval development of acute hemorrhage at the site of right cerebellar infarct. Increased resulting mass effect on the fourth ventricle without hydrocephalus. 2. Paranasal sinus mucosal thickening with frothy secretions, as detailed above. Electronically Signed   By: Feliberto Harts MD   On: 03/01/2021 14:39   Recent Labs    03/01/21 0500  WBC 5.1  HGB  11.1*  HCT 33.9*  PLT 214   Recent Labs    03/01/21 0500  NA 136  K 4.0  CL 105  CO2 26  GLUCOSE 106*  BUN 16  CREATININE 0.62  CALCIUM 8.7*    Intake/Output Summary (Last 24 hours) at 03/02/2021 1126 Last data filed at 03/02/2021 0742 Gross per 24 hour  Intake 360 ml  Output --  Net 360 ml        Physical Exam: Vital Signs Blood pressure 130/87, pulse 94, temperature 98.7 F (37.1 C), resp. rate 16, height 5\' 2"  (1.575 m), weight 60.2 kg, last menstrual period 12/08/2010, SpO2 98 %.  Constitutional: No distress . Vital signs reviewed. HENT: Normocephalic.  Atraumatic. Eyes: EOMI. No discharge. Cardiovascular: No JVD.  RRR. Respiratory: Normal effort.  No stridor.  Bilateral clear to auscultation. GI: Non-distended.  BS +. Skin: Warm and dry.  Intact. Psych: Normal mood.  Normal behavior. Musc: No edema in extremities.  No tenderness in extremities. Neuro: Alert Motor: 5/5 throughout RUE dysmetria. Dysarthria  Assessment/Plan: 1. Functional deficits which require 3+ hours per day of interdisciplinary therapy in a comprehensive inpatient rehab setting.  Physiatrist is providing close team supervision and 24 hour management of active medical problems listed below.  Physiatrist and rehab team continue to assess barriers to discharge/monitor patient progress toward functional and medical goals  Care Tool:  Bathing    Body parts bathed by patient: Right arm,Right lower leg,Left arm,Chest,Abdomen,Front perineal area,Buttocks,Left upper leg,Left  lower leg,Face,Right upper leg         Bathing assist Assist Level: Supervision/Verbal cueing     Upper Body Dressing/Undressing Upper body dressing   What is the patient wearing?: Pull over shirt    Upper body assist Assist Level: Set up assist    Lower Body Dressing/Undressing Lower body dressing      What is the patient wearing?: Underwear/pull up,Pants     Lower body assist Assist for lower body  dressing: Supervision/Verbal cueing     Toileting Toileting    Toileting assist Assist for toileting: Contact Guard/Touching assist     Transfers Chair/bed transfer  Transfers assist     Chair/bed transfer assist level: Minimal Assistance - Patient > 75%     Locomotion Ambulation   Ambulation assist      Assist level: Minimal Assistance - Patient > 75% Assistive device: Walker-rolling Max distance: 160   Walk 10 feet activity   Assist     Assist level: Minimal Assistance - Patient > 75% Assistive device: Walker-rolling   Walk 50 feet activity   Assist    Assist level: Minimal Assistance - Patient > 75% Assistive device: Walker-rolling    Walk 150 feet activity   Assist    Assist level: Minimal Assistance - Patient > 75% Assistive device: Walker-rolling    Walk 10 feet on uneven surface  activity   Assist Walk 10 feet on uneven surfaces activity did not occur: Safety/medical concerns         Wheelchair     Assist Will patient use wheelchair at discharge?: No             Wheelchair 50 feet with 2 turns activity    Assist            Wheelchair 150 feet activity     Assist           Medical Problem List and Plan: 1.  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 Increased edema associated with the right cerebellar and right dorsal midbrain infarcts with interval development of acute hemorrhage at the site of right cerebellar infarct. Increased resulting mass effect on the fourth ventricle without hydrocephalus. 2. Paranasal sinus mucosal thickening with frothy secretions.  Plan for repeat CT tomorrow  Appreciate Neuro recs.  Continue CIR 2.  Antithrombotics: -DVT/anticoagulation: Lovenox             -antiplatelet therapy: Aspirin 81 mg daily, Brilinta d/ced 3.  Pain Management: Tylenol as needed             Monitor, particularly for headaches with increased exertion  Controlled on 5/17 4. Mood: Provide emotional support             -antipsychotic agents: N/A 5. Neuropsych: This patient is capable of making decisions on her own behalf. 6. Skin/Wound Care: Routine skin checks 7. Fluids/Electrolytes/Nutrition: Routine in and outs 8.  Hypertension.  Patient on Cozaar50 mg daily, Norvasc 5 mg daily prior to admission.    Norvasc 5 started on 5/16             Vitals with BMI 03/02/2021 03/02/2021 03/01/2021  Height - - -  Weight - - -  BMI - - -  Systolic 130 129 979  Diastolic 87 74 77  Pulse - 94 76   Relatively controlled on 5/17  9. Hyperlipidemia: Lipitor 10. ABLA             Hb 11.1 on 5/16  Cont to monitor 12. Transaminitis  LFTs elevated on 5/16  Cont to monitor 13. Dysarthria  SLP ordered  LOS: 4 days A FACE TO FACE EVALUATION WAS PERFORMED  Judy Gutierrez 03/02/2021, 11:26 AM

## 2021-03-02 NOTE — Addendum Note (Signed)
Addended by: Garreth Burnsworth D on: 03/02/2021 03:32 PM   Modules accepted: Level of Service  

## 2021-03-02 NOTE — Progress Notes (Signed)
STROKE TEAM PROGRESS NOTE   SUBJECTIVE (INTERVAL HISTORY) No family is at the bedside.  Overall her condition is rapidly improving.  Patient lying in bed, was in sleeping initially, but easily arousable.  She denies any dizziness or nausea vomiting today.  She feels good and worked with PT/OT well today.  We will repeat CT tomorrow.   OBJECTIVE Temp:  [98.2 F (36.8 C)-98.7 F (37.1 C)] 98.7 F (37.1 C) (05/17 0500) Pulse Rate:  [76-94] 94 (05/17 0500) Resp:  [16-18] 16 (05/17 0500) BP: (129-152)/(74-87) 130/87 (05/17 0918) SpO2:  [98 %-100 %] 98 % (05/17 0500)  No results for input(s): GLUCAP in the last 168 hours. Recent Labs  Lab 02/25/21 0409 02/26/21 0132 03/01/21 0500  NA 137 137 136  K 3.1* 4.3 4.0  CL 106 105 105  CO2 GLUCOSE 118* 118* 106*  BUN CREATININE 0.63 0.61 0.62  CALCIUM 8.6* 9.0 8.7*  MG 2.0  --   --    Recent Labs  Lab 03/01/21 0500  AST 56*  ALT 47*  ALKPHOS 59  BILITOT 1.2  PROT 6.8  ALBUMIN 3.0*   Recent Labs  Lab 02/25/21 0409 03/01/21 0500  WBC 6.4 5.1  NEUTROABS  --  2.5  HGB 11.3* 11.1*  HCT 33.7* 33.9*  MCV 86.2 89.0  PLT 205 214   No results for input(s): CKTOTAL, CKMB, CKMBINDEX, TROPONINI in the last 168 hours. No results for input(s): LABPROT, INR in the last 72 hours. No results for input(s): COLORURINE, LABSPEC, PHURINE, GLUCOSEU, HGBUR, BILIRUBINUR, KETONESUR, PROTEINUR, UROBILINOGEN, NITRITE, LEUKOCYTESUR in the last 72 hours.  Invalid input(s): APPERANCEUR     Component Value Date/Time   CHOL 105 02/23/2021 0745   CHOL 191 09/09/2015 0804   TRIG 28 02/23/2021 0745   HDL 48 02/23/2021 0745   HDL 60 09/09/2015 0804   CHOLHDL 2.2 02/23/2021 0745   VLDL 6 02/23/2021 0745   LDLCALC 51 02/23/2021 0745   LDLCALC 120 (H) 09/09/2015 0804   Lab Results  Component Value Date   HGBA1C 6.0 (H) 02/23/2021      Component Value Date/Time   LABOPIA NONE DETECTED 02/23/2021 0033   COCAINSCRNUR NONE  DETECTED 02/23/2021 0033   LABBENZ NONE DETECTED 02/23/2021 0033   AMPHETMU NONE DETECTED 02/23/2021 0033   THCU NONE DETECTED 02/23/2021 0033   LABBARB NONE DETECTED 02/23/2021 0033    No results for input(s): ETH in the last 168 hours.  I have personally reviewed the radiological images below and agree with the radiology interpretations.  CT HEAD WO CONTRAST  Result Date: 03/01/2021 CLINICAL DATA:  Stroke follow-up. EXAM: CT HEAD WITHOUT CONTRAST TECHNIQUE: Contiguous axial images were obtained from the base of the skull through the vertex without intravenous contrast. COMPARISON:  CT head and MRI Feb 23, 2021. FINDINGS: Brain: Increased edema associated with the right cerebellar and right dorsal midbrain infarct. Increased resulting mass effect on the fourth ventricle without hydrocephalus. Interval development of acute hemorrhage at the site of right cerebellar infarct. No evidence of new/interval acute large vascular territory infarct. Remote left cerebellar infarct. Basal cisterns are patent. No inferior cerebellar tonsillar herniation or midline shift. No mass lesion. Vascular: No hyperdense vessel identified. Skull: No acute fracture. Sinuses/Orbits: Mucosal thickening the left maxillary sinus, right posterior ethmoid air cell and left sphenoid sinus with frothy secretions in the sinuses. Unremarkable orbits. Other: No mastoid effusions. IMPRESSION: 1. Increased edema associated with the right cerebellar and right dorsal  midbrain infarcts with interval development of acute hemorrhage at the site of right cerebellar infarct. Increased resulting mass effect on the fourth ventricle without hydrocephalus. 2. Paranasal sinus mucosal thickening with frothy secretions, as detailed above. Electronically Signed   By: Feliberto HartsFrederick S Jones MD   On: 03/01/2021 14:39   MR BRAIN WO CONTRAST  Result Date: 02/23/2021 CLINICAL DATA:  Dizziness, nausea and vomiting. EXAM: MRI HEAD WITHOUT CONTRAST TECHNIQUE:  Multiplanar, multiecho pulse sequences of the brain and surrounding structures were obtained without intravenous contrast. COMPARISON:  CT studies same day FINDINGS: Brain: Diffusion imaging shows a 4-5 cm region of acute infarction within the superior cerebellum on the right. Acute infarction also within the dorsal right midbrain. Old infarction within the left superior cerebellum. Mild swelling but no acute hemorrhage in the right cerebellum. Old hemosiderin residual within the left cerebellar stroke. Cerebral hemispheres are normal. No mass, hydrocephalus or extra-axial collection. Vascular: No acute vascular finding by standard MRI. Skull and upper cervical spine: Negative Sinuses/Orbits: Mucosal inflammatory changes of the left maxillary and sphenoid sinuses. Orbits negative. Other: None IMPRESSION: 4-5 cm region of acute infarction within the superior cerebellum on the right. Punctate acute infarction in the right dorsal midbrain. Mild swelling. No evidence of acute hemorrhage. Old infarction in the left superior cerebellum with atrophy, encephalomalacia and gliosis and hemosiderin deposition. Electronically Signed   By: Paulina FusiMark  Shogry M.D.   On: 02/23/2021 08:42   CUP PACEART REMOTE DEVICE CHECK  Result Date: 02/09/2021 ILR summary report received. Battery status OK. Normal device function. No new symptom, tachy, brady, or pause episodes. No new AF episodes. Monthly summary reports and ROV/PRN.  RP  CT HEAD CODE STROKE WO CONTRAST  Result Date: 02/23/2021 CLINICAL DATA:  Code stroke. Initial evaluation for neuro deficit, stroke suspected. EXAM: CT HEAD WITHOUT CONTRAST TECHNIQUE: Contiguous axial images were obtained from the base of the skull through the vertex without intravenous contrast. COMPARISON:  Prior CT from 01/31/2020. FINDINGS: Brain: Streak/beam hardening artifact emanating from the left occipital calvarium noted, mildly limiting assessment. Cerebral volume within normal limits. Focal  encephalomalacia at the central and left superior cerebellum, consistent with a chronic left MCA distribution infarct. Subtle evolving hypodensity noted involving the superior right cerebellum and cerebellar vermis, concerning for an acute right SCA distribution infarct (series 3, image 12, 11). No associated hemorrhage or mass effect. No other evidence for acute large vessel territory infarct. No intracranial hemorrhage. No mass lesion, mass effect or midline shift. No hydrocephalus or extra-axial fluid collection. Vascular: No hyperdense vessel. Skull: Scalp soft tissues demonstrate no acute finding. Small focal exostosis noted at the right frontal calvarium. Calvarium intact. Sinuses/Orbits: Globes and orbital soft tissues within normal limits. Moderate left sphenoid and maxillary sinusitis. Additional scattered pneumatized secretions noted within the ethmoidal air cells. Subcentimeter osteoma noted within the right ethmoidal air cells as well. Mastoid air cells are clear. Other: None. ASPECTS [facility]([place] Stroke Program Early CT Score) - Ganglionic level infarction (caudate, lentiform nuclei, internal capsule, insula, M1-M3 cortex): 7 - Supraganglionic infarction (M4-M6 cortex): 3 Total score (0-10 with 10 being normal): 10 IMPRESSION: 1. Evolving hypodensity involving the superior right cerebellum/cerebellar vermis, concerning for an acute right SCA distribution infarct. No intracranial hemorrhage or mass effect. 2. ASPECTS is 10. 3. Underlying chronic left SCA distribution infarcts. Critical Value/emergent results were called by telephone at the time of interpretation on 02/23/2021 at 12:55 a.m. to provider Dr. Amada JupiterKirkpatrick, Who verbally acknowledged these results. Electronically Signed   By: Rise MuBenjamin  McClintock  M.D.   On: 02/23/2021 01:23   CT ANGIO HEAD NECK W WO CM (CODE STROKE)  Result Date: 02/23/2021 CLINICAL DATA:  Initial evaluation for neuro deficit, stroke suspected. EXAM: CT ANGIOGRAPHY HEAD AND NECK  TECHNIQUE: Multidetector CT imaging of the head and neck was performed using the standard protocol during bolus administration of intravenous contrast. Multiplanar CT image reconstructions and MIPs were obtained to evaluate the vascular anatomy. Carotid stenosis measurements (when applicable) are obtained utilizing NASCET criteria, using the distal internal carotid diameter as the denominator. CONTRAST:  2mL OMNIPAQUE IOHEXOL 350 MG/ML SOLN COMPARISON:  Prior head CT from earlier the same day. FINDINGS: CTA NECK FINDINGS Aortic arch: Visualized aortic arch normal in caliber with normal branch pattern. No stenosis about the origin of the great vessels. Right carotid system: Right common carotid artery patent from its origin to the bifurcation without stenosis. Minimal plaque about the right carotid bulb without stenosis. Right ICA patent distally without stenosis, dissection or occlusion. Left carotid system: Left CCA patent from its origin to the bifurcation without stenosis. Minimal plaque about the left carotid bulb without significant stenosis. Left ICA patent distally without stenosis, dissection or occlusion. Vertebral arteries: Left vertebral artery arises directly from the aortic arch. Right vertebral artery dominant. Vertebral arteries widely patent within the neck. Just prior to the skull base/dural reflection, there is distal right V3 segment (series 9, image 83), suspicious for a possible short-segment dissection. Small amount of probable intraluminal thrombus protrudes into the vascular lumen at this level. No frank raised dissection flap is seen. No more than mild stenosis at this level. The right vertebral artery is relatively normal just distally as it crosses into the cranial vault. Skeleton: No acute osseous finding. No discrete or worrisome osseous lesions. Mild cervical spondylosis noted at C5-6 without significant stenosis. Other neck: No other acute soft tissue abnormality within the neck. No  mass or adenopathy. Upper chest: Visualized upper chest demonstrates no acute finding. Review of the MIP images confirms the above findings CTA HEAD FINDINGS Anterior circulation: Petrous, cavernous, and supraclinoid segments patent without stenosis or other abnormality. A1 segments widely patent. Normal anterior communicating artery complex. Anterior cerebral arteries patent to their distal aspects without stenosis. No M1 stenosis or occlusion. Normal MCA bifurcations. Distal MCA branches well perfused and symmetric. Posterior circulation: Both V4 segments patent to the vertebrobasilar junction without stenosis. Both PICA origins patent and normal. Basilar somewhat diminutive but patent to its distal aspect without stenosis. Left superior cerebral artery patent. There is occlusion of the mid-distal right superior cerebellar artery, likely embolic (series 8, image 105). Left PCA supplied via the basilar as well as a prominent left posterior communicating artery. Predominant fetal type origin of the right PCA. Both PCAs remain widely patent to their distal aspects. Venous sinuses: Grossly patent allowing for timing the contrast bolus. Anatomic variants: Predominant fetal type origin of the right PCA. Review of the MIP images confirms the above findings IMPRESSION: 1. Focal intimal irregularity involving the distal right V3 segment, likely reflecting a short-segment dissection. No frank raised dissection flap or significant luminal narrowing. 2. 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 3. Mild for age atheromatous disease elsewhere about the major arterial vasculature of the head and neck. No other hemodynamically significant or correctable stenosis. These results were communicated to Dr. Amada Jupiter at 1:22 amon 5/10/2022by text page via the [facility] messaging system. Electronically Signed   By: Rise Mu M.D.   On: 02/23/2021 01:38  PHYSICAL EXAM  Temp:   [98.2 F (36.8 C)-98.7 F (37.1 C)] 98.7 F (37.1 C) (05/17 0500) Pulse Rate:  [76-94] 94 (05/17 0500) Resp:  [16-18] 16 (05/17 0500) BP: (129-152)/(74-87) 130/87 (05/17 0918) SpO2:  [98 %-100 %] 98 % (05/17 0500)  General - well nourished, well developed, in no apparent distress.    Ophthalmologic - fundi not visualized due to noncooperation.    Cardiovascular - regular rhythm and rate  Mental Status -  Level of arousal and orientation to time, place, and person were intact. Language including expression, naming, repetition, comprehension, reading, and writing was assessed and found intact Fund of Knowledge was assessed and was intact.  Cranial Nerves II - XII - II - Vision intact OU. III, IV, VI - Extraocular movements intact V - Facial sensation intact bilaterally. VII - Facial movement intact bilaterally. VIII - mild nystagmus on right and upward gaze with direction to the right X - Palate elevates symmetrically. XI - Chin turning & shoulder shrug intact bilaterally. XII - Tongue protrusion intact.  Motor Strength - The patient's strength was normal in all extremities and pronator drift was absent.   Motor Tone & Bulk - Muscle tone was assessed at the neck and appendages and was normal.  Bulk was normal and fasciculations were absent.   Reflexes - The patient's reflexes were normal in all extremities and she had no pathological reflexes.  Sensory - Light touch, temperature/pinprick were assessed and were normal.    Coordination - The patient had normal movements in the left hand and feet with no ataxia or dysmetria.  However right finger-to-nose dysmetria.  Tremor was absent.  Gait and Station - deferred   ASSESSMENT/PLAN Ms. Estie Sproule is a 60 y.o. female with history of recurrent stroke, hypertension admitted on 02/26/2021 for rehab due to right cerebellar infarct. She has been working with therapist pretty well resolved vertigo or N/V. however, developed  acute onset vertigo and N/V on 03/01/21. Stroke team was called back for evaluation.   Right cerebellar infarct with hemorrhagic conversion  CT 5/10 showed early ischemic changes at right cerebellum.    MRI confirmed right superior cerebellum moderate infarct and punctate dorsal midbrain infarct.    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    CT head 5/16 right cerebellar infarct with increased edema and hemorrhagic conversion  CT repeat in am  Loop recorder interrogation negative.    LDL 51  A1c 6.0.   echo pending.    Hypercoagulable work-up repeated again negative.    Was on aspirin 81 and the Brilinta 90 twice daily for 4 weeks and then Brilinta alone. Now given hemorrhagic conversion, brilinta discontinued, continue ASA 81 on discharge.  Ongoing aggressive stroke risk factor management  Therapy recommendations:  CIR  Disposition:  Pending   Hx of stroke  left cerebellar stroke in 01/2020 status post tPA admitted to [facility].  At that time, CTA head and neck no LVO or stenosis, MRI showed left superior cerebellar infarct.  EF normal range.  TCD bubble study negative.  LDL 138, A1c 6.0.  Hypercoagulable work-up negative.  Bilateral lower extremity DVT negative.  Discharged with DAPT for 3 weeks and then aspirin alone, as well as Lipitor 40.  After discharge she follow-up with Dr. [name] at [facility], had loop recorder placed in 02/2020.  Hypertension  Stable  Resumed amlodipine 5mg   BP goal < 160 given hemorrhagic conversion  Long-term  BP goal normotensive  Hyperlipidemia  Home meds:  Lipitor 40mg   LDL 51, at goal < 70  Continue lipitor 40  Continue statin at discharge  Other Stroke Risk Factors    Other Active Problems    Hospital day # 4   , MD PhD Stroke Neurology 03/02/2021 1:48 PM    To contact Stroke Continuity provider, please refer to 03/04/2021. After hours, contact  General Neurology

## 2021-03-02 NOTE — Progress Notes (Signed)
Occupational Therapy Session Note  Patient Details  Name: Judy Gutierrez MRN: [id number] Date of Birth: 1961-07-10  Today's Date: 03/02/2021 OT Individual Time: 1430-1500 and 1430-1500 OT Individual Time Calculation (min): 30 min and 30 min   Short Term Goals: Week 1:  OT Short Term Goal 1 (Week 1): Pt will complete toileting tasks at CGA level OT Short Term Goal 2 (Week 1): Pt will don pants with CGA OT Short Term Goal 3 (Week 1): Pt will utilize appropriate visual compensation strategies during ADL routine with min cueing OT Short Term Goal 4 (Week 1): Pt will transfer to walk in shower with min A  Skilled Therapeutic Interventions/Progress Updates:    Visit 1: Pain: no c/o Pain  Visual training for L eye CGA stand pivot to wc from bed and to mat  Focused on trunk and RUE motor control: -wt shifting with trunk elongation to each side with cues for increased hip hike on R with wt shift to L -B ball hold with large physioball reaching forward and back for 10 longs stretches followed by 10 sec isometric hold for 3 sets with 30 sec rest breaks in between - 3 sets of 10 reps with B ball overhead reaches with cues for symmetrical trunk alignment as she tended to shift to L - hula hoop holds with rotating hoop and reaching overhead with focus on holding trunk in midline - holding hoop around waist and twisting hoop side to side and rotating trunk with head forward.  -pilates arm circles for R sh control holding 3# hand weights Pt tolerated exercises well but did fatigue easily.  Transferred back to chair and then back to bed -  CGA.  Resting in bed with all needs met, bed alarm set.    Visit 2:  Pain: no c/o pain Pt received in bed with spouse present for first part of session.  Pt sat to EOB with S but then was leaning side to side and said she was really tired.  She drank some water and felt better.  She did want to shower.  With RW ambulated to toilet then shower with CGA.  Overall  completed toileting, shower and dressing all with supervision.  Pt was quite tired at end of session, layed back in bed.  Bed alarm set and all needs met.   Therapy Documentation Precautions:  Precautions Precautions: Fall Precaution Comments: Diplopia, trunk/RUE/LE ataxic Restrictions Weight Bearing Restrictions: No    Vital Signs: Therapy Vitals Temp: (!) 97.5 F (36.4 C) Pulse Rate: 82 Resp: 18 BP: 127/72 Patient Position (if appropriate): Lying Oxygen Therapy SpO2: 100 % O2 Device: Room Air  ADL: ADL Eating: Supervision/safety Where Assessed-Eating: Edge of bed Grooming: Setup Where Assessed-Grooming: Standing at sink Upper Body Bathing: Supervision/safety Where Assessed-Upper Body Bathing: Shower Lower Body Bathing: Supervision/safety Where Assessed-Lower Body Bathing: Shower Upper Body Dressing: Setup Where Assessed-Upper Body Dressing: Edge of bed Lower Body Dressing: Supervision/safety Where Assessed-Lower Body Dressing: Edge of bed Toileting: Supervision/safety Where Assessed-Toileting: Glass blower/designer: Therapist, music Method: Counselling psychologist: Energy manager: Curator Method: Heritage manager: Radio broadcast assistant   Therapy/Group: Individual Therapy  [place] 03/02/2021, 4:27 PM

## 2021-03-02 NOTE — Progress Notes (Signed)
Physical Therapy Session Note  Patient Details  Name: Judy Gutierrez MRN: [id number] Date of Birth: 1961-06-29  Today's Date: 03/02/2021 PT Individual Time: 0800-0900 + 1300-1330 PT Individual Time Calculation (min): 60 min  + 30 min  Short Term Goals: Week 1:  PT Short Term Goal 1 (Week 1): Patient will complete bed<> wc transfers with LRAD and CGA consistently PT Short Term Goal 2 (Week 1): Patient will ambulate at least 163ft with LRAD and CGA consistently PT Short Term Goal 3 (Week 1): Patient will improve BBS >5 pts  Skilled Therapeutic Interventions/Progress Updates:     1st session: Pt received supine in bed, agreeable to PT tx. No reports of pain and no reports of dizziness. She reports significant improvement of symptoms since yesterday. BP assessed in supine as BP goals now < 160 per neuro stroke service. BP reading 128/84. Supine<>sit with supervision with HOB flat. Sit's unsupported at EOB with supervision as well. Donned hospital socks with setupA via figure-4 technique - dysmetria and dyscoordination noted with RUE. Pt requested to use bathroom before heading to therapy gym. Sit<>stand with CGA to RW and ambulated short distance in room with minA and RW to regular height toilet - able to manage lower body dressing in standing with minA for balance. Pt continent of bladder and ambulated sinkside with minA and RW where she required minA for standing balance while washing hands. W/c transport for time management to main rehab gym. Stand<>pivot transfer with CGA and no AD from w/c to mat table. Gait training ~170ft with minA and RW - truncal ataxia, BLE ataxia with mild scissoring. Cues for corrections and safety awareness to reduce deficits and improve forward gaze. Focused remainder of session on NMR for standing balance.  -Completed cone stacking with RUE on level ground with minA for balance -Completed cone stacking with RUE on blue air-ex foam with modA for balance -Completed card  matching with RUE on blue air-ex foam with modA for balance *over/undershooting with RUE with dysmetria. Poor FMC with RUE for picking up playing cards for matching although 100% accurate. Rest breaks provided as needed for recovery b/w activities. She denied any dizziness or ligtheadedness but does report fatigue.   W/c transport back to her room where pt requested to lay down in bed. Stand<>pivot transfer with minA and no AD to EOB and completed bed mobility with supervision without bed features. Remained supine in bed with bed alarm on and needs in reach. Pt informed of upcoming therapy schedule. Reached out to SLP for consult due to dysarthria.  2nd session: Pt greeted seated in recliner, husband at bedside. Pt agreeable to PT tx. No reports of pain or dizziness. Stand<>pivot transfer with minA and no AD from recliner to w/c - cues needed for safety awareness and taking her time as she tends to rush things. W/c transport for time management to ortho gym. Stand<>pivot with CGA to Nustep and completed 5 minutes at workload 6, using both BUE/BLE, focusing on coordination with alternating reciprocal movement patterns as well as endurance training. Pt needing x2 brief rest breaks and maintains a cadence of ~35-40 steps/minute. Completed standing activity with BITS with visual pursuit with RUE and large targets, requires minA for standing and demonstrated 47% accuracy - under/overshooting with RUE and difficulty isolating index finger to point to target. She then completed alternating and unilateral stepping with CGA and RW to 3inch platform, again working on coordination and reciprocal movements. W/c transport back to her room for time management and  completed stand<>pivot transfer with minA to bed. Bed mobility completed supervision and she remained supine in bed with needs in reach and bed alarm on.   Therapy Documentation Precautions:  Precautions Precautions: Fall Precaution Comments: Diplopia,  trunk/RUE/LE ataxic Restrictions Weight Bearing Restrictions: No General:    Therapy/Group: Individual Therapy  Orrin Brigham 03/02/2021, 7:33 AM

## 2021-03-03 ENCOUNTER — Inpatient Hospital Stay (HOSPITAL_COMMUNITY): Payer: No Typology Code available for payment source

## 2021-03-03 ENCOUNTER — Encounter (HOSPITAL_COMMUNITY): Payer: Self-pay | Admitting: Physical Medicine & Rehabilitation

## 2021-03-03 DIAGNOSIS — Z8673 Personal history of transient ischemic attack (TIA), and cerebral infarction without residual deficits: Secondary | ICD-10-CM | POA: Diagnosis not present

## 2021-03-03 DIAGNOSIS — I639 Cerebral infarction, unspecified: Secondary | ICD-10-CM

## 2021-03-03 DIAGNOSIS — I63441 Cerebral infarction due to embolism of right cerebellar artery: Secondary | ICD-10-CM | POA: Diagnosis not present

## 2021-03-03 LAB — PROTHROMBIN GENE MUTATION

## 2021-03-03 MED ORDER — IOHEXOL 350 MG/ML SOLN
50.0000 mL | Freq: Once | INTRAVENOUS | Status: AC | PRN
  Administered 2021-03-03: 50 mL via INTRAVENOUS

## 2021-03-03 MED ORDER — CLOPIDOGREL BISULFATE 75 MG PO TABS
75.0000 mg | ORAL_TABLET | Freq: Every day | ORAL | Status: DC
  Administered 2021-03-04 – 2021-03-05 (×2): 75 mg via ORAL
  Filled 2021-03-03 (×2): qty 1

## 2021-03-03 NOTE — Progress Notes (Signed)
Occupational Therapy Note  Patient Details  Name: Judy Gutierrez MRN: [id number] Date of Birth: 1961/06/26  Today's Date: 03/03/2021 OT Missed Time: 30 Minutes Missed Time Reason: CT/MRI  Pt missed her 30 min OT session as she was out for CT scan and had not returned for the entire session.   Shylynn Bruning 03/03/2021, 12:09 PM

## 2021-03-03 NOTE — Progress Notes (Addendum)
PROGRESS NOTE   Subjective/Complaints: Patient seen sitting up, working with therapies this AM.  She states she slept well overnight.  She has questions regarding CT scan. She was seen by Neurology yesterday, notes reviewed - CT ordered.   ROS: Denies CP, SOB, N/V/D  Objective:   CT HEAD WO CONTRAST  Result Date: 03/03/2021 CLINICAL DATA:  60 year old female with right SCA cerebellar infarct on MRI 02/23/2021. Subsequent encounter. EXAM: CT HEAD WITHOUT CONTRAST TECHNIQUE: Contiguous axial images were obtained from the base of the skull through the vertex without intravenous contrast. COMPARISON:  Head CT 03/01/2021 and earlier. FINDINGS: Brain: Right superior cerebellar artery territory and cerebellar vermis cytotoxic edema with petechial hemorrhage. But new confluent cytotoxic edema now in the right PICA territory (series 3, image 5). Subsequent increased posterior fossa mass effect, with decreased prepontine and pre medullary cisterns. No tonsillar herniation. Increased effacement of the 4th ventricle, but lateral and 3rd ventricle size not significantly changed at this time. Supratentorial gray and white matter signal is stable. No new intracranial hemorrhage. Vascular: Mild Calcified atherosclerosis at the skull base. No suspicious intracranial vascular hyperdensity. Skull: No acute osseous abnormality identified. Small benign right frontal bone exostosis on series 4, image 59. Sinuses/Orbits: Stable mucosal thickening and bubbly opacity in the left sphenoid, left maxillary, and posterior right ethmoid sinuses. Tympanic cavities and mastoids remain clear. Other: Leftward gaze deviation. Visualized scalp soft tissues are within normal limits. IMPRESSION: 1. Confluent acute right PICA infarct is new since 03/01/2021, superimposed on the recent right SCA infarct. Subsequent increased cytotoxic edema in the right cerebellum with increased  posterior fossa mass effect including effaced 4th ventricle. But no malignant hemorrhagic transformation or ventriculomegaly at this time. 2. Supratentorial brain appears stable, negative. Electronically Signed   By: Odessa Fleming M.D.   On: 03/03/2021 08:42   CT HEAD WO CONTRAST  Result Date: 03/01/2021 CLINICAL DATA:  Stroke follow-up. EXAM: CT HEAD WITHOUT CONTRAST TECHNIQUE: Contiguous axial images were obtained from the base of the skull through the vertex without intravenous contrast. COMPARISON:  CT head and MRI Feb 23, 2021. FINDINGS: Brain: Increased edema associated with the right cerebellar and right dorsal midbrain infarct. Increased resulting mass effect on the fourth ventricle without hydrocephalus. Interval development of acute hemorrhage at the site of right cerebellar infarct. No evidence of new/interval acute large vascular territory infarct. Remote left cerebellar infarct. Basal cisterns are patent. No inferior cerebellar tonsillar herniation or midline shift. No mass lesion. Vascular: No hyperdense vessel identified. Skull: No acute fracture. Sinuses/Orbits: Mucosal thickening the left maxillary sinus, right posterior ethmoid air cell and left sphenoid sinus with frothy secretions in the sinuses. Unremarkable orbits. Other: No mastoid effusions. IMPRESSION: 1. Increased edema associated with the right cerebellar and right dorsal midbrain infarcts with interval development of acute hemorrhage at the site of right cerebellar infarct. Increased resulting mass effect on the fourth ventricle without hydrocephalus. 2. Paranasal sinus mucosal thickening with frothy secretions, as detailed above. Electronically Signed   By: Feliberto Harts MD   On: 03/01/2021 14:39   Recent Labs    03/01/21 0500  WBC 5.1  HGB 11.1*  HCT 33.9*  PLT 214  Recent Labs    03/01/21 0500  NA 136  K 4.0  CL 105  CO2 26  GLUCOSE 106*  BUN 16  CREATININE 0.62  CALCIUM 8.7*    Intake/Output Summary (Last 24  hours) at 03/03/2021 0850 Last data filed at 03/03/2021 0710 Gross per 24 hour  Intake 480 ml  Output --  Net 480 ml        Physical Exam: Vital Signs Blood pressure 125/87, pulse (!) 103, temperature 99.2 F (37.3 C), temperature source Oral, resp. rate 16, height 5\' 2"  (1.575 m), weight 60.2 kg, last menstrual period 12/08/2010, SpO2 99 %.  Constitutional: No distress . Vital signs reviewed. HENT: Normocephalic.  Atraumatic. Eyes: EOMI. No discharge. Cardiovascular: No JVD.  RRR. Respiratory: Normal effort.  No stridor.  Bilateral clear to auscultation. GI: Non-distended.  BS +. Skin: Warm and dry.  Intact. Psych: Normal mood.  Normal behavior. Musc: No edema in extremities.  No tenderness in extremities. Neuro: Alert Motor: 5/5 throughout RUE dysmetria, stable Dysarthria  Assessment/Plan: 1. Functional deficits which require 3+ hours per day of interdisciplinary therapy in a comprehensive inpatient rehab setting.  Physiatrist is providing close team supervision and 24 hour management of active medical problems listed below.  Physiatrist and rehab team continue to assess barriers to discharge/monitor patient progress toward functional and medical goals  Care Tool:  Bathing    Body parts bathed by patient: Right arm,Right lower leg,Left arm,Chest,Abdomen,Front perineal area,Buttocks,Left upper leg,Left lower leg,Face,Right upper leg         Bathing assist Assist Level: Supervision/Verbal cueing     Upper Body Dressing/Undressing Upper body dressing   What is the patient wearing?: Pull over shirt,Bra    Upper body assist Assist Level: Set up assist    Lower Body Dressing/Undressing Lower body dressing      What is the patient wearing?: Underwear/pull up,Pants     Lower body assist Assist for lower body dressing: Supervision/Verbal cueing     Toileting Toileting    Toileting assist Assist for toileting: Supervision/Verbal cueing      Transfers Chair/bed transfer  Transfers assist     Chair/bed transfer assist level: Contact Guard/Touching assist     Locomotion Ambulation   Ambulation assist      Assist level: Minimal Assistance - Patient > 75% Assistive device: Walker-rolling Max distance: 160   Walk 10 feet activity   Assist     Assist level: Minimal Assistance - Patient > 75% Assistive device: Walker-rolling   Walk 50 feet activity   Assist    Assist level: Minimal Assistance - Patient > 75% Assistive device: Walker-rolling    Walk 150 feet activity   Assist    Assist level: Minimal Assistance - Patient > 75% Assistive device: Walker-rolling    Walk 10 feet on uneven surface  activity   Assist Walk 10 feet on uneven surfaces activity did not occur: Safety/medical concerns         Wheelchair     Assist Will patient use wheelchair at discharge?: No             Wheelchair 50 feet with 2 turns activity    Assist            Wheelchair 150 feet activity     Assist           Medical Problem List and Plan: 1.  Vertigo nausea vomiting secondary to RIght  superior cerebellum infarct with punctate acute infarct in the right dorsal midbrain  with hemorrhagic conversion, and now right PICA infarct as well as history of CVA 2019/loop recorder  Repeat CT  On 5/16 showing -       1. Increased edema associated with the right cerebellar and right dorsal midbrain infarcts with interval development of acute hemorrhage at the site of right cerebellar infarct. Increased resulting mass effect on the fourth ventricle without hydrocephalus.      2. Paranasal sinus mucosal thickening with frothy secretions.  Repeat CT on 5/17 showing new right PICA infarct  Appreciate Neuro recs, discussed with Neuro, plan for repeat CTA/MRI.  Continue CIR  Team conference today to discuss current and goals and coordination of care, home and environmental barriers, and discharge  planning with nursing, case manager, and therapies. Please see conference note from today as well.  2.  Antithrombotics: -DVT/anticoagulation: Lovenox             -antiplatelet therapy: Aspirin 81 mg daily, Brilinta d/ced 3. Pain Management: Tylenol as needed             Monitor, particularly for headaches with increased exertion  Controlled on 5/18 4. Mood: Provide emotional support             -antipsychotic agents: N/A 5. Neuropsych: This patient is capable of making decisions on her own behalf. 6. Skin/Wound Care: Routine skin checks 7. Fluids/Electrolytes/Nutrition: Routine in and outs 8.  Hypertension.  Patient on Cozaar50 mg daily, Norvasc 5 mg daily prior to admission.    Norvasc 5 started on 5/16             Vitals with BMI 03/03/2021 03/03/2021 03/02/2021  Height - - -  Weight - - -  BMI - - -  Systolic 125 136 585  Diastolic 87 86 75  Pulse - 103 74   Relatively controlled on 5/18 9. Hyperlipidemia: Lipitor 10. ABLA             Hb 11.1 on 5/16  Cont to monitor 12. Transaminitis  LFTs elevated on 5/16, plan to repeat labs later this week  Cont to monitor 13. Dysarthria  SLP ordered  LOS: 5 days A FACE TO FACE EVALUATION WAS PERFORMED  Judy Gutierrez 03/03/2021, 8:50 AM

## 2021-03-03 NOTE — Evaluation (Signed)
Speech Language Pathology Assessment and Plan  Patient Details  Name: Judy Gutierrez MRN: [id number] Date of Birth: [birth year]  SLP Diagnosis: Dysarthria  Rehab Potential: Excellent ELOS: 7-10 days   Today's Date: 03/03/2021 SLP Individual Time: 4034-7425 SLP Individual Time Calculation (min): 45 min  Hospital Problem: Principal Problem:   Embolic stroke involving right cerebellar artery ([place]) Active Problems:   Transaminitis   Visual disturbance as complication of stroke   Acute cerebral infarction [facility])  Past Medical History:  Past Medical History:  Diagnosis Date  . Disturbance of skin sensation   . Myalgia and myositis, unspecified   . Other B-complex deficiencies   . 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 Stroke ([place])   . Unspecified essential hypertension    Past Surgical History:  Past Surgical History:  Procedure Laterality Date  . BUBBLE STUDY  03/05/2020   Procedure: BUBBLE STUDY;  Surgeon: Donato Heinz, MD;  Location: [place];  Service: Cardiovascular;;  . [place]  . LOOP RECORDER INSERTION N/A 03/05/2020   Procedure: LOOP RECORDER INSERTION;  Surgeon: Deboraha Sprang, MD;  Location: [facility];  Service: Cardiovascular;  Laterality: N/A;  . TEE WITHOUT CARDIOVERSION N/A 03/05/2020   Procedure: TRANSESOPHAGEAL ECHOCARDIOGRAM (TEE);  Surgeon: Donato Heinz, MD;  Location: [facility] ENDOSCOPY;  Service: Cardiovascular;  Laterality: N/A;    Assessment / Plan / Recommendation Clinical Impression Judy Gutierrez is a 60 year old right-handed female with history of prior CVA 01/2018 maintained on aspirin status post loop recorder, hypertension, hyperlipidemia. History taken from chart review and patient. Patient lives with spouse independent prior to admission. Two-level home bed and bath upstairs. She has a sister with good support. She presented on 02/22/2021 with vertigo, acute onset as well as nausea and vomiting. CT of  the head showed evolving hypodensity involving the superior right cerebellum/cerebellar vermis, concerning for acute right SCA distribution infarction. No intracranial hemorrhage or mass-effect. CT angiogram of the head showed focal intimal irregularity involving the distal right V3 segment likely reflecting a short segment dissection. Downstream occlusion of the mid distal right superior cerebellar artery likely embolic. MRI of the brain showed a 4-5 cm region of acute infarction within the superior cerebellum on the right. Punctate acute infarct in the right dorsal midbrain with mild swelling. Old infarction in the left superior cerebellum with atrophy/encephalomalacia. Patient did not receive tPA. Echocardiogram with EF of 95-63%, grade 1 diastolic dysfunction. Admission chemistries unremarkable except potassium 2.8, glucose 120, urine drug screen negative alcohol negative patient currently maintained on low-dose aspirin as well as Brilinta for CVA prophylaxis x 4 weeks then Brilinta alone. Subcutaneous Lovenox for DVT prophylaxis. Tolerating a regular consistency diet. Therapy evaluations completed due to patient's vertigo and decreased functional mobility was admitted for a comprehensive rehab program. Please see preadmission assessment from earlier today. .  Patient transferred to CIR on 02/26/2021. MD initially stated no ST needs at admission, PT referred pt to SLP for evaluation of Dysarthria.  Pt presents with mild dysarthria, characterized by imprecise articulation at sentence level speech. Pt demonstrates some inconsistent awareness of dysarthric speech, however able to self-report compensatory strategies she utilizes at times. SLP providing visual aid in room to increase understanding, carryover and utilization of compensatory strategies (overarticulate, slow down, loud and pacing). Pt reports no change in cognition, staff agrees no interfering cognitive deficits noted.  Discussed with  patient plan for targeting high level problem solving for home environment and medication management tasks to feel comfortable  with new medication regime before d/c. Pt verbalized agreement with plan. Pt will benefit from skilled ST to promote safety and independence with communication and daily routine for home environment.    Skilled Therapeutic Interventions          Pt participating in assessment of Dysarthria and non-standardized assessment of cognitive communication skills. Please see above.    SLP Assessment  Patient will need skilled [facility] Pathology Services during CIR admission    Recommendations  Oral Care Recommendations: Oral care BID Recommendations for Other Services: Neuropsych consult Patient destination: Home Follow up Recommendations: None Equipment Recommended: None recommended by SLP    SLP Frequency 1 to 3 out of 7 days   SLP Duration  SLP Intensity  SLP Treatment/Interventions 7-10 days  Minumum of 1-2 x/day, 30 to 90 minutes  Therapeutic Exercise;Therapeutic Activities;Medication managment;Patient/family education;Speech/Language facilitation;Cueing hierarchy    Pain Pain Assessment Pain Scale: 0-10 Pain Score: 0-No pain  Prior Functioning Cognitive/Linguistic Baseline: Within functional limits Type of Home: House  Lives With: Spouse Available Help at Discharge: Family Vocation: Retired  SLP Evaluation Cognition Overall Cognitive Status: Within Functional Limits for tasks assessed Arousal/Alertness: Awake/alert Orientation Level: Oriented X4 Attention: Selective Selective Attention: Appears intact Memory: Appears intact Awareness: Appears intact Problem Solving: Appears intact Safety/Judgment: Appears intact  Comprehension Auditory Comprehension Overall Auditory Comprehension: Appears within functional limits for tasks assessed Yes/No Questions: Within Functional Limits Commands: Within Functional  Limits Expression Expression Primary Mode of Expression: Verbal Verbal Expression Overall Verbal Expression: Appears within functional limits for tasks assessed Initiation: No impairment Level of Generative/Spontaneous Verbalization: Conversation Repetition: No impairment Written Expression Dominant Hand: Right Oral Motor Oral Motor/Sensory Function Overall Oral Motor/Sensory Function: Mild impairment Facial ROM: Within Functional Limits Facial Symmetry: Within Functional Limits Facial Strength: Within Functional Limits Facial Sensation: Within Functional Limits Lingual ROM: Within Functional Limits Lingual Symmetry: Within Functional Limits Lingual Strength: Within Functional Limits Lingual Sensation: Reduced Velum: Within Functional Limits Mandible: Within Functional Limits Motor Speech Overall Motor Speech: Impaired Phonation: Normal Resonance: Within functional limits Articulation: Impaired Level of Impairment: Phrase Intelligibility: Intelligible Motor Speech Errors: Aware;Inconsistent Effective Techniques: Slow rate;Over-articulate;Pacing  Care Tool Care Tool Cognition Expression of Ideas and Wants Expression of Ideas and Wants: Without difficulty (complex and basic) - expresses complex messages without difficulty and with speech that is clear and easy to understand   Understanding Verbal and Non-Verbal Content Understanding Verbal and Non-Verbal Content: Understands (complex and basic) - clear comprehension without cues or repetitions   Memory/Recall Ability *first 3 days only Memory/Recall Ability *first 3 days only: Current season;Location of own room;Staff names and faces;That he or she is in a hospital/hospital unit    Short Term Goals: Week 1: SLP Short Term Goal 1 (Week 1): Pt will understand, recall and utilize compensatory strategies for dysarthria with 90% accuracy Supervision A SLP Short Term Goal 2 (Week 1): Pt will complete medication management task  with 100% accuracy Supervision A SLP Short Term Goal 3 (Week 1): Pt will complete complex problem solving task for home environment with 90% accuracy Supervision A  Refer to Care Plan for Long Term Goals  Recommendations for other services: Neuropsych  Discharge Criteria: Patient will be discharged from SLP if patient refuses treatment 3 consecutive times without medical reason, if treatment goals not met, if there is a change in medical status, if patient makes no progress towards goals or if patient is discharged from hospital.  The above assessment, treatment plan, treatment alternatives and goals were  discussed and mutually agreed upon: by patient  Dewaine Conger 03/03/2021, 10:08 AM

## 2021-03-03 NOTE — Progress Notes (Signed)
Occupational Therapy Session Note  Patient Details  Name: Judy Gutierrez MRN: [id number] Date of Birth: 1960/10/18  Today's Date: 03/03/2021 OT Individual Time: 1315-1400 OT Individual Time Calculation (min): 45 min  and Today's Date: 03/03/2021 OT Missed Time: 15 Minutes Missed Time Reason: Other (comment) (pt still eating lunch, got back late from CT scan)   Short Term Goals: Week 1:  OT Short Term Goal 1 (Week 1): Pt will complete toileting tasks at CGA level OT Short Term Goal 2 (Week 1): Pt will don pants with CGA OT Short Term Goal 3 (Week 1): Pt will utilize appropriate visual compensation strategies during ADL routine with min cueing OT Short Term Goal 4 (Week 1): Pt will transfer to walk in shower with min A  Skilled Therapeutic Interventions/Progress Updates:    Pt received in room with spouse present. Pt requested 15 more minutes to finish eating lunch ad she had just gotten back from CT scan. Missing 15 minutes of OT tx due to eating lunch, unable to move around schedule. Pt taken down to gym, instructed in BITs board activities while standing with RW for functional reach and dynamic standing balance. Pt demo'd no LOB during activity and good visual scanning. Pt then instructed in 9 hole peg test while seated, scored 32 sec on L hand, 1:48 on R hand. Instructed in large peg board activity with matching task while standing to increase tolerance to upright position. Pt req min cuing for proper peg placement to match card, min cuing to use R hand as pt automatically initiates with LUE. Pt requires increased time for task due to ataxic movements of RUE, but was able to complete with time. After tx, pt taken back to room, helped to bed and left with all needs met and bed alarm on.  Therapy Documentation Precautions:  Precautions Precautions: Fall Precaution Comments: Diplopia, trunk/RUE/LE ataxic Restrictions Weight Bearing Restrictions: No Pain: Pain Assessment Pain Scale:  0-10 Pain Score: 0-No pain   Therapy/Group: Individual Therapy  Fredrick Dray 03/03/2021, 1:08 PM

## 2021-03-03 NOTE — Progress Notes (Signed)
Physical Therapy Session Note  Patient Details  Name: Judy Gutierrez MRN: [id number] Date of Birth: 1961-02-14  Today's Date: 03/03/2021 PT Individual Time: 1422-1500 PT Individual Time Calculation (min): 38 min   Short Term Goals: Week 1:  PT Short Term Goal 1 (Week 1): Patient will complete bed<> wc transfers with LRAD and CGA consistently PT Short Term Goal 2 (Week 1): Patient will ambulate at least 171ft with LRAD and CGA consistently PT Short Term Goal 3 (Week 1): Patient will improve BBS >5 pts  Skilled Therapeutic Interventions/Progress Updates:    Pt received supine in bed with Neurology MD present following up from imaging. MD cleared pt to continue participating in therapy and to monitor SBP to ensure <160. Pt agreeable to therapy session though reporting fatigue. Supine>sitting R EOB, HOB slightly elevated but not using bedrails, with supervision. Pt reports her husband forgot to bring her tennis shoes today. L stand pivot EOB>w/c, no AD, with min assist for balance due to increased postural sway. Pt wearing glasses with L lens covered in tape to help with double vision throughout session. Transported to/from gym in w/c for time management and energy conservation. Assessed sitting vitals: BP 112/74 (MAP 86), HR 97bpm  Gait training ~23ft using RW with light min assist for balance and pt demoing R LE ataxia with mild scissoring causing R ankle to move towards inversion intermittently but did not roll it during session - in addition has increased R/L postural sway with pt compensating for balance with BUE support on RW but would intermittently push on RW for balance so much that it would cause it to tip over slightly. Reassessed vitals: BP 119/74 (MAP 89), HR 101bpm Gait training ~56ft, no AD, with heavier min assist for balance as pt continues to demo R/L and anterior biased postural sway requiring assist to maintain upright and prevent LOB.   Performed the following standing balance tasks:   - static standing with normal BOS 30seconds with pt requiring min assist progressed to CGA as she demos predominantly posterior LOB  - static standing with horizontal and vertical head turns with min assist/CGA for balance and pt demoing more postural sway with horizontal turns  - static standing with reciprocal arm movements to elicit anticipatory postural adjustments with CGA/min assist for balance - static standing balance in semi-tandem with wider base x30 seconds each and pt demos diagonal postural sway in opposite direction of where her feet are positioned requiring min/mod assist to maintain upright - had constant diagonal postural sway with L LE forward with difficulty maintaining static position - dynamic standing balance with reciprocal forward foot taps to external target focusing on pausing in each position and regaining balance and stopping postural sway with min/mod assist Reassessed vitals: BP 115/87 (MAP 96), HR 100bpm   Gait training ~44ft, no AD, with min assist for balance due to increased R/L and anterior postural sway with pt demoing slightly decreased R LE scissoring and more fluid/reciprocal stepping though continues to need improvement. Transported back to room in w/c. R stand pivot w/c>EOB using UE support on bedrail with CGA. Sit>supine supervision with pt demoing some decreased safety awareness trying to bring feet up onto bed prior to therapist moving wheelchair to allow the space. Pt left supine in bed with needs in reach and bed alarm on.   Therapy Documentation Precautions:  Precautions Precautions: Fall Precaution Comments: Diplopia, trunk/RUE/LE ataxic Restrictions Weight Bearing Restrictions: No  Pain: No reports of pain throughout session.   Therapy/Group: Individual Therapy  Ginny Forth , PT, DPT, CSRS  03/03/2021, 12:24 PM

## 2021-03-03 NOTE — Patient Care Conference (Signed)
Inpatient RehabilitationTeam Conference and Plan of Care Update Date: 03/03/2021   Time: 11:20 AM    Patient Name: Judy Gutierrez      Medical Record Number: [id number]  Date of Birth: November 07, 1960 Sex: Female         Room/Bed: 4M06C/4M06C-01 Payor Info: Payor: Advertising copywriter / Plan: [employer] / Product Type: *No Product type* /    Admit Date/Time:  02/26/2021  2:36 PM  Primary Diagnosis:  Embolic stroke involving right cerebellar artery [facility])  Hospital Problems: Principal Problem:   Embolic stroke involving right cerebellar artery (HCC) Active Problems:   Transaminitis   Visual disturbance as complication of stroke   Acute cerebral infarction [facility])    Expected Discharge Date: Expected Discharge Date: 03/11/21  Team Members Present: Physician leading conference: Dr. Maryla Morrow Care Coodinator Present: Chana Bode, RN, BSN, CRRN;Becky Dupree, LCSW Nurse Present: Chana Bode, RN PT Present: Wynelle Link, PT OT Present: Primitivo Gauze, OT SLP Present: Other (comment) Chinita Greenland, SLP) PPS Coordinator present : Fae Pippin, SLP     Current Status/Progress Goal Weekly Team Focus  Bowel/Bladder   Continent of bowel/bladder. LBM 03/02/2021  Remain continent  Assess Qshift and PRN   Swallow/Nutrition/ Hydration             ADL's   CGA with ADL transfers, close S with self care  Supervision transfers, LB dressing, toileting,  ADL training, mobility, balance, pt education   Mobility   supervision bed mobility, minA transfers, 176ft minA with RW, modA with no AD, 12 stairs minA  supervision  Activity tolerance, safety awareness, functional gait and transfers, dizziness/vestibular   Communication   Eval pending         Safety/Cognition/ Behavioral Observations            Pain   no c/o pain  pain <3/10  Assess Qshift and PRN   Skin   Skin intact.  Remain intact.  Asssess Qshift and PRN     Discharge Planning:  Home with husband who works during the day and  sister to stay for short time, but then will be alone.   Team Discussion: Increased cerebral edema, neurology discontinued Brilinta. Repeat CT noted new CVA in PICA per MD. Progress limited by new stroke, poor balnace, poor safety awareness, poor insight into deficits, incoordination of right side and baseline cognitive status.  Patient on target to meet rehab goals: Currently requires min assist for transfers, able to ambulate 100' and maneuver up and down 12 steps with min assist. Goals set for supervision - CGA however given new stroke; anticipate change to min assist. SLP goals set for mod I and working on complex cognitive issues and dysarthria.  *See Care Plan and progress notes for long and short-term goals.   Revisions to Treatment Plan:   Teaching Needs: Safety, cognitive assistance, medication management, secondary stroke risk management, toileting, transfers, etc.   Current Barriers to Discharge: Decreased caregiver support and Home enviroment access/layout  Possible Resolutions to Barriers: Family education with spouse and sister Hired assistance to help sister when spouse is at work    Location manager Current Status: Vertigo nausea vomiting secondary to RIght  superior cerebellum infarct with punctate acute infarct in the right dorsal midbrain with hemorrhagic conversion, and now right PICA infarct as well as history of CVA 2019/loop recorder  Barriers to Discharge: Decreased family/caregiver support;Medical stability;Other (comments)  Barriers to Discharge Comments: Hemorrhagic conversion, new stroke Possible Resolutions to Barriers/Weekly Focus: Therapies, optimize BP meds, follow  labs - LFTs, Neuro recs   Continued Need for Acute Rehabilitation Level of Care: The patient requires daily medical management by a physician with specialized training in physical medicine and rehabilitation for the following reasons: Direction of a multidisciplinary physical rehabilitation  program to maximize functional independence : Yes Medical management of patient stability for increased activity during participation in an intensive rehabilitation regime.: Yes Analysis of laboratory values and/or radiology reports with any subsequent need for medication adjustment and/or medical intervention. : Yes   I attest that I was present, lead the team conference, and concur with the assessment and plan of the team.   Chana Bode B 03/03/2021, 1:46 PM

## 2021-03-03 NOTE — Progress Notes (Signed)
Physical Therapy Session Note  Patient Details  Name: Judy Gutierrez MRN: [id number] Date of Birth: Aug 27, 1961  Today's Date: 03/03/2021 PT Individual Time: 0800-0856 PT Individual Time Calculation (min): 56 min   Short Term Goals: Week 1:  PT Short Term Goal 1 (Week 1): Patient will complete bed<> wc transfers with LRAD and CGA consistently PT Short Term Goal 2 (Week 1): Patient will ambulate at least 155ft with LRAD and CGA consistently PT Short Term Goal 3 (Week 1): Patient will improve BBS >5 pts  Skilled Therapeutic Interventions/Progress Updates:    Pt greeted supine in bed, agreeable to PT session. No reports of pain or dizziness. Bed mobiltiy completed with supervision. Donned hospital socks while seated EOB with setupA without LOB. Stand<>pivot transfer with CGA and no AD from EOB to w/c and transported to main rehab gym for time management. Completed 1x10 sit<>stands with supervision and no AD - truncal ataxia noted. Introduced [employer] med ball to perform NMR for trunk control with 4inch platform under feet to improve sitting posture and support: -3x10 chop/lift patterns with AAROM for end range -3x10 palloff press  -2x10 alternating thoracic rotation  Gait training ~247ft with RW and minA - ataxic gait with mild scissoring and narrow BOS. Introduced 1.5# ankle weight bilaterally and ambulated the same distance with CGA/minA and RW - improved BLE coordination with addition of ankle weights and less unsteadiness.   Alternating step up/downs onto 6inch stair with bilateral hand rail support with 1.5# ankle weights bilaterally, CGA for steadying - emphasis on BLE coordination.   Performed countertop push-ups, 3x10, with CGA, working on proprioceptive feedback for RUE.  Pt reporting need to void. W/c transport for urgency back to room. Ambulated short distances in room with modA and no AD to the toilet and needed minA for balance while she managed lower body dressing. Called NT for handoff  of care to assist with toileting needs due to time constraints. NT present at end of session.     Therapy Documentation Precautions:  Precautions Precautions: Fall Precaution Comments: Diplopia, trunk/RUE/LE ataxic Restrictions Weight Bearing Restrictions: No General:    Therapy/Group: Individual Therapy  Orrin Brigham 03/03/2021, 7:34 AM

## 2021-03-03 NOTE — Progress Notes (Signed)
STROKE TEAM PROGRESS NOTE   SUBJECTIVE (INTERVAL HISTORY) PT/OT is at the bedside. Pt awake alert and good spirit, no vertigo or N/V, working with therapist well. Still complaining of diplopia on both directions. Repeat CT showed new large right PICA infarct in addition to hemorrhagic infarct at right SCA. Will check CTA head and neck and MRI.   OBJECTIVE Temp:  [98.3 F (36.8 C)-99.2 F (37.3 C)] 98.3 F (36.8 C) (05/18 1414) Pulse Rate:  [74-103] 93 (05/18 1414) Resp:  [16] 16 (05/18 1414) BP: (124-138)/(75-87) 124/77 (05/18 1414) SpO2:  [98 %-99 %] 98 % (05/18 1414)  No results for input(s): GLUCAP in the last 168 hours. Recent Labs  Lab 02/25/21 0409 02/26/21 0132 03/01/21 0500  NA 137 137 136  K 3.1* 4.3 4.0  CL 106 105 105  CO2 GLUCOSE 118* 118* 106*  BUN CREATININE 0.63 0.61 0.62  CALCIUM 8.6* 9.0 8.7*  MG 2.0  --   --    Recent Labs  Lab 03/01/21 0500  AST 56*  ALT 47*  ALKPHOS 59  BILITOT 1.2  PROT 6.8  ALBUMIN 3.0*   Recent Labs  Lab 02/25/21 0409 03/01/21 0500  WBC 6.4 5.1  NEUTROABS  --  2.5  HGB 11.3* 11.1*  HCT 33.7* 33.9*  MCV 86.2 89.0  PLT 205 214   No results for input(s): CKTOTAL, CKMB, CKMBINDEX, TROPONINI in the last 168 hours. No results for input(s): LABPROT, INR in the last 72 hours. No results for input(s): COLORURINE, LABSPEC, PHURINE, GLUCOSEU, HGBUR, BILIRUBINUR, KETONESUR, PROTEINUR, UROBILINOGEN, NITRITE, LEUKOCYTESUR in the last 72 hours.  Invalid input(s): APPERANCEUR     Component Value Date/Time   CHOL 105 02/23/2021 0745   CHOL 191 09/09/2015 0804   TRIG 28 02/23/2021 0745   HDL 48 02/23/2021 0745   HDL 60 09/09/2015 0804   CHOLHDL 2.2 02/23/2021 0745   VLDL 6 02/23/2021 0745   LDLCALC 51 02/23/2021 0745   LDLCALC 120 (H) 09/09/2015 0804   Lab Results  Component Value Date   HGBA1C 6.0 (H) 02/23/2021      Component Value Date/Time   LABOPIA NONE DETECTED 02/23/2021 0033   COCAINSCRNUR  NONE DETECTED 02/23/2021 0033   LABBENZ NONE DETECTED 02/23/2021 0033   AMPHETMU NONE DETECTED 02/23/2021 0033   THCU NONE DETECTED 02/23/2021 0033   LABBARB NONE DETECTED 02/23/2021 0033    No results for input(s): ETH in the last 168 hours.  I have personally reviewed the radiological images below and agree with the radiology interpretations.  CT ANGIO HEAD NECK W WO CM  Result Date: 03/03/2021 CLINICAL DATA:  Stroke follow-up. EXAM: CT ANGIOGRAPHY HEAD AND NECK TECHNIQUE: Multidetector CT imaging of the head and neck was performed using the standard protocol during bolus administration of intravenous contrast. Multiplanar CT image reconstructions and MIPs were obtained to evaluate the vascular anatomy. Carotid stenosis measurements (when applicable) are obtained utilizing NASCET criteria, using the distal internal carotid diameter as the denominator. CONTRAST:  50mL OMNIPAQUE IOHEXOL 350 MG/ML SOLN COMPARISON:  CT angiogram of the head and neck Feb 23, 2021. FINDINGS: CTA NECK FINDINGS Aortic arch: 4 vessel aortic arch with direct origin of the left vertebral artery from the aortic arch in. Imaged portion shows no evidence of aneurysm or dissection. No significant stenosis of the major arch vessel origins. Right carotid system: Minimal atherosclerotic changes of the right carotid bifurcation without hemodynamically significant stenosis. Left carotid system: Mild atherosclerotic changes of  the left carotid bifurcation without hemodynamically significant stenosis. Vertebral arteries: Right dominant. The non dominant left vertebral artery has normal course and caliber from the aortic arch to the vertebrobasilar junction. Area of luminal irregularity is again seen in the V3 segment of the right vertebral artery now with mild stenosis followed by small (1-2 mm) outpouching suggesting pseudoaneurysm. There is also luminal irregularity at the V4 segment. Skeleton: Negative. Other neck: Negative. Upper chest:  Negative. Review of the MIP images confirms the above findings CTA HEAD FINDINGS Anterior circulation: No significant stenosis, proximal occlusion, aneurysm, or vascular malformation. Posterior circulation: Mild luminal irregularity of the V4 segment of the right vertebral artery which involves the origin of the right PICA which appear patent. The basilar artery is small in caliber without area of focal stenosis, likely related to the presence of bilateral prominent posterior communicating arteries. The right P1/PCA segment is absent (fetal PCA). Interval recanalization of the right superior cerebellar artery. Venous sinuses: As permitted by contrast timing, patent. Anatomic variants: Right fetal PCA. Prominent left posterior communicating artery. Review of the MIP images confirms the above findings IMPRESSION: 1. Luminal irregularity in the V3 and V4 segments of the right vertebral artery is suggestive of dissection without hemodynamically significant stenosis. Area of irregularity of the origin of the right PICA which remains patent. There is a new diminutive outpouching from the right V3 segment suggestive of a small pseudoaneurysm. 2. Interval recanalization of the right superior cerebellar artery. 3. No significant findings in the anterior circulation. Electronically Signed   By: Baldemar Lenis M.D.   On: 03/03/2021 12:54   CT HEAD WO CONTRAST  Addendum Date: 03/03/2021   ADDENDUM REPORT: 03/03/2021 08:53 ADDENDUM: Study discussed by telephone with Dr. [name] on 03/03/2021 at 0847 hours. Electronically Signed   By: Odessa Fleming M.D.   On: 03/03/2021 08:53   Result Date: 03/03/2021 CLINICAL DATA:  60 year old female with right SCA cerebellar infarct on MRI 02/23/2021. Subsequent encounter. EXAM: CT HEAD WITHOUT CONTRAST TECHNIQUE: Contiguous axial images were obtained from the base of the skull through the vertex without intravenous contrast. COMPARISON:  Head CT 03/01/2021 and earlier.  FINDINGS: Brain: Right superior cerebellar artery territory and cerebellar vermis cytotoxic edema with petechial hemorrhage. But new confluent cytotoxic edema now in the right PICA territory (series 3, image 5). Subsequent increased posterior fossa mass effect, with decreased prepontine and pre medullary cisterns. No tonsillar herniation. Increased effacement of the 4th ventricle, but lateral and 3rd ventricle size not significantly changed at this time. Supratentorial gray and white matter signal is stable. No new intracranial hemorrhage. Vascular: Mild Calcified atherosclerosis at the skull base. No suspicious intracranial vascular hyperdensity. Skull: No acute osseous abnormality identified. Small benign right frontal bone exostosis on series 4, image 59. Sinuses/Orbits: Stable mucosal thickening and bubbly opacity in the left sphenoid, left maxillary, and posterior right ethmoid sinuses. Tympanic cavities and mastoids remain clear. Other: Leftward gaze deviation. Visualized scalp soft tissues are within normal limits. IMPRESSION: 1. Confluent acute right PICA infarct is new since 03/01/2021, superimposed on the recent right SCA infarct. Subsequent increased cytotoxic edema in the right cerebellum with increased posterior fossa mass effect including effaced 4th ventricle. But no malignant hemorrhagic transformation or ventriculomegaly at this time. 2. Supratentorial brain appears stable, negative. Electronically Signed: By: Odessa Fleming M.D. On: 03/03/2021 08:42   CT HEAD WO CONTRAST  Result Date: 03/01/2021 CLINICAL DATA:  Stroke follow-up. EXAM: CT HEAD WITHOUT CONTRAST TECHNIQUE: Contiguous axial images were  obtained from the base of the skull through the vertex without intravenous contrast. COMPARISON:  CT head and MRI Feb 23, 2021. FINDINGS: Brain: Increased edema associated with the right cerebellar and right dorsal midbrain infarct. Increased resulting mass effect on the fourth ventricle without  hydrocephalus. Interval development of acute hemorrhage at the site of right cerebellar infarct. No evidence of new/interval acute large vascular territory infarct. Remote left cerebellar infarct. Basal cisterns are patent. No inferior cerebellar tonsillar herniation or midline shift. No mass lesion. Vascular: No hyperdense vessel identified. Skull: No acute fracture. Sinuses/Orbits: Mucosal thickening the left maxillary sinus, right posterior ethmoid air cell and left sphenoid sinus with frothy secretions in the sinuses. Unremarkable orbits. Other: No mastoid effusions. IMPRESSION: 1. Increased edema associated with the right cerebellar and right dorsal midbrain infarcts with interval development of acute hemorrhage at the site of right cerebellar infarct. Increased resulting mass effect on the fourth ventricle without hydrocephalus. 2. Paranasal sinus mucosal thickening with frothy secretions, as detailed above. Electronically Signed   By: Feliberto Harts MD   On: 03/01/2021 14:39   MR BRAIN WO CONTRAST  Result Date: 03/03/2021 CLINICAL DATA:  Stroke follow-up EXAM: MRI HEAD WITHOUT CONTRAST TECHNIQUE: Multiplanar, multiecho pulse sequences of the brain and surrounding structures were obtained without intravenous contrast. COMPARISON:  Head CT Mar 03, 2021. FINDINGS: Brain: Large area of restricted diffusion involving the inferior right cerebellar hemisphere consistent with acute/subacute right PICA territory infarct. Patchy T1 hyperintensity with prominent susceptibility artifact in the superior aspect of the right cerebellar hemisphere with surrounding vasogenic edema is suggestive of hemorrhagic transformation of non prior right SCA territory infarct. Edema and mass effect from these 2 areas of infarcts resulting in leftward midline shift with mass effect on the fourth ventricle which remains patent as well as mass effect on the posterior aspect of the pons and mid brain with mild periaqueductal edema and  small ascending transtentorial herniation. No tonsillar herniation. No hydrocephalus. Overall, findings are similar to prior CT performed earlier today Area of encephalomalacia and gliosis with hemosiderin staining in the superior left cerebellar hemisphere consistent with chronic left SCA territory infarct. Vascular: Normal flow voids. Skull and upper cervical spine: Normal marrow signal. Sinuses/Orbits: Mucosal thickening with bubbly secretion in the left maxillary and sphenoid sinuses. The orbits are maintained. Other: Right mastoid effusion. IMPRESSION: Large acute/subacute right PICA territory infarct and late subacute right SCA territory infarct with superimposed hemorrhagic transformation. Edema from of right cerebellar hemisphere infarcts results in leftward midline shift with mass effect on the fourth ventricle, mid brain and pons without evidence of hydrocephalus. Findings appear stable compared to head CT performed earlier today. Electronically Signed   By: Baldemar Lenis M.D.   On: 03/03/2021 13:13   MR BRAIN WO CONTRAST  Result Date: 02/23/2021 CLINICAL DATA:  Dizziness, nausea and vomiting. EXAM: MRI HEAD WITHOUT CONTRAST TECHNIQUE: Multiplanar, multiecho pulse sequences of the brain and surrounding structures were obtained without intravenous contrast. COMPARISON:  CT studies same day FINDINGS: Brain: Diffusion imaging shows a 4-5 cm region of acute infarction within the superior cerebellum on the right. Acute infarction also within the dorsal right midbrain. Old infarction within the left superior cerebellum. Mild swelling but no acute hemorrhage in the right cerebellum. Old hemosiderin residual within the left cerebellar stroke. Cerebral hemispheres are normal. No mass, hydrocephalus or extra-axial collection. Vascular: No acute vascular finding by standard MRI. Skull and upper cervical spine: Negative Sinuses/Orbits: Mucosal inflammatory changes of the left maxillary and  sphenoid  sinuses. Orbits negative. Other: None IMPRESSION: 4-5 cm region of acute infarction within the superior cerebellum on the right. Punctate acute infarction in the right dorsal midbrain. Mild swelling. No evidence of acute hemorrhage. Old infarction in the left superior cerebellum with atrophy, encephalomalacia and gliosis and hemosiderin deposition. Electronically Signed   By: Paulina FusiMark  Shogry M.D.   On: 02/23/2021 08:42   CUP PACEART REMOTE DEVICE CHECK  Result Date: 02/09/2021 ILR summary report received. Battery status OK. Normal device function. No new symptom, tachy, brady, or pause episodes. No new AF episodes. Monthly summary reports and ROV/PRN.  RP  CT HEAD CODE STROKE WO CONTRAST  Result Date: 02/23/2021 CLINICAL DATA:  Code stroke. Initial evaluation for neuro deficit, stroke suspected. EXAM: CT HEAD WITHOUT CONTRAST TECHNIQUE: Contiguous axial images were obtained from the base of the skull through the vertex without intravenous contrast. COMPARISON:  Prior CT from 01/31/2020. FINDINGS: Brain: Streak/beam hardening artifact emanating from the left occipital calvarium noted, mildly limiting assessment. Cerebral volume within normal limits. Focal encephalomalacia at the central and left superior cerebellum, consistent with a chronic left MCA distribution infarct. Subtle evolving hypodensity noted involving the superior right cerebellum and cerebellar vermis, concerning for an acute right SCA distribution infarct (series 3, image 12, 11). No associated hemorrhage or mass effect. No other evidence for acute large vessel territory infarct. No intracranial hemorrhage. No mass lesion, mass effect or midline shift. No hydrocephalus or extra-axial fluid collection. Vascular: No hyperdense vessel. Skull: Scalp soft tissues demonstrate no acute finding. Small focal exostosis noted at the right frontal calvarium. Calvarium intact. Sinuses/Orbits: Globes and orbital soft tissues within normal limits. Moderate left  sphenoid and maxillary sinusitis. Additional scattered pneumatized secretions noted within the ethmoidal air cells. Subcentimeter osteoma noted within the right ethmoidal air cells as well. Mastoid air cells are clear. Other: None. ASPECTS [facility]([place] Stroke Program Early CT Score) - Ganglionic level infarction (caudate, lentiform nuclei, internal capsule, insula, M1-M3 cortex): 7 - Supraganglionic infarction (M4-M6 cortex): 3 Total score (0-10 with 10 being normal): 10 IMPRESSION: 1. Evolving hypodensity involving the superior right cerebellum/cerebellar vermis, concerning for an acute right SCA distribution infarct. No intracranial hemorrhage or mass effect. 2. ASPECTS is 10. 3. Underlying chronic left SCA distribution infarcts. Critical Value/emergent results were called by telephone at the time of interpretation on 02/23/2021 at 12:55 a.m. to provider Dr. Amada JupiterKirkpatrick, Who verbally acknowledged these results. Electronically Signed   By: Rise MuBenjamin  McClintock M.D.   On: 02/23/2021 01:23   CT ANGIO HEAD NECK W WO CM (CODE STROKE)  Result Date: 02/23/2021 CLINICAL DATA:  Initial evaluation for neuro deficit, stroke suspected. EXAM: CT ANGIOGRAPHY HEAD AND NECK TECHNIQUE: Multidetector CT imaging of the head and neck was performed using the standard protocol during bolus administration of intravenous contrast. Multiplanar CT image reconstructions and MIPs were obtained to evaluate the vascular anatomy. Carotid stenosis measurements (when applicable) are obtained utilizing NASCET criteria, using the distal internal carotid diameter as the denominator. CONTRAST:  75mL OMNIPAQUE IOHEXOL 350 MG/ML SOLN COMPARISON:  Prior head CT from earlier the same day. FINDINGS: CTA NECK FINDINGS Aortic arch: Visualized aortic arch normal in caliber with normal branch pattern. No stenosis about the origin of the great vessels. Right carotid system: Right common carotid artery patent from its origin to the bifurcation without stenosis.  Minimal plaque about the right carotid bulb without stenosis. Right ICA patent distally without stenosis, dissection or occlusion. Left carotid system: Left CCA patent from its origin to the  bifurcation without stenosis. Minimal plaque about the left carotid bulb without significant stenosis. Left ICA patent distally without stenosis, dissection or occlusion. Vertebral arteries: Left vertebral artery arises directly from the aortic arch. Right vertebral artery dominant. Vertebral arteries widely patent within the neck. Just prior to the skull base/dural reflection, there is distal right V3 segment (series 9, image 83), suspicious for a possible short-segment dissection. Small amount of probable intraluminal thrombus protrudes into the vascular lumen at this level. No frank raised dissection flap is seen. No more than mild stenosis at this level. The right vertebral artery is relatively normal just distally as it crosses into the cranial vault. Skeleton: No acute osseous finding. No discrete or worrisome osseous lesions. Mild cervical spondylosis noted at C5-6 without significant stenosis. Other neck: No other acute soft tissue abnormality within the neck. No mass or adenopathy. Upper chest: Visualized upper chest demonstrates no acute finding. Review of the MIP images confirms the above findings CTA HEAD FINDINGS Anterior circulation: Petrous, cavernous, and supraclinoid segments patent without stenosis or other abnormality. A1 segments widely patent. Normal anterior communicating artery complex. Anterior cerebral arteries patent to their distal aspects without stenosis. No M1 stenosis or occlusion. Normal MCA bifurcations. Distal MCA branches well perfused and symmetric. Posterior circulation: Both V4 segments patent to the vertebrobasilar junction without stenosis. Both PICA origins patent and normal. Basilar somewhat diminutive but patent to its distal aspect without stenosis. Left superior cerebral artery  patent. There is occlusion of the mid-distal right superior cerebellar artery, likely embolic (series 8, image 105). Left PCA supplied via the basilar as well as a prominent left posterior communicating artery. Predominant fetal type origin of the right PCA. Both PCAs remain widely patent to their distal aspects. Venous sinuses: Grossly patent allowing for timing the contrast bolus. Anatomic variants: Predominant fetal type origin of the right PCA. Review of the MIP images confirms the above findings IMPRESSION: 1. Focal intimal irregularity involving the distal right V3 segment, likely reflecting a short-segment dissection. No frank raised dissection flap or significant luminal narrowing. 2. 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 3. Mild for age atheromatous disease elsewhere about the major arterial vasculature of the head and neck. No other hemodynamically significant or correctable stenosis. These results were communicated to Dr. Amada Jupiter at 1:22 amon 5/10/2022by text page via the [facility] messaging system. Electronically Signed   By: Rise Mu M.D.   On: 02/23/2021 01:38    PHYSICAL EXAM  Temp:  [98.3 F (36.8 C)-99.2 F (37.3 C)] 98.3 F (36.8 C) (05/18 1414) Pulse Rate:  [74-103] 93 (05/18 1414) Resp:  [16] 16 (05/18 1414) BP: (124-138)/(75-87) 124/77 (05/18 1414) SpO2:  [98 %-99 %] 98 % (05/18 1414)  General - well nourished, well developed, in no apparent distress.    Ophthalmologic - fundi not visualized due to noncooperation.    Cardiovascular - regular rhythm and rate  Mental Status -  Level of arousal and orientation to time, place, and person were intact. Language including expression, naming, repetition, comprehension, reading, and writing was assessed and found intact Fund of Knowledge was assessed and was intact.  Cranial Nerves II - XII - II - Vision intact OU. III, IV, VI - Extraocular movements intact,  however, subjective diplopia bilateral gaze V - Facial sensation intact bilaterally. VII - Facial movement intact bilaterally. VIII - mild bidirectional nystagmus on bilateral gaze, L>R X - Palate elevates symmetrically. XI - Chin turning & shoulder shrug intact bilaterally. XII -  Tongue protrusion intact.  Motor Strength - The patient's strength was normal in all extremities and pronator drift was absent.   Motor Tone & Bulk - Muscle tone was assessed at the neck and appendages and was normal.  Bulk was normal and fasciculations were absent.   Reflexes - The patient's reflexes were normal in all extremities and she had no pathological reflexes.  Sensory - Light touch, temperature/pinprick were assessed and were normal.    Coordination - The patient had normal movements in the left hand and feet with no ataxia or dysmetria.  However right finger-to-nose dysmetria.  Tremor was absent.  Gait and Station - deferred   ASSESSMENT/PLAN [name] is a 60 y.o. female with history of recurrent stroke, hypertension admitted on 02/26/2021 for rehab due to right cerebellar infarct. She has been working with therapist pretty well resolved vertigo or N/V. however, developed acute onset vertigo and N/V on 03/01/21. Stroke team was called back for evaluation.   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    MRI confirmed right superior cerebellum moderate infarct and punctate dorsal midbrain infarct.    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    CT head 5/16 right cerebellar infarct with increased edema and hemorrhagic conversion  CT repeat 5/18 new right PICA large infarct, stable right SCA hemorrhagic infarct  CTA head and neck - right V3 and V4 luminal irregularity,  suggestive dissection, new V3 outpouching suggestive small pseudoaneurysm  MRI brain - Large acute/subacute right PICA territory infarct and late subacute right SCA territory infarct with superimposed hemorrhagic transformation. Edema from of right cerebellar hemisphere infarcts results in leftward midline shift with mass effect on the fourth ventricle, mid brain and pons without evidence of hydrocephalus.  CT head repeat in am  Loop recorder interrogation negative.    LDL 51  A1c 6.0.   echo pending.    Hypercoagulable work-up repeated again negative.    Was on aspirin 81 and the Brilinta 90 twice daily for 4 weeks and then Brilinta alone. Given hemorrhagic conversion, brilinta discontinued, continue ASA 81. With new infarct and possible dissection, will add plavix from tomorrow  Ongoing aggressive stroke risk factor management  Therapy recommendations:  CIR  Disposition:  Pending   Hx of stroke  left cerebellar stroke in 01/2020 status post tPA admitted to [facility].  At that time, CTA head and neck no LVO or stenosis, MRI showed left superior cerebellar infarct.  EF normal range.  TCD bubble study negative.  LDL 138, A1c 6.0.  Hypercoagulable work-up negative.  Bilateral lower extremity DVT negative.  Discharged with DAPT for 3 weeks and then aspirin alone, as well as Lipitor 40.  After discharge she follow-up with Dr. [name] at [facility], had loop recorder placed in 02/2020.  Hypertension  Stable  Resumed amlodipine 5mg   BP goal < 160 given hemorrhagic conversion  Long-term BP goal normotensive  Hyperlipidemia  Home meds:  Lipitor 40mg   LDL 51, at goal < 70  Continue lipitor 40  Continue statin at discharge  Other Stroke Risk Factors    Other Active Problems    Hospital day # 5  I discussed with Dr. . I spent  35 minutes in total face-to-face time with the patient, more than 50% of which was spent in counseling and  coordination of care, reviewing test results,  images and medication, and discussing the diagnosis, treatment plan and potential prognosis. This patient's care requiresreview of multiple databases, neurological assessment, discussion with family, other specialists and medical decision making of high complexity.  [name], MD PhD Stroke Neurology 03/03/2021 2:52 PM    To contact Stroke Continuity provider, please refer to [email or web address]. After hours, contact General Neurology

## 2021-03-03 NOTE — Progress Notes (Signed)
Patient ID: Judy Gutierrez, female   DOB: July 29, 1961, 60 y.o.   MRN: [id number]  Met with husbamd who was in wife's room, wife at a test and out of the room. To update him regarding team conference supervision-min assist level goals and target discharge date 5/26. He had difficulty understanding pt will need 24/7 care and will be long term, since she made good progress before and could be safe home alone. Discussed coming in for education and scheduled for Wed-5/25 @ 1:00-3:00 pm with him. He then will see the care wife requires and the safety concerns. He wants her to have home health will try to find an agency that will accept their insurance. Will work on discharge needs.

## 2021-03-04 ENCOUNTER — Inpatient Hospital Stay (HOSPITAL_COMMUNITY): Payer: No Typology Code available for payment source

## 2021-03-04 DIAGNOSIS — Z8673 Personal history of transient ischemic attack (TIA), and cerebral infarction without residual deficits: Secondary | ICD-10-CM | POA: Diagnosis not present

## 2021-03-04 DIAGNOSIS — I63441 Cerebral infarction due to embolism of right cerebellar artery: Secondary | ICD-10-CM | POA: Diagnosis not present

## 2021-03-04 NOTE — Progress Notes (Signed)
Husband requesting for CT explanation done 5/18. Dan PA notified and suggested to text  Dr. Jerel Shepherd via [place].  RN texted nuerologist via amion.

## 2021-03-04 NOTE — Progress Notes (Signed)
STROKE TEAM PROGRESS NOTE   SUBJECTIVE (INTERVAL HISTORY) No family at bedside.  Patient stated that her diplopia are stable, continue work with PT/OT.  Still has right arm dysmetria but also stable.  CT repeat this morning stable cerebellar strokes, no hydrocephalus.  I had long discussion with husband and family physician friend over the phone, updated pt current condition, treatment plan and potential prognosis, and answered all the questions.  They expressed understanding and appreciation.   OBJECTIVE Temp:  [98.3 F (36.8 C)-98.9 F (37.2 C)] 98.9 F (37.2 C) (05/19 1350) Pulse Rate:  [76-95] 87 (05/19 1350) Resp:  [17-20] 17 (05/19 1350) BP: (131-138)/(76-89) 131/77 (05/19 1350) SpO2:  [98 %-100 %] 98 % (05/19 1350)  No results for input(s): GLUCAP in the last 168 hours. Recent Labs  Lab 02/26/21 0132 03/01/21 0500  NA 137 136  K 4.3 4.0  CL 105 105  CO2 27 26  GLUCOSE 118* 106*  BUN 15 16  CREATININE 0.61 0.62  CALCIUM 9.0 8.7*   Recent Labs  Lab 03/01/21 0500  AST 56*  ALT 47*  ALKPHOS 59  BILITOT 1.2  PROT 6.8  ALBUMIN 3.0*   Recent Labs  Lab 03/01/21 0500  WBC 5.1  NEUTROABS 2.5  HGB 11.1*  HCT 33.9*  MCV 89.0  PLT 214   No results for input(s): CKTOTAL, CKMB, CKMBINDEX, TROPONINI in the last 168 hours. No results for input(s): LABPROT, INR in the last 72 hours. No results for input(s): COLORURINE, LABSPEC, PHURINE, GLUCOSEU, HGBUR, BILIRUBINUR, KETONESUR, PROTEINUR, UROBILINOGEN, NITRITE, LEUKOCYTESUR in the last 72 hours.  Invalid input(s): APPERANCEUR     Component Value Date/Time   CHOL 105 02/23/2021 0745   CHOL 191 09/09/2015 0804   TRIG 28 02/23/2021 0745   HDL 48 02/23/2021 0745   HDL 60 09/09/2015 0804   CHOLHDL 2.2 02/23/2021 0745   VLDL 6 02/23/2021 0745   LDLCALC 51 02/23/2021 0745   LDLCALC 120 (H) 09/09/2015 0804   Lab Results  Component Value Date   HGBA1C 6.0 (H) 02/23/2021      Component Value Date/Time   LABOPIA  NONE DETECTED 02/23/2021 0033   COCAINSCRNUR NONE DETECTED 02/23/2021 0033   LABBENZ NONE DETECTED 02/23/2021 0033   AMPHETMU NONE DETECTED 02/23/2021 0033   THCU NONE DETECTED 02/23/2021 0033   LABBARB NONE DETECTED 02/23/2021 0033    No results for input(s): ETH in the last 168 hours.  I have personally reviewed the radiological images below and agree with the radiology interpretations.  CT ANGIO HEAD NECK W WO CM  Result Date: 03/03/2021 CLINICAL DATA:  Stroke follow-up. EXAM: CT ANGIOGRAPHY HEAD AND NECK TECHNIQUE: Multidetector CT imaging of the head and neck was performed using the standard protocol during bolus administration of intravenous contrast. Multiplanar CT image reconstructions and MIPs were obtained to evaluate the vascular anatomy. Carotid stenosis measurements (when applicable) are obtained utilizing NASCET criteria, using the distal internal carotid diameter as the denominator. CONTRAST:  50mL OMNIPAQUE IOHEXOL 350 MG/ML SOLN COMPARISON:  CT angiogram of the head and neck Feb 23, 2021. FINDINGS: CTA NECK FINDINGS Aortic arch: 4 vessel aortic arch with direct origin of the left vertebral artery from the aortic arch in. Imaged portion shows no evidence of aneurysm or dissection. No significant stenosis of the major arch vessel origins. Right carotid system: Minimal atherosclerotic changes of the right carotid bifurcation without hemodynamically significant stenosis. Left carotid system: Mild atherosclerotic changes of the left carotid bifurcation without hemodynamically significant stenosis. Vertebral arteries:  Right dominant. The non dominant left vertebral artery has normal course and caliber from the aortic arch to the vertebrobasilar junction. Area of luminal irregularity is again seen in the V3 segment of the right vertebral artery now with mild stenosis followed by small (1-2 mm) outpouching suggesting pseudoaneurysm. There is also luminal irregularity at the V4 segment.  Skeleton: Negative. Other neck: Negative. Upper chest: Negative. Review of the MIP images confirms the above findings CTA HEAD FINDINGS Anterior circulation: No significant stenosis, proximal occlusion, aneurysm, or vascular malformation. Posterior circulation: Mild luminal irregularity of the V4 segment of the right vertebral artery which involves the origin of the right PICA which appear patent. The basilar artery is small in caliber without area of focal stenosis, likely related to the presence of bilateral prominent posterior communicating arteries. The right P1/PCA segment is absent (fetal PCA). Interval recanalization of the right superior cerebellar artery. Venous sinuses: As permitted by contrast timing, patent. Anatomic variants: Right fetal PCA. Prominent left posterior communicating artery. Review of the MIP images confirms the above findings IMPRESSION: 1. Luminal irregularity in the V3 and V4 segments of the right vertebral artery is suggestive of dissection without hemodynamically significant stenosis. Area of irregularity of the origin of the right PICA which remains patent. There is a new diminutive outpouching from the right V3 segment suggestive of a small pseudoaneurysm. 2. Interval recanalization of the right superior cerebellar artery. 3. No significant findings in the anterior circulation. Electronically Signed   By: Baldemar Lenis M.D.   On: 03/03/2021 12:54   CT HEAD WO CONTRAST  Result Date: 03/04/2021 CLINICAL DATA:  60 year old female with new right PICA infarct superimposed on recent right SCA infarct. EXAM: CT HEAD WITHOUT CONTRAST TECHNIQUE: Contiguous axial images were obtained from the base of the skull through the vertex without intravenous contrast. COMPARISON:  Brain MRI, CTA head and neck, CT head yesterday. FINDINGS: Brain: Supratentorial gray-white matter differentiation remains stable. And the lateral and 3rd ventricle size and configuration is stable with  temporal horns remaining diminutive on series 3, image 9. Subacute right SCA infarct with petechial hemorrhage and more acute confluent right PICA infarct with cytotoxic edema redemonstrated. Stable posterior fossa mass effect including on the 4th ventricle. Superimposed chronic left SCA cerebellar infarct. Posterior fossa gray-white matter differentiation and basilar cisterns are stable since 0645 hours yesterday. Vascular: Mild Calcified atherosclerosis at the skull base. Skull: Stable.  No acute osseous abnormality identified. Sinuses/Orbits: Stable occasional sinus mucosal thickening and bubbly opacity. Tympanic cavities and mastoids remain clear. Other: Leftward gaze deviation as before. Visualized scalp soft tissues are within normal limits. IMPRESSION: 1. Stable since yesterday. Confluent Right PICA infarct superimposed on subacute Right SCA infarct with petechial hemorrhage. Stable posterior fossa mass effect. No lateral or 3rd ventriculomegaly at this time. 2. No new intracranial abnormality. Electronically Signed   By: Odessa Fleming M.D.   On: 03/04/2021 06:08   CT HEAD WO CONTRAST  Addendum Date: 03/03/2021   ADDENDUM REPORT: 03/03/2021 08:53 ADDENDUM: Study discussed by telephone with Dr. [name] on 03/03/2021 at 0847 hours. Electronically Signed   By: Odessa Fleming M.D.   On: 03/03/2021 08:53   Result Date: 03/03/2021 CLINICAL DATA:  60 year old female with right SCA cerebellar infarct on MRI 02/23/2021. Subsequent encounter. EXAM: CT HEAD WITHOUT CONTRAST TECHNIQUE: Contiguous axial images were obtained from the base of the skull through the vertex without intravenous contrast. COMPARISON:  Head CT 03/01/2021 and earlier. FINDINGS: Brain: Right superior cerebellar artery territory  and cerebellar vermis cytotoxic edema with petechial hemorrhage. But new confluent cytotoxic edema now in the right PICA territory (series 3, image 5). Subsequent increased posterior fossa mass effect, with decreased prepontine  and pre medullary cisterns. No tonsillar herniation. Increased effacement of the 4th ventricle, but lateral and 3rd ventricle size not significantly changed at this time. Supratentorial gray and white matter signal is stable. No new intracranial hemorrhage. Vascular: Mild Calcified atherosclerosis at the skull base. No suspicious intracranial vascular hyperdensity. Skull: No acute osseous abnormality identified. Small benign right frontal bone exostosis on series 4, image 59. Sinuses/Orbits: Stable mucosal thickening and bubbly opacity in the left sphenoid, left maxillary, and posterior right ethmoid sinuses. Tympanic cavities and mastoids remain clear. Other: Leftward gaze deviation. Visualized scalp soft tissues are within normal limits. IMPRESSION: 1. Confluent acute right PICA infarct is new since 03/01/2021, superimposed on the recent right SCA infarct. Subsequent increased cytotoxic edema in the right cerebellum with increased posterior fossa mass effect including effaced 4th ventricle. But no malignant hemorrhagic transformation or ventriculomegaly at this time. 2. Supratentorial brain appears stable, negative. Electronically Signed: By: Odessa Fleming M.D. On: 03/03/2021 08:42   CT HEAD WO CONTRAST  Result Date: 03/01/2021 CLINICAL DATA:  Stroke follow-up. EXAM: CT HEAD WITHOUT CONTRAST TECHNIQUE: Contiguous axial images were obtained from the base of the skull through the vertex without intravenous contrast. COMPARISON:  CT head and MRI Feb 23, 2021. FINDINGS: Brain: Increased edema associated with the right cerebellar and right dorsal midbrain infarct. Increased resulting mass effect on the fourth ventricle without hydrocephalus. Interval development of acute hemorrhage at the site of right cerebellar infarct. No evidence of new/interval acute large vascular territory infarct. Remote left cerebellar infarct. Basal cisterns are patent. No inferior cerebellar tonsillar herniation or midline shift. No mass  lesion. Vascular: No hyperdense vessel identified. Skull: No acute fracture. Sinuses/Orbits: Mucosal thickening the left maxillary sinus, right posterior ethmoid air cell and left sphenoid sinus with frothy secretions in the sinuses. Unremarkable orbits. Other: No mastoid effusions. IMPRESSION: 1. Increased edema associated with the right cerebellar and right dorsal midbrain infarcts with interval development of acute hemorrhage at the site of right cerebellar infarct. Increased resulting mass effect on the fourth ventricle without hydrocephalus. 2. Paranasal sinus mucosal thickening with frothy secretions, as detailed above. Electronically Signed   By: Feliberto Harts MD   On: 03/01/2021 14:39   MR BRAIN WO CONTRAST  Result Date: 03/03/2021 CLINICAL DATA:  Stroke follow-up EXAM: MRI HEAD WITHOUT CONTRAST TECHNIQUE: Multiplanar, multiecho pulse sequences of the brain and surrounding structures were obtained without intravenous contrast. COMPARISON:  Head CT Mar 03, 2021. FINDINGS: Brain: Large area of restricted diffusion involving the inferior right cerebellar hemisphere consistent with acute/subacute right PICA territory infarct. Patchy T1 hyperintensity with prominent susceptibility artifact in the superior aspect of the right cerebellar hemisphere with surrounding vasogenic edema is suggestive of hemorrhagic transformation of non prior right SCA territory infarct. Edema and mass effect from these 2 areas of infarcts resulting in leftward midline shift with mass effect on the fourth ventricle which remains patent as well as mass effect on the posterior aspect of the pons and mid brain with mild periaqueductal edema and small ascending transtentorial herniation. No tonsillar herniation. No hydrocephalus. Overall, findings are similar to prior CT performed earlier today Area of encephalomalacia and gliosis with hemosiderin staining in the superior left cerebellar hemisphere consistent with chronic left SCA  territory infarct. Vascular: Normal flow voids. Skull and upper cervical spine: Normal  marrow signal. Sinuses/Orbits: Mucosal thickening with bubbly secretion in the left maxillary and sphenoid sinuses. The orbits are maintained. Other: Right mastoid effusion. IMPRESSION: Large acute/subacute right PICA territory infarct and late subacute right SCA territory infarct with superimposed hemorrhagic transformation. Edema from of right cerebellar hemisphere infarcts results in leftward midline shift with mass effect on the fourth ventricle, mid brain and pons without evidence of hydrocephalus. Findings appear stable compared to head CT performed earlier today. Electronically Signed   By: Baldemar LenisKatyucia  De Macedo Rodrigues M.D.   On: 03/03/2021 13:13   MR BRAIN WO CONTRAST  Result Date: 02/23/2021 CLINICAL DATA:  Dizziness, nausea and vomiting. EXAM: MRI HEAD WITHOUT CONTRAST TECHNIQUE: Multiplanar, multiecho pulse sequences of the brain and surrounding structures were obtained without intravenous contrast. COMPARISON:  CT studies same day FINDINGS: Brain: Diffusion imaging shows a 4-5 cm region of acute infarction within the superior cerebellum on the right. Acute infarction also within the dorsal right midbrain. Old infarction within the left superior cerebellum. Mild swelling but no acute hemorrhage in the right cerebellum. Old hemosiderin residual within the left cerebellar stroke. Cerebral hemispheres are normal. No mass, hydrocephalus or extra-axial collection. Vascular: No acute vascular finding by standard MRI. Skull and upper cervical spine: Negative Sinuses/Orbits: Mucosal inflammatory changes of the left maxillary and sphenoid sinuses. Orbits negative. Other: None IMPRESSION: 4-5 cm region of acute infarction within the superior cerebellum on the right. Punctate acute infarction in the right dorsal midbrain. Mild swelling. No evidence of acute hemorrhage. Old infarction in the left superior cerebellum with  atrophy, encephalomalacia and gliosis and hemosiderin deposition. Electronically Signed   By: Paulina FusiMark  Shogry M.D.   On: 02/23/2021 08:42   CUP PACEART REMOTE DEVICE CHECK  Result Date: 02/09/2021 ILR summary report received. Battery status OK. Normal device function. No new symptom, tachy, brady, or pause episodes. No new AF episodes. Monthly summary reports and ROV/PRN.  RP  CT HEAD CODE STROKE WO CONTRAST  Result Date: 02/23/2021 CLINICAL DATA:  Code stroke. Initial evaluation for neuro deficit, stroke suspected. EXAM: CT HEAD WITHOUT CONTRAST TECHNIQUE: Contiguous axial images were obtained from the base of the skull through the vertex without intravenous contrast. COMPARISON:  Prior CT from 01/31/2020. FINDINGS: Brain: Streak/beam hardening artifact emanating from the left occipital calvarium noted, mildly limiting assessment. Cerebral volume within normal limits. Focal encephalomalacia at the central and left superior cerebellum, consistent with a chronic left MCA distribution infarct. Subtle evolving hypodensity noted involving the superior right cerebellum and cerebellar vermis, concerning for an acute right SCA distribution infarct (series 3, image 12, 11). No associated hemorrhage or mass effect. No other evidence for acute large vessel territory infarct. No intracranial hemorrhage. No mass lesion, mass effect or midline shift. No hydrocephalus or extra-axial fluid collection. Vascular: No hyperdense vessel. Skull: Scalp soft tissues demonstrate no acute finding. Small focal exostosis noted at the right frontal calvarium. Calvarium intact. Sinuses/Orbits: Globes and orbital soft tissues within normal limits. Moderate left sphenoid and maxillary sinusitis. Additional scattered pneumatized secretions noted within the ethmoidal air cells. Subcentimeter osteoma noted within the right ethmoidal air cells as well. Mastoid air cells are clear. Other: None. ASPECTS [facility]([place] Stroke Program Early CT Score) -  Ganglionic level infarction (caudate, lentiform nuclei, internal capsule, insula, M1-M3 cortex): 7 - Supraganglionic infarction (M4-M6 cortex): 3 Total score (0-10 with 10 being normal): 10 IMPRESSION: 1. Evolving hypodensity involving the superior right cerebellum/cerebellar vermis, concerning for an acute right SCA distribution infarct. No intracranial hemorrhage or mass effect. 2.  ASPECTS is 10. 3. Underlying chronic left SCA distribution infarcts. Critical Value/emergent results were called by telephone at the time of interpretation on 02/23/2021 at 12:55 a.m. to provider Dr. Amada Jupiter, Who verbally acknowledged these results. Electronically Signed   By: Rise Mu M.D.   On: 02/23/2021 01:23   CT ANGIO HEAD NECK W WO CM (CODE STROKE)  Result Date: 02/23/2021 CLINICAL DATA:  Initial evaluation for neuro deficit, stroke suspected. EXAM: CT ANGIOGRAPHY HEAD AND NECK TECHNIQUE: Multidetector CT imaging of the head and neck was performed using the standard protocol during bolus administration of intravenous contrast. Multiplanar CT image reconstructions and MIPs were obtained to evaluate the vascular anatomy. Carotid stenosis measurements (when applicable) are obtained utilizing NASCET criteria, using the distal internal carotid diameter as the denominator. CONTRAST:  75mL OMNIPAQUE IOHEXOL 350 MG/ML SOLN COMPARISON:  Prior head CT from earlier the same day. FINDINGS: CTA NECK FINDINGS Aortic arch: Visualized aortic arch normal in caliber with normal branch pattern. No stenosis about the origin of the great vessels. Right carotid system: Right common carotid artery patent from its origin to the bifurcation without stenosis. Minimal plaque about the right carotid bulb without stenosis. Right ICA patent distally without stenosis, dissection or occlusion. Left carotid system: Left CCA patent from its origin to the bifurcation without stenosis. Minimal plaque about the left carotid bulb without  significant stenosis. Left ICA patent distally without stenosis, dissection or occlusion. Vertebral arteries: Left vertebral artery arises directly from the aortic arch. Right vertebral artery dominant. Vertebral arteries widely patent within the neck. Just prior to the skull base/dural reflection, there is distal right V3 segment (series 9, image 83), suspicious for a possible short-segment dissection. Small amount of probable intraluminal thrombus protrudes into the vascular lumen at this level. No frank raised dissection flap is seen. No more than mild stenosis at this level. The right vertebral artery is relatively normal just distally as it crosses into the cranial vault. Skeleton: No acute osseous finding. No discrete or worrisome osseous lesions. Mild cervical spondylosis noted at C5-6 without significant stenosis. Other neck: No other acute soft tissue abnormality within the neck. No mass or adenopathy. Upper chest: Visualized upper chest demonstrates no acute finding. Review of the MIP images confirms the above findings CTA HEAD FINDINGS Anterior circulation: Petrous, cavernous, and supraclinoid segments patent without stenosis or other abnormality. A1 segments widely patent. Normal anterior communicating artery complex. Anterior cerebral arteries patent to their distal aspects without stenosis. No M1 stenosis or occlusion. Normal MCA bifurcations. Distal MCA branches well perfused and symmetric. Posterior circulation: Both V4 segments patent to the vertebrobasilar junction without stenosis. Both PICA origins patent and normal. Basilar somewhat diminutive but patent to its distal aspect without stenosis. Left superior cerebral artery patent. There is occlusion of the mid-distal right superior cerebellar artery, likely embolic (series 8, image 105). Left PCA supplied via the basilar as well as a prominent left posterior communicating artery. Predominant fetal type origin of the right PCA. Both PCAs remain  widely patent to their distal aspects. Venous sinuses: Grossly patent allowing for timing the contrast bolus. Anatomic variants: Predominant fetal type origin of the right PCA. Review of the MIP images confirms the above findings IMPRESSION: 1. Focal intimal irregularity involving the distal right V3 segment, likely reflecting a short-segment dissection. No frank raised dissection flap or significant luminal narrowing. 2. 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 3. Mild for age atheromatous disease elsewhere about the major arterial vasculature of  the head and neck. No other hemodynamically significant or correctable stenosis. These results were communicated to Dr. Amada Jupiter at 1:22 amon 5/10/2022by text page via the [facility] messaging system. Electronically Signed   By: Rise Mu M.D.   On: 02/23/2021 01:38    PHYSICAL EXAM  Temp:  [98.3 F (36.8 C)-98.9 F (37.2 C)] 98.9 F (37.2 C) (05/19 1350) Pulse Rate:  [76-95] 87 (05/19 1350) Resp:  [17-20] 17 (05/19 1350) BP: (131-138)/(76-89) 131/77 (05/19 1350) SpO2:  [98 %-100 %] 98 % (05/19 1350)  General - well nourished, well developed, in no apparent distress.    Ophthalmologic - fundi not visualized due to noncooperation.    Cardiovascular - regular rhythm and rate  Mental Status -  Level of arousal and orientation to time, place, and person were intact. Language including expression, naming, repetition, comprehension, reading, and writing was assessed and found intact Fund of Knowledge was assessed and was intact.  Cranial Nerves II - XII - II - Vision intact OU. III, IV, VI - Extraocular movements intact, however, subjective diplopia bilateral gaze V - Facial sensation intact bilaterally. VII - Facial movement intact bilaterally. VIII - mild bidirectional nystagmus on bilateral gaze, L>R X - Palate elevates symmetrically. XI - Chin turning & shoulder shrug intact  bilaterally. XII - Tongue protrusion intact.  Motor Strength - The patient's strength was normal in all extremities and pronator drift was absent.   Motor Tone & Bulk - Muscle tone was assessed at the neck and appendages and was normal.  Bulk was normal and fasciculations were absent.   Reflexes - The patient's reflexes were normal in all extremities and she had no pathological reflexes.  Sensory - Light touch, temperature/pinprick were assessed and were normal.    Coordination - The patient had normal movements in the left hand and feet with no ataxia or dysmetria.  However right finger-to-nose dysmetria.  Tremor was absent.  Gait and Station - deferred   ASSESSMENT/PLAN [name] is a 60 y.o. female with history of recurrent stroke, hypertension admitted on 02/26/2021 for rehab due to right cerebellar infarct. She has been working with therapist pretty well resolved vertigo or N/V. however, developed acute onset vertigo and N/V on 03/01/21. Stroke team was called back for evaluation.   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    MRI confirmed right superior cerebellum moderate infarct and punctate dorsal midbrain infarct.    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 Edema from of right cerebellar hemisphere  infarcts results in leftward midline shift with mass effect on the fourth ventricle, mid brain and pons without evidence of hydrocephalus.  CT head stable right PICA and SCA infarcts, no hydrocephalus.  Loop recorder interrogation negative.    LDL 51  A1c 6.0.   echo pending.    Hypercoagulable work-up repeated again negative.    Was on aspirin 81 and the  Brilinta 90 twice daily for 4 weeks and then Brilinta alone. Given hemorrhagic conversion, brilinta discontinued, continue ASA 81. With new infarct and possible dissection, add plavix today after CT stable.   Ongoing aggressive stroke risk factor management  Therapy recommendations:  CIR  Disposition:  Pending   Hx of stroke  left cerebellar stroke in 01/2020 status post tPA admitted to [facility].  At that time, CTA head and neck no LVO or stenosis, MRI showed left superior cerebellar infarct.  EF normal range.  TCD bubble study negative.  LDL 138, A1c 6.0.  Hypercoagulable work-up negative.  Bilateral lower extremity DVT negative.  Discharged with DAPT for 3 weeks and then aspirin alone, as well as Lipitor 40.  After discharge she follow-up with Dr. [name] at [facility], had loop recorder placed in 02/2020.  Hypertension  Stable  Resumed amlodipine   BP goal < 160 given hemorrhagic conversion  Long-term BP goal normotensive  Hyperlipidemia  Home meds:  Lipitor   LDL 51, at goal < 70  Continue lipitor 40  Continue statin at discharge  Other Stroke Risk Factors    Other Active Problems    Hospital day # 6  I had long discussion with husband and family physician friend over the phone, updated pt current condition, treatment plan and potential prognosis, and answered all the questions.  They expressed understanding and appreciation.   [name], MD PhD Stroke Neurology 03/04/2021 6:40 PM    To contact Stroke Continuity provider, please refer to [email or web address]. After hours, contact General Neurology

## 2021-03-04 NOTE — Progress Notes (Signed)
Physical Therapy Session Note  Patient Details  Name: Judy Gutierrez MRN: [id number] Date of Birth: [birth year]  Today's Date: 03/04/2021 PT Individual Time: 9518-8416 + 1345-1415  PT Individual Time Calculation (min): 57 min  + 45 min  Short Term Goals: Week 1:  PT Short Term Goal 1 (Week 1): Patient will complete bed<> wc transfers with LRAD and CGA consistently PT Short Term Goal 2 (Week 1): Patient will ambulate at least 154ft with LRAD and CGA consistently PT Short Term Goal 3 (Week 1): Patient will improve BBS >5 pts  Skilled Therapeutic Interventions/Progress Updates:      1st session: Pt greeted supine in bed at start of session, agreeable to PT tx. No reports of pain. Reports mild dizziness, no nystagmus observed with positional changes. Supine<>sit completed with supervision wtihout bed features. Sit's unsupported at EOB with supervision and no overt LOB. Completes stand<>pivot transfer with CGA from EOB to w/c, cues needed for hand placement and safety awareness. W/c transport for time management to main rehab gym and completed stand<>pivot with CGA to mat table. BP assessed reading 139/83. Pt requested morning medications - alerted RN who arrived shortly after to provide. Completed 1x10 sit<>stands with CGA from mat table - truncal ataxia with moderate sway. Gait training 63ft with modA and no AD + 26ft with minA and RW - without AD, demo's ataxic gait pattern with narrow BOS and mild scissoring, increasing dyscoordination with RLE > LLE. Gait deficits improve with RW. Instructed on alternating toe taps with and without UE support - needs minA with RW support and modA without - emphasis on RLE coordination and paced activity. Instructed on short sitting to quadruped on mat table - completes with minA for positioning. In quadruped, completed alternating reaching to targets for RUE weightbearing/proprioception as well as coordination training. Then worked on tall kneeling - needing minA for  balance in due to truncal ataxia and forward lean. Performed tall kneeling perturbations to facilitate core and glut strengthening. Stand<>pivot with CGA back to her w/c and returned to room where she requested to lay back in bed at end of session. Bed mobility completed with supervision. She remained supine in bed with needs in reach and bed alarm on.   2nd session: Pt greeted supine in bed at start of session. Agreeable to PT tx. NT present for routine vitals. BP within parameters (<160). Supine<>sit with supervision and donned regular socks with setupA while seated EOB. Donned slip-on shoes with modA while seated EOB. Stand<>pivot transfer with CGA to w/c with cues for safety awareness and hand placement, ataxia present. W/c transport for time management to main rehab gym and completed stand<>pivot with CGA to mat table. Instructed on cone stacking in standing with minA for balance on level ground, using RUE only for coordination training and truncal ataxia - cone stacking completed to varying height surfaces. NMR with blue air-ex foam pad, static standing for proprioception training requiring minA due to frequent LOB and truncal ataxia with forward bias. NMR with quadruped position, completed reciprocal "crawling" in quadruped on mat table. NMR with tall kneeling - completing PNF D1 flex/ext cone stacking in tall kneeling. NMR for posterior chain with sitting on heels > tall kneeling transitions, 1x10. Gait ~134ft requiring minA and RW, gait remains ataxic and difficulty isolating reciprocal movement patterns with narrow BOS and mild scissoring, increased effort for R foot placement. W/c transport back to her room and pt requesting to remain in w/c at end of session. Safety belt alarm activated and  needs within reach at end of session.  Therapy Documentation Precautions:  Precautions Precautions: Fall Precaution Comments: Diplopia, trunk/RUE/LE ataxic Restrictions Weight Bearing Restrictions:  No General:    Therapy/Group: Individual Therapy  Orrin Brigham 03/04/2021, 7:37 AM

## 2021-03-04 NOTE — Progress Notes (Signed)
PROGRESS NOTE   Subjective/Complaints: Patient seen sitting up, working with therapy this morning.  She she states she slept well overnight.  She has questions regarding imaging.  She was seen by neurology yesterday, notes reviewed-medications changed.  ROS: Denies CP, SOB, N/V/D  Objective:   CT ANGIO HEAD NECK W WO CM  Result Date: 03/03/2021 CLINICAL DATA:  Stroke follow-up. EXAM: CT ANGIOGRAPHY HEAD AND NECK TECHNIQUE: Multidetector CT imaging of the head and neck was performed using the standard protocol during bolus administration of intravenous contrast. Multiplanar CT image reconstructions and MIPs were obtained to evaluate the vascular anatomy. Carotid stenosis measurements (when applicable) are obtained utilizing NASCET criteria, using the distal internal carotid diameter as the denominator. CONTRAST:  57mL OMNIPAQUE IOHEXOL 350 MG/ML SOLN COMPARISON:  CT angiogram of the head and neck Feb 23, 2021. FINDINGS: CTA NECK FINDINGS Aortic arch: 4 vessel aortic arch with direct origin of the left vertebral artery from the aortic arch in. Imaged portion shows no evidence of aneurysm or dissection. No significant stenosis of the major arch vessel origins. Right carotid system: Minimal atherosclerotic changes of the right carotid bifurcation without hemodynamically significant stenosis. Left carotid system: Mild atherosclerotic changes of the left carotid bifurcation without hemodynamically significant stenosis. Vertebral arteries: Right dominant. The non dominant left vertebral artery has normal course and caliber from the aortic arch to the vertebrobasilar junction. Area of luminal irregularity is again seen in the V3 segment of the right vertebral artery now with mild stenosis followed by small (1-2 mm) outpouching suggesting pseudoaneurysm. There is also luminal irregularity at the V4 segment. Skeleton: Negative. Other neck: Negative. Upper  chest: Negative. Review of the MIP images confirms the above findings CTA HEAD FINDINGS Anterior circulation: No significant stenosis, proximal occlusion, aneurysm, or vascular malformation. Posterior circulation: Mild luminal irregularity of the V4 segment of the right vertebral artery which involves the origin of the right PICA which appear patent. The basilar artery is small in caliber without area of focal stenosis, likely related to the presence of bilateral prominent posterior communicating arteries. The right P1/PCA segment is absent (fetal PCA). Interval recanalization of the right superior cerebellar artery. Venous sinuses: As permitted by contrast timing, patent. Anatomic variants: Right fetal PCA. Prominent left posterior communicating artery. Review of the MIP images confirms the above findings IMPRESSION: 1. Luminal irregularity in the V3 and V4 segments of the right vertebral artery is suggestive of dissection without hemodynamically significant stenosis. Area of irregularity of the origin of the right PICA which remains patent. There is a new diminutive outpouching from the right V3 segment suggestive of a small pseudoaneurysm. 2. Interval recanalization of the right superior cerebellar artery. 3. No significant findings in the anterior circulation. Electronically Signed   By: Baldemar Lenis M.D.   On: 03/03/2021 12:54   CT HEAD WO CONTRAST  Result Date: 03/04/2021 CLINICAL DATA:  60 year old female with new right PICA infarct superimposed on recent right SCA infarct. EXAM: CT HEAD WITHOUT CONTRAST TECHNIQUE: Contiguous axial images were obtained from the base of the skull through the vertex without intravenous contrast. COMPARISON:  Brain MRI, CTA head and neck, CT head yesterday. FINDINGS: Brain: Supratentorial  gray-white matter differentiation remains stable. And the lateral and 3rd ventricle size and configuration is stable with temporal horns remaining diminutive on series 3,  image 9. Subacute right SCA infarct with petechial hemorrhage and more acute confluent right PICA infarct with cytotoxic edema redemonstrated. Stable posterior fossa mass effect including on the 4th ventricle. Superimposed chronic left SCA cerebellar infarct. Posterior fossa gray-white matter differentiation and basilar cisterns are stable since 0645 hours yesterday. Vascular: Mild Calcified atherosclerosis at the skull base. Skull: Stable.  No acute osseous abnormality identified. Sinuses/Orbits: Stable occasional sinus mucosal thickening and bubbly opacity. Tympanic cavities and mastoids remain clear. Other: Leftward gaze deviation as before. Visualized scalp soft tissues are within normal limits. IMPRESSION: 1. Stable since yesterday. Confluent Right PICA infarct superimposed on subacute Right SCA infarct with petechial hemorrhage. Stable posterior fossa mass effect. No lateral or 3rd ventriculomegaly at this time. 2. No new intracranial abnormality. Electronically Signed   By: Odessa Fleming M.D.   On: 03/04/2021 06:08   CT HEAD WO CONTRAST  Addendum Date: 03/03/2021   ADDENDUM REPORT: 03/03/2021 08:53 ADDENDUM: Study discussed by telephone with Dr. [name] on 03/03/2021 at 0847 hours. Electronically Signed   By: Odessa Fleming M.D.   On: 03/03/2021 08:53   Result Date: 03/03/2021 CLINICAL DATA:  60 year old female with right SCA cerebellar infarct on MRI 02/23/2021. Subsequent encounter. EXAM: CT HEAD WITHOUT CONTRAST TECHNIQUE: Contiguous axial images were obtained from the base of the skull through the vertex without intravenous contrast. COMPARISON:  Head CT 03/01/2021 and earlier. FINDINGS: Brain: Right superior cerebellar artery territory and cerebellar vermis cytotoxic edema with petechial hemorrhage. But new confluent cytotoxic edema now in the right PICA territory (series 3, image 5). Subsequent increased posterior fossa mass effect, with decreased prepontine and pre medullary cisterns. No tonsillar  herniation. Increased effacement of the 4th ventricle, but lateral and 3rd ventricle size not significantly changed at this time. Supratentorial gray and white matter signal is stable. No new intracranial hemorrhage. Vascular: Mild Calcified atherosclerosis at the skull base. No suspicious intracranial vascular hyperdensity. Skull: No acute osseous abnormality identified. Small benign right frontal bone exostosis on series 4, image 59. Sinuses/Orbits: Stable mucosal thickening and bubbly opacity in the left sphenoid, left maxillary, and posterior right ethmoid sinuses. Tympanic cavities and mastoids remain clear. Other: Leftward gaze deviation. Visualized scalp soft tissues are within normal limits. IMPRESSION: 1. Confluent acute right PICA infarct is new since 03/01/2021, superimposed on the recent right SCA infarct. Subsequent increased cytotoxic edema in the right cerebellum with increased posterior fossa mass effect including effaced 4th ventricle. But no malignant hemorrhagic transformation or ventriculomegaly at this time. 2. Supratentorial brain appears stable, negative. Electronically Signed: By: Odessa Fleming M.D. On: 03/03/2021 08:42   MR BRAIN WO CONTRAST  Result Date: 03/03/2021 CLINICAL DATA:  Stroke follow-up EXAM: MRI HEAD WITHOUT CONTRAST TECHNIQUE: Multiplanar, multiecho pulse sequences of the brain and surrounding structures were obtained without intravenous contrast. COMPARISON:  Head CT Mar 03, 2021. FINDINGS: Brain: Large area of restricted diffusion involving the inferior right cerebellar hemisphere consistent with acute/subacute right PICA territory infarct. Patchy T1 hyperintensity with prominent susceptibility artifact in the superior aspect of the right cerebellar hemisphere with surrounding vasogenic edema is suggestive of hemorrhagic transformation of non prior right SCA territory infarct. Edema and mass effect from these 2 areas of infarcts resulting in leftward midline shift with mass  effect on the fourth ventricle which remains patent as well as mass effect on the posterior aspect of  the pons and mid brain with mild periaqueductal edema and small ascending transtentorial herniation. No tonsillar herniation. No hydrocephalus. Overall, findings are similar to prior CT performed earlier today Area of encephalomalacia and gliosis with hemosiderin staining in the superior left cerebellar hemisphere consistent with chronic left SCA territory infarct. Vascular: Normal flow voids. Skull and upper cervical spine: Normal marrow signal. Sinuses/Orbits: Mucosal thickening with bubbly secretion in the left maxillary and sphenoid sinuses. The orbits are maintained. Other: Right mastoid effusion. IMPRESSION: Large acute/subacute right PICA territory infarct and late subacute right SCA territory infarct with superimposed hemorrhagic transformation. Edema from of right cerebellar hemisphere infarcts results in leftward midline shift with mass effect on the fourth ventricle, mid brain and pons without evidence of hydrocephalus. Findings appear stable compared to head CT performed earlier today. Electronically Signed   By: Baldemar Lenis M.D.   On: 03/03/2021 13:13   No results for input(s): WBC, HGB, HCT, PLT in the last 72 hours. No results for input(s): NA, K, CL, CO2, GLUCOSE, BUN, CREATININE, CALCIUM in the last 72 hours.  Intake/Output Summary (Last 24 hours) at 03/04/2021 0916 Last data filed at 03/04/2021 0808 Gross per 24 hour  Intake 580 ml  Output --  Net 580 ml        Physical Exam: Vital Signs Blood pressure 135/89, pulse 95, temperature 98.7 F (37.1 C), temperature source Oral, resp. rate 20, height 5\' 2"  (1.575 m), weight 60.2 kg, last menstrual period 12/08/2010, SpO2 100 %.  Constitutional: No distress . Vital signs reviewed. HENT: Normocephalic.  Atraumatic. Eyes: EOMI. No discharge. Cardiovascular: No JVD.  RRR. Respiratory: Normal effort.  No stridor.   Bilateral clear to auscultation. GI: Non-distended.  BS +. Skin: Warm and dry.  Intact. Psych: Normal mood.  Normal behavior. Musc: No edema in extremities.  No tenderness in extremities. Neuro: Alert Motor: 5/5 throughout RUE dysmetria and ataxia Dysarthria  Assessment/Plan: 1. Functional deficits which require 3+ hours per day of interdisciplinary therapy in a comprehensive inpatient rehab setting.  Physiatrist is providing close team supervision and 24 hour management of active medical problems listed below.  Physiatrist and rehab team continue to assess barriers to discharge/monitor patient progress toward functional and medical goals  Care Tool:  Bathing    Body parts bathed by patient: Right arm,Right lower leg,Left arm,Chest,Abdomen,Front perineal area,Buttocks,Left upper leg,Left lower leg,Face,Right upper leg         Bathing assist Assist Level: Supervision/Verbal cueing     Upper Body Dressing/Undressing Upper body dressing   What is the patient wearing?: Pull over shirt,Bra    Upper body assist Assist Level: Set up assist    Lower Body Dressing/Undressing Lower body dressing      What is the patient wearing?: Underwear/pull up,Pants     Lower body assist Assist for lower body dressing: Supervision/Verbal cueing     Toileting Toileting    Toileting assist Assist for toileting: Supervision/Verbal cueing     Transfers Chair/bed transfer  Transfers assist     Chair/bed transfer assist level: Minimal Assistance - Patient > 75%     Locomotion Ambulation   Ambulation assist      Assist level: Minimal Assistance - Patient > 75% Assistive device: Walker-platform Max distance: 46ft   Walk 10 feet activity   Assist     Assist level: Minimal Assistance - Patient > 75% Assistive device: Walker-rolling   Walk 50 feet activity   Assist    Assist level: Minimal Assistance - Patient >  75% Assistive device: Walker-rolling    Walk 150  feet activity   Assist    Assist level: Minimal Assistance - Patient > 75% Assistive device: Walker-rolling    Walk 10 feet on uneven surface  activity   Assist Walk 10 feet on uneven surfaces activity did not occur: Safety/medical concerns         Wheelchair     Assist Will patient use wheelchair at discharge?: No             Wheelchair 50 feet with 2 turns activity    Assist            Wheelchair 150 feet activity     Assist           Medical Problem List and Plan: 1.  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 Increased edema associated with the right cerebellar and right dorsal midbrain infarcts with interval development of acute hemorrhage at the site of right cerebellar infarct. Increased resulting mass effect on the fourth ventricle without hydrocephalus.      2. Paranasal sinus mucosal thickening with frothy secretions.  Repeat CT on 5/17 showing new right PICA infarct, repeat on 5/19 showing stability  Repeat CTA on 5/18 showing likely dissection  MRI on 5/18 showing large right PICA infarct and subacute right MCA infarct with superimposed hemorrhagic transformation.  Leftward midline shift with mass-effect on fourth ventricle, but no hydrocephalus  Appreciate Neuro recs, discussed with Neuro - following closely  Discussed significant abnormalities with therapies and resulting functional prognosis.  Therapy notes reviewed, requiring min assist for most tasks with PT.  Continue meropenem CIR 2.  Antithrombotics: -DVT/anticoagulation: Lovenox             -antiplatelet therapy: Aspirin 81 mg daily, Brilinta d/ced 3. Pain Management: Tylenol as needed             Monitor, particularly for headaches with increased  exertion  Controlled on 5/19 4. Mood: Provide emotional support             -antipsychotic agents: N/A 5. Neuropsych: This patient is capable of making decisions on her own behalf. 6. Skin/Wound Care: Routine skin checks 7. Fluids/Electrolytes/Nutrition: Routine in and outs 8.  Hypertension.  Patient on Cozaar50 mg daily, Norvasc 5 mg daily prior to admission.    Norvasc 5 started on 5/16             Vitals with BMI 03/04/2021 03/03/2021 03/03/2021  Height - - -  Weight - - -  BMI - - -  Systolic 135 138 [id number]  Diastolic 89 76 77  Pulse 95 76 93   Relatively controlled on 5/19 9. Hyperlipidemia: Lipitor 10. ABLA             Hb 11.1 on 5/16, labs ordered for tomorrow  Cont to monitor 12. Transaminitis  LFTs elevated on 5/16, labs ordered for tomorrow  Cont to monitor 13. Dysarthria  SLP ordered  LOS: 6 days A FACE TO FACE EVALUATION WAS PERFORMED  Dina Warbington Karis Jubanil Trenika Hudson 03/04/2021, 9:16 AM

## 2021-03-04 NOTE — Progress Notes (Signed)
Speech Language Pathology Daily Session Note  Patient Details  Name: Judy Gutierrez MRN: [id number] Date of Birth: September 27, 1961  Today's Date: 03/04/2021 SLP Individual Time: 6979-4801 SLP Individual Time Calculation (min): 27 min  Short Term Goals: Week 1: SLP Short Term Goal 1 (Week 1): Pt will understand, recall and utilize compensatory strategies for dysarthria with 90% accuracy Supervision A SLP Short Term Goal 2 (Week 1): Pt will complete medication management task with 100% accuracy Supervision A SLP Short Term Goal 3 (Week 1): Pt will complete complex problem solving task for home environment with 90% accuracy Supervision A  Skilled Therapeutic Interventions: Pt seen for skilled ST with focus on speech and cognition goals. Pt presents with decreased intelligibility today as well as some cognitive deficits not present previous date. CT scan this AM with MD monitoring imaging. Pt requiring re-education for compensatory strategies for dysarthria utilizing posted visual aid. Pt able to recall at 1 and 5 minutes, 0% recall after 20 minutes. Pt participating in portions of the ALFA "Counting Money" - 50% accuracy, "Solving Daily Math Problems" - 30% accuracy. Pt with diminished error awareness during all tasks, often answered impulsively resulting in incorrect answers. When patient cued to answer with extra time, accuracy increased. Pt transferred to bed with min A cues for safety, alarm set and all needs within reach. Cont ST POC.  Pain Pain Assessment Pain Scale: 0-10 Pain Score: 0-No pain  Therapy/Group: Individual Therapy  Tacey Ruiz 03/04/2021, 3:42 PM

## 2021-03-04 NOTE — Progress Notes (Signed)
Occupational Therapy Session Note  Patient Details  Name: Judy Gutierrez MRN: [id number] Date of Birth: [birth year]  Today's Date: 03/04/2021 OT Individual Time: 0930-1015 OT Individual Time Calculation (min): 45 min    Short Term Goals: Week 1:  OT Short Term Goal 1 (Week 1): Pt will complete toileting tasks at CGA level OT Short Term Goal 2 (Week 1): Pt will don pants with CGA OT Short Term Goal 3 (Week 1): Pt will utilize appropriate visual compensation strategies during ADL routine with min cueing OT Short Term Goal 4 (Week 1): Pt will transfer to walk in shower with min A  Skilled Therapeutic Interventions/Progress Updates:      Pt seen for BADL retraining of toileting, bathing, and dressing with a focus on balance and postural control. Overall pt needed CGA and cues to take her time during transfers to ensure her BOS was adequate.  Pt completed shower and dressing and then engaged in seated chair yoga. Worked on arm poses in warrior 1 with forward and back shifts of her trunk followed by isometric holds in B directions.  Trunk extension with mountain pose and BUE coordination with bringing hands into [facility].   Pt really enjoyed this as she practiced yoga prior to admission.  TR arrived for her next session.   Therapy Documentation Precautions:  Precautions Precautions: Fall Precaution Comments: Diplopia, trunk/RUE/LE ataxic Restrictions Weight Bearing Restrictions: No   Pain: Pain Assessment Pain Score: 0-No pain ADL: ADL Eating: Supervision/safety Where Assessed-Eating: Edge of bed Grooming: Setup Where Assessed-Grooming: Standing at sink Upper Body Bathing: Supervision/safety Where Assessed-Upper Body Bathing: Shower Lower Body Bathing: Supervision/safety Where Assessed-Lower Body Bathing: Shower Upper Body Dressing: Setup Where Assessed-Upper Body Dressing: Edge of bed Lower Body Dressing: Supervision/safety Where Assessed-Lower Body Dressing: Edge of  bed Toileting: Supervision/safety Where Assessed-Toileting: Teacher, adult education: Furniture conservator/restorer Method: Proofreader: Acupuncturist: Administrator, arts Method: Designer, industrial/product: Emergency planning/management officer   Therapy/Group: Individual Therapy  Lundon Verdejo 03/04/2021, 12:52 PM

## 2021-03-05 DIAGNOSIS — I7774 Dissection of vertebral artery: Secondary | ICD-10-CM

## 2021-03-05 DIAGNOSIS — E871 Hypo-osmolality and hyponatremia: Secondary | ICD-10-CM

## 2021-03-05 LAB — COMPREHENSIVE METABOLIC PANEL
ALT: 40 U/L (ref 0–44)
AST: 45 U/L — ABNORMAL HIGH (ref 15–41)
Albumin: 3.4 g/dL — ABNORMAL LOW (ref 3.5–5.0)
Alkaline Phosphatase: 72 U/L (ref 38–126)
Anion gap: 5 (ref 5–15)
BUN: 10 mg/dL (ref 6–20)
CO2: 25 mmol/L (ref 22–32)
Calcium: 8.8 mg/dL — ABNORMAL LOW (ref 8.9–10.3)
Chloride: 104 mmol/L (ref 98–111)
Creatinine, Ser: 0.63 mg/dL (ref 0.44–1.00)
GFR, Estimated: >60 mL/min (ref >60)
Glucose, Bld: 116 mg/dL — ABNORMAL HIGH (ref 70–99)
Potassium: 3.9 mmol/L (ref 3.5–5.1)
Sodium: 134 mmol/L — ABNORMAL LOW (ref 135–145)
Total Bilirubin: 0.8 mg/dL (ref 0.3–1.2)
Total Protein: 7.5 g/dL (ref 6.5–8.1)

## 2021-03-05 NOTE — Progress Notes (Signed)
STROKE TEAM PROGRESS NOTE   SUBJECTIVE (INTERVAL HISTORY) Husband is at bedside.  Patient neurologically unchanged, still has mild diplopia and right arm discoordination.  We will repeat CT in a.m.  Discussed with husband that Dr. [name] will review CT and discussed with them in a.m.  OBJECTIVE Temp:  [98.2 F (36.8 C)-98.3 F (36.8 C)] 98.3 F (36.8 C) (05/20 1452) Pulse Rate:  [80-105] 105 (05/20 1452) Resp:  [16-20] 16 (05/20 1452) BP: (108-144)/(78-86) 108/82 (05/20 1452) SpO2:  [98 %-99 %] 98 % (05/20 1452)  No results for input(s): GLUCAP in the last 168 hours. Recent Labs  Lab 03/01/21 0500 03/05/21 0615  NA 136 134*  K 4.0 3.9  CL 105 104  CO2 26 25  GLUCOSE 106* 116*  BUN 16 10  CREATININE 0.62 0.63  CALCIUM 8.7* 8.8*   Recent Labs  Lab 03/01/21 0500 03/05/21 0615  AST 56* 45*  ALT 47* 40  ALKPHOS 59 72  BILITOT 1.2 0.8  PROT 6.8 7.5  ALBUMIN 3.0* 3.4*   Recent Labs  Lab 03/01/21 0500  WBC 5.1  NEUTROABS 2.5  HGB 11.1*  HCT 33.9*  MCV 89.0  PLT 214   No results for input(s): CKTOTAL, CKMB, CKMBINDEX, TROPONINI in the last 168 hours. No results for input(s): LABPROT, INR in the last 72 hours. No results for input(s): COLORURINE, LABSPEC, PHURINE, GLUCOSEU, HGBUR, BILIRUBINUR, KETONESUR, PROTEINUR, UROBILINOGEN, NITRITE, LEUKOCYTESUR in the last 72 hours.  Invalid input(s): APPERANCEUR     Component Value Date/Time   CHOL 105 02/23/2021 0745   CHOL 191 09/09/2015 0804   TRIG 28 02/23/2021 0745   HDL 48 02/23/2021 0745   HDL 60 09/09/2015 0804   CHOLHDL 2.2 02/23/2021 0745   VLDL 6 02/23/2021 0745   LDLCALC 51 02/23/2021 0745   LDLCALC 120 (H) 09/09/2015 0804   Lab Results  Component Value Date   HGBA1C 6.0 (H) 02/23/2021      Component Value Date/Time   LABOPIA NONE DETECTED 02/23/2021 0033   COCAINSCRNUR NONE DETECTED 02/23/2021 0033   LABBENZ NONE DETECTED 02/23/2021 0033   AMPHETMU NONE DETECTED 02/23/2021 0033   THCU NONE DETECTED  02/23/2021 0033   LABBARB NONE DETECTED 02/23/2021 0033    No results for input(s): ETH in the last 168 hours.  I have personally reviewed the radiological images below and agree with the radiology interpretations.  CT ANGIO HEAD NECK W WO CM  Result Date: 03/03/2021 CLINICAL DATA:  Stroke follow-up. EXAM: CT ANGIOGRAPHY HEAD AND NECK TECHNIQUE: Multidetector CT imaging of the head and neck was performed using the standard protocol during bolus administration of intravenous contrast. Multiplanar CT image reconstructions and MIPs were obtained to evaluate the vascular anatomy. Carotid stenosis measurements (when applicable) are obtained utilizing NASCET criteria, using the distal internal carotid diameter as the denominator. CONTRAST:  50mL OMNIPAQUE IOHEXOL 350 MG/ML SOLN COMPARISON:  CT angiogram of the head and neck Feb 23, 2021. FINDINGS: CTA NECK FINDINGS Aortic arch: 4 vessel aortic arch with direct origin of the left vertebral artery from the aortic arch in. Imaged portion shows no evidence of aneurysm or dissection. No significant stenosis of the major arch vessel origins. Right carotid system: Minimal atherosclerotic changes of the right carotid bifurcation without hemodynamically significant stenosis. Left carotid system: Mild atherosclerotic changes of the left carotid bifurcation without hemodynamically significant stenosis. Vertebral arteries: Right dominant. The non dominant left vertebral artery has normal course and caliber from the aortic arch to the vertebrobasilar junction. Area of  luminal irregularity is again seen in the V3 segment of the right vertebral artery now with mild stenosis followed by small (1-2 mm) outpouching suggesting pseudoaneurysm. There is also luminal irregularity at the V4 segment. Skeleton: Negative. Other neck: Negative. Upper chest: Negative. Review of the MIP images confirms the above findings CTA HEAD FINDINGS Anterior circulation: No significant stenosis,  proximal occlusion, aneurysm, or vascular malformation. Posterior circulation: Mild luminal irregularity of the V4 segment of the right vertebral artery which involves the origin of the right PICA which appear patent. The basilar artery is small in caliber without area of focal stenosis, likely related to the presence of bilateral prominent posterior communicating arteries. The right P1/PCA segment is absent (fetal PCA). Interval recanalization of the right superior cerebellar artery. Venous sinuses: As permitted by contrast timing, patent. Anatomic variants: Right fetal PCA. Prominent left posterior communicating artery. Review of the MIP images confirms the above findings IMPRESSION: 1. Luminal irregularity in the V3 and V4 segments of the right vertebral artery is suggestive of dissection without hemodynamically significant stenosis. Area of irregularity of the origin of the right PICA which remains patent. There is a new diminutive outpouching from the right V3 segment suggestive of a small pseudoaneurysm. 2. Interval recanalization of the right superior cerebellar artery. 3. No significant findings in the anterior circulation. Electronically Signed   By: Baldemar Lenis M.D.   On: 03/03/2021 12:54   CT HEAD WO CONTRAST  Result Date: 03/04/2021 CLINICAL DATA:  60 year old female with new right PICA infarct superimposed on recent right SCA infarct. EXAM: CT HEAD WITHOUT CONTRAST TECHNIQUE: Contiguous axial images were obtained from the base of the skull through the vertex without intravenous contrast. COMPARISON:  Brain MRI, CTA head and neck, CT head yesterday. FINDINGS: Brain: Supratentorial gray-white matter differentiation remains stable. And the lateral and 3rd ventricle size and configuration is stable with temporal horns remaining diminutive on series 3, image 9. Subacute right SCA infarct with petechial hemorrhage and more acute confluent right PICA infarct with cytotoxic edema  redemonstrated. Stable posterior fossa mass effect including on the 4th ventricle. Superimposed chronic left SCA cerebellar infarct. Posterior fossa gray-white matter differentiation and basilar cisterns are stable since 0645 hours yesterday. Vascular: Mild Calcified atherosclerosis at the skull base. Skull: Stable.  No acute osseous abnormality identified. Sinuses/Orbits: Stable occasional sinus mucosal thickening and bubbly opacity. Tympanic cavities and mastoids remain clear. Other: Leftward gaze deviation as before. Visualized scalp soft tissues are within normal limits. IMPRESSION: 1. Stable since yesterday. Confluent Right PICA infarct superimposed on subacute Right SCA infarct with petechial hemorrhage. Stable posterior fossa mass effect. No lateral or 3rd ventriculomegaly at this time. 2. No new intracranial abnormality. Electronically Signed   By: Odessa Fleming M.D.   On: 03/04/2021 06:08   CT HEAD WO CONTRAST  Addendum Date: 03/03/2021   ADDENDUM REPORT: 03/03/2021 08:53 ADDENDUM: Study discussed by telephone with Dr. [name] on 03/03/2021 at 0847 hours. Electronically Signed   By: Odessa Fleming M.D.   On: 03/03/2021 08:53   Result Date: 03/03/2021 CLINICAL DATA:  60 year old female with right SCA cerebellar infarct on MRI 02/23/2021. Subsequent encounter. EXAM: CT HEAD WITHOUT CONTRAST TECHNIQUE: Contiguous axial images were obtained from the base of the skull through the vertex without intravenous contrast. COMPARISON:  Head CT 03/01/2021 and earlier. FINDINGS: Brain: Right superior cerebellar artery territory and cerebellar vermis cytotoxic edema with petechial hemorrhage. But new confluent cytotoxic edema now in the right PICA territory (series 3, image 5).  Subsequent increased posterior fossa mass effect, with decreased prepontine and pre medullary cisterns. No tonsillar herniation. Increased effacement of the 4th ventricle, but lateral and 3rd ventricle size not significantly changed at this time.  Supratentorial gray and white matter signal is stable. No new intracranial hemorrhage. Vascular: Mild Calcified atherosclerosis at the skull base. No suspicious intracranial vascular hyperdensity. Skull: No acute osseous abnormality identified. Small benign right frontal bone exostosis on series 4, image 59. Sinuses/Orbits: Stable mucosal thickening and bubbly opacity in the left sphenoid, left maxillary, and posterior right ethmoid sinuses. Tympanic cavities and mastoids remain clear. Other: Leftward gaze deviation. Visualized scalp soft tissues are within normal limits. IMPRESSION: 1. Confluent acute right PICA infarct is new since 03/01/2021, superimposed on the recent right SCA infarct. Subsequent increased cytotoxic edema in the right cerebellum with increased posterior fossa mass effect including effaced 4th ventricle. But no malignant hemorrhagic transformation or ventriculomegaly at this time. 2. Supratentorial brain appears stable, negative. Electronically Signed: By: Odessa FlemingH  Hall M.D. On: 03/03/2021 08:42   CT HEAD WO CONTRAST  Result Date: 03/01/2021 CLINICAL DATA:  Stroke follow-up. EXAM: CT HEAD WITHOUT CONTRAST TECHNIQUE: Contiguous axial images were obtained from the base of the skull through the vertex without intravenous contrast. COMPARISON:  CT head and MRI Feb 23, 2021. FINDINGS: Brain: Increased edema associated with the right cerebellar and right dorsal midbrain infarct. Increased resulting mass effect on the fourth ventricle without hydrocephalus. Interval development of acute hemorrhage at the site of right cerebellar infarct. No evidence of new/interval acute large vascular territory infarct. Remote left cerebellar infarct. Basal cisterns are patent. No inferior cerebellar tonsillar herniation or midline shift. No mass lesion. Vascular: No hyperdense vessel identified. Skull: No acute fracture. Sinuses/Orbits: Mucosal thickening the left maxillary sinus, right posterior ethmoid air cell and  left sphenoid sinus with frothy secretions in the sinuses. Unremarkable orbits. Other: No mastoid effusions. IMPRESSION: 1. Increased edema associated with the right cerebellar and right dorsal midbrain infarcts with interval development of acute hemorrhage at the site of right cerebellar infarct. Increased resulting mass effect on the fourth ventricle without hydrocephalus. 2. Paranasal sinus mucosal thickening with frothy secretions, as detailed above. Electronically Signed   By: Feliberto HartsFrederick S Jones MD   On: 03/01/2021 14:39   MR BRAIN WO CONTRAST  Result Date: 03/03/2021 CLINICAL DATA:  Stroke follow-up EXAM: MRI HEAD WITHOUT CONTRAST TECHNIQUE: Multiplanar, multiecho pulse sequences of the brain and surrounding structures were obtained without intravenous contrast. COMPARISON:  Head CT Mar 03, 2021. FINDINGS: Brain: Large area of restricted diffusion involving the inferior right cerebellar hemisphere consistent with acute/subacute right PICA territory infarct. Patchy T1 hyperintensity with prominent susceptibility artifact in the superior aspect of the right cerebellar hemisphere with surrounding vasogenic edema is suggestive of hemorrhagic transformation of non prior right SCA territory infarct. Edema and mass effect from these 2 areas of infarcts resulting in leftward midline shift with mass effect on the fourth ventricle which remains patent as well as mass effect on the posterior aspect of the pons and mid brain with mild periaqueductal edema and small ascending transtentorial herniation. No tonsillar herniation. No hydrocephalus. Overall, findings are similar to prior CT performed earlier today Area of encephalomalacia and gliosis with hemosiderin staining in the superior left cerebellar hemisphere consistent with chronic left SCA territory infarct. Vascular: Normal flow voids. Skull and upper cervical spine: Normal marrow signal. Sinuses/Orbits: Mucosal thickening with bubbly secretion in the left  maxillary and sphenoid sinuses. The orbits are maintained. Other: Right mastoid effusion.  IMPRESSION: Large acute/subacute right PICA territory infarct and late subacute right SCA territory infarct with superimposed hemorrhagic transformation. Edema from of right cerebellar hemisphere infarcts results in leftward midline shift with mass effect on the fourth ventricle, mid brain and pons without evidence of hydrocephalus. Findings appear stable compared to head CT performed earlier today. Electronically Signed   By: Baldemar Lenis M.D.   On: 03/03/2021 13:13   MR BRAIN WO CONTRAST  Result Date: 02/23/2021 CLINICAL DATA:  Dizziness, nausea and vomiting. EXAM: MRI HEAD WITHOUT CONTRAST TECHNIQUE: Multiplanar, multiecho pulse sequences of the brain and surrounding structures were obtained without intravenous contrast. COMPARISON:  CT studies same day FINDINGS: Brain: Diffusion imaging shows a 4-5 cm region of acute infarction within the superior cerebellum on the right. Acute infarction also within the dorsal right midbrain. Old infarction within the left superior cerebellum. Mild swelling but no acute hemorrhage in the right cerebellum. Old hemosiderin residual within the left cerebellar stroke. Cerebral hemispheres are normal. No mass, hydrocephalus or extra-axial collection. Vascular: No acute vascular finding by standard MRI. Skull and upper cervical spine: Negative Sinuses/Orbits: Mucosal inflammatory changes of the left maxillary and sphenoid sinuses. Orbits negative. Other: None IMPRESSION: 4-5 cm region of acute infarction within the superior cerebellum on the right. Punctate acute infarction in the right dorsal midbrain. Mild swelling. No evidence of acute hemorrhage. Old infarction in the left superior cerebellum with atrophy, encephalomalacia and gliosis and hemosiderin deposition. Electronically Signed   By: Paulina Fusi M.D.   On: 02/23/2021 08:42   CUP PACEART REMOTE DEVICE  CHECK  Result Date: 02/09/2021 ILR summary report received. Battery status OK. Normal device function. No new symptom, tachy, brady, or pause episodes. No new AF episodes. Monthly summary reports and ROV/PRN.  RP  CT HEAD CODE STROKE WO CONTRAST  Result Date: 02/23/2021 CLINICAL DATA:  Code stroke. Initial evaluation for neuro deficit, stroke suspected. EXAM: CT HEAD WITHOUT CONTRAST TECHNIQUE: Contiguous axial images were obtained from the base of the skull through the vertex without intravenous contrast. COMPARISON:  Prior CT from 01/31/2020. FINDINGS: Brain: Streak/beam hardening artifact emanating from the left occipital calvarium noted, mildly limiting assessment. Cerebral volume within normal limits. Focal encephalomalacia at the central and left superior cerebellum, consistent with a chronic left MCA distribution infarct. Subtle evolving hypodensity noted involving the superior right cerebellum and cerebellar vermis, concerning for an acute right SCA distribution infarct (series 3, image 12, 11). No associated hemorrhage or mass effect. No other evidence for acute large vessel territory infarct. No intracranial hemorrhage. No mass lesion, mass effect or midline shift. No hydrocephalus or extra-axial fluid collection. Vascular: No hyperdense vessel. Skull: Scalp soft tissues demonstrate no acute finding. Small focal exostosis noted at the right frontal calvarium. Calvarium intact. Sinuses/Orbits: Globes and orbital soft tissues within normal limits. Moderate left sphenoid and maxillary sinusitis. Additional scattered pneumatized secretions noted within the ethmoidal air cells. Subcentimeter osteoma noted within the right ethmoidal air cells as well. Mastoid air cells are clear. Other: None. ASPECTS [facility] Stroke Program Early CT Score) - Ganglionic level infarction (caudate, lentiform nuclei, internal capsule, insula, M1-M3 cortex): 7 - Supraganglionic infarction (M4-M6 cortex): 3 Total score (0-10  with 10 being normal): 10 IMPRESSION: 1. Evolving hypodensity involving the superior right cerebellum/cerebellar vermis, concerning for an acute right SCA distribution infarct. No intracranial hemorrhage or mass effect. 2. ASPECTS is 10. 3. Underlying chronic left SCA distribution infarcts. Critical Value/emergent results were called by telephone at the time of interpretation on  02/23/2021 at 12:55 a.m. to provider Dr. Amada Jupiter, Who verbally acknowledged these results. Electronically Signed   By: Rise Mu M.D.   On: 02/23/2021 01:23   CT ANGIO HEAD NECK W WO CM (CODE STROKE)  Result Date: 02/23/2021 CLINICAL DATA:  Initial evaluation for neuro deficit, stroke suspected. EXAM: CT ANGIOGRAPHY HEAD AND NECK TECHNIQUE: Multidetector CT imaging of the head and neck was performed using the standard protocol during bolus administration of intravenous contrast. Multiplanar CT image reconstructions and MIPs were obtained to evaluate the vascular anatomy. Carotid stenosis measurements (when applicable) are obtained utilizing NASCET criteria, using the distal internal carotid diameter as the denominator. CONTRAST:  11mL OMNIPAQUE IOHEXOL 350 MG/ML SOLN COMPARISON:  Prior head CT from earlier the same day. FINDINGS: CTA NECK FINDINGS Aortic arch: Visualized aortic arch normal in caliber with normal branch pattern. No stenosis about the origin of the great vessels. Right carotid system: Right common carotid artery patent from its origin to the bifurcation without stenosis. Minimal plaque about the right carotid bulb without stenosis. Right ICA patent distally without stenosis, dissection or occlusion. Left carotid system: Left CCA patent from its origin to the bifurcation without stenosis. Minimal plaque about the left carotid bulb without significant stenosis. Left ICA patent distally without stenosis, dissection or occlusion. Vertebral arteries: Left vertebral artery arises directly from the aortic arch.  Right vertebral artery dominant. Vertebral arteries widely patent within the neck. Just prior to the skull base/dural reflection, there is distal right V3 segment (series 9, image 83), suspicious for a possible short-segment dissection. Small amount of probable intraluminal thrombus protrudes into the vascular lumen at this level. No frank raised dissection flap is seen. No more than mild stenosis at this level. The right vertebral artery is relatively normal just distally as it crosses into the cranial vault. Skeleton: No acute osseous finding. No discrete or worrisome osseous lesions. Mild cervical spondylosis noted at C5-6 without significant stenosis. Other neck: No other acute soft tissue abnormality within the neck. No mass or adenopathy. Upper chest: Visualized upper chest demonstrates no acute finding. Review of the MIP images confirms the above findings CTA HEAD FINDINGS Anterior circulation: Petrous, cavernous, and supraclinoid segments patent without stenosis or other abnormality. A1 segments widely patent. Normal anterior communicating artery complex. Anterior cerebral arteries patent to their distal aspects without stenosis. No M1 stenosis or occlusion. Normal MCA bifurcations. Distal MCA branches well perfused and symmetric. Posterior circulation: Both V4 segments patent to the vertebrobasilar junction without stenosis. Both PICA origins patent and normal. Basilar somewhat diminutive but patent to its distal aspect without stenosis. Left superior cerebral artery patent. There is occlusion of the mid-distal right superior cerebellar artery, likely embolic (series 8, image 105). Left PCA supplied via the basilar as well as a prominent left posterior communicating artery. Predominant fetal type origin of the right PCA. Both PCAs remain widely patent to their distal aspects. Venous sinuses: Grossly patent allowing for timing the contrast bolus. Anatomic variants: Predominant fetal type origin of the right  PCA. Review of the MIP images confirms the above findings IMPRESSION: 1. Focal intimal irregularity involving the distal right V3 segment, likely reflecting a short-segment dissection. No frank raised dissection flap or significant luminal narrowing. 2. 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 3. Mild for age atheromatous disease elsewhere about the major arterial vasculature of the head and neck. No other hemodynamically significant or correctable stenosis. These results were communicated to Dr. Amada Jupiter at 1:22 amon 5/10/2022by text  page via the [facility] messaging system. Electronically Signed   By: Rise Mu M.D.   On: 02/23/2021 01:38    PHYSICAL EXAM  Temp:  [98.2 F (36.8 C)-98.3 F (36.8 C)] 98.3 F (36.8 C) (05/20 1452) Pulse Rate:  [80-105] 105 (05/20 1452) Resp:  [16-20] 16 (05/20 1452) BP: (108-144)/(78-86) 108/82 (05/20 1452) SpO2:  [98 %-99 %] 98 % (05/20 1452)  General - well nourished, well developed, in no apparent distress.    Ophthalmologic - fundi not visualized due to noncooperation.    Cardiovascular - regular rhythm and rate  Mental Status -  Level of arousal and orientation to time, place, and person were intact. Language including expression, naming, repetition, comprehension, reading, and writing was assessed and found intact Fund of Knowledge was assessed and was intact.  Cranial Nerves II - XII - II - Vision intact OU. III, IV, VI - Extraocular movements intact, however, subjective diplopia bilateral gaze V - Facial sensation intact bilaterally. VII - Facial movement intact bilaterally. VIII - mild bidirectional nystagmus on bilateral gaze, L>R X - Palate elevates symmetrically. XI - Chin turning & shoulder shrug intact bilaterally. XII - Tongue protrusion intact.  Motor Strength - The patient's strength was normal in all extremities and pronator drift was absent.   Motor Tone & Bulk -  Muscle tone was assessed at the neck and appendages and was normal.  Bulk was normal and fasciculations were absent.   Reflexes - The patient's reflexes were normal in all extremities and she had no pathological reflexes.  Sensory - Light touch, temperature/pinprick were assessed and were normal.    Coordination - The patient had normal movements in the left hand and feet with no ataxia or dysmetria.  However right finger-to-nose dysmetria.  Tremor was absent.  Gait and Station - deferred   ASSESSMENT/PLAN Ms. Shalika Arntz is a 60 y.o. female with history of recurrent stroke, hypertension admitted on 02/26/2021 for rehab due to right cerebellar infarct. She has been working with therapist pretty well resolved vertigo or N/V. however, developed acute onset vertigo and N/V on 03/01/21. Stroke team was called back for evaluation.   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    MRI confirmed right superior cerebellum moderate infarct and punctate dorsal midbrain infarct.    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 Edema from of right cerebellar hemisphere infarcts results in leftward midline shift with mass effect on the fourth ventricle, mid brain and pons without evidence of hydrocephalus.  CT head stable right PICA and SCA  infarcts, no hydrocephalus.  Loop recorder interrogation negative.    LDL 51  A1c 6.0.   echo pending.    Hypercoagulable work-up repeated again negative.    Was on aspirin 81 and the Brilinta 90 twice daily for 4 weeks and then Brilinta alone. Given hemorrhagic conversion, brilinta discontinued, continued on ASA 81. With new infarct  and possible dissection, added plavix 5/19 after CT stable.   Ongoing aggressive stroke risk factor management  Therapy recommendations:  CIR  Disposition:  Pending   Hx of stroke  left cerebellar stroke in 01/2020 status post tPA admitted to [facility].  At that time, CTA head and neck no LVO or stenosis, MRI showed left superior cerebellar infarct.  EF normal range.  TCD bubble study negative.  LDL 138, A1c 6.0.  Hypercoagulable work-up negative.  Bilateral lower extremity DVT negative.  Discharged with DAPT for 3 weeks and then aspirin alone, as well as Lipitor 40.  After discharge she follow-up with Dr. [name] at [facility], had loop recorder placed in 02/2020.  Hypertension  Stable  Resumed amlodipine 5mg   BP goal < 160 given hemorrhagic conversion  Long-term BP goal normotensive  Hyperlipidemia  Home meds:  Lipitor 40mg   LDL 51, at goal < 70  Continue lipitor 40  Continue statin at discharge  Other Stroke Risk Factors    Other Active Problems    Hospital day # 7  I had discussion with husband at the bedside, updated pt current condition, treatment plan and potential prognosis, and answered all the questions.  He expressed understanding and appreciation.   , MD PhD Stroke Neurology 03/05/2021 3:13 PM    To contact Stroke Continuity provider, please refer to [name]. After hours, contact General Neurology

## 2021-03-05 NOTE — Plan of Care (Signed)
  Problem: RH Dressing Goal: LTG Patient will perform lower body dressing w/assist (OT) Description: LTG: Patient will perform lower body dressing with assist, with/without cues in positioning using equipment (OT) Flowsheets (Taken 03/05/2021 1415) LTG: Pt will perform lower body dressing with assistance level of: (LTG downgraded due to safety concerns.) Contact Guard/Touching assist Note: LTG downgraded due to safety concerns.   Problem: RH Toileting Goal: LTG Patient will perform toileting task (3/3 steps) with assistance level (OT) Description: LTG: Patient will perform toileting task (3/3 steps) with assistance level (OT)  Flowsheets (Taken 03/05/2021 1415) LTG: Pt will perform toileting task (3/3 steps) with assistance level: (LTG downgraded due to safety concerns.) Contact Guard/Touching assist Note: LTG downgraded due to safety concerns.   Problem: RH Toilet Transfers Goal: LTG Patient will perform toilet transfers w/assist (OT) Description: LTG: Patient will perform toilet transfers with assist, with/without cues using equipment (OT) Flowsheets (Taken 03/05/2021 1415) LTG: Pt will perform toilet transfers with assistance level of: (LTG downgraded due to safety concerns.) Contact Guard/Touching assist Note: LTG downgraded due to safety concerns.   Problem: RH Tub/Shower Transfers Goal: LTG Patient will perform tub/shower transfers w/assist (OT) Description: LTG: Patient will perform tub/shower transfers with assist, with/without cues using equipment (OT) Flowsheets (Taken 03/05/2021 1415) LTG: Pt will perform tub/shower stall transfers with assistance level of: (LTG downgraded due to safety concerns.) Contact Guard/Touching assist Note: LTG downgraded due to safety concerns.

## 2021-03-05 NOTE — Progress Notes (Signed)
Speech Language Pathology Daily Session Note  Patient Details  Name: Judy Gutierrez MRN: [id number] Date of Birth: Apr 11, 1961  Today's Date: 03/05/2021 SLP Individual Time: 1445-1530 SLP Individual Time Calculation (min): 45 min  Short Term Goals: Week 1: SLP Short Term Goal 1 (Week 1): Pt will understand, recall and utilize compensatory strategies for dysarthria with 90% accuracy Supervision A SLP Short Term Goal 2 (Week 1): Pt will complete medication management task with 100% accuracy Supervision A SLP Short Term Goal 3 (Week 1): Pt will complete complex problem solving task for home environment with 90% accuracy Supervision A  Skilled Therapeutic Interventions:   Patient seen for skilled ST session focusing on cognitive and speech goals. Patient sitting up in [facility] in room with husband present. She participated in subtests of CLQT and received a score on subtests in Memory category, of 125 and corresponding to moderate impairment of memory. Some subtests were deferred secondary to current visual impairment. Patient was somewhat impulsive and trying to move through test items quickly, sacrificing some accuracy. SLP then directed patient to complete oral reading of the "Grandfather Passage". SLP initially had to cue her to slow down and use speech strategies, however patient then started to demonstrate some improved spontaneous slowing down of speech rate. She was able to recall and restate previously learned speech strategies with acronym she had learned from previous SLP, "SLOP". She then correctly named and described which each letter meant (slow, loud, over articulate, pause between words). Patient continues to benefit from skilled SLP intervention to maximize speech, language and cognitive function skills prior to discharge.  Pain Pain Assessment Pain Scale: 0-10 Pain Score: 0-No pain  Therapy/Group: Individual Therapy   Angela Nevin, MA, CCC-SLP Speech Therapy

## 2021-03-05 NOTE — Progress Notes (Signed)
Occupational Therapy Weekly Progress Note  Patient Details  Name: Judy Gutierrez MRN: [id number] Date of Birth: [birth year]  Beginning of progress report period: Feb 27, 2021 End of progress report period: Mar 05, 2021  Patient has met 4 of 4 short term goals.  Pt has made good progress this week, but has also had several more strokes.  Due to this, the pt reaching a S level only is not recommended. She really will need to have CGA to ensure her safety as she continues to have intermittent dizziness, diplopia, trunkal ataxia with balance deficits.  Patient continues to demonstrate the following deficits: muscle weakness, decreased cardiorespiratoy endurance, impaired timing and sequencing and unbalanced muscle activation, decreased visual motor skills and diplopia, decreased safety awareness, central origin and decreased sitting balance, decreased standing balance, decreased postural control and decreased balance strategies and therefore will continue to benefit from skilled OT intervention to enhance overall performance with BADL.  Patient not progressing toward long term goals.  See goal revision..  Plan of care revisions: LTGs downgraded from supervision to [place].  .  OT Short Term Goals Week 1:  OT Short Term Goal 1 (Week 1): Pt will complete toileting tasks at CGA level OT Short Term Goal 1 - Progress (Week 1): Met OT Short Term Goal 2 (Week 1): Pt will don pants with CGA OT Short Term Goal 2 - Progress (Week 1): Met OT Short Term Goal 3 (Week 1): Pt will utilize appropriate visual compensation strategies during ADL routine with min cueing OT Short Term Goal 3 - Progress (Week 1): Met OT Short Term Goal 4 (Week 1): Pt will transfer to walk in shower with min A OT Short Term Goal 4 - Progress (Week 1): Met Week 2:  OT Short Term Goal 2 (Week 2): STGs= LTGs       Therapy Documentation Precautions:  Precautions Precautions: Fall Precaution Comments: Diplopia, trunk/RUE/LE  ataxic Restrictions Weight Bearing Restrictions: No    Vital Signs: Therapy Vitals Temp: 98.3 F (36.8 C) Temp Source: Oral Pulse Rate: 93 Resp: 16 BP: 133/78 Patient Position (if appropriate): Lying Oxygen Therapy SpO2: 99 % O2 Device: Room Air    ADL: ADL Eating: Supervision/safety Where Assessed-Eating: Edge of bed Grooming: Setup Where Assessed-Grooming: Standing at sink Upper Body Bathing: Supervision/safety Where Assessed-Upper Body Bathing: Shower Lower Body Bathing: Contact guard Where Assessed-Lower Body Bathing: Shower Upper Body Dressing: Setup Where Assessed-Upper Body Dressing: Edge of bed Lower Body Dressing: Contact guard Where Assessed-Lower Body Dressing: Edge of bed Toileting: Contact guard Where Assessed-Toileting: Glass blower/designer: Therapist, music Method: Counselling psychologist: Energy manager: Curator Method: Heritage manager: Radio broadcast assistant      Therapy/Group: Individual Therapy  [place] 03/05/2021, 2:15 PM

## 2021-03-05 NOTE — Progress Notes (Addendum)
Physical Therapy Weekly Progress Note  Patient Details  Name: Judy Gutierrez MRN: [id number] Date of Birth: [birth year]   Beginning of progress report period: Feb 27, 2021 End of progress report period: Mar 05, 2021  Today's Date: 03/05/2021 PT Individual Time: 0945-1100 PT Individual Time Calculation (min): 75 min   Patient has met 1 of 3 short term goals. 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 She can complete bed mobility with supervision, bed<>chair transfers with CGA, and requires minA to ambulate 153f with RW and 710fmodA with no AD. She continues to be quite ataxic with R hemibody and truncal ataxia. She is also limited by cognitive deficits such as decreased problem solving, poor safety awareness, and decreased insight into her deficits.  Patient continues to demonstrate the following deficits muscle weakness, decreased cardiorespiratoy endurance, impaired timing and sequencing, unbalanced muscle activation, motor apraxia, decreased coordination and decreased motor planning, decreased visual perceptual skills and decreased visual motor skills, decreased motor planning, decreased attention, decreased awareness, decreased problem solving, decreased safety awareness and decreased memory and decreased sitting balance, decreased standing balance, decreased postural control and decreased balance strategies and therefore will continue to benefit from skilled PT intervention to increase functional independence with mobility.  Patient progressing toward long term goals. Continue plan of care.  PT Short Term Goals Week 1:  PT Short Term Goal 1 (Week 1): Patient will complete bed<> wc transfers with LRAD and CGA consistently PT Short Term Goal 2 (Week 1): Patient will ambulate at least 15061fith LRAD and CGA consistently PT Short Term Goal 3 (Week 1): Patient will improve  BBS >5 pts Week 2:  PT Short Term Goal 1 (Week 2): STG = LTG due to ELOS  Skilled Therapeutic Interventions/Progress Updates:    Pt greeted supine in bed at start of session, agreeable to PT. No reports of pain or dizziness. Supine<>sit with supervision. Pt was able to don socks with setupA while seated EOB but required modA for donning tennis shoes while seated EOB. Stand<>pivot transfer completed with CGA to w/c. Wheeled to ADL apartment in w/c - instructed on item retrieval in kitchen - instructed to locate 4 items (jellow, coffee cup, measuring cup, and mixing bowl). Pt was able to recall 3/4 items and was able to locate 3/4 items in the kitchen - she required minA for gait with RW in the kitchen and mod cues for safety awareness while reaching for items in the cabinets. Wheeled to main rehab gym and completed stand<>pivot transfer with CGA to mat table. Instructed on 1x15 repeated sit<>stands with CGA and no AD from mat table - truncal ataxia with forward leaning bias. Completed NMR for trunk control by performing modified child's pose with ball roll out forward/backwards with CGA for balance while seated edge of mat. Also completed RUE "CPR" press's into the therapy ball for RUE proprioception training. Gait ~125f29fth minA and RW with 1.5# ankle weight bilaterally for improved BLE coordination/proprioception- gait continues to be ataxic with narrow BOS and difficulty managing RW. Continued NMR with repeated sit<>stands on compliant surface (blue air-ex foam pad) with task overlay for using RUE to reach and grasp resistive clothes pin to attach to net on her R side - pt needing min/modA for balance while completing this. She needed several rest breaks due to fatigue with this activity. BP reading 109/81 (HR103) in sitting and remeasured in standing  reading 89/70 [phone]). Returned to w/c with CGA via stand pivot and wheeled back to room. RN made aware of pt's response to mobility and BP orthostasis. Pt  completed stand<>pivot with CGA back to EOB and bed mobility with supervision. Bed alarm activated and needs in reach at end of session.  Therapy Documentation Precautions:  Precautions Precautions: Fall Precaution Comments: Diplopia, trunk/RUE/LE ataxic Restrictions Weight Bearing Restrictions: No General:    Therapy/Group: Individual Therapy  Keileigh Vahey P Janoah Menna PT 03/05/2021, 7:28 AM

## 2021-03-05 NOTE — Progress Notes (Signed)
PROGRESS NOTE   Subjective/Complaints: Patient seen lying in bed this morning.  She states she slept well overnight.  She states she is awaiting therapies.  She states she has discussed with neurology regarding new stroke.  She was seen by neurology yesterday, notes reviewed- Plavix added.  ROS: Denies CP, SOB, N/V/D  Objective:   CT ANGIO HEAD NECK W WO CM  Result Date: 03/03/2021 CLINICAL DATA:  Stroke follow-up. EXAM: CT ANGIOGRAPHY HEAD AND NECK TECHNIQUE: Multidetector CT imaging of the head and neck was performed using the standard protocol during bolus administration of intravenous contrast. Multiplanar CT image reconstructions and MIPs were obtained to evaluate the vascular anatomy. Carotid stenosis measurements (when applicable) are obtained utilizing NASCET criteria, using the distal internal carotid diameter as the denominator. CONTRAST:  71mL OMNIPAQUE IOHEXOL 350 MG/ML SOLN COMPARISON:  CT angiogram of the head and neck Feb 23, 2021. FINDINGS: CTA NECK FINDINGS Aortic arch: 4 vessel aortic arch with direct origin of the left vertebral artery from the aortic arch in. Imaged portion shows no evidence of aneurysm or dissection. No significant stenosis of the major arch vessel origins. Right carotid system: Minimal atherosclerotic changes of the right carotid bifurcation without hemodynamically significant stenosis. Left carotid system: Mild atherosclerotic changes of the left carotid bifurcation without hemodynamically significant stenosis. Vertebral arteries: Right dominant. The non dominant left vertebral artery has normal course and caliber from the aortic arch to the vertebrobasilar junction. Area of luminal irregularity is again seen in the V3 segment of the right vertebral artery now with mild stenosis followed by small (1-2 mm) outpouching suggesting pseudoaneurysm. There is also luminal irregularity at the V4 segment. Skeleton:  Negative. Other neck: Negative. Upper chest: Negative. Review of the MIP images confirms the above findings CTA HEAD FINDINGS Anterior circulation: No significant stenosis, proximal occlusion, aneurysm, or vascular malformation. Posterior circulation: Mild luminal irregularity of the V4 segment of the right vertebral artery which involves the origin of the right PICA which appear patent. The basilar artery is small in caliber without area of focal stenosis, likely related to the presence of bilateral prominent posterior communicating arteries. The right P1/PCA segment is absent (fetal PCA). Interval recanalization of the right superior cerebellar artery. Venous sinuses: As permitted by contrast timing, patent. Anatomic variants: Right fetal PCA. Prominent left posterior communicating artery. Review of the MIP images confirms the above findings IMPRESSION: 1. Luminal irregularity in the V3 and V4 segments of the right vertebral artery is suggestive of dissection without hemodynamically significant stenosis. Area of irregularity of the origin of the right PICA which remains patent. There is a new diminutive outpouching from the right V3 segment suggestive of a small pseudoaneurysm. 2. Interval recanalization of the right superior cerebellar artery. 3. No significant findings in the anterior circulation. Electronically Signed   By: Baldemar Lenis M.D.   On: 03/03/2021 12:54   CT HEAD WO CONTRAST  Result Date: 03/04/2021 CLINICAL DATA:  60 year old female with new right PICA infarct superimposed on recent right SCA infarct. EXAM: CT HEAD WITHOUT CONTRAST TECHNIQUE: Contiguous axial images were obtained from the base of the skull through the vertex without intravenous contrast. COMPARISON:  Brain MRI,  CTA head and neck, CT head yesterday. FINDINGS: Brain: Supratentorial gray-white matter differentiation remains stable. And the lateral and 3rd ventricle size and configuration is stable with temporal  horns remaining diminutive on series 3, image 9. Subacute right SCA infarct with petechial hemorrhage and more acute confluent right PICA infarct with cytotoxic edema redemonstrated. Stable posterior fossa mass effect including on the 4th ventricle. Superimposed chronic left SCA cerebellar infarct. Posterior fossa gray-white matter differentiation and basilar cisterns are stable since 0645 hours yesterday. Vascular: Mild Calcified atherosclerosis at the skull base. Skull: Stable.  No acute osseous abnormality identified. Sinuses/Orbits: Stable occasional sinus mucosal thickening and bubbly opacity. Tympanic cavities and mastoids remain clear. Other: Leftward gaze deviation as before. Visualized scalp soft tissues are within normal limits. IMPRESSION: 1. Stable since yesterday. Confluent Right PICA infarct superimposed on subacute Right SCA infarct with petechial hemorrhage. Stable posterior fossa mass effect. No lateral or 3rd ventriculomegaly at this time. 2. No new intracranial abnormality. Electronically Signed   By: Odessa Fleming M.D.   On: 03/04/2021 06:08   MR BRAIN WO CONTRAST  Result Date: 03/03/2021 CLINICAL DATA:  Stroke follow-up EXAM: MRI HEAD WITHOUT CONTRAST TECHNIQUE: Multiplanar, multiecho pulse sequences of the brain and surrounding structures were obtained without intravenous contrast. COMPARISON:  Head CT Mar 03, 2021. FINDINGS: Brain: Large area of restricted diffusion involving the inferior right cerebellar hemisphere consistent with acute/subacute right PICA territory infarct. Patchy T1 hyperintensity with prominent susceptibility artifact in the superior aspect of the right cerebellar hemisphere with surrounding vasogenic edema is suggestive of hemorrhagic transformation of non prior right SCA territory infarct. Edema and mass effect from these 2 areas of infarcts resulting in leftward midline shift with mass effect on the fourth ventricle which remains patent as well as mass effect on the  posterior aspect of the pons and mid brain with mild periaqueductal edema and small ascending transtentorial herniation. No tonsillar herniation. No hydrocephalus. Overall, findings are similar to prior CT performed earlier today Area of encephalomalacia and gliosis with hemosiderin staining in the superior left cerebellar hemisphere consistent with chronic left SCA territory infarct. Vascular: Normal flow voids. Skull and upper cervical spine: Normal marrow signal. Sinuses/Orbits: Mucosal thickening with bubbly secretion in the left maxillary and sphenoid sinuses. The orbits are maintained. Other: Right mastoid effusion. IMPRESSION: Large acute/subacute right PICA territory infarct and late subacute right SCA territory infarct with superimposed hemorrhagic transformation. Edema from of right cerebellar hemisphere infarcts results in leftward midline shift with mass effect on the fourth ventricle, mid brain and pons without evidence of hydrocephalus. Findings appear stable compared to head CT performed earlier today. Electronically Signed   By: Baldemar Lenis M.D.   On: 03/03/2021 13:13   No results for input(s): WBC, HGB, HCT, PLT in the last 72 hours. Recent Labs    03/05/21 0615  NA 134*  K 3.9  CL 104  CO2 25  GLUCOSE 116*  BUN 10  CREATININE 0.63  CALCIUM 8.8*    Intake/Output Summary (Last 24 hours) at 03/05/2021 1015 Last data filed at 03/05/2021 0700 Gross per 24 hour  Intake 480 ml  Output --  Net 480 ml        Physical Exam: Vital Signs Blood pressure 132/83, pulse 80, temperature 98.2 F (36.8 C), temperature source Oral, resp. rate 20, height 5\' 2"  (1.575 m), weight 60.2 kg, last menstrual period 12/08/2010, SpO2 99 %.  Constitutional: No distress . Vital signs reviewed. HENT: Normocephalic.  Atraumatic. Eyes: EOMI. No  discharge. Cardiovascular: No JVD.  RRR. Respiratory: Normal effort.  No stridor.  Bilateral clear to auscultation. GI: Non-distended.  BS  +. Skin: Warm and dry.  Intact. Psych: Normal mood.  Normal behavior. Musc: No edema in extremities.  No tenderness in extremities. Neuro: Alert Motor: 5/5 throughout RUE dysmetria and ataxia Dysarthria, slightly worse  Assessment/Plan: 1. Functional deficits which require 3+ hours per day of interdisciplinary therapy in a comprehensive inpatient rehab setting.  Physiatrist is providing close team supervision and 24 hour management of active medical problems listed below.  Physiatrist and rehab team continue to assess barriers to discharge/monitor patient progress toward functional and medical goals  Care Tool:  Bathing    Body parts bathed by patient: Right arm,Right lower leg,Left arm,Chest,Abdomen,Front perineal area,Buttocks,Left upper leg,Left lower leg,Face,Right upper leg         Bathing assist Assist Level: Contact Guard/Touching assist     Upper Body Dressing/Undressing Upper body dressing   What is the patient wearing?: Pull over shirt,Bra    Upper body assist Assist Level: Minimal Assistance - Patient > 75%    Lower Body Dressing/Undressing Lower body dressing      What is the patient wearing?: Underwear/pull up,Pants     Lower body assist Assist for lower body dressing: Contact Guard/Touching assist     Toileting Toileting    Toileting assist Assist for toileting: Contact Guard/Touching assist     Transfers Chair/bed transfer  Transfers assist     Chair/bed transfer assist level: Minimal Assistance - Patient > 75%     Locomotion Ambulation   Ambulation assist      Assist level: Minimal Assistance - Patient > 75% Assistive device: Walker-platform Max distance: 42ft   Walk 10 feet activity   Assist     Assist level: Minimal Assistance - Patient > 75% Assistive device: Walker-rolling   Walk 50 feet activity   Assist    Assist level: Minimal Assistance - Patient > 75% Assistive device: Walker-rolling    Walk 150 feet  activity   Assist    Assist level: Minimal Assistance - Patient > 75% Assistive device: Walker-rolling    Walk 10 feet on uneven surface  activity   Assist Walk 10 feet on uneven surfaces activity did not occur: Safety/medical concerns         Wheelchair     Assist Will patient use wheelchair at discharge?: No             Wheelchair 50 feet with 2 turns activity    Assist            Wheelchair 150 feet activity     Assist           Medical Problem List and Plan: 1.  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 Increased edema associated with the right cerebellar and right dorsal midbrain infarcts with interval development of acute hemorrhage at the site of right cerebellar infarct. Increased resulting mass effect on the fourth ventricle without hydrocephalus.      2. Paranasal sinus mucosal thickening with frothy secretions.  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  Leftward midline shift with mass-effect on fourth ventricle, but no hydrocephalus  Appreciate Neuro recs, discussed with Neuro - following closely  Continue CIR 2.  Antithrombotics: -DVT/anticoagulation: Lovenox             -antiplatelet therapy: Aspirin 81 mg daily, Brilinta d/ced 3. Pain Management: Tylenol as needed             Monitor, particularly for headaches with increased exertion  Controlled on 5/20 4. Mood: Provide emotional support             -antipsychotic agents: N/A 5. Neuropsych: This patient is capable of making decisions on her own  behalf. 6. Skin/Wound Care: Routine skin checks 7. Fluids/Electrolytes/Nutrition: Routine in and outs 8.  Hypertension.  Patient on Cozaar50 mg daily, Norvasc 5 mg daily prior to admission.    Norvasc 5 started on 5/16             Vitals with BMI 03/05/2021 03/04/2021 03/04/2021  Height - - -  Weight - - -  BMI - - -  Systolic 132 144 [id number]  Diastolic 83 86 77  Pulse 80 85 87   Relatively controlled on 5/20 9. Hyperlipidemia: Lipitor 10. ABLA             Hb 11.1 on 5/16, labs ordered for Monday  Cont to monitor 12. Transaminitis  AST elevated, but improving on 5/20  Cont to monitor 13. Dysarthria  SLP ordered 14.  Hyponatremia  Sodium 134 on 5/20, labs ordered for Monday  LOS: 7 days A FACE TO FACE EVALUATION WAS PERFORMED  Shariq Puig Karis JubaAnil Camree Wigington 03/05/2021, 10:15 AM

## 2021-03-05 NOTE — Progress Notes (Signed)
Occupational Therapy Session Note  Patient Details  Name: Judy Gutierrez MRN: [id number] Date of Birth: [birth year]  70 min    Short Term Goals: Week 1:  OT Short Term Goal 1 (Week 1): Pt will complete toileting tasks at CGA level OT Short Term Goal 1 - Progress (Week 1): Met OT Short Term Goal 2 (Week 1): Pt will don pants with CGA OT Short Term Goal 2 - Progress (Week 1): Met OT Short Term Goal 3 (Week 1): Pt will utilize appropriate visual compensation strategies during ADL routine with min cueing OT Short Term Goal 3 - Progress (Week 1): Met OT Short Term Goal 4 (Week 1): Pt will transfer to walk in shower with min A OT Short Term Goal 4 - Progress (Week 1): Met  Skilled Therapeutic Interventions/Progress Updates:    1:1. Pt received in bed agreeable to OT with no pain and BP stable throughout session with teds applied. Focus of session on adaptive yoga for trunk control, ataxia, postural control, core stability, muscle strengthening, balance (seated/standing), breath work for autonomic regulation, and return to preferred leisure activity. Pt very pleased with seated and standing variations of favorite postures. Pt requires multimodal cuing to achieve/maintain postures fading to only verbal cuing. Exited session with pt seated in bed, exit alarm on and call light in reach   Therapy Documentation Precautions:  Precautions Precautions: Fall Precaution Comments: Diplopia, trunk/RUE/LE ataxic Restrictions Weight Bearing Restrictions: No General:   Vital Signs: Therapy Vitals Temp: 98.2 F (36.8 C) Temp Source: Oral Pulse Rate: 80 Resp: 20 BP: 132/83 Patient Position (if appropriate): Lying Oxygen Therapy SpO2: 99 % O2 Device: Room Air Pain:   ADL: ADL Eating: Supervision/safety Where Assessed-Eating: Edge of bed Grooming: Setup Where Assessed-Grooming: Standing at sink Upper Body Bathing: Supervision/safety Where Assessed-Upper Body Bathing: Shower Lower Body  Bathing: Supervision/safety Where Assessed-Lower Body Bathing: Shower Upper Body Dressing: Setup Where Assessed-Upper Body Dressing: Edge of bed Lower Body Dressing: Supervision/safety Where Assessed-Lower Body Dressing: Edge of bed Toileting: Supervision/safety Where Assessed-Toileting: Glass blower/designer: Therapist, music Method: Counselling psychologist: Energy manager: Curator Method: Heritage manager: Nurse, learning disability    Praxis   Exercises:   Other Treatments:     Therapy/Group: Individual Therapy  Tonny Branch 03/05/2021, 7:01 AM

## 2021-03-05 NOTE — Progress Notes (Signed)
Nurse notified patient reported dizzy during therapy session. PA informed. Will reassess patient after rest. Cletis Media, LPN

## 2021-03-06 ENCOUNTER — Inpatient Hospital Stay (HOSPITAL_COMMUNITY): Payer: No Typology Code available for payment source

## 2021-03-06 NOTE — Progress Notes (Signed)
Occupational Therapy Session Note  Patient Details  Name: Dyamon Sosinski MRN: [id number] Date of Birth: 13-Jan-1961  Today's Date: 03/06/2021 OT Missed Time: 60 Minutes Missed Time Reason: Patient on bedrest  Pt missed skilled OT as pt on bedrest. Will follow up when appropriate Shon Hale 03/06/2021, 6:47 AM

## 2021-03-06 NOTE — Progress Notes (Signed)
PROGRESS NOTE   Subjective/Complaints: Patient seen laying in bed this morning.  She states she slept well overnight.  She has questions regarding repeat CT.  She is seen by neurology yesterday, notes reviewed- no changes.  ROS: Denies CP, SOB, N/V/D  Objective:   CT HEAD WO CONTRAST  Addendum Date: 03/06/2021   ADDENDUM REPORT: 03/06/2021 07:54 ADDENDUM: Study discussed by telephone with Dr. Iver Nestle on 03/06/2021 at 0611 hours. Electronically Signed   By: Odessa Fleming M.D.   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 Subsequent encounter. EXAM: CT HEAD WITHOUT CONTRAST TECHNIQUE: Contiguous axial images were obtained from the base of the skull through the vertex without intravenous contrast. COMPARISON:  Head CT 03/04/2021 and earlier. FINDINGS: Brain: Lobular hyperdense hemorrhage has progressed in the right SCA territory since 03/04/2021, with increasing regional edema and mass effect compatible with progressive hemorrhagic transformation (coronal image 46). The volume of hyperdense blood there is approximately 3 mL. Confluent right PICA infarct with cytotoxic edema which has mildly progressed since 03/03/2021. Combined, there is mild progressive basilar cistern effacement, although the cisterna magna remains patent. Fourth ventricle remains effaced. Since 03/03/2021 lateral and 3rd ventricle size is slightly increased (series 3, image 14 today versus series 3, image 17 on 03/03/2021). No supratentorial transependymal edema. Supratentorial gray-white matter differentiation preserved with no midline shift. No supratentorial hemorrhage. Vascular: Mild Calcified atherosclerosis at the skull base. Skull: Stable. Small benign right frontal bone exostosis on series 4, image 41. Sinuses/Orbits: Stable paranasal sinuses. Tympanic cavities and mastoids  remain clear. Other: Stable leftward gaze deviation. Visualized scalp soft tissues are within normal limits. IMPRESSION: 1. 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 2. At the same time cytotoxic edema from the large right PICA infarct has mildly progressed. 3. Subsequent increased posterior fossa mass although the pre medullary cistern and cisterna magna remain patent. 4. There has been subtle enlargement of the lateral and 3rd ventricles since 03/03/2021, but there is no transependymal edema. Electronically Signed: By: Odessa Fleming M.D. On: 03/06/2021 06:05   No results for input(s): WBC, HGB, HCT, PLT in the last 72 hours. Recent Labs    03/05/21 0615  NA 134*  K 3.9  CL 104  CO2 25  GLUCOSE 116*  BUN 10  CREATININE 0.63  CALCIUM 8.8*    Intake/Output Summary (Last 24 hours) at 03/06/2021 0909 Last data filed at 03/05/2021 1230 Gross per 24 hour  Intake 150 ml  Output --  Net 150 ml        Physical Exam: Vital Signs Blood pressure 134/83, pulse 87, temperature 97.7 F (36.5 C), resp. rate 15, height 5\' 2"  (1.575 m), weight 58.1 kg, last menstrual period 12/08/2010, SpO2 100 %.  Constitutional: No distress . Vital signs reviewed. HENT: Normocephalic.  Atraumatic. Eyes: EOMI. No discharge. Cardiovascular: No JVD.  RRR. Respiratory: Normal effort.  No stridor.  Bilateral clear to auscultation. GI: Non-distended.  BS +. Skin: Warm and dry.  Intact. Psych: Normal mood.  Normal behavior. Musc: No edema in  extremities.  No tenderness in extremities. Neuro: Alert Motor: 5/5 throughout RUE dysmetria and ataxia Dysarthria, stable  Assessment/Plan: 1. Functional deficits which require 3+ hours per day of interdisciplinary therapy in a comprehensive inpatient rehab setting.  Physiatrist is providing close team supervision and 24 hour management of active medical problems listed  below.  Physiatrist and rehab team continue to assess barriers to discharge/monitor patient progress toward functional and medical goals  Care Tool:  Bathing    Body parts bathed by patient: Right arm,Right lower leg,Left arm,Chest,Abdomen,Front perineal area,Buttocks,Left upper leg,Left lower leg,Face,Right upper leg         Bathing assist Assist Level: Contact Guard/Touching assist     Upper Body Dressing/Undressing Upper body dressing   What is the patient wearing?: Pull over shirt,Bra    Upper body assist Assist Level: Minimal Assistance - Patient > 75%    Lower Body Dressing/Undressing Lower body dressing      What is the patient wearing?: Underwear/pull up,Pants     Lower body assist Assist for lower body dressing: Contact Guard/Touching assist     Toileting Toileting    Toileting assist Assist for toileting: Contact Guard/Touching assist     Transfers Chair/bed transfer  Transfers assist     Chair/bed transfer assist level: Contact Guard/Touching assist     Locomotion Ambulation   Ambulation assist      Assist level: Minimal Assistance - Patient > 75% Assistive device: Walker-rolling Max distance: 173ft   Walk 10 feet activity   Assist     Assist level: Minimal Assistance - Patient > 75% Assistive device: Walker-rolling   Walk 50 feet activity   Assist    Assist level: Minimal Assistance - Patient > 75% Assistive device: Walker-rolling    Walk 150 feet activity   Assist    Assist level: Minimal Assistance - Patient > 75% Assistive device: Walker-rolling    Walk 10 feet on uneven surface  activity   Assist Walk 10 feet on uneven surfaces activity did not occur: Safety/medical concerns         Wheelchair     Assist Will patient use wheelchair at discharge?: No             Wheelchair 50 feet with 2 turns activity    Assist            Wheelchair 150 feet activity     Assist            Medical Problem List and Plan: 1.  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 Increased edema associated with the right cerebellar and right dorsal midbrain infarcts with interval development of acute hemorrhage at the site of right cerebellar infarct. Increased resulting mass effect on the fourth ventricle without hydrocephalus.      2. Paranasal sinus mucosal thickening with frothy secretions.  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  Leftward midline shift  with mass-effect on fourth ventricle, but no hydrocephalus  Appreciate Neuro recs, discussed with Neuro - following closely  Bedrest for now  2.  Antithrombotics: -DVT/anticoagulation: Lovenox              -antiplatelet therapy: Aspirin 81 mg daily, Brilinta d/ced, Plavix initiated, but also DC'd on 5/21 due to progression of hemorrhage 3. Pain Management: Tylenol as needed             Monitor, particularly for headaches with increased exertion  Controlled on 5/20 4. Mood: Provide emotional support             -antipsychotic agents: N/A 5. Neuropsych: This patient is capable of making decisions on her own behalf. 6. Skin/Wound Care: Routine skin checks 7. Fluids/Electrolytes/Nutrition: Routine in and outs 8.  Hypertension.  Patient on Cozaar50 mg daily, Norvasc 5 mg daily prior to admission.    Norvasc 5 started on 5/16              Vitals with BMI 03/06/2021 03/06/2021 03/05/2021  Height - - -  Weight - - -  BMI - - -  Systolic 134 112 782  Diastolic 83 62 68  Pulse 87 87 86   Relatively controlled on 5/21 9. Hyperlipidemia: Lipitor 10. ABLA             Hb 11.1 on 5/16, labs ordered for Monday  Cont to monitor 12. Transaminitis  AST elevated, but improving on 5/20  Cont to monitor 13. Dysarthria  SLP ordered 14.  Hyponatremia  Sodium 134 on 5/20, labs ordered for Monday  LOS: 8 days A FACE TO FACE EVALUATION WAS PERFORMED  Judy Gutierrez 03/06/2021, 9:09 AM

## 2021-03-06 NOTE — Progress Notes (Signed)
STROKE TEAM PROGRESS NOTE   SUBJECTIVE (INTERVAL HISTORY) Husband is at bedside.  Patient neurologically unchanged, still has mild diplopia and right arm incoordination   .  Repeat CTA in a.m. shows slight increase in hemorrhagic transformation and mild mass-effect on fourth ventricle but no hydrocephalus or intraventricular extension.  Another CT scan is planned for this afternoon at 2 PM.  Patient denies any headache mental status is clear without any drowsiness.  Brilinta is on hold and she is on aspirin alone.  Vital signs are stable.  Neurological exam is unchanged. OBJECTIVE Temp:  [97.7 F (36.5 C)-98.8 F (37.1 C)] 98.8 F (37.1 C) (05/21 1249) Pulse Rate:  [86-88] 88 (05/21 1249) Resp:  [15-20] 17 (05/21 1249) BP: (110-134)/(62-83) 133/78 (05/21 1249) SpO2:  [95 %-100 %] 100 % (05/21 1249)  No results for input(s): GLUCAP in the last 168 hours. Recent Labs  Lab 03/01/21 0500 03/05/21 0615  NA 136 134*  K 4.0 3.9  CL 105 104  CO2 26 25  GLUCOSE 106* 116*  BUN 16 10  CREATININE 0.62 0.63  CALCIUM 8.7* 8.8*   Recent Labs  Lab 03/01/21 0500 03/05/21 0615  AST 56* 45*  ALT 47* 40  ALKPHOS 59 72  BILITOT 1.2 0.8  PROT 6.8 7.5  ALBUMIN 3.0* 3.4*   Recent Labs  Lab 03/01/21 0500  WBC 5.1  NEUTROABS 2.5  HGB 11.1*  HCT 33.9*  MCV 89.0  PLT 214   No results for input(s): CKTOTAL, CKMB, CKMBINDEX, TROPONINI in the last 168 hours. No results for input(s): LABPROT, INR in the last 72 hours. No results for input(s): COLORURINE, LABSPEC, PHURINE, GLUCOSEU, HGBUR, BILIRUBINUR, KETONESUR, PROTEINUR, UROBILINOGEN, NITRITE, LEUKOCYTESUR in the last 72 hours.  Invalid input(s): APPERANCEUR     Component Value Date/Time   CHOL 105 02/23/2021 0745   CHOL 191 09/09/2015 0804   TRIG 28 02/23/2021 0745   HDL 48 02/23/2021 0745   HDL 60 09/09/2015 0804   CHOLHDL 2.2 02/23/2021 0745   VLDL 6 02/23/2021 0745   LDLCALC 51 02/23/2021 0745   LDLCALC 120 (H) 09/09/2015 0804    Lab Results  Component Value Date   HGBA1C 6.0 (H) 02/23/2021      Component Value Date/Time   LABOPIA NONE DETECTED 02/23/2021 0033   COCAINSCRNUR NONE DETECTED 02/23/2021 0033   LABBENZ NONE DETECTED 02/23/2021 0033   AMPHETMU NONE DETECTED 02/23/2021 0033   THCU NONE DETECTED 02/23/2021 0033   LABBARB NONE DETECTED 02/23/2021 0033    No results for input(s): ETH in the last 168 hours.  I have personally reviewed the radiological images below and agree with the radiology interpretations.  CT ANGIO HEAD NECK W WO CM  Result Date: 03/03/2021 CLINICAL DATA:  Stroke follow-up. EXAM: CT ANGIOGRAPHY HEAD AND NECK TECHNIQUE: Multidetector CT imaging of the head and neck was performed using the standard protocol during bolus administration of intravenous contrast. Multiplanar CT image reconstructions and MIPs were obtained to evaluate the vascular anatomy. Carotid stenosis measurements (when applicable) are obtained utilizing NASCET criteria, using the distal internal carotid diameter as the denominator. CONTRAST:  50mL OMNIPAQUE IOHEXOL 350 MG/ML SOLN COMPARISON:  CT angiogram of the head and neck Feb 23, 2021. FINDINGS: CTA NECK FINDINGS Aortic arch: 4 vessel aortic arch with direct origin of the left vertebral artery from the aortic arch in. Imaged portion shows no evidence of aneurysm or dissection. No significant stenosis of the major arch vessel origins. Right carotid system: Minimal atherosclerotic changes of the  right carotid bifurcation without hemodynamically significant stenosis. Left carotid system: Mild atherosclerotic changes of the left carotid bifurcation without hemodynamically significant stenosis. Vertebral arteries: Right dominant. The non dominant left vertebral artery has normal course and caliber from the aortic arch to the vertebrobasilar junction. Area of luminal irregularity is again seen in the V3 segment of the right vertebral artery now with mild stenosis followed by  small (1-2 mm) outpouching suggesting pseudoaneurysm. There is also luminal irregularity at the V4 segment. Skeleton: Negative. Other neck: Negative. Upper chest: Negative. Review of the MIP images confirms the above findings CTA HEAD FINDINGS Anterior circulation: No significant stenosis, proximal occlusion, aneurysm, or vascular malformation. Posterior circulation: Mild luminal irregularity of the V4 segment of the right vertebral artery which involves the origin of the right PICA which appear patent. The basilar artery is small in caliber without area of focal stenosis, likely related to the presence of bilateral prominent posterior communicating arteries. The right P1/PCA segment is absent (fetal PCA). Interval recanalization of the right superior cerebellar artery. Venous sinuses: As permitted by contrast timing, patent. Anatomic variants: Right fetal PCA. Prominent left posterior communicating artery. Review of the MIP images confirms the above findings IMPRESSION: 1. Luminal irregularity in the V3 and V4 segments of the right vertebral artery is suggestive of dissection without hemodynamically significant stenosis. Area of irregularity of the origin of the right PICA which remains patent. There is a new diminutive outpouching from the right V3 segment suggestive of a small pseudoaneurysm. 2. Interval recanalization of the right superior cerebellar artery. 3. No significant findings in the anterior circulation. Electronically Signed   By: Baldemar Lenis M.D.   On: 03/03/2021 12:54   CT HEAD WO CONTRAST  Addendum Date: 03/06/2021   ADDENDUM REPORT: 03/06/2021 07:54 ADDENDUM: Study discussed by telephone with Dr. Iver Nestle on 03/06/2021 at 0611 hours. Electronically Signed   By: Odessa Fleming M.D.   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 Subsequent encounter. EXAM: CT HEAD WITHOUT CONTRAST  TECHNIQUE: Contiguous axial images were obtained from the base of the skull through the vertex without intravenous contrast. COMPARISON:  Head CT 03/04/2021 and earlier. FINDINGS: Brain: Lobular hyperdense hemorrhage has progressed in the right SCA territory since 03/04/2021, with increasing regional edema and mass effect compatible with progressive hemorrhagic transformation (coronal image 46). The volume of hyperdense blood there is approximately 3 mL. Confluent right PICA infarct with cytotoxic edema which has mildly progressed since 03/03/2021. Combined, there is mild progressive basilar cistern effacement, although the cisterna magna remains patent. Fourth ventricle remains effaced. Since 03/03/2021 lateral and 3rd ventricle size is slightly increased (series 3, image 14 today versus series 3, image 17 on 03/03/2021). No supratentorial transependymal edema. Supratentorial gray-white matter differentiation preserved with no midline shift. No supratentorial hemorrhage. Vascular: Mild Calcified atherosclerosis at the skull base. Skull: Stable. Small benign right frontal bone exostosis on series 4, image 41. Sinuses/Orbits: Stable paranasal sinuses. Tympanic cavities and mastoids remain clear. Other: Stable leftward gaze deviation. Visualized scalp soft tissues are within normal limits. IMPRESSION: 1. 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 2. At the same time cytotoxic edema from the large right PICA infarct has mildly progressed. 3. Subsequent increased posterior fossa mass although the pre medullary cistern and cisterna magna remain patent. 4. There has been subtle enlargement of the lateral and  3rd ventricles since 03/03/2021, but there is no transependymal edema. Electronically Signed: By: Odessa Fleming M.D. On: 03/06/2021 06:05   CT HEAD WO CONTRAST  Result Date: 03/04/2021 CLINICAL DATA:  60 year old female with  new right PICA infarct superimposed on recent right SCA infarct. EXAM: CT HEAD WITHOUT CONTRAST TECHNIQUE: Contiguous axial images were obtained from the base of the skull through the vertex without intravenous contrast. COMPARISON:  Brain MRI, CTA head and neck, CT head yesterday. FINDINGS: Brain: Supratentorial gray-white matter differentiation remains stable. And the lateral and 3rd ventricle size and configuration is stable with temporal horns remaining diminutive on series 3, image 9. Subacute right SCA infarct with petechial hemorrhage and more acute confluent right PICA infarct with cytotoxic edema redemonstrated. Stable posterior fossa mass effect including on the 4th ventricle. Superimposed chronic left SCA cerebellar infarct. Posterior fossa gray-white matter differentiation and basilar cisterns are stable since 0645 hours yesterday. Vascular: Mild Calcified atherosclerosis at the skull base. Skull: Stable.  No acute osseous abnormality identified. Sinuses/Orbits: Stable occasional sinus mucosal thickening and bubbly opacity. Tympanic cavities and mastoids remain clear. Other: Leftward gaze deviation as before. Visualized scalp soft tissues are within normal limits. IMPRESSION: 1. Stable since yesterday. Confluent Right PICA infarct superimposed on subacute Right SCA infarct with petechial hemorrhage. Stable posterior fossa mass effect. No lateral or 3rd ventriculomegaly at this time. 2. No new intracranial abnormality. Electronically Signed   By: Odessa Fleming M.D.   On: 03/04/2021 06:08   CT HEAD WO CONTRAST  Addendum Date: 03/03/2021   ADDENDUM REPORT: 03/03/2021 08:53 ADDENDUM: Study discussed by telephone with Dr. [name] on 03/03/2021 at 0847 hours. Electronically Signed   By: Odessa Fleming M.D.   On: 03/03/2021 08:53   Result Date: 03/03/2021 CLINICAL DATA:  60 year old female with right SCA cerebellar infarct on MRI 02/23/2021. Subsequent encounter. EXAM: CT HEAD WITHOUT CONTRAST TECHNIQUE: Contiguous  axial images were obtained from the base of the skull through the vertex without intravenous contrast. COMPARISON:  Head CT 03/01/2021 and earlier. FINDINGS: Brain: Right superior cerebellar artery territory and cerebellar vermis cytotoxic edema with petechial hemorrhage. But new confluent cytotoxic edema now in the right PICA territory (series 3, image 5). Subsequent increased posterior fossa mass effect, with decreased prepontine and pre medullary cisterns. No tonsillar herniation. Increased effacement of the 4th ventricle, but lateral and 3rd ventricle size not significantly changed at this time. Supratentorial gray and white matter signal is stable. No new intracranial hemorrhage. Vascular: Mild Calcified atherosclerosis at the skull base. No suspicious intracranial vascular hyperdensity. Skull: No acute osseous abnormality identified. Small benign right frontal bone exostosis on series 4, image 59. Sinuses/Orbits: Stable mucosal thickening and bubbly opacity in the left sphenoid, left maxillary, and posterior right ethmoid sinuses. Tympanic cavities and mastoids remain clear. Other: Leftward gaze deviation. Visualized scalp soft tissues are within normal limits. IMPRESSION: 1. Confluent acute right PICA infarct is new since 03/01/2021, superimposed on the recent right SCA infarct. Subsequent increased cytotoxic edema in the right cerebellum with increased posterior fossa mass effect including effaced 4th ventricle. But no malignant hemorrhagic transformation or ventriculomegaly at this time. 2. Supratentorial brain appears stable, negative. Electronically Signed: By: Odessa Fleming M.D. On: 03/03/2021 08:42   CT HEAD WO CONTRAST  Result Date: 03/01/2021 CLINICAL DATA:  Stroke follow-up. EXAM: CT HEAD WITHOUT CONTRAST TECHNIQUE: Contiguous axial images were obtained from the base of the skull through the vertex without intravenous contrast. COMPARISON:  CT head and MRI Feb 23, 2021. FINDINGS:  Brain: Increased edema  associated with the right cerebellar and right dorsal midbrain infarct. Increased resulting mass effect on the fourth ventricle without hydrocephalus. Interval development of acute hemorrhage at the site of right cerebellar infarct. No evidence of new/interval acute large vascular territory infarct. Remote left cerebellar infarct. Basal cisterns are patent. No inferior cerebellar tonsillar herniation or midline shift. No mass lesion. Vascular: No hyperdense vessel identified. Skull: No acute fracture. Sinuses/Orbits: Mucosal thickening the left maxillary sinus, right posterior ethmoid air cell and left sphenoid sinus with frothy secretions in the sinuses. Unremarkable orbits. Other: No mastoid effusions. IMPRESSION: 1. Increased edema associated with the right cerebellar and right dorsal midbrain infarcts with interval development of acute hemorrhage at the site of right cerebellar infarct. Increased resulting mass effect on the fourth ventricle without hydrocephalus. 2. Paranasal sinus mucosal thickening with frothy secretions, as detailed above. Electronically Signed   By: Feliberto Harts MD   On: 03/01/2021 14:39   MR BRAIN WO CONTRAST  Result Date: 03/03/2021 CLINICAL DATA:  Stroke follow-up EXAM: MRI HEAD WITHOUT CONTRAST TECHNIQUE: Multiplanar, multiecho pulse sequences of the brain and surrounding structures were obtained without intravenous contrast. COMPARISON:  Head CT Mar 03, 2021. FINDINGS: Brain: Large area of restricted diffusion involving the inferior right cerebellar hemisphere consistent with acute/subacute right PICA territory infarct. Patchy T1 hyperintensity with prominent susceptibility artifact in the superior aspect of the right cerebellar hemisphere with surrounding vasogenic edema is suggestive of hemorrhagic transformation of non prior right SCA territory infarct. Edema and mass effect from these 2 areas of infarcts resulting in leftward midline shift with mass effect on the fourth  ventricle which remains patent as well as mass effect on the posterior aspect of the pons and mid brain with mild periaqueductal edema and small ascending transtentorial herniation. No tonsillar herniation. No hydrocephalus. Overall, findings are similar to prior CT performed earlier today Area of encephalomalacia and gliosis with hemosiderin staining in the superior left cerebellar hemisphere consistent with chronic left SCA territory infarct. Vascular: Normal flow voids. Skull and upper cervical spine: Normal marrow signal. Sinuses/Orbits: Mucosal thickening with bubbly secretion in the left maxillary and sphenoid sinuses. The orbits are maintained. Other: Right mastoid effusion. IMPRESSION: Large acute/subacute right PICA territory infarct and late subacute right SCA territory infarct with superimposed hemorrhagic transformation. Edema from of right cerebellar hemisphere infarcts results in leftward midline shift with mass effect on the fourth ventricle, mid brain and pons without evidence of hydrocephalus. Findings appear stable compared to head CT performed earlier today. Electronically Signed   By: Baldemar Lenis M.D.   On: 03/03/2021 13:13   MR BRAIN WO CONTRAST  Result Date: 02/23/2021 CLINICAL DATA:  Dizziness, nausea and vomiting. EXAM: MRI HEAD WITHOUT CONTRAST TECHNIQUE: Multiplanar, multiecho pulse sequences of the brain and surrounding structures were obtained without intravenous contrast. COMPARISON:  CT studies same day FINDINGS: Brain: Diffusion imaging shows a 4-5 cm region of acute infarction within the superior cerebellum on the right. Acute infarction also within the dorsal right midbrain. Old infarction within the left superior cerebellum. Mild swelling but no acute hemorrhage in the right cerebellum. Old hemosiderin residual within the left cerebellar stroke. Cerebral hemispheres are normal. No mass, hydrocephalus or extra-axial collection. Vascular: No acute vascular  finding by standard MRI. Skull and upper cervical spine: Negative Sinuses/Orbits: Mucosal inflammatory changes of the left maxillary and sphenoid sinuses. Orbits negative. Other: None IMPRESSION: 4-5 cm region of acute infarction within the superior cerebellum on the right. Punctate acute  infarction in the right dorsal midbrain. Mild swelling. No evidence of acute hemorrhage. Old infarction in the left superior cerebellum with atrophy, encephalomalacia and gliosis and hemosiderin deposition. Electronically Signed   By: Paulina FusiMark  Shogry M.D.   On: 02/23/2021 08:42   CUP PACEART REMOTE DEVICE CHECK  Result Date: 02/09/2021 ILR summary report received. Battery status OK. Normal device function. No new symptom, tachy, brady, or pause episodes. No new AF episodes. Monthly summary reports and ROV/PRN.  RP  CT HEAD CODE STROKE WO CONTRAST  Result Date: 02/23/2021 CLINICAL DATA:  Code stroke. Initial evaluation for neuro deficit, stroke suspected. EXAM: CT HEAD WITHOUT CONTRAST TECHNIQUE: Contiguous axial images were obtained from the base of the skull through the vertex without intravenous contrast. COMPARISON:  Prior CT from 01/31/2020. FINDINGS: Brain: Streak/beam hardening artifact emanating from the left occipital calvarium noted, mildly limiting assessment. Cerebral volume within normal limits. Focal encephalomalacia at the central and left superior cerebellum, consistent with a chronic left MCA distribution infarct. Subtle evolving hypodensity noted involving the superior right cerebellum and cerebellar vermis, concerning for an acute right SCA distribution infarct (series 3, image 12, 11). No associated hemorrhage or mass effect. No other evidence for acute large vessel territory infarct. No intracranial hemorrhage. No mass lesion, mass effect or midline shift. No hydrocephalus or extra-axial fluid collection. Vascular: No hyperdense vessel. Skull: Scalp soft tissues demonstrate no acute finding. Small focal  exostosis noted at the right frontal calvarium. Calvarium intact. Sinuses/Orbits: Globes and orbital soft tissues within normal limits. Moderate left sphenoid and maxillary sinusitis. Additional scattered pneumatized secretions noted within the ethmoidal air cells. Subcentimeter osteoma noted within the right ethmoidal air cells as well. Mastoid air cells are clear. Other: None. ASPECTS [facility]([place] Stroke Program Early CT Score) - Ganglionic level infarction (caudate, lentiform nuclei, internal capsule, insula, M1-M3 cortex): 7 - Supraganglionic infarction (M4-M6 cortex): 3 Total score (0-10 with 10 being normal): 10 IMPRESSION: 1. Evolving hypodensity involving the superior right cerebellum/cerebellar vermis, concerning for an acute right SCA distribution infarct. No intracranial hemorrhage or mass effect. 2. ASPECTS is 10. 3. Underlying chronic left SCA distribution infarcts. Critical Value/emergent results were called by telephone at the time of interpretation on 02/23/2021 at 12:55 a.m. to provider Dr. Amada JupiterKirkpatrick, Who verbally acknowledged these results. Electronically Signed   By: Rise MuBenjamin  McClintock M.D.   On: 02/23/2021 01:23   CT ANGIO HEAD NECK W WO CM (CODE STROKE)  Result Date: 02/23/2021 CLINICAL DATA:  Initial evaluation for neuro deficit, stroke suspected. EXAM: CT ANGIOGRAPHY HEAD AND NECK TECHNIQUE: Multidetector CT imaging of the head and neck was performed using the standard protocol during bolus administration of intravenous contrast. Multiplanar CT image reconstructions and MIPs were obtained to evaluate the vascular anatomy. Carotid stenosis measurements (when applicable) are obtained utilizing NASCET criteria, using the distal internal carotid diameter as the denominator. CONTRAST:  75mL OMNIPAQUE IOHEXOL 350 MG/ML SOLN COMPARISON:  Prior head CT from earlier the same day. FINDINGS: CTA NECK FINDINGS Aortic arch: Visualized aortic arch normal in caliber with normal branch pattern. No stenosis  about the origin of the great vessels. Right carotid system: Right common carotid artery patent from its origin to the bifurcation without stenosis. Minimal plaque about the right carotid bulb without stenosis. Right ICA patent distally without stenosis, dissection or occlusion. Left carotid system: Left CCA patent from its origin to the bifurcation without stenosis. Minimal plaque about the left carotid bulb without significant stenosis. Left ICA patent distally without stenosis, dissection or occlusion. Vertebral  arteries: Left vertebral artery arises directly from the aortic arch. Right vertebral artery dominant. Vertebral arteries widely patent within the neck. Just prior to the skull base/dural reflection, there is distal right V3 segment (series 9, image 83), suspicious for a possible short-segment dissection. Small amount of probable intraluminal thrombus protrudes into the vascular lumen at this level. No frank raised dissection flap is seen. No more than mild stenosis at this level. The right vertebral artery is relatively normal just distally as it crosses into the cranial vault. Skeleton: No acute osseous finding. No discrete or worrisome osseous lesions. Mild cervical spondylosis noted at C5-6 without significant stenosis. Other neck: No other acute soft tissue abnormality within the neck. No mass or adenopathy. Upper chest: Visualized upper chest demonstrates no acute finding. Review of the MIP images confirms the above findings CTA HEAD FINDINGS Anterior circulation: Petrous, cavernous, and supraclinoid segments patent without stenosis or other abnormality. A1 segments widely patent. Normal anterior communicating artery complex. Anterior cerebral arteries patent to their distal aspects without stenosis. No M1 stenosis or occlusion. Normal MCA bifurcations. Distal MCA branches well perfused and symmetric. Posterior circulation: Both V4 segments patent to the vertebrobasilar junction without stenosis.  Both PICA origins patent and normal. Basilar somewhat diminutive but patent to its distal aspect without stenosis. Left superior cerebral artery patent. There is occlusion of the mid-distal right superior cerebellar artery, likely embolic (series 8, image 105). Left PCA supplied via the basilar as well as a prominent left posterior communicating artery. Predominant fetal type origin of the right PCA. Both PCAs remain widely patent to their distal aspects. Venous sinuses: Grossly patent allowing for timing the contrast bolus. Anatomic variants: Predominant fetal type origin of the right PCA. Review of the MIP images confirms the above findings IMPRESSION: 1. Focal intimal irregularity involving the distal right V3 segment, likely reflecting a short-segment dissection. No frank raised dissection flap or significant luminal narrowing. 2. 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 3. Mild for age atheromatous disease elsewhere about the major arterial vasculature of the head and neck. No other hemodynamically significant or correctable stenosis. These results were communicated to Dr. Amada Jupiter at 1:22 amon 5/10/2022by text page via the [facility] messaging system. Electronically Signed   By: Rise Mu M.D.   On: 02/23/2021 01:38    PHYSICAL EXAM  Temp:  [97.7 F (36.5 C)-98.8 F (37.1 C)] 98.8 F (37.1 C) (05/21 1249) Pulse Rate:  [86-88] 88 (05/21 1249) Resp:  [15-20] 17 (05/21 1249) BP: (110-134)/(62-83) 133/78 (05/21 1249) SpO2:  [95 %-100 %] 100 % (05/21 1249)  General - well nourished, well developed, in no apparent distress.    Ophthalmologic - fundi not visualized due to noncooperation.    Cardiovascular - regular rhythm and rate  Mental Status -  Level of arousal and orientation to time, place, and person were intact. Language including expression, naming, repetition, comprehension, reading, and writing was assessed and found  intact Fund of Knowledge was assessed and was intact.  Cranial Nerves II - XII - II - Vision intact OU. III, IV, VI - Extraocular movements intact, but with subjective binocular diplopia bilateral gaze V - Facial sensation intact bilaterally. VII - Facial movement intact bilaterally. VIII - few beats of gaze evoked nystagmus on bilateral gaze, L>R X - Palate elevates symmetrically. XI - Chin turning & shoulder shrug intact bilaterally. XII - Tongue protrusion intact.  Motor Strength - The patient's strength was normal in all extremities and pronator drift  was absent.   Motor Tone & Bulk - Muscle tone was assessed at the neck and appendages and was normal.  Bulk was normal and fasciculations were absent.   Reflexes - The patient's reflexes were normal in all extremities and she had no pathological reflexes.  Sensory - Light touch, temperature/pinprick were assessed and were normal.    Coordination - The patient had normal movements in the left hand and feet with no ataxia or dysmetria.    Slight right finger-to-nose dysmetria .  Tremor was absent.  Gait and Station - deferred   ASSESSMENT/PLAN Ms. Evana Runnels is a 60 y.o. female with history of recurrent stroke, hypertension admitted on 02/26/2021 for rehab due to right cerebellar infarct. She has been working with therapist pretty well resolved vertigo or N/V. however, developed acute onset vertigo and N/V on 03/01/21. Stroke team was called back for evaluation.   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    MRI confirmed right superior cerebellum moderate infarct and punctate dorsal midbrain infarct.    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    CT head  5/16 right cerebellar infarct with increased edema and hemorrhagic conversion  CT repeat 5/18 new right PICA large infarct, stable right SCA hemorrhagic infarct  CTA head and neck - right V3 and V4 luminal irregularity, suggestive dissection, new V3 outpouching suggestive small pseudoaneurysm  MRI brain - Large acute/subacute right PICA territory infarct and late subacute right SCA territory infarct with superimposed hemorrhagic transformation. Edema from of right cerebellar hemisphere infarcts results in leftward midline shift with mass effect on the fourth ventricle, mid brain and pons without evidence of hydrocephalus.  CT head stable right PICA and SCA infarcts, no hydrocephalus.  Loop recorder interrogation negative.    LDL 51  A1c 6.0.   echo 02/23/21 EF 60 to 65%.  No cardiac source of embolism.Marland Kitchen    Hypercoagulable work-up repeated again negative.    Was on aspirin 81 and the Brilinta 90 twice daily for 4 weeks and then Brilinta alone. Given hemorrhagic conversion, brilinta discontinued, continued on ASA 81. With new infarct and possible dissection, added plavix 5/19 after CT stable.   Ongoing aggressive stroke risk factor management  Therapy recommendations:  CIR  Disposition:  Pending   Hx of stroke  left cerebellar stroke in 01/2020 status post tPA admitted to [facility].  At that time, CTA head and neck no LVO or stenosis, MRI showed left superior cerebellar infarct.  EF normal range.  TCD bubble study negative.  LDL 138, A1c 6.0.  Hypercoagulable work-up negative.  Bilateral lower extremity DVT negative.  Discharged with DAPT for 3 weeks and then aspirin alone, as well as Lipitor 40.  After discharge she follow-up with Dr. [name] at [facility], had loop recorder placed in 02/2020.  Hypertension  Stable  Resumed amlodipine 5mg   BP goal < 160 given hemorrhagic conversion  Long-term BP goal normotensive  Hyperlipidemia  Home meds:  Lipitor 40mg   LDL 51, at goal < 70  Continue  lipitor 40  Continue statin at discharge  Other Stroke Risk Factors    Other Active Problems    Hospital day # 8 Patient appears to be illogical needs stable with no clinical signs of increased intracranial pressure despite CT  scan showing slight worsening hemorrhagic transformation.  Continue close neurological monitoring and repeat CT scan of the head later today.  If stable continue aspirin and will add Plavix in a few days  .  Patient will need diagnostic cerebral catheter angiogram at some point but given hemorrhagic transformation she is not a candidate for aggressive antiplatelet therapy at the present time hence will wait till she is finished with inpatient rehab.  If neurological can condition declines may need emergent cerebral catheter angiogram and revascularization.  If CT scan this afternoon is stable discontinue bedrest and resume ongoing therapy and rehab.  Long discussion with patient and husband at the bedside and also spoke to patient's friend who is a physician over the phone and answered questions. I had discussion with husband at the bedside, updated pt current condition, treatment plan and potential prognosis, and answered all the questions.  He expressed understanding and appreciation.  Greater than 50% time during this 35-minute visit was spent in counseling and coordination of care and discussion with care team and family and answering questions. Delia Heady, MD Stroke Neurology 03/06/2021 3:05 PM    To contact Stroke Continuity provider, please refer to [email or web address]. After hours, contact General Neurology

## 2021-03-06 NOTE — Care Plan (Signed)
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  Personally reviewed and discussed with neuroradiology  Plan: Discontinued Plavix for now, may be restarted at discretion of stroke team Continuing aspirin for now given her dissection Short interval follow-up CT scan scheduled for 2 PM Have asked nursing to confirm neuro exam is stable

## 2021-03-06 NOTE — Progress Notes (Signed)
Physical Therapy Session Note  Patient Details  Name: Judy Gutierrez MRN: [id number] Date of Birth: 04-Dec-1960  Per RN, pt on bedrest. Pt missed 60 minutes of skilled therapy. Will reattempt as schedule permits and as pt is medically able to participate.   Geraldean Walen P Treazure Nery  PT 03/06/2021, 7:34 AM

## 2021-03-06 NOTE — Progress Notes (Addendum)
Nurse spoke with Rehab MD concerning if patient able to participate with therapy. Pt is currently not able to participate in therapy at this time. Patient is on bedrest until further notice.  Cletis Media, LPN

## 2021-03-07 DIAGNOSIS — R000 Tachycardia, unspecified: Secondary | ICD-10-CM

## 2021-03-07 NOTE — Progress Notes (Signed)
PROGRESS NOTE   Subjective/Complaints: Patient seen laying in bed this morning.  She states she slept well overnight.  She has questions regarding her CT-reviewed plan with patient.  She denies complaints.  She was seen by neurology yesterday, notes reviewed- plan to challenge with Plavix again in a couple of days.  ROS: Denies CP, SOB, N/V/D  Objective:   CT HEAD WO CONTRAST  Result Date: 03/06/2021 CLINICAL DATA:  Stroke follow-up EXAM: CT HEAD WITHOUT CONTRAST TECHNIQUE: Contiguous axial images were obtained from the base of the skull through the vertex without intravenous contrast. COMPARISON:  CT head 03/16/2021 FINDINGS: Brain: Large hypodensity right inferior cerebellum compatible with PICA infarct unchanged. 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 There is mass-effect on the fourth ventricle which is effaced and mildly displaced to the left. Mild ventricular enlargement is present unchanged from earlier today but progressed since 02/23/2021 Chronic infarct left superior cerebellum unchanged. Vascular: Negative for hyperdense vessel Skull: Negative Sinuses/Orbits: Mucosal edema paranasal sinuses. Air-fluid level left sphenoid sinus. Osteoma right ethmoid sinus. Mastoid clear bilaterally. Other: None IMPRESSION: Stable CT head. 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 Electronically Signed   By: Marlan Palau M.D.   On: 03/06/2021 15:56   CT HEAD WO CONTRAST  Addendum Date: 03/06/2021   ADDENDUM REPORT: 03/06/2021 07:54 ADDENDUM: Study discussed by telephone with Dr. Iver Nestle on 03/06/2021 at 0611 hours. Electronically Signed   By: Odessa Fleming M.D.   On: 03/06/2021 07:54   Result Date: 03/06/2021 CLINICAL DATA:  60 year old female with large  right PICA infarct superimposed on subacute SCA infarct. Subsequent encounter. EXAM: CT HEAD WITHOUT CONTRAST TECHNIQUE: Contiguous axial images were obtained from the base of the skull through the vertex without intravenous contrast. COMPARISON:  Head CT 03/04/2021 and earlier. FINDINGS: Brain: Lobular hyperdense hemorrhage has progressed in the right SCA territory since 03/04/2021, with increasing regional edema and mass effect compatible with progressive hemorrhagic transformation (coronal image 46). The volume of hyperdense blood there is approximately 3 mL. Confluent right PICA infarct with cytotoxic edema which has mildly progressed since 03/03/2021. Combined, there is mild progressive basilar cistern effacement, although the cisterna magna remains patent. Fourth ventricle remains effaced. Since 03/03/2021 lateral and 3rd ventricle size is slightly increased (series 3, image 14 today versus series 3, image 17 on 03/03/2021). No supratentorial transependymal edema. Supratentorial gray-white matter differentiation preserved with no midline shift. No supratentorial hemorrhage. Vascular: Mild Calcified atherosclerosis at the skull base. Skull: Stable. Small benign right frontal bone exostosis on series 4, image 41. Sinuses/Orbits: Stable paranasal sinuses. Tympanic cavities and mastoids remain clear. Other: Stable leftward gaze deviation. Visualized scalp soft tissues are within normal limits. IMPRESSION: 1. 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 2. At the same time cytotoxic edema from the large right PICA infarct has mildly progressed. 3. Subsequent increased posterior fossa mass although the pre medullary cistern and cisterna magna remain patent. 4. There has been subtle enlargement of the lateral and 3rd ventricles since 03/03/2021,  but there is no transependymal edema. Electronically Signed: By: Odessa Fleming M.D. On:  03/06/2021 06:05   No results for input(s): WBC, HGB, HCT, PLT in the last 72 hours. Recent Labs    03/05/21 0615  NA 134*  K 3.9  CL 104  CO2 25  GLUCOSE 116*  BUN 10  CREATININE 0.63  CALCIUM 8.8*    Intake/Output Summary (Last 24 hours) at 03/07/2021 0802 Last data filed at 03/06/2021 1300 Gross per 24 hour  Intake 436 ml  Output --  Net 436 ml        Physical Exam: Vital Signs Blood pressure (!) 132/97, pulse (!) 107, temperature 98.5 F (36.9 C), temperature source Oral, resp. rate 18, height 5\' 2"  (1.575 m), weight 58.1 kg, last menstrual period 12/08/2010, SpO2 98 %.  Constitutional: No distress . Vital signs reviewed. HENT: Normocephalic.  Atraumatic. Eyes: EOMI. No discharge. Cardiovascular: No JVD.  RRR. Respiratory: Normal effort.  No stridor.  Bilateral clear to auscultation. GI: Non-distended.  BS +. Skin: Warm and dry.  Intact. Psych: Normal mood.  Normal behavior. Musc: No edema in extremities.  No tenderness in extremities. Neuro: Alert Motor: 5/5 throughout RUE dysmetria and ataxia, some improvement Dysarthria, stable  Assessment/Plan: 1. Functional deficits which require 3+ hours per day of interdisciplinary therapy in a comprehensive inpatient rehab setting.  Physiatrist is providing close team supervision and 24 hour management of active medical problems listed below.  Physiatrist and rehab team continue to assess barriers to discharge/monitor patient progress toward functional and medical goals  Care Tool:  Bathing    Body parts bathed by patient: Right arm,Right lower leg,Left arm,Chest,Abdomen,Front perineal area,Buttocks,Left upper leg,Left lower leg,Face,Right upper leg         Bathing assist Assist Level: Supervision/Verbal cueing     Upper Body Dressing/Undressing Upper body dressing   What is the patient wearing?: Hospital gown only    Upper body assist Assist Level: Supervision/Verbal cueing    Lower Body  Dressing/Undressing Lower body dressing      What is the patient wearing?: Pants     Lower body assist Assist for lower body dressing: Minimal Assistance - Patient > 75%     Toileting Toileting    Toileting assist Assist for toileting: Contact Guard/Touching assist     Transfers Chair/bed transfer  Transfers assist     Chair/bed transfer assist level: Contact Guard/Touching assist     Locomotion Ambulation   Ambulation assist      Assist level: Minimal Assistance - Patient > 75% Assistive device: Walker-rolling Max distance: 137ft   Walk 10 feet activity   Assist     Assist level: Minimal Assistance - Patient > 75% Assistive device: Walker-rolling   Walk 50 feet activity   Assist    Assist level: Minimal Assistance - Patient > 75% Assistive device: Walker-rolling    Walk 150 feet activity   Assist    Assist level: Minimal Assistance - Patient > 75% Assistive device: Walker-rolling    Walk 10 feet on uneven surface  activity   Assist Walk 10 feet on uneven surfaces activity did not occur: Safety/medical concerns         Wheelchair     Assist Will patient use wheelchair at discharge?: No             Wheelchair 50 feet with 2 turns activity    Assist            Wheelchair 150 feet activity  Assist           Medical Problem List and Plan: 1.  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 Increased edema associated with the right cerebellar and right dorsal midbrain infarcts with interval development of acute hemorrhage at the site of right cerebellar infarct. Increased resulting mass effect on the fourth ventricle without hydrocephalus.      2. Paranasal sinus mucosal thickening with  frothy secretions.  Repeat CT on 5/17 showing new right PICA infarct, repeat on 5/19 showing stability, repeat CT on 5/21 shows worsening/progression of cytotoxic edema from right PICA infarct, hemorrhagic transformation of right SCA infarct, and hydrocephalus, repeat CT later on 5/20 stable.  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  Leftward midline shift with mass-effect on fourth ventricle, but no hydrocephalus  Appreciate Neuro recs, discussed with Neuro - following closely  Updated husband.  Resume CIR 2.  Antithrombotics: -DVT/anticoagulation: Lovenox              -antiplatelet therapy: Aspirin 81 mg daily, Brilinta d/ced, Plavix initiated, but also DC'd on 5/21 due to progression of hemorrhage 3. Pain Management: Tylenol as needed             Monitor, particularly for headaches with increased exertion  Controlled on 5/21 4. Mood: Provide emotional support             -antipsychotic agents: N/A 5. Neuropsych: This patient is capable of making decisions on her own behalf. 6. Skin/Wound Care: Routine skin checks 7. Fluids/Electrolytes/Nutrition: Routine in and outs 8.  Hypertension.  Patient on Cozaar50 mg daily, Norvasc 5 mg daily prior to admission.    Norvasc 5 started on 5/16             Vitals with BMI 03/07/2021 03/06/2021 03/06/2021  Height - - -  Weight - - -  BMI - - -  Systolic 132 123 621  Diastolic 97 76 78  Pulse 107 101 88   Relatively controlled on 5/21 9. Hyperlipidemia: Lipitor 10. ABLA             Hb 11.1 on 5/16, labs ordered for tomorrow  Cont to monitor 12. Transaminitis  AST elevated, but improving on 5/20  Cont to monitor 13. Dysarthria  SLP ordered 14.  Hyponatremia  Sodium 134 on 5/20, labs ordered for tomorrow 15.  Tachycardia  Elevated this a.m., otherwise controlled  ECG ordered  LOS: 9 days A FACE TO FACE EVALUATION  WAS PERFORMED  Maryagnes Carrasco Karis Juba 03/07/2021, 8:02 AM

## 2021-03-07 NOTE — Progress Notes (Signed)
STROKE TEAM PROGRESS NOTE   SUBJECTIVE (INTERVAL HISTORY) Husband is at bedside.  Patient neurologically unchanged, still has mild diplopia and right arm incoordination but slightly improved from yesterday.   .  Repeat CT yesterday afternoon shows stable appearance of hemorrhagic transformation and mild mass-effect on fourth ventricle and slight hydrocephalus but no intraventricular extension.    Patient denies any headache mental status is clear without any drowsiness.  Brilinta is on hold and she is on aspirin alone.  Vital signs are stable.  Neurological exam is unchanged. Her CT angiograms films were personally reviewed with neuroradiologist Dr. 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 Recent Labs  Lab 03/01/21 0500 03/05/21 0615  NA 136 134*  K 4.0 3.9  CL 105 104  CO2 26 25  GLUCOSE 106* 116*  BUN 16 10  CREATININE 0.62 0.63  CALCIUM 8.7* 8.8*   Recent Labs  Lab 03/01/21 0500 03/05/21 0615  AST 56* 45*  ALT 47* 40  ALKPHOS 59 72  BILITOT 1.2 0.8  PROT 6.8 7.5  ALBUMIN 3.0* 3.4*   Recent Labs  Lab 03/01/21 0500  WBC 5.1  NEUTROABS 2.5  HGB 11.1*  HCT 33.9*  MCV 89.0  PLT 214   No results for input(s): CKTOTAL, CKMB, CKMBINDEX, TROPONINI in the last 168 hours. No results for input(s): LABPROT, INR in the last 72 hours. No results for input(s): COLORURINE, LABSPEC, PHURINE, GLUCOSEU, HGBUR, BILIRUBINUR, KETONESUR, PROTEINUR, UROBILINOGEN, NITRITE, LEUKOCYTESUR in the last 72 hours.  Invalid input(s): APPERANCEUR     Component Value Date/Time   CHOL 105 02/23/2021 0745   CHOL 191 09/09/2015 0804    TRIG 28 02/23/2021 0745   HDL 48 02/23/2021 0745   HDL 60 09/09/2015 0804   CHOLHDL 2.2 02/23/2021 0745   VLDL 6 02/23/2021 0745   LDLCALC 51 02/23/2021 0745   LDLCALC 120 (H) 09/09/2015 0804   Lab Results  Component Value Date   HGBA1C 6.0 (H) 02/23/2021      Component Value Date/Time   LABOPIA NONE DETECTED 02/23/2021 0033   COCAINSCRNUR NONE DETECTED 02/23/2021 0033   LABBENZ NONE DETECTED 02/23/2021 0033   AMPHETMU NONE DETECTED 02/23/2021 0033   THCU NONE DETECTED 02/23/2021 0033   LABBARB NONE DETECTED 02/23/2021 0033    No results for input(s): ETH in the last 168 hours.  I have personally reviewed the radiological images below and agree with the radiology interpretations.  CT ANGIO HEAD NECK W WO CM  Result Date: 03/03/2021 CLINICAL DATA:  Stroke follow-up. EXAM: CT ANGIOGRAPHY HEAD AND NECK TECHNIQUE: Multidetector CT imaging of the head and neck was performed using the standard protocol during bolus administration of intravenous contrast. Multiplanar CT image reconstructions and MIPs were obtained to evaluate the vascular anatomy. Carotid stenosis measurements (when applicable) are obtained utilizing NASCET criteria, using the distal internal carotid diameter as the denominator. CONTRAST:  69mL OMNIPAQUE IOHEXOL 350 MG/ML SOLN COMPARISON:  CT angiogram of the head and neck Feb 23, 2021. FINDINGS: CTA NECK FINDINGS Aortic arch: 4 vessel aortic arch with direct origin of the left vertebral artery from the aortic arch in. Imaged portion  shows no evidence of aneurysm or dissection. No significant stenosis of the major arch vessel origins. Right carotid system: Minimal atherosclerotic changes of the right carotid bifurcation without hemodynamically significant stenosis. Left carotid system: Mild atherosclerotic changes of the left carotid bifurcation without hemodynamically significant stenosis. Vertebral arteries: Right dominant. The non dominant left vertebral artery has normal  course and caliber from the aortic arch to the vertebrobasilar junction. Area of luminal irregularity is again seen in the V3 segment of the right vertebral artery now with mild stenosis followed by small (1-2 mm) outpouching suggesting pseudoaneurysm. There is also luminal irregularity at the V4 segment. Skeleton: Negative. Other neck: Negative. Upper chest: Negative. Review of the MIP images confirms the above findings CTA HEAD FINDINGS Anterior circulation: No significant stenosis, proximal occlusion, aneurysm, or vascular malformation. Posterior circulation: Mild luminal irregularity of the V4 segment of the right vertebral artery which involves the origin of the right PICA which appear patent. The basilar artery is small in caliber without area of focal stenosis, likely related to the presence of bilateral prominent posterior communicating arteries. The right P1/PCA segment is absent (fetal PCA). Interval recanalization of the right superior cerebellar artery. Venous sinuses: As permitted by contrast timing, patent. Anatomic variants: Right fetal PCA. Prominent left posterior communicating artery. Review of the MIP images confirms the above findings IMPRESSION: 1. Luminal irregularity in the V3 and V4 segments of the right vertebral artery is suggestive of dissection without hemodynamically significant stenosis. Area of irregularity of the origin of the right PICA which remains patent. There is a new diminutive outpouching from the right V3 segment suggestive of a small pseudoaneurysm. 2. Interval recanalization of the right superior cerebellar artery. 3. No significant findings in the anterior circulation. Electronically Signed   By: Baldemar Lenis M.D.   On: 03/03/2021 12:54   CT HEAD WO CONTRAST  Result Date: 03/06/2021 CLINICAL DATA:  Stroke follow-up EXAM: CT HEAD WITHOUT CONTRAST TECHNIQUE: Contiguous axial images were obtained from the base of the skull through the vertex without  intravenous contrast. COMPARISON:  CT head 03/16/2021 FINDINGS: Brain: Large hypodensity right inferior cerebellum compatible with PICA infarct unchanged. 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 There is mass-effect on the fourth ventricle which is effaced and mildly displaced to the left. Mild ventricular enlargement is present unchanged from earlier today but progressed since 02/23/2021 Chronic infarct left superior cerebellum unchanged. Vascular: Negative for hyperdense vessel Skull: Negative Sinuses/Orbits: Mucosal edema paranasal sinuses. Air-fluid level left sphenoid sinus. Osteoma right ethmoid sinus. Mastoid clear bilaterally. Other: None IMPRESSION: Stable CT head. 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 Electronically Signed   By: Marlan Palau M.D.   On: 03/06/2021 15:56   CT HEAD WO CONTRAST  Addendum Date: 03/06/2021   ADDENDUM REPORT: 03/06/2021 07:54 ADDENDUM: Study discussed by telephone with Dr. Iver Nestle on 03/06/2021 at 0611 hours. Electronically Signed   By: Odessa Fleming M.D.   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 Subsequent encounter. EXAM: CT HEAD WITHOUT CONTRAST TECHNIQUE: Contiguous axial images were obtained from the base of the skull through the vertex without intravenous contrast. COMPARISON:  Head CT 03/04/2021 and earlier. FINDINGS: Brain: Lobular hyperdense hemorrhage has progressed in the right SCA territory since 03/04/2021, with increasing regional edema and mass  effect compatible with progressive hemorrhagic transformation (coronal image 46). The volume of hyperdense blood there is approximately 3 mL. Confluent right PICA infarct with cytotoxic edema which has mildly  progressed since 03/03/2021. Combined, there is mild progressive basilar cistern effacement, although the cisterna magna remains patent. Fourth ventricle remains effaced. Since 03/03/2021 lateral and 3rd ventricle size is slightly increased (series 3, image 14 today versus series 3, image 17 on 03/03/2021). No supratentorial transependymal edema. Supratentorial gray-white matter differentiation preserved with no midline shift. No supratentorial hemorrhage. Vascular: Mild Calcified atherosclerosis at the skull base. Skull: Stable. Small benign right frontal bone exostosis on series 4, image 41. Sinuses/Orbits: Stable paranasal sinuses. Tympanic cavities and mastoids remain clear. Other: Stable leftward gaze deviation. Visualized scalp soft tissues are within normal limits. IMPRESSION: 1. 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 2. At the same time cytotoxic edema from the large right PICA infarct has mildly progressed. 3. Subsequent increased posterior fossa mass although the pre medullary cistern and cisterna magna remain patent. 4. There has been subtle enlargement of the lateral and 3rd ventricles since 03/03/2021, but there is no transependymal edema. Electronically Signed: By: Odessa Fleming M.D. On: 03/06/2021 06:05   CT HEAD WO CONTRAST  Result Date: 03/04/2021 CLINICAL DATA:  61 year old female with new right PICA infarct superimposed on recent right SCA infarct. EXAM: CT HEAD WITHOUT CONTRAST TECHNIQUE: Contiguous axial images were obtained from the base of the skull through the vertex without intravenous contrast. COMPARISON:  Brain MRI, CTA head and neck, CT head yesterday. FINDINGS: Brain: Supratentorial gray-white matter differentiation remains stable. And the lateral and 3rd ventricle size and configuration is stable with temporal horns remaining diminutive on series 3, image 9. Subacute right SCA infarct with  petechial hemorrhage and more acute confluent right PICA infarct with cytotoxic edema redemonstrated. Stable posterior fossa mass effect including on the 4th ventricle. Superimposed chronic left SCA cerebellar infarct. Posterior fossa gray-white matter differentiation and basilar cisterns are stable since 0645 hours yesterday. Vascular: Mild Calcified atherosclerosis at the skull base. Skull: Stable.  No acute osseous abnormality identified. Sinuses/Orbits: Stable occasional sinus mucosal thickening and bubbly opacity. Tympanic cavities and mastoids remain clear. Other: Leftward gaze deviation as before. Visualized scalp soft tissues are within normal limits. IMPRESSION: 1. Stable since yesterday. Confluent Right PICA infarct superimposed on subacute Right SCA infarct with petechial hemorrhage. Stable posterior fossa mass effect. No lateral or 3rd ventriculomegaly at this time. 2. No new intracranial abnormality. Electronically Signed   By: Odessa Fleming M.D.   On: 03/04/2021 06:08   CT HEAD WO CONTRAST  Addendum Date: 03/03/2021   ADDENDUM REPORT: 03/03/2021 08:53 ADDENDUM: Study discussed by telephone with Dr. [name] on 03/03/2021 at 0847 hours. Electronically Signed   By: Odessa Fleming M.D.   On: 03/03/2021 08:53   Result Date: 03/03/2021 CLINICAL DATA:  60 year old female with right SCA cerebellar infarct on MRI 02/23/2021. Subsequent encounter. EXAM: CT HEAD WITHOUT CONTRAST TECHNIQUE: Contiguous axial images were obtained from the base of the skull through the vertex without intravenous contrast. COMPARISON:  Head CT 03/01/2021 and earlier. FINDINGS: Brain: Right superior cerebellar artery territory and cerebellar vermis cytotoxic edema with petechial hemorrhage. But new confluent cytotoxic edema now in the right PICA territory (series 3, image 5). Subsequent increased posterior fossa mass effect, with decreased prepontine and pre medullary cisterns. No tonsillar herniation. Increased effacement of the 4th  ventricle, but lateral and 3rd ventricle  size not significantly changed at this time. Supratentorial gray and white matter signal is stable. No new intracranial hemorrhage. Vascular: Mild Calcified atherosclerosis at the skull base. No suspicious intracranial vascular hyperdensity. Skull: No acute osseous abnormality identified. Small benign right frontal bone exostosis on series 4, image 59. Sinuses/Orbits: Stable mucosal thickening and bubbly opacity in the left sphenoid, left maxillary, and posterior right ethmoid sinuses. Tympanic cavities and mastoids remain clear. Other: Leftward gaze deviation. Visualized scalp soft tissues are within normal limits. IMPRESSION: 1. Confluent acute right PICA infarct is new since 03/01/2021, superimposed on the recent right SCA infarct. Subsequent increased cytotoxic edema in the right cerebellum with increased posterior fossa mass effect including effaced 4th ventricle. But no malignant hemorrhagic transformation or ventriculomegaly at this time. 2. Supratentorial brain appears stable, negative. Electronically Signed: By: Odessa Fleming M.D. On: 03/03/2021 08:42   CT HEAD WO CONTRAST  Result Date: 03/01/2021 CLINICAL DATA:  Stroke follow-up. EXAM: CT HEAD WITHOUT CONTRAST TECHNIQUE: Contiguous axial images were obtained from the base of the skull through the vertex without intravenous contrast. COMPARISON:  CT head and MRI Feb 23, 2021. FINDINGS: Brain: Increased edema associated with the right cerebellar and right dorsal midbrain infarct. Increased resulting mass effect on the fourth ventricle without hydrocephalus. Interval development of acute hemorrhage at the site of right cerebellar infarct. No evidence of new/interval acute large vascular territory infarct. Remote left cerebellar infarct. Basal cisterns are patent. No inferior cerebellar tonsillar herniation or midline shift. No mass lesion. Vascular: No hyperdense vessel identified. Skull: No acute fracture.  Sinuses/Orbits: Mucosal thickening the left maxillary sinus, right posterior ethmoid air cell and left sphenoid sinus with frothy secretions in the sinuses. Unremarkable orbits. Other: No mastoid effusions. IMPRESSION: 1. Increased edema associated with the right cerebellar and right dorsal midbrain infarcts with interval development of acute hemorrhage at the site of right cerebellar infarct. Increased resulting mass effect on the fourth ventricle without hydrocephalus. 2. Paranasal sinus mucosal thickening with frothy secretions, as detailed above. Electronically Signed   By: Feliberto Harts MD   On: 03/01/2021 14:39   MR BRAIN WO CONTRAST  Result Date: 03/03/2021 CLINICAL DATA:  Stroke follow-up EXAM: MRI HEAD WITHOUT CONTRAST TECHNIQUE: Multiplanar, multiecho pulse sequences of the brain and surrounding structures were obtained without intravenous contrast. COMPARISON:  Head CT Mar 03, 2021. FINDINGS: Brain: Large area of restricted diffusion involving the inferior right cerebellar hemisphere consistent with acute/subacute right PICA territory infarct. Patchy T1 hyperintensity with prominent susceptibility artifact in the superior aspect of the right cerebellar hemisphere with surrounding vasogenic edema is suggestive of hemorrhagic transformation of non prior right SCA territory infarct. Edema and mass effect from these 2 areas of infarcts resulting in leftward midline shift with mass effect on the fourth ventricle which remains patent as well as mass effect on the posterior aspect of the pons and mid brain with mild periaqueductal edema and small ascending transtentorial herniation. No tonsillar herniation. No hydrocephalus. Overall, findings are similar to prior CT performed earlier today Area of encephalomalacia and gliosis with hemosiderin staining in the superior left cerebellar hemisphere consistent with chronic left SCA territory infarct. Vascular: Normal flow voids. Skull and upper cervical spine:  Normal marrow signal. Sinuses/Orbits: Mucosal thickening with bubbly secretion in the left maxillary and sphenoid sinuses. The orbits are maintained. Other: Right mastoid effusion. IMPRESSION: Large acute/subacute right PICA territory infarct and late subacute right SCA territory infarct with superimposed hemorrhagic transformation. Edema from of right cerebellar hemisphere infarcts results in leftward  midline shift with mass effect on the fourth ventricle, mid brain and pons without evidence of hydrocephalus. Findings appear stable compared to head CT performed earlier today. Electronically Signed   By: Baldemar Lenis M.D.   On: 03/03/2021 13:13   MR BRAIN WO CONTRAST  Result Date: 02/23/2021 CLINICAL DATA:  Dizziness, nausea and vomiting. EXAM: MRI HEAD WITHOUT CONTRAST TECHNIQUE: Multiplanar, multiecho pulse sequences of the brain and surrounding structures were obtained without intravenous contrast. COMPARISON:  CT studies same day FINDINGS: Brain: Diffusion imaging shows a 4-5 cm region of acute infarction within the superior cerebellum on the right. Acute infarction also within the dorsal right midbrain. Old infarction within the left superior cerebellum. Mild swelling but no acute hemorrhage in the right cerebellum. Old hemosiderin residual within the left cerebellar stroke. Cerebral hemispheres are normal. No mass, hydrocephalus or extra-axial collection. Vascular: No acute vascular finding by standard MRI. Skull and upper cervical spine: Negative Sinuses/Orbits: Mucosal inflammatory changes of the left maxillary and sphenoid sinuses. Orbits negative. Other: None IMPRESSION: 4-5 cm region of acute infarction within the superior cerebellum on the right. Punctate acute infarction in the right dorsal midbrain. Mild swelling. No evidence of acute hemorrhage. Old infarction in the left superior cerebellum with atrophy, encephalomalacia and gliosis and hemosiderin deposition. Electronically  Signed   By: Paulina Fusi M.D.   On: 02/23/2021 08:42   CUP PACEART REMOTE DEVICE CHECK  Result Date: 02/09/2021 ILR summary report received. Battery status OK. Normal device function. No new symptom, tachy, brady, or pause episodes. No new AF episodes. Monthly summary reports and ROV/PRN.  RP  CT HEAD CODE STROKE WO CONTRAST  Result Date: 02/23/2021 CLINICAL DATA:  Code stroke. Initial evaluation for neuro deficit, stroke suspected. EXAM: CT HEAD WITHOUT CONTRAST TECHNIQUE: Contiguous axial images were obtained from the base of the skull through the vertex without intravenous contrast. COMPARISON:  Prior CT from 01/31/2020. FINDINGS: Brain: Streak/beam hardening artifact emanating from the left occipital calvarium noted, mildly limiting assessment. Cerebral volume within normal limits. Focal encephalomalacia at the central and left superior cerebellum, consistent with a chronic left MCA distribution infarct. Subtle evolving hypodensity noted involving the superior right cerebellum and cerebellar vermis, concerning for an acute right SCA distribution infarct (series 3, image 12, 11). No associated hemorrhage or mass effect. No other evidence for acute large vessel territory infarct. No intracranial hemorrhage. No mass lesion, mass effect or midline shift. No hydrocephalus or extra-axial fluid collection. Vascular: No hyperdense vessel. Skull: Scalp soft tissues demonstrate no acute finding. Small focal exostosis noted at the right frontal calvarium. Calvarium intact. Sinuses/Orbits: Globes and orbital soft tissues within normal limits. Moderate left sphenoid and maxillary sinusitis. Additional scattered pneumatized secretions noted within the ethmoidal air cells. Subcentimeter osteoma noted within the right ethmoidal air cells as well. Mastoid air cells are clear. Other: None. ASPECTS [facility] Stroke Program Early CT Score) - Ganglionic level infarction (caudate, lentiform nuclei, internal capsule, insula,  M1-M3 cortex): 7 - Supraganglionic infarction (M4-M6 cortex): 3 Total score (0-10 with 10 being normal): 10 IMPRESSION: 1. Evolving hypodensity involving the superior right cerebellum/cerebellar vermis, concerning for an acute right SCA distribution infarct. No intracranial hemorrhage or mass effect. 2. ASPECTS is 10. 3. Underlying chronic left SCA distribution infarcts. Critical Value/emergent results were called by telephone at the time of interpretation on 02/23/2021 at 12:55 a.m. to provider Dr. Amada Jupiter, Who verbally acknowledged these results. Electronically Signed   By: Rise Mu M.D.   On: 02/23/2021 01:23  CT ANGIO HEAD NECK W WO CM (CODE STROKE)  Result Date: 02/23/2021 CLINICAL DATA:  Initial evaluation for neuro deficit, stroke suspected. EXAM: CT ANGIOGRAPHY HEAD AND NECK TECHNIQUE: Multidetector CT imaging of the head and neck was performed using the standard protocol during bolus administration of intravenous contrast. Multiplanar CT image reconstructions and MIPs were obtained to evaluate the vascular anatomy. Carotid stenosis measurements (when applicable) are obtained utilizing NASCET criteria, using the distal internal carotid diameter as the denominator. CONTRAST:  75mL OMNIPAQUE IOHEXOL 350 MG/ML SOLN COMPARISON:  Prior head CT from earlier the same day. FINDINGS: CTA NECK FINDINGS Aortic arch: Visualized aortic arch normal in caliber with normal branch pattern. No stenosis about the origin of the great vessels. Right carotid system: Right common carotid artery patent from its origin to the bifurcation without stenosis. Minimal plaque about the right carotid bulb without stenosis. Right ICA patent distally without stenosis, dissection or occlusion. Left carotid system: Left CCA patent from its origin to the bifurcation without stenosis. Minimal plaque about the left carotid bulb without significant stenosis. Left ICA patent distally without stenosis, dissection or occlusion.  Vertebral arteries: Left vertebral artery arises directly from the aortic arch. Right vertebral artery dominant. Vertebral arteries widely patent within the neck. Just prior to the skull base/dural reflection, there is distal right V3 segment (series 9, image 83), suspicious for a possible short-segment dissection. Small amount of probable intraluminal thrombus protrudes into the vascular lumen at this level. No frank raised dissection flap is seen. No more than mild stenosis at this level. The right vertebral artery is relatively normal just distally as it crosses into the cranial vault. Skeleton: No acute osseous finding. No discrete or worrisome osseous lesions. Mild cervical spondylosis noted at C5-6 without significant stenosis. Other neck: No other acute soft tissue abnormality within the neck. No mass or adenopathy. Upper chest: Visualized upper chest demonstrates no acute finding. Review of the MIP images confirms the above findings CTA HEAD FINDINGS Anterior circulation: Petrous, cavernous, and supraclinoid segments patent without stenosis or other abnormality. A1 segments widely patent. Normal anterior communicating artery complex. Anterior cerebral arteries patent to their distal aspects without stenosis. No M1 stenosis or occlusion. Normal MCA bifurcations. Distal MCA branches well perfused and symmetric. Posterior circulation: Both V4 segments patent to the vertebrobasilar junction without stenosis. Both PICA origins patent and normal. Basilar somewhat diminutive but patent to its distal aspect without stenosis. Left superior cerebral artery patent. There is occlusion of the mid-distal right superior cerebellar artery, likely embolic (series 8, image 105). Left PCA supplied via the basilar as well as a prominent left posterior communicating artery. Predominant fetal type origin of the right PCA. Both PCAs remain widely patent to their distal aspects. Venous sinuses: Grossly patent allowing for timing  the contrast bolus. Anatomic variants: Predominant fetal type origin of the right PCA. Review of the MIP images confirms the above findings IMPRESSION: 1. Focal intimal irregularity involving the distal right V3 segment, likely reflecting a short-segment dissection. No frank raised dissection flap or significant luminal narrowing. 2. 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 3. Mild for age atheromatous disease elsewhere about the major arterial vasculature of the head and neck. No other hemodynamically significant or correctable stenosis. These results were communicated to Dr. Amada Jupiter at 1:22 amon 5/10/2022by text page via the [facility] [facility] messaging system.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

## 2021-03-08 LAB — CBC WITH DIFFERENTIAL/PLATELET
Abs Immature Granulocytes: 0.01 10*3/uL (ref 0.00–0.07)
Basophils Absolute: 0 10*3/uL (ref 0.0–0.1)
Basophils Relative: 0 %
Eosinophils Absolute: 0.1 10*3/uL (ref 0.0–0.5)
Eosinophils Relative: 2 %
HCT: 36.8 % (ref 36.0–46.0)
Hemoglobin: 12.4 g/dL (ref 12.0–15.0)
Immature Granulocytes: 0 %
Lymphocytes Relative: 30 %
Lymphs Abs: 1.8 10*3/uL (ref 0.7–4.0)
MCH: 29.2 pg (ref 26.0–34.0)
MCHC: 33.7 g/dL (ref 30.0–36.0)
MCV: 86.8 fL (ref 80.0–100.0)
Monocytes Absolute: 0.5 10*3/uL (ref 0.1–1.0)
Monocytes Relative: 8 %
Neutro Abs: 3.6 10*3/uL (ref 1.7–7.7)
Neutrophils Relative %: 60 %
Platelets: 299 10*3/uL (ref 150–400)
RBC: 4.24 MIL/uL (ref 3.87–5.11)
RDW: 12.6 % (ref 11.5–15.5)
WBC: 6 10*3/uL (ref 4.0–10.5)
nRBC: 0 % (ref 0.0–0.2)

## 2021-03-08 LAB — BASIC METABOLIC PANEL
Anion gap: 8 (ref 5–15)
BUN: 15 mg/dL (ref 6–20)
CO2: 25 mmol/L (ref 22–32)
Calcium: 9 mg/dL (ref 8.9–10.3)
Chloride: 102 mmol/L (ref 98–111)
Creatinine, Ser: 0.58 mg/dL (ref 0.44–1.00)
GFR, Estimated: >60 mL/min (ref >60)
Glucose, Bld: 134 mg/dL — ABNORMAL HIGH (ref 70–99)
Potassium: 3.9 mmol/L (ref 3.5–5.1)
Sodium: 135 mmol/L (ref 135–145)

## 2021-03-08 NOTE — Progress Notes (Signed)
Occupational Therapy Session Note  Patient Details  Name: Judy Gutierrez MRN: [id number] Date of Birth: 1961-05-18  Today's Date: 03/08/2021 OT Individual Time: 1406-1500 OT Individual Time Calculation (min): 54 min    Short Term Goals: Week 2:  OT Short Term Goal 2 (Week 2): STGs= LTGs  Skilled Therapeutic Interventions/Progress Updates:    Pt up in wheelchair to start session with glasses in place to occlude the right eye.  Had her remove glasses for further work and evaluation on visual convergence.  She reports diplopia in both the right and left visual fields, however there is fusion noted at midline as well as in the left and right upper fields to both sides.  Had her work on Community education officer exercises with use of the [employer].  She was able to demonstrate visual fusion at medial to superior midline from distances of 2-4' but not less than that.  She was unable to fuse her vision looking down in the inferior field at midline at any distance.  Educated her to work on using her thumb as a target for convergence moving it in and out at different heights to increased occular fusion when not in therapy.  Next, had her complete transfer to the therapy mat in quadriped for RUE weight bearing.  She was able to complete transfer onto quadriped with mod assist and then work on washing the mat with the LUE while supporting herself with the RUE and min guard.  She progressed to washing the mat with the RUE as well, demonstrating some slight ataxia.  Encouraged weightbearing through the right palm while washing.  She was able to then transition to sitting where she worked with the RUE at picking up and placing different resistive clothespins from the container onto the edge of the box in front of her.  She was able to complete from yellow to blue, with increased difficulty noted from ataxia, but still able to perform with increased time.  Finished session with min assist transfer to the wheelchair from the [facility]  and return to the room.  She was left with PT in preparation for next session.    Therapy Documentation Precautions:  Precautions Precautions: Fall Precaution Comments: Diplopia, trunk/RUE/LE ataxic. Systolic BP <160 Restrictions Weight Bearing Restrictions: No  Pain: Pain Assessment Pain Scale: Faces Pain Score: 0-No pain Faces Pain Scale: No hurt ADL: See Care Tool Section for some details of mobility and selfcare  Therapy/Group: Individual Therapy  Merlyn Conley OTR/L 03/08/2021, 3:57 PM

## 2021-03-08 NOTE — Progress Notes (Signed)
PROGRESS NOTE   Subjective/Complaints: Appreciate neurology following She has no complaints this morning Working with SLP Vitals stable  ROS: denies CP, SOB, N/V/D  Objective:   CT HEAD WO CONTRAST  Result Date: 03/06/2021 CLINICAL DATA:  Stroke follow-up EXAM: CT HEAD WITHOUT CONTRAST TECHNIQUE: Contiguous axial images were obtained from the base of the skull through the vertex without intravenous contrast. COMPARISON:  CT head 03/16/2021 FINDINGS: Brain: Large hypodensity right inferior cerebellum compatible with PICA infarct unchanged. 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 There is mass-effect on the fourth ventricle which is effaced and mildly displaced to the left. Mild ventricular enlargement is present unchanged from earlier today but progressed since 02/23/2021 Chronic infarct left superior cerebellum unchanged. Vascular: Negative for hyperdense vessel Skull: Negative Sinuses/Orbits: Mucosal edema paranasal sinuses. Air-fluid level left sphenoid sinus. Osteoma right ethmoid sinus. Mastoid clear bilaterally. Other: None IMPRESSION: Stable CT head. 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 Electronically Signed   By: Marlan Palau M.D.   On: 03/06/2021 15:56   No results for input(s): WBC, HGB, HCT, PLT in the last 72 hours. No results for input(s): NA, K, CL, CO2, GLUCOSE, BUN, CREATININE, CALCIUM in the last 72 hours.  Intake/Output Summary (Last 24 hours) at 03/08/2021 1011 Last data filed at 03/08/2021 0806 Gross per 24 hour  Intake 577 ml  Output --  Net 577 ml        Physical Exam: Vital Signs Blood pressure 138/88, pulse 91, temperature 98.3 F (36.8 C), resp. rate 16, height 5\' 2"  (1.575 m), weight 58.1 kg, last  menstrual period 12/08/2010, SpO2 100 %.  Gen: no distress, normal appearing HEENT: oral mucosa pink and moist, NCAT Cardio: Reg rate Chest: normal effort, normal rate of breathing Abd: soft, non-distended Ext: no edema Psych: Normal mood.  Normal behavior. Musc: No edema in extremities.  No tenderness in extremities. Neuro: Alert Motor: 5/5 throughout RUE dysmetria and ataxia, some improvement Dysarthria, stable  Assessment/Plan: 1. Functional deficits which require 3+ hours per day of interdisciplinary therapy in a comprehensive inpatient rehab setting.  Physiatrist is providing close team supervision and 24 hour management of active medical problems listed below.  Physiatrist and rehab team continue to assess barriers to discharge/monitor patient progress toward functional and medical goals  Care Tool:  Bathing    Body parts bathed by patient: Right arm,Right lower leg,Left arm,Chest,Abdomen,Front perineal area,Buttocks,Left upper leg,Left lower leg,Face,Right upper leg         Bathing assist Assist Level: Supervision/Verbal cueing     Upper Body Dressing/Undressing Upper body dressing   What is the patient wearing?: Hospital gown only    Upper body assist Assist Level: Supervision/Verbal cueing    Lower Body Dressing/Undressing Lower body dressing      What is the patient wearing?: Pants     Lower body assist Assist for lower body dressing: Minimal Assistance - Patient > 75%     Toileting Toileting    Toileting assist Assist for toileting: Contact Guard/Touching assist     Transfers Chair/bed transfer  Transfers assist     Chair/bed transfer assist level: Contact Guard/Touching assist     Locomotion Ambulation   Ambulation assist      Assist level: Minimal Assistance - Patient > 75% Assistive device: Walker-rolling Max distance: 131ft   Walk 10 feet activity   Assist     Assist level: Minimal Assistance - Patient > 75% Assistive  device: Walker-rolling   Walk 50 feet activity   Assist    Assist level: Minimal Assistance - Patient > 75% Assistive device: Walker-rolling    Walk 150 feet activity   Assist    Assist level: Minimal Assistance - Patient > 75% Assistive device: Walker-rolling    Walk 10 feet on uneven surface  activity   Assist Walk 10 feet on uneven surfaces activity did not occur: Safety/medical concerns         Wheelchair     Assist Will patient use wheelchair at discharge?: No             Wheelchair 50 feet with 2 turns activity    Assist            Wheelchair 150 feet activity     Assist           Medical Problem List and Plan: 1.  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 Increased edema associated with the right cerebellar and right dorsal midbrain infarcts with interval development of acute hemorrhage at the site of right cerebellar infarct. Increased resulting mass effect on the fourth ventricle without hydrocephalus.      2. Paranasal sinus mucosal thickening with frothy secretions.  Repeat CT on 5/17 showing new right PICA infarct, repeat on 5/19 showing stability, repeat CT on 5/21 shows worsening/progression of cytotoxic edema from right PICA infarct, hemorrhagic transformation of right SCA infarct, and hydrocephalus, repeat CT later on 5/20 stable.  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  Leftward midline shift with mass-effect on fourth ventricle, but no hydrocephalus  Appreciate Neuro recs, discussed with Neuro - following closely  Updated husband.  Continue CIR, with worsening right vertebral  artery dissection, appreciate neuro following.  2.  Antithrombotics: -DVT/anticoagulation: Lovenox              -antiplatelet therapy: Aspirin 81 mg daily, Brilinta d/ced, Plavix initiated, but also DC'd on 5/21 due to progression of hemorrhage 3. Pain Management: Continue Tylenol as needed             Monitor, particularly for headaches with increased exertion  Controlled on 5/23 4. Mood: Provide emotional support             -antipsychotic agents: N/A 5. Neuropsych: This patient is capable of making decisions on her own behalf. 6. Skin/Wound Care: Routine skin checks 7. Fluids/Electrolytes/Nutrition: Routine in and outs 8.  Hypertension.  Patient on Cozaar50 mg daily, Norvasc 5 mg daily prior to admission.    Norvasc 5 started on 5/16, continue             Vitals with BMI 03/08/2021 03/08/2021 03/07/2021  Height - - -  Weight - - -  BMI - - -  Systolic 138  127 141  Diastolic 88 87 85  Pulse 91 93 84   Relatively controlled on 5/23 9. Hyperlipidemia: Lipitor 10. ABLA             Hb 11.1 on 5/16, labs ordered, not yet resulted  Cont to monitor 12. Transaminitis  AST elevated, but improving on 5/20  Cont to monitor 13. Dysarthria  SLP ordered 14.  Hyponatremia  Sodium 134 on 5/20, labs ordered, not yet resulted 15.  Tachycardia  ECG reviewed, no significant change in ECG from prior  LOS: 10 days A FACE TO FACE EVALUATION WAS PERFORMED  Drema Pry Rema Lievanos 03/08/2021, 10:11 AM

## 2021-03-08 NOTE — Progress Notes (Signed)
STROKE TEAM PROGRESS NOTE   SUBJECTIVE (INTERVAL HISTORY) Patient is lying comfortably in bed.  Patient`s  Neurological exam is unchanged, still has mild diplopia and right arm incoordination   .     Patient denies any headache mental status is clear without any drowsiness.  Brilinta is on hold and she is on aspirin alone.  Vital signs are stable.  She is being seen by physical therapy. OBJECTIVE Temp:  [98.3 F (36.8 C)-98.8 F (37.1 C)] 98.8 F (37.1 C) (05/23 1306) Pulse Rate:  [84-101] 90 (05/23 1306) Resp:  [16-20] 18 (05/23 1306) BP: (127-144)/(79-94) 136/79 (05/23 1306) SpO2:  [99 %-100 %] 99 % (05/23 1306)  No results for input(s): GLUCAP in the last 168 hours. Recent Labs  Lab 03/05/21 0615 03/08/21 0945  NA 134* 135  K 3.9 3.9  CL 104 102  CO2 25 25  GLUCOSE 116* 134*  BUN 10 15  CREATININE 0.63 0.58  CALCIUM 8.8* 9.0   Recent Labs  Lab 03/05/21 0615  AST 45*  ALT 40  ALKPHOS 72  BILITOT 0.8  PROT 7.5  ALBUMIN 3.4*   Recent Labs  Lab 03/08/21 0945  WBC 6.0  NEUTROABS 3.6  HGB 12.4  HCT 36.8  MCV 86.8  PLT 299   No results for input(s): CKTOTAL, CKMB, CKMBINDEX, TROPONINI in the last 168 hours. No results for input(s): LABPROT, INR in the last 72 hours. No results for input(s): COLORURINE, LABSPEC, PHURINE, GLUCOSEU, HGBUR, BILIRUBINUR, KETONESUR, PROTEINUR, UROBILINOGEN, NITRITE, LEUKOCYTESUR in the last 72 hours.  Invalid input(s): APPERANCEUR     Component Value Date/Time   CHOL 105 02/23/2021 0745   CHOL 191 09/09/2015 0804   TRIG 28 02/23/2021 0745   HDL 48 02/23/2021 0745   HDL 60 09/09/2015 0804   CHOLHDL 2.2 02/23/2021 0745   VLDL 6 02/23/2021 0745   LDLCALC 51 02/23/2021 0745   LDLCALC 120 (H) 09/09/2015 0804   Lab Results  Component Value Date   HGBA1C 6.0 (H) 02/23/2021      Component Value Date/Time   LABOPIA NONE DETECTED 02/23/2021 0033   COCAINSCRNUR NONE DETECTED 02/23/2021 0033   LABBENZ NONE DETECTED 02/23/2021 0033    AMPHETMU NONE DETECTED 02/23/2021 0033   THCU NONE DETECTED 02/23/2021 0033   LABBARB NONE DETECTED 02/23/2021 0033    No results for input(s): ETH in the last 168 hours.  I have personally reviewed the radiological images below and agree with the radiology interpretations.  CT ANGIO HEAD NECK W WO CM  Result Date: 03/03/2021 CLINICAL DATA:  Stroke follow-up. EXAM: CT ANGIOGRAPHY HEAD AND NECK TECHNIQUE: Multidetector CT imaging of the head and neck was performed using the standard protocol during bolus administration of intravenous contrast. Multiplanar CT image reconstructions and MIPs were obtained to evaluate the vascular anatomy. Carotid stenosis measurements (when applicable) are obtained utilizing NASCET criteria, using the distal internal carotid diameter as the denominator. CONTRAST:  67mL OMNIPAQUE IOHEXOL 350 MG/ML SOLN COMPARISON:  CT angiogram of the head and neck Feb 23, 2021. FINDINGS: CTA NECK FINDINGS Aortic arch: 4 vessel aortic arch with direct origin of the left vertebral artery from the aortic arch in. Imaged portion shows no evidence of aneurysm or dissection. No significant stenosis of the major arch vessel origins. Right carotid system: Minimal atherosclerotic changes of the right carotid bifurcation without hemodynamically significant stenosis. Left carotid system: Mild atherosclerotic changes of the left carotid bifurcation without hemodynamically significant stenosis. Vertebral arteries: Right dominant. The non dominant left vertebral artery  has normal course and caliber from the aortic arch to the vertebrobasilar junction. Area of luminal irregularity is again seen in the V3 segment of the right vertebral artery now with mild stenosis followed by small (1-2 mm) outpouching suggesting pseudoaneurysm. There is also luminal irregularity at the V4 segment. Skeleton: Negative. Other neck: Negative. Upper chest: Negative. Review of the MIP images confirms the above findings CTA  HEAD FINDINGS Anterior circulation: No significant stenosis, proximal occlusion, aneurysm, or vascular malformation. Posterior circulation: Mild luminal irregularity of the V4 segment of the right vertebral artery which involves the origin of the right PICA which appear patent. The basilar artery is small in caliber without area of focal stenosis, likely related to the presence of bilateral prominent posterior communicating arteries. The right P1/PCA segment is absent (fetal PCA). Interval recanalization of the right superior cerebellar artery. Venous sinuses: As permitted by contrast timing, patent. Anatomic variants: Right fetal PCA. Prominent left posterior communicating artery. Review of the MIP images confirms the above findings IMPRESSION: 1. Luminal irregularity in the V3 and V4 segments of the right vertebral artery is suggestive of dissection without hemodynamically significant stenosis. Area of irregularity of the origin of the right PICA which remains patent. There is a new diminutive outpouching from the right V3 segment suggestive of a small pseudoaneurysm. 2. Interval recanalization of the right superior cerebellar artery. 3. No significant findings in the anterior circulation. Electronically Signed   By: Baldemar Lenis M.D.   On: 03/03/2021 12:54   CT HEAD WO CONTRAST  Result Date: 03/06/2021 CLINICAL DATA:  Stroke follow-up EXAM: CT HEAD WITHOUT CONTRAST TECHNIQUE: Contiguous axial images were obtained from the base of the skull through the vertex without intravenous contrast. COMPARISON:  CT head 03/16/2021 FINDINGS: Brain: Large hypodensity right inferior cerebellum compatible with PICA infarct unchanged. 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 There is mass-effect on the fourth ventricle which is effaced and mildly displaced to the left. Mild ventricular enlargement is present unchanged from earlier today but progressed since  02/23/2021 Chronic infarct left superior cerebellum unchanged. Vascular: Negative for hyperdense vessel Skull: Negative Sinuses/Orbits: Mucosal edema paranasal sinuses. Air-fluid level left sphenoid sinus. Osteoma right ethmoid sinus. Mastoid clear bilaterally. Other: None IMPRESSION: Stable CT head. 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 Electronically Signed   By: Marlan Palau M.D.   On: 03/06/2021 15:56   CT HEAD WO CONTRAST  Addendum Date: 03/06/2021   ADDENDUM REPORT: 03/06/2021 07:54 ADDENDUM: Study discussed by telephone with Dr. Iver Nestle on 03/06/2021 at 0611 hours. Electronically Signed   By: Odessa Fleming M.D.   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 Subsequent encounter. EXAM: CT HEAD WITHOUT CONTRAST TECHNIQUE: Contiguous axial images were obtained from the base of the skull through the vertex without intravenous contrast. COMPARISON:  Head CT 03/04/2021 and earlier. FINDINGS: Brain: Lobular hyperdense hemorrhage has progressed in the right SCA territory since 03/04/2021, with increasing regional edema and mass effect compatible with progressive hemorrhagic transformation (coronal image 46). The volume of hyperdense blood there is approximately 3 mL. Confluent right PICA infarct with cytotoxic edema which has mildly progressed since 03/03/2021. Combined, there is mild progressive basilar cistern effacement, although the cisterna magna remains patent. Fourth ventricle remains effaced. Since 03/03/2021 lateral and 3rd ventricle  size is slightly increased (series 3, image 14 today versus series 3, image 17 on 03/03/2021). No supratentorial transependymal edema. Supratentorial gray-white matter differentiation preserved with no midline shift. No  supratentorial hemorrhage. Vascular: Mild Calcified atherosclerosis at the skull base. Skull: Stable. Small benign right frontal bone exostosis on series 4, image 41. Sinuses/Orbits: Stable paranasal sinuses. Tympanic cavities and mastoids remain clear. Other: Stable leftward gaze deviation. Visualized scalp soft tissues are within normal limits. IMPRESSION: 1. 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 2. At the same time cytotoxic edema from the large right PICA infarct has mildly progressed. 3. Subsequent increased posterior fossa mass although the pre medullary cistern and cisterna magna remain patent. 4. There has been subtle enlargement of the lateral and 3rd ventricles since 03/03/2021, but there is no transependymal edema. Electronically Signed: By: Odessa Fleming M.D. On: 03/06/2021 06:05   CT HEAD WO CONTRAST  Result Date: 03/04/2021 CLINICAL DATA:  60 year old female with new right PICA infarct superimposed on recent right SCA infarct. EXAM: CT HEAD WITHOUT CONTRAST TECHNIQUE: Contiguous axial images were obtained from the base of the skull through the vertex without intravenous contrast. COMPARISON:  Brain MRI, CTA head and neck, CT head yesterday. FINDINGS: Brain: Supratentorial gray-white matter differentiation remains stable. And the lateral and 3rd ventricle size and configuration is stable with temporal horns remaining diminutive on series 3, image 9. Subacute right SCA infarct with petechial hemorrhage and more acute confluent right PICA infarct with cytotoxic edema redemonstrated. Stable posterior fossa mass effect including on the 4th ventricle. Superimposed chronic left SCA cerebellar infarct. Posterior fossa gray-white matter differentiation and basilar cisterns are stable since 0645 hours yesterday. Vascular: Mild Calcified atherosclerosis at the skull base. Skull: Stable.  No acute osseous abnormality  identified. Sinuses/Orbits: Stable occasional sinus mucosal thickening and bubbly opacity. Tympanic cavities and mastoids remain clear. Other: Leftward gaze deviation as before. Visualized scalp soft tissues are within normal limits. IMPRESSION: 1. Stable since yesterday. Confluent Right PICA infarct superimposed on subacute Right SCA infarct with petechial hemorrhage. Stable posterior fossa mass effect. No lateral or 3rd ventriculomegaly at this time. 2. No new intracranial abnormality. Electronically Signed   By: Odessa Fleming M.D.   On: 03/04/2021 06:08   CT HEAD WO CONTRAST  Addendum Date: 03/03/2021   ADDENDUM REPORT: 03/03/2021 08:53 ADDENDUM: Study discussed by telephone with Dr. [name] on 03/03/2021 at 0847 hours. Electronically Signed   By: Odessa Fleming M.D.   On: 03/03/2021 08:53   Result Date: 03/03/2021 CLINICAL DATA:  60 year old female with right SCA cerebellar infarct on MRI 02/23/2021. Subsequent encounter. EXAM: CT HEAD WITHOUT CONTRAST TECHNIQUE: Contiguous axial images were obtained from the base of the skull through the vertex without intravenous contrast. COMPARISON:  Head CT 03/01/2021 and earlier. FINDINGS: Brain: Right superior cerebellar artery territory and cerebellar vermis cytotoxic edema with petechial hemorrhage. But new confluent cytotoxic edema now in the right PICA territory (series 3, image 5). Subsequent increased posterior fossa mass effect, with decreased prepontine and pre medullary cisterns. No tonsillar herniation. Increased effacement of the 4th ventricle, but lateral and 3rd ventricle size not significantly changed at this time. Supratentorial gray and white matter signal is stable. No new intracranial hemorrhage. Vascular: Mild Calcified atherosclerosis at the skull base. No suspicious intracranial vascular hyperdensity. Skull: No acute osseous abnormality identified. Small benign right frontal bone exostosis on series 4, image 59. Sinuses/Orbits: Stable mucosal thickening  and bubbly opacity  in the left sphenoid, left maxillary, and posterior right ethmoid sinuses. Tympanic cavities and mastoids remain clear. Other: Leftward gaze deviation. Visualized scalp soft tissues are within normal limits. IMPRESSION: 1. Confluent acute right PICA infarct is new since 03/01/2021, superimposed on the recent right SCA infarct. Subsequent increased cytotoxic edema in the right cerebellum with increased posterior fossa mass effect including effaced 4th ventricle. But no malignant hemorrhagic transformation or ventriculomegaly at this time. 2. Supratentorial brain appears stable, negative. Electronically Signed: By: Odessa Fleming M.D. On: 03/03/2021 08:42   CT HEAD WO CONTRAST  Result Date: 03/01/2021 CLINICAL DATA:  Stroke follow-up. EXAM: CT HEAD WITHOUT CONTRAST TECHNIQUE: Contiguous axial images were obtained from the base of the skull through the vertex without intravenous contrast. COMPARISON:  CT head and MRI Feb 23, 2021. FINDINGS: Brain: Increased edema associated with the right cerebellar and right dorsal midbrain infarct. Increased resulting mass effect on the fourth ventricle without hydrocephalus. Interval development of acute hemorrhage at the site of right cerebellar infarct. No evidence of new/interval acute large vascular territory infarct. Remote left cerebellar infarct. Basal cisterns are patent. No inferior cerebellar tonsillar herniation or midline shift. No mass lesion. Vascular: No hyperdense vessel identified. Skull: No acute fracture. Sinuses/Orbits: Mucosal thickening the left maxillary sinus, right posterior ethmoid air cell and left sphenoid sinus with frothy secretions in the sinuses. Unremarkable orbits. Other: No mastoid effusions. IMPRESSION: 1. Increased edema associated with the right cerebellar and right dorsal midbrain infarcts with interval development of acute hemorrhage at the site of right cerebellar infarct. Increased resulting mass effect on the fourth  ventricle without hydrocephalus. 2. Paranasal sinus mucosal thickening with frothy secretions, as detailed above. Electronically Signed   By: Feliberto Harts MD   On: 03/01/2021 14:39   MR BRAIN WO CONTRAST  Result Date: 03/03/2021 CLINICAL DATA:  Stroke follow-up EXAM: MRI HEAD WITHOUT CONTRAST TECHNIQUE: Multiplanar, multiecho pulse sequences of the brain and surrounding structures were obtained without intravenous contrast. COMPARISON:  Head CT Mar 03, 2021. FINDINGS: Brain: Large area of restricted diffusion involving the inferior right cerebellar hemisphere consistent with acute/subacute right PICA territory infarct. Patchy T1 hyperintensity with prominent susceptibility artifact in the superior aspect of the right cerebellar hemisphere with surrounding vasogenic edema is suggestive of hemorrhagic transformation of non prior right SCA territory infarct. Edema and mass effect from these 2 areas of infarcts resulting in leftward midline shift with mass effect on the fourth ventricle which remains patent as well as mass effect on the posterior aspect of the pons and mid brain with mild periaqueductal edema and small ascending transtentorial herniation. No tonsillar herniation. No hydrocephalus. Overall, findings are similar to prior CT performed earlier today Area of encephalomalacia and gliosis with hemosiderin staining in the superior left cerebellar hemisphere consistent with chronic left SCA territory infarct. Vascular: Normal flow voids. Skull and upper cervical spine: Normal marrow signal. Sinuses/Orbits: Mucosal thickening with bubbly secretion in the left maxillary and sphenoid sinuses. The orbits are maintained. Other: Right mastoid effusion. IMPRESSION: Large acute/subacute right PICA territory infarct and late subacute right SCA territory infarct with superimposed hemorrhagic transformation. Edema from of right cerebellar hemisphere infarcts results in leftward midline shift with mass effect on  the fourth ventricle, mid brain and pons without evidence of hydrocephalus. Findings appear stable compared to head CT performed earlier today. Electronically Signed   By: Baldemar Lenis M.D.   On: 03/03/2021 13:13   MR BRAIN WO CONTRAST  Result Date: 02/23/2021 CLINICAL DATA:  Dizziness, nausea and vomiting. EXAM: MRI HEAD WITHOUT CONTRAST TECHNIQUE: Multiplanar, multiecho pulse sequences of the brain and surrounding structures were obtained without intravenous contrast. COMPARISON:  CT studies same day FINDINGS: Brain: Diffusion imaging shows a 4-5 cm region of acute infarction within the superior cerebellum on the right. Acute infarction also within the dorsal right midbrain. Old infarction within the left superior cerebellum. Mild swelling but no acute hemorrhage in the right cerebellum. Old hemosiderin residual within the left cerebellar stroke. Cerebral hemispheres are normal. No mass, hydrocephalus or extra-axial collection. Vascular: No acute vascular finding by standard MRI. Skull and upper cervical spine: Negative Sinuses/Orbits: Mucosal inflammatory changes of the left maxillary and sphenoid sinuses. Orbits negative. Other: None IMPRESSION: 4-5 cm region of acute infarction within the superior cerebellum on the right. Punctate acute infarction in the right dorsal midbrain. Mild swelling. No evidence of acute hemorrhage. Old infarction in the left superior cerebellum with atrophy, encephalomalacia and gliosis and hemosiderin deposition. Electronically Signed   By: Paulina Fusi M.D.   On: 02/23/2021 08:42   CUP PACEART REMOTE DEVICE CHECK  Result Date: 02/09/2021 ILR summary report received. Battery status OK. Normal device function. No new symptom, tachy, brady, or pause episodes. No new AF episodes. Monthly summary reports and ROV/PRN.  RP  CT HEAD CODE STROKE WO CONTRAST  Result Date: 02/23/2021 CLINICAL DATA:  Code stroke. Initial evaluation for neuro deficit, stroke  suspected. EXAM: CT HEAD WITHOUT CONTRAST TECHNIQUE: Contiguous axial images were obtained from the base of the skull through the vertex without intravenous contrast. COMPARISON:  Prior CT from 01/31/2020. FINDINGS: Brain: Streak/beam hardening artifact emanating from the left occipital calvarium noted, mildly limiting assessment. Cerebral volume within normal limits. Focal encephalomalacia at the central and left superior cerebellum, consistent with a chronic left MCA distribution infarct. Subtle evolving hypodensity noted involving the superior right cerebellum and cerebellar vermis, concerning for an acute right SCA distribution infarct (series 3, image 12, 11). No associated hemorrhage or mass effect. No other evidence for acute large vessel territory infarct. No intracranial hemorrhage. No mass lesion, mass effect or midline shift. No hydrocephalus or extra-axial fluid collection. Vascular: No hyperdense vessel. Skull: Scalp soft tissues demonstrate no acute finding. Small focal exostosis noted at the right frontal calvarium. Calvarium intact. Sinuses/Orbits: Globes and orbital soft tissues within normal limits. Moderate left sphenoid and maxillary sinusitis. Additional scattered pneumatized secretions noted within the ethmoidal air cells. Subcentimeter osteoma noted within the right ethmoidal air cells as well. Mastoid air cells are clear. Other: None. ASPECTS [facility] Stroke Program Early CT Score) - Ganglionic level infarction (caudate, lentiform nuclei, internal capsule, insula, M1-M3 cortex): 7 - Supraganglionic infarction (M4-M6 cortex): 3 Total score (0-10 with 10 being normal): 10 IMPRESSION: 1. Evolving hypodensity involving the superior right cerebellum/cerebellar vermis, concerning for an acute right SCA distribution infarct. No intracranial hemorrhage or mass effect. 2. ASPECTS is 10. 3. Underlying chronic left SCA distribution infarcts. Critical Value/emergent results were called by telephone at  the time of interpretation on 02/23/2021 at 12:55 a.m. to provider Dr. Amada Jupiter, Who verbally acknowledged these results. Electronically Signed   By: Rise Mu M.D.   On: 02/23/2021 01:23   CT ANGIO HEAD NECK W WO CM (CODE STROKE)  Result Date: 02/23/2021 CLINICAL DATA:  Initial evaluation for neuro deficit, stroke suspected. EXAM: CT ANGIOGRAPHY HEAD AND NECK TECHNIQUE: Multidetector CT imaging of the head and neck was performed using the standard protocol during bolus administration of intravenous contrast. Multiplanar CT image reconstructions and  MIPs were obtained to evaluate the vascular anatomy. Carotid stenosis measurements (when applicable) are obtained utilizing NASCET criteria, using the distal internal carotid diameter as the denominator. CONTRAST:  28mL OMNIPAQUE IOHEXOL 350 MG/ML SOLN COMPARISON:  Prior head CT from earlier the same day. FINDINGS: CTA NECK FINDINGS Aortic arch: Visualized aortic arch normal in caliber with normal branch pattern. No stenosis about the origin of the great vessels. Right carotid system: Right common carotid artery patent from its origin to the bifurcation without stenosis. Minimal plaque about the right carotid bulb without stenosis. Right ICA patent distally without stenosis, dissection or occlusion. Left carotid system: Left CCA patent from its origin to the bifurcation without stenosis. Minimal plaque about the left carotid bulb without significant stenosis. Left ICA patent distally without stenosis, dissection or occlusion. Vertebral arteries: Left vertebral artery arises directly from the aortic arch. Right vertebral artery dominant. Vertebral arteries widely patent within the neck. Just prior to the skull base/dural reflection, there is distal right V3 segment (series 9, image 83), suspicious for a possible short-segment dissection. Small amount of probable intraluminal thrombus protrudes into the vascular lumen at this level. No frank raised  dissection flap is seen. No more than mild stenosis at this level. The right vertebral artery is relatively normal just distally as it crosses into the cranial vault. Skeleton: No acute osseous finding. No discrete or worrisome osseous lesions. Mild cervical spondylosis noted at C5-6 without significant stenosis. Other neck: No other acute soft tissue abnormality within the neck. No mass or adenopathy. Upper chest: Visualized upper chest demonstrates no acute finding. Review of the MIP images confirms the above findings CTA HEAD FINDINGS Anterior circulation: Petrous, cavernous, and supraclinoid segments patent without stenosis or other abnormality. A1 segments widely patent. Normal anterior communicating artery complex. Anterior cerebral arteries patent to their distal aspects without stenosis. No M1 stenosis or occlusion. Normal MCA bifurcations. Distal MCA branches well perfused and symmetric. Posterior circulation: Both V4 segments patent to the vertebrobasilar junction without stenosis. Both PICA origins patent and normal. Basilar somewhat diminutive but patent to its distal aspect without stenosis. Left superior cerebral artery patent. There is occlusion of the mid-distal right superior cerebellar artery, likely embolic (series 8, image 105). Left PCA supplied via the basilar as well as a prominent left posterior communicating artery. Predominant fetal type origin of the right PCA. Both PCAs remain widely patent to their distal aspects. Venous sinuses: Grossly patent allowing for timing the contrast bolus. Anatomic variants: Predominant fetal type origin of the right PCA. Review of the MIP images confirms the above findings IMPRESSION: 1. Focal intimal irregularity involving the distal right V3 segment, likely reflecting a short-segment dissection. No frank raised dissection flap or significant luminal narrowing. 2. 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 3.  Mild for age atheromatous disease elsewhere about the major arterial vasculature of the head and neck. No other hemodynamically significant or correctable stenosis. These results were communicated to Dr. Amada Jupiter at 1:22 amon 5/10/2022by text page via the [facility] messaging system. Electronically Signed   By: Rise Mu M.D.   On: 02/23/2021 01:38    PHYSICAL EXAM  Temp:  [98.3 F (36.8 C)-98.8 F (37.1 C)] 98.8 F (37.1 C) (05/23 1306) Pulse Rate:  [84-101] 90 (05/23 1306) Resp:  [16-20] 18 (05/23 1306) BP: (127-144)/(79-94) 136/79 (05/23 1306) SpO2:  [99 %-100 %] 99 % (05/23 1306)  General - well nourished, well developed, in no apparent distress.    Ophthalmologic -  fundi not visualized due to noncooperation.    Cardiovascular - regular rhythm and rate  Mental Status -  Level of arousal and orientation to time, place, and person were intact. Language including expression, naming, repetition, comprehension, reading, and writing was assessed and found intact Fund of Knowledge was assessed and was intact.  Cranial Nerves II - XII - II - Vision intact OU. III, IV, VI - Extraocular movements intact, but with subjective binocular diplopia bilateral gaze V - Facial sensation intact bilaterally. VII - Facial movement intact bilaterally. VIII - few beats of gaze evoked nystagmus on bilateral gaze, L>R X - Palate elevates symmetrically. XI - Chin turning & shoulder shrug intact bilaterally. XII - Tongue protrusion intact.  Motor Strength - The patient's strength was normal in all extremities and pronator drift was absent.   Motor Tone & Bulk - Muscle tone was assessed at the neck and appendages and was normal.  Bulk was normal and fasciculations were absent.   Reflexes - The patient's reflexes were normal in all extremities and she had no pathological reflexes.  Sensory - Light touch, temperature/pinprick were assessed and were normal.    Coordination - The  patient had normal movements in the left hand and feet with no ataxia or dysmetria.    Slight right finger-to-nose dysmetria .  Tremor was absent.  Gait and Station - deferred   ASSESSMENT/PLAN [name] is a 60 y.o. female with history of recurrent stroke, hypertension admitted on 02/26/2021 for rehab due to right cerebellar infarct. She has been working with therapist pretty well resolved vertigo or N/V. however, developed acute onset vertigo and N/V on 03/01/21. Stroke team was called back for evaluation.   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    MRI confirmed right superior cerebellum moderate infarct and punctate dorsal midbrain infarct.    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 Edema from of right cerebellar hemisphere infarcts results in leftward midline shift with mass effect on the fourth ventricle, mid brain and pons without evidence of hydrocephalus.  CT head stable right PICA and SCA infarcts, no hydrocephalus.  Loop recorder interrogation negative.    LDL 51  A1c 6.0.   echo 02/23/21 EF 60 to 65%.  No cardiac source of embolism.Marland Kitchen.    Hypercoagulable work-up repeated again negative.    Was on aspirin 81 and the Brilinta 90 twice daily for 4  weeks and then Brilinta alone. Given hemorrhagic conversion, brilinta discontinued, continued on ASA 81. With new infarct and possible dissection, added plavix 5/19 after CT stable.   Ongoing aggressive stroke risk factor management  Therapy recommendations:  CIR  Disposition:  Pending   Hx of stroke  left cerebellar stroke in 01/2020 status post tPA [facility]  At that time, CTA head and neck no LVO or stenosis, MRI showed left superior  cerebellar infarct.  EF normal range.  TCD bubble study negative.  LDL 138, A1c 6.0.  Hypercoagulable work-up negative.  Bilateral lower extremity DVT negative.  Discharged with DAPT for 3 weeks and then aspirin alone, as well as Lipitor 40.  After discharge she follow-up with Dr. [name] at [facility], had loop recorder placed in 02/2020.  Hypertension  Stable  Resumed amlodipine 5mg   BP goal < 160 given hemorrhagic conversion  Long-term BP goal normotensive  Hyperlipidemia  Home meds:  Lipitor 40mg   LDL 51, at goal < 70  Continue lipitor 40  Continue statin at discharge  Other Stroke Risk Factors    Other Active Problems    Hospital day # 10 Patient appears to be neurologically stable.  Case discussed with neuroradiologist Dr. .  Patient will need diagnostic cerebral catheter angiogram at some point but given hemorrhagic transformation she is not a candidate for aggressive antiplatelet therapy at the present time hence will wait till she is finished with inpatient rehab.  If neurological can condition declines may need emergent cerebral catheter angiogram and revascularization.    Long discussion with patient and husband over the phone and also spoke to patient's friend who is a physician over the phone and answered questions.  Discussed with Dr. I had discussion with husband at the bedside, updated pt current condition, treatment plan and potential prognosis, and answered all the questions.  He expressed understanding and  appreciation.  Greater than 50% time during this 25-minute visit was spent in counseling and coordination of care and discussion with care team and family and answering questions. , MD Stroke Neurology 03/08/2021 3:04 PM    To contact Stroke Continuity provider, please refer to Delia Heady. After hours, contact General Neurology

## 2021-03-08 NOTE — Progress Notes (Signed)
Speech Language Pathology Daily Session Note  Patient Details  Name: Anela Bensman MRN: [id number] Date of Birth: 20-May-1961  Today's Date: 03/08/2021 SLP Individual Time: 1740-8144 SLP Individual Time Calculation (min): 45 min  Short Term Goals: Week 1: SLP Short Term Goal 1 (Week 1): Pt will understand, recall and utilize compensatory strategies for dysarthria with 90% accuracy Supervision A SLP Short Term Goal 2 (Week 1): Pt will complete medication management task with 100% accuracy Supervision A SLP Short Term Goal 3 (Week 1): Pt will complete complex problem solving task for home environment with 90% accuracy Supervision A  Skilled Therapeutic Interventions: Skilled SLP intervention focused on speech intelligbility. Pt demonstrated good awareness of slurred speech in conversation with MD and self corrected unintelligible speech with use of overarticulation and segmenting words. She produced multi syllabic words with 3+syllables with 85% intelligibility at word level when given verbal model. Decreased speech intelligiblity noted in conversation when responding to questions. SLP asked for clarification x3 in 3 minute conversation. Cont with therapy per plan of care.      Pain Pain Assessment Pain Scale: Faces Faces Pain Scale: No hurt  Therapy/Group: Individual Therapy  Carlean Jews Braden Deloach 03/08/2021, 2:10 PM

## 2021-03-08 NOTE — Progress Notes (Signed)
Physical Therapy Session Note  Patient Details  Name: Judy Gutierrez MRN: [id number] Date of Birth: 03/29/1961  Today's Date: 03/08/2021 PT Individual Time: 1030-1130 + 1501-1530 PT Individual Time Calculation (min): 60 min  + 29 min  Short Term Goals: Week 2:  PT Short Term Goal 1 (Week 2): STG = LTG due to ELOS  Skilled Therapeutic Interventions/Progress Updates:     1st session: Pt greeted supine in bed at start of session, agreeable to PT. No reports of pain. Verbal consent from RN, PA, and MD regarding activity level, pt no longer on bedrest. Supine<>sit with CGA with use of bed features. Donned socks and tennis shoes with maxA for time management while she sat EOB. Pt requesting to use bathroom. Sit<stand with CGA to RW and ambulated with minA and RW to bathroom - remains quite ataxic in trunk and legs. Requires minA for standing balance and she lowers clothes in standing. Pt continent of bowel and bladder, charted in flowsheets. She was able to perform pericare without assist. Sit<>stand with minA to RW and ambulated with minA sinkside where she washed her hands in standing with CGA for balance but she leans quite heavily with hips onto sink. Worked on w/c mobility for RUE coordination training - she requires minA for propelling 37ft due to R veering because of significant RUE ataxia. W/c transport remaining distance to ortho gym. Completed repeated sit<>stand transfers with CGA and no AD from low mat table height - continues to show significant truncal ataxia. Completed car transfer with car height set to simulate that of sedan with RW and minA. NMR at [facility] system in standing with RW support - completed visual tracking tasks working on RUE coordination and [facility] with 1st digit pointing - with rotational speed in CCW/CW motions, pt was 28% accurate with a reaction time of ~2.88 seconds.  Gait training on level ground ~13ft with minA and RW - continues to demonstrate ataxic gait with narrow BOS,  mild scissoring, and L lean, cues throughout for corrections and safety awareness. She ended session supine in bed at end of session with needs in reach, bed alarm on, husband at bedside.   BP assessed during session 113/99 with dynamap.  2nd session: Handoff of care from OT at start of session with pt sitting in w/c. Pt denies pain. W/c transport for time management to main rehab gym. Focused beginning of session on functional stair training. She required minA for navigating up/down 2x12 steps (seated rest) with 1 hand rail on L - cues for stepping up with L foot and to monitor R foot placement to avoid overcrowding - ataxia impacting and anticipate need for family ed closer to [place]. Wheeled in // bars to focus on standing balance and side stepping for higher level balance training. Completed lateral stepping L<>R along // bars with minA for balance, stepping over bean bags on floor - difficulty advancing RUE along bar without cues and difficulty overcrowding feet due to ataxia. Placed 1.5# ankle weights bilaterally to improve proprioceptive feedback and completed the same task above, still requiring minA and continuing to show significant ataxia with RLE. Worked on static standing in // bars on compliant surface with CGA/minA for balance - worked on [employer] over bars to achieve COG. Wheeled back to room and completed stand<>pivot transfer with minA back to bed. Completed bed mobility with supervision and remained supine in bed at end of session with needs in reach and bed alarm on.  Therapy Documentation Precautions:  Precautions  Precautions: Fall Precaution Comments: Diplopia, trunk/RUE/LE ataxic. Systolic BP <160 Restrictions Weight Bearing Restrictions: No General:    Therapy/Group: Individual Therapy  Merridy Pascoe P Tereasa Yilmaz PT 03/08/2021, 7:37 AM

## 2021-03-08 NOTE — Plan of Care (Signed)
Problem: RH Balance Goal: LTG Patient will maintain dynamic sitting balance (PT) Description: LTG:  Patient will maintain dynamic sitting balance with assistance during mobility activities (PT) 03/08/2021 1246 by Oda Kilts, Zi Sek P, PT Flowsheets (Taken 03/08/2021 1246) LTG: Pt will maintain dynamic sitting balance during mobility activities with:: Supervision/Verbal cueing Note: Downgraded due to slower than anticipated progress and medical complications during rehab stay 03/08/2021 1244 by Oda Kilts, Yaakov Saindon P, PT Flowsheets (Taken 03/08/2021 1244) LTG: Pt will maintain dynamic sitting balance during mobility activities with:: Supervision/Verbal cueing Note: Downgraded due to slower than anticipated progress and medical complications during rehab stay Goal: LTG Patient will maintain dynamic standing balance (PT) Description: LTG:  Patient will maintain dynamic standing balance with assistance during mobility activities (PT) 03/08/2021 1246 by Oda Kilts, Rayshad Riviello P, PT Flowsheets (Taken 03/08/2021 1246) LTG: Pt will maintain dynamic standing balance during mobility activities with:: Contact Guard/Touching assist Note: Downgraded due to slower than anticipated progress and medical complications during rehab stay 03/08/2021 1244 by Oda Kilts, Alexey Rhoads P, PT Flowsheets (Taken 03/08/2021 1244) LTG: Pt will maintain dynamic standing balance during mobility activities with:: Contact Guard/Touching assist Note: Downgraded due to slower than anticipated progress and medical complications during rehab stay   Problem: RH Bed Mobility Goal: LTG Patient will perform bed mobility with assist (PT) Description: LTG: Patient will perform bed mobility with assistance, with/without cues (PT). 03/08/2021 1246 by Jejuan Scala P, PT Flowsheets (Taken 03/08/2021 1246) LTG: Pt will perform bed mobility with assistance level of: Supervision/Verbal cueing Note: Downgraded due to slower than anticipated progress  and medical complications during rehab stay 03/08/2021 1244 by Oda Kilts, Arihana Ambrocio P, PT Flowsheets (Taken 03/08/2021 1244) LTG: Pt will perform bed mobility with assistance level of: Supervision/Verbal cueing Note: Downgraded due to slower than anticipated progress and medical complications during rehab stay   Problem: RH Bed to Chair Transfers Goal: LTG Patient will perform bed/chair transfers w/assist (PT) Description: LTG: Patient will perform bed to chair transfers with assistance (PT). 03/08/2021 1246 by Keyunna Coco P, PT Flowsheets (Taken 03/08/2021 1246) LTG: Pt will perform Bed to Chair Transfers with assistance level: Contact Guard/Touching assist Note: Downgraded due to slower than anticipated progress and medical complications during rehab stay 03/08/2021 1244 by Oda Kilts, Darlys Buis P, PT Flowsheets (Taken 03/08/2021 1244) LTG: Pt will perform Bed to Chair Transfers with assistance level: Contact Guard/Touching assist Note: Downgraded due to slower than anticipated progress and medical complications during rehab stay   Problem: RH Ambulation Goal: LTG Patient will ambulate in controlled environment (PT) Description: LTG: Patient will ambulate in a controlled environment, # of feet with assistance (PT). 03/08/2021 1246 by Oda Kilts, Thelbert Gartin P, PT Flowsheets (Taken 03/08/2021 1246) LTG: Pt will ambulate in controlled environ  assist needed:: Minimal Assistance - Patient > 75% LTG: Ambulation distance in controlled environment: 150 Note: Downgraded due to slower than anticipated progress and medical complications during rehab stay 03/08/2021 1244 by Wynelle Link P, PT Flowsheets (Taken 03/08/2021 1244) LTG: Pt will ambulate in controlled environ  assist needed:: Minimal Assistance - Patient > 75% LTG: Ambulation distance in controlled environment: 150 Note: Downgraded due to slower than anticipated progress and medical complications during rehab stay Goal: LTG Patient will  ambulate in home environment (PT) Description: LTG: Patient will ambulate in home environment, # of feet with assistance (PT). 03/08/2021 1246 by Waneda Klammer P, PT Flowsheets (Taken 03/08/2021 1246) LTG: Pt will ambulate in home environ  assist needed:: Minimal Assistance - Patient > 75% Note: Downgraded due to slower than anticipated progress and  medical complications during rehab stay 03/08/2021 1244 by Wynelle Link P, PT Flowsheets (Taken 03/08/2021 1244) LTG: Pt will ambulate in home environ  assist needed:: Minimal Assistance - Patient > 75% LTG: Ambulation distance in home environment: 50 Note: Downgraded due to slower than anticipated progress and medical complications during rehab stay   Problem: RH Stairs Goal: LTG Patient will ambulate up and down stairs w/assist (PT) Description: LTG: Patient will ambulate up and down # of stairs with assistance (PT) 03/08/2021 1246 by Oda Kilts, Smt Lokey P, PT Flowsheets (Taken 03/08/2021 1246) LTG: Pt will ambulate up/down stairs assist needed:: Minimal Assistance - Patient > 75% LTG: Pt will  ambulate up and down number of stairs: 12 Note: Downgraded due to slower than anticipated progress and medical complications during rehab stay 03/08/2021 1244 by Wynelle Link P, PT Flowsheets (Taken 03/08/2021 1244) LTG: Pt will ambulate up/down stairs assist needed:: Minimal Assistance - Patient > 75% LTG: Pt will  ambulate up and down number of stairs: 12 Note: Downgraded due to slower than anticipated progress and medical complications during rehab stay

## 2021-03-08 NOTE — Progress Notes (Signed)
Occupational Therapy Session Note  Patient Details  Name: Judy Gutierrez MRN: [id number] Date of Birth: 1961/09/29  Today's Date: 03/08/2021 OT Individual Time: 0982-8675 OT Individual Time Calculation (min): 43 min    Short Term Goals: Week 1:  OT Short Term Goal 1 (Week 1): Pt will complete toileting tasks at CGA level OT Short Term Goal 1 - Progress (Week 1): Met OT Short Term Goal 2 (Week 1): Pt will don pants with CGA OT Short Term Goal 2 - Progress (Week 1): Met OT Short Term Goal 3 (Week 1): Pt will utilize appropriate visual compensation strategies during ADL routine with min cueing OT Short Term Goal 3 - Progress (Week 1): Met OT Short Term Goal 4 (Week 1): Pt will transfer to walk in shower with min A OT Short Term Goal 4 - Progress (Week 1): Met  Skilled Therapeutic Interventions/Progress Updates:    Treatment session with focus on dynamic sitting and standing balance and RUE NMR.  Pt received supine in bed agreeable to therapy session.  Pt completed bed mobility with close supervision to come to sitting EOB.  Pt able to don socks and shoes with setup and CGA for sitting balance due to trunk ataxia sitting EOB.  Completed squat pivot transfer min assist to w/c.  Engaged in table top activities in sitting and standing with focus on trunk and RUE ataxia.  Therapist providing tactile cues along trunk bilaterally with focus on providing increased cues and facilitation for trunk control.  Engaged in reaching and stacking activities in sitting and standing with RUE.  Noted increased success in closed chain tasks, educated on working in both open and closed chain to facilitate increased motor control.  Pt not easily frustrated despite frequently dropping or knocking over items.  Pt returned to room and remained upright in w/c with seat belt alarm on and all needs in reach.  Therapy Documentation Precautions:  Precautions Precautions: Fall Precaution Comments: Diplopia, trunk/RUE/LE  ataxic. Systolic BP <198 Restrictions Weight Bearing Restrictions: No General:   Vital Signs: Therapy Vitals Temp: 98.8 F (37.1 C) Pulse Rate: 90 Resp: 18 BP: 136/79 Patient Position (if appropriate): Lying Oxygen Therapy SpO2: 99 % O2 Device: Room Air Pain: Pain Assessment Pain Scale: Faces Faces Pain Scale: No hurt   Therapy/Group: Individual Therapy  Simonne Come 03/08/2021, 3:14 PM

## 2021-03-09 NOTE — Progress Notes (Signed)
PROGRESS NOTE   Subjective/Complaints: Patient seen sitting up in bed this AM, working with therapies this AM.  She has questions regarding discharge. She was seen by Neuro- notes reviewed.    ROS: Denies CP, SOB, N/V/D  Objective:   No results found. Recent Labs    03/08/21 0945  WBC 6.0  HGB 12.4  HCT 36.8  PLT 299   Recent Labs    03/08/21 0945  NA 135  K 3.9  CL 102  CO2 25  GLUCOSE 134*  BUN 15  CREATININE 0.58  CALCIUM 9.0    Intake/Output Summary (Last 24 hours) at 03/09/2021 1129 Last data filed at 03/09/2021 0730 Gross per 24 hour  Intake 300 ml  Output --  Net 300 ml        Physical Exam: Vital Signs Blood pressure 127/78, pulse 97, temperature 99.2 F (37.3 C), temperature source Oral, resp. rate 16, height 5\' 2"  (1.575 m), weight 58.1 kg, last menstrual period 12/08/2010, SpO2 98 %.  Constitutional: No distress . Vital signs reviewed. HENT: Normocephalic.  Atraumatic. Eyes: EOMI. No discharge. Cardiovascular: No JVD.  RRR. Respiratory: Normal effort.  No stridor.  Bilateral clear to auscultation. GI: Non-distended.  BS +. Skin: Warm and dry.  Intact. Psych: Normal mood.  Normal behavior. Musc: No edema in extremities.  No tenderness in extremities. Neuro: Alert Motor: 5/5 throughout RUE dysmetria and ataxia, stable LUE dysmetria improved Dysarthria, unchanged  Assessment/Plan: 1. Functional deficits which require 3+ hours per day of interdisciplinary therapy in a comprehensive inpatient rehab setting.  Physiatrist is providing close team supervision and 24 hour management of active medical problems listed below.  Physiatrist and rehab team continue to assess barriers to discharge/monitor patient progress toward functional and medical goals  Care Tool:  Bathing    Body parts bathed by patient: Right arm,Right lower leg,Left arm,Chest,Abdomen,Front perineal area,Buttocks,Left upper  leg,Left lower leg,Face,Right upper leg         Bathing assist Assist Level: Supervision/Verbal cueing     Upper Body Dressing/Undressing Upper body dressing   What is the patient wearing?: Hospital gown only    Upper body assist Assist Level: Supervision/Verbal cueing    Lower Body Dressing/Undressing Lower body dressing      What is the patient wearing?: Pants     Lower body assist Assist for lower body dressing: Minimal Assistance - Patient > 75%     Toileting Toileting    Toileting assist Assist for toileting: Contact Guard/Touching assist     Transfers Chair/bed transfer  Transfers assist     Chair/bed transfer assist level: Contact Guard/Touching assist     Locomotion Ambulation   Ambulation assist      Assist level: Minimal Assistance - Patient > 75% Assistive device: Walker-rolling Max distance: 118ft   Walk 10 feet activity   Assist     Assist level: Minimal Assistance - Patient > 75% Assistive device: Walker-rolling   Walk 50 feet activity   Assist    Assist level: Minimal Assistance - Patient > 75% Assistive device: Walker-rolling    Walk 150 feet activity   Assist    Assist level: Minimal Assistance - Patient > 75%  Assistive device: Walker-rolling    Walk 10 feet on uneven surface  activity   Assist Walk 10 feet on uneven surfaces activity did not occur: Safety/medical concerns         Wheelchair     Assist Will patient use wheelchair at discharge?: No             Wheelchair 50 feet with 2 turns activity    Assist            Wheelchair 150 feet activity     Assist           Medical Problem List and Plan: 1.  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  Increased edema associated with the right cerebellar and right dorsal midbrain infarcts with interval development of acute hemorrhage at the site of right cerebellar infarct. Increased resulting mass effect on the fourth ventricle without hydrocephalus.      2. Paranasal sinus mucosal thickening with frothy secretions.  Repeat CT on 5/17 showing new right PICA infarct, repeat on 5/19 showing stability, repeat CT on 5/21 shows worsening/progression of cytotoxic edema from right PICA infarct, hemorrhagic transformation of right SCA infarct, and hydrocephalus, repeat CT later on 5/20 stable.  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  Leftward midline shift with mass-effect on fourth ventricle, but no hydrocephalus  Appreciate Neuro recs, discussed with Neuro - following closely  May require emergent cerebral catheter angio and revascularization if decompensated   Continue CIR  2.  Antithrombotics: -DVT/anticoagulation: Lovenox              -antiplatelet therapy: Aspirin 81 mg daily, Brilinta d/ced, Plavix initiated, but also DC'd on 5/21 due to progression of hemorrhage 3. Pain Management: Continue Tylenol as needed             Monitor, particularly for headaches with increased exertion  Controlled on 5/24 4. Mood: Provide emotional support             -antipsychotic agents: N/A 5. Neuropsych: This patient is capable of making decisions on her own behalf. 6. Skin/Wound Care: Routine skin checks 7. Fluids/Electrolytes/Nutrition: Routine in and outs 8.  Hypertension.  Patient on Cozaar50 mg daily, Norvasc 5 mg daily prior to admission.    Norvasc 5 started on 5/16, continue             Vitals with BMI 03/09/2021 03/08/2021 03/08/2021  Height - - -  Weight - - -  BMI - - -  Systolic 127 147 876  Diastolic 78 81 79  Pulse 97 92 90   Relatively controlled on 5/24 9.  Hyperlipidemia: Lipitor 10. ABLA: Resolved             Hb 12.4 on 5/23  Cont to monitor 12. Transaminitis  AST elevated, but improving on 5/20  Cont to monitor 13. Dysarthria  Continue SLP 14.  Hyponatremia  Sodium 135 on 5/23 15.  Tachycardia  ECG reviewed, no significant change in ECG from prior  LOS: 11 days A FACE TO FACE EVALUATION WAS PERFORMED  Dayvion Sans Karis Juba 03/09/2021, 11:29 AM

## 2021-03-09 NOTE — Progress Notes (Signed)
Physical Therapy Session Note  Patient Details  Name: Judy Gutierrez MRN: [id number] Date of Birth: October 30, 1960  Today's Date: 03/09/2021 PT Individual Time: 1445-1530 PT Individual Time Calculation (min): 45 min   Short Term Goals: Week 2:  PT Short Term Goal 1 (Week 2): STG = LTG due to ELOS  Skilled Therapeutic Interventions/Progress Updates:    Patient in supine and requesting to shower.  Supine to sit with CGA.  Patient sit to stand with min A and ambulated with RW to bathroom with min A.  Performed toileting with CGA.  Patient assisted to shower seat and to cover IV with plastic.  Patient doffed clothing with min A.  Seated on shower stool working on sitting balance, and core strength to wash hair, wash upper body and legs sitting and min A for sit to stand using grabbar in shower to wash perineal area.  Patient ambulated to EOB with RW and min A.  Patient donned clothing at EOB with min A.  Stand step to w/c with min A and at sink to comb and dry hair with A for dryer.  Patient left at sink with NT to assist to complete hair drying.    Therapy Documentation Precautions:  Precautions Precautions: Fall Precaution Comments: Diplopia, trunk/RUE/LE ataxic. Systolic BP <160 Restrictions Weight Bearing Restrictions: No Pain: Pain Assessment Pain Score: 0-No pain   Therapy/Group: Individual Therapy  Elray Mcgregor  Sheran Lawless, PT 03/09/2021, 4:35 PM

## 2021-03-09 NOTE — Discharge Summary (Addendum)
Physician Discharge Summary  Patient ID: Judy Gutierrez MRN: [id number] DOB/AGE: 01/01/1961 60 y.o.  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 EXAM: CT ANGIOGRAPHY HEAD AND NECK TECHNIQUE: Multidetector CT imaging of the head and neck was performed using the standard protocol during bolus administration of intravenous contrast. Multiplanar CT image reconstructions and MIPs were obtained to evaluate the vascular anatomy. Carotid stenosis measurements (when applicable) are obtained utilizing NASCET criteria, using the distal internal carotid diameter as the denominator. CONTRAST:  50mL OMNIPAQUE IOHEXOL 350 MG/ML SOLN COMPARISON:  CT angiogram of the head and neck Feb 23, 2021. FINDINGS: CTA NECK FINDINGS Aortic arch: 4 vessel aortic arch with direct origin of the left vertebral artery from the aortic arch in. Imaged portion shows no evidence of aneurysm or dissection. No significant stenosis of the major arch vessel origins. Right carotid system: Minimal atherosclerotic changes of the right carotid bifurcation without hemodynamically significant stenosis. Left carotid system: Mild atherosclerotic changes of the left carotid bifurcation without hemodynamically significant stenosis. Vertebral arteries: Right dominant. The non dominant left vertebral artery has normal course and caliber from the aortic arch to the vertebrobasilar junction. Area of luminal irregularity is  again seen in the V3 segment of the right vertebral artery now with mild stenosis followed by small (1-2 mm) outpouching suggesting pseudoaneurysm. There is also luminal irregularity at the V4 segment. Skeleton: Negative. Other neck: Negative. Upper chest: Negative. Review of the MIP images confirms the above findings CTA HEAD FINDINGS Anterior circulation: No significant stenosis, proximal occlusion, aneurysm, or vascular malformation. Posterior circulation: Mild luminal irregularity of the V4 segment of the right vertebral artery which involves the origin of the right PICA which appear patent. The basilar artery is small in caliber without area of focal stenosis, likely related to the presence of bilateral prominent posterior communicating arteries. The right P1/PCA segment is absent (fetal PCA). Interval recanalization of the right superior cerebellar artery. Venous sinuses: As permitted by contrast timing, patent. Anatomic variants: Right fetal PCA. Prominent left posterior communicating artery. Review of the MIP images confirms the above findings IMPRESSION: 1. Luminal irregularity in the V3 and V4 segments of the right vertebral artery is suggestive of dissection without hemodynamically significant stenosis. Area of irregularity of the origin of the right PICA which remains patent. There is a new diminutive outpouching from the right V3 segment suggestive of a small pseudoaneurysm. 2. Interval recanalization of the right superior cerebellar artery. 3. No significant findings in the anterior circulation. Electronically Signed   By: Baldemar Lenis M.D.   On: 03/03/2021 12:54   CT HEAD WO CONTRAST  Result Date: 03/15/2021 CLINICAL DATA:  Stroke follow-up EXAM: CT HEAD WITHOUT CONTRAST TECHNIQUE: Contiguous axial images were obtained from the base of the skull through the vertex without intravenous contrast. COMPARISON:  Three days ago FINDINGS: Brain: Hemorrhagic infarct in the right cerebellum  shows decreased swelling and cerebellar distortion. Continued fourth ventricular distortion but stable unremarkable lateral ventricular volume. No evidence of acute hemorrhage or new infarct. Vascular: Negative Skull: Negative Sinuses/Orbits:  Negative IMPRESSION: The hemorrhagic right cerebellar infarction shows decreased swelling. No hydrocephalus. Electronically Signed   By: Marnee Spring M.D.   On: 03/15/2021 05:08   CT HEAD WO CONTRAST  Result Date: 03/12/2021 CLINICAL DATA:  Stroke follow-up EXAM: CT HEAD WITHOUT CONTRAST TECHNIQUE: Contiguous axial images were obtained from the base of the skull through the vertex without intravenous contrast. 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 No hemorrhage in this infarct Relatively large area of acute/subacute infarct in the right superior cerebellar artery territory with hemorrhagic transformation, unchanged. This infarct shows diffuse edema and considerable mass-effect on the fourth ventricle which is flattened and displaced to the left. There is mild obstructive hydrocephalus, slightly improved from the prior study. No new area of infarct or hemorrhage since yesterday. Vascular: Negative for hyperdense vessel Skull: Negative Sinuses/Orbits: Moderate mucosal edema left maxillary sinus unchanged. Remaining sinuses clear. 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 Electronically Signed   By: Marlan Palau M.D.   On: 03/12/2021 07:44   CT Head Wo Contrast  Result Date: 03/10/2021 CLINICAL DATA:  Mar 06, 2021. EXAM: CT HEAD WITHOUT CONTRAST TECHNIQUE: Contiguous axial images were obtained  from the base of the skull through the vertex without intravenous contrast. COMPARISON:  CT head Mar 06, 2021. FINDINGS: Brain: Large area of hypodensity involving the inferior right cerebellum, compatible with PICA territory infarct. Increased acute hemorrhage associated with the right superior cerebellar infarct, compatible with worsening hemorrhagic transformation. There is new intraventricular extension of hemorrhage into the fourth ventricle (series 3, image 9 and series 6, image 31). There is persistent mass effect on the fourth ventricle which is effaced and displaced to the left. Increased edema in the superior right cerebellum surrounding the hemorrhage. There is increasing ventriculomegaly with increased rounding of the temporal horns and outwardly convex third ventricle, compatible with worsening hydrocephalus. No supratentorial midline shift. No definite interval/new large vascular territory infarct. Vascular: No hyperdense vessel identified. Calcific atherosclerosis. Skull: No acute fracture. Sinuses/Orbits: Paranasal sinus mucosal thickening with air-fluid levels in the left maxillary and left sphenoid sinuses. Right ethmoid osteoma. Other: Clear mastoid air cells. IMPRESSION: 1. 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 Increased edema in the superior right cerebellum surrounding the hemorrhage. 2. Progressive ventriculomegaly, compatible with developing obstructive hydrocephalus. These results will be called to the ordering clinician or representative by the Radiologist Assistant, and communication documented in the PACS or [employer]. Electronically Signed   By: Feliberto Harts MD   On: 03/10/2021 07:33   CT HEAD WO CONTRAST  Result Date: 03/06/2021 CLINICAL DATA:  Stroke follow-up EXAM: CT HEAD WITHOUT CONTRAST  TECHNIQUE: Contiguous axial images were obtained from the base of the skull through the vertex without intravenous contrast. COMPARISON:  CT head 03/16/2021 FINDINGS: Brain: Large hypodensity right inferior cerebellum compatible with PICA infarct unchanged. 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 There is mass-effect on the fourth ventricle which is effaced and mildly displaced to the left. Mild ventricular enlargement is present unchanged from earlier today but progressed since 02/23/2021 Chronic infarct left superior cerebellum unchanged. Vascular: Negative for  hyperdense vessel Skull: Negative Sinuses/Orbits: Mucosal edema paranasal sinuses. Air-fluid level left sphenoid sinus. Osteoma right ethmoid sinus. Mastoid clear bilaterally. Other: None IMPRESSION: Stable CT head. 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 Electronically Signed   By: Marlan Palauharles  Clark M.D.   On: 03/06/2021 15:56   CT HEAD WO CONTRAST  Addendum Date: 03/06/2021   ADDENDUM REPORT: 03/06/2021 07:54 ADDENDUM: Study discussed by telephone with Dr. Iver NestleBhagat on 03/06/2021 at 0611 hours. Electronically Signed   By: Odessa FlemingH  Hall M.D.   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 Subsequent encounter. EXAM: CT HEAD WITHOUT CONTRAST TECHNIQUE: Contiguous axial images were obtained from the base of the skull through the vertex without intravenous contrast. COMPARISON:  Head CT 03/04/2021 and earlier. FINDINGS: Brain: Lobular hyperdense hemorrhage has progressed in the right SCA territory since 03/04/2021, with increasing regional edema and mass effect compatible with progressive hemorrhagic transformation (coronal image 46). The volume of hyperdense  blood there is approximately 3 mL. Confluent right PICA infarct with cytotoxic edema which has mildly progressed since 03/03/2021. Combined, there is mild progressive basilar cistern effacement, although the cisterna magna remains patent. Fourth ventricle remains effaced. Since 03/03/2021 lateral and 3rd ventricle size is slightly increased (series 3, image 14 today versus series 3, image 17 on 03/03/2021). No supratentorial transependymal edema. Supratentorial gray-white matter differentiation preserved with no midline shift. No supratentorial hemorrhage. Vascular: Mild Calcified atherosclerosis at the skull base. Skull: Stable. Small benign right frontal bone exostosis on series 4, image 41. Sinuses/Orbits: Stable paranasal sinuses. Tympanic cavities and mastoids remain clear. Other: Stable leftward gaze deviation. Visualized scalp soft tissues are within normal limits. IMPRESSION: 1. 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 2. At the same time cytotoxic edema from the large right PICA infarct has mildly progressed. 3. Subsequent increased posterior fossa mass although the pre medullary cistern and cisterna magna remain patent. 4. There has been subtle enlargement of the lateral and 3rd ventricles since 03/03/2021, but there is no transependymal edema. Electronically Signed: By: Odessa FlemingH  Hall M.D. On: 03/06/2021 06:05   CT HEAD WO CONTRAST  Result Date: 03/04/2021 CLINICAL DATA:  60 year old female with new right PICA infarct superimposed on recent right SCA infarct. EXAM: CT HEAD WITHOUT CONTRAST TECHNIQUE: Contiguous axial images were obtained from the base of the skull through the vertex without intravenous contrast. COMPARISON:  Brain MRI, CTA head and neck, CT head yesterday. FINDINGS: Brain: Supratentorial gray-white matter differentiation remains stable. And the lateral and 3rd ventricle size and configuration is  stable with temporal horns remaining diminutive on series 3, image 9. Subacute right SCA infarct with petechial hemorrhage and more acute confluent right PICA infarct with cytotoxic edema redemonstrated. Stable posterior fossa mass effect including on the 4th ventricle. Superimposed chronic left SCA cerebellar infarct. Posterior fossa gray-white matter differentiation and basilar cisterns are stable since 0645 hours yesterday. Vascular: Mild Calcified atherosclerosis at the skull base. Skull: Stable.  No acute osseous abnormality identified. Sinuses/Orbits: Stable occasional sinus mucosal thickening and bubbly opacity. Tympanic cavities and mastoids remain clear. Other: Leftward gaze deviation as before. Visualized scalp soft tissues are within normal limits. IMPRESSION: 1. Stable since yesterday. Confluent Right PICA infarct superimposed on subacute Right SCA infarct with petechial hemorrhage. Stable posterior fossa  mass effect. No lateral or 3rd ventriculomegaly at this time. 2. No new intracranial abnormality. Electronically Signed   By: Odessa Fleming M.D.   On: 03/04/2021 06:08   CT HEAD WO CONTRAST  Addendum Date: 03/03/2021   ADDENDUM REPORT: 03/03/2021 08:53 ADDENDUM: Study discussed by telephone with Dr. [name] on 03/03/2021 at 0847 hours. Electronically Signed   By: Odessa Fleming M.D.   On: 03/03/2021 08:53   Result Date: 03/03/2021 CLINICAL DATA:  61 year old female with right SCA cerebellar infarct on MRI 02/23/2021. Subsequent encounter. EXAM: CT HEAD WITHOUT CONTRAST TECHNIQUE: Contiguous axial images were obtained from the base of the skull through the vertex without intravenous contrast. COMPARISON:  Head CT 03/01/2021 and earlier. FINDINGS: Brain: Right superior cerebellar artery territory and cerebellar vermis cytotoxic edema with petechial hemorrhage. But new confluent cytotoxic edema now in the right PICA territory (series 3, image 5). Subsequent increased posterior fossa mass effect, with decreased  prepontine and pre medullary cisterns. No tonsillar herniation. Increased effacement of the 4th ventricle, but lateral and 3rd ventricle size not significantly changed at this time. Supratentorial gray and white matter signal is stable. No new intracranial hemorrhage. Vascular: Mild Calcified atherosclerosis at the skull base. No suspicious intracranial vascular hyperdensity. Skull: No acute osseous abnormality identified. Small benign right frontal bone exostosis on series 4, image 59. Sinuses/Orbits: Stable mucosal thickening and bubbly opacity in the left sphenoid, left maxillary, and posterior right ethmoid sinuses. Tympanic cavities and mastoids remain clear. Other: Leftward gaze deviation. Visualized scalp soft tissues are within normal limits. IMPRESSION: 1. Confluent acute right PICA infarct is new since 03/01/2021, superimposed on the recent right SCA infarct. Subsequent increased cytotoxic edema in the right cerebellum with increased posterior fossa mass effect including effaced 4th ventricle. But no malignant hemorrhagic transformation or ventriculomegaly at this time. 2. Supratentorial brain appears stable, negative. Electronically Signed: By: Odessa Fleming M.D. On: 03/03/2021 08:42   CT HEAD WO CONTRAST  Result Date: 03/01/2021 CLINICAL DATA:  Stroke follow-up. EXAM: CT HEAD WITHOUT CONTRAST TECHNIQUE: Contiguous axial images were obtained from the base of the skull through the vertex without intravenous contrast. COMPARISON:  CT head and MRI Feb 23, 2021. FINDINGS: Brain: Increased edema associated with the right cerebellar and right dorsal midbrain infarct. Increased resulting mass effect on the fourth ventricle without hydrocephalus. Interval development of acute hemorrhage at the site of right cerebellar infarct. No evidence of new/interval acute large vascular territory infarct. Remote left cerebellar infarct. Basal cisterns are patent. No inferior cerebellar tonsillar herniation or midline shift. No  mass lesion. Vascular: No hyperdense vessel identified. Skull: No acute fracture. Sinuses/Orbits: Mucosal thickening the left maxillary sinus, right posterior ethmoid air cell and left sphenoid sinus with frothy secretions in the sinuses. Unremarkable orbits. Other: No mastoid effusions. IMPRESSION: 1. Increased edema associated with the right cerebellar and right dorsal midbrain infarcts with interval development of acute hemorrhage at the site of right cerebellar infarct. Increased resulting mass effect on the fourth ventricle without hydrocephalus. 2. Paranasal sinus mucosal thickening with frothy secretions, as detailed above. Electronically Signed   By: Feliberto Harts MD   On: 03/01/2021 14:39   MR BRAIN WO CONTRAST  Result Date: 03/03/2021 CLINICAL DATA:  Stroke follow-up EXAM: MRI HEAD WITHOUT CONTRAST TECHNIQUE: Multiplanar, multiecho pulse sequences of the brain and surrounding structures were obtained without intravenous contrast. COMPARISON:  Head CT Mar 03, 2021. FINDINGS: Brain: Large area of restricted diffusion involving the inferior right cerebellar hemisphere consistent with acute/subacute right PICA  territory infarct. Patchy T1 hyperintensity with prominent susceptibility artifact in the superior aspect of the right cerebellar hemisphere with surrounding vasogenic edema is suggestive of hemorrhagic transformation of non prior right SCA territory infarct. Edema and mass effect from these 2 areas of infarcts resulting in leftward midline shift with mass effect on the fourth ventricle which remains patent as well as mass effect on the posterior aspect of the pons and mid brain with mild periaqueductal edema and small ascending transtentorial herniation. No tonsillar herniation. No hydrocephalus. Overall, findings are similar to prior CT performed earlier today Area of encephalomalacia and gliosis with hemosiderin staining in the superior left cerebellar hemisphere consistent with chronic left  SCA territory infarct. Vascular: Normal flow voids. Skull and upper cervical spine: Normal marrow signal. Sinuses/Orbits: Mucosal thickening with bubbly secretion in the left maxillary and sphenoid sinuses. The orbits are maintained. Other: Right mastoid effusion. IMPRESSION: Large acute/subacute right PICA territory infarct and late subacute right SCA territory infarct with superimposed hemorrhagic transformation. Edema from of right cerebellar hemisphere infarcts results in leftward midline shift with mass effect on the fourth ventricle, mid brain and pons without evidence of hydrocephalus. Findings appear stable compared to head CT performed earlier today. Electronically Signed   By: Baldemar Lenis M.D.   On: 03/03/2021 13:13   MR BRAIN WO CONTRAST  Result Date: 02/23/2021 CLINICAL DATA:  Dizziness, nausea and vomiting. EXAM: MRI HEAD WITHOUT CONTRAST TECHNIQUE: Multiplanar, multiecho pulse sequences of the brain and surrounding structures were obtained without intravenous contrast. COMPARISON:  CT studies same day FINDINGS: Brain: Diffusion imaging shows a 4-5 cm region of acute infarction within the superior cerebellum on the right. Acute infarction also within the dorsal right midbrain. Old infarction within the left superior cerebellum. Mild swelling but no acute hemorrhage in the right cerebellum. Old hemosiderin residual within the left cerebellar stroke. Cerebral hemispheres are normal. No mass, hydrocephalus or extra-axial collection. Vascular: No acute vascular finding by standard MRI. Skull and upper cervical spine: Negative Sinuses/Orbits: Mucosal inflammatory changes of the left maxillary and sphenoid sinuses. Orbits negative. Other: None IMPRESSION: 4-5 cm region of acute infarction within the superior cerebellum on the right. Punctate acute infarction in the right dorsal midbrain. Mild swelling. No evidence of acute hemorrhage. Old infarction in the left superior cerebellum with  atrophy, encephalomalacia and gliosis and hemosiderin deposition. Electronically Signed   By: Paulina Fusi M.D.   On: 02/23/2021 08:42   ECHOCARDIOGRAM COMPLETE  Result Date: 02/23/2021    ECHOCARDIOGRAM REPORT   Patient Name:   Judy Gutierrez Date of Exam: 02/23/2021 Medical Rec #:  [id number]   Height:       62.0 in Accession #:    [id number]  Weight:       136.9 lb Date of Birth:  1961-05-16  BSA:          1.627 m Patient Age:    59 years    BP:           135/72 mmHg Patient Gender: F           HR:           64 bpm. Exam Location:  Inpatient Procedure: 2D Echo, Cardiac Doppler and Color Doppler Indications:    Stroke  History:        Patient has prior history of Echocardiogram examinations, most                 recent 01/30/2020. Stroke; Risk Factors:Hypertension.  Sonographer:  Shirlean Kelly Referring Phys: [id number] CAROLE N HALL IMPRESSIONS  1. Left ventricular ejection fraction, by estimation, is 60 to 65%. The left ventricle has normal function. The left ventricle has no regional wall motion abnormalities. Left ventricular diastolic parameters are consistent with Grade I diastolic dysfunction (impaired relaxation).  2. Right ventricular systolic function is normal. The right ventricular size is normal.  3. Left atrial size was mildly dilated.  4. The mitral valve is normal in structure. No evidence of mitral valve regurgitation. No evidence of mitral stenosis.  5. The aortic valve is normal in structure. Aortic valve regurgitation is trivial. No aortic stenosis is present.  6. The inferior vena cava is normal in size with greater than 50% respiratory variability, suggesting right atrial pressure of 3 mmHg. Comparison(s): No significant change from prior study. Prior images reviewed side by side. FINDINGS  Left Ventricle: Left ventricular ejection fraction, by estimation, is 60 to 65%. The left ventricle has normal function. The left ventricle has no regional wall motion abnormalities. The left ventricular  internal cavity size was normal in size. There is  no left ventricular hypertrophy. Left ventricular diastolic parameters are consistent with Grade I diastolic dysfunction (impaired relaxation). Indeterminate filling pressures. Right Ventricle: The right ventricular size is normal. No increase in right ventricular wall thickness. Right ventricular systolic function is normal. Left Atrium: Left atrial size was mildly dilated. Right Atrium: Right atrial size was normal in size. Pericardium: There is no evidence of pericardial effusion. Mitral Valve: The mitral valve is normal in structure. No evidence of mitral valve regurgitation. No evidence of mitral valve stenosis. Tricuspid Valve: The tricuspid valve is normal in structure. Tricuspid valve regurgitation is trivial. No evidence of tricuspid stenosis. Aortic Valve: The aortic valve is normal in structure. Aortic valve regurgitation is trivial. No aortic stenosis is present. Aortic valve mean gradient measures 3.0 mmHg. Aortic valve peak gradient measures 5.9 mmHg. Aortic valve area, by VTI measures 2.32 cm. Pulmonic Valve: The pulmonic valve was normal in structure. Pulmonic valve regurgitation is not visualized. No evidence of pulmonic stenosis. Aorta: The aortic root is normal in size and structure. Venous: The inferior vena cava is normal in size with greater than 50% respiratory variability, suggesting right atrial pressure of 3 mmHg. IAS/Shunts: No atrial level shunt detected by color flow Doppler.  LEFT VENTRICLE PLAX 2D LVIDd:         3.90 cm  Diastology LVIDs:         2.50 cm  LV e' medial:    7.18 cm/s LV PW:         1.10 cm  LV E/e' medial:  11.9 LV IVS:        1.10 cm  LV e' lateral:   8.38 cm/s LVOT diam:     1.80 cm  LV E/e' lateral: 10.2 LV SV:         54 LV SV Index:   33 LVOT Area:     2.54 cm  RIGHT VENTRICLE             IVC RV S prime:     18.40 cm/s  IVC diam: 1.20 cm TAPSE (M-mode): 3.0 cm LEFT ATRIUM             Index       RIGHT ATRIUM            Index LA diam:        3.80 cm 2.34 cm/m  RA Area:  12.80 cm LA Vol (A2C):   28.7 ml 17.64 ml/m RA Volume:   29.20 ml  17.94 ml/m LA Vol (A4C):   34.8 ml 21.39 ml/m LA Biplane Vol: 32.4 ml 19.91 ml/m  AORTIC VALVE AV Area (Vmax):    2.20 cm AV Area (Vmean):   2.19 cm AV Area (VTI):     2.32 cm AV Vmax:           121.50 cm/s AV Vmean:          81.900 cm/s AV VTI:            0.233 m AV Peak Grad:      5.9 mmHg AV Mean Grad:      3.0 mmHg LVOT Vmax:         105.00 cm/s LVOT Vmean:        70.400 cm/s LVOT VTI:          0.212 m LVOT/AV VTI ratio: 0.91  AORTA Ao Root diam: 2.50 cm Ao Asc diam:  2.90 cm MITRAL VALVE                TRICUSPID VALVE MV Area (PHT): 3.17 cm     TR Peak grad:   20.8 mmHg MV Decel Time: 239 msec     TR Vmax:        228.00 cm/s MV E velocity: 85.10 cm/s MV A velocity: 118.00 cm/s  SHUNTS MV E/A ratio:  0.72         Systemic VTI:  0.21 m                             Systemic Diam: 1.80 cm Rachelle Hora Croitoru MD Electronically signed by Thurmon Fair MD Signature Date/Time: 02/23/2021/4:12:19 PM    Final    CT HEAD CODE STROKE WO CONTRAST  Result Date: 02/23/2021 CLINICAL DATA:  Code stroke. Initial evaluation for neuro deficit, stroke suspected. EXAM: CT HEAD WITHOUT CONTRAST TECHNIQUE: Contiguous axial images were obtained from the base of the skull through the vertex without intravenous contrast. COMPARISON:  Prior CT from 01/31/2020. FINDINGS: Brain: Streak/beam hardening artifact emanating from the left occipital calvarium noted, mildly limiting assessment. Cerebral volume within normal limits. Focal encephalomalacia at the central and left superior cerebellum, consistent with a chronic left MCA distribution infarct. Subtle evolving hypodensity noted involving the superior right cerebellum and cerebellar vermis, concerning for an acute right SCA distribution infarct (series 3, image 12, 11). No associated hemorrhage or mass effect. No other evidence for acute large vessel  territory infarct. No intracranial hemorrhage. No mass lesion, mass effect or midline shift. No hydrocephalus or extra-axial fluid collection. Vascular: No hyperdense vessel. Skull: Scalp soft tissues demonstrate no acute finding. Small focal exostosis noted at the right frontal calvarium. Calvarium intact. Sinuses/Orbits: Globes and orbital soft tissues within normal limits. Moderate left sphenoid and maxillary sinusitis. Additional scattered pneumatized secretions noted within the ethmoidal air cells. Subcentimeter osteoma noted within the right ethmoidal air cells as well. Mastoid air cells are clear. Other: None. ASPECTS [facility] Stroke Program Early CT Score) - Ganglionic level infarction (caudate, lentiform nuclei, internal capsule, insula, M1-M3 cortex): 7 - Supraganglionic infarction (M4-M6 cortex): 3 Total score (0-10 with 10 being normal): 10 IMPRESSION: 1. Evolving hypodensity involving the superior right cerebellum/cerebellar vermis, concerning for an acute right SCA distribution infarct. No intracranial hemorrhage or mass effect. 2. ASPECTS is 10. 3. Underlying chronic left SCA distribution infarcts. Critical Value/emergent results were  called by telephone at the time of interpretation on 02/23/2021 at 12:55 a.m. to provider Dr. Amada Jupiter, Who verbally acknowledged these results. Electronically Signed   By: Rise Mu M.D.   On: 02/23/2021 01:23   CT ANGIO HEAD NECK W WO CM (CODE STROKE)  Result Date: 02/23/2021 CLINICAL DATA:  Initial evaluation for neuro deficit, stroke suspected. EXAM: CT ANGIOGRAPHY HEAD AND NECK TECHNIQUE: Multidetector CT imaging of the head and neck was performed using the standard protocol during bolus administration of intravenous contrast. Multiplanar CT image reconstructions and MIPs were obtained to evaluate the vascular anatomy. Carotid stenosis measurements (when applicable) are obtained utilizing NASCET criteria, using the distal internal carotid diameter  as the denominator. CONTRAST:  78mL OMNIPAQUE IOHEXOL 350 MG/ML SOLN COMPARISON:  Prior head CT from earlier the same day. FINDINGS: CTA NECK FINDINGS Aortic arch: Visualized aortic arch normal in caliber with normal branch pattern. No stenosis about the origin of the great vessels. Right carotid system: Right common carotid artery patent from its origin to the bifurcation without stenosis. Minimal plaque about the right carotid bulb without stenosis. Right ICA patent distally without stenosis, dissection or occlusion. Left carotid system: Left CCA patent from its origin to the bifurcation without stenosis. Minimal plaque about the left carotid bulb without significant stenosis. Left ICA patent distally without stenosis, dissection or occlusion. Vertebral arteries: Left vertebral artery arises directly from the aortic arch. Right vertebral artery dominant. Vertebral arteries widely patent within the neck. Just prior to the skull base/dural reflection, there is distal right V3 segment (series 9, image 83), suspicious for a possible short-segment dissection. Small amount of probable intraluminal thrombus protrudes into the vascular lumen at this level. No frank raised dissection flap is seen. No more than mild stenosis at this level. The right vertebral artery is relatively normal just distally as it crosses into the cranial vault. Skeleton: No acute osseous finding. No discrete or worrisome osseous lesions. Mild cervical spondylosis noted at C5-6 without significant stenosis. Other neck: No other acute soft tissue abnormality within the neck. No mass or adenopathy. Upper chest: Visualized upper chest demonstrates no acute finding. Review of the MIP images confirms the above findings CTA HEAD FINDINGS Anterior circulation: Petrous, cavernous, and supraclinoid segments patent without stenosis or other abnormality. A1 segments widely patent. Normal anterior communicating artery complex. Anterior cerebral arteries patent  to their distal aspects without stenosis. No M1 stenosis or occlusion. Normal MCA bifurcations. Distal MCA branches well perfused and symmetric. Posterior circulation: Both V4 segments patent to the vertebrobasilar junction without stenosis. Both PICA origins patent and normal. Basilar somewhat diminutive but patent to its distal aspect without stenosis. Left superior cerebral artery patent. There is occlusion of the mid-distal right superior cerebellar artery, likely embolic (series 8, image 105). Left PCA supplied via the basilar as well as a prominent left posterior communicating artery. Predominant fetal type origin of the right PCA. Both PCAs remain widely patent to their distal aspects. Venous sinuses: Grossly patent allowing for timing the contrast bolus. Anatomic variants: Predominant fetal type origin of the right PCA. Review of the MIP images confirms the above findings IMPRESSION: 1. Focal intimal irregularity involving the distal right V3 segment, likely reflecting a short-segment dissection. No frank raised dissection flap or significant luminal narrowing. 2. 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 3. Mild for age atheromatous disease elsewhere about the major arterial vasculature of the head and neck. No other hemodynamically significant or correctable stenosis. These results were  communicated to Dr. Amada Jupiter at 1:22 amon 5/10/2022by text page via the [facility] messaging system. Electronically Signed   By: Rise Mu M.D.   On: 02/23/2021 01:38    Labs:  Basic Metabolic Panel: Recent Labs  Lab 03/08/21 0945 03/11/21 0500  NA 135 137  K 3.9 3.8  CL 102 102  CO2 25 27  GLUCOSE 134* 117*  BUN 15 12  CREATININE 0.58 0.65  CALCIUM 9.0 9.0    CBC: Recent Labs  Lab 03/08/21 0945 03/11/21 0500  WBC 6.0 5.3  NEUTROABS 3.6 2.4  HGB 12.4 11.9*  HCT 36.8 35.7*  MCV 86.8 86.4  PLT 299 293    CBG: No results for input(s):  GLUCAP in the last 168 hours.  Family history.  Mother with pulmonary emboli.  Father with hepatitis and diabetes.  Sister with diabetes.  Denies any breast cancer colon cancer or rectal cancer  Brief HPI:   Judy Gutierrez is a 61 y.o. 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  Patient lives with spouse independent prior to admission.  Two-level home bed and bath upstairs.  She has a sister with good support.  Presented 02/22/2021 with vertigo acute onset as well as nausea vomiting.  CT of the head showed evolving hypodensity involving the superior right cerebellar vermis/cerebellum concerning for right acute SCA distribution infarction.  No intracranial hemorrhage or mass-effect.  CT angiogram of the head showed focal intimal irregularity involving the distal right V3 segment likely reflecting a short segment dissection.  Downstream occlusion of the mid distal right superior cerebellar artery likely embolic.  MRI of the brain showed a 4-5 cm region of acute infarction within the superior cerebellum on the right.  Punctate acute infarct in the right dorsal midbrain with mild swelling.  Old infarction in the left superior cerebellum with atrophy/encephalomalacia.  Patient did not receive tPA.  Echocardiogram with ejection fraction of 60 to 65% grade 1 diastolic dysfunction.  Admission chemistries unremarkable except potassium 2.8 glucose 120 urine drug screen negative alcohol negative.  Maintained on low-dose aspirin as well as Brilinta for CVA prophylaxis.  Subcutaneous Lovenox for DVT prophylaxis.  Tolerating a regular diet.  Therapy evaluations completed due to patient's vertigo and decreased functional mobility was admitted for a comprehensive rehab program.   [facility] Course: Judy Gutierrez was admitted to rehab 02/26/2021 for inpatient therapies to consist of PT, ST and OT at least three hours five days a week. Past  admission physiatrist, therapy team and rehab RN have worked together to provide customized collaborative inpatient rehab.  Pertaining to patient's right superior cerebellum infarct with punctate acute infarct in the right dorsal midbrain hemorrhagic conversion initially on aspirin and Brilinta findings of new right PICA infarction followed by neurology services as well as right SCA cerebellar infarct with hemorrhagic conversion.  Patient initially on aspirin and Brilinta for 4 weeks then Brilinta alone given hemorrhagic conversion Brilinta discontinued, continued on aspirin 81 mg which was also later discontinued.  Loop recorder interrogation was negative.  With new infarction and possible dissection added Plavix 03/04/2021 but discontinued 03/06/2021 due to progression of hemorrhage and recommendations of outpatient cerebral angiogram.  Repeat cranial CT scan 03/15/2021 showed evolving hemorrhagic conversion with less mass-effect no hydrocephalus and it was decided to resume low-dose aspirin.  Patient was still having some mild diplopia right arm incoordination.  She had been on Lovenox for DVT prophylaxis.  Blood pressure controlled on low-dose Norvasc.  Lipitor ongoing for hyperlipidemia.   Blood pressures were monitored on TID basis and soft and monitored     Rehab course: During patient's stay in rehab weekly team conferences were held to monitor patient's progress, set goals and discuss barriers to discharge. At admission, patient required minimal guard sit to stand minimal guard stand pivot transfers minimal assist under 20 feet rolling walker minimal assist lower body bathing supervision upper body dressing supervision lower body dressing  Physical exam.  Blood pressure 146/80 pulse 75 temperature 98.3 respirations 18 oxygen saturation 99% room air Constitutional.  No acute distress HEENT Head.  Normocephalic and atraumatic Eyes.  Pupils round and reactive to light no discharge without  nystagmus Neck.  Supple nontender no JVD without thyromegaly Cardiac regular rate rhythm without extra sounds or murmur heard Abdomen.  Soft nontender positive bowel sounds without rebound Respiratory effort normal no respiratory distress without wheeze Skin.  Intact/warm and dry Musculoskeletal no edema or tenderness in extremities Neurologic.  Alert oriented makes eye contact with examiner. Motor.  Left upper extremity left lower extremity 5/5 proximal to distal Right upper extremity/right lower extremity 4+/5 proximal to distal  He/She  has had improvement in activity tolerance, balance, postural control as well as ability to compensate for deficits. He/She has had improvement in functional use RUE/LUE  and RLE/LLE as well as improvement in awareness.  Supine to sit with contact-guard assist with use of bed features.  Donned socks and tennis shoes with max assist for time management.  Sit to stand contact-guard assist and rolling walker and ambulates with minimal assist and rolling walker to the bathroom.  She was somewhat ataxic in trunk and legs.  Required minimal assist for standing balance.  She was able to perform PERI care without assistance.  Sit to stand minimal assist to rolling walker and ambulated with minimal assist sink side where she washed her hands and standing with contact-guard assist for balance.  She could increase her ambulation up to 175 feet with minimal assist rolling walker.  Follow-up ADLs where she noted some diplopia.  She was able to demonstrate visual fusion at medial to superior midline from distance of 2 to 4 feet but not less than that.  She was able to complete transfers onto bed with contact-guard.  She progressed to washing in the mat with right upper extremity as well demonstrating some slight ataxia.  She was able to fully communicate her needs.  She demonstrated good awareness with some slurred speech that was intelligible.  Full family teaching completed and  discharged to home       Disposition: Discharged to home    Diet: Vegetarian  Special Instructions: No driving smoking or alcohol  Outpatient cerebral angiogram with neurology services.  Medications at discharge 1.  Tylenol as needed 2.  Norvasc 5 mg p.o. daily 3.  Lipitor 80 mg p.o. daily 4.  MiraLAX daily hold for loose stools 5.  Aspirin 81 mg p.o. daily   30-35 minutes were spent completing discharge summary and discharge planning  Discharge Instructions     Ambulatory referral to Neurology   Complete by: As directed    An appointment is requested in approximately 4 weeks superior cerebellar infarct with punctate infarct right dorsal midbrain   Ambulatory referral to Physical Medicine Rehab   Complete by: As directed    Moderate complexity follow-up 1 to 2 weeks superior cerebellar punctate infarct right dorsal midbrain        Follow-up Information  Marcello Fennel, MD Follow up.   Specialty: Physical Medicine and Rehabilitation Why: Office to call for appointment Contact information: [address] [place] [address] [phone]                 Signed: Charlton Amor 03/15/2021, 7:08 AM Patient was seen, face-face, and physical exam performed by me on day of discharge, greater than 30 minutes of total time spent.. Please see progress note from day of discharge as well.  Maryla Morrow, MD, ABPMR

## 2021-03-09 NOTE — Progress Notes (Signed)
Speech Language Pathology Daily Session Note  Patient Details  Name: Judy Gutierrez MRN: [id number] Date of Birth: 09-26-1961  Today's Date: 03/09/2021 SLP Individual Time: 0820-0900 SLP Individual Time Calculation (min): 40 min  Short Term Goals: Week 1: SLP Short Term Goal 1 (Week 1): Pt will understand, recall and utilize compensatory strategies for dysarthria with 90% accuracy Supervision A SLP Short Term Goal 2 (Week 1): Pt will complete medication management task with 100% accuracy Supervision A SLP Short Term Goal 3 (Week 1): Pt will complete complex problem solving task for home environment with 90% accuracy Supervision A  Skilled Therapeutic Interventions:Skilled SLP intervention focused on cognition and speech intelligiblity. MD required clarification x2 during 4-5  Min conversation. She demonstrated use of segmenting words and overarticulation to increase intelligiblity. Multi syllabic words produced with 85% intelligiblity with vebal model.  Mental time calculations using therapy schedule completed with supervision A. She reported she feels her cognition is near baseline. Cont with therapy per plan of care.       Pain Pain Assessment Pain Scale: Faces Pain Score: 0-No pain Faces Pain Scale: No hurt  Therapy/Group: Individual Therapy  Carlean Jews Bayli Quesinberry 03/09/2021, 11:51 AM

## 2021-03-09 NOTE — Progress Notes (Signed)
STROKE TEAM PROGRESS NOTE   SUBJECTIVE (INTERVAL HISTORY) Patient is lying comfortably in bed.  Patient`s  neurological exam is unchanged, still has mild diplopia and right arm incoordination   .       Vital signs are stable.  Plan to repeat CT head tomorrow Magic transformation is resolved may add back Plavix OBJECTIVE Temp:  [98.3 F (36.8 C)-99.2 F (37.3 C)] 98.3 F (36.8 C) (05/24 1416) Pulse Rate:  [92-97] 94 (05/24 1416) Resp:  [16-18] 18 (05/24 1416) BP: (114-147)/(70-81) 114/70 (05/24 1416) SpO2:  [98 %-99 %] 98 % (05/24 1416)  No results for input(s): GLUCAP in the last 168 hours. Recent Labs  Lab 03/05/21 0615 03/08/21 0945  NA 134* 135  K 3.9 3.9  CL 104 102  CO2 25 25  GLUCOSE 116* 134*  BUN 10 15  CREATININE 0.63 0.58  CALCIUM 8.8* 9.0   Recent Labs  Lab 03/05/21 0615  AST 45*  ALT 40  ALKPHOS 72  BILITOT 0.8  PROT 7.5  ALBUMIN 3.4*   Recent Labs  Lab 03/08/21 0945  WBC 6.0  NEUTROABS 3.6  HGB 12.4  HCT 36.8  MCV 86.8  PLT 299   No results for input(s): CKTOTAL, CKMB, CKMBINDEX, TROPONINI in the last 168 hours. No results for input(s): LABPROT, INR in the last 72 hours. No results for input(s): COLORURINE, LABSPEC, PHURINE, GLUCOSEU, HGBUR, BILIRUBINUR, KETONESUR, PROTEINUR, UROBILINOGEN, NITRITE, LEUKOCYTESUR in the last 72 hours.  Invalid input(s): APPERANCEUR     Component Value Date/Time   CHOL 105 02/23/2021 0745   CHOL 191 09/09/2015 0804   TRIG 28 02/23/2021 0745   HDL 48 02/23/2021 0745   HDL 60 09/09/2015 0804   CHOLHDL 2.2 02/23/2021 0745   VLDL 6 02/23/2021 0745   LDLCALC 51 02/23/2021 0745   LDLCALC 120 (H) 09/09/2015 0804   Lab Results  Component Value Date   HGBA1C 6.0 (H) 02/23/2021      Component Value Date/Time   LABOPIA NONE DETECTED 02/23/2021 0033   COCAINSCRNUR NONE DETECTED 02/23/2021 0033   LABBENZ NONE DETECTED 02/23/2021 0033   AMPHETMU NONE DETECTED 02/23/2021 0033   THCU NONE DETECTED 02/23/2021 0033    LABBARB NONE DETECTED 02/23/2021 0033    No results for input(s): ETH in the last 168 hours.  I have personally reviewed the radiological images below and agree with the radiology interpretations.  CT ANGIO HEAD NECK W WO CM  Result Date: 03/03/2021 CLINICAL DATA:  Stroke follow-up. EXAM: CT ANGIOGRAPHY HEAD AND NECK TECHNIQUE: Multidetector CT imaging of the head and neck was performed using the standard protocol during bolus administration of intravenous contrast. Multiplanar CT image reconstructions and MIPs were obtained to evaluate the vascular anatomy. Carotid stenosis measurements (when applicable) are obtained utilizing NASCET criteria, using the distal internal carotid diameter as the denominator. CONTRAST:  57mL OMNIPAQUE IOHEXOL 350 MG/ML SOLN COMPARISON:  CT angiogram of the head and neck Feb 23, 2021. FINDINGS: CTA NECK FINDINGS Aortic arch: 4 vessel aortic arch with direct origin of the left vertebral artery from the aortic arch in. Imaged portion shows no evidence of aneurysm or dissection. No significant stenosis of the major arch vessel origins. Right carotid system: Minimal atherosclerotic changes of the right carotid bifurcation without hemodynamically significant stenosis. Left carotid system: Mild atherosclerotic changes of the left carotid bifurcation without hemodynamically significant stenosis. Vertebral arteries: Right dominant. The non dominant left vertebral artery has normal course and caliber from the aortic arch to the vertebrobasilar junction. Area  of luminal irregularity is again seen in the V3 segment of the right vertebral artery now with mild stenosis followed by small (1-2 mm) outpouching suggesting pseudoaneurysm. There is also luminal irregularity at the V4 segment. Skeleton: Negative. Other neck: Negative. Upper chest: Negative. Review of the MIP images confirms the above findings CTA HEAD FINDINGS Anterior circulation: No significant stenosis, proximal occlusion,  aneurysm, or vascular malformation. Posterior circulation: Mild luminal irregularity of the V4 segment of the right vertebral artery which involves the origin of the right PICA which appear patent. The basilar artery is small in caliber without area of focal stenosis, likely related to the presence of bilateral prominent posterior communicating arteries. The right P1/PCA segment is absent (fetal PCA). Interval recanalization of the right superior cerebellar artery. Venous sinuses: As permitted by contrast timing, patent. Anatomic variants: Right fetal PCA. Prominent left posterior communicating artery. Review of the MIP images confirms the above findings IMPRESSION: 1. Luminal irregularity in the V3 and V4 segments of the right vertebral artery is suggestive of dissection without hemodynamically significant stenosis. Area of irregularity of the origin of the right PICA which remains patent. There is a new diminutive outpouching from the right V3 segment suggestive of a small pseudoaneurysm. 2. Interval recanalization of the right superior cerebellar artery. 3. No significant findings in the anterior circulation. Electronically Signed   By: Baldemar Lenis M.D.   On: 03/03/2021 12:54   CT HEAD WO CONTRAST  Result Date: 03/06/2021 CLINICAL DATA:  Stroke follow-up EXAM: CT HEAD WITHOUT CONTRAST TECHNIQUE: Contiguous axial images were obtained from the base of the skull through the vertex without intravenous contrast. COMPARISON:  CT head 03/16/2021 FINDINGS: Brain: Large hypodensity right inferior cerebellum compatible with PICA infarct unchanged. 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 There is mass-effect on the fourth ventricle which is effaced and mildly displaced to the left. Mild ventricular enlargement is present unchanged from earlier today but progressed since 02/23/2021 Chronic infarct left superior cerebellum unchanged. Vascular:  Negative for hyperdense vessel Skull: Negative Sinuses/Orbits: Mucosal edema paranasal sinuses. Air-fluid level left sphenoid sinus. Osteoma right ethmoid sinus. Mastoid clear bilaterally. Other: None IMPRESSION: Stable CT head. 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 Electronically Signed   By: Marlan Palau M.D.   On: 03/06/2021 15:56   CT HEAD WO CONTRAST  Addendum Date: 03/06/2021   ADDENDUM REPORT: 03/06/2021 07:54 ADDENDUM: Study discussed by telephone with Dr. Iver Nestle on 03/06/2021 at 0611 hours. Electronically Signed   By: Odessa Fleming M.D.   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 Subsequent encounter. EXAM: CT HEAD WITHOUT CONTRAST TECHNIQUE: Contiguous axial images were obtained from the base of the skull through the vertex without intravenous contrast. COMPARISON:  Head CT 03/04/2021 and earlier. FINDINGS: Brain: Lobular hyperdense hemorrhage has progressed in the right SCA territory since 03/04/2021, with increasing regional edema and mass effect compatible with progressive hemorrhagic transformation (coronal image 46). The volume of hyperdense blood there is approximately 3 mL. Confluent right PICA infarct with cytotoxic edema which has mildly progressed since 03/03/2021. Combined, there is mild progressive basilar cistern effacement, although the cisterna magna remains patent. Fourth ventricle remains effaced. Since 03/03/2021 lateral and 3rd ventricle size is slightly increased (series 3, image 14 today versus series 3, image 17  on 03/03/2021). No supratentorial transependymal edema. Supratentorial gray-white matter differentiation preserved with no midline shift. No supratentorial hemorrhage. Vascular: Mild Calcified atherosclerosis at the  skull base. Skull: Stable. Small benign right frontal bone exostosis on series 4, image 41. Sinuses/Orbits: Stable paranasal sinuses. Tympanic cavities and mastoids remain clear. Other: Stable leftward gaze deviation. Visualized scalp soft tissues are within normal limits. IMPRESSION: 1. 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 2. At the same time cytotoxic edema from the large right PICA infarct has mildly progressed. 3. Subsequent increased posterior fossa mass although the pre medullary cistern and cisterna magna remain patent. 4. There has been subtle enlargement of the lateral and 3rd ventricles since 03/03/2021, but there is no transependymal edema. Electronically Signed: By: Odessa Fleming M.D. On: 03/06/2021 06:05   CT HEAD WO CONTRAST  Result Date: 03/04/2021 CLINICAL DATA:  60 year old female with new right PICA infarct superimposed on recent right SCA infarct. EXAM: CT HEAD WITHOUT CONTRAST TECHNIQUE: Contiguous axial images were obtained from the base of the skull through the vertex without intravenous contrast. COMPARISON:  Brain MRI, CTA head and neck, CT head yesterday. FINDINGS: Brain: Supratentorial gray-white matter differentiation remains stable. And the lateral and 3rd ventricle size and configuration is stable with temporal horns remaining diminutive on series 3, image 9. Subacute right SCA infarct with petechial hemorrhage and more acute confluent right PICA infarct with cytotoxic edema redemonstrated. Stable posterior fossa mass effect including on the 4th ventricle. Superimposed chronic left SCA cerebellar infarct. Posterior fossa gray-white matter differentiation and basilar cisterns are stable since 0645 hours yesterday. Vascular: Mild Calcified atherosclerosis at the skull base. Skull: Stable.  No acute osseous abnormality identified. Sinuses/Orbits: Stable occasional sinus mucosal thickening and  bubbly opacity. Tympanic cavities and mastoids remain clear. Other: Leftward gaze deviation as before. Visualized scalp soft tissues are within normal limits. IMPRESSION: 1. Stable since yesterday. Confluent Right PICA infarct superimposed on subacute Right SCA infarct with petechial hemorrhage. Stable posterior fossa mass effect. No lateral or 3rd ventriculomegaly at this time. 2. No new intracranial abnormality. Electronically Signed   By: Odessa Fleming M.D.   On: 03/04/2021 06:08   CT HEAD WO CONTRAST  Addendum Date: 03/03/2021   ADDENDUM REPORT: 03/03/2021 08:53 ADDENDUM: Study discussed by telephone with Dr. [name] on 03/03/2021 at 0847 hours. Electronically Signed   By: Odessa Fleming M.D.   On: 03/03/2021 08:53   Result Date: 03/03/2021 CLINICAL DATA:  60 year old female with right SCA cerebellar infarct on MRI 02/23/2021. Subsequent encounter. EXAM: CT HEAD WITHOUT CONTRAST TECHNIQUE: Contiguous axial images were obtained from the base of the skull through the vertex without intravenous contrast. COMPARISON:  Head CT 03/01/2021 and earlier. FINDINGS: Brain: Right superior cerebellar artery territory and cerebellar vermis cytotoxic edema with petechial hemorrhage. But new confluent cytotoxic edema now in the right PICA territory (series 3, image 5). Subsequent increased posterior fossa mass effect, with decreased prepontine and pre medullary cisterns. No tonsillar herniation. Increased effacement of the 4th ventricle, but lateral and 3rd ventricle size not significantly changed at this time. Supratentorial gray and white matter signal is stable. No new intracranial hemorrhage. Vascular: Mild Calcified atherosclerosis at the skull base. No suspicious intracranial vascular hyperdensity. Skull: No acute osseous abnormality identified. Small benign right frontal bone exostosis on series 4, image 59. Sinuses/Orbits: Stable mucosal thickening and bubbly opacity in the left sphenoid, left maxillary, and posterior right  ethmoid sinuses. Tympanic cavities and  mastoids remain clear. Other: Leftward gaze deviation. Visualized scalp soft tissues are within normal limits. IMPRESSION: 1. Confluent acute right PICA infarct is new since 03/01/2021, superimposed on the recent right SCA infarct. Subsequent increased cytotoxic edema in the right cerebellum with increased posterior fossa mass effect including effaced 4th ventricle. But no malignant hemorrhagic transformation or ventriculomegaly at this time. 2. Supratentorial brain appears stable, negative. Electronically Signed: By: Odessa Fleming M.D. On: 03/03/2021 08:42   CT HEAD WO CONTRAST  Result Date: 03/01/2021 CLINICAL DATA:  Stroke follow-up. EXAM: CT HEAD WITHOUT CONTRAST TECHNIQUE: Contiguous axial images were obtained from the base of the skull through the vertex without intravenous contrast. COMPARISON:  CT head and MRI Feb 23, 2021. FINDINGS: Brain: Increased edema associated with the right cerebellar and right dorsal midbrain infarct. Increased resulting mass effect on the fourth ventricle without hydrocephalus. Interval development of acute hemorrhage at the site of right cerebellar infarct. No evidence of new/interval acute large vascular territory infarct. Remote left cerebellar infarct. Basal cisterns are patent. No inferior cerebellar tonsillar herniation or midline shift. No mass lesion. Vascular: No hyperdense vessel identified. Skull: No acute fracture. Sinuses/Orbits: Mucosal thickening the left maxillary sinus, right posterior ethmoid air cell and left sphenoid sinus with frothy secretions in the sinuses. Unremarkable orbits. Other: No mastoid effusions. IMPRESSION: 1. Increased edema associated with the right cerebellar and right dorsal midbrain infarcts with interval development of acute hemorrhage at the site of right cerebellar infarct. Increased resulting mass effect on the fourth ventricle without hydrocephalus. 2. Paranasal sinus mucosal thickening with frothy  secretions, as detailed above. Electronically Signed   By: Feliberto Harts MD   On: 03/01/2021 14:39   MR BRAIN WO CONTRAST  Result Date: 03/03/2021 CLINICAL DATA:  Stroke follow-up EXAM: MRI HEAD WITHOUT CONTRAST TECHNIQUE: Multiplanar, multiecho pulse sequences of the brain and surrounding structures were obtained without intravenous contrast. COMPARISON:  Head CT Mar 03, 2021. FINDINGS: Brain: Large area of restricted diffusion involving the inferior right cerebellar hemisphere consistent with acute/subacute right PICA territory infarct. Patchy T1 hyperintensity with prominent susceptibility artifact in the superior aspect of the right cerebellar hemisphere with surrounding vasogenic edema is suggestive of hemorrhagic transformation of non prior right SCA territory infarct. Edema and mass effect from these 2 areas of infarcts resulting in leftward midline shift with mass effect on the fourth ventricle which remains patent as well as mass effect on the posterior aspect of the pons and mid brain with mild periaqueductal edema and small ascending transtentorial herniation. No tonsillar herniation. No hydrocephalus. Overall, findings are similar to prior CT performed earlier today Area of encephalomalacia and gliosis with hemosiderin staining in the superior left cerebellar hemisphere consistent with chronic left SCA territory infarct. Vascular: Normal flow voids. Skull and upper cervical spine: Normal marrow signal. Sinuses/Orbits: Mucosal thickening with bubbly secretion in the left maxillary and sphenoid sinuses. The orbits are maintained. Other: Right mastoid effusion. IMPRESSION: Large acute/subacute right PICA territory infarct and late subacute right SCA territory infarct with superimposed hemorrhagic transformation. Edema from of right cerebellar hemisphere infarcts results in leftward midline shift with mass effect on the fourth ventricle, mid brain and pons without evidence of hydrocephalus. Findings  appear stable compared to head CT performed earlier today. Electronically Signed   By: Baldemar Lenis M.D.   On: 03/03/2021 13:13   MR BRAIN WO CONTRAST  Result Date: 02/23/2021 CLINICAL DATA:  Dizziness, nausea and vomiting. EXAM: MRI HEAD WITHOUT CONTRAST TECHNIQUE: Multiplanar, multiecho pulse sequences  of the brain and surrounding structures were obtained without intravenous contrast. COMPARISON:  CT studies same day FINDINGS: Brain: Diffusion imaging shows a 4-5 cm region of acute infarction within the superior cerebellum on the right. Acute infarction also within the dorsal right midbrain. Old infarction within the left superior cerebellum. Mild swelling but no acute hemorrhage in the right cerebellum. Old hemosiderin residual within the left cerebellar stroke. Cerebral hemispheres are normal. No mass, hydrocephalus or extra-axial collection. Vascular: No acute vascular finding by standard MRI. Skull and upper cervical spine: Negative Sinuses/Orbits: Mucosal inflammatory changes of the left maxillary and sphenoid sinuses. Orbits negative. Other: None IMPRESSION: 4-5 cm region of acute infarction within the superior cerebellum on the right. Punctate acute infarction in the right dorsal midbrain. Mild swelling. No evidence of acute hemorrhage. Old infarction in the left superior cerebellum with atrophy, encephalomalacia and gliosis and hemosiderin deposition. Electronically Signed   By: Paulina FusiMark  Shogry M.D.   On: 02/23/2021 08:42   ECHOCARDIOGRAM COMPLETE  Result Date: 02/23/2021    ECHOCARDIOGRAM REPORT   Patient Name:   Judy Gutierrez Date of Exam: 02/23/2021 Medical Rec #:  [id number]   Height:       62.0 in Accession #:    [id number]  Weight:       136.9 lb Date of Birth:  1961/04/26  BSA:          1.627 m Patient Age:    59 years    BP:           135/72 mmHg Patient Gender: F           HR:           64 bpm. Exam Location:  Inpatient Procedure: 2D Echo, Cardiac Doppler and Color Doppler  Indications:    Stroke  History:        Patient has prior history of Echocardiogram examinations, most                 recent 01/30/2020. Stroke; Risk Factors:Hypertension.  Sonographer:    Shirlean KellyJohn Mendel Brown Referring Phys: [id number] CAROLE N HALL IMPRESSIONS  1. Left ventricular ejection fraction, by estimation, is 60 to 65%. The left ventricle has normal function. The left ventricle has no regional wall motion abnormalities. Left ventricular diastolic parameters are consistent with Grade I diastolic dysfunction (impaired relaxation).  2. Right ventricular systolic function is normal. The right ventricular size is normal.  3. Left atrial size was mildly dilated.  4. The mitral valve is normal in structure. No evidence of mitral valve regurgitation. No evidence of mitral stenosis.  5. The aortic valve is normal in structure. Aortic valve regurgitation is trivial. No aortic stenosis is present.  6. The inferior vena cava is normal in size with greater than 50% respiratory variability, suggesting right atrial pressure of 3 mmHg. Comparison(s): No significant change from prior study. Prior images reviewed side by side. FINDINGS  Left Ventricle: Left ventricular ejection fraction, by estimation, is 60 to 65%. The left ventricle has normal function. The left ventricle has no regional wall motion abnormalities. The left ventricular internal cavity size was normal in size. There is  no left ventricular hypertrophy. Left ventricular diastolic parameters are consistent with Grade I diastolic dysfunction (impaired relaxation). Indeterminate filling pressures. Right Ventricle: The right ventricular size is normal. No increase in right ventricular wall thickness. Right ventricular systolic function is normal. Left Atrium: Left atrial size was mildly dilated. Right Atrium: Right atrial size was normal in size. Pericardium: There  is no evidence of pericardial effusion. Mitral Valve: The mitral valve is normal in structure. No  evidence of mitral valve regurgitation. No evidence of mitral valve stenosis. Tricuspid Valve: The tricuspid valve is normal in structure. Tricuspid valve regurgitation is trivial. No evidence of tricuspid stenosis. Aortic Valve: The aortic valve is normal in structure. Aortic valve regurgitation is trivial. No aortic stenosis is present. Aortic valve mean gradient measures 3.0 mmHg. Aortic valve peak gradient measures 5.9 mmHg. Aortic valve area, by VTI measures 2.32 cm. Pulmonic Valve: The pulmonic valve was normal in structure. Pulmonic valve regurgitation is not visualized. No evidence of pulmonic stenosis. Aorta: The aortic root is normal in size and structure. Venous: The inferior vena cava is normal in size with greater than 50% respiratory variability, suggesting right atrial pressure of 3 mmHg. IAS/Shunts: No atrial level shunt detected by color flow Doppler.  LEFT VENTRICLE PLAX 2D LVIDd:         3.90 cm  Diastology LVIDs:         2.50 cm  LV e' medial:    7.18 cm/s LV PW:         1.10 cm  LV E/e' medial:  11.9 LV IVS:        1.10 cm  LV e' lateral:   8.38 cm/s LVOT diam:     1.80 cm  LV E/e' lateral: 10.2 LV SV:         54 LV SV Index:   33 LVOT Area:     2.54 cm  RIGHT VENTRICLE             IVC RV S prime:     18.40 cm/s  IVC diam: 1.20 cm TAPSE (M-mode): 3.0 cm LEFT ATRIUM             Index       RIGHT ATRIUM           Index LA diam:        3.80 cm 2.34 cm/m  RA Area:     12.80 cm LA Vol (A2C):   28.7 ml 17.64 ml/m RA Volume:   29.20 ml  17.94 ml/m LA Vol (A4C):   34.8 ml 21.39 ml/m LA Biplane Vol: 32.4 ml 19.91 ml/m  AORTIC VALVE AV Area (Vmax):    2.20 cm AV Area (Vmean):   2.19 cm AV Area (VTI):     2.32 cm AV Vmax:           121.50 cm/s AV Vmean:          81.900 cm/s AV VTI:            0.233 m AV Peak Grad:      5.9 mmHg AV Mean Grad:      3.0 mmHg LVOT Vmax:         105.00 cm/s LVOT Vmean:        70.400 cm/s LVOT VTI:          0.212 m LVOT/AV VTI ratio: 0.91  AORTA Ao Root diam: 2.50 cm  Ao Asc diam:  2.90 cm MITRAL VALVE                TRICUSPID VALVE MV Area (PHT): 3.17 cm     TR Peak grad:   20.8 mmHg MV Decel Time: 239 msec     TR Vmax:        228.00 cm/s MV E velocity: 85.10 cm/s MV A velocity: 118.00 cm/s  SHUNTS MV E/A ratio:  0.72         Systemic VTI:  0.21 m                             Systemic Diam: 1.80 cm Thurmon Fair MD Electronically signed by Thurmon Fair MD Signature Date/Time: 02/23/2021/4:12:19 PM    Final    CUP PACEART REMOTE DEVICE CHECK  Result Date: 02/09/2021 ILR summary report received. Battery status OK. Normal device function. No new symptom, tachy, brady, or pause episodes. No new AF episodes. Monthly summary reports and ROV/PRN.  RP  CT HEAD CODE STROKE WO CONTRAST  Result Date: 02/23/2021 CLINICAL DATA:  Code stroke. Initial evaluation for neuro deficit, stroke suspected. EXAM: CT HEAD WITHOUT CONTRAST TECHNIQUE: Contiguous axial images were obtained from the base of the skull through the vertex without intravenous contrast. COMPARISON:  Prior CT from 01/31/2020. FINDINGS: Brain: Streak/beam hardening artifact emanating from the left occipital calvarium noted, mildly limiting assessment. Cerebral volume within normal limits. Focal encephalomalacia at the central and left superior cerebellum, consistent with a chronic left MCA distribution infarct. Subtle evolving hypodensity noted involving the superior right cerebellum and cerebellar vermis, concerning for an acute right SCA distribution infarct (series 3, image 12, 11). No associated hemorrhage or mass effect. No other evidence for acute large vessel territory infarct. No intracranial hemorrhage. No mass lesion, mass effect or midline shift. No hydrocephalus or extra-axial fluid collection. Vascular: No hyperdense vessel. Skull: Scalp soft tissues demonstrate no acute finding. Small focal exostosis noted at the right frontal calvarium. Calvarium intact. Sinuses/Orbits: Globes and orbital soft tissues  within normal limits. Moderate left sphenoid and maxillary sinusitis. Additional scattered pneumatized secretions noted within the ethmoidal air cells. Subcentimeter osteoma noted within the right ethmoidal air cells as well. Mastoid air cells are clear. Other: None. ASPECTS [facility] Stroke Program Early CT Score) - Ganglionic level infarction (caudate, lentiform nuclei, internal capsule, insula, M1-M3 cortex): 7 - Supraganglionic infarction (M4-M6 cortex): 3 Total score (0-10 with 10 being normal): 10 IMPRESSION: 1. Evolving hypodensity involving the superior right cerebellum/cerebellar vermis, concerning for an acute right SCA distribution infarct. No intracranial hemorrhage or mass effect. 2. ASPECTS is 10. 3. Underlying chronic left SCA distribution infarcts. Critical Value/emergent results were called by telephone at the time of interpretation on 02/23/2021 at 12:55 a.m. to provider Dr. Amada Jupiter, Who verbally acknowledged these results. Electronically Signed   By: Rise Mu M.D.   On: 02/23/2021 01:23   CT ANGIO HEAD NECK W WO CM (CODE STROKE)  Result Date: 02/23/2021 CLINICAL DATA:  Initial evaluation for neuro deficit, stroke suspected. EXAM: CT ANGIOGRAPHY HEAD AND NECK TECHNIQUE: Multidetector CT imaging of the head and neck was performed using the standard protocol during bolus administration of intravenous contrast. Multiplanar CT image reconstructions and MIPs were obtained to evaluate the vascular anatomy. Carotid stenosis measurements (when applicable) are obtained utilizing NASCET criteria, using the distal internal carotid diameter as the denominator. CONTRAST:  75mL OMNIPAQUE IOHEXOL 350 MG/ML SOLN COMPARISON:  Prior head CT from earlier the same day. FINDINGS: CTA NECK FINDINGS Aortic arch: Visualized aortic arch normal in caliber with normal branch pattern. No stenosis about the origin of the great vessels. Right carotid system: Right common carotid artery patent from its origin  to the bifurcation without stenosis. Minimal plaque about the right carotid bulb without stenosis. Right ICA patent distally without stenosis, dissection or occlusion. Left carotid system: Left CCA patent from its origin to the bifurcation  without stenosis. Minimal plaque about the left carotid bulb without significant stenosis. Left ICA patent distally without stenosis, dissection or occlusion. Vertebral arteries: Left vertebral artery arises directly from the aortic arch. Right vertebral artery dominant. Vertebral arteries widely patent within the neck. Just prior to the skull base/dural reflection, there is distal right V3 segment (series 9, image 83), suspicious for a possible short-segment dissection. Small amount of probable intraluminal thrombus protrudes into the vascular lumen at this level. No frank raised dissection flap is seen. No more than mild stenosis at this level. The right vertebral artery is relatively normal just distally as it crosses into the cranial vault. Skeleton: No acute osseous finding. No discrete or worrisome osseous lesions. Mild cervical spondylosis noted at C5-6 without significant stenosis. Other neck: No other acute soft tissue abnormality within the neck. No mass or adenopathy. Upper chest: Visualized upper chest demonstrates no acute finding. Review of the MIP images confirms the above findings CTA HEAD FINDINGS Anterior circulation: Petrous, cavernous, and supraclinoid segments patent without stenosis or other abnormality. A1 segments widely patent. Normal anterior communicating artery complex. Anterior cerebral arteries patent to their distal aspects without stenosis. No M1 stenosis or occlusion. Normal MCA bifurcations. Distal MCA branches well perfused and symmetric. Posterior circulation: Both V4 segments patent to the vertebrobasilar junction without stenosis. Both PICA origins patent and normal. Basilar somewhat diminutive but patent to its distal aspect without stenosis.  Left superior cerebral artery patent. There is occlusion of the mid-distal right superior cerebellar artery, likely embolic (series 8, image 105). Left PCA supplied via the basilar as well as a prominent left posterior communicating artery. Predominant fetal type origin of the right PCA. Both PCAs remain widely patent to their distal aspects. Venous sinuses: Grossly patent allowing for timing the contrast bolus. Anatomic variants: Predominant fetal type origin of the right PCA. Review of the MIP images confirms the above findings IMPRESSION: 1. Focal intimal irregularity involving the distal right V3 segment, likely reflecting a short-segment dissection. No frank raised dissection flap or significant luminal narrowing. 2. 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 3. Mild for age atheromatous disease elsewhere about the major arterial vasculature of the head and neck. No other hemodynamically significant or correctable stenosis. These results were communicated to Dr. Amada Jupiter at 1:22 amon 5/10/2022by text page via the [facility] messaging system. Electronically Signed   By: Rise Mu M.D.   On: 02/23/2021 01:38    PHYSICAL EXAM  Temp:  [98.3 F (36.8 C)-99.2 F (37.3 C)] 98.3 F (36.8 C) (05/24 1416) Pulse Rate:  [92-97] 94 (05/24 1416) Resp:  [16-18] 18 (05/24 1416) BP: (114-147)/(70-81) 114/70 (05/24 1416) SpO2:  [98 %-99 %] 98 % (05/24 1416)  General - well nourished, well developed, in no apparent distress.    Ophthalmologic - fundi not visualized due to noncooperation.    Cardiovascular - regular rhythm and rate  Mental Status -  Level of arousal and orientation to time, place, and person were intact. Language including expression, naming, repetition, comprehension, reading, and writing was assessed and found intact Fund of Knowledge was assessed and was intact.  Cranial Nerves II - XII - II - Vision intact OU. III, IV, VI -  Extraocular movements intact, but with subjective binocular diplopia bilateral gaze V - Facial sensation intact bilaterally. VII - Facial movement intact bilaterally. VIII - few beats of gaze evoked nystagmus on bilateral gaze, L>R X - Palate elevates symmetrically. XI - Chin turning & shoulder shrug  intact bilaterally. XII - Tongue protrusion intact.  Motor Strength - The patient's strength was normal in all extremities and pronator drift was absent.   Motor Tone & Bulk - Muscle tone was assessed at the neck and appendages and was normal.  Bulk was normal and fasciculations were absent.   Reflexes - The patient's reflexes were normal in all extremities and she had no pathological reflexes.  Sensory - Light touch, temperature/pinprick were assessed and were normal.    Coordination - The patient had normal movements in the left hand and feet with no ataxia or dysmetria.    Slight right finger-to-nose dysmetria .  Tremor was absent.  Gait and Station - deferred   ASSESSMENT/PLAN [name] is a 60 y.o. female with history of recurrent stroke, hypertension admitted on 02/26/2021 for rehab due to right cerebellar infarct. She has been working with therapist pretty well resolved vertigo or N/V. however, developed acute onset vertigo and N/V on 03/01/21. Stroke team was called back for evaluation.   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    MRI confirmed right superior cerebellum moderate infarct and punctate dorsal midbrain infarct.    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    CT head 5/16 right cerebellar infarct with increased edema and hemorrhagic conversion  CT repeat 5/18 new right PICA large infarct,  stable right SCA hemorrhagic infarct  CTA head and neck - right V3 and V4 luminal irregularity, suggestive dissection, new V3 outpouching suggestive small pseudoaneurysm  MRI brain - Large acute/subacute right PICA territory infarct and late subacute right SCA territory infarct with superimposed hemorrhagic transformation. Edema from of right cerebellar hemisphere infarcts results in leftward midline shift with mass effect on the fourth ventricle, mid brain and pons without evidence of hydrocephalus.  CT head stable right PICA and SCA infarcts, no hydrocephalus.  Loop recorder interrogation negative.    LDL 51  A1c 6.0.   echo 02/23/21 EF 60 to 65%.  No cardiac source of embolism.Marland Kitchen    Hypercoagulable work-up repeated again negative.    Was on aspirin 81 and the Brilinta 90 twice daily for 4 weeks and then Brilinta alone. Given hemorrhagic conversion, brilinta discontinued, continued on ASA 81. With new infarct and possible dissection, added plavix 5/19 after CT stable but stopped after follow-up CT showed worsening of hemorrhage..   Ongoing aggressive stroke risk factor management  Therapy recommendations:  CIR  Disposition:  Pending   Hx of stroke  left cerebellar stroke in 01/2020 status post tPA admitted to [facility].  At that time, CTA head and neck no LVO or stenosis, MRI showed left superior cerebellar infarct.  EF normal range.  TCD bubble study negative.  LDL 138, A1c 6.0.  Hypercoagulable work-up negative.  Bilateral lower extremity DVT negative.  Discharged with DAPT for 3 weeks and then aspirin alone, as well as Lipitor 40.  After discharge she follow-up with Dr. [name] at [facility], had loop recorder placed in 02/2020.  Hypertension  Stable  Resumed amlodipine 5mg   BP goal < 160 given hemorrhagic conversion  Long-term BP goal normotensive  Hyperlipidemia  Home meds:  Lipitor 40mg   LDL 51, at goal < 70  Continue lipitor 40  Continue statin at discharge  Other Stroke  Risk  Factors    Other Active Problems    Hospital day # 11 Patient appears to be neurologically stable.  Case discussed with neuroradiologist Dr. Corliss Skains.  Patient will need diagnostic cerebral catheter angiogram at some point but given hemorrhagic transformation she is not a candidate for aggressive antiplatelet therapy at the present time hence will wait till she is finished with inpatient rehab.  If neurological can condition declines may need emergent cerebral catheter angiogram and revascularization.    Long discussion with patient  and answered questions.  Plan repeat CT head tomorrow morning and if hemorrhagic transformation is stable may consider adding back Plavix.  Greater than 50% time during this 25-minute visit was spent in counseling and coordination of care and discussion with care team and family and answering questions. Delia Heady, MD Stroke Neurology 03/09/2021 2:46 PM    To contact Stroke Continuity provider, please refer to [email or web address]. After hours, contact General Neurology

## 2021-03-09 NOTE — Consult Note (Signed)
Neuropsychological Consultation   Patient:   Judy Gutierrez   DOB:   [birth year]  MR Number:  [id number]  Location:  [facility] [address] CENTER [id number] [place] [address] Dept: [phone] Loc: [phone]           Date of Service:   03/08/2021  Start Time:   9:45 AM End Time:   10:30 AM  Provider/Observer:  Arley Phenix, Psy.D.       Clinical Neuropsychologist       Billing Code/Service: 16073  Chief Complaint:    Judy Gutierrez is a 60 year old female who has a prior medical history including previous CVA in April 2019.  The patient had been maintained on aspirin.  She also has a past medical history including hypertension and hyperlipidemia.  Patient presented on 02/22/2021 with acute onset of vertigo as well as nausea and vomiting.  CT of the head showed evolving hypodensity in the superior right cerebellum/cerebellar vermis, concerning for acute right MCA distribution infarction.  There was no intracranial hemorrhage or mass-effect noted.  MRI of brain showed 4-5 cm region of acute infarction within superior cerebellum on the right.  Punctuate acute infarcts in the right dorsal midbrain with mild swelling also noted.  An old infarction of the left superior cerebellum with atrophy and encephalomalacia was also noted.  Reason for Service:  Patient was referred for neuropsychological consultation due to coping and adjustment with her now second CVA in the cerebellum brain region.  Below is the HPI for the current admission.  HPI: Judy Gutierrez is a 60 year old right-handed female with history of prior CVA 01/2018 maintained on aspirin status post loop recorder, hypertension, hyperlipidemia.  History taken from chart review and patient. Patient lives with spouse independent prior to admission.  Two-level home bed and bath upstairs.  She has a sister with good support.  She presented on 02/22/2021 with vertigo, acute  onset as well as nausea and vomiting.  CT of the head showed evolving hypodensity involving the superior right cerebellum/cerebellar vermis, concerning for acute right SCA distribution infarction.  No intracranial hemorrhage or mass-effect.  CT angiogram of the head showed focal intimal irregularity involving the distal right V3 segment likely reflecting a short segment dissection.  Downstream occlusion of the mid distal right superior cerebellar artery likely embolic.  MRI of the brain showed a 4-5 cm region of acute infarction within the superior cerebellum on the right. Punctate acute infarct in the right dorsal midbrain with mild swelling.  Old infarction in the left superior cerebellum with atrophy/encephalomalacia.  Patient did not receive tPA.  Echocardiogram with EF of 60-65%, grade 1 diastolic dysfunction.  Admission chemistries unremarkable except potassium 2.8, glucose 120, urine drug screen negative alcohol negative patient currently maintained on low-dose aspirin as well as Brilinta for CVA prophylaxis x 4 weeks then Brilinta alone.  Subcutaneous Lovenox for DVT prophylaxis.  Tolerating a regular consistency diet.  Therapy evaluations completed due to patient's vertigo and decreased functional mobility was admitted for a comprehensive rehab program. Please see preadmission assessment from earlier today.   Current Status:  Upon entering the room, the patient was awake and alert and well oriented with her right lens of her eyeglasses taped over due to diplopia causing disturbance.  The patient appeared to be in a good mood state and denied severe depression or anxiety and was well oriented with good mental status.  The patient reports that she has been stressed by  her now second CVA and worries about future cerebrovascular events.  Behavioral Observation: Judy Gutierrez  presents as a 60 y.o.-year-old Right handed [place] Female who appeared her stated age. her dress was Appropriate and she was Well  Groomed and her manners were Appropriate to the situation.  her participation was indicative of Appropriate and Attentive behaviors.  There were physical disabilities noted.  she displayed an appropriate level of cooperation and motivation.     Interactions:    Active Appropriate  Attention:   within normal limits and attention span and concentration were age appropriate  Memory:   within normal limits; recent and remote memory intact  Visuo-spatial:  abnormal  Speech (Volume):  normal  Speech:   normal; normal  Thought Process:  Coherent and Relevant  Though Content:  WNL; not suicidal and not homicidal  Orientation:   person, place, time/date and situation  Judgment:   Fair  Planning:   Fair  Affect:    Anxious  Mood:    Anxious  Insight:   Good  Intelligence:   normal  Medical History:   Past Medical History:  Diagnosis Date  . Disturbance of skin sensation   . Myalgia and myositis, unspecified   . Other B-complex deficiencies   . 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 Stroke (HCC)   . Unspecified essential hypertension          Patient Active Problem List   Diagnosis Date Noted  . Tachycardia   . Acute cerebral infarction (HCC)   . Visual disturbance as complication of stroke   . Transaminitis   . Embolic stroke involving right cerebellar artery (HCC) 02/26/2021  . Acute blood loss anemia   . History of CVA (cerebrovascular accident)   . Benign essential HTN   . Prediabetes   . Hyponatremia   . Acute CVA (cerebrovascular accident) (HCC) 02/23/2021  . Dyslipidemia 06/01/2020  . 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 Essential hypertension 02/05/2020  . 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 Musculoskeletal chest pain 09/10/2014  . Hand pain 02/12/2014  . Fracture of left ulna 11/05/2013  . E. coli UTI 11/01/2013  . Anemia due to blood loss, acute 10/24/2013  . Pelvic fracture (HCC)  10/23/2013  . Hyperglycemia 09/18/2013  . Routine general medical examination at a health care facility 09/18/2013  . Elevated alkaline phosphatase level 09/18/2013  . Hot flashes, menopausal 04/10/2013  . Pain in joint, upper arm 04/10/2013          Psychiatric History:  No prior psychiatric history noted  Family Med/Psych History:  Family History  Problem Relation Age of Onset  . Pulmonary embolism Mother   . Hepatitis Father   . Diabetes Father   . Diabetes Mellitus II Sister   . Breast cancer Neg Hx    Impression/DX:  Judy Gutierrez is a 60 year old female who has a prior medical history including previous CVA in April 2019.  The patient had been maintained on aspirin.  She also has a past medical history including hypertension and hyperlipidemia.  Patient presented on 02/22/2021 with acute onset of vertigo as well as nausea and vomiting.  CT of the head showed evolving hypodensity in the superior right cerebellum/cerebellar vermis, concerning for acute right MCA distribution infarction.  There was no intracranial hemorrhage or mass-effect noted.  MRI of brain showed 4-5 cm region of acute infarction within superior cerebellum on the right.  Punctuate acute infarcts in the  right dorsal midbrain with mild swelling also noted.  An old infarction of the left superior cerebellum with atrophy and encephalomalacia was also noted.  Upon entering the room, the patient was awake and alert and well oriented with her right lens of her eyeglasses taped over due to diplopia causing disturbance.  The patient appeared to be in a good mood state and denied severe depression or anxiety and was well oriented with good mental status.  The patient reports that she has been stressed by her now second CVA and worries about future cerebrovascular events.  Disposition/Plan:  Today we worked on coping and adjustment issues with her now second cerebellum CVA and its impact on motor functioning primarily.  Patient  denied any significant change in emotional responses but is having some coping issues and we addressed adaptive functioning.  Diagnosis:    History of CVA (cerebrovascular accident) - Plan: Ambulatory referral to Neurology         Electronically Signed   _______________________ Arley Phenix, Psy.D. Clinical Neuropsychologist

## 2021-03-09 NOTE — Progress Notes (Signed)
Physical Therapy Session Note  Patient Details  Name: Judy Gutierrez MRN: [id number] Date of Birth: 1961/01/14  Today's Date: 03/09/2021 PT Individual Time: 1130-1200 + 1300-1355 PT Individual Time Calculation (min): 30 min  + 55 min  Short Term Goals: Week 2:  PT Short Term Goal 1 (Week 2): STG = LTG due to ELOS  Skilled Therapeutic Interventions/Progress Updates:     1st session: Pt greeted supine in bed, agreeable to PT session. No reports of pain and motivated to participate. Supine<>sit with supervision with extra effort for trunk management 2/2 truncal ataxia. Donned socks and tennis shoes with maxA for time management and completed stand<>pivot transfer with RW and CGA to w/c. Removed leg rests and instructed on w/c propulsion using BLE and LUE (unable to functionally use RUE for w/c mobility due to ataxia). Pt able to propel herself from her room to main rehab gym on level tile, ~245f, with supervision. Cues for reducing R veering and using BLE's to assist with steering > propelling. Wheeled in // bars to focus remainder of session on NMR for standing balance and gait training:  -feet apart, eyes open - unsupported      - able to stand ~30 second intervals while unsupported with truncal ataxia impacting, Cues for using mirror for visual feedback to assist  -feet apart, eyes closed - unsupported    - focusing on somatosensory system for balance, able to stand for ~15 second intervals while unsupported with truncal ataxia impacting.  -feet together, eyes open - unsupported    - Unable to stand for >5 seconds while unsupported. Requires minA for static standing without support.  -Gait in // bars with unilateral support from LUE with 1.5# ankle weights, working on ambulating length of bars forward stepping and backward stepping with minA overall.  Wheeled back to her room with totalA for time management and completed stand<>pivot transfer with minA and no AD back to bed. Bed mobility  completed with supervision and pt remained supine in bed with needs in reach and bed alarm on.   2nd session: Pt greeted supine in bed at start of session, agreeable to PT tx. No reports of pain. Supine<>sit with supervision with HOB minimally elevated. Completed stand<>pivot transfer with CGA and RW to w/c. Propelled herself with BLE's and LUE in w/c on level ground from her room to main rehab gym, ~2017f- cues to reduce veering R - able to correct and maintain with cues. Gait training [id number] minA and RW - gait deficits include inconsistent steps with overcorrecting patterns, frequent minor LOB due to truncal and R hemibody ataxia, and difficulty with RW management.   Completed stair training, 2x12 steps with minA and 1 hand rail on L - completes via lateral stepping method with cues needed to reduce overcrowding on narrow step and monitoring R foot placement due to ataxia.   Focused remainder of session on NMR for truncal ataxia and weight bearing through all extremities: -tall kneeling on mat table, needing minA for truncal stability due to forward leans and difficult engaging posterior chain to reach neutral. -In quadruped, worked on alternating shoulder taps and alternating bird dogs (using UE's only. Needing AAROM for RUE.  Pt ended session supine in bed with needs in reach and husband at bedside, all needs met.  Therapy Documentation Precautions:  Precautions Precautions: Fall Precaution Comments: Diplopia, trunk/RUE/LE ataxic. Systolic BP <160<270trictions Weight Bearing Restrictions: No General:    Therapy/Group: Individual Therapy  ChriAlger Simons4/2022,  7:38 AM

## 2021-03-10 ENCOUNTER — Inpatient Hospital Stay (HOSPITAL_COMMUNITY): Payer: No Typology Code available for payment source

## 2021-03-10 DIAGNOSIS — G911 Obstructive hydrocephalus: Secondary | ICD-10-CM

## 2021-03-10 NOTE — Patient Care Conference (Addendum)
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 Maryla Morrow Care Coodinator Present: Chana Bode, RN, BSN, CRRN;Cecile Sheerer, LCSWA Nurse Present: Chana Bode, RN PT Present: Wynelle Link, PT OT Present: Other (comment) Catering manager) SLP Present: Colin Benton, SLP PPS Coordinator present : Fae Pippin, SLP     Current Status/Progress Goal Weekly Team Focus  Bowel/Bladder             Swallow/Nutrition/ Hydration             ADL's   SUP self care, SUP-CGA with ADL transfers  Supervision transfers, LB dressing, toileting,  ADL training, mobility, balance, pt education   Mobility   Supervision bed mobility, minA transfers, Gait 184ft minA with RW, 12 stairs minA  Downgraded to minA due to medical complications and slower than anticipated progress  Safety awareness, pt and family education, functional gait and transfer training, NMR for balance. DC planning   Communication   min dysarthria at conversational level  mod I at conversational level  use of intelligiblity strategies, awareness of decreased intell. and self correcting   Safety/Cognition/ Behavioral Observations  Supervision  Mod for  complex cog  awaress of errors, complex problem solving   Pain             Skin               Discharge Planning:  Home with husband who works during the day and sister to stay for short time, but then will be alone.   Team Discussion: Abnormal CT scan noted extension of previous stroke. Unfortunate situation with new stroke and extension of previous strokes; little progress noted overall. MD plans repeat CT on 03/14/21.  Patient on target to meet rehab goals: Currently min assit overall, able to ambulate 150' with ataxic and unsteady gait. Manages 12 steps with min assist. Recommend wheelchair for discharge.  CGA for lower body care and supervision for upper body care. Goals set for supervision overall.  *See Care Plan and progress notes for long and short-term goals.   Revisions to Treatment Plan:  Working on error awareness, self correction and problem solving Teaching Needs: Safety, transfers, toileting, medications, secondary stroke risk management, etc.   Current Barriers to Discharge: Decreased caregiver support and Home enviroment access/layout  Possible Resolutions to Barriers: Family education HH follow up services    Medical Summary Current Status: Vertigo nausea vomiting secondary to RIght  superior cerebellum infarct with punctate acute infarct in the right dorsal midbrain with hemorrhagic conversion, and now right PICA infarct as well as history of CVA 2019/loop recorder  Barriers to Discharge: Decreased family/caregiver support;Medical stability;Other (comments)  Barriers to Discharge Comments: Increasing hemorrhagic conversion, new bleed Possible Resolutions to  Barriers/Weekly Focus: Therapies, optimize BP meds, follow labs - Hb, Neuro recs   Continued Need for Acute Rehabilitation Level of Care: The patient requires daily medical management by a physician with specialized training in physical medicine and rehabilitation for the following reasons: Direction of a  multidisciplinary physical rehabilitation program to maximize functional independence : Yes Medical management of patient stability for increased activity during participation in an intensive rehabilitation regime.: Yes Analysis of laboratory values and/or radiology reports with any subsequent need for medication adjustment and/or medical intervention. : Yes   I attest that I was present, lead the team conference, and concur with the assessment and plan of the team.   Chana Bode B 03/10/2021, 2:20 PM

## 2021-03-10 NOTE — Progress Notes (Signed)
Speech Language Pathology Daily Session Note  Patient Details  Name: Tien Aispuro MRN: [id number] Date of Birth: 11-16-1960  Today's Date: 03/10/2021 SLP Individual Time: 1345-1415 SLP Individual Time Calculation (min): 30 min  Short Term Goals: Week 1: SLP Short Term Goal 1 (Week 1): Pt will understand, recall and utilize compensatory strategies for dysarthria with 90% accuracy Supervision A SLP Short Term Goal 2 (Week 1): Pt will complete medication management task with 100% accuracy Supervision A SLP Short Term Goal 3 (Week 1): Pt will complete complex problem solving task for home environment with 90% accuracy Supervision A  Skilled Therapeutic Interventions: Skilled SLP intervention focused on cognition. Pt and family education completed on pts current cognitive skills and supervision that will be needed to eliminate errors with higher level cognitive tasks. Sister and husband verbalized understanding. Supervision A required with medication management task. Family educated on speech intelligibility strategies to cue pt on to increase speech clarity when needed. Pt is making excellent progress with goals. Cont with therapy per plan of care.      Pain Pain Assessment Pain Scale: Faces Faces Pain Scale: No hurt  Therapy/Group: Individual Therapy  Carlean Jews Rambo Sarafian 03/10/2021, 2:53 PM

## 2021-03-10 NOTE — Progress Notes (Signed)
STROKE TEAM PROGRESS NOTE   SUBJECTIVE (INTERVAL HISTORY) Patient is lying comfortably in bed.  She denies any headache, nausea or new complaints.  Patient`s  neurological exam is unchanged, still has mild diplopia and right arm incoordination   .       Vital signs are stable.  Repeat CT scan of the head shows slightly increased hemorrhagic transformation and cytotoxic edema with compression of the fourth ventricle and slight worsening of hydrocephalus. OBJECTIVE Temp:  [98.3 F (36.8 C)-99.2 F (37.3 C)] 99.2 F (37.3 C) (05/25 0456) Pulse Rate:  [83-94] 83 (05/25 0456) Resp:  [16-18] 17 (05/25 0456) BP: (114-129)/(70-84) 122/84 (05/25 0456) SpO2:  [98 %-100 %] 100 % (05/25 0456)  No results for input(s): GLUCAP in the last 168 hours. Recent Labs  Lab 03/05/21 0615 03/08/21 0945  NA 134* 135  K 3.9 3.9  CL 104 102  CO2 25 25  GLUCOSE 116* 134*  BUN 10 15  CREATININE 0.63 0.58  CALCIUM 8.8* 9.0   Recent Labs  Lab 03/05/21 0615  AST 45*  ALT 40  ALKPHOS 72  BILITOT 0.8  PROT 7.5  ALBUMIN 3.4*   Recent Labs  Lab 03/08/21 0945  WBC 6.0  NEUTROABS 3.6  HGB 12.4  HCT 36.8  MCV 86.8  PLT 299   No results for input(s): CKTOTAL, CKMB, CKMBINDEX, TROPONINI in the last 168 hours. No results for input(s): LABPROT, INR in the last 72 hours. No results for input(s): COLORURINE, LABSPEC, PHURINE, GLUCOSEU, HGBUR, BILIRUBINUR, KETONESUR, PROTEINUR, UROBILINOGEN, NITRITE, LEUKOCYTESUR in the last 72 hours.  Invalid input(s): APPERANCEUR     Component Value Date/Time   CHOL 105 02/23/2021 0745   CHOL 191 09/09/2015 0804   TRIG 28 02/23/2021 0745   HDL 48 02/23/2021 0745   HDL 60 09/09/2015 0804   CHOLHDL 2.2 02/23/2021 0745   VLDL 6 02/23/2021 0745   LDLCALC 51 02/23/2021 0745   LDLCALC 120 (H) 09/09/2015 0804   Lab Results  Component Value Date   HGBA1C 6.0 (H) 02/23/2021      Component Value Date/Time   LABOPIA NONE DETECTED 02/23/2021 0033   COCAINSCRNUR NONE  DETECTED 02/23/2021 0033   LABBENZ NONE DETECTED 02/23/2021 0033   AMPHETMU NONE DETECTED 02/23/2021 0033   THCU NONE DETECTED 02/23/2021 0033   LABBARB NONE DETECTED 02/23/2021 0033    No results for input(s): ETH in the last 168 hours.  I have personally reviewed the radiological images below and agree with the radiology interpretations.  CT ANGIO HEAD NECK W WO CM  Result Date: 03/03/2021 CLINICAL DATA:  Stroke follow-up. EXAM: CT ANGIOGRAPHY HEAD AND NECK TECHNIQUE: Multidetector CT imaging of the head and neck was performed using the standard protocol during bolus administration of intravenous contrast. Multiplanar CT image reconstructions and MIPs were obtained to evaluate the vascular anatomy. Carotid stenosis measurements (when applicable) are obtained utilizing NASCET criteria, using the distal internal carotid diameter as the denominator. CONTRAST:  50mL OMNIPAQUE IOHEXOL 350 MG/ML SOLN COMPARISON:  CT angiogram of the head and neck Feb 23, 2021. FINDINGS: CTA NECK FINDINGS Aortic arch: 4 vessel aortic arch with direct origin of the left vertebral artery from the aortic arch in. Imaged portion shows no evidence of aneurysm or dissection. No significant stenosis of the major arch vessel origins. Right carotid system: Minimal atherosclerotic changes of the right carotid bifurcation without hemodynamically significant stenosis. Left carotid system: Mild atherosclerotic changes of the left carotid bifurcation without hemodynamically significant stenosis. Vertebral arteries: Right dominant.  The non dominant left vertebral artery has normal course and caliber from the aortic arch to the vertebrobasilar junction. Area of luminal irregularity is again seen in the V3 segment of the right vertebral artery now with mild stenosis followed by small (1-2 mm) outpouching suggesting pseudoaneurysm. There is also luminal irregularity at the V4 segment. Skeleton: Negative. Other neck: Negative. Upper chest:  Negative. Review of the MIP images confirms the above findings CTA HEAD FINDINGS Anterior circulation: No significant stenosis, proximal occlusion, aneurysm, or vascular malformation. Posterior circulation: Mild luminal irregularity of the V4 segment of the right vertebral artery which involves the origin of the right PICA which appear patent. The basilar artery is small in caliber without area of focal stenosis, likely related to the presence of bilateral prominent posterior communicating arteries. The right P1/PCA segment is absent (fetal PCA). Interval recanalization of the right superior cerebellar artery. Venous sinuses: As permitted by contrast timing, patent. Anatomic variants: Right fetal PCA. Prominent left posterior communicating artery. Review of the MIP images confirms the above findings IMPRESSION: 1. Luminal irregularity in the V3 and V4 segments of the right vertebral artery is suggestive of dissection without hemodynamically significant stenosis. Area of irregularity of the origin of the right PICA which remains patent. There is a new diminutive outpouching from the right V3 segment suggestive of a small pseudoaneurysm. 2. Interval recanalization of the right superior cerebellar artery. 3. No significant findings in the anterior circulation. Electronically Signed   By: Baldemar Lenis M.D.   On: 03/03/2021 12:54   CT Head Wo Contrast  Result Date: 03/10/2021 CLINICAL DATA:  Mar 06, 2021. EXAM: CT HEAD WITHOUT CONTRAST TECHNIQUE: Contiguous axial images were obtained from the base of the skull through the vertex without intravenous contrast. COMPARISON:  CT head Mar 06, 2021. FINDINGS: Brain: Large area of hypodensity involving the inferior right cerebellum, compatible with PICA territory infarct. Increased acute hemorrhage associated with the right superior cerebellar infarct, compatible with worsening hemorrhagic transformation. There is new intraventricular extension of hemorrhage  into the fourth ventricle (series 3, image 9 and series 6, image 31). There is persistent mass effect on the fourth ventricle which is effaced and displaced to the left. Increased edema in the superior right cerebellum surrounding the hemorrhage. There is increasing ventriculomegaly with increased rounding of the temporal horns and outwardly convex third ventricle, compatible with worsening hydrocephalus. No supratentorial midline shift. No definite interval/new large vascular territory infarct. Vascular: No hyperdense vessel identified. Calcific atherosclerosis. Skull: No acute fracture. Sinuses/Orbits: Paranasal sinus mucosal thickening with air-fluid levels in the left maxillary and left sphenoid sinuses. Right ethmoid osteoma. Other: Clear mastoid air cells. IMPRESSION: 1. 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 Increased edema in the superior right cerebellum surrounding the hemorrhage. 2. Progressive ventriculomegaly, compatible with developing obstructive hydrocephalus. These results will be called to the ordering clinician or representative by the Radiologist Assistant, and communication documented in the PACS or [employer]. Electronically Signed   By: Feliberto Harts MD   On: 03/10/2021 07:33   CT HEAD WO CONTRAST  Result Date: 03/06/2021 CLINICAL DATA:  Stroke follow-up EXAM: CT HEAD WITHOUT CONTRAST TECHNIQUE: Contiguous axial images were obtained from the base of the skull through the vertex without intravenous contrast. COMPARISON:  CT head 03/16/2021 FINDINGS: Brain: Large hypodensity right inferior cerebellum compatible with PICA infarct unchanged. Hemorrhagic transformation of right superior  cerebellar infarct unchanged from earlier today. There is mass-effect on the fourth ventricle which is effaced and mildly displaced  to the left. Mild ventricular enlargement is present unchanged from earlier today but progressed since 02/23/2021 Chronic infarct left superior cerebellum unchanged. Vascular: Negative for hyperdense vessel Skull: Negative Sinuses/Orbits: Mucosal edema paranasal sinuses. Air-fluid level left sphenoid sinus. Osteoma right ethmoid sinus. Mastoid clear bilaterally. Other: None IMPRESSION: Stable CT head. 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 Electronically Signed   By: Marlan Palau M.D.   On: 03/06/2021 15:56   CT HEAD WO CONTRAST  Addendum Date: 03/06/2021   ADDENDUM REPORT: 03/06/2021 07:54 ADDENDUM: Study discussed by telephone with Dr. Iver Nestle on 03/06/2021 at 0611 hours. Electronically Signed   By: Odessa Fleming M.D.   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 Subsequent encounter. EXAM: CT HEAD WITHOUT CONTRAST TECHNIQUE: Contiguous axial images were obtained from the base of the skull through the vertex without intravenous contrast. COMPARISON:  Head CT 03/04/2021 and earlier. FINDINGS: Brain: Lobular hyperdense hemorrhage has progressed in the right SCA territory since 03/04/2021, with increasing regional edema and mass effect compatible with progressive hemorrhagic transformation (coronal image 46). The volume of hyperdense blood there is approximately 3 mL. Confluent right PICA infarct with cytotoxic edema which has mildly progressed since 03/03/2021. Combined, there is mild progressive basilar cistern effacement, although the cisterna magna remains patent. Fourth ventricle remains effaced. Since 03/03/2021 lateral and 3rd ventricle size is slightly increased (series 3, image 14 today versus series 3, image 17 on 03/03/2021). No supratentorial  transependymal edema. Supratentorial gray-white matter differentiation preserved with no midline shift. No supratentorial hemorrhage. Vascular: Mild Calcified atherosclerosis at the skull base. Skull: Stable. Small benign right frontal bone exostosis on series 4, image 41. Sinuses/Orbits: Stable paranasal sinuses. Tympanic cavities and mastoids remain clear. Other: Stable leftward gaze deviation. Visualized scalp soft tissues are within normal limits. IMPRESSION: 1. 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 2. At the same time cytotoxic edema from the large right PICA infarct has mildly progressed. 3. Subsequent increased posterior fossa mass although the pre medullary cistern and cisterna magna remain patent. 4. There has been subtle enlargement of the lateral and 3rd ventricles since 03/03/2021, but there is no transependymal edema. Electronically Signed: By: Odessa Fleming M.D. On: 03/06/2021 06:05   CT HEAD WO CONTRAST  Result Date: 03/04/2021 CLINICAL DATA:  60 year old female with new right PICA infarct superimposed on recent right SCA infarct. EXAM: CT HEAD WITHOUT CONTRAST TECHNIQUE: Contiguous axial images were obtained from the base of the skull through the vertex without intravenous contrast. COMPARISON:  Brain MRI, CTA head and neck, CT head yesterday. FINDINGS: Brain: Supratentorial gray-white matter differentiation remains stable. And the lateral and 3rd ventricle size and configuration is stable with temporal horns remaining diminutive on series 3, image 9. Subacute right SCA infarct with petechial hemorrhage and more acute confluent right PICA infarct with cytotoxic edema redemonstrated. Stable posterior fossa mass effect including on the 4th ventricle. Superimposed chronic left SCA cerebellar infarct. Posterior fossa gray-white matter differentiation and basilar cisterns are stable since 0645 hours  yesterday. Vascular: Mild Calcified atherosclerosis at the skull base. Skull: Stable.  No acute osseous abnormality identified. Sinuses/Orbits: Stable occasional sinus mucosal  thickening and bubbly opacity. Tympanic cavities and mastoids remain clear. Other: Leftward gaze deviation as before. Visualized scalp soft tissues are within normal limits. IMPRESSION: 1. Stable since yesterday. Confluent Right PICA infarct superimposed on subacute Right SCA infarct with petechial hemorrhage. Stable posterior fossa mass effect. No lateral or 3rd ventriculomegaly at this time. 2. No new intracranial abnormality. Electronically Signed   By: Odessa Fleming M.D.   On: 03/04/2021 06:08   CT HEAD WO CONTRAST  Addendum Date: 03/03/2021   ADDENDUM REPORT: 03/03/2021 08:53 ADDENDUM: Study discussed by telephone with Dr. [name] on 03/03/2021 at 0847 hours. Electronically Signed   By: Odessa Fleming M.D.   On: 03/03/2021 08:53   Result Date: 03/03/2021 CLINICAL DATA:  60 year old female with right SCA cerebellar infarct on MRI 02/23/2021. Subsequent encounter. EXAM: CT HEAD WITHOUT CONTRAST TECHNIQUE: Contiguous axial images were obtained from the base of the skull through the vertex without intravenous contrast. COMPARISON:  Head CT 03/01/2021 and earlier. FINDINGS: Brain: Right superior cerebellar artery territory and cerebellar vermis cytotoxic edema with petechial hemorrhage. But new confluent cytotoxic edema now in the right PICA territory (series 3, image 5). Subsequent increased posterior fossa mass effect, with decreased prepontine and pre medullary cisterns. No tonsillar herniation. Increased effacement of the 4th ventricle, but lateral and 3rd ventricle size not significantly changed at this time. Supratentorial gray and white matter signal is stable. No new intracranial hemorrhage. Vascular: Mild Calcified atherosclerosis at the skull base. No suspicious intracranial vascular hyperdensity. Skull: No acute osseous abnormality  identified. Small benign right frontal bone exostosis on series 4, image 59. Sinuses/Orbits: Stable mucosal thickening and bubbly opacity in the left sphenoid, left maxillary, and posterior right ethmoid sinuses. Tympanic cavities and mastoids remain clear. Other: Leftward gaze deviation. Visualized scalp soft tissues are within normal limits. IMPRESSION: 1. Confluent acute right PICA infarct is new since 03/01/2021, superimposed on the recent right SCA infarct. Subsequent increased cytotoxic edema in the right cerebellum with increased posterior fossa mass effect including effaced 4th ventricle. But no malignant hemorrhagic transformation or ventriculomegaly at this time. 2. Supratentorial brain appears stable, negative. Electronically Signed: By: Odessa Fleming M.D. On: 03/03/2021 08:42   CT HEAD WO CONTRAST  Result Date: 03/01/2021 CLINICAL DATA:  Stroke follow-up. EXAM: CT HEAD WITHOUT CONTRAST TECHNIQUE: Contiguous axial images were obtained from the base of the skull through the vertex without intravenous contrast. COMPARISON:  CT head and MRI Feb 23, 2021. FINDINGS: Brain: Increased edema associated with the right cerebellar and right dorsal midbrain infarct. Increased resulting mass effect on the fourth ventricle without hydrocephalus. Interval development of acute hemorrhage at the site of right cerebellar infarct. No evidence of new/interval acute large vascular territory infarct. Remote left cerebellar infarct. Basal cisterns are patent. No inferior cerebellar tonsillar herniation or midline shift. No mass lesion. Vascular: No hyperdense vessel identified. Skull: No acute fracture. Sinuses/Orbits: Mucosal thickening the left maxillary sinus, right posterior ethmoid air cell and left sphenoid sinus with frothy secretions in the sinuses. Unremarkable orbits. Other: No mastoid effusions. IMPRESSION: 1. Increased edema associated with the right cerebellar and right dorsal midbrain infarcts with interval  development of acute hemorrhage at the site of right cerebellar infarct. Increased resulting mass effect on the fourth ventricle without hydrocephalus. 2. Paranasal sinus mucosal thickening with frothy secretions, as detailed above. Electronically Signed   By: Feliberto Harts MD   On: 03/01/2021 14:39   MR BRAIN WO CONTRAST  Result Date: 03/03/2021 CLINICAL DATA:  Stroke follow-up EXAM:  MRI HEAD WITHOUT CONTRAST TECHNIQUE: Multiplanar, multiecho pulse sequences of the brain and surrounding structures were obtained without intravenous contrast. COMPARISON:  Head CT Mar 03, 2021. FINDINGS: Brain: Large area of restricted diffusion involving the inferior right cerebellar hemisphere consistent with acute/subacute right PICA territory infarct. Patchy T1 hyperintensity with prominent susceptibility artifact in the superior aspect of the right cerebellar hemisphere with surrounding vasogenic edema is suggestive of hemorrhagic transformation of non prior right SCA territory infarct. Edema and mass effect from these 2 areas of infarcts resulting in leftward midline shift with mass effect on the fourth ventricle which remains patent as well as mass effect on the posterior aspect of the pons and mid brain with mild periaqueductal edema and small ascending transtentorial herniation. No tonsillar herniation. No hydrocephalus. Overall, findings are similar to prior CT performed earlier today Area of encephalomalacia and gliosis with hemosiderin staining in the superior left cerebellar hemisphere consistent with chronic left SCA territory infarct. Vascular: Normal flow voids. Skull and upper cervical spine: Normal marrow signal. Sinuses/Orbits: Mucosal thickening with bubbly secretion in the left maxillary and sphenoid sinuses. The orbits are maintained. Other: Right mastoid effusion. IMPRESSION: Large acute/subacute right PICA territory infarct and late subacute right SCA territory infarct with superimposed hemorrhagic  transformation. Edema from of right cerebellar hemisphere infarcts results in leftward midline shift with mass effect on the fourth ventricle, mid brain and pons without evidence of hydrocephalus. Findings appear stable compared to head CT performed earlier today. Electronically Signed   By: Baldemar Lenis M.D.   On: 03/03/2021 13:13   MR BRAIN WO CONTRAST  Result Date: 02/23/2021 CLINICAL DATA:  Dizziness, nausea and vomiting. EXAM: MRI HEAD WITHOUT CONTRAST TECHNIQUE: Multiplanar, multiecho pulse sequences of the brain and surrounding structures were obtained without intravenous contrast. COMPARISON:  CT studies same day FINDINGS: Brain: Diffusion imaging shows a 4-5 cm region of acute infarction within the superior cerebellum on the right. Acute infarction also within the dorsal right midbrain. Old infarction within the left superior cerebellum. Mild swelling but no acute hemorrhage in the right cerebellum. Old hemosiderin residual within the left cerebellar stroke. Cerebral hemispheres are normal. No mass, hydrocephalus or extra-axial collection. Vascular: No acute vascular finding by standard MRI. Skull and upper cervical spine: Negative Sinuses/Orbits: Mucosal inflammatory changes of the left maxillary and sphenoid sinuses. Orbits negative. Other: None IMPRESSION: 4-5 cm region of acute infarction within the superior cerebellum on the right. Punctate acute infarction in the right dorsal midbrain. Mild swelling. No evidence of acute hemorrhage. Old infarction in the left superior cerebellum with atrophy, encephalomalacia and gliosis and hemosiderin deposition. Electronically Signed   By: Paulina Fusi M.D.   On: 02/23/2021 08:42   ECHOCARDIOGRAM COMPLETE  Result Date: 02/23/2021    ECHOCARDIOGRAM REPORT   Patient Name:   [facility] Date of Exam: 02/23/2021 Medical Rec #:  [id number]   Height:       62.0 in Accession #:    [id number]  Weight:       136.9 lb Date of Birth:  03-11-1961  BSA:           1.627 m Patient Age:    59 years    BP:           135/72 mmHg Patient Gender: F           HR:           64 bpm. Exam Location:  Inpatient Procedure: 2D Echo, Cardiac Doppler and Color Doppler Indications:  Stroke  History:        Patient has prior history of Echocardiogram examinations, most                 recent 01/30/2020. Stroke; Risk Factors:Hypertension.  Sonographer:    Shirlean Kelly Referring Phys: [id number] CAROLE N HALL IMPRESSIONS  1. Left ventricular ejection fraction, by estimation, is 60 to 65%. The left ventricle has normal function. The left ventricle has no regional wall motion abnormalities. Left ventricular diastolic parameters are consistent with Grade I diastolic dysfunction (impaired relaxation).  2. Right ventricular systolic function is normal. The right ventricular size is normal.  3. Left atrial size was mildly dilated.  4. The mitral valve is normal in structure. No evidence of mitral valve regurgitation. No evidence of mitral stenosis.  5. The aortic valve is normal in structure. Aortic valve regurgitation is trivial. No aortic stenosis is present.  6. The inferior vena cava is normal in size with greater than 50% respiratory variability, suggesting right atrial pressure of 3 mmHg. Comparison(s): No significant change from prior study. Prior images reviewed side by side. FINDINGS  Left Ventricle: Left ventricular ejection fraction, by estimation, is 60 to 65%. The left ventricle has normal function. The left ventricle has no regional wall motion abnormalities. The left ventricular internal cavity size was normal in size. There is  no left ventricular hypertrophy. Left ventricular diastolic parameters are consistent with Grade I diastolic dysfunction (impaired relaxation). Indeterminate filling pressures. Right Ventricle: The right ventricular size is normal. No increase in right ventricular wall thickness. Right ventricular systolic function is normal. Left Atrium: Left atrial  size was mildly dilated. Right Atrium: Right atrial size was normal in size. Pericardium: There is no evidence of pericardial effusion. Mitral Valve: The mitral valve is normal in structure. No evidence of mitral valve regurgitation. No evidence of mitral valve stenosis. Tricuspid Valve: The tricuspid valve is normal in structure. Tricuspid valve regurgitation is trivial. No evidence of tricuspid stenosis. Aortic Valve: The aortic valve is normal in structure. Aortic valve regurgitation is trivial. No aortic stenosis is present. Aortic valve mean gradient measures 3.0 mmHg. Aortic valve peak gradient measures 5.9 mmHg. Aortic valve area, by VTI measures 2.32 cm. Pulmonic Valve: The pulmonic valve was normal in structure. Pulmonic valve regurgitation is not visualized. No evidence of pulmonic stenosis. Aorta: The aortic root is normal in size and structure. Venous: The inferior vena cava is normal in size with greater than 50% respiratory variability, suggesting right atrial pressure of 3 mmHg. IAS/Shunts: No atrial level shunt detected by color flow Doppler.  LEFT VENTRICLE PLAX 2D LVIDd:         3.90 cm  Diastology LVIDs:         2.50 cm  LV e' medial:    7.18 cm/s LV PW:         1.10 cm  LV E/e' medial:  11.9 LV IVS:        1.10 cm  LV e' lateral:   8.38 cm/s LVOT diam:     1.80 cm  LV E/e' lateral: 10.2 LV SV:         54 LV SV Index:   33 LVOT Area:     2.54 cm  RIGHT VENTRICLE             IVC RV S prime:     18.40 cm/s  IVC diam: 1.20 cm TAPSE (M-mode): 3.0 cm LEFT ATRIUM  Index       RIGHT ATRIUM           Index LA diam:        3.80 cm 2.34 cm/m  RA Area:     12.80 cm LA Vol (A2C):   28.7 ml 17.64 ml/m RA Volume:   29.20 ml  17.94 ml/m LA Vol (A4C):   34.8 ml 21.39 ml/m LA Biplane Vol: 32.4 ml 19.91 ml/m  AORTIC VALVE AV Area (Vmax):    2.20 cm AV Area (Vmean):   2.19 cm AV Area (VTI):     2.32 cm AV Vmax:           121.50 cm/s AV Vmean:          81.900 cm/s AV VTI:            0.233 m AV  Peak Grad:      5.9 mmHg AV Mean Grad:      3.0 mmHg LVOT Vmax:         105.00 cm/s LVOT Vmean:        70.400 cm/s LVOT VTI:          0.212 m LVOT/AV VTI ratio: 0.91  AORTA Ao Root diam: 2.50 cm Ao Asc diam:  2.90 cm MITRAL VALVE                TRICUSPID VALVE MV Area (PHT): 3.17 cm     TR Peak grad:   20.8 mmHg MV Decel Time: 239 msec     TR Vmax:        228.00 cm/s MV E velocity: 85.10 cm/s MV A velocity: 118.00 cm/s  SHUNTS MV E/A ratio:  0.72         Systemic VTI:  0.21 m                             Systemic Diam: 1.80 cm Rachelle HoraMihai Croitoru MD Electronically signed by Thurmon FairMihai Croitoru MD Signature Date/Time: 02/23/2021/4:12:19 PM    Final    CT HEAD CODE STROKE WO CONTRAST  Result Date: 02/23/2021 CLINICAL DATA:  Code stroke. Initial evaluation for neuro deficit, stroke suspected. EXAM: CT HEAD WITHOUT CONTRAST TECHNIQUE: Contiguous axial images were obtained from the base of the skull through the vertex without intravenous contrast. COMPARISON:  Prior CT from 01/31/2020. FINDINGS: Brain: Streak/beam hardening artifact emanating from the left occipital calvarium noted, mildly limiting assessment. Cerebral volume within normal limits. Focal encephalomalacia at the central and left superior cerebellum, consistent with a chronic left MCA distribution infarct. Subtle evolving hypodensity noted involving the superior right cerebellum and cerebellar vermis, concerning for an acute right SCA distribution infarct (series 3, image 12, 11). No associated hemorrhage or mass effect. No other evidence for acute large vessel territory infarct. No intracranial hemorrhage. No mass lesion, mass effect or midline shift. No hydrocephalus or extra-axial fluid collection. Vascular: No hyperdense vessel. Skull: Scalp soft tissues demonstrate no acute finding. Small focal exostosis noted at the right frontal calvarium. Calvarium intact. Sinuses/Orbits: Globes and orbital soft tissues within normal limits. Moderate left sphenoid and  maxillary sinusitis. Additional scattered pneumatized secretions noted within the ethmoidal air cells. Subcentimeter osteoma noted within the right ethmoidal air cells as well. Mastoid air cells are clear. Other: None. ASPECTS [facility]([place] Stroke Program Early CT Score) - Ganglionic level infarction (caudate, lentiform nuclei, internal capsule, insula, M1-M3 cortex): 7 - Supraganglionic infarction (M4-M6 cortex): 3 Total score (0-10 with 10 being  normal): 10 IMPRESSION: 1. Evolving hypodensity involving the superior right cerebellum/cerebellar vermis, concerning for an acute right SCA distribution infarct. No intracranial hemorrhage or mass effect. 2. ASPECTS is 10. 3. Underlying chronic left SCA distribution infarcts. Critical Value/emergent results were called by telephone at the time of interpretation on 02/23/2021 at 12:55 a.m. to provider Dr. Amada Jupiter, Who verbally acknowledged these results. Electronically Signed   By: Rise Mu M.D.   On: 02/23/2021 01:23   CT ANGIO HEAD NECK W WO CM (CODE STROKE)  Result Date: 02/23/2021 CLINICAL DATA:  Initial evaluation for neuro deficit, stroke suspected. EXAM: CT ANGIOGRAPHY HEAD AND NECK TECHNIQUE: Multidetector CT imaging of the head and neck was performed using the standard protocol during bolus administration of intravenous contrast. Multiplanar CT image reconstructions and MIPs were obtained to evaluate the vascular anatomy. Carotid stenosis measurements (when applicable) are obtained utilizing NASCET criteria, using the distal internal carotid diameter as the denominator. CONTRAST:  75mL OMNIPAQUE IOHEXOL 350 MG/ML SOLN COMPARISON:  Prior head CT from earlier the same day. FINDINGS: CTA NECK FINDINGS Aortic arch: Visualized aortic arch normal in caliber with normal branch pattern. No stenosis about the origin of the great vessels. Right carotid system: Right common carotid artery patent from its origin to the bifurcation without stenosis. Minimal  plaque about the right carotid bulb without stenosis. Right ICA patent distally without stenosis, dissection or occlusion. Left carotid system: Left CCA patent from its origin to the bifurcation without stenosis. Minimal plaque about the left carotid bulb without significant stenosis. Left ICA patent distally without stenosis, dissection or occlusion. Vertebral arteries: Left vertebral artery arises directly from the aortic arch. Right vertebral artery dominant. Vertebral arteries widely patent within the neck. Just prior to the skull base/dural reflection, there is distal right V3 segment (series 9, image 83), suspicious for a possible short-segment dissection. Small amount of probable intraluminal thrombus protrudes into the vascular lumen at this level. No frank raised dissection flap is seen. No more than mild stenosis at this level. The right vertebral artery is relatively normal just distally as it crosses into the cranial vault. Skeleton: No acute osseous finding. No discrete or worrisome osseous lesions. Mild cervical spondylosis noted at C5-6 without significant stenosis. Other neck: No other acute soft tissue abnormality within the neck. No mass or adenopathy. Upper chest: Visualized upper chest demonstrates no acute finding. Review of the MIP images confirms the above findings CTA HEAD FINDINGS Anterior circulation: Petrous, cavernous, and supraclinoid segments patent without stenosis or other abnormality. A1 segments widely patent. Normal anterior communicating artery complex. Anterior cerebral arteries patent to their distal aspects without stenosis. No M1 stenosis or occlusion. Normal MCA bifurcations. Distal MCA branches well perfused and symmetric. Posterior circulation: Both V4 segments patent to the vertebrobasilar junction without stenosis. Both PICA origins patent and normal. Basilar somewhat diminutive but patent to its distal aspect without stenosis. Left superior cerebral artery patent. There  is occlusion of the mid-distal right superior cerebellar artery, likely embolic (series 8, image 105). Left PCA supplied via the basilar as well as a prominent left posterior communicating artery. Predominant fetal type origin of the right PCA. Both PCAs remain widely patent to their distal aspects. Venous sinuses: Grossly patent allowing for timing the contrast bolus. Anatomic variants: Predominant fetal type origin of the right PCA. Review of the MIP images confirms the above findings IMPRESSION: 1. Focal intimal irregularity involving the distal right V3 segment, likely reflecting a short-segment dissection. No frank raised dissection flap or significant  luminal narrowing. 2. 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 3. Mild for age atheromatous disease elsewhere about the major arterial vasculature of the head and neck. No other hemodynamically significant or correctable stenosis. These results were communicated to Dr. Amada Jupiter at 1:22 amon 5/10/2022by text page via the [facility] messaging system. Electronically Signed   By: Rise Mu M.D.   On: 02/23/2021 01:38    PHYSICAL EXAM  Temp:  [98.3 F (36.8 C)-99.2 F (37.3 C)] 99.2 F (37.3 C) (05/25 0456) Pulse Rate:  [83-94] 83 (05/25 0456) Resp:  [16-18] 17 (05/25 0456) BP: (114-129)/(70-84) 122/84 (05/25 0456) SpO2:  [98 %-100 %] 100 % (05/25 0456)  General - well nourished, well developed, in no apparent distress.    Ophthalmologic - fundi not visualized due to noncooperation.    Cardiovascular - regular rhythm and rate  Mental Status -  Level of arousal and orientation to time, place, and person were intact. Language including expression, naming, repetition, comprehension, reading, and writing was assessed and found intact Fund of Knowledge was assessed and was intact.  Cranial Nerves II - XII - II - Vision intact OU. III, IV, VI - Extraocular movements intact, but with  subjective binocular diplopia bilateral gaze V - Facial sensation intact bilaterally. VII - Facial movement intact bilaterally. VIII - few beats of gaze evoked nystagmus on bilateral gaze, L>R X - Palate elevates symmetrically. XI - Chin turning & shoulder shrug intact bilaterally. XII - Tongue protrusion intact.  Motor Strength - The patient's strength was normal in all extremities and pronator drift was absent.   Motor Tone & Bulk - Muscle tone was assessed at the neck and appendages and was normal.  Bulk was normal and fasciculations were absent.   Reflexes - The patient's reflexes were normal in all extremities and she had no pathological reflexes.  Sensory - Light touch, temperature/pinprick were assessed and were normal.    Coordination - The patient had normal movements in the left hand and feet with no ataxia or dysmetria.    Slight right finger-to-nose dysmetria .  Tremor was absent.  Gait and Station - deferred   ASSESSMENT/PLAN [name] is a 60 y.o. female with history of recurrent stroke, hypertension admitted on 02/26/2021 for rehab due to right cerebellar infarct. She has been working with therapist pretty well resolved vertigo or N/V. however, developed acute onset vertigo and N/V on 03/01/21. Stroke team was called back for evaluation.   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    MRI confirmed right superior cerebellum moderate infarct and punctate dorsal midbrain infarct.    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    CT head 5/16 right cerebellar infarct with increased edema and hemorrhagic conversion  CT repeat 5/18 new right PICA large infarct, stable right SCA hemorrhagic  infarct  CTA head and neck - right V3 and V4 luminal irregularity, suggestive dissection, new V3 outpouching suggestive small pseudoaneurysm  MRI brain - Large acute/subacute right PICA territory infarct and late subacute right SCA territory infarct with superimposed hemorrhagic transformation. Edema from of right cerebellar hemisphere infarcts results in leftward midline shift with mass effect on the fourth ventricle, mid brain and pons without evidence of hydrocephalus. CT head 03/10/2021:  Large right PICA territory infarct with increased acute hemorrhagic transformation associated with a right superior cerebellar infarct and new intraventricular extension of hemorrhage into the fourth ventricle. Increased edema in the superior right cerebellum surrounding the hemorrhage.Progressive ventriculomegaly, compatible with developing obstructive hydrocephalus.  CT head stable right PICA and SCA infarcts, no hydrocephalus.  Loop recorder interrogation negative.    LDL 51  A1c 6.0.   echo 02/23/21 EF 60 to 65%.  No cardiac source of embolism.Marland Kitchen    Hypercoagulable work-up repeated again negative.    Was on aspirin 81 and the Brilinta 90 twice daily for 4 weeks and then Brilinta alone. Given hemorrhagic conversion, brilinta discontinued, continued on ASA 81. With new infarct and possible dissection, added plavix 5/19 after CT stable but stopped after follow-up CT showed worsening of hemorrhage..   Ongoing aggressive stroke risk factor management  Therapy recommendations:  CIR  Disposition:  Pending   Hx of stroke  left cerebellar stroke in 01/2020 status post tPA admitted to [facility].  At that time, CTA head and neck no LVO or stenosis, MRI showed left superior cerebellar infarct.  EF normal range.  TCD bubble study negative.  LDL 138, A1c 6.0.  Hypercoagulable work-up negative.  Bilateral lower extremity DVT negative.  Discharged with DAPT for 3 weeks and then aspirin alone, as well as Lipitor 40.   After discharge she follow-up with Dr. [name] at [facility], had loop recorder placed in 02/2020.  Hypertension  Stable  Resumed amlodipine   BP goal < 160 given hemorrhagic conversion  Long-term BP goal normotensive  Hyperlipidemia  Home meds:  Lipitor   LDL 51, at goal < 70  Continue lipitor 40  Continue statin at discharge  Other Stroke Risk Factors    Other Active Problems    Hospital day # 12 Patient appears to be neurologically stable even though follow-up CT scan from today shows worsening hydrocephalus and hemorrhagic transformation of cytotoxic edema.  Patient will need diagnostic cerebral catheter angiogram at some point but given hemorrhagic transformation she is not a candidate for aggressive antiplatelet therapy at the present time hence will wait till she is finished with inpatient rehab.  If neurological can condition declines may need transfer back to internal medicine in acute care side with management of cytotoxic edema and neurosurgical consultation for consideration for emergent ventriculostomy or posterior fossa decompression.   cerebral catheter angiogram and revascularization will have to wait till her condition stabilizes..    Long discussion with patient and Dr. Maryla Morrow and answered questions.  Plan to discontinue aspirin also due to increased hemorrhagic transformation.    Greater than 50% time during this 25-minute visit was spent in counseling and coordination of care and discussion with care team and family and answering questions. Delia Heady, MD Stroke Neurology 03/10/2021 12:57 PM    To contact Stroke Continuity provider, please refer to [email or web address]. After hours, contact General Neurology

## 2021-03-10 NOTE — Progress Notes (Addendum)
PROGRESS NOTE   Subjective/Complaints: Patient seen laying in bed this morning.  He states he slept well overnight.  She was seen by neurology yesterday, notes reviewed- plan was for repeat CT with possibility of adding Plavix if stable.  Called by nursing regarding results of CT scan.  Reviewed CT scan with neurology, discussed plan.  ROS: Denies CP, SOB, N/V/D  Objective:   CT Head Wo Contrast  Result Date: 03/10/2021 CLINICAL DATA:  Mar 06, 2021. EXAM: CT HEAD WITHOUT CONTRAST TECHNIQUE: Contiguous axial images were obtained from the base of the skull through the vertex without intravenous contrast. COMPARISON:  CT head Mar 06, 2021. FINDINGS: Brain: Large area of hypodensity involving the inferior right cerebellum, compatible with PICA territory infarct. Increased acute hemorrhage associated with the right superior cerebellar infarct, compatible with worsening hemorrhagic transformation. There is new intraventricular extension of hemorrhage into the fourth ventricle (series 3, image 9 and series 6, image 31). There is persistent mass effect on the fourth ventricle which is effaced and displaced to the left. Increased edema in the superior right cerebellum surrounding the hemorrhage. There is increasing ventriculomegaly with increased rounding of the temporal horns and outwardly convex third ventricle, compatible with worsening hydrocephalus. No supratentorial midline shift. No definite interval/new large vascular territory infarct. Vascular: No hyperdense vessel identified. Calcific atherosclerosis. Skull: No acute fracture. Sinuses/Orbits: Paranasal sinus mucosal thickening with air-fluid levels in the left maxillary and left sphenoid sinuses. Right ethmoid osteoma. Other: Clear mastoid air cells. IMPRESSION: 1. Large right PICA territory infarct with increased acute hemorrhagic transformation associated with a right superior cerebellar  infarct and new intraventricular extension of hemorrhage into the fourth ventricle. Increased edema in the superior right cerebellum surrounding the hemorrhage. 2. Progressive ventriculomegaly, compatible with developing obstructive hydrocephalus. These results will be called to the ordering clinician or representative by the Radiologist Assistant, and communication documented in the PACS or [employer]. Electronically Signed   By: Feliberto Harts MD   On: 03/10/2021 07:33   Recent Labs    03/08/21 0945  WBC 6.0  HGB 12.4  HCT 36.8  PLT 299   Recent Labs    03/08/21 0945  NA 135  K 3.9  CL 102  CO2 25  GLUCOSE 134*  BUN 15  CREATININE 0.58  CALCIUM 9.0    Intake/Output Summary (Last 24 hours) at 03/10/2021 1004 Last data filed at 03/09/2021 1700 Gross per 24 hour  Intake 300 ml  Output --  Net 300 ml        Physical Exam: Vital Signs Blood pressure 122/84, pulse 83, temperature 99.2 F (37.3 C), resp. rate 17, height 5\' 2"  (1.575 m), weight 58.1 kg, last menstrual period 12/08/2010, SpO2 100 %.  Constitutional: No distress . Vital signs reviewed. HENT: Normocephalic.  Atraumatic. Eyes: EOMI. No discharge. Cardiovascular: No JVD.  RRR. Respiratory: Normal effort.  No stridor.  Bilateral clear to auscultation. GI: Non-distended.  BS +. Skin: Warm and dry.  Intact. Psych: Normal mood.  Normal behavior. Musc: No edema in extremities.  No tenderness in extremities. Neuro: Alert Motor: 5/5 throughout RUE dysmetria and ataxia, unchanged LUE dysmetria improved Dysarthria, stable  Assessment/Plan: 1. Functional deficits  which require 3+ hours per day of interdisciplinary therapy in a comprehensive inpatient rehab setting.  Physiatrist is providing close team supervision and 24 hour management of active medical problems listed below.  Physiatrist and rehab team continue to assess barriers to discharge/monitor patient progress toward functional and medical  goals  Care Tool:  Bathing    Body parts bathed by patient: Right arm,Right lower leg,Left arm,Chest,Abdomen,Front perineal area,Buttocks,Left upper leg,Left lower leg,Face,Right upper leg         Bathing assist Assist Level: Supervision/Verbal cueing     Upper Body Dressing/Undressing Upper body dressing   What is the patient wearing?: Hospital gown only    Upper body assist Assist Level: Supervision/Verbal cueing    Lower Body Dressing/Undressing Lower body dressing      What is the patient wearing?: Pants     Lower body assist Assist for lower body dressing: Minimal Assistance - Patient > 75%     Toileting Toileting    Toileting assist Assist for toileting: Contact Guard/Touching assist     Transfers Chair/bed transfer  Transfers assist     Chair/bed transfer assist level: Contact Guard/Touching assist     Locomotion Ambulation   Ambulation assist      Assist level: Minimal Assistance - Patient > 75% Assistive device: Walker-rolling Max distance: 157ft   Walk 10 feet activity   Assist     Assist level: Minimal Assistance - Patient > 75% Assistive device: Walker-rolling   Walk 50 feet activity   Assist    Assist level: Minimal Assistance - Patient > 75% Assistive device: Walker-rolling    Walk 150 feet activity   Assist    Assist level: Minimal Assistance - Patient > 75% Assistive device: Walker-rolling    Walk 10 feet on uneven surface  activity   Assist Walk 10 feet on uneven surfaces activity did not occur: Safety/medical concerns         Wheelchair     Assist Will patient use wheelchair at discharge?: No             Wheelchair 50 feet with 2 turns activity    Assist            Wheelchair 150 feet activity     Assist           Medical Problem List and Plan: 1.  Vertigo nausea vomiting secondary to RIght  superior cerebellum infarct with punctate acute infarct in the right dorsal  midbrain with hemorrhagic conversion, and now right PICA infarct as well as history of CVA 2019/loop recorder  Repeat CT  On 5/16 showing -       1. Increased edema associated with the right cerebellar and right dorsal midbrain infarcts with interval development of acute hemorrhage at the site of right cerebellar infarct. Increased resulting mass effect on the fourth ventricle without hydrocephalus.      2. Paranasal sinus mucosal thickening with frothy secretions.  Repeat CT on 5/17 showing new right PICA infarct, repeat on 5/19 showing stability, repeat CT on 5/21 shows worsening/progression of cytotoxic edema from right PICA infarct, hemorrhagic transformation of right SCA infarct, and hydrocephalus, repeat CT later on 5/20 stable.  Repeat head CT on 5/25 showing large right PICA territory infarct with increased hemorrhagic transformation and new intraventricular extension of hemorrhage into fourth ventricle with increasing edema.  Also progressive ventriculomegaly with developing obstructive hydrocephalus.  Repeat CTA on 5/18 showing likely dissection  MRI on 5/18 showing large right PICA infarct and subacute  right MCA infarct with superimposed hemorrhagic transformation.  Leftward midline shift with mass-effect on fourth ventricle, but no hydrocephalus  Appreciate Neuro recs, discussed with Neuro - following closely  May require emergent cerebral catheter angio and revascularization if decompensated   Continue CIR as tolerated, if neurological changes noticed, patient will need emergent transfer to acute floor  Team conference today to discuss current and goals and coordination of care, home and environmental barriers, and discharge planning with nursing, case manager, and therapies. Please see conference note from today as well.  2.  Antithrombotics: -DVT/anticoagulation: Lovenox, will d/c also.              -antiplatelet therapy: Aspirin 81 mg daily d/ced as well due to increasing hemorrhage,  Brilinta d/ced, Plavix initiated, but also DC'd on 5/21 due to progression of hemorrhage 3. Pain Management: Continue Tylenol as needed             Monitor, particularly for headaches with increased exertion  Controlled on 5/25 4. Mood: Provide emotional support             -antipsychotic agents: N/A 5. Neuropsych: This patient is capable of making decisions on her own behalf. 6. Skin/Wound Care: Routine skin checks 7. Fluids/Electrolytes/Nutrition: Routine in and outs 8.  Hypertension.  Patient on Cozaar50 mg daily, Norvasc 5 mg daily prior to admission.    Norvasc 5 started on 5/16, continue             Vitals with BMI 03/10/2021 03/09/2021 03/09/2021  Height - - -  Weight - - -  BMI - - -  Systolic 122 129 505  Diastolic 84 70 70  Pulse 83 84 94   Relatively controlled on 5/25 9. Hyperlipidemia: Lipitor 10. ABLA: Resolved             Hb 12.4 on 5/23, labs ordered for tomorrow  Cont to monitor 12. Transaminitis  AST elevated, but improving on 5/20  Cont to monitor 13. Dysarthria  Continue SLP 14.  Hyponatremia  Sodium 135 on 5/23, labs ordered for tomorrow 15.  Tachycardia  ECG reviewed, no significant change in ECG from prior  LOS: 12 days A FACE TO FACE EVALUATION WAS PERFORMED  Shayann Garbutt Karis Juba 03/10/2021, 10:04 AM

## 2021-03-10 NOTE — Progress Notes (Signed)
Physical Therapy Session Note  Patient Details  Name: Judy Gutierrez MRN: [id number] Date of Birth: [birth year]  Today's Date: 03/10/2021 PT Individual Time: 6283-6629 + 1130-1200 + 4765-4650 PT Individual Time Calculation (min): 58 min  + 30 min + 43 min  Short Term Goals: Week 2:  PT Short Term Goal 1 (Week 2): STG = LTG due to ELOS  Skilled Therapeutic Interventions/Progress Updates:      1st session: Coordinated with RN and PA for verbal confirmation that pt is appropriate for therapies. PA requesting light activity only and to continue to monitor for neuro changes. BP assessed on arrival, reading 115/75.  Pt greeted supine in bed at start of session, agreeable to PT. Reports muscle soreness in hips from yesterday's PT session. Bed mobility completed with supervision. Donned socks and tennis shoes with maxA while she sat EOB for time management. Completed stand<>pivot transfer with minA to w/c. Pt instructed on w/c mobility, using BLE and LUE only. She required minA for propelling ~143f to ortho gym. Focused remainder of session on NMR for RUE ataxia to improve carryover into functional mobility tasks. While seated in w/c, completed: 1) Single Target - User Paced      - unweighted with RUE - 50.71% accuracy with 3.32 reaction time.      - 2lb ankle weight to R wrist - 58.97% accuracy with 4.79 reaction time  2) Rotator Visual Pursuit sequencing      - unweighted with RUE - 41% accuracy with 3.52 reaction time      - 2lb ankle weight to R wrist - 44% accuracy with 4.71 reaction time  Pt propelled in w/c back to her room with supervision with similar technique as above. Stand<>pivot with minA back to bed and completed bed mobilty with supervision. All needs met.   2nd session: Pt received supine in bed, agreeable to PT session. No reports of pain or any neurological changes - motivated to participate. Bed mobility completed with supervision. Donned shoes with totalA for time management.  Stand<>pivot transfer completed with minA and no AD to w/c. Transported in w/c to ortho gym where she completed additional stand<>pivot transfer with minA and no AD. Completed repeated sit<>stand transfers with CGA and no AD - working on truncal ataxia and safety awareness with transitions. Completed standing cone stacking with minA for balance while using RUE to stack cones, ataxia impacting but pt capable. She then worked on cProbation officeractivities for RSUPERVALU INCand pSecretary/administrator- with 5# dowel rod, completed: -resistive push/pull  -resistive paloff press Also completed open chain training with chest press, shldr press with 5# dowel rod.  Stand<>pivot with minA back to w/c and returned to room with tFormanfor time management. Helped back to bed with minA via stand<>pivot and bed mobility completed with supervision. She remained supine in bed with bed alarm on and family at bedside, needs within reach.     3rd session: Pt received supine in bed with family at bedside at start of session. No reports of pain but reports fatigue from busy day of therapies. Focus of session to complete family education/training. Discussed pt's current mobility status, impacts of cerebellar strokes on mobility and function, stroke recovery, home safety training, fall prevention strategies, and energy conservation strategies. Bed mobility completed with supervision. Donned her tennis shoes with totalA for time management while she sat EOB. Completed stand<>pivot transfer with CGA/minA and no AD. Educated family and pt on functions and mechanics of w/c and to always ensure brakes  are locked prior to transfers. Pt propelled herself ~119f with minA using BLE's and her LUE on level ground. Completed stair training where she navigated up/down 8 steps with minA and 1 handrail on L with PT and then repeated the same with her sister providing assist and therapist providing close supervision. Pt and family report  feeling confident and comfortable with stairs. Next, reviewed car transfers which she completed with CGA and RW - car height set to simulate their sedan and pt completed without much difficulty. Completed gait training 1532f+ 17569fith sister providing minA guard and therapist providing CGA for safety. Educated sister on hand placement and guarding technique and to always be prepared to assist LOB as pt is very inconsistent with gait patterns due to ataxia. Completed furniture transfers on low sitting sofa couch with CGA and RW and also completed bed mobility on regular flat bed with supervision. Pt ended session supine in bed with family at bedside and needs in reach. All questions and concerns addressed, family and pt appreciative. Will continue to integrate family education during therapy sessions with focus on guarding during gait training.   Therapy Documentation Precautions:  Precautions Precautions: Fall Precaution Comments: Diplopia, trunk/RUE/LE ataxic. Systolic BP <16<254strictions Weight Bearing Restrictions: No General:    Therapy/Group: Individual Therapy  Nahuel Wilbert P Jezreel Sisk PT 03/10/2021, 7:35 AM

## 2021-03-10 NOTE — Progress Notes (Signed)
Patient ID: Judy Gutierrez, female   DOB: 1961/08/05, 60 y.o.   MRN: [id number]  This SW covering for primary SW, [place].   SW ordered wheelchair with [place] via parachute.   SW met with pt, pt husband Ajay, and pt sister to provide updates from team conference on change in her d/c date from 5/26 to 5/31 due to concerns about CT scan findings, and will have repeat CT scan on Sunday. Pt husband would like attending to call him. SW also informed on DME ordered.   SW shared with attending pt husband's request for follow-up.   Loralee Pacas, MSW, [place] Office: [phone] Cell: [phone] Fax: [phone]

## 2021-03-10 NOTE — Progress Notes (Signed)
Occupational Therapy Session Note  Patient Details  Name: Judy Gutierrez MRN: [id number] Date of Birth: 1961/08/08  Today's Date: 03/10/2021 OT Individual Time: 2820-6015 OT Individual Time Calculation (min): 47 min    Short Term Goals: Week 1:  OT Short Term Goal 1 (Week 1): Pt will complete toileting tasks at CGA level OT Short Term Goal 1 - Progress (Week 1): Met OT Short Term Goal 2 (Week 1): Pt will don pants with CGA OT Short Term Goal 2 - Progress (Week 1): Met OT Short Term Goal 3 (Week 1): Pt will utilize appropriate visual compensation strategies during ADL routine with min cueing OT Short Term Goal 3 - Progress (Week 1): Met OT Short Term Goal 4 (Week 1): Pt will transfer to walk in shower with min A OT Short Term Goal 4 - Progress (Week 1): Met Week 2:  OT Short Term Goal 2 (Week 2): STGs= LTGs  Skilled Therapeutic Interventions/Progress Updates:    Pt and family received in room and consented to OT tx. Session focused on family training during ADLs and functional mobility with functional transfers. Demo'd toileting transfers and tasks for sister, educated in safe use of RW and use of gait belt for safety 2/2 pt's impaired balance and ataxic movements during standing tasks. Encouraged sister to hold onto gait belt during session while therapist stood by to guard. Pt required CGA for all aspects of toileting, completed LB dressing with CGA while sitting EOB and standing with RW. UBD was completed with setup. Pt then taken to walk in shower simulation, set up like home shower. Educated pt and family in shower transfer with RW and shower chair. Pt demo'd once with therapist, and again with sister holding onto gait belt and therapist guarding. Pt required cuing to keep RW close by and to not cross legs when turning, rather to take small steps in order to maintain balance. After tx, pt and family helped back to room and left with all needs met and no questions for OT.  Therapy  Documentation Precautions:  Precautions Precautions: Fall Precaution Comments: Diplopia, trunk/RUE/LE ataxic. Systolic BP <615 Restrictions Weight Bearing Restrictions: No Pain: none     Therapy/Group: Individual Therapy  Tyronn Golda 03/10/2021, 1:55 PM

## 2021-03-11 LAB — CBC WITH DIFFERENTIAL/PLATELET
Abs Immature Granulocytes: 0.01 10*3/uL (ref 0.00–0.07)
Basophils Absolute: 0 10*3/uL (ref 0.0–0.1)
Basophils Relative: 0 %
Eosinophils Absolute: 0.3 10*3/uL (ref 0.0–0.5)
Eosinophils Relative: 5 %
HCT: 35.7 % — ABNORMAL LOW (ref 36.0–46.0)
Hemoglobin: 11.9 g/dL — ABNORMAL LOW (ref 12.0–15.0)
Immature Granulocytes: 0 %
Lymphocytes Relative: 41 %
Lymphs Abs: 2.2 10*3/uL (ref 0.7–4.0)
MCH: 28.8 pg (ref 26.0–34.0)
MCHC: 33.3 g/dL (ref 30.0–36.0)
MCV: 86.4 fL (ref 80.0–100.0)
Monocytes Absolute: 0.5 10*3/uL (ref 0.1–1.0)
Monocytes Relative: 9 %
Neutro Abs: 2.4 10*3/uL (ref 1.7–7.7)
Neutrophils Relative %: 45 %
Platelets: 293 10*3/uL (ref 150–400)
RBC: 4.13 MIL/uL (ref 3.87–5.11)
RDW: 12.6 % (ref 11.5–15.5)
WBC: 5.3 10*3/uL (ref 4.0–10.5)
nRBC: 0 % (ref 0.0–0.2)

## 2021-03-11 LAB — BASIC METABOLIC PANEL
Anion gap: 8 (ref 5–15)
BUN: 12 mg/dL (ref 6–20)
CO2: 27 mmol/L (ref 22–32)
Calcium: 9 mg/dL (ref 8.9–10.3)
Chloride: 102 mmol/L (ref 98–111)
Creatinine, Ser: 0.65 mg/dL (ref 0.44–1.00)
GFR, Estimated: >60 mL/min (ref >60)
Glucose, Bld: 117 mg/dL — ABNORMAL HIGH (ref 70–99)
Potassium: 3.8 mmol/L (ref 3.5–5.1)
Sodium: 137 mmol/L (ref 135–145)

## 2021-03-11 LAB — HEPATIC FUNCTION PANEL
ALT: 30 U/L (ref 0–44)
AST: 36 U/L (ref 15–41)
Albumin: 3.2 g/dL — ABNORMAL LOW (ref 3.5–5.0)
Alkaline Phosphatase: 61 U/L (ref 38–126)
Bilirubin, Direct: 0.2 mg/dL (ref 0.0–0.2)
Indirect Bilirubin: 0.9 mg/dL (ref 0.3–0.9)
Total Bilirubin: 1.1 mg/dL (ref 0.3–1.2)
Total Protein: 7.1 g/dL (ref 6.5–8.1)

## 2021-03-11 NOTE — Progress Notes (Signed)
PROGRESS NOTE   Subjective/Complaints: Patient seen sitting up in bed this morning.  She states she slept well overnight.  She has questions regarding discharge date as well as angiogram.  She notes improvement in vision.  She was seen by neurology yesterday, notes reviewed-plan to DC aspirin and continue to monitor.  ROS: Denies CP, SOB, N/V/D  Objective:   CT Head Wo Contrast  Result Date: 03/10/2021 CLINICAL DATA:  Mar 06, 2021. EXAM: CT HEAD WITHOUT CONTRAST TECHNIQUE: Contiguous axial images were obtained from the base of the skull through the vertex without intravenous contrast. COMPARISON:  CT head Mar 06, 2021. FINDINGS: Brain: Large area of hypodensity involving the inferior right cerebellum, compatible with PICA territory infarct. Increased acute hemorrhage associated with the right superior cerebellar infarct, compatible with worsening hemorrhagic transformation. There is new intraventricular extension of hemorrhage into the fourth ventricle (series 3, image 9 and series 6, image 31). There is persistent mass effect on the fourth ventricle which is effaced and displaced to the left. Increased edema in the superior right cerebellum surrounding the hemorrhage. There is increasing ventriculomegaly with increased rounding of the temporal horns and outwardly convex third ventricle, compatible with worsening hydrocephalus. No supratentorial midline shift. No definite interval/new large vascular territory infarct. Vascular: No hyperdense vessel identified. Calcific atherosclerosis. Skull: No acute fracture. Sinuses/Orbits: Paranasal sinus mucosal thickening with air-fluid levels in the left maxillary and left sphenoid sinuses. Right ethmoid osteoma. Other: Clear mastoid air cells. IMPRESSION: 1. 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 Increased edema in the superior right cerebellum surrounding the hemorrhage. 2. Progressive ventriculomegaly, compatible with developing obstructive hydrocephalus. These results will be called to the ordering clinician or representative by the Radiologist Assistant, and communication documented in the PACS or [employer]. Electronically Signed   By: Feliberto Harts MD   On: 03/10/2021 07:33   Recent Labs    03/08/21 0945 03/11/21 0500  WBC 6.0 5.3  HGB 12.4 11.9*  HCT 36.8 35.7*  PLT 299 293   Recent Labs    03/08/21 0945 03/11/21 0500  NA 135 137  K 3.9 3.8  CL 102 102  CO2 25 27  GLUCOSE 134* 117*  BUN 15 12  CREATININE 0.58 0.65  CALCIUM 9.0 9.0    Intake/Output Summary (Last 24 hours) at 03/11/2021 0841 Last data filed at 03/10/2021 1215 Gross per 24 hour  Intake 120 ml  Output --  Net 120 ml        Physical Exam: Vital Signs Blood pressure 111/76, pulse 85, temperature 98.4 F (36.9 C), resp. rate 18, height 5\' 2"  (1.575 m), weight 58.1 kg, last menstrual period 12/08/2010, SpO2 97 %.  Constitutional: No distress . Vital signs reviewed. HENT: Normocephalic.  Atraumatic. Eyes: EOMI. No discharge. Cardiovascular: No JVD.  RRR. Respiratory: Normal effort.  No stridor.  Bilateral clear to auscultation. GI: Non-distended.  BS +. Skin: Warm and dry.  Intact. Psych: Normal mood.  Normal behavior. Musc: No edema in extremities.  No tenderness in extremities. Neuro: Alert Motor: 5/5 throughout RUE dysmetria and  ataxia, stable LUE dysmetria improving Dysarthria, stable  Assessment/Plan: 1. Functional deficits which require 3+ hours per day of interdisciplinary therapy in a comprehensive inpatient rehab setting.  Physiatrist is providing close team supervision and 24 hour management of active medical problems listed below.  Physiatrist and rehab team continue to assess barriers to  discharge/monitor patient progress toward functional and medical goals  Care Tool:  Bathing    Body parts bathed by patient: Right arm,Right lower leg,Left arm,Chest,Abdomen,Front perineal area,Buttocks,Left upper leg,Left lower leg,Face,Right upper leg         Bathing assist Assist Level: Supervision/Verbal cueing     Upper Body Dressing/Undressing Upper body dressing   What is the patient wearing?: Hospital gown only    Upper body assist Assist Level: Supervision/Verbal cueing    Lower Body Dressing/Undressing Lower body dressing      What is the patient wearing?: Pants     Lower body assist Assist for lower body dressing: Minimal Assistance - Patient > 75%     Toileting Toileting    Toileting assist Assist for toileting: Contact Guard/Touching assist     Transfers Chair/bed transfer  Transfers assist     Chair/bed transfer assist level: Minimal Assistance - Patient > 75%     Locomotion Ambulation   Ambulation assist      Assist level: Minimal Assistance - Patient > 75% Assistive device: Walker-rolling Max distance: 154ft   Walk 10 feet activity   Assist     Assist level: Minimal Assistance - Patient > 75% Assistive device: Walker-rolling   Walk 50 feet activity   Assist    Assist level: Minimal Assistance - Patient > 75% Assistive device: Walker-rolling    Walk 150 feet activity   Assist    Assist level: Minimal Assistance - Patient > 75% Assistive device: Walker-rolling    Walk 10 feet on uneven surface  activity   Assist Walk 10 feet on uneven surfaces activity did not occur: Safety/medical concerns   Assist level: Minimal Assistance - Patient > 75% Assistive device: Photographer Will patient use wheelchair at discharge?: Yes Type of Wheelchair: Manual    Wheelchair assist level: Minimal Assistance - Patient > 75% Max wheelchair distance: 200    Wheelchair 50 feet with 2 turns  activity    Assist        Assist Level: Minimal Assistance - Patient > 75%   Wheelchair 150 feet activity     Assist      Assist Level: Minimal Assistance - Patient > 75%    Medical Problem List and Plan: 1.  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 Increased edema associated with the right cerebellar and right dorsal midbrain infarcts with interval development of acute hemorrhage at the site of right cerebellar infarct. Increased resulting mass effect on the fourth ventricle without hydrocephalus.      2. Paranasal sinus mucosal thickening with frothy secretions.  Repeat CT on 5/17 showing new right PICA infarct, repeat on 5/19 showing stability, repeat CT on 5/21 shows worsening/progression of cytotoxic edema from right PICA infarct, hemorrhagic transformation of right SCA infarct, and hydrocephalus, repeat CT later on 5/20 stable.   Repeat head CT on 5/25 showing large right  PICA territory infarct with increased hemorrhagic transformation and new intraventricular extension of hemorrhage into fourth ventricle with increasing edema.  Also progressive ventriculomegaly with developing obstructive hydrocephalus.  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  Leftward midline shift with mass-effect on fourth ventricle, but no hydrocephalus  Appreciate Neuro recs, discussed with Neuro - following closely  May require emergent cerebral catheter angio and revascularization vs. ventriculostomy versus decompression if decompensated   Continue CIR, if neurological changes noticed, patient will need emergent  transfer to acute floor 2.  Antithrombotics: -DVT/anticoagulation: Lovenox, d/ced also.              -antiplatelet therapy: Aspirin 81 mg daily d/ced as well due to increasing hemorrhage, Brilinta d/ced, Plavix initiated, but also DC'd on 5/21 due to progression of hemorrhage 3. Pain Management: Continue Tylenol as needed             Monitor, particularly for headaches with increased exertion  Controlled on 5/26 4. Mood: Provide emotional support             -antipsychotic agents: N/A 5. Neuropsych: This patient is capable of making decisions on her own behalf. 6. Skin/Wound Care: Routine skin checks 7. Fluids/Electrolytes/Nutrition: Routine in and outs 8.  Hypertension.  Patient on Cozaar50 mg daily, Norvasc 5 mg daily prior to admission.    Norvasc 5 started on 5/16, continue             Vitals with BMI 03/11/2021 03/10/2021 03/10/2021  Height - - -  Weight - - -  BMI - - -  Systolic 111 130 341  Diastolic 76 76 84  Pulse 85 90 83   Controlled on 5/26 9. Hyperlipidemia: Lipitor 10. ABLA: Resolved             Hb 11.9 on 5/26  Cont to monitor 12. Transaminitis  AST elevated, but improving on 5/20, labs pending  Cont to monitor 13. Dysarthria  Continue SLP 14.  Hyponatremia: Resolved  Sodium 137 on 5/26 15.  Tachycardia  ECG reviewed, no significant change in ECG from prior  Improved  LOS: 13 days A FACE TO FACE EVALUATION WAS PERFORMED  Hoa Deriso Karis Juba 03/11/2021, 8:41 AM

## 2021-03-11 NOTE — Progress Notes (Signed)
STROKE TEAM PROGRESS NOTE   SUBJECTIVE (INTERVAL HISTORY) Patient is lying comfortably in bed.  She continues to do clinically quite well despite her CT scan findings.  She denies any headache, nausea or new complaints.  Patient`s  neurological exam is unchanged, still has mild diplopia and right arm incoordination but appears to be improving..       Vital signs are stable.  Repeat CT planned for tomorrow morning. OBJECTIVE Temp:  [98.4 F (36.9 C)] 98.4 F (36.9 C) (05/26 0505) Pulse Rate:  [85-90] 85 (05/26 0505) Resp:  [17-18] 18 (05/26 0505) BP: (111-130)/(76) 111/76 (05/26 0505) SpO2:  [97 %-100 %] 97 % (05/26 0505)  No results for input(s): GLUCAP in the last 168 hours. Recent Labs  Lab 03/05/21 0615 03/08/21 0945 03/11/21 0500  NA 134* 135 137  K 3.9 3.9 3.8  CL 104 102 102  CO2 25 25 27   GLUCOSE 116* 134* 117*  BUN 10 15 12   CREATININE 0.63 0.58 0.65  CALCIUM 8.8* 9.0 9.0   Recent Labs  Lab 03/05/21 0615 03/11/21 0500  AST 45* 36  ALT 40 30  ALKPHOS 72 61  BILITOT 0.8 1.1  PROT 7.5 7.1  ALBUMIN 3.4* 3.2*   Recent Labs  Lab 03/08/21 0945 03/11/21 0500  WBC 6.0 5.3  NEUTROABS 3.6 2.4  HGB 12.4 11.9*  HCT 36.8 35.7*  MCV 86.8 86.4  PLT 299 293   No results for input(s): CKTOTAL, CKMB, CKMBINDEX, TROPONINI in the last 168 hours. No results for input(s): LABPROT, INR in the last 72 hours. No results for input(s): COLORURINE, LABSPEC, PHURINE, GLUCOSEU, HGBUR, BILIRUBINUR, KETONESUR, PROTEINUR, UROBILINOGEN, NITRITE, LEUKOCYTESUR in the last 72 hours.  Invalid input(s): APPERANCEUR     Component Value Date/Time   CHOL 105 02/23/2021 0745   CHOL 191 09/09/2015 0804   TRIG 28 02/23/2021 0745   HDL 48 02/23/2021 0745   HDL 60 09/09/2015 0804   CHOLHDL 2.2 02/23/2021 0745   VLDL 6 02/23/2021 0745   LDLCALC 51 02/23/2021 0745   LDLCALC 120 (H) 09/09/2015 0804   Lab Results  Component Value Date   HGBA1C 6.0 (H) 02/23/2021      Component Value  Date/Time   LABOPIA NONE DETECTED 02/23/2021 0033   COCAINSCRNUR NONE DETECTED 02/23/2021 0033   LABBENZ NONE DETECTED 02/23/2021 0033   AMPHETMU NONE DETECTED 02/23/2021 0033   THCU NONE DETECTED 02/23/2021 0033   LABBARB NONE DETECTED 02/23/2021 0033    No results for input(s): ETH in the last 168 hours.  I have personally reviewed the radiological images below and agree with the radiology interpretations.  CT ANGIO HEAD NECK W WO CM  Result Date: 03/03/2021 CLINICAL DATA:  Stroke follow-up. EXAM: CT ANGIOGRAPHY HEAD AND NECK TECHNIQUE: Multidetector CT imaging of the head and neck was performed using the standard protocol during bolus administration of intravenous contrast. Multiplanar CT image reconstructions and MIPs were obtained to evaluate the vascular anatomy. Carotid stenosis measurements (when applicable) are obtained utilizing NASCET criteria, using the distal internal carotid diameter as the denominator. CONTRAST:  64mL OMNIPAQUE IOHEXOL 350 MG/ML SOLN COMPARISON:  CT angiogram of the head and neck Feb 23, 2021. FINDINGS: CTA NECK FINDINGS Aortic arch: 4 vessel aortic arch with direct origin of the left vertebral artery from the aortic arch in. Imaged portion shows no evidence of aneurysm or dissection. No significant stenosis of the major arch vessel origins. Right carotid system: Minimal atherosclerotic changes of the right carotid bifurcation without hemodynamically significant stenosis.  Left carotid system: Mild atherosclerotic changes of the left carotid bifurcation without hemodynamically significant stenosis. Vertebral arteries: Right dominant. The non dominant left vertebral artery has normal course and caliber from the aortic arch to the vertebrobasilar junction. Area of luminal irregularity is again seen in the V3 segment of the right vertebral artery now with mild stenosis followed by small (1-2 mm) outpouching suggesting pseudoaneurysm. There is also luminal irregularity at the  V4 segment. Skeleton: Negative. Other neck: Negative. Upper chest: Negative. Review of the MIP images confirms the above findings CTA HEAD FINDINGS Anterior circulation: No significant stenosis, proximal occlusion, aneurysm, or vascular malformation. Posterior circulation: Mild luminal irregularity of the V4 segment of the right vertebral artery which involves the origin of the right PICA which appear patent. The basilar artery is small in caliber without area of focal stenosis, likely related to the presence of bilateral prominent posterior communicating arteries. The right P1/PCA segment is absent (fetal PCA). Interval recanalization of the right superior cerebellar artery. Venous sinuses: As permitted by contrast timing, patent. Anatomic variants: Right fetal PCA. Prominent left posterior communicating artery. Review of the MIP images confirms the above findings IMPRESSION: 1. Luminal irregularity in the V3 and V4 segments of the right vertebral artery is suggestive of dissection without hemodynamically significant stenosis. Area of irregularity of the origin of the right PICA which remains patent. There is a new diminutive outpouching from the right V3 segment suggestive of a small pseudoaneurysm. 2. Interval recanalization of the right superior cerebellar artery. 3. No significant findings in the anterior circulation. Electronically Signed   By: Baldemar Lenis M.D.   On: 03/03/2021 12:54   CT Head Wo Contrast  Result Date: 03/10/2021 CLINICAL DATA:  Mar 06, 2021. EXAM: CT HEAD WITHOUT CONTRAST TECHNIQUE: Contiguous axial images were obtained from the base of the skull through the vertex without intravenous contrast. COMPARISON:  CT head Mar 06, 2021. FINDINGS: Brain: Large area of hypodensity involving the inferior right cerebellum, compatible with PICA territory infarct. Increased acute hemorrhage associated with the right superior cerebellar infarct, compatible with worsening hemorrhagic  transformation. There is new intraventricular extension of hemorrhage into the fourth ventricle (series 3, image 9 and series 6, image 31). There is persistent mass effect on the fourth ventricle which is effaced and displaced to the left. Increased edema in the superior right cerebellum surrounding the hemorrhage. There is increasing ventriculomegaly with increased rounding of the temporal horns and outwardly convex third ventricle, compatible with worsening hydrocephalus. No supratentorial midline shift. No definite interval/new large vascular territory infarct. Vascular: No hyperdense vessel identified. Calcific atherosclerosis. Skull: No acute fracture. Sinuses/Orbits: Paranasal sinus mucosal thickening with air-fluid levels in the left maxillary and left sphenoid sinuses. Right ethmoid osteoma. Other: Clear mastoid air cells. IMPRESSION: 1. 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 Increased edema in the superior right cerebellum surrounding the hemorrhage. 2. Progressive ventriculomegaly, compatible with developing obstructive hydrocephalus. These results will be called to the ordering clinician or representative by the Radiologist Assistant, and communication documented in the PACS or [employer]. Electronically Signed   By: Feliberto Harts Judy Gutierrez   On: 03/10/2021 07:33   CT HEAD WO CONTRAST  Result Date: 03/06/2021 CLINICAL DATA:  Stroke follow-up EXAM: CT HEAD WITHOUT CONTRAST TECHNIQUE: Contiguous axial images were obtained from the base of the skull through the vertex without intravenous contrast. COMPARISON:  CT  head 03/16/2021 FINDINGS: Brain: Large hypodensity right inferior cerebellum compatible with PICA infarct unchanged. 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 There is  mass-effect on the fourth ventricle which is effaced and mildly displaced to the left. Mild ventricular enlargement is present unchanged from earlier today but progressed since 02/23/2021 Chronic infarct left superior cerebellum unchanged. Vascular: Negative for hyperdense vessel Skull: Negative Sinuses/Orbits: Mucosal edema paranasal sinuses. Air-fluid level left sphenoid sinus. Osteoma right ethmoid sinus. Mastoid clear bilaterally. Other: None IMPRESSION: Stable CT head. 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 Electronically Signed   By: Marlan Palau M.D.   On: 03/06/2021 15:56   CT HEAD WO CONTRAST  Addendum Date: 03/06/2021   ADDENDUM REPORT: 03/06/2021 07:54 ADDENDUM: Study discussed by telephone with Dr. Iver Nestle on 03/06/2021 at 0611 hours. Electronically Signed   By: Odessa Fleming M.D.   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 Subsequent encounter. EXAM: CT HEAD WITHOUT CONTRAST TECHNIQUE: Contiguous axial images were obtained from the base of the skull through the vertex without intravenous contrast. COMPARISON:  Head CT 03/04/2021 and earlier. FINDINGS: Brain: Lobular hyperdense hemorrhage has progressed in the right SCA territory since 03/04/2021, with increasing regional edema and mass effect compatible with progressive hemorrhagic transformation (coronal image 46). The volume of hyperdense blood there is approximately 3 mL. Confluent right PICA infarct with cytotoxic edema which has mildly progressed since 03/03/2021. Combined, there is mild progressive basilar cistern effacement, although the cisterna magna remains patent. Fourth ventricle remains effaced. Since 03/03/2021 lateral and 3rd ventricle size is slightly increased (series 3, image 14  today versus series 3, image 17 on 03/03/2021). No supratentorial transependymal edema. Supratentorial gray-white matter differentiation preserved with no midline shift. No supratentorial hemorrhage. Vascular: Mild Calcified atherosclerosis at the skull base. Skull: Stable. Small benign right frontal bone exostosis on series 4, image 41. Sinuses/Orbits: Stable paranasal sinuses. Tympanic cavities and mastoids remain clear. Other: Stable leftward gaze deviation. Visualized scalp soft tissues are within normal limits. IMPRESSION: 1. 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 2. At the same time cytotoxic edema from the large right PICA infarct has mildly progressed. 3. Subsequent increased posterior fossa mass although the pre medullary cistern and cisterna magna remain patent. 4. There has been subtle enlargement of the lateral and 3rd ventricles since 03/03/2021, but there is no transependymal edema. Electronically Signed: By: Odessa Fleming M.D. On: 03/06/2021 06:05   CT HEAD WO CONTRAST  Result Date: 03/04/2021 CLINICAL DATA:  60 year old female with new right PICA infarct superimposed on recent right SCA infarct. EXAM: CT HEAD WITHOUT CONTRAST TECHNIQUE: Contiguous axial images were obtained from the base of the skull through the vertex without intravenous contrast. COMPARISON:  Brain MRI, CTA head and neck, CT head yesterday. FINDINGS: Brain: Supratentorial gray-white matter differentiation remains stable. And the lateral and 3rd ventricle size and configuration is stable with temporal horns remaining diminutive on series 3, image 9. Subacute right SCA infarct with petechial hemorrhage and more acute confluent right PICA infarct with cytotoxic edema redemonstrated. Stable posterior fossa mass effect including on the 4th ventricle. Superimposed chronic left SCA cerebellar infarct. Posterior fossa gray-white matter  differentiation and basilar cisterns are stable since 0645 hours yesterday. Vascular: Mild  Calcified atherosclerosis at the skull base. Skull: Stable.  No acute osseous abnormality identified. Sinuses/Orbits: Stable occasional sinus mucosal thickening and bubbly opacity. Tympanic cavities and mastoids remain clear. Other: Leftward gaze deviation as before. Visualized scalp soft tissues are within normal limits. IMPRESSION: 1. Stable since yesterday. Confluent Right PICA infarct superimposed on subacute Right SCA infarct with petechial hemorrhage. Stable posterior fossa mass effect. No lateral or 3rd ventriculomegaly at this time. 2. No new intracranial abnormality. Electronically Signed   By: Odessa Fleming M.D.   On: 03/04/2021 06:08   CT HEAD WO CONTRAST  Addendum Date: 03/03/2021   ADDENDUM REPORT: 03/03/2021 08:53 ADDENDUM: Study discussed by telephone with Dr. [name] on 03/03/2021 at 0847 hours. Electronically Signed   By: Odessa Fleming M.D.   On: 03/03/2021 08:53   Result Date: 03/03/2021 CLINICAL DATA:  60 year old female with right SCA cerebellar infarct on MRI 02/23/2021. Subsequent encounter. EXAM: CT HEAD WITHOUT CONTRAST TECHNIQUE: Contiguous axial images were obtained from the base of the skull through the vertex without intravenous contrast. COMPARISON:  Head CT 03/01/2021 and earlier. FINDINGS: Brain: Right superior cerebellar artery territory and cerebellar vermis cytotoxic edema with petechial hemorrhage. But new confluent cytotoxic edema now in the right PICA territory (series 3, image 5). Subsequent increased posterior fossa mass effect, with decreased prepontine and pre medullary cisterns. No tonsillar herniation. Increased effacement of the 4th ventricle, but lateral and 3rd ventricle size not significantly changed at this time. Supratentorial gray and white matter signal is stable. No new intracranial hemorrhage. Vascular: Mild Calcified atherosclerosis at the skull base. No suspicious intracranial  vascular hyperdensity. Skull: No acute osseous abnormality identified. Small benign right frontal bone exostosis on series 4, image 59. Sinuses/Orbits: Stable mucosal thickening and bubbly opacity in the left sphenoid, left maxillary, and posterior right ethmoid sinuses. Tympanic cavities and mastoids remain clear. Other: Leftward gaze deviation. Visualized scalp soft tissues are within normal limits. IMPRESSION: 1. Confluent acute right PICA infarct is new since 03/01/2021, superimposed on the recent right SCA infarct. Subsequent increased cytotoxic edema in the right cerebellum with increased posterior fossa mass effect including effaced 4th ventricle. But no malignant hemorrhagic transformation or ventriculomegaly at this time. 2. Supratentorial brain appears stable, negative. Electronically Signed: By: Odessa Fleming M.D. On: 03/03/2021 08:42   CT HEAD WO CONTRAST  Result Date: 03/01/2021 CLINICAL DATA:  Stroke follow-up. EXAM: CT HEAD WITHOUT CONTRAST TECHNIQUE: Contiguous axial images were obtained from the base of the skull through the vertex without intravenous contrast. COMPARISON:  CT head and MRI Feb 23, 2021. FINDINGS: Brain: Increased edema associated with the right cerebellar and right dorsal midbrain infarct. Increased resulting mass effect on the fourth ventricle without hydrocephalus. Interval development of acute hemorrhage at the site of right cerebellar infarct. No evidence of new/interval acute large vascular territory infarct. Remote left cerebellar infarct. Basal cisterns are patent. No inferior cerebellar tonsillar herniation or midline shift. No mass lesion. Vascular: No hyperdense vessel identified. Skull: No acute fracture. Sinuses/Orbits: Mucosal thickening the left maxillary sinus, right posterior ethmoid air cell and left sphenoid sinus with frothy secretions in the sinuses. Unremarkable orbits. Other: No mastoid effusions. IMPRESSION: 1. Increased edema associated with the right  cerebellar and right dorsal midbrain infarcts with interval development of acute hemorrhage at the site of right cerebellar infarct. Increased resulting mass effect on the fourth ventricle without hydrocephalus. 2. Paranasal sinus mucosal thickening with frothy secretions, as detailed above. Electronically Signed   By: Feliberto Harts Judy Gutierrez  On: 03/01/2021 14:39   MR BRAIN WO CONTRAST  Result Date: 03/03/2021 CLINICAL DATA:  Stroke follow-up EXAM: MRI HEAD WITHOUT CONTRAST TECHNIQUE: Multiplanar, multiecho pulse sequences of the brain and surrounding structures were obtained without intravenous contrast. COMPARISON:  Head CT Mar 03, 2021. FINDINGS: Brain: Large area of restricted diffusion involving the inferior right cerebellar hemisphere consistent with acute/subacute right PICA territory infarct. Patchy T1 hyperintensity with prominent susceptibility artifact in the superior aspect of the right cerebellar hemisphere with surrounding vasogenic edema is suggestive of hemorrhagic transformation of non prior right SCA territory infarct. Edema and mass effect from these 2 areas of infarcts resulting in leftward midline shift with mass effect on the fourth ventricle which remains patent as well as mass effect on the posterior aspect of the pons and mid brain with mild periaqueductal edema and small ascending transtentorial herniation. No tonsillar herniation. No hydrocephalus. Overall, findings are similar to prior CT performed earlier today Area of encephalomalacia and gliosis with hemosiderin staining in the superior left cerebellar hemisphere consistent with chronic left SCA territory infarct. Vascular: Normal flow voids. Skull and upper cervical spine: Normal marrow signal. Sinuses/Orbits: Mucosal thickening with bubbly secretion in the left maxillary and sphenoid sinuses. The orbits are maintained. Other: Right mastoid effusion. IMPRESSION: Large acute/subacute right PICA territory infarct and late subacute  right SCA territory infarct with superimposed hemorrhagic transformation. Edema from of right cerebellar hemisphere infarcts results in leftward midline shift with mass effect on the fourth ventricle, mid brain and pons without evidence of hydrocephalus. Findings appear stable compared to head CT performed earlier today. Electronically Signed   By: Baldemar Lenis M.D.   On: 03/03/2021 13:13   MR BRAIN WO CONTRAST  Result Date: 02/23/2021 CLINICAL DATA:  Dizziness, nausea and vomiting. EXAM: MRI HEAD WITHOUT CONTRAST TECHNIQUE: Multiplanar, multiecho pulse sequences of the brain and surrounding structures were obtained without intravenous contrast. COMPARISON:  CT studies same day FINDINGS: Brain: Diffusion imaging shows a 4-5 cm region of acute infarction within the superior cerebellum on the right. Acute infarction also within the dorsal right midbrain. Old infarction within the left superior cerebellum. Mild swelling but no acute hemorrhage in the right cerebellum. Old hemosiderin residual within the left cerebellar stroke. Cerebral hemispheres are normal. No mass, hydrocephalus or extra-axial collection. Vascular: No acute vascular finding by standard MRI. Skull and upper cervical spine: Negative Sinuses/Orbits: Mucosal inflammatory changes of the left maxillary and sphenoid sinuses. Orbits negative. Other: None IMPRESSION: 4-5 cm region of acute infarction within the superior cerebellum on the right. Punctate acute infarction in the right dorsal midbrain. Mild swelling. No evidence of acute hemorrhage. Old infarction in the left superior cerebellum with atrophy, encephalomalacia and gliosis and hemosiderin deposition. Electronically Signed   By: Paulina Fusi M.D.   On: 02/23/2021 08:42   ECHOCARDIOGRAM COMPLETE  Result Date: 02/23/2021    ECHOCARDIOGRAM REPORT   Patient Name:   [facility] Date of Exam: 02/23/2021 Medical Rec #:  [id number]   Height:       62.0 in Accession #:    [id number]   Weight:       136.9 lb Date of Birth:  January 07, 1961  BSA:          1.627 m Patient Age:    59 years    BP:           135/72 mmHg Patient Gender: F           HR:  64 bpm. Exam Location:  Inpatient Procedure: 2D Echo, Cardiac Doppler and Color Doppler Indications:    Stroke  History:        Patient has prior history of Echocardiogram examinations, most                 recent 01/30/2020. Stroke; Risk Factors:Hypertension.  Sonographer:    Shirlean Kelly Referring Phys: [id number] CAROLE N HALL IMPRESSIONS  1. Left ventricular ejection fraction, by estimation, is 60 to 65%. The left ventricle has normal function. The left ventricle has no regional wall motion abnormalities. Left ventricular diastolic parameters are consistent with Grade I diastolic dysfunction (impaired relaxation).  2. Right ventricular systolic function is normal. The right ventricular size is normal.  3. Left atrial size was mildly dilated.  4. The mitral valve is normal in structure. No evidence of mitral valve regurgitation. No evidence of mitral stenosis.  5. The aortic valve is normal in structure. Aortic valve regurgitation is trivial. No aortic stenosis is present.  6. The inferior vena cava is normal in size with greater than 50% respiratory variability, suggesting right atrial pressure of 3 mmHg. Comparison(s): No significant change from prior study. Prior images reviewed side by side. FINDINGS  Left Ventricle: Left ventricular ejection fraction, by estimation, is 60 to 65%. The left ventricle has normal function. The left ventricle has no regional wall motion abnormalities. The left ventricular internal cavity size was normal in size. There is  no left ventricular hypertrophy. Left ventricular diastolic parameters are consistent with Grade I diastolic dysfunction (impaired relaxation). Indeterminate filling pressures. Right Ventricle: The right ventricular size is normal. No increase in right ventricular wall thickness. Right  ventricular systolic function is normal. Left Atrium: Left atrial size was mildly dilated. Right Atrium: Right atrial size was normal in size. Pericardium: There is no evidence of pericardial effusion. Mitral Valve: The mitral valve is normal in structure. No evidence of mitral valve regurgitation. No evidence of mitral valve stenosis. Tricuspid Valve: The tricuspid valve is normal in structure. Tricuspid valve regurgitation is trivial. No evidence of tricuspid stenosis. Aortic Valve: The aortic valve is normal in structure. Aortic valve regurgitation is trivial. No aortic stenosis is present. Aortic valve mean gradient measures 3.0 mmHg. Aortic valve peak gradient measures 5.9 mmHg. Aortic valve area, by VTI measures 2.32 cm. Pulmonic Valve: The pulmonic valve was normal in structure. Pulmonic valve regurgitation is not visualized. No evidence of pulmonic stenosis. Aorta: The aortic root is normal in size and structure. Venous: The inferior vena cava is normal in size with greater than 50% respiratory variability, suggesting right atrial pressure of 3 mmHg. IAS/Shunts: No atrial level shunt detected by color flow Doppler.  LEFT VENTRICLE PLAX 2D LVIDd:         3.90 cm  Diastology LVIDs:         2.50 cm  LV e' medial:    7.18 cm/s LV PW:         1.10 cm  LV E/e' medial:  11.9 LV IVS:        1.10 cm  LV e' lateral:   8.38 cm/s LVOT diam:     1.80 cm  LV E/e' lateral: 10.2 LV SV:         54 LV SV Index:   33 LVOT Area:     2.54 cm  RIGHT VENTRICLE             IVC RV S prime:     18.40 cm/s  IVC diam: 1.20 cm TAPSE (M-mode): 3.0 cm LEFT ATRIUM             Index       RIGHT ATRIUM           Index LA diam:        3.80 cm 2.34 cm/m  RA Area:     12.80 cm LA Vol (A2C):   28.7 ml 17.64 ml/m RA Volume:   29.20 ml  17.94 ml/m LA Vol (A4C):   34.8 ml 21.39 ml/m LA Biplane Vol: 32.4 ml 19.91 ml/m  AORTIC VALVE AV Area (Vmax):    2.20 cm AV Area (Vmean):   2.19 cm AV Area (VTI):     2.32 cm AV Vmax:           121.50  cm/s AV Vmean:          81.900 cm/s AV VTI:            0.233 m AV Peak Grad:      5.9 mmHg AV Mean Grad:      3.0 mmHg LVOT Vmax:         105.00 cm/s LVOT Vmean:        70.400 cm/s LVOT VTI:          0.212 m LVOT/AV VTI ratio: 0.91  AORTA Ao Root diam: 2.50 cm Ao Asc diam:  2.90 cm MITRAL VALVE                TRICUSPID VALVE MV Area (PHT): 3.17 cm     TR Peak grad:   20.8 mmHg MV Decel Time: 239 msec     TR Vmax:        228.00 cm/s MV E velocity: 85.10 cm/s MV A velocity: 118.00 cm/s  SHUNTS MV E/A ratio:  0.72         Systemic VTI:  0.21 m                             Systemic Diam: 1.80 cm Rachelle Hora Croitoru Judy Gutierrez Electronically signed by Thurmon Fair Judy Gutierrez Signature Date/Time: 02/23/2021/4:12:19 PM    Final    CT HEAD CODE STROKE WO CONTRAST  Result Date: 02/23/2021 CLINICAL DATA:  Code stroke. Initial evaluation for neuro deficit, stroke suspected. EXAM: CT HEAD WITHOUT CONTRAST TECHNIQUE: Contiguous axial images were obtained from the base of the skull through the vertex without intravenous contrast. COMPARISON:  Prior CT from 01/31/2020. FINDINGS: Brain: Streak/beam hardening artifact emanating from the left occipital calvarium noted, mildly limiting assessment. Cerebral volume within normal limits. Focal encephalomalacia at the central and left superior cerebellum, consistent with a chronic left MCA distribution infarct. Subtle evolving hypodensity noted involving the superior right cerebellum and cerebellar vermis, concerning for an acute right SCA distribution infarct (series 3, image 12, 11). No associated hemorrhage or mass effect. No other evidence for acute large vessel territory infarct. No intracranial hemorrhage. No mass lesion, mass effect or midline shift. No hydrocephalus or extra-axial fluid collection. Vascular: No hyperdense vessel. Skull: Scalp soft tissues demonstrate no acute finding. Small focal exostosis noted at the right frontal calvarium. Calvarium intact. Sinuses/Orbits: Globes and orbital  soft tissues within normal limits. Moderate left sphenoid and maxillary sinusitis. Additional scattered pneumatized secretions noted within the ethmoidal air cells. Subcentimeter osteoma noted within the right ethmoidal air cells as well. Mastoid air cells are clear. Other: None. ASPECTS [facility] Stroke Program Early CT Score) - Ganglionic level  infarction (caudate, lentiform nuclei, internal capsule, insula, M1-M3 cortex): 7 - Supraganglionic infarction (M4-M6 cortex): 3 Total score (0-10 with 10 being normal): 10 IMPRESSION: 1. Evolving hypodensity involving the superior right cerebellum/cerebellar vermis, concerning for an acute right SCA distribution infarct. No intracranial hemorrhage or mass effect. 2. ASPECTS is 10. 3. Underlying chronic left SCA distribution infarcts. Critical Value/emergent results were called by telephone at the time of interpretation on 02/23/2021 at 12:55 a.m. to provider Dr. Amada JupiterKirkpatrick, Who verbally acknowledged these results. Electronically Signed   By: Rise MuBenjamin  McClintock M.D.   On: 02/23/2021 01:23   CT ANGIO HEAD NECK W WO CM (CODE STROKE)  Result Date: 02/23/2021 CLINICAL DATA:  Initial evaluation for neuro deficit, stroke suspected. EXAM: CT ANGIOGRAPHY HEAD AND NECK TECHNIQUE: Multidetector CT imaging of the head and neck was performed using the standard protocol during bolus administration of intravenous contrast. Multiplanar CT image reconstructions and MIPs were obtained to evaluate the vascular anatomy. Carotid stenosis measurements (when applicable) are obtained utilizing NASCET criteria, using the distal internal carotid diameter as the denominator. CONTRAST:  75mL OMNIPAQUE IOHEXOL 350 MG/ML SOLN COMPARISON:  Prior head CT from earlier the same day. FINDINGS: CTA NECK FINDINGS Aortic arch: Visualized aortic arch normal in caliber with normal branch pattern. No stenosis about the origin of the great vessels. Right carotid system: Right common carotid artery patent from  its origin to the bifurcation without stenosis. Minimal plaque about the right carotid bulb without stenosis. Right ICA patent distally without stenosis, dissection or occlusion. Left carotid system: Left CCA patent from its origin to the bifurcation without stenosis. Minimal plaque about the left carotid bulb without significant stenosis. Left ICA patent distally without stenosis, dissection or occlusion. Vertebral arteries: Left vertebral artery arises directly from the aortic arch. Right vertebral artery dominant. Vertebral arteries widely patent within the neck. Just prior to the skull base/dural reflection, there is distal right V3 segment (series 9, image 83), suspicious for a possible short-segment dissection. Small amount of probable intraluminal thrombus protrudes into the vascular lumen at this level. No frank raised dissection flap is seen. No more than mild stenosis at this level. The right vertebral artery is relatively normal just distally as it crosses into the cranial vault. Skeleton: No acute osseous finding. No discrete or worrisome osseous lesions. Mild cervical spondylosis noted at C5-6 without significant stenosis. Other neck: No other acute soft tissue abnormality within the neck. No mass or adenopathy. Upper chest: Visualized upper chest demonstrates no acute finding. Review of the MIP images confirms the above findings CTA HEAD FINDINGS Anterior circulation: Petrous, cavernous, and supraclinoid segments patent without stenosis or other abnormality. A1 segments widely patent. Normal anterior communicating artery complex. Anterior cerebral arteries patent to their distal aspects without stenosis. No M1 stenosis or occlusion. Normal MCA bifurcations. Distal MCA branches well perfused and symmetric. Posterior circulation: Both V4 segments patent to the vertebrobasilar junction without stenosis. Both PICA origins patent and normal. Basilar somewhat diminutive but patent to its distal aspect  without stenosis. Left superior cerebral artery patent. There is occlusion of the mid-distal right superior cerebellar artery, likely embolic (series 8, image 105). Left PCA supplied via the basilar as well as a prominent left posterior communicating artery. Predominant fetal type origin of the right PCA. Both PCAs remain widely patent to their distal aspects. Venous sinuses: Grossly patent allowing for timing the contrast bolus. Anatomic variants: Predominant fetal type origin of the right PCA. Review of the MIP images confirms the above findings IMPRESSION:  1. Focal intimal irregularity involving the distal right V3 segment, likely reflecting a short-segment dissection. No frank raised dissection flap or significant luminal narrowing. 2. 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 3. Mild for age atheromatous disease elsewhere about the major arterial vasculature of the head and neck. No other hemodynamically significant or correctable stenosis. These results were communicated to Dr. Amada Jupiter at 1:22 amon 5/10/2022by text page via the [facility] messaging system. Electronically Signed   By: Rise Mu M.D.   On: 02/23/2021 01:38    PHYSICAL EXAM  Temp:  [98.4 F (36.9 C)] 98.4 F (36.9 C) (05/26 0505) Pulse Rate:  [85-90] 85 (05/26 0505) Resp:  [17-18] 18 (05/26 0505) BP: (111-130)/(76) 111/76 (05/26 0505) SpO2:  [97 %-100 %] 97 % (05/26 0505)  General - well nourished, well developed, in no apparent distress.    Ophthalmologic - fundi not visualized due to noncooperation.    Cardiovascular - regular rhythm and rate  Mental Status -  Level of arousal and orientation to time, place, and person were intact. Language including expression, naming, repetition, comprehension, reading, and writing was assessed and found intact Fund of Knowledge was assessed and was intact.  Cranial Nerves II - XII - II - Vision intact OU. III, IV, VI -  Extraocular movements intact, but with subjective binocular diplopia bilateral gaze V - Facial sensation intact bilaterally. VII - Facial movement intact bilaterally. VIII - few beats of gaze evoked nystagmus on bilateral gaze, L>R X - Palate elevates symmetrically. XI - Chin turning & shoulder shrug intact bilaterally. XII - Tongue protrusion intact.  Motor Strength - The patient's strength was normal in all extremities and pronator drift was absent.   Motor Tone & Bulk - Muscle tone was assessed at the neck and appendages and was normal.  Bulk was normal and fasciculations were absent.   Reflexes - The patient's reflexes were normal in all extremities and she had no pathological reflexes.  Sensory - Light touch, temperature/pinprick were assessed and were normal.    Coordination - The patient had normal movements in the left hand and feet with no ataxia or dysmetria.    Slight right finger-to-nose dysmetria .  Tremor was absent.  Gait and Station - deferred   ASSESSMENT/PLAN Judy Gutierrez is a 60 y.o. female with history of recurrent stroke, hypertension admitted on 02/26/2021 for rehab due to right cerebellar infarct. She has been working with therapist pretty well resolved vertigo or N/V. however, developed acute onset vertigo and N/V on 03/01/21. Stroke team was called back for evaluation.   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    MRI confirmed right superior cerebellum moderate infarct and punctate dorsal midbrain infarct.    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    CT head 5/16 right cerebellar infarct with increased edema and hemorrhagic conversion  CT repeat 5/18 new right PICA large infarct,  stable right SCA hemorrhagic infarct  CTA head and neck - right V3 and V4 luminal irregularity, suggestive dissection, new V3 outpouching suggestive small pseudoaneurysm  MRI brain - Large acute/subacute right PICA territory infarct and late subacute right SCA territory infarct with superimposed hemorrhagic transformation. Edema from of right cerebellar hemisphere infarcts results in leftward midline  shift with mass effect on the fourth ventricle, mid brain and pons without evidence of hydrocephalus. CT head 03/10/2021: Large right PICA territory infarct with increased acute hemorrhagic transformation associated with a right superior cerebellar infarct and new intraventricular extension of hemorrhage into the fourth ventricle. Increased edema in the superior right cerebellum surrounding the hemorrhage.Progressive ventriculomegaly, compatible with developing obstructive hydrocephalus.  CT head stable right PICA and SCA infarcts, no hydrocephalus.  Loop recorder interrogation negative.    LDL 51  A1c 6.0.   echo 02/23/21 EF 60 to 65%.  No cardiac source of embolism.Marland Kitchen    Hypercoagulable work-up repeated again negative.    Was on aspirin 81 and the Brilinta 90 twice daily for 4 weeks and then Brilinta alone. Given hemorrhagic conversion, brilinta discontinued, continued on ASA 81. With new infarct and possible dissection, added plavix 5/19 after CT stable but stopped after follow-up CT showed worsening of hemorrhage..   Ongoing aggressive stroke risk factor management  Therapy recommendations:  CIR  Disposition:  Pending   Hx of stroke  left cerebellar stroke in 01/2020 status post tPA admitted to [facility].  At that time, CTA head and neck no LVO or stenosis, MRI showed left superior cerebellar infarct.  EF normal range.  TCD bubble study negative.  LDL 138, A1c 6.0.  Hypercoagulable work-up negative.  Bilateral lower extremity DVT negative.  Discharged with DAPT for 3 weeks and then aspirin  alone, as well as Lipitor 40.  After discharge she follow-up with Dr. [name] at [facility], had loop recorder placed in 02/2020.  Hypertension  Stable  Resumed amlodipine   BP goal < 160 given hemorrhagic conversion  Long-term BP goal normotensive  Hyperlipidemia  Home meds:  Lipitor   LDL 51, at goal < 70  Continue lipitor 40  Continue statin at discharge  Other Stroke Risk Factors    Other Active Problems    Hospital day # 13 Patient appears to be neurologically stable even though follow-up CT scan from y`day showed worsening hydrocephalus and hemorrhagic transformation of cytotoxic edema.  Patient will need diagnostic cerebral catheter angiogram at some point but given hemorrhagic transformation she is not a candidate for aggressive antiplatelet therapy at the present time hence will wait till she is finished with inpatient rehab.  If neurological can condition declines may need transfer back to internal medicine in acute care side with management of cytotoxic edema and neurosurgical consultation for consideration for emergent ventriculostomy or posterior fossa decompression.   cerebral catheter angiogram and revascularization will have to wait till her condition stabilizes..    Long discussion with patient a and answered questions.  Plan to discontinue aspirin also due to increased hemorrhagic transformation.   Judy Heady, Judy Gutierrez Stroke Neurology 03/11/2021 1:26 PM    To contact Stroke Continuity provider, please refer to [email or web address]. After hours, contact General Neurology

## 2021-03-11 NOTE — Progress Notes (Signed)
Patient ID: Judy Gutierrez, female   DOB: 12/07/1960, 61 y.o.   MRN: [id number]  This SW covering for primary SW, Judy Gutierrez.  SW received updates from Judy Gutierrez/[employer] reporting that the husband cancelled the w/c order, and will buy her a brand new one.   Judy Gutierrez, MSW, LCSWA Office: [phone] Cell: [phone] Fax: [phone]

## 2021-03-11 NOTE — Progress Notes (Signed)
Speech Language Pathology Weekly Progress and Session Note  Patient Details  Name: Judy Gutierrez MRN: [id number] Date of Birth: 26-Feb-1961  Beginning of progress report period: Mar 03, 2021 End of progress report period: Mar 10, 2021  Today's Date: 03/11/2021 SLP Individual Time:  -    N/a  Short Term Goals: Week 1: SLP Short Term Goal 1 (Week 1): Pt will understand, recall and utilize compensatory strategies for dysarthria with 90% accuracy Supervision A SLP Short Term Goal 1 - Progress (Week 1): Met SLP Short Term Goal 2 (Week 1): Pt will complete medication management task with 100% accuracy Supervision A SLP Short Term Goal 2 - Progress (Week 1): Met SLP Short Term Goal 3 (Week 1): Pt will complete complex problem solving task for home environment with 90% accuracy Supervision A SLP Short Term Goal 3 - Progress (Week 1): Met    New Short Term Goals: Week 2: SLP Short Term Goal 1 (Week 2): STG=LTG due to ELOS.  Weekly Progress Updates: Pt has met 3/3 short term goals this reporting period. She demonstrates improvement in problem solving with functional complex tasks, speech intelligibility, understanding of strategies, and understanding of pill organizer for med management. She continues to require supervision cues for error awareness and attention to details during tasks to eliminate errors. She will benefit from continued SLP tx until dc from hospital on 5/31 to maximize functional independence and overall cognition.      Intensity: Minumum of 1-2 x/day, 30 to 90 minutes Frequency: 1 to 3 out of 7 days Duration/Length of Stay: 7-10 days Treatment/Interventions: Therapeutic Exercise;Therapeutic Activities;Medication managment;Patient/family education;Speech/Language facilitation;Cueing hierarchy   Daily Session  Skilled Therapeutic Interventions: Progress note completed. Pt being discharged home 5/31. No slp tx today.       Pain  n/a  Therapy/Group: no slp tx today.    Gregary Signs A Dymin Dingledine 03/11/2021, 3:23 PM

## 2021-03-11 NOTE — Progress Notes (Signed)
Occupational Therapy Session Note  Patient Details  Name: Judy Gutierrez MRN: [id number] Date of Birth: April 13, 1961  Today's Date: 03/11/2021 OT Individual Time: 1310-1347 OT Individual Time Calculation (min): 37 min    Short Term Goals: Week 1:  OT Short Term Goal 1 (Week 1): Pt will complete toileting tasks at CGA level OT Short Term Goal 1 - Progress (Week 1): Met OT Short Term Goal 2 (Week 1): Pt will don pants with CGA OT Short Term Goal 2 - Progress (Week 1): Met OT Short Term Goal 3 (Week 1): Pt will utilize appropriate visual compensation strategies during ADL routine with min cueing OT Short Term Goal 3 - Progress (Week 1): Met OT Short Term Goal 4 (Week 1): Pt will transfer to walk in shower with min A OT Short Term Goal 4 - Progress (Week 1): Met Week 2:  OT Short Term Goal 2 (Week 2): STGs= LTGs  Skilled Therapeutic Interventions/Progress Updates:    Pt received in bed and consented to OT tx. Session focused on standing balance and tolerance while reaching outside of BOS to complete BITs board activities, BUE strengthening HEP, and [place] and grasping activities for improved hand function during ADLs. Pt demo's 1 mild LOB when reaching during standing with RW for BITs board activity. Therapist min A to correct.Instructed in 2# dowel rod exercises including elbow flexion, chest press, shoulder pres, and shoulder flexion all for 3x15 with min cuing for proper tech with good carryover. Pt instructed ion clothespin activity for increased R hand strength and coordination. After tx, pt helped back to bed and left with all needs met.   Therapy Documentation Precautions:  Precautions Precautions: Fall Precaution Comments: Diplopia, trunk/RUE/LE ataxic. Systolic BP <771 Restrictions Weight Bearing Restrictions: No General:     Pain: none     Therapy/Group: Individual Therapy  Odaly Peri 03/11/2021, 1:44 PM

## 2021-03-12 ENCOUNTER — Inpatient Hospital Stay (HOSPITAL_COMMUNITY): Payer: No Typology Code available for payment source

## 2021-03-12 MED ORDER — FUROSEMIDE 10 MG/ML IJ SOLN
INTRAMUSCULAR | Status: AC
  Filled 2021-03-12: qty 8

## 2021-03-12 NOTE — Progress Notes (Signed)
PROGRESS NOTE   Subjective/Complaints: Patient seen laying in bed this AM.  She states she slept well overnight.  She has questions regarding discharge date and CT scan.   ROS: Denies CP, SOB, N/V/D  Objective:   CT HEAD WO CONTRAST  Result Date: 03/12/2021 CLINICAL DATA:  Stroke follow-up EXAM: CT HEAD WITHOUT CONTRAST TECHNIQUE: Contiguous axial images were obtained from the base of the skull through the vertex without intravenous contrast. 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 No hemorrhage in this infarct Relatively large area of acute/subacute infarct in the right superior cerebellar artery territory with hemorrhagic transformation, unchanged. This infarct shows diffuse edema and considerable mass-effect on the fourth ventricle which is flattened and displaced to the left. There is mild obstructive hydrocephalus, slightly improved from the prior study. No new area of infarct or hemorrhage since yesterday. Vascular: Negative for hyperdense vessel Skull: Negative Sinuses/Orbits: Moderate mucosal edema left maxillary sinus unchanged. Remaining sinuses clear. 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 Electronically Signed   By: Marlan Palau M.D.   On: 03/12/2021 07:44   Recent Labs    03/11/21 0500  WBC 5.3  HGB 11.9*  HCT 35.7*  PLT 293   Recent Labs    03/11/21 0500  NA 137  K 3.8  CL 102  CO2 27  GLUCOSE 117*  BUN 12  CREATININE 0.65  CALCIUM 9.0    Intake/Output Summary (Last 24 hours) at 03/12/2021 0824 Last data filed at 03/11/2021 1300 Gross  per 24 hour  Intake 240 ml  Output --  Net 240 ml        Physical Exam: Vital Signs Blood pressure 122/81, pulse 81, temperature 97.9 F (36.6 C), temperature source Oral, resp. rate 19, height 5\' 2"  (1.575 m), weight 58.1 kg, last menstrual period 12/08/2010, SpO2 100 %.  Constitutional: No distress . Vital signs reviewed. HENT: Normocephalic.  Atraumatic. Eyes: EOMI. No discharge. Cardiovascular: No JVD.  RRR. Respiratory: Normal effort.  No stridor.  Bilateral clear to auscultation. GI: Non-distended.  BS +. Skin: Warm and dry.  Intact. Psych: Normal mood.  Normal behavior. Musc: No edema in extremities.  No tenderness in extremities. Neuro: Alert Motor: 5/5 throughout RUE dysmetria and ataxia, stable LUE dysmetria improved Dysarthria, stable  Assessment/Plan: 1. Functional deficits which require 3+ hours per day of interdisciplinary therapy in a comprehensive inpatient rehab setting.  Physiatrist is providing close team supervision and 24 hour management of active medical problems listed below.  Physiatrist and rehab team continue to assess barriers to discharge/monitor patient progress toward functional and medical goals  Care Tool:  Bathing    Body parts bathed by patient: Right arm,Right lower leg,Left arm,Chest,Abdomen,Front perineal area,Buttocks,Left upper leg,Left lower leg,Face,Right upper leg         Bathing assist Assist Level: Supervision/Verbal cueing     Upper Body Dressing/Undressing Upper body dressing   What is the patient  wearing?: Hospital gown only    Upper body assist Assist Level: Supervision/Verbal cueing    Lower Body Dressing/Undressing Lower body dressing      What is the patient wearing?: Pants     Lower body assist Assist for lower body dressing: Minimal Assistance - Patient > 75%     Toileting Toileting    Toileting assist Assist for toileting: Contact Guard/Touching assist     Transfers Chair/bed  transfer  Transfers assist     Chair/bed transfer assist level: Minimal Assistance - Patient > 75%     Locomotion Ambulation   Ambulation assist      Assist level: Minimal Assistance - Patient > 75% Assistive device: Walker-rolling Max distance: 138ft   Walk 10 feet activity   Assist     Assist level: Minimal Assistance - Patient > 75% Assistive device: Walker-rolling   Walk 50 feet activity   Assist    Assist level: Minimal Assistance - Patient > 75% Assistive device: Walker-rolling    Walk 150 feet activity   Assist    Assist level: Minimal Assistance - Patient > 75% Assistive device: Walker-rolling    Walk 10 feet on uneven surface  activity   Assist Walk 10 feet on uneven surfaces activity did not occur: Safety/medical concerns   Assist level: Minimal Assistance - Patient > 75% Assistive device: Photographer Will patient use wheelchair at discharge?: Yes Type of Wheelchair: Manual    Wheelchair assist level: Minimal Assistance - Patient > 75% Max wheelchair distance: 200    Wheelchair 50 feet with 2 turns activity    Assist        Assist Level: Minimal Assistance - Patient > 75%   Wheelchair 150 feet activity     Assist      Assist Level: Minimal Assistance - Patient > 75%    Medical Problem List and Plan: 1.  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 Increased edema associated with the right cerebellar and right dorsal midbrain infarcts with interval development of acute hemorrhage at the site of right cerebellar infarct. Increased resulting mass effect on the fourth ventricle without hydrocephalus.      2. Paranasal sinus mucosal thickening with frothy  secretions.  Repeat CT on 5/17 showing new right PICA infarct, repeat on 5/19 showing stability, repeat CT on 5/21 shows worsening/progression of cytotoxic edema from right PICA infarct, hemorrhagic transformation of right SCA infarct, and hydrocephalus, repeat CT later on 5/20 stable.   Repeat head CT on 5/25 showing large right PICA territory infarct with increased hemorrhagic transformation and new intraventricular extension of hemorrhage into fourth ventricle with increasing edema.  Also progressive ventriculomegaly with developing obstructive hydrocephalus.  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  Leftward midline shift with mass-effect on fourth ventricle, but no hydrocephalus  Appreciate Neuro recs, discussed with Neuro - following closely  May require emergent ventriculostomy versus decompression if decompensated.   Cerebral catheter angio and revascularization when stabilizes   Continue CIR, if neurological changes noticed, patient  will need emergent transfer to acute floor 2.  Antithrombotics: -DVT/anticoagulation: Lovenox, d/ced also.              -antiplatelet therapy: Aspirin 81 mg daily d/ced as well due to increasing hemorrhage, Brilinta d/ced, Plavix initiated, but also DC'd on 5/21 due to progression of hemorrhage 3. Pain Management: Continue Tylenol as needed             Monitor, particularly for headaches with increased exertion  Controlled on 5/27 4. Mood: Provide emotional support             -antipsychotic agents: N/A 5. Neuropsych: This patient is capable of making decisions on her own behalf. 6. Skin/Wound Care: Routine skin checks 7. Fluids/Electrolytes/Nutrition: Routine in and outs 8.  Hypertension.  Patient on Cozaar50 mg daily, Norvasc 5 mg daily  prior to admission.    Norvasc 5 started on 5/16, continue             Vitals with BMI 03/12/2021 03/11/2021 03/11/2021  Height - - -  Weight - - -  BMI - - -  Systolic 122 109 876  Diastolic 81 69 75  Pulse 81 85 89   Controlled on 5/27 9. Hyperlipidemia: Lipitor 10. ABLA: Resolved             Hb 11.9 on 5/26, labs ordered for Monday  Cont to monitor 12. Transaminitis: Resolved  Cont to monitor 13. Dysarthria  Continue SLP 14.  Hyponatremia: Resolved 15.  Tachycardia  ECG reviewed, no significant change in ECG from prior  Improved  LOS: 14 days A FACE TO FACE EVALUATION WAS PERFORMED  Judy Gutierrez 03/12/2021, 8:24 AM

## 2021-03-12 NOTE — Progress Notes (Signed)
Physical Therapy Session Note  Patient Details  Name: Judy Gutierrez MRN: [id number] Date of Birth: 02-17-1961  Today's Date: 03/12/2021 PT Individual Time: 0903-1000 PT Individual Time Calculation (min): 57 min   Short Term Goals: Week 2:  PT Short Term Goal 1 (Week 2): STG = LTG due to ELOS  Skilled Therapeutic Interventions/Progress Updates: Pt presents supine in bed and agreeable to therapy.  Pt transfers sup to sit w/ supervision and then sat EOB to don socks w/ Figure-4 position, max A for shoes.  BP monitored at 114/73 in sitting.  Pt performed sit to stand transfer w/ min A and SPT to w/c w/ min A.  Pt wheeled self to gym using BLEs and LUE.  Pt  amb multiple trials w/ RW and min A, verbal cues for visual scanning, and RLE placement, especially for improved BOS.  Pt performed obstacle course through cones.  Pt performed standing balance and then adding BUE shoulder flexion and then on Airex cushion.  Pt required seated rest breaks for fatigue.  Pt returned to room and amb to bed.  Pt transferred sit to supine w/ supervision.  Bed alarm on and all needs in reach.     Therapy Documentation Precautions:  Precautions Precautions: Fall Precaution Comments: Diplopia, trunk/RUE/LE ataxic. Systolic BP <160 Restrictions Weight Bearing Restrictions: No General:   Vital Signs:   Pain: 0/10 Pain Assessment Pain Scale: 0-10 Pain Score: 0-No pain    Therapy/Group: Individual Therapy  Lucio Edward 03/12/2021, 12:21 PM

## 2021-03-12 NOTE — Progress Notes (Signed)
Occupational Therapy Weekly Progress Note  Patient Details  Name: Judy Gutierrez MRN: [id number] Date of Birth: [birth year]  Beginning of progress report period: Mar 05, 2021 End of progress report period: Mar 12, 2021  Short term goals not set due to estimated length of stay. Week 3 STG's=LTGs. Pt is demonstrating good progress overall towards CGA LTGs, but still needing min A intermittently for transfers and maintaining standing balance during ambulation and functional activities for safety. Therapy is continuing to address diplopia with L lens occluded on glasses with mobility and various visual exercises to focus on diplopia.  Have begun family education in preparation for upcoming d/c.  Patient continues to demonstrate the following deficits: muscle weakness, decreased cardiorespiratoy endurance, impaired timing and sequencing, unbalanced muscle activation and ataxia, decreased visual motor skills and diplopia and decreased sitting balance, decreased standing balance, decreased postural control and decreased balance strategies and therefore will continue to benefit from skilled OT intervention to enhance overall performance with BADL.  See Patient's Care Plan for progression toward long term goals.  Patient progressing toward long term goals..  Continue plan of care.   Therapy Documentation Precautions:  Precautions Precautions: Fall Precaution Comments: Diplopia, trunk/RUE/LE ataxic. Systolic BP <160 Restrictions Weight Bearing Restrictions: No General:   Vital Signs: Therapy Vitals Temp: 98 F (36.7 C) Temp Source: Oral Pulse Rate: (!) 110 Resp: 18 BP: 119/73 Patient Position (if appropriate): Sitting Oxygen Therapy SpO2: 100 % O2 Device: Room Air Pain: Pain Assessment Pain Scale: 0-10 Pain Score: 0-No pain ADL: ADL Eating: Supervision/safety Where Assessed-Eating: Edge of bed Grooming: Setup Where Assessed-Grooming: Standing at sink Upper Body Bathing:  Supervision/safety Where Assessed-Upper Body Bathing: Shower Lower Body Bathing: Contact guard Where Assessed-Lower Body Bathing: Shower Upper Body Dressing: Setup Where Assessed-Upper Body Dressing: Edge of bed Lower Body Dressing: Contact guard Where Assessed-Lower Body Dressing: Edge of bed Toileting: Contact guard Where Assessed-Toileting: Teacher, adult education: Furniture conservator/restorer Method: Proofreader: Engineer, manufacturing systems Transfer: Administrator, arts Method: Designer, industrial/product: Emergency planning/management officer    Rosalio Loud 03/12/2021, 3:28 PM

## 2021-03-12 NOTE — Progress Notes (Signed)
Speech Language Pathology Daily Session Note  Patient Details  Name: Judy Gutierrez MRN: [id number] Date of Birth: 06/19/1961  Today's Date: 03/12/2021 SLP Individual Time: 1400-1430 SLP Individual Time Calculation (min): 30 min  Short Term Goals: Week 2: SLP Short Term Goal 1 (Week 2): STG=LTG due to ELOS.  Skilled Therapeutic Interventions:   Patient seen for skilled ST session focusing on cognitive function goals. Patient was napping in bed but agreeable to therapy. She requested bathroom assistance. She required supervision to CG assist while using RW to get to toilet and SLP did note and inform patient of fall risk from slippers she was wearing as they were frequently sliding off feet while walking. Patient did not exhibit any unsafe behaviors and she requested assistance when needed appropriately. SLP then discussed upcoming discharge with patient. She reported that her sister and brother in law will be staying with them and helping her and she and husband have been discussing daily routine of him helping her with shower before he goes to work. Patient continues with dysarthria, requiring cues to slow down speech rate during conversation. She also exhibited some confusion which persisted, thinking she had another therapy after this ST session, despite SLP reviewing with her that she did not when talking earlier in session. Patient continues to benefit from skilled SLP intervention to maximize cognitive-linguistic and speech goals prior to discharge.  Pain Pain Assessment Pain Scale: 0-10 Pain Score: 0-No pain  Therapy/Group: Individual Therapy  Angela Nevin, MA, CCC-SLP Speech Therapy

## 2021-03-12 NOTE — Progress Notes (Signed)
Occupational Therapy Session Note  Patient Details  Name: Judy Gutierrez MRN: [id number] Date of Birth: 1961/01/07  Today's Date: 03/12/2021 OT Individual Time: 1048-1200 OT Individual Time Calculation (min): 72 min    Short Term Goals: Week 2:  OT Short Term Goal 2 (Week 2): STGs= LTGs  Skilled Therapeutic Interventions/Progress Updates:    Pt received supine, agreeable to OT session and no pain reported. Pt agreeable to shower. Sit<>supine using bed rails close spvsn. Sit<>stand CGA with min vc's for hand placement and safe use of RW; pt demo'd good carryover during session. Pt ambulated to bathroom from bed with RW at [facility]. Showered with close spvsn seated and in stance for washing buttocks. UB dressing seated min A to hook bra straps; discussed adapted method of hooking in front. LB dressing CGA seated and in stance. Pt engaged in dynamic standing balance tasks including lateral and anterior/posterior weight shifts using RW (completed BUEs supported and unsupported) with slight LOB posteriorly but self corrected without vc's. Light house cleaning dynamic standing balance completed with focus on using RUE in functional tasks as pt reports "using R hand is difficult." Pt engaged in NMR visual motor tasks targeting diplopia including Fransisco Beau String exercises focusing on convergence and tracking. Pt encouraged to complete visual motor homework in between sessions. Pt left supine in bed, alarm set, call bell in reach, and all needs met.   Therapy Documentation Precautions:  Precautions Precautions: Fall Precaution Comments: Diplopia, trunk/RUE/LE ataxic. Systolic BP <416 Restrictions Weight Bearing Restrictions: No Pain: Pain Assessment Pain Scale: 0-10 Pain Score: 0-No pain   Therapy/Group: Individual Therapy  Mellissa Kohut 03/12/2021, 3:13 PM

## 2021-03-12 NOTE — Progress Notes (Addendum)
STROKE TEAM PROGRESS NOTE   SUBJECTIVE (INTERVAL HISTORY) Patient is working with physical therapist in the gym.  She shown improvement and likely looking for discharge early next week..  She denies any headache, nausea or new complaints.  Patient`s  neurological exam is unchanged, still has mild diplopia and right arm incoordination but appears to be improving..       Vital signs are stable.  Repeat CT this morning shows improvement in cytotoxic edema and hydrocephalus and no increase in hemorrhagic transformation.   OBJECTIVE Temp:  [97.9 F (36.6 C)-99 F (37.2 C)] 97.9 F (36.6 C) (05/27 0505) Pulse Rate:  [81-89] 81 (05/27 0505) Resp:  [16-19] 19 (05/27 0505) BP: (109-122)/(69-81) 122/81 (05/27 0505) SpO2:  [99 %-100 %] 100 % (05/27 0505)  No results for input(s): GLUCAP in the last 168 hours. Recent Labs  Lab 03/08/21 0945 03/11/21 0500  NA 135 137  K 3.9 3.8  CL 102 102  CO2 25 27  GLUCOSE 134* 117*  BUN 15 12  CREATININE 0.58 0.65  CALCIUM 9.0 9.0   Recent Labs  Lab 03/11/21 0500  AST 36  ALT 30  ALKPHOS 61  BILITOT 1.1  PROT 7.1  ALBUMIN 3.2*   Recent Labs  Lab 03/08/21 0945 03/11/21 0500  WBC 6.0 5.3  NEUTROABS 3.6 2.4  HGB 12.4 11.9*  HCT 36.8 35.7*  MCV 86.8 86.4  PLT 299 293   No results for input(s): CKTOTAL, CKMB, CKMBINDEX, TROPONINI in the last 168 hours. No results for input(s): LABPROT, INR in the last 72 hours. No results for input(s): COLORURINE, LABSPEC, PHURINE, GLUCOSEU, HGBUR, BILIRUBINUR, KETONESUR, PROTEINUR, UROBILINOGEN, NITRITE, LEUKOCYTESUR in the last 72 hours.  Invalid input(s): APPERANCEUR     Component Value Date/Time   CHOL 105 02/23/2021 0745   CHOL 191 09/09/2015 0804   TRIG 28 02/23/2021 0745   HDL 48 02/23/2021 0745   HDL 60 09/09/2015 0804   CHOLHDL 2.2 02/23/2021 0745   VLDL 6 02/23/2021 0745   LDLCALC 51 02/23/2021 0745   LDLCALC 120 (H) 09/09/2015 0804   Lab Results  Component Value Date   HGBA1C 6.0 (H)  02/23/2021      Component Value Date/Time   LABOPIA NONE DETECTED 02/23/2021 0033   COCAINSCRNUR NONE DETECTED 02/23/2021 0033   LABBENZ NONE DETECTED 02/23/2021 0033   AMPHETMU NONE DETECTED 02/23/2021 0033   THCU NONE DETECTED 02/23/2021 0033   LABBARB NONE DETECTED 02/23/2021 0033    No results for input(s): ETH in the last 168 hours.  I have personally reviewed the radiological images below and agree with the radiology interpretations.  CT ANGIO HEAD NECK W WO CM  Result Date: 03/03/2021 CLINICAL DATA:  Stroke follow-up. EXAM: CT ANGIOGRAPHY HEAD AND NECK TECHNIQUE: Multidetector CT imaging of the head and neck was performed using the standard protocol during bolus administration of intravenous contrast. Multiplanar CT image reconstructions and MIPs were obtained to evaluate the vascular anatomy. Carotid stenosis measurements (when applicable) are obtained utilizing NASCET criteria, using the distal internal carotid diameter as the denominator. CONTRAST:  50mL OMNIPAQUE IOHEXOL 350 MG/ML SOLN COMPARISON:  CT angiogram of the head and neck Feb 23, 2021. FINDINGS: CTA NECK FINDINGS Aortic arch: 4 vessel aortic arch with direct origin of the left vertebral artery from the aortic arch in. Imaged portion shows no evidence of aneurysm or dissection. No significant stenosis of the major arch vessel origins. Right carotid system: Minimal atherosclerotic changes of the right carotid bifurcation without hemodynamically significant stenosis.  Left carotid system: Mild atherosclerotic changes of the left carotid bifurcation without hemodynamically significant stenosis. Vertebral arteries: Right dominant. The non dominant left vertebral artery has normal course and caliber from the aortic arch to the vertebrobasilar junction. Area of luminal irregularity is again seen in the V3 segment of the right vertebral artery now with mild stenosis followed by small (1-2 mm) outpouching suggesting pseudoaneurysm. There  is also luminal irregularity at the V4 segment. Skeleton: Negative. Other neck: Negative. Upper chest: Negative. Review of the MIP images confirms the above findings CTA HEAD FINDINGS Anterior circulation: No significant stenosis, proximal occlusion, aneurysm, or vascular malformation. Posterior circulation: Mild luminal irregularity of the V4 segment of the right vertebral artery which involves the origin of the right PICA which appear patent. The basilar artery is small in caliber without area of focal stenosis, likely related to the presence of bilateral prominent posterior communicating arteries. The right P1/PCA segment is absent (fetal PCA). Interval recanalization of the right superior cerebellar artery. Venous sinuses: As permitted by contrast timing, patent. Anatomic variants: Right fetal PCA. Prominent left posterior communicating artery. Review of the MIP images confirms the above findings IMPRESSION: 1. Luminal irregularity in the V3 and V4 segments of the right vertebral artery is suggestive of dissection without hemodynamically significant stenosis. Area of irregularity of the origin of the right PICA which remains patent. There is a new diminutive outpouching from the right V3 segment suggestive of a small pseudoaneurysm. 2. Interval recanalization of the right superior cerebellar artery. 3. No significant findings in the anterior circulation. Electronically Signed   By: Baldemar LenisKatyucia  De Macedo Rodrigues M.D.   On: 03/03/2021 12:54   CT HEAD WO CONTRAST  Result Date: 03/12/2021 CLINICAL DATA:  Stroke follow-up EXAM: CT HEAD WITHOUT CONTRAST TECHNIQUE: Contiguous axial images were obtained from the base of the skull through the vertex without intravenous contrast. 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 No hemorrhage in this infarct Relatively large area of acute/subacute infarct in the right superior cerebellar artery  territory with hemorrhagic transformation, unchanged. This infarct shows diffuse edema and considerable mass-effect on the fourth ventricle which is flattened and displaced to the left. There is mild obstructive hydrocephalus, slightly improved from the prior study. No new area of infarct or hemorrhage since yesterday. Vascular: Negative for hyperdense vessel Skull: Negative Sinuses/Orbits: Moderate mucosal edema left maxillary sinus unchanged. Remaining sinuses clear. 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 Electronically Signed   By: Marlan Palauharles  Clark M.D.   On: 03/12/2021 07:44   CT Head Wo Contrast  Result Date: 03/10/2021 CLINICAL DATA:  Mar 06, 2021. EXAM: CT HEAD WITHOUT CONTRAST TECHNIQUE: Contiguous axial images were obtained from the base of the skull through the vertex without intravenous contrast. COMPARISON:  CT head Mar 06, 2021. FINDINGS: Brain: Large area of hypodensity involving the inferior right cerebellum, compatible with PICA territory infarct. Increased acute hemorrhage associated with the right superior cerebellar infarct, compatible with worsening hemorrhagic transformation. There is new intraventricular extension of hemorrhage into the fourth ventricle (series 3, image 9 and series 6, image 31). There is persistent mass effect on the fourth ventricle which is effaced and displaced to the left. Increased edema in the superior right cerebellum surrounding the  hemorrhage. There is increasing ventriculomegaly with increased rounding of the temporal horns and outwardly convex third ventricle, compatible with worsening hydrocephalus. No supratentorial midline shift. No definite interval/new large  vascular territory infarct. Vascular: No hyperdense vessel identified. Calcific atherosclerosis. Skull: No acute fracture. Sinuses/Orbits: Paranasal sinus mucosal thickening with air-fluid levels in the left maxillary and left sphenoid sinuses. Right ethmoid osteoma. Other: Clear mastoid air cells. IMPRESSION: 1. 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 Increased edema in the superior right cerebellum surrounding the hemorrhage. 2. Progressive ventriculomegaly, compatible with developing obstructive hydrocephalus. These results will be called to the ordering clinician or representative by the Radiologist Assistant, and communication documented in the PACS or [employer]. Electronically Signed   By: Feliberto Harts MD   On: 03/10/2021 07:33   CT HEAD WO CONTRAST  Result Date: 03/06/2021 CLINICAL DATA:  Stroke follow-up EXAM: CT HEAD WITHOUT CONTRAST TECHNIQUE: Contiguous axial images were obtained from the base of the skull through the vertex without intravenous contrast. COMPARISON:  CT head 03/16/2021 FINDINGS: Brain: Large hypodensity right inferior cerebellum compatible with PICA infarct unchanged. 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 There is mass-effect on the fourth ventricle which is effaced and mildly displaced to the left. Mild ventricular enlargement is present unchanged from earlier today but progressed since 02/23/2021 Chronic infarct left superior cerebellum unchanged. Vascular: Negative for hyperdense vessel Skull: Negative Sinuses/Orbits: Mucosal edema paranasal sinuses. Air-fluid level left sphenoid sinus. Osteoma right ethmoid sinus. Mastoid clear bilaterally. Other: None IMPRESSION: Stable CT head. Large right PICA infarct with edema. Hemorrhagic transformation of  right superior cerebellar infarct Mass-effect on the fourth ventricle and mild hydrocephalus unchanged from earlier today. Electronically Signed   By: Marlan Palau M.D.   On: 03/06/2021 15:56   CT HEAD WO CONTRAST  Addendum Date: 03/06/2021   ADDENDUM REPORT: 03/06/2021 07:54 ADDENDUM: Study discussed by telephone with Dr. Iver Nestle on 03/06/2021 at 0611 hours. Electronically Signed   By: Odessa Fleming M.D.   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 Subsequent encounter. EXAM: CT HEAD WITHOUT CONTRAST TECHNIQUE: Contiguous axial images were obtained from the base of the skull through the vertex without intravenous contrast. COMPARISON:  Head CT 03/04/2021 and earlier. FINDINGS: Brain: Lobular hyperdense hemorrhage has progressed in the right SCA territory since 03/04/2021, with increasing regional edema and mass effect compatible with progressive hemorrhagic transformation (coronal image 46). The volume of hyperdense blood there is approximately 3 mL. Confluent right PICA infarct with cytotoxic edema which has mildly progressed since 03/03/2021. Combined, there is mild progressive basilar cistern effacement, although the cisterna magna remains patent. Fourth ventricle remains effaced. Since 03/03/2021 lateral and 3rd ventricle size is slightly increased (series 3, image 14 today versus series 3, image 17 on 03/03/2021). No supratentorial transependymal edema. Supratentorial gray-white matter differentiation preserved with no midline shift. No supratentorial hemorrhage. Vascular: Mild Calcified atherosclerosis at the skull base. Skull: Stable. Small benign right frontal bone exostosis on series 4, image 41. Sinuses/Orbits: Stable paranasal sinuses. Tympanic cavities and mastoids remain clear. Other: Stable leftward gaze deviation. Visualized scalp soft tissues are within normal limits. IMPRESSION: 1. 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 2. At the same time cytotoxic edema from  the large right PICA infarct has mildly progressed. 3. Subsequent increased posterior fossa mass although the pre medullary cistern and cisterna magna remain patent. 4. There has been subtle enlargement of the lateral and 3rd ventricles since 03/03/2021, but there is no transependymal edema. Electronically Signed: By: Odessa Fleming M.D. On: 03/06/2021 06:05   CT HEAD WO CONTRAST  Result Date: 03/04/2021 CLINICAL DATA:  60 year old female with new right PICA infarct superimposed on recent right SCA infarct. EXAM: CT HEAD WITHOUT CONTRAST TECHNIQUE: Contiguous axial images were obtained from the base of the skull through the vertex without intravenous contrast. COMPARISON:  Brain MRI, CTA head and neck, CT head yesterday. FINDINGS: Brain: Supratentorial gray-white matter differentiation remains stable. And the lateral and 3rd ventricle size and configuration is stable with temporal horns remaining diminutive on series 3, image 9. Subacute right SCA infarct with petechial hemorrhage and more acute confluent right PICA infarct with cytotoxic edema redemonstrated. Stable posterior fossa mass effect including on the 4th ventricle. Superimposed chronic left SCA cerebellar infarct. Posterior fossa gray-white matter differentiation and basilar cisterns are stable since 0645 hours yesterday. Vascular: Mild Calcified atherosclerosis at the skull base. Skull: Stable.  No acute osseous abnormality identified. Sinuses/Orbits: Stable occasional sinus mucosal thickening and bubbly opacity. Tympanic cavities and mastoids remain clear. Other: Leftward gaze deviation as before. Visualized scalp soft tissues are within normal limits. IMPRESSION: 1. Stable since yesterday. Confluent Right PICA infarct superimposed on subacute Right SCA infarct with petechial hemorrhage.  Stable posterior fossa mass effect. No lateral or 3rd ventriculomegaly at this time. 2. No new intracranial abnormality. Electronically Signed   By: Odessa Fleming M.D.   On: 03/04/2021 06:08   CT HEAD WO CONTRAST  Addendum Date: 03/03/2021   ADDENDUM REPORT: 03/03/2021 08:53 ADDENDUM: Study discussed by telephone with Dr. [name] on 03/03/2021 at 0847 hours. Electronically Signed   By: Odessa Fleming M.D.   On: 03/03/2021 08:53   Result Date: 03/03/2021 CLINICAL DATA:  60 year old female with right SCA cerebellar infarct on MRI 02/23/2021. Subsequent encounter. EXAM: CT HEAD WITHOUT CONTRAST TECHNIQUE: Contiguous axial images were obtained from the base of the skull through the vertex without intravenous contrast. COMPARISON:  Head CT 03/01/2021 and earlier. FINDINGS: Brain: Right superior cerebellar artery territory and cerebellar vermis cytotoxic edema with petechial hemorrhage. But new confluent cytotoxic edema now in the right PICA territory (series 3, image 5). Subsequent increased posterior fossa mass effect, with decreased prepontine and pre medullary cisterns. No tonsillar herniation. Increased effacement of the 4th ventricle, but lateral and 3rd ventricle size not significantly changed at this time. Supratentorial gray and white matter signal is stable. No new intracranial hemorrhage. Vascular: Mild Calcified atherosclerosis at the skull base. No suspicious intracranial vascular hyperdensity. Skull: No acute osseous abnormality identified. Small benign right frontal bone exostosis on series 4, image 59. Sinuses/Orbits: Stable mucosal thickening and bubbly opacity in the left sphenoid, left maxillary, and posterior right ethmoid sinuses. Tympanic cavities and mastoids remain clear. Other: Leftward gaze deviation. Visualized scalp soft tissues are within normal limits. IMPRESSION: 1. Confluent acute right PICA infarct is new since 03/01/2021, superimposed on the recent right SCA infarct. Subsequent increased  cytotoxic edema in the right cerebellum with increased posterior fossa mass effect including effaced 4th ventricle. But no malignant hemorrhagic transformation or ventriculomegaly at this time. 2. Supratentorial brain appears stable, negative. Electronically Signed: By: Odessa Fleming M.D. On: 03/03/2021 08:42   CT HEAD WO CONTRAST  Result Date: 03/01/2021 CLINICAL DATA:  Stroke  follow-up. EXAM: CT HEAD WITHOUT CONTRAST TECHNIQUE: Contiguous axial images were obtained from the base of the skull through the vertex without intravenous contrast. COMPARISON:  CT head and MRI Feb 23, 2021. FINDINGS: Brain: Increased edema associated with the right cerebellar and right dorsal midbrain infarct. Increased resulting mass effect on the fourth ventricle without hydrocephalus. Interval development of acute hemorrhage at the site of right cerebellar infarct. No evidence of new/interval acute large vascular territory infarct. Remote left cerebellar infarct. Basal cisterns are patent. No inferior cerebellar tonsillar herniation or midline shift. No mass lesion. Vascular: No hyperdense vessel identified. Skull: No acute fracture. Sinuses/Orbits: Mucosal thickening the left maxillary sinus, right posterior ethmoid air cell and left sphenoid sinus with frothy secretions in the sinuses. Unremarkable orbits. Other: No mastoid effusions. IMPRESSION: 1. Increased edema associated with the right cerebellar and right dorsal midbrain infarcts with interval development of acute hemorrhage at the site of right cerebellar infarct. Increased resulting mass effect on the fourth ventricle without hydrocephalus. 2. Paranasal sinus mucosal thickening with frothy secretions, as detailed above. Electronically Signed   By: Feliberto Harts MD   On: 03/01/2021 14:39   MR BRAIN WO CONTRAST  Result Date: 03/03/2021 CLINICAL DATA:  Stroke follow-up EXAM: MRI HEAD WITHOUT CONTRAST TECHNIQUE: Multiplanar, multiecho pulse sequences of the brain and  surrounding structures were obtained without intravenous contrast. COMPARISON:  Head CT Mar 03, 2021. FINDINGS: Brain: Large area of restricted diffusion involving the inferior right cerebellar hemisphere consistent with acute/subacute right PICA territory infarct. Patchy T1 hyperintensity with prominent susceptibility artifact in the superior aspect of the right cerebellar hemisphere with surrounding vasogenic edema is suggestive of hemorrhagic transformation of non prior right SCA territory infarct. Edema and mass effect from these 2 areas of infarcts resulting in leftward midline shift with mass effect on the fourth ventricle which remains patent as well as mass effect on the posterior aspect of the pons and mid brain with mild periaqueductal edema and small ascending transtentorial herniation. No tonsillar herniation. No hydrocephalus. Overall, findings are similar to prior CT performed earlier today Area of encephalomalacia and gliosis with hemosiderin staining in the superior left cerebellar hemisphere consistent with chronic left SCA territory infarct. Vascular: Normal flow voids. Skull and upper cervical spine: Normal marrow signal. Sinuses/Orbits: Mucosal thickening with bubbly secretion in the left maxillary and sphenoid sinuses. The orbits are maintained. Other: Right mastoid effusion. IMPRESSION: Large acute/subacute right PICA territory infarct and late subacute right SCA territory infarct with superimposed hemorrhagic transformation. Edema from of right cerebellar hemisphere infarcts results in leftward midline shift with mass effect on the fourth ventricle, mid brain and pons without evidence of hydrocephalus. Findings appear stable compared to head CT performed earlier today. Electronically Signed   By: Baldemar Lenis M.D.   On: 03/03/2021 13:13   MR BRAIN WO CONTRAST  Result Date: 02/23/2021 CLINICAL DATA:  Dizziness, nausea and vomiting. EXAM: MRI HEAD WITHOUT CONTRAST TECHNIQUE:  Multiplanar, multiecho pulse sequences of the brain and surrounding structures were obtained without intravenous contrast. COMPARISON:  CT studies same day FINDINGS: Brain: Diffusion imaging shows a 4-5 cm region of acute infarction within the superior cerebellum on the right. Acute infarction also within the dorsal right midbrain. Old infarction within the left superior cerebellum. Mild swelling but no acute hemorrhage in the right cerebellum. Old hemosiderin residual within the left cerebellar stroke. Cerebral hemispheres are normal. No mass, hydrocephalus or extra-axial collection. Vascular: No acute vascular finding by standard MRI. Skull and upper cervical  spine: Negative Sinuses/Orbits: Mucosal inflammatory changes of the left maxillary and sphenoid sinuses. Orbits negative. Other: None IMPRESSION: 4-5 cm region of acute infarction within the superior cerebellum on the right. Punctate acute infarction in the right dorsal midbrain. Mild swelling. No evidence of acute hemorrhage. Old infarction in the left superior cerebellum with atrophy, encephalomalacia and gliosis and hemosiderin deposition. Electronically Signed   By: Paulina Fusi M.D.   On: 02/23/2021 08:42   ECHOCARDIOGRAM COMPLETE  Result Date: 02/23/2021    ECHOCARDIOGRAM REPORT   Patient Name:   [facility] Date of Exam: 02/23/2021 Medical Rec #:  [id number]   Height:       62.0 in Accession #:    [id number]  Weight:       136.9 lb Date of Birth:  1961-09-18  BSA:          1.627 m Patient Age:    59 years    BP:           135/72 mmHg Patient Gender: F           HR:           64 bpm. Exam Location:  Inpatient Procedure: 2D Echo, Cardiac Doppler and Color Doppler Indications:    Stroke  History:        Patient has prior history of Echocardiogram examinations, most                 recent 01/30/2020. Stroke; Risk Factors:Hypertension.  Sonographer:    Shirlean Kelly Referring Phys: [id number] CAROLE N HALL IMPRESSIONS  1. Left ventricular ejection  fraction, by estimation, is 60 to 65%. The left ventricle has normal function. The left ventricle has no regional wall motion abnormalities. Left ventricular diastolic parameters are consistent with Grade I diastolic dysfunction (impaired relaxation).  2. Right ventricular systolic function is normal. The right ventricular size is normal.  3. Left atrial size was mildly dilated.  4. The mitral valve is normal in structure. No evidence of mitral valve regurgitation. No evidence of mitral stenosis.  5. The aortic valve is normal in structure. Aortic valve regurgitation is trivial. No aortic stenosis is present.  6. The inferior vena cava is normal in size with greater than 50% respiratory variability, suggesting right atrial pressure of 3 mmHg. Comparison(s): No significant change from prior study. Prior images reviewed side by side. FINDINGS  Left Ventricle: Left ventricular ejection fraction, by estimation, is 60 to 65%. The left ventricle has normal function. The left ventricle has no regional wall motion abnormalities. The left ventricular internal cavity size was normal in size. There is  no left ventricular hypertrophy. Left ventricular diastolic parameters are consistent with Grade I diastolic dysfunction (impaired relaxation). Indeterminate filling pressures. Right Ventricle: The right ventricular size is normal. No increase in right ventricular wall thickness. Right ventricular systolic function is normal. Left Atrium: Left atrial size was mildly dilated. Right Atrium: Right atrial size was normal in size. Pericardium: There is no evidence of pericardial effusion. Mitral Valve: The mitral valve is normal in structure. No evidence of mitral valve regurgitation. No evidence of mitral valve stenosis. Tricuspid Valve: The tricuspid valve is normal in structure. Tricuspid valve regurgitation is trivial. No evidence of tricuspid stenosis. Aortic Valve: The aortic valve is normal in structure. Aortic valve  regurgitation is trivial. No aortic stenosis is present. Aortic valve mean gradient measures 3.0 mmHg. Aortic valve peak gradient measures 5.9 mmHg. Aortic valve area, by VTI measures 2.32 cm. Pulmonic Valve: The  pulmonic valve was normal in structure. Pulmonic valve regurgitation is not visualized. No evidence of pulmonic stenosis. Aorta: The aortic root is normal in size and structure. Venous: The inferior vena cava is normal in size with greater than 50% respiratory variability, suggesting right atrial pressure of 3 mmHg. IAS/Shunts: No atrial level shunt detected by color flow Doppler.  LEFT VENTRICLE PLAX 2D LVIDd:         3.90 cm  Diastology LVIDs:         2.50 cm  LV e' medial:    7.18 cm/s LV PW:         1.10 cm  LV E/e' medial:  11.9 LV IVS:        1.10 cm  LV e' lateral:   8.38 cm/s LVOT diam:     1.80 cm  LV E/e' lateral: 10.2 LV SV:         54 LV SV Index:   33 LVOT Area:     2.54 cm  RIGHT VENTRICLE             IVC RV S prime:     18.40 cm/s  IVC diam: 1.20 cm TAPSE (M-mode): 3.0 cm LEFT ATRIUM             Index       RIGHT ATRIUM           Index LA diam:        3.80 cm 2.34 cm/m  RA Area:     12.80 cm LA Vol (A2C):   28.7 ml 17.64 ml/m RA Volume:   29.20 ml  17.94 ml/m LA Vol (A4C):   34.8 ml 21.39 ml/m LA Biplane Vol: 32.4 ml 19.91 ml/m  AORTIC VALVE AV Area (Vmax):    2.20 cm AV Area (Vmean):   2.19 cm AV Area (VTI):     2.32 cm AV Vmax:           121.50 cm/s AV Vmean:          81.900 cm/s AV VTI:            0.233 m AV Peak Grad:      5.9 mmHg AV Mean Grad:      3.0 mmHg LVOT Vmax:         105.00 cm/s LVOT Vmean:        70.400 cm/s LVOT VTI:          0.212 m LVOT/AV VTI ratio: 0.91  AORTA Ao Root diam: 2.50 cm Ao Asc diam:  2.90 cm MITRAL VALVE                TRICUSPID VALVE MV Area (PHT): 3.17 cm     TR Peak grad:   20.8 mmHg MV Decel Time: 239 msec     TR Vmax:        228.00 cm/s MV E velocity: 85.10 cm/s MV A velocity: 118.00 cm/s  SHUNTS MV E/A ratio:  0.72         Systemic VTI:  0.21  m                             Systemic Diam: 1.80 cm Rachelle Hora Croitoru MD Electronically signed by Thurmon Fair MD Signature Date/Time: 02/23/2021/4:12:19 PM    Final    CT HEAD CODE STROKE WO CONTRAST  Result Date: 02/23/2021 CLINICAL DATA:  Code stroke. Initial evaluation for neuro deficit, stroke suspected. EXAM: CT  HEAD WITHOUT CONTRAST TECHNIQUE: Contiguous axial images were obtained from the base of the skull through the vertex without intravenous contrast. COMPARISON:  Prior CT from 01/31/2020. FINDINGS: Brain: Streak/beam hardening artifact emanating from the left occipital calvarium noted, mildly limiting assessment. Cerebral volume within normal limits. Focal encephalomalacia at the central and left superior cerebellum, consistent with a chronic left MCA distribution infarct. Subtle evolving hypodensity noted involving the superior right cerebellum and cerebellar vermis, concerning for an acute right SCA distribution infarct (series 3, image 12, 11). No associated hemorrhage or mass effect. No other evidence for acute large vessel territory infarct. No intracranial hemorrhage. No mass lesion, mass effect or midline shift. No hydrocephalus or extra-axial fluid collection. Vascular: No hyperdense vessel. Skull: Scalp soft tissues demonstrate no acute finding. Small focal exostosis noted at the right frontal calvarium. Calvarium intact. Sinuses/Orbits: Globes and orbital soft tissues within normal limits. Moderate left sphenoid and maxillary sinusitis. Additional scattered pneumatized secretions noted within the ethmoidal air cells. Subcentimeter osteoma noted within the right ethmoidal air cells as well. Mastoid air cells are clear. Other: None. ASPECTS [facility] Stroke Program Early CT Score) - Ganglionic level infarction (caudate, lentiform nuclei, internal capsule, insula, M1-M3 cortex): 7 - Supraganglionic infarction (M4-M6 cortex): 3 Total score (0-10 with 10 being normal): 10 IMPRESSION: 1. Evolving  hypodensity involving the superior right cerebellum/cerebellar vermis, concerning for an acute right SCA distribution infarct. No intracranial hemorrhage or mass effect. 2. ASPECTS is 10. 3. Underlying chronic left SCA distribution infarcts. Critical Value/emergent results were called by telephone at the time of interpretation on 02/23/2021 at 12:55 a.m. to provider Dr. Amada Jupiter, Who verbally acknowledged these results. Electronically Signed   By: Rise Mu M.D.   On: 02/23/2021 01:23   CT ANGIO HEAD NECK W WO CM (CODE STROKE)  Result Date: 02/23/2021 CLINICAL DATA:  Initial evaluation for neuro deficit, stroke suspected. EXAM: CT ANGIOGRAPHY HEAD AND NECK TECHNIQUE: Multidetector CT imaging of the head and neck was performed using the standard protocol during bolus administration of intravenous contrast. Multiplanar CT image reconstructions and MIPs were obtained to evaluate the vascular anatomy. Carotid stenosis measurements (when applicable) are obtained utilizing NASCET criteria, using the distal internal carotid diameter as the denominator. CONTRAST:  75mL OMNIPAQUE IOHEXOL 350 MG/ML SOLN COMPARISON:  Prior head CT from earlier the same day. FINDINGS: CTA NECK FINDINGS Aortic arch: Visualized aortic arch normal in caliber with normal branch pattern. No stenosis about the origin of the great vessels. Right carotid system: Right common carotid artery patent from its origin to the bifurcation without stenosis. Minimal plaque about the right carotid bulb without stenosis. Right ICA patent distally without stenosis, dissection or occlusion. Left carotid system: Left CCA patent from its origin to the bifurcation without stenosis. Minimal plaque about the left carotid bulb without significant stenosis. Left ICA patent distally without stenosis, dissection or occlusion. Vertebral arteries: Left vertebral artery arises directly from the aortic arch. Right vertebral artery dominant. Vertebral arteries  widely patent within the neck. Just prior to the skull base/dural reflection, there is distal right V3 segment (series 9, image 83), suspicious for a possible short-segment dissection. Small amount of probable intraluminal thrombus protrudes into the vascular lumen at this level. No frank raised dissection flap is seen. No more than mild stenosis at this level. The right vertebral artery is relatively normal just distally as it crosses into the cranial vault. Skeleton: No acute osseous finding. No discrete or worrisome osseous lesions. Mild cervical spondylosis noted at C5-6 without  significant stenosis. Other neck: No other acute soft tissue abnormality within the neck. No mass or adenopathy. Upper chest: Visualized upper chest demonstrates no acute finding. Review of the MIP images confirms the above findings CTA HEAD FINDINGS Anterior circulation: Petrous, cavernous, and supraclinoid segments patent without stenosis or other abnormality. A1 segments widely patent. Normal anterior communicating artery complex. Anterior cerebral arteries patent to their distal aspects without stenosis. No M1 stenosis or occlusion. Normal MCA bifurcations. Distal MCA branches well perfused and symmetric. Posterior circulation: Both V4 segments patent to the vertebrobasilar junction without stenosis. Both PICA origins patent and normal. Basilar somewhat diminutive but patent to its distal aspect without stenosis. Left superior cerebral artery patent. There is occlusion of the mid-distal right superior cerebellar artery, likely embolic (series 8, image 105). Left PCA supplied via the basilar as well as a prominent left posterior communicating artery. Predominant fetal type origin of the right PCA. Both PCAs remain widely patent to their distal aspects. Venous sinuses: Grossly patent allowing for timing the contrast bolus. Anatomic variants: Predominant fetal type origin of the right PCA. Review of the MIP images confirms the above  findings IMPRESSION: 1. Focal intimal irregularity involving the distal right V3 segment, likely reflecting a short-segment dissection. No frank raised dissection flap or significant luminal narrowing. 2. 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 3. Mild for age atheromatous disease elsewhere about the major arterial vasculature of the head and neck. No other hemodynamically significant or correctable stenosis. These results were communicated to Dr. Amada Jupiter at 1:22 amon 5/10/2022by text page via the [facility] messaging system. Electronically Signed   By: Rise Mu M.D.   On: 02/23/2021 01:38    PHYSICAL EXAM  Temp:  [97.9 F (36.6 C)-99 F (37.2 C)] 97.9 F (36.6 C) (05/27 0505) Pulse Rate:  [81-89] 81 (05/27 0505) Resp:  [16-19] 19 (05/27 0505) BP: (109-122)/(69-81) 122/81 (05/27 0505) SpO2:  [99 %-100 %] 100 % (05/27 0505)  General - well nourished, well developed, in no apparent distress.    Ophthalmologic - fundi not visualized due to noncooperation.    Cardiovascular - regular rhythm and rate  Mental Status -  Level of arousal and orientation to time, place, and person were intact. Language including expression, naming, repetition, comprehension, reading, and writing was assessed and found intact Fund of Knowledge was assessed and was intact.  Cranial Nerves II - XII - II - Vision intact OU. III, IV, VI - Extraocular movements intact, but with subjective binocular diplopia bilateral gaze V - Facial sensation intact bilaterally. VII - Facial movement intact bilaterally. VIII - few beats of gaze evoked nystagmus on bilateral gaze, L>R X - Palate elevates symmetrically. XI - Chin turning & shoulder shrug intact bilaterally. XII - Tongue protrusion intact.  Motor Strength - The patient's strength was normal in all extremities and pronator drift was absent.   Motor Tone & Bulk - Muscle tone was assessed at the neck  and appendages and was normal.  Bulk was normal and fasciculations were absent.   Reflexes - The patient's reflexes were normal in all extremities and she had no pathological reflexes.  Sensory - Light touch, temperature/pinprick were assessed and were normal.    Coordination - The patient had normal movements in the left hand and feet with no ataxia or dysmetria.    Slight right finger-to-nose dysmetria .  Tremor was absent.  Gait and Station - deferred   ASSESSMENT/PLAN Judy Gutierrez is a 60 y.o.  female with history of recurrent stroke, hypertension admitted on 02/26/2021 for rehab due to right cerebellar infarct. She has been working with therapist pretty well resolved vertigo or N/V. however, developed acute onset vertigo and N/V on 03/01/21. Stroke team was called back for evaluation.   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    MRI confirmed right superior cerebellum moderate infarct and punctate dorsal midbrain infarct.    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 Edema from of right cerebellar hemisphere infarcts results in leftward midline shift with mass effect on the fourth ventricle, mid brain and pons without evidence of hydrocephalus. CT head  03/10/2021: Large right PICA territory infarct with increased acute hemorrhagic transformation associated with a right superior cerebellar infarct and new intraventricular extension of hemorrhage into the fourth ventricle. Increased edema in the superior right cerebellum surrounding the hemorrhage.Progressive ventriculomegaly, compatible with developing obstructive hydrocephalus.  CT head stable right PICA and SCA infarcts, no hydrocephalus.  Loop recorder interrogation negative.    LDL 51  A1c 6.0.   echo 02/23/21 EF 60 to 65%.  No cardiac source of embolism.Marland Kitchen    Hypercoagulable work-up repeated again negative.    Was on aspirin 81 and the Brilinta 90 twice daily for 4 weeks and then Brilinta alone. Given hemorrhagic conversion, brilinta discontinued, continued on ASA 81. With new infarct and possible dissection, added plavix 5/19 after CT stable but stopped after follow-up CT showed worsening of hemorrhage..   Ongoing aggressive stroke risk factor management  Therapy recommendations:  CIR  Disposition:  Pending   Hx of stroke  left cerebellar stroke in 01/2020 status post tPA admitted to [facility].  At that time, CTA head and neck no LVO or stenosis, MRI showed left superior cerebellar infarct.  EF normal range.  TCD bubble study negative.  LDL 138, A1c 6.0.  Hypercoagulable work-up negative.  Bilateral lower extremity DVT negative.  Discharged with DAPT for 3 weeks and then aspirin alone, as well as Lipitor 40.  After discharge she follow-up with Dr. [name] at [facility], had loop recorder placed in 02/2020.  Hypertension  Stable  Resumed amlodipine 5mg   BP goal < 160 given hemorrhagic conversion  Long-term BP goal normotensive  Hyperlipidemia  Home meds:  Lipitor 40mg   LDL 51, at goal < 70  Continue lipitor 40  Continue statin at discharge  Other Stroke Risk Factors    Other Active Problems    Hospital day # 14 Patient appears to be neurologically stable and follow-up CT  scan from today shows improvement in hydrocephalus and hemorrhagic transformation and cytotoxic edema.  Patient will need diagnostic cerebral catheter angiogram at some point but given hemorrhagic transformation she is not a candidate for aggressive antiplatelet therapy at the present time hence will wait and schedule this later after she has finished inpatient rehab.  Recommend repeat CT scan prior  to discharge from rehab and start aspirin at that time.  If neurological can condition declines may need transfer back to internal medicine in acute care side with management of cytotoxic edema and neurosurgical consultation for consideration for emergent ventriculostomy or posterior fossa decompression.   cerebral catheter angiogram and revascularization will have to wait till her condition stabilizes..    Long discussion with patient and Dr. Maryla Morrow and answered questions.  Plan to resume aspirin next week prior to discharge.  Greater than 50% time during this 25-minute visit was spent in counseling and coordination of care about her imaging stroke and suspected dissection and answering questions Judy Heady, MD Stroke Neurology 03/12/2021 12:02 PM    To contact Stroke Continuity provider, please refer to [email or web address]. After hours, contact General Neurology

## 2021-03-13 NOTE — Progress Notes (Signed)
PROGRESS NOTE   Subjective/Complaints: Had a good night. Had questions about dc plan. Doing well overall  ROS: Patient denies fever, rash, sore throat, blurred vision, nausea, vomiting, diarrhea, cough, shortness of breath or chest pain, joint or back pain, headache, or mood change.   Objective:   CT HEAD WO CONTRAST  Result Date: 03/12/2021 CLINICAL DATA:  Stroke follow-up EXAM: CT HEAD WITHOUT CONTRAST TECHNIQUE: Contiguous axial images were obtained from the base of the skull through the vertex without intravenous contrast. 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 No hemorrhage in this infarct Relatively large area of acute/subacute infarct in the right superior cerebellar artery territory with hemorrhagic transformation, unchanged. This infarct shows diffuse edema and considerable mass-effect on the fourth ventricle which is flattened and displaced to the left. There is mild obstructive hydrocephalus, slightly improved from the prior study. No new area of infarct or hemorrhage since yesterday. Vascular: Negative for hyperdense vessel Skull: Negative Sinuses/Orbits: Moderate mucosal edema left maxillary sinus unchanged. Remaining sinuses clear. 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 Electronically Signed   By: Marlan Palau M.D.   On: 03/12/2021 07:44   Recent Labs    03/11/21 0500  WBC 5.3  HGB 11.9*  HCT 35.7*  PLT 293   Recent Labs    03/11/21 0500  NA 137  K 3.8  CL 102  CO2 27  GLUCOSE 117*  BUN 12  CREATININE 0.65  CALCIUM 9.0     Intake/Output Summary (Last 24 hours) at 03/13/2021 0845 Last data filed at 03/12/2021 1230 Gross per 24 hour  Intake 200 ml  Output --  Net 200 ml        Physical Exam: Vital Signs Blood pressure 116/70, pulse 88, temperature 98.2 F (36.8 C), resp. rate 16, height 5\' 2"  (1.575 m), weight 58.1 kg, last menstrual period 12/08/2010, SpO2 99 %.  Constitutional: No distress . Vital signs reviewed. HEENT: EOMI, oral membranes moist Neck: supple Cardiovascular: RRR without murmur. No JVD    Respiratory/Chest: CTA Bilaterally without wheezes or rales. Normal effort    GI/Abdomen: BS +, non-tender, non-distended Ext: no clubbing, cyanosis, or edema Psych: pleasant and cooperative Musc: No edema in extremities.  No tenderness in extremities. Neuro: Alert, left eye glass lens taped. Motor: 5/5 throughout RUE dysmetria and ataxia, stable LUE dysmetria improved Dysarthria improving. Normal language and functional cognition  Assessment/Plan: 1. Functional deficits which require 3+ hours per day of interdisciplinary therapy in a comprehensive inpatient rehab setting.  Physiatrist is providing close team supervision and 24 hour management of active medical problems listed below.  Physiatrist and rehab team continue to assess barriers to discharge/monitor patient progress toward functional and medical goals  Care Tool:  Bathing    Body parts bathed by patient: Right arm,Right lower leg,Left arm,Chest,Abdomen,Front perineal area,Buttocks,Left upper leg,Left lower leg,Face,Right upper leg         Bathing assist Assist  Level: Supervision/Verbal cueing     Upper Body Dressing/Undressing Upper body dressing   What is the patient wearing?: Pull over shirt,Bra    Upper body assist Assist Level: Minimal Assistance - Patient > 75% (for hooking bra)    Lower Body Dressing/Undressing Lower body dressing      What is the patient wearing?: Underwear/pull up,Pants     Lower body  assist Assist for lower body dressing: Supervision/Verbal cueing     Toileting Toileting    Toileting assist Assist for toileting: Contact Guard/Touching assist     Transfers Chair/bed transfer  Transfers assist     Chair/bed transfer assist level: Contact Guard/Touching assist     Locomotion Ambulation   Ambulation assist      Assist level: Minimal Assistance - Patient > 75% Assistive device: Walker-rolling Max distance: 150   Walk 10 feet activity   Assist     Assist level: Minimal Assistance - Patient > 75% Assistive device: Walker-rolling   Walk 50 feet activity   Assist    Assist level: Minimal Assistance - Patient > 75% Assistive device: Walker-rolling    Walk 150 feet activity   Assist    Assist level: Minimal Assistance - Patient > 75% Assistive device: Walker-rolling    Walk 10 feet on uneven surface  activity   Assist Walk 10 feet on uneven surfaces activity did not occur: Safety/medical concerns   Assist level: Minimal Assistance - Patient > 75% Assistive device: Photographer Will patient use wheelchair at discharge?: Yes Type of Wheelchair: Manual    Wheelchair assist level: Contact Guard/Touching assist Max wheelchair distance: 200    Wheelchair 50 feet with 2 turns activity    Assist        Assist Level: Contact Guard/Touching assist   Wheelchair 150 feet activity     Assist      Assist Level: Contact Guard/Touching assist    Medical Problem List and Plan: 1.  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 Increased edema associated with the right cerebellar and right dorsal midbrain infarcts with interval development of acute hemorrhage at the site of  right cerebellar infarct. Increased resulting mass effect on the fourth ventricle without hydrocephalus.      2. Paranasal sinus mucosal thickening with frothy secretions.  Repeat CT on 5/17 showing new right PICA infarct, repeat on 5/19 showing stability, repeat CT on 5/21 shows worsening/progression of cytotoxic edema from right PICA infarct, hemorrhagic transformation of right SCA infarct, and hydrocephalus, repeat CT later on 5/20 stable.   Repeat head CT on 5/25 showing large right PICA territory infarct with increased hemorrhagic transformation and new intraventricular extension of hemorrhage into fourth ventricle with increasing edema.  Also progressive ventriculomegaly with developing obstructive hydrocephalus.  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  Leftward midline shift with mass-effect on fourth ventricle, but no hydrocephalus  Appreciate Neuro recs, discussed with Neuro - following closely  May require emergent ventriculostomy versus decompression if decompensated.   5/28--appears stable. Continue to closely  monitor 2.  Antithrombotics: -DVT/anticoagulation: Lovenox, d/ced d/t bleed              -antiplatelet therapy: Aspirin 81 mg daily d/ced as well due to increasing hemorrhage, Brilinta d/ced, Plavix initiated, but also DC'd on 5/21 due to progression of hemorrhage 3. Pain Management: Continue Tylenol as needed             Monitor, particularly for headaches with increased exertion  Controlled on 5/28 4. Mood: Provide emotional support             -antipsychotic agents: N/A 5. Neuropsych: This patient is capable of making decisions on her own behalf. 6. Skin/Wound Care: Routine skin checks 7. Fluids/Electrolytes/Nutrition: Routine in and outs 8.  Hypertension.   Patient on Cozaar50 mg daily, Norvasc 5 mg daily prior to admission.    Norvasc 5 started on 5/16, continue             Vitals with BMI 03/13/2021 03/12/2021 03/12/2021  Height - - -  Weight - - -  BMI - - -  Systolic 116 116 989  Diastolic 70 73 73  Pulse 88 93 110   Controlled on 5/28 9. Hyperlipidemia: Lipitor 10. ABLA: Resolved             Hb 11.9 on 5/26, labs ordered for Monday  Cont to monitor 12. Transaminitis: Resolved  Cont to monitor 13. Dysarthria  Continue SLP 14.  Hyponatremia: Resolved 15.  Tachycardia  ECG reviewed, no significant change in ECG from prior  Improved  LOS: 15 days A FACE TO FACE EVALUATION WAS PERFORMED  Ranelle Oyster 03/13/2021, 8:45 AM

## 2021-03-13 NOTE — Progress Notes (Signed)
STROKE TEAM PROGRESS NOTE   SUBJECTIVE (INTERVAL HISTORY) Husband at bedside.  Patient lying in bed, no acute event overnight.  Still has diplopia but slowly improving, denies any headache, nausea vomiting or dizziness.  CT pending on Monday  OBJECTIVE Temp:  [97.5 F (36.4 C)-98.8 F (37.1 C)] 97.5 F (36.4 C) (05/28 1921) Pulse Rate:  [88-89] 88 (05/28 1921) Resp:  [16-18] 18 (05/28 1921) BP: (116-119)/(67-77) 119/67 (05/28 1921) SpO2:  [99 %-100 %] 100 % (05/28 1921)  No results for input(s): GLUCAP in the last 168 hours. Recent Labs  Lab 03/08/21 0945 03/11/21 0500  NA 135 137  K 3.9 3.8  CL 102 102  CO2 25 27  GLUCOSE 134* 117*  BUN 15 12  CREATININE 0.58 0.65  CALCIUM 9.0 9.0   Recent Labs  Lab 03/11/21 0500  AST 36  ALT 30  ALKPHOS 61  BILITOT 1.1  PROT 7.1  ALBUMIN 3.2*   Recent Labs  Lab 03/08/21 0945 03/11/21 0500  WBC 6.0 5.3  NEUTROABS 3.6 2.4  HGB 12.4 11.9*  HCT 36.8 35.7*  MCV 86.8 86.4  PLT 299 293   No results for input(s): CKTOTAL, CKMB, CKMBINDEX, TROPONINI in the last 168 hours. No results for input(s): LABPROT, INR in the last 72 hours. No results for input(s): COLORURINE, LABSPEC, PHURINE, GLUCOSEU, HGBUR, BILIRUBINUR, KETONESUR, PROTEINUR, UROBILINOGEN, NITRITE, LEUKOCYTESUR in the last 72 hours.  Invalid input(s): APPERANCEUR     Component Value Date/Time   CHOL 105 02/23/2021 0745   CHOL 191 09/09/2015 0804   TRIG 28 02/23/2021 0745   HDL 48 02/23/2021 0745   HDL 60 09/09/2015 0804   CHOLHDL 2.2 02/23/2021 0745   VLDL 6 02/23/2021 0745   LDLCALC 51 02/23/2021 0745   LDLCALC 120 (H) 09/09/2015 0804   Lab Results  Component Value Date   HGBA1C 6.0 (H) 02/23/2021      Component Value Date/Time   LABOPIA NONE DETECTED 02/23/2021 0033   COCAINSCRNUR NONE DETECTED 02/23/2021 0033   LABBENZ NONE DETECTED 02/23/2021 0033   AMPHETMU NONE DETECTED 02/23/2021 0033   THCU NONE DETECTED 02/23/2021 0033   LABBARB NONE DETECTED  02/23/2021 0033    No results for input(s): ETH in the last 168 hours.  I have personally reviewed the radiological images below and agree with the radiology interpretations.  CT ANGIO HEAD NECK W WO CM  Result Date: 03/03/2021 CLINICAL DATA:  Stroke follow-up. EXAM: CT ANGIOGRAPHY HEAD AND NECK TECHNIQUE: Multidetector CT imaging of the head and neck was performed using the standard protocol during bolus administration of intravenous contrast. Multiplanar CT image reconstructions and MIPs were obtained to evaluate the vascular anatomy. Carotid stenosis measurements (when applicable) are obtained utilizing NASCET criteria, using the distal internal carotid diameter as the denominator. CONTRAST:  42mL OMNIPAQUE IOHEXOL 350 MG/ML SOLN COMPARISON:  CT angiogram of the head and neck Feb 23, 2021. FINDINGS: CTA NECK FINDINGS Aortic arch: 4 vessel aortic arch with direct origin of the left vertebral artery from the aortic arch in. Imaged portion shows no evidence of aneurysm or dissection. No significant stenosis of the major arch vessel origins. Right carotid system: Minimal atherosclerotic changes of the right carotid bifurcation without hemodynamically significant stenosis. Left carotid system: Mild atherosclerotic changes of the left carotid bifurcation without hemodynamically significant stenosis. Vertebral arteries: Right dominant. The non dominant left vertebral artery has normal course and caliber from the aortic arch to the vertebrobasilar junction. Area of luminal irregularity is again seen in the V3  segment of the right vertebral artery now with mild stenosis followed by small (1-2 mm) outpouching suggesting pseudoaneurysm. There is also luminal irregularity at the V4 segment. Skeleton: Negative. Other neck: Negative. Upper chest: Negative. Review of the MIP images confirms the above findings CTA HEAD FINDINGS Anterior circulation: No significant stenosis, proximal occlusion, aneurysm, or vascular  malformation. Posterior circulation: Mild luminal irregularity of the V4 segment of the right vertebral artery which involves the origin of the right PICA which appear patent. The basilar artery is small in caliber without area of focal stenosis, likely related to the presence of bilateral prominent posterior communicating arteries. The right P1/PCA segment is absent (fetal PCA). Interval recanalization of the right superior cerebellar artery. Venous sinuses: As permitted by contrast timing, patent. Anatomic variants: Right fetal PCA. Prominent left posterior communicating artery. Review of the MIP images confirms the above findings IMPRESSION: 1. Luminal irregularity in the V3 and V4 segments of the right vertebral artery is suggestive of dissection without hemodynamically significant stenosis. Area of irregularity of the origin of the right PICA which remains patent. There is a new diminutive outpouching from the right V3 segment suggestive of a small pseudoaneurysm. 2. Interval recanalization of the right superior cerebellar artery. 3. No significant findings in the anterior circulation. Electronically Signed   By: Baldemar Lenis M.D.   On: 03/03/2021 12:54   CT HEAD WO CONTRAST  Result Date: 03/12/2021 CLINICAL DATA:  Stroke follow-up EXAM: CT HEAD WITHOUT CONTRAST TECHNIQUE: Contiguous axial images were obtained from the base of the skull through the vertex without intravenous contrast. 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 No hemorrhage in this infarct Relatively large area of acute/subacute infarct in the right superior cerebellar artery territory with hemorrhagic transformation, unchanged. This infarct shows diffuse edema and considerable mass-effect on the fourth ventricle which is flattened and displaced to the left. There is mild obstructive hydrocephalus, slightly improved from the prior study. No new area  of infarct or hemorrhage since yesterday. Vascular: Negative for hyperdense vessel Skull: Negative Sinuses/Orbits: Moderate mucosal edema left maxillary sinus unchanged. Remaining sinuses clear. 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 Electronically Signed   By: Marlan Palau M.D.   On: 03/12/2021 07:44   CT Head Wo Contrast  Result Date: 03/10/2021 CLINICAL DATA:  Mar 06, 2021. EXAM: CT HEAD WITHOUT CONTRAST TECHNIQUE: Contiguous axial images were obtained from the base of the skull through the vertex without intravenous contrast. COMPARISON:  CT head Mar 06, 2021. FINDINGS: Brain: Large area of hypodensity involving the inferior right cerebellum, compatible with PICA territory infarct. Increased acute hemorrhage associated with the right superior cerebellar infarct, compatible with worsening hemorrhagic transformation. There is new intraventricular extension of hemorrhage into the fourth ventricle (series 3, image 9 and series 6, image 31). There is persistent mass effect on the fourth ventricle which is effaced and displaced to the left. Increased edema in the superior right cerebellum surrounding the hemorrhage. There is increasing ventriculomegaly with increased rounding of the temporal horns and outwardly convex third ventricle, compatible with worsening hydrocephalus. No supratentorial midline shift. No definite interval/new large vascular territory infarct. Vascular: No hyperdense vessel identified. Calcific atherosclerosis. Skull: No acute fracture. Sinuses/Orbits: Paranasal sinus mucosal thickening  with air-fluid levels in the left maxillary and left sphenoid sinuses. Right ethmoid osteoma. Other: Clear  mastoid air cells. IMPRESSION: 1. 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 Increased edema in the superior right cerebellum surrounding the hemorrhage. 2. Progressive ventriculomegaly, compatible with developing obstructive hydrocephalus. These results will be called to the ordering clinician or representative by the Radiologist Assistant, and communication documented in the PACS or [employer]. Electronically Signed   By: Feliberto Harts MD   On: 03/10/2021 07:33   CT HEAD WO CONTRAST  Result Date: 03/06/2021 CLINICAL DATA:  Stroke follow-up EXAM: CT HEAD WITHOUT CONTRAST TECHNIQUE: Contiguous axial images were obtained from the base of the skull through the vertex without intravenous contrast. COMPARISON:  CT head 03/16/2021 FINDINGS: Brain: Large hypodensity right inferior cerebellum compatible with PICA infarct unchanged. 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 There is mass-effect on the fourth ventricle which is effaced and mildly displaced to the left. Mild ventricular enlargement is present unchanged from earlier today but progressed since 02/23/2021 Chronic infarct left superior cerebellum unchanged. Vascular: Negative for hyperdense vessel Skull: Negative Sinuses/Orbits: Mucosal edema paranasal sinuses. Air-fluid level left sphenoid sinus. Osteoma right ethmoid sinus. Mastoid clear bilaterally. Other: None IMPRESSION: Stable CT head. 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 Electronically Signed   By: Marlan Palau M.D.   On: 03/06/2021 15:56   CT HEAD WO CONTRAST  Addendum Date: 03/06/2021   ADDENDUM REPORT: 03/06/2021  07:54 ADDENDUM: Study discussed by telephone with Dr. Iver Nestle on 03/06/2021 at 0611 hours. Electronically Signed   By: Odessa Fleming M.D.   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 Subsequent encounter. EXAM: CT HEAD WITHOUT CONTRAST TECHNIQUE: Contiguous axial images were obtained from the base of the skull through the vertex without intravenous contrast. COMPARISON:  Head CT 03/04/2021 and earlier. FINDINGS: Brain: Lobular hyperdense hemorrhage has progressed in the right SCA territory since 03/04/2021, with increasing regional edema and mass effect compatible with progressive hemorrhagic transformation (coronal image 46). The volume of hyperdense blood there is approximately 3 mL. Confluent right PICA infarct with cytotoxic edema which has mildly progressed since 03/03/2021. Combined, there is mild progressive basilar cistern effacement, although the cisterna magna remains patent. Fourth ventricle remains effaced. Since 03/03/2021 lateral and 3rd ventricle size is slightly increased (series 3, image 14 today versus series 3, image 17 on 03/03/2021). No supratentorial transependymal edema. Supratentorial gray-white matter differentiation preserved with no midline shift. No supratentorial hemorrhage. Vascular: Mild Calcified atherosclerosis at the skull base. Skull: Stable. Small benign right frontal bone exostosis on series 4, image 41. Sinuses/Orbits: Stable paranasal sinuses. Tympanic cavities and mastoids remain clear. Other: Stable leftward gaze deviation. Visualized scalp soft tissues are within normal limits. IMPRESSION: 1. 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 2. At the same time cytotoxic edema from the large right PICA infarct has mildly progressed. 3. Subsequent increased posterior fossa mass although the  pre medullary cistern and cisterna magna remain patent. 4. There has been subtle enlargement of the lateral and 3rd ventricles since 03/03/2021, but there is no transependymal edema. Electronically Signed: By: Rexene Edison  Margo Aye M.D. On: 03/06/2021 06:05   CT HEAD WO CONTRAST  Result Date: 03/04/2021 CLINICAL DATA:  60 year old female with new right PICA infarct superimposed on recent right SCA infarct. EXAM: CT HEAD WITHOUT CONTRAST TECHNIQUE: Contiguous axial images were obtained from the base of the skull through the vertex without intravenous contrast. COMPARISON:  Brain MRI, CTA head and neck, CT head yesterday. FINDINGS: Brain: Supratentorial gray-white matter differentiation remains stable. And the lateral and 3rd ventricle size and configuration is stable with temporal horns remaining diminutive on series 3, image 9. Subacute right SCA infarct with petechial hemorrhage and more acute confluent right PICA infarct with cytotoxic edema redemonstrated. Stable posterior fossa mass effect including on the 4th ventricle. Superimposed chronic left SCA cerebellar infarct. Posterior fossa gray-white matter differentiation and basilar cisterns are stable since 0645 hours yesterday. Vascular: Mild Calcified atherosclerosis at the skull base. Skull: Stable.  No acute osseous abnormality identified. Sinuses/Orbits: Stable occasional sinus mucosal thickening and bubbly opacity. Tympanic cavities and mastoids remain clear. Other: Leftward gaze deviation as before. Visualized scalp soft tissues are within normal limits. IMPRESSION: 1. Stable since yesterday. Confluent Right PICA infarct superimposed on subacute Right SCA infarct with petechial hemorrhage. Stable posterior fossa mass effect. No lateral or 3rd ventriculomegaly at this time. 2. No new intracranial abnormality. Electronically Signed   By: Odessa Fleming M.D.   On: 03/04/2021 06:08   CT HEAD WO CONTRAST  Addendum Date: 03/03/2021   ADDENDUM REPORT: 03/03/2021 08:53  ADDENDUM: Study discussed by telephone with Dr. [name] on 03/03/2021 at 0847 hours. Electronically Signed   By: Odessa Fleming M.D.   On: 03/03/2021 08:53   Result Date: 03/03/2021 CLINICAL DATA:  60 year old female with right SCA cerebellar infarct on MRI 02/23/2021. Subsequent encounter. EXAM: CT HEAD WITHOUT CONTRAST TECHNIQUE: Contiguous axial images were obtained from the base of the skull through the vertex without intravenous contrast. COMPARISON:  Head CT 03/01/2021 and earlier. FINDINGS: Brain: Right superior cerebellar artery territory and cerebellar vermis cytotoxic edema with petechial hemorrhage. But new confluent cytotoxic edema now in the right PICA territory (series 3, image 5). Subsequent increased posterior fossa mass effect, with decreased prepontine and pre medullary cisterns. No tonsillar herniation. Increased effacement of the 4th ventricle, but lateral and 3rd ventricle size not significantly changed at this time. Supratentorial gray and white matter signal is stable. No new intracranial hemorrhage. Vascular: Mild Calcified atherosclerosis at the skull base. No suspicious intracranial vascular hyperdensity. Skull: No acute osseous abnormality identified. Small benign right frontal bone exostosis on series 4, image 59. Sinuses/Orbits: Stable mucosal thickening and bubbly opacity in the left sphenoid, left maxillary, and posterior right ethmoid sinuses. Tympanic cavities and mastoids remain clear. Other: Leftward gaze deviation. Visualized scalp soft tissues are within normal limits. IMPRESSION: 1. Confluent acute right PICA infarct is new since 03/01/2021, superimposed on the recent right SCA infarct. Subsequent increased cytotoxic edema in the right cerebellum with increased posterior fossa mass effect including effaced 4th ventricle. But no malignant hemorrhagic transformation or ventriculomegaly at this time. 2. Supratentorial brain appears stable, negative. Electronically Signed: By: Odessa Fleming  M.D. On: 03/03/2021 08:42   CT HEAD WO CONTRAST  Result Date: 03/01/2021 CLINICAL DATA:  Stroke follow-up. EXAM: CT HEAD WITHOUT CONTRAST TECHNIQUE: Contiguous axial images were obtained from the base of the skull through the vertex without intravenous contrast. COMPARISON:  CT head and MRI Feb 23, 2021. FINDINGS: Brain: Increased edema associated with the right cerebellar and right dorsal midbrain infarct. Increased resulting  mass effect on the fourth ventricle without hydrocephalus. Interval development of acute hemorrhage at the site of right cerebellar infarct. No evidence of new/interval acute large vascular territory infarct. Remote left cerebellar infarct. Basal cisterns are patent. No inferior cerebellar tonsillar herniation or midline shift. No mass lesion. Vascular: No hyperdense vessel identified. Skull: No acute fracture. Sinuses/Orbits: Mucosal thickening the left maxillary sinus, right posterior ethmoid air cell and left sphenoid sinus with frothy secretions in the sinuses. Unremarkable orbits. Other: No mastoid effusions. IMPRESSION: 1. Increased edema associated with the right cerebellar and right dorsal midbrain infarcts with interval development of acute hemorrhage at the site of right cerebellar infarct. Increased resulting mass effect on the fourth ventricle without hydrocephalus. 2. Paranasal sinus mucosal thickening with frothy secretions, as detailed above. Electronically Signed   By: Feliberto Harts MD   On: 03/01/2021 14:39   MR BRAIN WO CONTRAST  Result Date: 03/03/2021 CLINICAL DATA:  Stroke follow-up EXAM: MRI HEAD WITHOUT CONTRAST TECHNIQUE: Multiplanar, multiecho pulse sequences of the brain and surrounding structures were obtained without intravenous contrast. COMPARISON:  Head CT Mar 03, 2021. FINDINGS: Brain: Large area of restricted diffusion involving the inferior right cerebellar hemisphere consistent with acute/subacute right PICA territory infarct. Patchy T1  hyperintensity with prominent susceptibility artifact in the superior aspect of the right cerebellar hemisphere with surrounding vasogenic edema is suggestive of hemorrhagic transformation of non prior right SCA territory infarct. Edema and mass effect from these 2 areas of infarcts resulting in leftward midline shift with mass effect on the fourth ventricle which remains patent as well as mass effect on the posterior aspect of the pons and mid brain with mild periaqueductal edema and small ascending transtentorial herniation. No tonsillar herniation. No hydrocephalus. Overall, findings are similar to prior CT performed earlier today Area of encephalomalacia and gliosis with hemosiderin staining in the superior left cerebellar hemisphere consistent with chronic left SCA territory infarct. Vascular: Normal flow voids. Skull and upper cervical spine: Normal marrow signal. Sinuses/Orbits: Mucosal thickening with bubbly secretion in the left maxillary and sphenoid sinuses. The orbits are maintained. Other: Right mastoid effusion. IMPRESSION: Large acute/subacute right PICA territory infarct and late subacute right SCA territory infarct with superimposed hemorrhagic transformation. Edema from of right cerebellar hemisphere infarcts results in leftward midline shift with mass effect on the fourth ventricle, mid brain and pons without evidence of hydrocephalus. Findings appear stable compared to head CT performed earlier today. Electronically Signed   By: Baldemar Lenis M.D.   On: 03/03/2021 13:13   MR BRAIN WO CONTRAST  Result Date: 02/23/2021 CLINICAL DATA:  Dizziness, nausea and vomiting. EXAM: MRI HEAD WITHOUT CONTRAST TECHNIQUE: Multiplanar, multiecho pulse sequences of the brain and surrounding structures were obtained without intravenous contrast. COMPARISON:  CT studies same day FINDINGS: Brain: Diffusion imaging shows a 4-5 cm region of acute infarction within the superior cerebellum on the  right. Acute infarction also within the dorsal right midbrain. Old infarction within the left superior cerebellum. Mild swelling but no acute hemorrhage in the right cerebellum. Old hemosiderin residual within the left cerebellar stroke. Cerebral hemispheres are normal. No mass, hydrocephalus or extra-axial collection. Vascular: No acute vascular finding by standard MRI. Skull and upper cervical spine: Negative Sinuses/Orbits: Mucosal inflammatory changes of the left maxillary and sphenoid sinuses. Orbits negative. Other: None IMPRESSION: 4-5 cm region of acute infarction within the superior cerebellum on the right. Punctate acute infarction in the right dorsal midbrain. Mild swelling. No evidence of acute hemorrhage. Old infarction in  the left superior cerebellum with atrophy, encephalomalacia and gliosis and hemosiderin deposition. Electronically Signed   By: Paulina Fusi M.D.   On: 02/23/2021 08:42   ECHOCARDIOGRAM COMPLETE  Result Date: 02/23/2021    ECHOCARDIOGRAM REPORT   Patient Name:   [facility] Date of Exam: 02/23/2021 Medical Rec #:  [id number]   Height:       62.0 in Accession #:    [id number]  Weight:       136.9 lb Date of Birth:  04-02-1961  BSA:          1.627 m Patient Age:    59 years    BP:           135/72 mmHg Patient Gender: F           HR:           64 bpm. Exam Location:  Inpatient Procedure: 2D Echo, Cardiac Doppler and Color Doppler Indications:    Stroke  History:        Patient has prior history of Echocardiogram examinations, most                 recent 01/30/2020. Stroke; Risk Factors:Hypertension.  Sonographer:    Shirlean Kelly Referring Phys: [id number] CAROLE N HALL IMPRESSIONS  1. Left ventricular ejection fraction, by estimation, is 60 to 65%. The left ventricle has normal function. The left ventricle has no regional wall motion abnormalities. Left ventricular diastolic parameters are consistent with Grade I diastolic dysfunction (impaired relaxation).  2. Right ventricular  systolic function is normal. The right ventricular size is normal.  3. Left atrial size was mildly dilated.  4. The mitral valve is normal in structure. No evidence of mitral valve regurgitation. No evidence of mitral stenosis.  5. The aortic valve is normal in structure. Aortic valve regurgitation is trivial. No aortic stenosis is present.  6. The inferior vena cava is normal in size with greater than 50% respiratory variability, suggesting right atrial pressure of 3 mmHg. Comparison(s): No significant change from prior study. Prior images reviewed side by side. FINDINGS  Left Ventricle: Left ventricular ejection fraction, by estimation, is 60 to 65%. The left ventricle has normal function. The left ventricle has no regional wall motion abnormalities. The left ventricular internal cavity size was normal in size. There is  no left ventricular hypertrophy. Left ventricular diastolic parameters are consistent with Grade I diastolic dysfunction (impaired relaxation). Indeterminate filling pressures. Right Ventricle: The right ventricular size is normal. No increase in right ventricular wall thickness. Right ventricular systolic function is normal. Left Atrium: Left atrial size was mildly dilated. Right Atrium: Right atrial size was normal in size. Pericardium: There is no evidence of pericardial effusion. Mitral Valve: The mitral valve is normal in structure. No evidence of mitral valve regurgitation. No evidence of mitral valve stenosis. Tricuspid Valve: The tricuspid valve is normal in structure. Tricuspid valve regurgitation is trivial. No evidence of tricuspid stenosis. Aortic Valve: The aortic valve is normal in structure. Aortic valve regurgitation is trivial. No aortic stenosis is present. Aortic valve mean gradient measures 3.0 mmHg. Aortic valve peak gradient measures 5.9 mmHg. Aortic valve area, by VTI measures 2.32 cm. Pulmonic Valve: The pulmonic valve was normal in structure. Pulmonic valve regurgitation  is not visualized. No evidence of pulmonic stenosis. Aorta: The aortic root is normal in size and structure. Venous: The inferior vena cava is normal in size with greater than 50% respiratory variability, suggesting right atrial pressure of 3 mmHg.  IAS/Shunts: No atrial level shunt detected by color flow Doppler.  LEFT VENTRICLE PLAX 2D LVIDd:         3.90 cm  Diastology LVIDs:         2.50 cm  LV e' medial:    7.18 cm/s LV PW:         1.10 cm  LV E/e' medial:  11.9 LV IVS:        1.10 cm  LV e' lateral:   8.38 cm/s LVOT diam:     1.80 cm  LV E/e' lateral: 10.2 LV SV:         54 LV SV Index:   33 LVOT Area:     2.54 cm  RIGHT VENTRICLE             IVC RV S prime:     18.40 cm/s  IVC diam: 1.20 cm TAPSE (M-mode): 3.0 cm LEFT ATRIUM             Index       RIGHT ATRIUM           Index LA diam:        3.80 cm 2.34 cm/m  RA Area:     12.80 cm LA Vol (A2C):   28.7 ml 17.64 ml/m RA Volume:   29.20 ml  17.94 ml/m LA Vol (A4C):   34.8 ml 21.39 ml/m LA Biplane Vol: 32.4 ml 19.91 ml/m  AORTIC VALVE AV Area (Vmax):    2.20 cm AV Area (Vmean):   2.19 cm AV Area (VTI):     2.32 cm AV Vmax:           121.50 cm/s AV Vmean:          81.900 cm/s AV VTI:            0.233 m AV Peak Grad:      5.9 mmHg AV Mean Grad:      3.0 mmHg LVOT Vmax:         105.00 cm/s LVOT Vmean:        70.400 cm/s LVOT VTI:          0.212 m LVOT/AV VTI ratio: 0.91  AORTA Ao Root diam: 2.50 cm Ao Asc diam:  2.90 cm MITRAL VALVE                TRICUSPID VALVE MV Area (PHT): 3.17 cm     TR Peak grad:   20.8 mmHg MV Decel Time: 239 msec     TR Vmax:        228.00 cm/s MV E velocity: 85.10 cm/s MV A velocity: 118.00 cm/s  SHUNTS MV E/A ratio:  0.72         Systemic VTI:  0.21 m                             Systemic Diam: 1.80 cm Rachelle Hora Croitoru MD Electronically signed by Thurmon Fair MD Signature Date/Time: 02/23/2021/4:12:19 PM    Final    CT HEAD CODE STROKE WO CONTRAST  Result Date: 02/23/2021 CLINICAL DATA:  Code stroke. Initial evaluation for  neuro deficit, stroke suspected. EXAM: CT HEAD WITHOUT CONTRAST TECHNIQUE: Contiguous axial images were obtained from the base of the skull through the vertex without intravenous contrast. COMPARISON:  Prior CT from 01/31/2020. FINDINGS: Brain: Streak/beam hardening artifact emanating from the left occipital calvarium noted, mildly limiting assessment. Cerebral volume within normal limits. Focal encephalomalacia  at the central and left superior cerebellum, consistent with a chronic left MCA distribution infarct. Subtle evolving hypodensity noted involving the superior right cerebellum and cerebellar vermis, concerning for an acute right SCA distribution infarct (series 3, image 12, 11). No associated hemorrhage or mass effect. No other evidence for acute large vessel territory infarct. No intracranial hemorrhage. No mass lesion, mass effect or midline shift. No hydrocephalus or extra-axial fluid collection. Vascular: No hyperdense vessel. Skull: Scalp soft tissues demonstrate no acute finding. Small focal exostosis noted at the right frontal calvarium. Calvarium intact. Sinuses/Orbits: Globes and orbital soft tissues within normal limits. Moderate left sphenoid and maxillary sinusitis. Additional scattered pneumatized secretions noted within the ethmoidal air cells. Subcentimeter osteoma noted within the right ethmoidal air cells as well. Mastoid air cells are clear. Other: None. ASPECTS [facility] Stroke Program Early CT Score) - Ganglionic level infarction (caudate, lentiform nuclei, internal capsule, insula, M1-M3 cortex): 7 - Supraganglionic infarction (M4-M6 cortex): 3 Total score (0-10 with 10 being normal): 10 IMPRESSION: 1. Evolving hypodensity involving the superior right cerebellum/cerebellar vermis, concerning for an acute right SCA distribution infarct. No intracranial hemorrhage or mass effect. 2. ASPECTS is 10. 3. Underlying chronic left SCA distribution infarcts. Critical Value/emergent results were  called by telephone at the time of interpretation on 02/23/2021 at 12:55 a.m. to provider Dr. Amada Jupiter, Who verbally acknowledged these results. Electronically Signed   By: Rise Mu M.D.   On: 02/23/2021 01:23   CT ANGIO HEAD NECK W WO CM (CODE STROKE)  Result Date: 02/23/2021 CLINICAL DATA:  Initial evaluation for neuro deficit, stroke suspected. EXAM: CT ANGIOGRAPHY HEAD AND NECK TECHNIQUE: Multidetector CT imaging of the head and neck was performed using the standard protocol during bolus administration of intravenous contrast. Multiplanar CT image reconstructions and MIPs were obtained to evaluate the vascular anatomy. Carotid stenosis measurements (when applicable) are obtained utilizing NASCET criteria, using the distal internal carotid diameter as the denominator. CONTRAST:  75mL OMNIPAQUE IOHEXOL 350 MG/ML SOLN COMPARISON:  Prior head CT from earlier the same day. FINDINGS: CTA NECK FINDINGS Aortic arch: Visualized aortic arch normal in caliber with normal branch pattern. No stenosis about the origin of the great vessels. Right carotid system: Right common carotid artery patent from its origin to the bifurcation without stenosis. Minimal plaque about the right carotid bulb without stenosis. Right ICA patent distally without stenosis, dissection or occlusion. Left carotid system: Left CCA patent from its origin to the bifurcation without stenosis. Minimal plaque about the left carotid bulb without significant stenosis. Left ICA patent distally without stenosis, dissection or occlusion. Vertebral arteries: Left vertebral artery arises directly from the aortic arch. Right vertebral artery dominant. Vertebral arteries widely patent within the neck. Just prior to the skull base/dural reflection, there is distal right V3 segment (series 9, image 83), suspicious for a possible short-segment dissection. Small amount of probable intraluminal thrombus protrudes into the vascular lumen at this level.  No frank raised dissection flap is seen. No more than mild stenosis at this level. The right vertebral artery is relatively normal just distally as it crosses into the cranial vault. Skeleton: No acute osseous finding. No discrete or worrisome osseous lesions. Mild cervical spondylosis noted at C5-6 without significant stenosis. Other neck: No other acute soft tissue abnormality within the neck. No mass or adenopathy. Upper chest: Visualized upper chest demonstrates no acute finding. Review of the MIP images confirms the above findings CTA HEAD FINDINGS Anterior circulation: Petrous, cavernous, and supraclinoid segments patent without stenosis or  other abnormality. A1 segments widely patent. Normal anterior communicating artery complex. Anterior cerebral arteries patent to their distal aspects without stenosis. No M1 stenosis or occlusion. Normal MCA bifurcations. Distal MCA branches well perfused and symmetric. Posterior circulation: Both V4 segments patent to the vertebrobasilar junction without stenosis. Both PICA origins patent and normal. Basilar somewhat diminutive but patent to its distal aspect without stenosis. Left superior cerebral artery patent. There is occlusion of the mid-distal right superior cerebellar artery, likely embolic (series 8, image 105). Left PCA supplied via the basilar as well as a prominent left posterior communicating artery. Predominant fetal type origin of the right PCA. Both PCAs remain widely patent to their distal aspects. Venous sinuses: Grossly patent allowing for timing the contrast bolus. Anatomic variants: Predominant fetal type origin of the right PCA. Review of the MIP images confirms the above findings IMPRESSION: 1. Focal intimal irregularity involving the distal right V3 segment, likely reflecting a short-segment dissection. No frank raised dissection flap or significant luminal narrowing. 2. Downstream occlusion of the mid-distal right superior cerebellar artery, likely  embolic. 3. Mild for age atheromatous disease elsewhere about the major arterial vasculature of the head and neck. No other hemodynamically significant or correctable stenosis. These results were communicated to Dr. Amada JupiterKirkpatrick at 1:22 amon 5/10/2022by text page via the [facility]. Electronically Signed   By: Rise MuBenjamin  McClintock M.D.   On: 02/23/2021 01:38    PHYSICAL EXAM  Temp:  [97.5 F (36.4 C)-98.8 F (37.1 C)] 97.5 F (36.4 C) (05/28 1921) Pulse Rate:  [88-89] 88 (05/28 1921) Resp:  [16-18] 18 (05/28 1921) BP: (116-119)/(67-77) 119/67 (05/28 1921) SpO2:  [99 %-100 %] 100 % (05/28 1921)  General - well nourished, well developed, in no apparent distress.    Ophthalmologic - fundi not visualized due to noncooperation.    Cardiovascular - regular rhythm and rate  Mental Status -  Level of arousal and orientation to time, place, and person were intact. Language including expression, naming, repetition, comprehension, reading, and writing was assessed and found intact Fund of Knowledge was assessed and was intact.  Cranial Nerves II - XII - II - Vision intact OU. III, IV, VI - Extraocular movements intact, but with subjective binocular diplopia bilateral gaze V - Facial sensation intact bilaterally. VII - Facial movement intact bilaterally. VIII - few beats of gaze evoked nystagmus on bilateral gaze, L>R X - Palate elevates symmetrically. XI - Chin turning & shoulder shrug intact bilaterally. XII - Tongue protrusion intact.  Motor Strength - The patient's strength was normal in all extremities and pronator drift was absent.   Motor Tone & Bulk - Muscle tone was assessed at the neck and appendages and was normal.  Bulk was normal and fasciculations were absent.   Reflexes - The patient's reflexes were normal in all extremities and she had no pathological reflexes.  Sensory - Light touch, temperature/pinprick were assessed and were normal.     Coordination - The patient had normal movements in the left hand and feet with no ataxia or dysmetria.    Slight right finger-to-nose dysmetria .  Tremor was absent.  Gait and Station - deferred   ASSESSMENT/PLAN Ms. Judy BoopShilpa Gutierrez is a 60 y.o. female with history of recurrent stroke, hypertension admitted on 02/26/2021 for rehab due to right cerebellar infarct. She has been working with therapist pretty well resolved vertigo or N/V. however, developed acute onset vertigo and N/V on 03/01/21. Stroke team was called back for evaluation.   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    MRI confirmed right superior cerebellum moderate infarct and punctate dorsal midbrain infarct.    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 Edema from of right cerebellar hemisphere infarcts results in leftward midline shift with mass effect on the fourth ventricle, mid brain and pons without evidence of hydrocephalus.  CT head 03/04/21 stable right PICA and SCA infarcts, no hydrocephalus.  CT head 03/06/21 progression of right SCA hemorrhagic transformation.  Large right PICA infarct edema mildly progressed.  CT head 03/10/21: Large right PICA infarct with increased acute hemorrhagic  transformation and new IVH into the 4th ventricle. Increased edema in the superior right cerebellum surrounding the hemorrhage.Progressive ventriculomegaly, compatible with developing obstructive hydrocephalus.  CT repeat on Monday  Dr. [name] recommend outpt cerebral angiogram soon  Loop recorder interrogation negative.    LDL 51  A1c 6.0.   echo 02/23/21 EF 60 to 65%.  No cardiac source of embolism.Marland Kitchen    Hypercoagulable work-up repeated again negative.    Was on aspirin 81 and the Brilinta 90 twice daily for 4 weeks and then Brilinta alone. Given hemorrhagic conversion, brilinta discontinued, continued on ASA 81. With new infarct and possible dissection, added plavix 5/19 after CT stable but stopped both after serial follow-up CT showed worsening of hemorrhage. If CT repeat on Monday stable, will re-start ASA 81  Ongoing aggressive stroke risk factor management  Therapy recommendations:  CIR  Hx of stroke  left cerebellar stroke in 01/2020 status post tPA admitted to [facility].  At that time, CTA head and neck no LVO or stenosis, MRI showed left superior cerebellar infarct.  EF normal range.  TCD bubble study negative.  LDL 138, A1c 6.0.  Hypercoagulable work-up negative.  Bilateral lower extremity DVT negative.  Discharged with DAPT for 3 weeks and then aspirin alone, as well as Lipitor 40.  After discharge she follow-up with Dr. [name] at [facility], had loop recorder placed in 02/2020.  Hypertension  Stable  Resumed amlodipine 5mg   BP goal < 160 given hemorrhagic conversion  Long-term BP goal normotensive  Hyperlipidemia  Home meds:  Lipitor 40mg   LDL 51, at goal < 70  Continue lipitor 40  Continue statin at discharge  Other Stroke Risk Factors    Other Active Problems    Hospital day # 15  [name], MD PhD Stroke Neurology 03/13/2021 8:12 PM  To contact Stroke Continuity provider, please refer to [email or web address]. After hours, contact General Neurology

## 2021-03-13 NOTE — Progress Notes (Signed)
Physical Therapy Session Note  Patient Details  Name: Judy Gutierrez MRN: [id number] Date of Birth: 07/28/1961  Today's Date: 03/13/2021 PT Individual Time: 1136-1220 PT Individual Time Calculation (min): 44 min   Short Term Goals: Week 2:  PT Short Term Goal 1 (Week 2): STG = LTG due to ELOS  Skilled Therapeutic Interventions/Progress Updates:     Pt received supine in bed and agrees to therapy. No complaint of pain. Supine to sit with verbal cues on positioning at EOB. Pt performs sit to stand with CGA and stand step transfer to [facility] with minA. Pt self propels WC to gym with minA and consistent cueing to increase use of R upper extremity due to pt veering to the R. Sit to stand multiple reps with CGA. Pt performs x12 steps with RHR and minA. Verbal cues for step placement and safe positioning due to pt attempting to step up onto step from unsafe distance. Pt performs block practice of transitioning from WC to standing up and holding onto [facility] and ascending/descending 4 steps. Pt occasionally attempts to cross L foot over R and requires increased verbal cueing for safety. Following stair training, pt performs gait training without AD for work on R lower extremity coordination and balance.  Pt ambulates 2x70' with modA, with manual facilitation of lateral weight shifting and consistent verbal cues for progression of R leg. Seated rest break in between bouts. WC transport back to room. Stand pivot transfer to recliner with minA. Left with husband present and all needs within reach.  Therapy Documentation Precautions:  Precautions Precautions: Fall Precaution Comments: Diplopia, trunk/RUE/LE ataxic. Systolic BP <160 Restrictions Weight Bearing Restrictions: No   Therapy/Group: Individual Therapy  Beau Fanny, PT, DPT 03/13/2021, 3:43 PM

## 2021-03-13 NOTE — Progress Notes (Signed)
Occupational Therapy Session Note  Patient Details  Name: Judy Gutierrez MRN: [id number] Date of Birth: December 26, 1960  Today's Date: 03/13/2021 OT Individual Time: 1418-1530 OT Individual Time Calculation (min): 72 min    Short Term Goals: Week 2:  OT Short Term Goal 2 (Week 2): STGs= LTGs  Skilled Therapeutic Interventions/Progress Updates:    Pt in bed to start session with transfer to sitting at supervision level to work on donning her shoes.  She was able to donn the left with supervision but needed min assist to donn the right, getting the heel all the way in.  She completed stand pivot transfer with min assist to the wheelchair and then onto the mat for quadriped position.  Worked on RUE weightbearing with modified pushups for 2 sets of 10 reps and for sustained place and hold with the LUE on the wall while stabilizing on the mat with the RUE.  She needed rest breaks for 1-2 mins between activities.  Min guard assistance for all activities with min assist when completing bird dog position.  Next, had her complete transitions stand to squat for picking up and placing clothespins with the RUE with min assist for balance.  Increased ataxia noted in trunk pelvis and RUE.  Decreased ability to complete tip to tip pinch for picking them up and placing them on the horizontal bars.  Finished with removal of them in sitting before returning to the wheelchair and back to the room at min assist.  She was left in supine with the call button and phone in reach and safety alarm in place.      Therapy Documentation Precautions:  Precautions Precautions: Fall Precaution Comments: Diplopia, trunk/RUE/LE ataxic. Systolic BP <160 Restrictions Weight Bearing Restrictions: No  Pain: Pain Assessment Pain Scale: Faces Pain Score: 0-No pain ADL: See Care Tool Section for some details of mobility and selfcare  Therapy/Group: Individual Therapy  Rayan Ines OTR/L 03/13/2021, 4:25 PM

## 2021-03-13 NOTE — Progress Notes (Signed)
Physical Therapy Session Note  Patient Details  Name: Judy Gutierrez MRN: [id number] Date of Birth: 1961/05/29  Today's Date: 03/13/2021 PT Individual Time: 4163-8453 PT Individual Time Calculation (min): 60 min   Short Term Goals: Week 2:  PT Short Term Goal 1 (Week 2): STG = LTG due to ELOS  Skilled Therapeutic Interventions/Progress Updates:   Pt received supine in bed and agreeable to PT. Supine>sit transfer with supevision assist and cues for awareness of RUE. Pt able to don socks with superivoisn assist. Min assist to don shoes sitting EOB.  Stand pivot tranfers x 5 throughout treatment with RW and CGA-supervision ASSIST WITH cues for wide step length on the RLE to prevent lateral LOB. Sit<>stnad performed with supervision assist throughout session with and without UE support and RW.   VS assessment.  Sitting 115/74. HR 99 Standing 0 min 99/75, HR 115.  stadining 2 min 101/75, HR 112 standing s/p gait 101/75, HR 111.   Gait training with RW in rehab gym with CGA-min assist x 186f and cues fotr safety in turns and wide step due to mild ataxia on the RLE cuasing narrow BOS. dyanmic gait training to step over 1inch threshold on floor 2 x 4 with min assist and min-mod cues for AD management and improved sequencing to reduce step length on RLe AND reduce flal risk.   Nustep reciproal acitivty tolerance training x 8 minutes with cues for attention to the RUE and symmetry in ROM in BLE intermittently.   BITS visual scanning x 2 min with the RUE and sequencing for alphabet. Min assist for balance in stance and cues for coordination and decreased trunkal compensation with reach.   WC mobility with BUE x 1096fto force use of the RUE. Pt returned to room and performed stand pivot transfer to bed with supervision assist and RW. Sit>supine completed without assist, and left supine in bed with call bell in reach and all needs met.          Therapy Documentation Precautions:   Precautions Precautions: Fall Precaution Comments: Diplopia, trunk/RUE/LE ataxic. Systolic BP <1<646estrictions Weight Bearing Restrictions: No    Vital Signs: Therapy Vitals Temp: 98.8 F (37.1 C) Temp Source: Oral Pulse Rate: 89 Resp: 16 BP: 118/77 Patient Position (if appropriate): Sitting Oxygen Therapy SpO2: 100 % O2 Device: Room Air Pain: denies   Therapy/Group: Individual Therapy  AuLorie Phenix/28/2022, 3:28 PM

## 2021-03-14 NOTE — Progress Notes (Signed)
PROGRESS NOTE   Subjective/Complaints: Pt rested well. Feels that vision is improving.   ROS: Patient denies fever, rash, sore throat, blurred vision, nausea, vomiting, diarrhea, cough, shortness of breath or chest pain, joint or back pain, headache, or mood change.    Objective:   No results found. No results for input(s): WBC, HGB, HCT, PLT in the last 72 hours. No results for input(s): NA, K, CL, CO2, GLUCOSE, BUN, CREATININE, CALCIUM in the last 72 hours.  Intake/Output Summary (Last 24 hours) at 03/14/2021 0732 Last data filed at 03/13/2021 1837 Gross per 24 hour  Intake 100 ml  Output --  Net 100 ml        Physical Exam: Vital Signs Blood pressure 132/86, pulse 82, temperature 98 F (36.7 C), temperature source Oral, resp. rate 16, height 5\' 2"  (1.575 m), weight 58.1 kg, last menstrual period 12/08/2010, SpO2 100 %.  Constitutional: No distress . Vital signs reviewed. HEENT: EOMI, oral membranes moist Neck: supple Cardiovascular: RRR without murmur. No JVD    Respiratory/Chest: CTA Bilaterally without wheezes or rales. Normal effort    GI/Abdomen: BS +, non-tender, non-distended Ext: no clubbing, cyanosis, or edema Psych: pleasant and cooperative Musc: No edema in extremities.  No tenderness in extremities. Neuro: Alert, diplopia Motor: 5/5 throughout RUE dysmetria and ataxia, stable to improved LUE dysmetria improved Dysarthria improving. Normal language and functional cognition  Assessment/Plan: 1. Functional deficits which require 3+ hours per day of interdisciplinary therapy in a comprehensive inpatient rehab setting.  Physiatrist is providing close team supervision and 24 hour management of active medical problems listed below.  Physiatrist and rehab team continue to assess barriers to discharge/monitor patient progress toward functional and medical goals  Care Tool:  Bathing    Body parts bathed  by patient: Right arm,Right lower leg,Left arm,Chest,Abdomen,Front perineal area,Buttocks,Left upper leg,Left lower leg,Face,Right upper leg         Bathing assist Assist Level: Supervision/Verbal cueing     Upper Body Dressing/Undressing Upper body dressing   What is the patient wearing?: Pull over shirt,Bra    Upper body assist Assist Level: Minimal Assistance - Patient > 75% (for hooking bra)    Lower Body Dressing/Undressing Lower body dressing      What is the patient wearing?: Underwear/pull up,Pants     Lower body assist Assist for lower body dressing: Supervision/Verbal cueing     Toileting Toileting    Toileting assist Assist for toileting: Contact Guard/Touching assist     Transfers Chair/bed transfer  Transfers assist     Chair/bed transfer assist level: Minimal Assistance - Patient > 75%     Locomotion Ambulation   Ambulation assist      Assist level: Minimal Assistance - Patient > 75% Assistive device: Walker-rolling Max distance: 150   Walk 10 feet activity   Assist     Assist level: Minimal Assistance - Patient > 75% Assistive device: Walker-rolling   Walk 50 feet activity   Assist    Assist level: Minimal Assistance - Patient > 75% Assistive device: Walker-rolling    Walk 150 feet activity   Assist    Assist level: Minimal Assistance - Patient > 75% Assistive device:  Walker-rolling    Walk 10 feet on uneven surface  activity   Assist Walk 10 feet on uneven surfaces activity did not occur: Safety/medical concerns   Assist level: Minimal Assistance - Patient > 75% Assistive device: Photographer Will patient use wheelchair at discharge?: Yes Type of Wheelchair: Manual    Wheelchair assist level: Contact Guard/Touching assist Max wheelchair distance: 200    Wheelchair 50 feet with 2 turns activity    Assist        Assist Level: Contact Guard/Touching assist    Wheelchair 150 feet activity     Assist      Assist Level: Contact Guard/Touching assist    Medical Problem List and Plan: 1.  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 Increased edema associated with the right cerebellar and right dorsal midbrain infarcts with interval development of acute hemorrhage at the site of right cerebellar infarct. Increased resulting mass effect on the fourth ventricle without hydrocephalus.      2. Paranasal sinus mucosal thickening with frothy secretions.  Repeat CT on 5/17 showing new right PICA infarct, repeat on 5/19 showing stability, repeat CT on 5/21 shows worsening/progression of cytotoxic edema from right PICA infarct, hemorrhagic transformation of right SCA infarct, and hydrocephalus, repeat CT later on 5/20 stable.   Repeat head CT on 5/25 showing large right PICA territory infarct with increased hemorrhagic transformation and new intraventricular extension of hemorrhage into fourth ventricle with increasing edema.  Also progressive ventriculomegaly with developing obstructive hydrocephalus.  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  Leftward midline shift with mass-effect on fourth ventricle, but no hydrocephalus  Appreciate Neuro recs, discussed with Neuro - following closely  May require emergent ventriculostomy versus decompression if decompensated.   5/29--appears stable to improved. Neuro following   -CT of head tomorrow 2.  Antithrombotics: -DVT/anticoagulation: Lovenox, d/ced d/t bleed              -antiplatelet therapy: Aspirin 81 mg  daily d/ced as well due to increasing hemorrhage, Brilinta d/ced, Plavix initiated, but also DC'd on 5/21 due to progression of hemorrhage 3. Pain Management: Continue Tylenol as needed             Monitor, particularly for headaches with increased exertion  Controlled on 5/29 4. Mood: Provide emotional support             -antipsychotic agents: N/A 5. Neuropsych: This patient is capable of making decisions on her own behalf. 6. Skin/Wound Care: Routine skin checks 7. Fluids/Electrolytes/Nutrition: Routine in and outs 8.  Hypertension.  Patient on Cozaar50 mg daily, Norvasc 5 mg daily prior to admission.    Norvasc 5 started on 5/16, continue             Vitals with BMI 03/14/2021 03/13/2021 03/13/2021  Height - - -  Weight - - -  BMI - - -  Systolic 132 119 701  Diastolic 86 67 77  Pulse 82 88 89   Controlled on 5/29 9. Hyperlipidemia: Lipitor 10. ABLA: Resolved  Hb 11.9 on 5/26, labs ordered for Monday  Cont to monitor 12. Transaminitis: Resolved  Cont to monitor 13. Dysarthria  Continue SLP 14.  Hyponatremia: Resolved 15.  Tachycardia  ECG reviewed, no significant change in ECG from prior  Improved  LOS: 16 days A FACE TO FACE EVALUATION WAS PERFORMED  Ranelle Oyster 03/14/2021, 7:32 AM

## 2021-03-15 ENCOUNTER — Inpatient Hospital Stay (HOSPITAL_COMMUNITY): Payer: No Typology Code available for payment source

## 2021-03-15 LAB — CBC WITH DIFFERENTIAL/PLATELET
Abs Immature Granulocytes: 0.01 10*3/uL (ref 0.00–0.07)
Basophils Absolute: 0 10*3/uL (ref 0.0–0.1)
Basophils Relative: 0 %
Eosinophils Absolute: 0.1 10*3/uL (ref 0.0–0.5)
Eosinophils Relative: 2 %
HCT: 36.7 % (ref 36.0–46.0)
Hemoglobin: 12.4 g/dL (ref 12.0–15.0)
Immature Granulocytes: 0 %
Lymphocytes Relative: 39 %
Lymphs Abs: 2.1 10*3/uL (ref 0.7–4.0)
MCH: 29.5 pg (ref 26.0–34.0)
MCHC: 33.8 g/dL (ref 30.0–36.0)
MCV: 87.4 fL (ref 80.0–100.0)
Monocytes Absolute: 0.4 10*3/uL (ref 0.1–1.0)
Monocytes Relative: 7 %
Neutro Abs: 2.8 10*3/uL (ref 1.7–7.7)
Neutrophils Relative %: 52 %
Platelets: 277 10*3/uL (ref 150–400)
RBC: 4.2 MIL/uL (ref 3.87–5.11)
RDW: 12.7 % (ref 11.5–15.5)
WBC: 5.4 10*3/uL (ref 4.0–10.5)
nRBC: 0 % (ref 0.0–0.2)

## 2021-03-15 LAB — CUP PACEART REMOTE DEVICE CHECK
Date Time Interrogation Session: 20220527230253
Implantable Pulse Generator Implant Date: 20210520

## 2021-03-15 MED ORDER — ASPIRIN EC 81 MG PO TBEC
81.0000 mg | DELAYED_RELEASE_TABLET | Freq: Every day | ORAL | Status: DC
  Administered 2021-03-15 – 2021-03-16 (×2): 81 mg via ORAL
  Filled 2021-03-15 (×2): qty 1

## 2021-03-15 NOTE — Progress Notes (Signed)
PROGRESS NOTE   Subjective/Complaints:  Pt reports "fine" ready for d/c tomorrow- denies pain.  Bowels and bladder working well.   ROS: Pt denies SOB, abd pain, CP, N/V/C/D, and vision changes   Objective:   CT HEAD WO CONTRAST  Result Date: 03/15/2021 CLINICAL DATA:  Stroke follow-up EXAM: CT HEAD WITHOUT CONTRAST TECHNIQUE: Contiguous axial images were obtained from the base of the skull through the vertex without intravenous contrast. COMPARISON:  Three days ago FINDINGS: Brain: Hemorrhagic infarct in the right cerebellum shows decreased swelling and cerebellar distortion. Continued fourth ventricular distortion but stable unremarkable lateral ventricular volume. No evidence of acute hemorrhage or new infarct. 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 No hydrocephalus. Electronically Signed   By: Marnee Spring M.D.   On: 03/15/2021 05:08   Recent Labs    03/15/21 0751  WBC 5.4  HGB 12.4  HCT 36.7  PLT 277   No results for input(s): NA, K, CL, CO2, GLUCOSE, BUN, CREATININE, CALCIUM in the last 72 hours.  Intake/Output Summary (Last 24 hours) at 03/15/2021 0951 Last data filed at 03/15/2021 0715 Gross per 24 hour  Intake 436 ml  Output --  Net 436 ml        Physical Exam: Vital Signs Blood pressure 112/61, pulse 89, temperature 99.1 F (37.3 C), resp. rate 18, height 5\' 2"  (1.575 m), weight 58.1 kg, last menstrual period 12/08/2010, SpO2 100 %.     General: awake, alert, appropriate, sitting up in bed;  NAD HENT: conjugate gaze; oropharynx moist CV: regular rate; no JVD Pulmonary: CTA B/L; no W/R/R- good air movement GI: soft, NT, ND, (+)BS Psychiatric: appropriate; interactive Neurological: alert; R eye covered with tape in EGs/diplopia noted.  Ext: no clubbing, cyanosis, or edema Musc: No edema in  extremities.  No tenderness in extremities. Motor: 5/5 throughout RUE dysmetria and ataxia, stable to improved LUE dysmetria improved Dysarthria improving. Normal language and functional cognition  Assessment/Plan: 1. Functional deficits which require 3+ hours per day of interdisciplinary therapy in a comprehensive inpatient rehab setting.  Physiatrist is providing close team supervision and 24 hour management of active medical problems listed below.  Physiatrist and rehab team continue to assess barriers to discharge/monitor patient progress toward functional and medical goals  Care Tool:  Bathing    Body parts bathed by patient: Right arm,Right lower leg,Left arm,Chest,Abdomen,Front perineal area,Buttocks,Left upper leg,Left lower leg,Face,Right upper leg         Bathing assist Assist Level: Supervision/Verbal cueing     Upper Body Dressing/Undressing Upper body dressing   What is the patient wearing?: Pull over shirt,Bra    Upper body assist Assist Level: Minimal Assistance - Patient > 75% (for hooking bra)    Lower Body Dressing/Undressing Lower body dressing      What is the patient wearing?: Underwear/pull up,Pants     Lower body assist Assist for lower body dressing: Supervision/Verbal cueing     Toileting Toileting    Toileting assist Assist for toileting: Contact Guard/Touching assist     Transfers Chair/bed transfer  Transfers assist     Chair/bed transfer assist level: Minimal Assistance -  Patient > 75%     Locomotion Ambulation   Ambulation assist      Assist level: Minimal Assistance - Patient > 75% Assistive device: Walker-rolling Max distance: 150   Walk 10 feet activity   Assist     Assist level: Minimal Assistance - Patient > 75% Assistive device: Walker-rolling   Walk 50 feet activity   Assist    Assist level: Minimal Assistance - Patient > 75% Assistive device: Walker-rolling    Walk 150 feet activity   Assist     Assist level: Minimal Assistance - Patient > 75% Assistive device: Walker-rolling    Walk 10 feet on uneven surface  activity   Assist Walk 10 feet on uneven surfaces activity did not occur: Safety/medical concerns   Assist level: Minimal Assistance - Patient > 75% Assistive device: Photographer Will patient use wheelchair at discharge?: Yes Type of Wheelchair: Manual    Wheelchair assist level: Contact Guard/Touching assist Max wheelchair distance: 200    Wheelchair 50 feet with 2 turns activity    Assist        Assist Level: Contact Guard/Touching assist   Wheelchair 150 feet activity     Assist      Assist Level: Contact Guard/Touching assist    Medical Problem List and Plan: 1.  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 Increased edema associated with the right cerebellar and right dorsal midbrain infarcts with interval development of acute hemorrhage at the site of right cerebellar infarct. Increased resulting mass effect on the fourth ventricle without hydrocephalus.      2. Paranasal sinus mucosal thickening with frothy secretions.  Repeat CT on 5/17 showing new right PICA infarct, repeat on 5/19 showing stability, repeat CT on 5/21 shows worsening/progression of cytotoxic edema from right PICA infarct, hemorrhagic transformation of right SCA infarct, and hydrocephalus, repeat CT later on 5/20 stable.   Repeat head CT on 5/25 showing large right PICA territory infarct with increased hemorrhagic transformation and new intraventricular extension of hemorrhage into fourth ventricle with increasing edema.  Also progressive ventriculomegaly with developing obstructive hydrocephalus.  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  Leftward midline shift with mass-effect on fourth ventricle, but no hydrocephalus  Appreciate Neuro recs, discussed with Neuro - following closely  May require emergent ventriculostomy versus decompression if decompensated.   5/29--appears stable to improved. Neuro following   -CT of head tomorrow  5/30- CT looking better- less swelling, no hydrocephalus. -con't PT and OT and SLP- d/c tomorrow 2.  Antithrombotics: -DVT/anticoagulation: Lovenox, d/ced d/t bleed              -antiplatelet therapy: Aspirin 81 mg daily d/ced as well due to increasing hemorrhage, Brilinta d/ced, Plavix initiated, but also DC'd on 5/21 due to progression of hemorrhage 3. Pain Management: Continue Tylenol as needed             Monitor, particularly for headaches with increased exertion  5/30- no pain- con't tylenol prn 4. Mood: Provide emotional support             -  antipsychotic agents: N/A 5. Neuropsych: This patient is capable of making decisions on her own behalf. 6. Skin/Wound Care: Routine skin checks 7. Fluids/Electrolytes/Nutrition: Routine in and outs 8.  Hypertension.  Patient on Cozaar50 mg daily, Norvasc 5 mg daily prior to admission.    Norvasc 5 started on 5/16, continue             Vitals with BMI 03/15/2021 03/14/2021 03/14/2021  Height - - -  Weight - - -  BMI - - -  Systolic 112 115 102  Diastolic 61 72 86  Pulse 89 87 82   5/30- BP controlled- con't regimen 9. Hyperlipidemia: Lipitor 10. ABLA: Resolved             Hb 11.9 on 5/26, labs ordered for Monday  5/30- Hb 12.4- improving- con't to monitor  Cont to monitor 12. Transaminitis: Resolved  Cont to monitor 13. Dysarthria  Continue SLP 14.  Hyponatremia: Resolved 15.  Tachycardia  ECG reviewed, no significant change in ECG from  prior  Improved  LOS: 17 days A FACE TO FACE EVALUATION WAS PERFORMED  Audine Mangione 03/15/2021, 9:51 AM

## 2021-03-15 NOTE — Discharge Instructions (Signed)
Inpatient Rehab Discharge Instructions  Judy Gutierrez Discharge date and time: 03/16/21    Activities/Precautions/ Functional Status: Activity: As tolerated Diet: Vegetarian Wound Care: Routine skin checks   Functional status:  ___ No restrictions     ___ Walk up steps independently _X__ 24/7 supervision/assistance   ___ Walk up steps with assistance ___ Intermittent supervision/assistance  ___ Bathe/dress independently ___ Walk with walker     _ X__ Bathe/dress with assistance ___ Walk Independently    ___ Shower independently ___ Walk with assistance    ___ Shower with assistance _X__ No alcohol     ___ Return to work/school ________  Special Instructions: 1. No driving or strenuous activity.  2. Dr. [name] to determine timing of outpatient cerebral angiogram.  3. Do not use any additional aspirin, ibuprofen, aleve, motrin--can only use tylenol for pain as needed.     COMMUNITY REFERRALS UPON DISCHARGE:    Home Health:   PT & OT                  Agency:[facility] Phone:[phone]  Medical Equipment/Items Ordered: wheelchair (husband to purchase)                                                 Agency/Supplier: N/A  GENERAL COMMUNITY RESOURCES FOR PATIENT/FAMILY: Support Groups:[facility]PPORT [facility] STROKE SUPPORT GROUP MEETS SECOND Thursday OF EVERY MONTH IN THE EVENING SEE WEBSITE FOR MEETING TIMES AND LOCATIONS   My questions have been answered and I understand these instructions. I will adhere to these goals and the provided educational materials after my discharge from the hospital.  Patient/Caregiver Signature _______________________________ Date __________  Clinician Signature _______________________________________ Date __________  Please bring this form and your medication list with you to all your follow-up doctor's appointments.       STROKE/TIA DISCHARGE INSTRUCTIONS SMOKING Cigarette smoking nearly doubles your risk of having a  stroke & is the single most alterable risk factor  If you smoke or have smoked in the last 12 months, you are advised to quit smoking for your health.  Most of the excess cardiovascular risk related to smoking disappears within a year of stopping.  Ask you doctor about anti-smoking medications  [facility] Quit Line: 1-800-QUIT NOW  Free Smoking Cessation Classes [phone]  CHOLESTEROL Know your levels; limit fat & cholesterol in your diet  Lipid Panel     Component Value Date/Time   CHOL 105 02/23/2021 0745   CHOL 191 09/09/2015 0804   TRIG 28 02/23/2021 0745   HDL 48 02/23/2021 0745   HDL 60 09/09/2015 0804   CHOLHDL 2.2 02/23/2021 0745   VLDL 6 02/23/2021 0745   LDLCALC 51 02/23/2021 0745   LDLCALC 120 (H) 09/09/2015 0804      Many patients benefit from treatment even if their cholesterol is at goal.  Goal: Total Cholesterol (CHOL) less than 160  Goal:  Triglycerides (TRIG) less than 150  Goal:  HDL greater than 40  Goal:  LDL (LDLCALC) less than 100   BLOOD PRESSURE American Stroke Association blood pressure target is less that 120/80 mm/Hg  Your discharge blood pressure is:  BP: 127/81  Monitor your blood pressure  Limit your salt and alcohol intake  Many individuals will require more than one medication for high blood pressure  DIABETES (A1c is a blood sugar average for  last 3 months) Goal HGBA1c is under 7% (HBGA1c is blood sugar average for last 3 months)  Diabetes: No known diagnosis of diabetes    Lab Results  Component Value Date   HGBA1C 6.0 (H) 02/23/2021     Your HGBA1c can be lowered with medications, healthy diet, and exercise.  Check your blood sugar as directed by your physician  Call your physician if you experience unexplained or low blood sugars.  PHYSICAL ACTIVITY/REHABILITATION Goal is 30 minutes at least 4 days per week  Activity: Increase activity slowly, Therapies: Physical Therapy: Home Health Return to work:   Activity decreases your  risk of heart attack and stroke and makes your heart stronger.  It helps control your weight and blood pressure; helps you relax and can improve your mood.  Participate in a regular exercise program.  Talk with your doctor about the best form of exercise for you (dancing, walking, swimming, cycling).  DIET/WEIGHT Goal is to maintain a healthy weight  Your discharge diet is:  Diet Order            Diet vegetarian Room service appropriate? Yes; Fluid consistency: Thin  Diet effective now                 liquids Your height is:  Height: 5\' 2"  (157.5 cm) Your current weight is: Weight: 58.1 kg Your Body Mass Index (BMI) is:  BMI (Calculated): 23.42  Following the type of diet specifically designed for you will help prevent another stroke.  You are at goal weight.    Your goal Body Mass Index (BMI) is 19-24.  Healthy food habits can help reduce 3 risk factors for stroke:  High cholesterol, hypertension, and excess weight.  RESOURCES Stroke/Support Group:  Call [phone]   STROKE EDUCATION PROVIDED/REVIEWED AND GIVEN TO PATIENT Stroke warning signs and symptoms How to activate emergency medical system (call 911). Medications prescribed at discharge. Need for follow-up after discharge. Personal risk factors for stroke. Pneumonia vaccine given:  Flu vaccine given:  My questions have been answered, the writing is legible, and I understand these instructions.  I will adhere to these goals & educational materials that have been provided to me after my discharge from the hospital.

## 2021-03-15 NOTE — Progress Notes (Signed)
Inpatient Rehabilitation Care Coordinator Discharge Note  The overall goal for the admission was met for:   Discharge location: [place] TO ASSIST WITH 24/7 SUPERVISION  Length of Stay: Yes-18 DAYS  Discharge activity level: Yes-SUPERVISION LEVEL  Home/community participation: Yes  Services provided included: MD, RD, PT, OT, SLP, RN, CM, Pharmacy, Neuropsych and SW  Financial Services: Private Insurance: [place] offered to/list presented to:PT AND HUSBAND  Follow-up services arranged: Home Health: [facility] and Patient/Family has no preference for HH/DME agencies HUSBAND TO GET WHEELCHAIR ON OWN  Comments (or additional information):HUSAND WAS HERE FOR EDUCATION AND AWARE OF THE RECOMMENDATION OF 24/7 SUPERVISION   Patient/Family verbalized understanding of follow-up arrangements: Yes  Individual responsible for coordination of the follow-up plan: AJAY-HUSBAND [phone]  Confirmed correct DME delivered: Elease Hashimoto 03/15/2021    Nolyn Swab, [place] Rhyme

## 2021-03-15 NOTE — Progress Notes (Signed)
Speech Language Pathology Discharge Summary  Patient Details  Name: Judy Gutierrez MRN: [id number] Date of Birth: 1961-10-09  Today's Date: 03/15/2021 SLP Individual Time: 1010-1051 SLP Individual Time Calculation (min): 41 min   Skilled Therapeutic Interventions:  Skilled SLP intervention focused on dc education. Pt educated on speech intelligibility strategies during conversation task. SLP required clarification x2 and pt demonstrated use of over articulation to increase intelligiblity. She completed mental calculations with mod I and verbal cue x 1 for error awareness. Dc from skilled SLP intervention.    Patient has met 2 of 2 long term goals.  Patient to discharge at overall Modified Independent level.  Reasons goals not met:   all ltg's met.  Clinical Impression/Discharge Summary:   Pt has met 2/2 long term goals this reporting period and demonstrates improvement in speech intellibility and problem solving skills. She is 85%intelligible at conversational level. She demonstrates awareness of decreased speech clarity and understanding and use of strategies to increase speech intelligibility. She is mod I for complex problem solving tasks due to decreased awareness of errors and cues needed for self monitoring. Pt and family education completed. She will Dc home with 24 hour supervision from family. No follow up SLP tx recommended at this time.   Care Partner:  Caregiver Able to Provide Assistance: Yes  Type of Caregiver Assistance: Physical;Cognitive  Recommendation:  None      Equipment: none recommended at this time   Reasons for discharge: Treatment goals met   Patient/Family Agrees with Progress Made and Goals Achieved: Yes    Judy Gutierrez 03/15/2021, 10:54 AM

## 2021-03-15 NOTE — Progress Notes (Signed)
Occupational Therapy Discharge Summary  Patient Details  Name: Judy Gutierrez MRN: [id number] Date of Birth: 1961-03-07  Today's Date: 03/15/2021 OT Individual Time: 1430-1530 OT Individual Time Calculation (min): 60 min    Patient has met 4 of 5 long term goals due to improved activity tolerance, improved balance, ability to compensate for deficits and functional use of  RIGHT upper extremity.  Patient to discharge at overall Supervision level for safety at home.    Reasons goals not met: Pt continues to exhibit ataxic movements and decreased standing balance which contribute to decreased independence in BADLs and functional mobility overall requiring close spvsn or CGA at times. Pt's decreased coordination in [place] also contribute to [facility] required in BADLs which have contributed to not meeting all LTGs and the need for occasional assistance.   Recommendation:  Patient will benefit from ongoing skilled OT services in outpatient setting to continue to advance functional skills in the area of BADL, iADL and Reduce care partner burden. Intermittent supervision recommended for safe d/c with mobility to home.  Equipment: No equipment provided. Pt has recommended DME at home prior to hospitalization.  Reasons for discharge: discharge from hospital  Patient/family agrees with progress made and goals achieved: Yes  OT Discharge Precautions/Restrictions  Precautions Precautions: Fall Precaution Comments: Diplopia, trunk/RUE/LE ataxic. Restrictions Weight Bearing Restrictions: No Vital Signs Therapy Vitals Temp: 97.7 F (36.5 C) Temp Source: Oral Pulse Rate: 92 Resp: 16 BP: 116/62 Patient Position (if appropriate): Lying Oxygen Therapy SpO2: 100 % O2 Device: Room Air Pain Pain Assessment Pain Scale: 0-10 Pain Score: 0-No pain ADL ADL Eating: Supervision/safety Where Assessed-Eating: Edge of bed Grooming: Setup,Supervision/safety Where Assessed-Grooming: Standing at sink,Sitting  at sink Upper Body Bathing: Supervision/safety Where Assessed-Upper Body Bathing: Shower Lower Body Bathing: Supervision/safety (with grab bars) Where Assessed-Lower Body Bathing: Shower Upper Body Dressing: Setup Where Assessed-Upper Body Dressing: Edge of bed,Chair Lower Body Dressing: Supervision/safety Where Assessed-Lower Body Dressing: Chair,Other (Comment) (seated and in stance with RW) Toileting: Supervision/safety Where Assessed-Toileting: Glass blower/designer: Therapist, music Method: Counselling psychologist: Grab bars (RW) Walk-In Banker: Curator Method: Heritage manager: Network engineer bench,Other (comment) (RW) ADL Comments: Overall pt is at CGA-close spvsn level due to ataxia and decreased balance. Vision Baseline Vision/History: Wears glasses Wears Glasses: At all times Patient Visual Report: Diplopia Additional Comments: Pt's diplopia continues at d/c; pt reports ability to see single image in extreme overhead position Perception  Perception: Within Functional Limits Praxis Praxis: Intact Cognition Overall Cognitive Status: Within Functional Limits for tasks assessed Arousal/Alertness: Awake/alert Orientation Level: Oriented X4 Attention: Selective;Divided Selective Attention: Appears intact Divided Attention: Appears intact Memory: Appears intact Awareness: Appears intact Problem Solving: Appears intact Executive Function: Sequencing;Self Monitoring Sequencing: Appears intact Self Monitoring: Appears intact Safety/Judgment: Appears intact Sensation Sensation Light Touch: Appears Intact Hot/Cold: Not tested Proprioception: Appears Intact Stereognosis: Appears Intact Coordination Gross Motor Movements are Fluid and Coordinated: No Fine Motor Movements are Fluid and Coordinated: No Coordination and Movement Description: ataxic trunk and RUE 9 Hole Peg Test: R: 1:23, L:  35 sec;        Bocks and Blocks: R: 18 blocks, L: 46 blocks (in 1 minute each side) Motor  Motor Motor: Ataxia Motor - Skilled Clinical Observations: trunk and RUE ataxia, very mild R hemi Motor - Discharge Observations: pt reported decreased ataxia since admission Mobility  Bed Mobility Bed Mobility: Supine to Sit;Sitting - Scoot to Edge of Bed;Sit to Supine Rolling Right: Independent  Rolling Left: Independent Right Sidelying to Sit: Supervision/Verbal cueing Left Sidelying to Sit: Supervision/Verbal cueing Supine to Sit: Supervision/Verbal cueing Sitting - Scoot to Edge of Bed: Supervision/Verbal cueing Sit to Supine: Supervision/Verbal cueing Transfers Sit to Stand: Contact Guard/Touching assist Stand to Sit: Contact Guard/Touching assist  Trunk/Postural Assessment  Cervical Assessment Cervical Assessment: Within Functional Limits Thoracic Assessment Thoracic Assessment: Within Functional Limits Lumbar Assessment Lumbar Assessment: Within Functional Limits Postural Control Postural Control: Deficits on evaluation Trunk Control: ataxic Righting Reactions: delayed and inadequate  Balance Balance Balance Assessed: Yes Static Sitting Balance Static Sitting - Balance Support: No upper extremity supported Static Sitting - Level of Assistance: 7: Independent Dynamic Sitting Balance Dynamic Sitting - Balance Support: No upper extremity supported Dynamic Sitting - Level of Assistance: 5: Stand by assistance Dynamic Sitting - Balance Activities: Lateral lean/weight shifting;Forward lean/weight shifting;Reaching for objects Static Standing Balance Static Standing - Balance Support: No upper extremity supported Static Standing - Level of Assistance: 4: Min assist Dynamic Standing Balance Dynamic Standing - Balance Support: Bilateral upper extremity supported Dynamic Standing - Level of Assistance: 5: Stand by assistance Dynamic Standing - Balance Activities: Reaching for  objects;Forward lean/weight shifting Extremity/Trunk Assessment RUE Assessment RUE Assessment: Exceptions to [facility] Active Range of Motion (AROM) Comments: WFL for functional tasks General Strength Comments: MMT- 4/5, able to use the RUE functionally despite ataxic movements RUE Body System: Neuro Brunstrum levels for arm and hand: Arm;Hand LUE Assessment LUE Assessment: Within Functional Limits  Skilled Therapeutic Intervention: Pt received ambulating to bathroom for toileting with RW; pt completed 3/3 toileting tasks with CGA-close spvsn. Pt ambulated throughout session around room for functional transfers at [facility] level and occasional vc's for safe RW management and hand placement. Pt showered with close spvsn seated and in stance. UB dressing min A to fasten bra in front, set up A pullover shirt; LB dressing close spvsn; donning/doffing footwear close spvsn. Pt dried hair and completed grooming tasks seated at sink. D/c assessments completed as above. Visual motor/perceptual tasks completed with Mikel Cella and functional items around room; pt provided handout and Mikel Cella to take home. Pt remained supine in bed, alarm set and all needs met.   Franne Grip Abel Ra 03/15/2021, 4:03 PM

## 2021-03-15 NOTE — Progress Notes (Signed)
STROKE TEAM PROGRESS NOTE   SUBJECTIVE (INTERVAL HISTORY) Husband is at bedside.  Patient sitting in chair for lunch. She wears eye occluder for diplopia. However, she stated that her diplopia and speech are improving. Still has left UE ataxia. CT head repeat today showed evolving hemorrhagic conversion which is undergoing absorption. Less mass effect, no hydrocephalus. Will start ASA 81  OBJECTIVE Temp:  [98.6 F (37 C)-99.1 F (37.3 C)] 99.1 F (37.3 C) (05/30 0342) Pulse Rate:  [87-89] 89 (05/30 0342) Resp:  [17-18] 18 (05/30 0342) BP: (112-115)/(61-72) 112/61 (05/30 0342) SpO2:  [100 %] 100 % (05/30 0342)  No results for input(s): GLUCAP in the last 168 hours. Recent Labs  Lab 03/11/21 0500  NA 137  K 3.8  CL 102  CO2 27  GLUCOSE 117*  BUN 12  CREATININE 0.65  CALCIUM 9.0   Recent Labs  Lab 03/11/21 0500  AST 36  ALT 30  ALKPHOS 61  BILITOT 1.1  PROT 7.1  ALBUMIN 3.2*   Recent Labs  Lab 03/11/21 0500 03/15/21 0751  WBC 5.3 5.4  NEUTROABS 2.4 2.8  HGB 11.9* 12.4  HCT 35.7* 36.7  MCV 86.4 87.4  PLT 293 277   No results for input(s): CKTOTAL, CKMB, CKMBINDEX, TROPONINI in the last 168 hours. No results for input(s): LABPROT, INR in the last 72 hours. No results for input(s): COLORURINE, LABSPEC, PHURINE, GLUCOSEU, HGBUR, BILIRUBINUR, KETONESUR, PROTEINUR, UROBILINOGEN, NITRITE, LEUKOCYTESUR in the last 72 hours.  Invalid input(s): APPERANCEUR     Component Value Date/Time   CHOL 105 02/23/2021 0745   CHOL 191 09/09/2015 0804   TRIG 28 02/23/2021 0745   HDL 48 02/23/2021 0745   HDL 60 09/09/2015 0804   CHOLHDL 2.2 02/23/2021 0745   VLDL 6 02/23/2021 0745   LDLCALC 51 02/23/2021 0745   LDLCALC 120 (H) 09/09/2015 0804   Lab Results  Component Value Date   HGBA1C 6.0 (H) 02/23/2021      Component Value Date/Time   LABOPIA NONE DETECTED 02/23/2021 0033   COCAINSCRNUR NONE DETECTED 02/23/2021 0033   LABBENZ NONE DETECTED 02/23/2021 0033   AMPHETMU  NONE DETECTED 02/23/2021 0033   THCU NONE DETECTED 02/23/2021 0033   LABBARB NONE DETECTED 02/23/2021 0033    No results for input(s): ETH in the last 168 hours.  I have personally reviewed the radiological images below and agree with the radiology interpretations.  CT ANGIO HEAD NECK W WO CM  Result Date: 03/03/2021 CLINICAL DATA:  Stroke follow-up. EXAM: CT ANGIOGRAPHY HEAD AND NECK TECHNIQUE: Multidetector CT imaging of the head and neck was performed using the standard protocol during bolus administration of intravenous contrast. Multiplanar CT image reconstructions and MIPs were obtained to evaluate the vascular anatomy. Carotid stenosis measurements (when applicable) are obtained utilizing NASCET criteria, using the distal internal carotid diameter as the denominator. CONTRAST:  50mL OMNIPAQUE IOHEXOL 350 MG/ML SOLN COMPARISON:  CT angiogram of the head and neck Feb 23, 2021. FINDINGS: CTA NECK FINDINGS Aortic arch: 4 vessel aortic arch with direct origin of the left vertebral artery from the aortic arch in. Imaged portion shows no evidence of aneurysm or dissection. No significant stenosis of the major arch vessel origins. Right carotid system: Minimal atherosclerotic changes of the right carotid bifurcation without hemodynamically significant stenosis. Left carotid system: Mild atherosclerotic changes of the left carotid bifurcation without hemodynamically significant stenosis. Vertebral arteries: Right dominant. The non dominant left vertebral artery has normal course and caliber from the aortic arch to the vertebrobasilar  junction. Area of luminal irregularity is again seen in the V3 segment of the right vertebral artery now with mild stenosis followed by small (1-2 mm) outpouching suggesting pseudoaneurysm. There is also luminal irregularity at the V4 segment. Skeleton: Negative. Other neck: Negative. Upper chest: Negative. Review of the MIP images confirms the above findings CTA HEAD FINDINGS  Anterior circulation: No significant stenosis, proximal occlusion, aneurysm, or vascular malformation. Posterior circulation: Mild luminal irregularity of the V4 segment of the right vertebral artery which involves the origin of the right PICA which appear patent. The basilar artery is small in caliber without area of focal stenosis, likely related to the presence of bilateral prominent posterior communicating arteries. The right P1/PCA segment is absent (fetal PCA). Interval recanalization of the right superior cerebellar artery. Venous sinuses: As permitted by contrast timing, patent. Anatomic variants: Right fetal PCA. Prominent left posterior communicating artery. Review of the MIP images confirms the above findings IMPRESSION: 1. Luminal irregularity in the V3 and V4 segments of the right vertebral artery is suggestive of dissection without hemodynamically significant stenosis. Area of irregularity of the origin of the right PICA which remains patent. There is a new diminutive outpouching from the right V3 segment suggestive of a small pseudoaneurysm. 2. Interval recanalization of the right superior cerebellar artery. 3. No significant findings in the anterior circulation. Electronically Signed   By: Baldemar Lenis M.D.   On: 03/03/2021 12:54   CT HEAD WO CONTRAST  Result Date: 03/15/2021 CLINICAL DATA:  Stroke follow-up EXAM: CT HEAD WITHOUT CONTRAST TECHNIQUE: Contiguous axial images were obtained from the base of the skull through the vertex without intravenous contrast. COMPARISON:  Three days ago FINDINGS: Brain: Hemorrhagic infarct in the right cerebellum shows decreased swelling and cerebellar distortion. Continued fourth ventricular distortion but stable unremarkable lateral ventricular volume. No evidence of acute hemorrhage or new infarct. 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 No  hydrocephalus. Electronically Signed   By: Marnee Spring M.D.   On: 03/15/2021 05:08   CT HEAD WO CONTRAST  Result Date: 03/12/2021 CLINICAL DATA:  Stroke follow-up EXAM: CT HEAD WITHOUT CONTRAST TECHNIQUE: Contiguous axial images were obtained from the base of the skull through the vertex without intravenous contrast. 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 No hemorrhage in this infarct Relatively large area of acute/subacute infarct in the right superior cerebellar artery territory with hemorrhagic transformation, unchanged. This infarct shows diffuse edema and considerable mass-effect on the fourth ventricle which is flattened and displaced to the left. There is mild obstructive hydrocephalus, slightly improved from the prior study. No new area of infarct or hemorrhage since yesterday. Vascular: Negative for hyperdense vessel Skull: Negative Sinuses/Orbits: Moderate mucosal edema left maxillary sinus unchanged. Remaining sinuses clear. 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 Electronically Signed   By: Marlan Palau M.D.   On: 03/12/2021 07:44   CT Head Wo Contrast  Result Date: 03/10/2021 CLINICAL DATA:  Mar 06, 2021. EXAM: CT HEAD WITHOUT CONTRAST TECHNIQUE: Contiguous axial images were obtained from the base of the skull through the vertex without intravenous contrast. COMPARISON:  CT head Mar 06, 2021. FINDINGS: Brain: Large area of hypodensity involving the  inferior right cerebellum, compatible with PICA territory infarct. Increased acute hemorrhage associated with the right superior cerebellar infarct, compatible with  worsening hemorrhagic transformation. There is new intraventricular extension of hemorrhage into the fourth ventricle (series 3, image 9 and series 6, image 31). There is persistent mass effect on the fourth ventricle which is effaced and displaced to the left. Increased edema in the superior right cerebellum surrounding the hemorrhage. There is increasing ventriculomegaly with increased rounding of the temporal horns and outwardly convex third ventricle, compatible with worsening hydrocephalus. No supratentorial midline shift. No definite interval/new large vascular territory infarct. Vascular: No hyperdense vessel identified. Calcific atherosclerosis. Skull: No acute fracture. Sinuses/Orbits: Paranasal sinus mucosal thickening with air-fluid levels in the left maxillary and left sphenoid sinuses. Right ethmoid osteoma. Other: Clear mastoid air cells. IMPRESSION: 1. 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 Increased edema in the superior right cerebellum surrounding the hemorrhage. 2. Progressive ventriculomegaly, compatible with developing obstructive hydrocephalus. These results will be called to the ordering clinician or representative by the Radiologist Assistant, and communication documented in the PACS or [employer]. Electronically Signed   By: Feliberto Harts MD   On: 03/10/2021 07:33   CT HEAD WO CONTRAST  Result Date: 03/06/2021 CLINICAL DATA:  Stroke follow-up EXAM: CT HEAD WITHOUT CONTRAST TECHNIQUE: Contiguous axial images were obtained from the base of the skull through the vertex without intravenous contrast. COMPARISON:  CT head 03/16/2021 FINDINGS: Brain: Large hypodensity right inferior cerebellum compatible with PICA infarct unchanged. Hemorrhagic transformation of right superior cerebellar infarct unchanged from  earlier today. There is mass-effect on the fourth ventricle which is effaced and mildly displaced to the left. Mild ventricular enlargement is present unchanged from earlier today but progressed since 02/23/2021 Chronic infarct left superior cerebellum unchanged. Vascular: Negative for hyperdense vessel Skull: Negative Sinuses/Orbits: Mucosal edema paranasal sinuses. Air-fluid level left sphenoid sinus. Osteoma right ethmoid sinus. Mastoid clear bilaterally. Other: None IMPRESSION: Stable CT head. 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 Electronically Signed   By: Marlan Palau M.D.   On: 03/06/2021 15:56   CT HEAD WO CONTRAST  Addendum Date: 03/06/2021   ADDENDUM REPORT: 03/06/2021 07:54 ADDENDUM: Study discussed by telephone with Dr. Iver Nestle on 03/06/2021 at 0611 hours. Electronically Signed   By: Odessa Fleming M.D.   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 Subsequent encounter. EXAM: CT HEAD WITHOUT CONTRAST TECHNIQUE: Contiguous axial images were obtained from the base of the skull through the vertex without intravenous contrast. COMPARISON:  Head CT 03/04/2021 and earlier. FINDINGS: Brain: Lobular hyperdense hemorrhage has progressed in the right SCA territory since 03/04/2021, with increasing regional edema and mass effect compatible with progressive hemorrhagic transformation (coronal image 46). The volume of hyperdense blood there is approximately 3 mL. Confluent right PICA infarct with cytotoxic edema which has mildly progressed since 03/03/2021. Combined, there is mild progressive basilar cistern effacement, although the cisterna magna remains patent. Fourth ventricle remains effaced. Since 03/03/2021 lateral and 3rd ventricle size is slightly  increased (series 3, image 14 today versus series 3, image 17 on 03/03/2021). No supratentorial transependymal edema. Supratentorial gray-white matter differentiation preserved with no midline shift. No supratentorial hemorrhage. Vascular: Mild Calcified  atherosclerosis at the skull base. Skull: Stable. Small benign right frontal bone exostosis on series 4, image 41. Sinuses/Orbits: Stable paranasal sinuses. Tympanic cavities and mastoids remain clear. Other: Stable leftward gaze deviation. Visualized scalp soft tissues are within normal limits. IMPRESSION: 1. 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 2. At the same time cytotoxic edema from the large right PICA infarct has mildly progressed. 3. Subsequent increased posterior fossa mass although the pre medullary cistern and cisterna magna remain patent. 4. There has been subtle enlargement of the lateral and 3rd ventricles since 03/03/2021, but there is no transependymal edema. Electronically Signed: By: Odessa Fleming M.D. On: 03/06/2021 06:05   CT HEAD WO CONTRAST  Result Date: 03/04/2021 CLINICAL DATA:  60 year old female with new right PICA infarct superimposed on recent right SCA infarct. EXAM: CT HEAD WITHOUT CONTRAST TECHNIQUE: Contiguous axial images were obtained from the base of the skull through the vertex without intravenous contrast. COMPARISON:  Brain MRI, CTA head and neck, CT head yesterday. FINDINGS: Brain: Supratentorial gray-white matter differentiation remains stable. And the lateral and 3rd ventricle size and configuration is stable with temporal horns remaining diminutive on series 3, image 9. Subacute right SCA infarct with petechial hemorrhage and more acute confluent right PICA infarct with cytotoxic edema redemonstrated. Stable posterior fossa mass effect including on the 4th ventricle. Superimposed chronic left SCA cerebellar infarct. Posterior  fossa gray-white matter differentiation and basilar cisterns are stable since 0645 hours yesterday. Vascular: Mild Calcified atherosclerosis at the skull base. Skull: Stable.  No acute osseous abnormality identified. Sinuses/Orbits: Stable occasional sinus mucosal thickening and bubbly opacity. Tympanic cavities and mastoids remain clear. Other: Leftward gaze deviation as before. Visualized scalp soft tissues are within normal limits. IMPRESSION: 1. Stable since yesterday. Confluent Right PICA infarct superimposed on subacute Right SCA infarct with petechial hemorrhage. Stable posterior fossa mass effect. No lateral or 3rd ventriculomegaly at this time. 2. No new intracranial abnormality. Electronically Signed   By: Odessa Fleming M.D.   On: 03/04/2021 06:08   CT HEAD WO CONTRAST  Addendum Date: 03/03/2021   ADDENDUM REPORT: 03/03/2021 08:53 ADDENDUM: Study discussed by telephone with Dr. [name] on 03/03/2021 at 0847 hours. Electronically Signed   By: Odessa Fleming M.D.   On: 03/03/2021 08:53   Result Date: 03/03/2021 CLINICAL DATA:  60 year old female with right SCA cerebellar infarct on MRI 02/23/2021. Subsequent encounter. EXAM: CT HEAD WITHOUT CONTRAST TECHNIQUE: Contiguous axial images were obtained from the base of the skull through the vertex without intravenous contrast. COMPARISON:  Head CT 03/01/2021 and earlier. FINDINGS: Brain: Right superior cerebellar artery territory and cerebellar vermis cytotoxic edema with petechial hemorrhage. But new confluent cytotoxic edema now in the right PICA territory (series 3, image 5). Subsequent increased posterior fossa mass effect, with decreased prepontine and pre medullary cisterns. No tonsillar herniation. Increased effacement of the 4th ventricle, but lateral and 3rd ventricle size not significantly changed at this time. Supratentorial gray and white matter signal is stable. No new intracranial hemorrhage. Vascular: Mild Calcified atherosclerosis at the skull base. No  suspicious intracranial vascular hyperdensity. Skull: No acute osseous abnormality identified. Small benign right frontal bone exostosis on series 4, image 59. Sinuses/Orbits: Stable mucosal thickening and bubbly opacity in the left sphenoid, left maxillary, and posterior right ethmoid sinuses. Tympanic cavities and mastoids remain clear. Other: Leftward gaze deviation. Visualized scalp soft tissues are within normal limits. IMPRESSION: 1. Confluent acute right PICA infarct  is new since 03/01/2021, superimposed on the recent right SCA infarct. Subsequent increased cytotoxic edema in the right cerebellum with increased posterior fossa mass effect including effaced 4th ventricle. But no malignant hemorrhagic transformation or ventriculomegaly at this time. 2. Supratentorial brain appears stable, negative. Electronically Signed: By: Odessa Fleming M.D. On: 03/03/2021 08:42   CT HEAD WO CONTRAST  Result Date: 03/01/2021 CLINICAL DATA:  Stroke follow-up. EXAM: CT HEAD WITHOUT CONTRAST TECHNIQUE: Contiguous axial images were obtained from the base of the skull through the vertex without intravenous contrast. COMPARISON:  CT head and MRI Feb 23, 2021. FINDINGS: Brain: Increased edema associated with the right cerebellar and right dorsal midbrain infarct. Increased resulting mass effect on the fourth ventricle without hydrocephalus. Interval development of acute hemorrhage at the site of right cerebellar infarct. No evidence of new/interval acute large vascular territory infarct. Remote left cerebellar infarct. Basal cisterns are patent. No inferior cerebellar tonsillar herniation or midline shift. No mass lesion. Vascular: No hyperdense vessel identified. Skull: No acute fracture. Sinuses/Orbits: Mucosal thickening the left maxillary sinus, right posterior ethmoid air cell and left sphenoid sinus with frothy secretions in the sinuses. Unremarkable orbits. Other: No mastoid effusions. IMPRESSION: 1. Increased edema associated  with the right cerebellar and right dorsal midbrain infarcts with interval development of acute hemorrhage at the site of right cerebellar infarct. Increased resulting mass effect on the fourth ventricle without hydrocephalus. 2. Paranasal sinus mucosal thickening with frothy secretions, as detailed above. Electronically Signed   By: Feliberto Harts MD   On: 03/01/2021 14:39   MR BRAIN WO CONTRAST  Result Date: 03/03/2021 CLINICAL DATA:  Stroke follow-up EXAM: MRI HEAD WITHOUT CONTRAST TECHNIQUE: Multiplanar, multiecho pulse sequences of the brain and surrounding structures were obtained without intravenous contrast. COMPARISON:  Head CT Mar 03, 2021. FINDINGS: Brain: Large area of restricted diffusion involving the inferior right cerebellar hemisphere consistent with acute/subacute right PICA territory infarct. Patchy T1 hyperintensity with prominent susceptibility artifact in the superior aspect of the right cerebellar hemisphere with surrounding vasogenic edema is suggestive of hemorrhagic transformation of non prior right SCA territory infarct. Edema and mass effect from these 2 areas of infarcts resulting in leftward midline shift with mass effect on the fourth ventricle which remains patent as well as mass effect on the posterior aspect of the pons and mid brain with mild periaqueductal edema and small ascending transtentorial herniation. No tonsillar herniation. No hydrocephalus. Overall, findings are similar to prior CT performed earlier today Area of encephalomalacia and gliosis with hemosiderin staining in the superior left cerebellar hemisphere consistent with chronic left SCA territory infarct. Vascular: Normal flow voids. Skull and upper cervical spine: Normal marrow signal. Sinuses/Orbits: Mucosal thickening with bubbly secretion in the left maxillary and sphenoid sinuses. The orbits are maintained. Other: Right mastoid effusion. IMPRESSION: Large acute/subacute right PICA territory infarct and  late subacute right SCA territory infarct with superimposed hemorrhagic transformation. Edema from of right cerebellar hemisphere infarcts results in leftward midline shift with mass effect on the fourth ventricle, mid brain and pons without evidence of hydrocephalus. Findings appear stable compared to head CT performed earlier today. Electronically Signed   By: Baldemar Lenis M.D.   On: 03/03/2021 13:13   MR BRAIN WO CONTRAST  Result Date: 02/23/2021 CLINICAL DATA:  Dizziness, nausea and vomiting. EXAM: MRI HEAD WITHOUT CONTRAST TECHNIQUE: Multiplanar, multiecho pulse sequences of the brain and surrounding structures were obtained without intravenous contrast. COMPARISON:  CT studies same day FINDINGS: Brain: Diffusion imaging shows  a 4-5 cm region of acute infarction within the superior cerebellum on the right. Acute infarction also within the dorsal right midbrain. Old infarction within the left superior cerebellum. Mild swelling but no acute hemorrhage in the right cerebellum. Old hemosiderin residual within the left cerebellar stroke. Cerebral hemispheres are normal. No mass, hydrocephalus or extra-axial collection. Vascular: No acute vascular finding by standard MRI. Skull and upper cervical spine: Negative Sinuses/Orbits: Mucosal inflammatory changes of the left maxillary and sphenoid sinuses. Orbits negative. Other: None IMPRESSION: 4-5 cm region of acute infarction within the superior cerebellum on the right. Punctate acute infarction in the right dorsal midbrain. Mild swelling. No evidence of acute hemorrhage. Old infarction in the left superior cerebellum with atrophy, encephalomalacia and gliosis and hemosiderin deposition. Electronically Signed   By: Paulina Fusi M.D.   On: 02/23/2021 08:42   ECHOCARDIOGRAM COMPLETE  Result Date: 02/23/2021    ECHOCARDIOGRAM REPORT   Patient Name:   [facility] Date of Exam: 02/23/2021 Medical Rec #:  [id number]   Height:       62.0 in Accession #:     [id number]  Weight:       136.9 lb Date of Birth:  12-09-1960  BSA:          1.627 m Patient Age:    59 years    BP:           135/72 mmHg Patient Gender: F           HR:           64 bpm. Exam Location:  Inpatient Procedure: 2D Echo, Cardiac Doppler and Color Doppler Indications:    Stroke  History:        Patient has prior history of Echocardiogram examinations, most                 recent 01/30/2020. Stroke; Risk Factors:Hypertension.  Sonographer:    Shirlean Kelly Referring Phys: [id number] CAROLE N HALL IMPRESSIONS  1. Left ventricular ejection fraction, by estimation, is 60 to 65%. The left ventricle has normal function. The left ventricle has no regional wall motion abnormalities. Left ventricular diastolic parameters are consistent with Grade I diastolic dysfunction (impaired relaxation).  2. Right ventricular systolic function is normal. The right ventricular size is normal.  3. Left atrial size was mildly dilated.  4. The mitral valve is normal in structure. No evidence of mitral valve regurgitation. No evidence of mitral stenosis.  5. The aortic valve is normal in structure. Aortic valve regurgitation is trivial. No aortic stenosis is present.  6. The inferior vena cava is normal in size with greater than 50% respiratory variability, suggesting right atrial pressure of 3 mmHg. Comparison(s): No significant change from prior study. Prior images reviewed side by side. FINDINGS  Left Ventricle: Left ventricular ejection fraction, by estimation, is 60 to 65%. The left ventricle has normal function. The left ventricle has no regional wall motion abnormalities. The left ventricular internal cavity size was normal in size. There is  no left ventricular hypertrophy. Left ventricular diastolic parameters are consistent with Grade I diastolic dysfunction (impaired relaxation). Indeterminate filling pressures. Right Ventricle: The right ventricular size is normal. No increase in right ventricular wall thickness.  Right ventricular systolic function is normal. Left Atrium: Left atrial size was mildly dilated. Right Atrium: Right atrial size was normal in size. Pericardium: There is no evidence of pericardial effusion. Mitral Valve: The mitral valve is normal in structure. No evidence of mitral valve regurgitation.  No evidence of mitral valve stenosis. Tricuspid Valve: The tricuspid valve is normal in structure. Tricuspid valve regurgitation is trivial. No evidence of tricuspid stenosis. Aortic Valve: The aortic valve is normal in structure. Aortic valve regurgitation is trivial. No aortic stenosis is present. Aortic valve mean gradient measures 3.0 mmHg. Aortic valve peak gradient measures 5.9 mmHg. Aortic valve area, by VTI measures 2.32 cm. Pulmonic Valve: The pulmonic valve was normal in structure. Pulmonic valve regurgitation is not visualized. No evidence of pulmonic stenosis. Aorta: The aortic root is normal in size and structure. Venous: The inferior vena cava is normal in size with greater than 50% respiratory variability, suggesting right atrial pressure of 3 mmHg. IAS/Shunts: No atrial level shunt detected by color flow Doppler.  LEFT VENTRICLE PLAX 2D LVIDd:         3.90 cm  Diastology LVIDs:         2.50 cm  LV e' medial:    7.18 cm/s LV PW:         1.10 cm  LV E/e' medial:  11.9 LV IVS:        1.10 cm  LV e' lateral:   8.38 cm/s LVOT diam:     1.80 cm  LV E/e' lateral: 10.2 LV SV:         54 LV SV Index:   33 LVOT Area:     2.54 cm  RIGHT VENTRICLE             IVC RV S prime:     18.40 cm/s  IVC diam: 1.20 cm TAPSE (M-mode): 3.0 cm LEFT ATRIUM             Index       RIGHT ATRIUM           Index LA diam:        3.80 cm 2.34 cm/m  RA Area:     12.80 cm LA Vol (A2C):   28.7 ml 17.64 ml/m RA Volume:   29.20 ml  17.94 ml/m LA Vol (A4C):   34.8 ml 21.39 ml/m LA Biplane Vol: 32.4 ml 19.91 ml/m  AORTIC VALVE AV Area (Vmax):    2.20 cm AV Area (Vmean):   2.19 cm AV Area (VTI):     2.32 cm AV Vmax:            121.50 cm/s AV Vmean:          81.900 cm/s AV VTI:            0.233 m AV Peak Grad:      5.9 mmHg AV Mean Grad:      3.0 mmHg LVOT Vmax:         105.00 cm/s LVOT Vmean:        70.400 cm/s LVOT VTI:          0.212 m LVOT/AV VTI ratio: 0.91  AORTA Ao Root diam: 2.50 cm Ao Asc diam:  2.90 cm MITRAL VALVE                TRICUSPID VALVE MV Area (PHT): 3.17 cm     TR Peak grad:   20.8 mmHg MV Decel Time: 239 msec     TR Vmax:        228.00 cm/s MV E velocity: 85.10 cm/s MV A velocity: 118.00 cm/s  SHUNTS MV E/A ratio:  0.72         Systemic VTI:  0.21 m  Systemic Diam: 1.80 cm Thurmon Fair MD Electronically signed by Thurmon Fair MD Signature Date/Time: 02/23/2021/4:12:19 PM    Final    CUP PACEART REMOTE DEVICE CHECK  Result Date: 03/15/2021 ILR summary report received. Battery status OK. Normal device function. No new symptom, tachy, brady, or pause episodes. No new AF episodes. Monthly summary reports and ROV/PRN Hassell Halim, RN, CCDS, [employer]  CT HEAD CODE STROKE WO CONTRAST  Result Date: 02/23/2021 CLINICAL DATA:  Code stroke. Initial evaluation for neuro deficit, stroke suspected. EXAM: CT HEAD WITHOUT CONTRAST TECHNIQUE: Contiguous axial images were obtained from the base of the skull through the vertex without intravenous contrast. COMPARISON:  Prior CT from 01/31/2020. FINDINGS: Brain: Streak/beam hardening artifact emanating from the left occipital calvarium noted, mildly limiting assessment. Cerebral volume within normal limits. Focal encephalomalacia at the central and left superior cerebellum, consistent with a chronic left MCA distribution infarct. Subtle evolving hypodensity noted involving the superior right cerebellum and cerebellar vermis, concerning for an acute right SCA distribution infarct (series 3, image 12, 11). No associated hemorrhage or mass effect. No other evidence for acute large vessel territory infarct. No intracranial hemorrhage. No  mass lesion, mass effect or midline shift. No hydrocephalus or extra-axial fluid collection. Vascular: No hyperdense vessel. Skull: Scalp soft tissues demonstrate no acute finding. Small focal exostosis noted at the right frontal calvarium. Calvarium intact. Sinuses/Orbits: Globes and orbital soft tissues within normal limits. Moderate left sphenoid and maxillary sinusitis. Additional scattered pneumatized secretions noted within the ethmoidal air cells. Subcentimeter osteoma noted within the right ethmoidal air cells as well. Mastoid air cells are clear. Other: None. ASPECTS [facility] Stroke Program Early CT Score) - Ganglionic level infarction (caudate, lentiform nuclei, internal capsule, insula, M1-M3 cortex): 7 - Supraganglionic infarction (M4-M6 cortex): 3 Total score (0-10 with 10 being normal): 10 IMPRESSION: 1. Evolving hypodensity involving the superior right cerebellum/cerebellar vermis, concerning for an acute right SCA distribution infarct. No intracranial hemorrhage or mass effect. 2. ASPECTS is 10. 3. Underlying chronic left SCA distribution infarcts. Critical Value/emergent results were called by telephone at the time of interpretation on 02/23/2021 at 12:55 a.m. to provider Dr. Amada Jupiter, Who verbally acknowledged these results. Electronically Signed   By: Rise Mu M.D.   On: 02/23/2021 01:23   CT ANGIO HEAD NECK W WO CM (CODE STROKE)  Result Date: 02/23/2021 CLINICAL DATA:  Initial evaluation for neuro deficit, stroke suspected. EXAM: CT ANGIOGRAPHY HEAD AND NECK TECHNIQUE: Multidetector CT imaging of the head and neck was performed using the standard protocol during bolus administration of intravenous contrast. Multiplanar CT image reconstructions and MIPs were obtained to evaluate the vascular anatomy. Carotid stenosis measurements (when applicable) are obtained utilizing NASCET criteria, using the distal internal carotid diameter as the denominator. CONTRAST:  75mL OMNIPAQUE  IOHEXOL 350 MG/ML SOLN COMPARISON:  Prior head CT from earlier the same day. FINDINGS: CTA NECK FINDINGS Aortic arch: Visualized aortic arch normal in caliber with normal branch pattern. No stenosis about the origin of the great vessels. Right carotid system: Right common carotid artery patent from its origin to the bifurcation without stenosis. Minimal plaque about the right carotid bulb without stenosis. Right ICA patent distally without stenosis, dissection or occlusion. Left carotid system: Left CCA patent from its origin to the bifurcation without stenosis. Minimal plaque about the left carotid bulb without significant stenosis. Left ICA patent distally without stenosis, dissection or occlusion. Vertebral arteries: Left vertebral artery arises directly from the aortic arch. Right vertebral artery dominant.  Vertebral arteries widely patent within the neck. Just prior to the skull base/dural reflection, there is distal right V3 segment (series 9, image 83), suspicious for a possible short-segment dissection. Small amount of probable intraluminal thrombus protrudes into the vascular lumen at this level. No frank raised dissection flap is seen. No more than mild stenosis at this level. The right vertebral artery is relatively normal just distally as it crosses into the cranial vault. Skeleton: No acute osseous finding. No discrete or worrisome osseous lesions. Mild cervical spondylosis noted at C5-6 without significant stenosis. Other neck: No other acute soft tissue abnormality within the neck. No mass or adenopathy. Upper chest: Visualized upper chest demonstrates no acute finding. Review of the MIP images confirms the above findings CTA HEAD FINDINGS Anterior circulation: Petrous, cavernous, and supraclinoid segments patent without stenosis or other abnormality. A1 segments widely patent. Normal anterior communicating artery complex. Anterior cerebral arteries patent to their distal aspects without stenosis. No  M1 stenosis or occlusion. Normal MCA bifurcations. Distal MCA branches well perfused and symmetric. Posterior circulation: Both V4 segments patent to the vertebrobasilar junction without stenosis. Both PICA origins patent and normal. Basilar somewhat diminutive but patent to its distal aspect without stenosis. Left superior cerebral artery patent. There is occlusion of the mid-distal right superior cerebellar artery, likely embolic (series 8, image 105). Left PCA supplied via the basilar as well as a prominent left posterior communicating artery. Predominant fetal type origin of the right PCA. Both PCAs remain widely patent to their distal aspects. Venous sinuses: Grossly patent allowing for timing the contrast bolus. Anatomic variants: Predominant fetal type origin of the right PCA. Review of the MIP images confirms the above findings IMPRESSION: 1. Focal intimal irregularity involving the distal right V3 segment, likely reflecting a short-segment dissection. No frank raised dissection flap or significant luminal narrowing. 2. 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 3. Mild for age atheromatous disease elsewhere about the major arterial vasculature of the head and neck. No other hemodynamically significant or correctable stenosis. These results were communicated to Dr. Amada Jupiter at 1:22 amon 5/10/2022by text page via the [facility] messaging system. Electronically Signed   By: Rise Mu M.D.   On: 02/23/2021 01:38    PHYSICAL EXAM  Temp:  [98.6 F (37 C)-99.1 F (37.3 C)] 99.1 F (37.3 C) (05/30 0342) Pulse Rate:  [87-89] 89 (05/30 0342) Resp:  [17-18] 18 (05/30 0342) BP: (112-115)/(61-72) 112/61 (05/30 0342) SpO2:  [100 %] 100 % (05/30 0342)  General - well nourished, well developed, in no apparent distress.    Ophthalmologic - fundi not visualized due to noncooperation.    Cardiovascular - regular rhythm and rate  Mental Status -   Level of arousal and orientation to time, place, and person were intact. Language including expression, naming, repetition, comprehension, reading, and writing was assessed and found intact Fund of Knowledge was assessed and was intact.  Cranial Nerves II - XII - II - Vision intact OU. III, IV, VI - Extraocular movements intact, but with subjective binocular diplopia bilateral gaze V - Facial sensation intact bilaterally. VII - Facial movement intact bilaterally. VIII - few beats of gaze evoked nystagmus on bilateral gaze, L>R X - Palate elevates symmetrically. XI - Chin turning & shoulder shrug intact bilaterally. XII - Tongue protrusion intact.  Motor Strength - The patient's strength was normal in all extremities and pronator drift was absent.   Motor Tone & Bulk - Muscle tone was assessed at the  neck and appendages and was normal.  Bulk was normal and fasciculations were absent.   Reflexes - The patient's reflexes were normal in all extremities and she had no pathological reflexes.  Sensory - Light touch, temperature/pinprick were assessed and were normal.    Coordination - The patient had normal movements in the left hand and feet with no ataxia or dysmetria.    Slight right finger-to-nose dysmetria .  Tremor was absent.  Gait and Station - deferred   ASSESSMENT/PLAN Ms. Carlyon Nolasco is a 60 y.o. female with history of recurrent stroke, hypertension admitted on 02/26/2021 for rehab due to right cerebellar infarct. She has been working with therapist pretty well resolved vertigo or N/V. however, developed acute onset vertigo and N/V on 03/01/21. Stroke team was called back for evaluation.   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    MRI confirmed right superior cerebellum  moderate infarct and punctate dorsal midbrain infarct.    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 Edema from of right cerebellar hemisphere infarcts results in leftward midline shift with mass effect on the fourth ventricle, mid brain and pons without evidence of hydrocephalus.  CT head 03/04/21 stable right PICA and SCA infarcts, no hydrocephalus.  CT head 03/06/21 progression of right SCA hemorrhagic transformation.  Large right PICA infarct edema mildly progressed.  CT head 03/10/21: Large right PICA infarct with increased acute hemorrhagic transformation and new IVH into the 4th ventricle. Increased edema in the superior right cerebellum surrounding the hemorrhage.Progressive ventriculomegaly, compatible with developing obstructive hydrocephalus.  CT repeat 5/30 evolving hemorrhagic conversion and less mass effect, no hydrocephalus  Dr. [name] recommend outpt cerebral angiogram, but not scheduled yet  Loop recorder interrogation negative.    LDL 51  A1c 6.0.   echo 02/23/21 EF 60 to 65%.  No cardiac source of embolism.Marland Kitchen    Hypercoagulable work-up repeated again negative.    Was on aspirin 81 and the Brilinta 90 twice daily for 4 weeks and then Brilinta alone. Given hemorrhagic conversion, brilinta discontinued, continued on ASA 81. With new infarct and possible dissection, added plavix 5/19 after CT stable but stopped both after serial follow-up CT showed worsening  of hemorrhage. CT repeat 5/30 stable, re-start ASA 81.  Ongoing aggressive stroke risk factor management  Therapy recommendations:  CIR  ? Vertebral dissection  CTA head and neck 5/10 showed short segment dissection right V3 with downstream occlusion of mid distal right SCA.  CTA head and neck 5/18 - right V3 and V4 luminal irregularity, suggestive dissection, new V3 outpouching suggestive small pseudoaneurysm  Plan to have cerebral angiogram after CIR, not scheduled yet due to hemorrhagic conversion  Follow up with Dr. [name] for angiogram timing  Hx of stroke  left cerebellar stroke in 01/2020 status post tPA admitted to [facility].  At that time, CTA head and neck no  LVO or stenosis, MRI showed left superior cerebellar infarct.  EF normal range.  TCD bubble study negative.  LDL 138, A1c 6.0.  Hypercoagulable work-up negative.  Bilateral lower extremity DVT negative.  Discharged with DAPT for 3 weeks and then aspirin alone, as well as Lipitor 40.  After discharge she follow-up with Dr. [name] at [facility], had loop recorder placed in 02/2020.  Hypertension  Stable  Resumed amlodipine   BP goal < 160 given hemorrhagic conversion  Long-term BP goal normotensive  Hyperlipidemia  Home meds:  Lipitor   LDL 51, at goal < 70  Continue lipitor 40  Continue statin at discharge  Other Stroke Risk Factors    Other Active Problems    Hospital day # 17   [name], MD PhD Stroke Neurology 03/15/2021 12:52 PM  To contact Stroke Continuity provider, please refer to [email or web address]. After hours, contact General Neurology

## 2021-03-15 NOTE — Progress Notes (Signed)
Physical Therapy Session Note  Patient Details  Name: Judy Gutierrez MRN: [id number] Date of Birth: 04-Sep-1961  Today's Date: 03/15/2021 PT Individual Time: 0902-0930 PT Individual Time Calculation (min): 28 min   Short Term Goals: Week 1:  PT Short Term Goal 1 (Week 1): Patient will complete bed<> wc transfers with LRAD and CGA consistently PT Short Term Goal 1 - Progress (Week 1): Met PT Short Term Goal 2 (Week 1): Patient will ambulate at least 183f with LRAD and CGA consistently PT Short Term Goal 2 - Progress (Week 1): Not met PT Short Term Goal 3 (Week 1): Patient will improve BBS >5 pts PT Short Term Goal 3 - Progress (Week 1): Not met Week 2:  PT Short Term Goal 1 (Week 2): STG = LTG due to ELOS  Skilled Therapeutic Interventions/Progress Updates:   Pt received supine in bed and agreeable to PT. Supine>sit transfer without assist. Pt donned socks sitting EOB with supervision assist for safety and set up. Mod assist to don shoes for time management. Ambulatory transfer to bathroom with RW and CGA. Cues for AD amangemen over threshold. Pt able to perform pericare and clothing management with supervision assist. Hand hygiene standing at sink with supervision assist and cues for wide BOS for improved balance.   [facility] with RW x 1840fand supervision assist-CGA for safety and cues for improved hip/knee flexion with limb advancement to improve heel contact and reduce circumduction pattern.   Standing balance and coordination task of simple peg board puzzle. Supervision assist for balance and problem solving to complete puzzle. Noted ataxia on the RUE, but able to perform at functional level without assist from PT.   Pt returned to room and performed stand pivot transfer to bed with supervision assist and RW. Sit>supine completed without assist, and left supine in bed with call bell in reach and all needs met.        Therapy  Documentation Precautions:  Precautions Precautions: Fall Precaution Comments: Diplopia, trunk/RUE/LE ataxic. Systolic BP <1<471estrictions Weight Bearing Restrictions: No    Pain: Pain Assessment Pain Scale: 0-10 Pain Score: 0-No pain   Therapy/Group: Individual Therapy  AuLorie Phenix/30/2022, 9:34 AM

## 2021-03-15 NOTE — Progress Notes (Signed)
Physical Therapy Discharge Summary  Patient Details  Name: Judy Gutierrez MRN: [id number] Date of Birth: 1961/02/18  Today's Date: 03/15/2021 PT Individual Time: 1115-1205 PT Individual Time Calculation (min): 50 min    Patient has met 7 of 7 long term goals due to improved activity tolerance, improved balance, improved postural control, increased strength, ability to compensate for deficits and functional use of  right upper extremity and right lower extremity.  Patient to discharge at an ambulatory level [place].   Patient's care partner is independent to provide the necessary physical assistance at discharge.  Reasons goals not met: na  Recommendation:  Patient will benefit from ongoing skilled PT services in home health setting (per family request) to continue to advance safe functional mobility, address ongoing impairments in balance, motor control, activity tolerance, safety, and minimize fall risk.  Equipment: No equipment provided ; pt owns RW and [facility] Reasons for discharge: treatment goals met and discharge from hospital  Patient/family agrees with progress made and goals achieved: Yes  PT Discharge  Treatment today: pt resting in bed.  She denied pain.  She requested using toilet.  Bed mobility to come to sitting, with supervision.  Sit> stand to RW with CGa and cues for safest hand position, pushing up with L hand and R hand on RW.  Gait to/from toilet with CGA, 20' .  Toilet transfer with CGA and RW.  Continent of bladder; peri care with supervision.  Gait assessment below.  Balance assessment below; time constraints prevented reassessment of Berg.  At end of session, pt resting in recliner with needs at hand and family member entering room.  Precautions/Restrictions- fall     Pain Pain Assessment Pain Scale: Faces Faces Pain Scale: No hurt Vision/Perception - see OT d/c assessment    Cognition Overall Cognitive Status: Within Functional Limits for tasks  assessed Arousal/Alertness: Awake/alert Selective Attention: Appears intact Memory: Appears intact Awareness: Appears intact Problem Solving: Appears intact Safety/Judgment: Appears intact Sensation Sensation Light Touch: Appears Intact Hot/Cold: Not tested Proprioception: Appears Intact Stereognosis: Appears Intact Coordination Gross Motor Movements are Fluid and Coordinated: No Fine Motor Movements are Fluid and Coordinated: No Coordination and Movement Description: ataxic trunk and RUE Heel Shin Test: WFL B;  slightly dysmetric and slower R LE Motor  Motor Motor: Ataxia Motor - Skilled Clinical Observations: trunk and RUE ataxia, very mild R hemi Motor - Discharge Observations: pt reported decreased ataxia since admission  Mobility Bed Mobility Bed Mobility: Rolling Right;Rolling Left;Right Sidelying to Sit;Left Sidelying to Sit Rolling Right: Independent Rolling Left: Independent Right Sidelying to Sit: Supervision/Verbal cueing Left Sidelying to Sit: Supervision/Verbal cueing Supine to Sit: Supervision/Verbal cueing Sit to Supine: Supervision/Verbal cueing Transfers Transfers: Sit to Stand;Stand to Sit;Stand Pivot Transfers Sit to Stand: Contact Guard/Touching assist Stand to Sit: Contact Guard/Touching assist Stand Pivot Transfers: Contact Guard/Touching assist Stand Pivot Transfer Details: Verbal cues for safe use of DME/AE Transfer (Assistive device): Rolling walker Locomotion  Gait Ambulation: Yes Gait Assistance: Contact Guard/Touching assist Gait Distance (Feet): 180 Feet Assistive device: Rolling walker Gait Assistance Details: Verbal cues for safe use of DME/AE Gait Gait: Yes Gait Pattern: Impaired Gait Pattern: Decreased trunk rotation;Decreased dorsiflexion - right;Step-through pattern;Decreased step length - left Stairs / Additional Locomotion Stairs: Yes Stairs Assistance: Supervision/Verbal cueing Stair Management Technique: One rail Left Number  of Stairs: 12 Height of Stairs: 6 (and 4) Ramp: Moderate Assistance - Patient 50 - 74% Wheelchair Mobility Wheelchair Mobility: No  Trunk/Postural Assessment  Cervical Assessment Cervical Assessment: Within  Functional Limits Thoracic Assessment Thoracic Assessment: Within Functional Limits Postural Control Postural Control: Deficits on evaluation Trunk Control: ataxic Righting Reactions: delayed and inadequate  Balance Balance Balance Assessed: Yes Static Sitting Balance Static Sitting - Balance Support: No upper extremity supported Static Sitting - Level of Assistance: 7: Independent Dynamic Sitting Balance Dynamic Sitting - Balance Support: No upper extremity supported Dynamic Sitting - Level of Assistance: 5: Stand by assistance Reach (Patient is able to reach ___ inches to right, left, forward, back): out of BOS forward and laterally R/L Dynamic Sitting - Balance Activities: Lateral lean/weight shifting;Forward lean/weight shifting Static Standing Balance Static Standing - Balance Support: No upper extremity supported Static Standing - Level of Assistance: 4: Min assist (A-P sway; does demonstrate hip and ankle strategies with minimal external perturbations) Static Standing - Comment/# of Minutes: 2 Dynamic Standing Balance Dynamic Standing - Balance Support: Bilateral upper extremity supported Dynamic Standing - Level of Assistance: 5: Stand by assistance Extremity Assessment      RLE Assessment RLE Assessment: Exceptions to [facility] General Strength Comments: grossly  4/5 hip flexion, hip abd/adduction; 4+/5 knee extension and flexion and ankle DF; all tested in sitting LLE Assessment LLE Assessment: Exceptions to [facility] General Strength Comments: grossly 4+/5    Katia Hannen 03/15/2021, 12:32 PM

## 2021-03-16 ENCOUNTER — Ambulatory Visit (INDEPENDENT_AMBULATORY_CARE_PROVIDER_SITE_OTHER): Payer: No Typology Code available for payment source

## 2021-03-16 ENCOUNTER — Other Ambulatory Visit: Payer: Self-pay | Admitting: Physical Medicine and Rehabilitation

## 2021-03-16 DIAGNOSIS — I639 Cerebral infarction, unspecified: Secondary | ICD-10-CM | POA: Diagnosis not present

## 2021-03-16 DIAGNOSIS — I10 Essential (primary) hypertension: Secondary | ICD-10-CM

## 2021-03-16 MED ORDER — POLYETHYLENE GLYCOL 3350 17 G PO PACK: 17.0000 g | PACK | Freq: Every day | ORAL | 0 refills | Status: DC | PRN

## 2021-03-16 MED ORDER — ATORVASTATIN CALCIUM 40 MG PO TABS: 80.0000 mg | ORAL_TABLET | Freq: Every day | ORAL | 3 refills | Status: DC

## 2021-03-16 NOTE — Progress Notes (Signed)
PROGRESS NOTE   Subjective/Complaints: Patient seen laying in bed this morning.  She states she slept well overnight.  She is seen by neurology yesterday, notes reviewed-ASA restarted.  ROS: Denies CP, SOB, N/V/D  Objective:   CT HEAD WO CONTRAST  Result Date: 03/15/2021 CLINICAL DATA:  Stroke follow-up EXAM: CT HEAD WITHOUT CONTRAST TECHNIQUE: Contiguous axial images were obtained from the base of the skull through the vertex without intravenous contrast. COMPARISON:  Three days ago FINDINGS: Brain: Hemorrhagic infarct in the right cerebellum shows decreased swelling and cerebellar distortion. Continued fourth ventricular distortion but stable unremarkable lateral ventricular volume. No evidence of acute hemorrhage or new infarct. 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 No hydrocephalus. Electronically Signed   By: Marnee Spring M.D.   On: 03/15/2021 05:08   Recent Labs    03/15/21 0751  WBC 5.4  HGB 12.4  HCT 36.7  PLT 277   No results for input(s): NA, K, CL, CO2, GLUCOSE, BUN, CREATININE, CALCIUM in the last 72 hours.  Intake/Output Summary (Last 24 hours) at 03/16/2021 0859 Last data filed at 03/16/2021 0715 Gross per 24 hour  Intake 474 ml  Output --  Net 474 ml        Physical Exam: Vital Signs Blood pressure (!) 140/120, pulse 83, temperature 98.3 F (36.8 C), temperature source Oral, resp. rate 18, height 5\' 2"  (1.575 m), weight 58.1 kg, last menstrual period 12/08/2010, SpO2 99 %.  Constitutional: No distress . Vital signs reviewed. HENT: Normocephalic.  Atraumatic. Eyes: EOMI. No discharge. Cardiovascular: No JVD.  RRR. Respiratory: Normal effort.  No stridor.  Bilateral clear to auscultation. GI: Non-distended.  BS +. Skin: Warm and dry.  Intact. Psych: Normal mood.  Normal behavior. Musc: No edema in  extremities.  No tenderness in extremities. Neuro: Alert Motor: 5/5 throughout RUE dysmetria and ataxia, improving LUE dysmetria improved Dysarthria improving  Assessment/Plan: 1. Functional deficits which require 3+ hours per day of interdisciplinary therapy in a comprehensive inpatient rehab setting.  Physiatrist is providing close team supervision and 24 hour management of active medical problems listed below.  Physiatrist and rehab team continue to assess barriers to discharge/monitor patient progress toward functional and medical goals  Care Tool:  Bathing    Body parts bathed by patient: Right arm,Right lower leg,Left arm,Chest,Abdomen,Front perineal area,Buttocks,Left upper leg,Left lower leg,Face,Right upper leg         Bathing assist Assist Level: Supervision/Verbal cueing     Upper Body Dressing/Undressing Upper body dressing   What is the patient wearing?: Pull over shirt,Bra    Upper body assist Assist Level: Minimal Assistance - Patient > 75% Assistive Device Comment: Min A to fasten bra hooks in modified technique; set up-mod I for pullover shirt.  Lower Body Dressing/Undressing Lower body dressing      What is the patient wearing?: Underwear/pull up,Pants     Lower body assist Assist for lower body dressing: Supervision/Verbal cueing     Toileting Toileting    Toileting assist Assist for toileting: Supervision/Verbal cueing     Transfers Chair/bed transfer  Transfers assist     Chair/bed transfer assist level:  Contact Guard/Touching assist     Locomotion Ambulation   Ambulation assist      Assist level: Contact Guard/Touching assist Assistive device: Walker-rolling Max distance: 20   Walk 10 feet activity   Assist     Assist level: Contact Guard/Touching assist Assistive device: Walker-rolling   Walk 50 feet activity   Assist    Assist level: Minimal Assistance - Patient > 75% Assistive device: Walker-rolling     Walk 150 feet activity   Assist    Assist level: Minimal Assistance - Patient > 75% Assistive device: Walker-rolling    Walk 10 feet on uneven surface  activity   Assist Walk 10 feet on uneven surfaces activity did not occur: Safety/medical concerns   Assist level: Moderate Assistance - Patient - 50 - 74% Assistive device: Photographer Will patient use wheelchair at discharge?: Yes Type of Wheelchair: Manual    Wheelchair assist level: Contact Guard/Touching assist Max wheelchair distance: 200    Wheelchair 50 feet with 2 turns activity    Assist        Assist Level: Contact Guard/Touching assist   Wheelchair 150 feet activity     Assist      Assist Level: Contact Guard/Touching assist    Medical Problem List and Plan: 1.  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 Increased edema associated with the right cerebellar and right dorsal midbrain infarcts with interval development of acute hemorrhage at the site of right cerebellar infarct. Increased resulting mass effect on the fourth ventricle without hydrocephalus.      2. Paranasal sinus mucosal thickening with frothy secretions.  Repeat CT on 5/17 showing new right PICA infarct, repeat on 5/19 showing stability, repeat CT on 5/21 shows worsening/progression of cytotoxic edema from right PICA infarct, hemorrhagic transformation of right SCA infarct, and hydrocephalus, repeat CT later on 5/20 stable.   Repeat head CT on 5/25 showing large right PICA territory infarct with increased hemorrhagic transformation and new intraventricular extension of hemorrhage into fourth ventricle with increasing edema.  Also progressive ventriculomegaly with developing  obstructive hydrocephalus.  Repeat CTA on 5/18 showing likely dissection ` Repeat head CT on 5/27, showing stable/mild improvement  Repeat head CT on 5/30 with improvement  MRI on 5/18 showing large right PICA infarct and subacute right MCA infarct with superimposed hemorrhagic transformation.  Leftward midline shift with mass-effect on fourth ventricle, but no hydrocephalus  Appreciate Neuro recs, discussed with Neuro - following closely  May require emergent ventriculostomy versus decompression if decompensated.   DC today  Will see patient for transitional care management in 1-2 weeks post-discharge 2.  Antithrombotics: -DVT/anticoagulation: Lovenox, d/ced d/t bleed              -antiplatelet therapy: Aspirin 81 mg daily d/ced as well due to increasing hemorrhage, Brilinta d/ced, Plavix initiated, but also DC'd on 5/21 due to progression of hemorrhage 3. Pain Management: Continue Tylenol as needed             Monitor, particularly for headaches with increased exertion  Controlled on 5/31 4. Mood: Provide emotional support             -antipsychotic agents: N/A 5. Neuropsych: This patient is capable  of making decisions on her own behalf. 6. Skin/Wound Care: Routine skin checks 7. Fluids/Electrolytes/Nutrition: Routine in and outs 8.  Hypertension.  Patient on Cozaar50 mg daily, Norvasc 5 mg daily prior to admission.    Norvasc 5 started on 5/16, continue             Vitals with BMI 03/16/2021 03/15/2021 03/15/2021  Height - - -  Weight - - -  BMI - - -  Systolic 140 127 182  Diastolic 120 81 62  Pulse 83 92 92   Elevated this a.m., otherwise well controlled 9. Hyperlipidemia: Lipitor 10. ABLA: Resolved  Cont to monitor 12. Transaminitis: Resolved  Cont to monitor 13. Dysarthria  Continue SLP 14.  Hyponatremia: Resolved 15.  Tachycardia  ECG reviewed, no significant change in ECG from prior  Improved  > 30 minutes spent in total in discharge planning between myself and PA  regarding aforementioned, as well discussion regarding DME equipment, follow-up appointments, follow-up therapies, discharge medications, discharge recommendations, answering questions.  Please see discharge summary as well.  LOS: 18 days A FACE TO FACE EVALUATION WAS PERFORMED  Brodin Gelpi Karis Juba 03/16/2021, 8:59 AM

## 2021-03-16 NOTE — Progress Notes (Signed)
Patient ID: Judy Gutierrez, female   DOB: [birth year], 60 y.o.   MRN: [id number] Follow up with patient regarding pending discharge. Patient has a good understanding of her medications and plan of care/testing after discharge. Reports her sister and brother in law will be at home with her when the husband is at work. She feels comfortable with the discharge and ready to go home. Pamelia Hoit

## 2021-03-16 NOTE — Progress Notes (Signed)
Patient discharged home this morning with family. Information given to patient per PA, no questions noted. Taken down via wheelchair. Vic Ripper

## 2021-03-17 ENCOUNTER — Telehealth: Payer: Self-pay

## 2021-03-17 NOTE — Telephone Encounter (Signed)
Transition Care Management Follow-up Telephone Call  Date of discharge and from where: 03/16/21[facility]  How have you been since you were released from the hospital? Feeling fine  Any questions or concerns? No  Items Reviewed:  Did the pt receive and understand the discharge instructions provided? Yes   Medications obtained and verified? Yes   Other? Yes   Any new allergies since your discharge? No   Dietary orders reviewed? Yes  Do you have support at home? Yes   Home Care and Equipment/Supplies: Were home health services ordered? Yes for PT If so, what is the name of the agency? [facility] Has the agency set up a time to come to the patient's home? yes Were any new equipment or medical supplies ordered?  No What is the name of the medical supply agency? n/a Were you able to get the supplies/equipment? not applicable Do you have any questions related to the use of the equipment or supplies?n/a  Functional Questionnaire: (I = Independent and D = Dependent) ADLs: I with assistance  Bathing/Dressing- I with assistance  Meal Prep- D  Eating- I  Maintaining continence- I with assistance  Transferring/Ambulation- I with assistance & walker  Managing Meds- D  Follow up appointments reviewed:   PCP Hospital f/u appt confirmed? Yes  Scheduled to see Dr. Laury Axon on 03/26/21 @ 11:20.  Specialist Hospital f/u appt confirmed? No  Neurology to schedule.  Are transportation arrangements needed? No   If their condition worsens, is the pt aware to call PCP or go to the Emergency Dept.? Yes  Was the patient provided with contact information for the PCP's office or ED? Yes  Was to pt encouraged to call back with questions or concerns? Yes

## 2021-03-18 ENCOUNTER — Telehealth: Payer: Self-pay

## 2021-03-18 NOTE — Telephone Encounter (Signed)
Per protocol hospital notes reviewed:  Verbal okay given to [facility] with [facility] to provide in-home physical therapy once a week for one week, then twice a week for 4 weeks. Also for an occupational evaluation & treatment. Services to address ataxia & functional mobility.

## 2021-03-22 ENCOUNTER — Telehealth: Payer: Self-pay | Admitting: Physical Medicine & Rehabilitation

## 2021-03-22 NOTE — Telephone Encounter (Signed)
Brandon with [facility] called for a HH OT verbal 2 x week for 4 weeks his number  [phone] 

## 2021-03-24 NOTE — Telephone Encounter (Deleted)
Brandon with [facility] called for a HH OT verbal 2 x week for 4 weeks his number  [phone]

## 2021-03-24 NOTE — Telephone Encounter (Signed)
Approval given to [facility] for OT. I do not have assigned MD for orders signature.  [facility] may be seeing pt as Dr Allena Katz is no longer with practice.

## 2021-03-25 ENCOUNTER — Telehealth: Payer: Self-pay | Admitting: *Deleted

## 2021-03-25 NOTE — Telephone Encounter (Signed)
Kelly PT from [facility] called to request ST eval for slurred speech. Difficult with language barrier but Tresa Endo reports she is having some lsigh slurred speech and would like to have HH ST eval. Ok to order?

## 2021-03-26 ENCOUNTER — Other Ambulatory Visit: Payer: Self-pay

## 2021-03-26 ENCOUNTER — Ambulatory Visit (INDEPENDENT_AMBULATORY_CARE_PROVIDER_SITE_OTHER): Payer: No Typology Code available for payment source | Admitting: Family Medicine

## 2021-03-26 ENCOUNTER — Encounter: Payer: Self-pay | Admitting: Family Medicine

## 2021-03-26 VITALS — BP 120/64 | HR 96 | Temp 99.2°F | Resp 18 | Ht 61.0 in | Wt 124.4 lb

## 2021-03-26 DIAGNOSIS — I639 Cerebral infarction, unspecified: Secondary | ICD-10-CM | POA: Diagnosis not present

## 2021-03-26 DIAGNOSIS — I10 Essential (primary) hypertension: Secondary | ICD-10-CM | POA: Diagnosis not present

## 2021-03-26 DIAGNOSIS — E785 Hyperlipidemia, unspecified: Secondary | ICD-10-CM | POA: Diagnosis not present

## 2021-03-26 DIAGNOSIS — I63441 Cerebral infarction due to embolism of right cerebellar artery: Secondary | ICD-10-CM | POA: Diagnosis not present

## 2021-03-26 NOTE — Assessment & Plan Note (Signed)
Pt improving  con't OT/ PT and speech therapy will also come to the house  F/u labs 3 months

## 2021-03-26 NOTE — Progress Notes (Signed)
Patient ID: Judy Gutierrez, female    DOB: 1961-09-02  Age: 60 y.o. MRN: [id number]    Subjective:  Subjective  HPI Diamonds Lippard presents for a hospital follow up today accompanied with her husband. Pt was admitted to the ED on 02/22/2021 for dizziness and was dx with acute CVA. Pt was discharged on 02/26/2021. Pt has a PMHx of HTN and embolic cerebellar stroke (2021). She attend rehab on 02/26/2021- 03/16/2021. She notes that she had a mini-stroke while in rehab. Her husband states that on 02/22/2021 around 9:30 AM, he notice that his wife has slurred speech. She complains of right hand and leg weakness. She states that her hand shakes when she grabs objects. She reports that she attends PT twice a week. She notes that she has been improving and has been walking using a walker for one hour. She also complains of visual disturbance, however it is improving over time. She endorses taking 1200 Calcium PO Daily, muti-vitamin, and 81 mg of aspirin.  She notes taking 5 mg amLODipine PO Daily for her dx of HTN. She notes that her BP at the ED was 180.  BP Readings from Last 3 Encounters:  03/26/21 120/64  03/16/21 (!) 140/120  02/26/21 (!) 146/80  She denies any chest pain, SOB, fever, abdominal pain, cough, chills, sore throat, dysuria, urinary incontinence, back pain, HA, or N/V/D at this time.   Review of Systems  Constitutional:  Negative for chills, fatigue and fever.  HENT:  Negative for ear pain, rhinorrhea, sinus pressure, sinus pain, sore throat and tinnitus.   Eyes:  Positive for visual disturbance. Negative for pain.  Respiratory:  Negative for cough, shortness of breath and wheezing.   Cardiovascular:  Negative for chest pain.  Gastrointestinal:  Negative for abdominal pain, anal bleeding, constipation, diarrhea, nausea and vomiting.  Genitourinary:  Negative for flank pain.  Musculoskeletal:  Negative for back pain and neck pain.  Skin:  Negative for rash.  Neurological:  Positive for  weakness (Right hand and leg). Negative for seizures, light-headedness and headaches.   History 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)    Stroke (HCC)    Unspecified essential hypertension     She has a past surgical history that includes Cesarean section (1992); LOOP RECORDER INSERTION (N/A, 03/05/2020); TEE without cardioversion (N/A, 03/05/2020); and Bubble study (03/05/2020).   Her family history includes Diabetes in her father; Diabetes Mellitus II in her sister; Hepatitis in her father; Pulmonary embolism in her mother.She reports that she has never smoked. She has never used smokeless tobacco. She reports that she does not drink alcohol and does not use drugs.  Current Outpatient Medications on File Prior to Visit  Medication Sig Dispense Refill   amLODipine (NORVASC) 5 MG tablet Take 1 tablet (5 mg total) by mouth daily. 90 tablet 3   aspirin 81 MG EC tablet Take 1 tablet (81 mg total) by mouth daily. 90 tablet 1   atorvastatin (LIPITOR) 40 MG tablet Take 2 tablets (80 mg total) by mouth daily. 90 tablet 3   Calcium Carbonate-Vit D-Min (CALCIUM 1200 PO) Take by mouth.     Ferrous Sulfate (IRON PO) Take by mouth.     Multiple Vitamin (MULTIVITAMIN WITH MINERALS) TABS tablet Take 1 tablet by mouth daily.     polyethylene glycol (MIRALAX / GLYCOLAX) 17 g  packet Take 17 g by mouth daily as needed. For constipation--available over the counter. 30 each 0   No current facility-administered medications on file prior to visit.     Objective:  Objective  Physical Exam Vitals and nursing note reviewed.  Constitutional:      General: She is not in acute distress.    Appearance: Normal appearance. She is well-developed. She is not ill-appearing.  HENT:     Head: Normocephalic and atraumatic.     Right Ear: External ear normal.     Left  Ear: External ear normal.     Nose: Nose normal.  Eyes:     General:        Right eye: No discharge.        Left eye: No discharge.     Extraocular Movements: Extraocular movements intact.     Pupils: Pupils are equal, round, and reactive to light.  Cardiovascular:     Rate and Rhythm: Normal rate and regular rhythm.     Pulses: Normal pulses.     Heart sounds: Normal heart sounds. No murmur heard.   No friction rub. No gallop.  Pulmonary:     Effort: Pulmonary effort is normal. No respiratory distress.     Breath sounds: Normal breath sounds. No stridor. No wheezing, rhonchi or rales.  Chest:     Chest wall: No tenderness.  Abdominal:     General: Bowel sounds are normal. There is no distension.     Palpations: Abdomen is soft. There is no mass.     Tenderness: There is no abdominal tenderness. There is no guarding or rebound.     Hernia: No hernia is present.  Musculoskeletal:        General: Normal range of motion.     Cervical back: Normal range of motion and neck supple.     Right lower leg: No edema.     Left lower leg: No edema.  Skin:    General: Skin is warm and dry.  Neurological:     Mental Status: She is alert and oriented to person, place, and time.     Motor: Weakness present.     Comments: There is 3/5 strength present in the R upper and lower extremities, however there is 5/5 strength present in the L extremities.  Pt has weakness with hip flexion. There is shaking and control difficulty present in the R hand.   Psychiatric:        Behavior: Behavior normal.        Thought Content: Thought content normal.   BP 120/64 (BP Location: Left Arm, Patient Position: Sitting, Cuff Size: Normal)   Pulse 96   Temp 99.2 F (37.3 C) (Oral)   Resp 18   Ht 5\' 1"  (1.549 m)   Wt 124 lb 6.4 oz (56.4 kg)   LMP 12/08/2010   SpO2 99%   BMI 23.51 kg/m  Wt Readings from Last 3 Encounters:  03/26/21 124 lb 6.4 oz (56.4 kg)  03/05/21 128 lb 1.4 oz (58.1 kg)  02/23/21 136 lb  14.5 oz (62.1 kg)     Lab Results  Component Value Date   WBC 5.4 03/15/2021   HGB 12.4 03/15/2021   HCT 36.7 03/15/2021   PLT 277 03/15/2021   GLUCOSE 117 (H) 03/11/2021   CHOL 105 02/23/2021   TRIG 28 02/23/2021   HDL 48 02/23/2021   LDLCALC 51 02/23/2021   ALT 30 03/11/2021   AST 36 03/11/2021   NA  137 03/11/2021   K 3.8 03/11/2021   CL 102 03/11/2021   CREATININE 0.65 03/11/2021   BUN 12 03/11/2021   CO2 27 03/11/2021   TSH 3.37 12/04/2020   INR 1.1 02/23/2021   HGBA1C 6.0 (H) 02/23/2021   MICROALBUR 0.8 12/04/2020    No results found.   Assessment & Plan:  Plan   No orders of the defined types were placed in this encounter.   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 Encouraged heart healthy diet such as the DASH diet and exercise as tolerated.        Hyperlipidemia    Encourage heart healthy diet such as MIND or DASH diet, increase exercise, avoid trans fats, simple carbohydrates and processed foods, consider a krill or fish or flaxseed oil cap daily.         Follow-up: Return in about 3 months (around 06/26/2021), or if symptoms worsen or fail to improve, for hypertension, hyperlipidemia.   I,Gordon Zheng,acting as a Neurosurgeon for [employer], DO.,have documented all relevant documentation on the behalf of [name], DO,as directed by  [name], DO while in the presence of [name], DO.  I, [name], DO, have reviewed all documentation for this visit. The documentation on 03/26/21 for the exam,  diagnosis, procedures, and orders are all accurate and complete.

## 2021-03-26 NOTE — Assessment & Plan Note (Signed)
Well controlled, no changes to meds. Encouraged heart healthy diet such as the DASH diet and exercise as tolerated.  °

## 2021-03-26 NOTE — Patient Instructions (Signed)
Stroke Prevention Some medical conditions and lifestyle choices can lead to a higher risk for a stroke. You can help to prevent a stroke by eating healthy foods and exercising. It also helps to not smoke and to manage any health problems youmay have. How can this condition affect me? A stroke is an emergency. It should be treated right away. A stroke can lead to brain damage or threaten your life. There is a better chance of surviving andgetting better after a stroke if you get medical help right away. What can increase my risk? The following medical conditions may increase your risk of a stroke: Diseases of the heart and blood vessels (cardiovascular disease). High blood pressure (hypertension). Diabetes. High cholesterol. Sickle cell disease. Problems with blood clotting. Being very overweight. Sleeping problems (obstructivesleep apnea). Other risk factors include: Being older than age 60. A history of blood clots, stroke, or mini-stroke (TIA). Race, ethnic background, or a family history of stroke. Smoking or using tobacco products. Taking birth control pills, especially if you smoke. Heavy alcohol and drug use. Not being active. What actions can I take to prevent this? Manage your health conditions High cholesterol. Eat a healthy diet. If this is not enough to manage your cholesterol, you may need to take medicines. Take medicines as told by your doctor. High blood pressure. Try to keep your blood pressure below 130/80. If your blood pressure cannot be managed through a healthy diet and regular exercise, you may need to take medicines. Take medicines as told by your doctor. Ask your doctor if you should check your blood pressure at home. Have your blood pressure checked every year. Diabetes. Eat a healthy diet and get regular exercise. If your blood sugar (glucose) cannot be managed through diet and exercise, you may need to take medicines. Take medicines as told by your  doctor. Talk to your doctor about getting checked for sleeping problems. Signs of a problem can include: Snoring a lot. Feeling very tired. Make sure that you manage any other conditions you have. Nutrition  Follow instructions from your doctor about what to eat or drink. You may be told to: Eat and drink fewer calories each day. Limit how much salt (sodium) you use to 1,500 milligrams (mg) each day. Use only healthy fats for cooking, such as olive oil, canola oil, and sunflower oil. Eat healthy foods. To do this: Choose foods that are high in fiber. These include whole grains, and fresh fruits and vegetables. Eat at least 5 servings of fruits and vegetables a day. Try to fill one-half of your plate with fruits and vegetables at each meal. Choose low-fat (lean) proteins. These include low-fat cuts of meat, chicken without skin, fish, tofu, beans, and nuts. Eat low-fat dairy products. Avoid foods that: Are high in salt. Have saturated fat. Have trans fat. Have cholesterol. Are processed or pre-made. Count how many carbohydrates you eat and drink each day.  Lifestyle If you drink alcohol: Limit how much you have to: 0-1 drink a day for women who are not pregnant. 0-2 drinks a day for men. Know how much alcohol is in your drink. In the [place] one drink equals one 12 oz bottle of beer (355mL), one 5 oz glass of wine (148mL), or one 1 oz glass of hard liquor (44mL). Do not smoke or use any products that have nicotine or tobacco. If you need help quitting, ask your doctor. Avoid secondhand smoke. Do not use drugs. Activity  Try to stay at a healthy   weight. Get at least 30 minutes of exercise on most days, such as: Fast walking. Biking. Swimming.  Medicines Take over-the-counter and prescription medicines only as told by your doctor. Avoid taking birth control pills. Talk to your doctor about the risks of taking birth control pills if: You are over 35 years old. You smoke. You  get very bad headaches. You have had a blood clot. Where to find more information American Stroke Association: www.strokeassociation.org Get help right away if: You or a loved one has any signs of a stroke. "BE FAST" is an easy way to remember the warning signs: B - Balance. Dizziness, sudden trouble walking, or loss of balance. E - Eyes. Trouble seeing or a change in how you see. F - Face. Sudden weakness or loss of feeling of the face. The face or eyelid may droop on one side. A - Arms. Weakness or loss of feeling in an arm. This happens all of a sudden and most often on one side of the body. S - Speech. Sudden trouble speaking, slurred speech, or trouble understanding what people say. T - Time. Time to call emergency services. Write down what time symptoms started. You or a loved one has other signs of a stroke, such as: A sudden, very bad headache with no known cause. Feeling like you may vomit (nausea). Vomiting. A seizure. These symptoms may be an emergency. Get help right away. Call your local emergency services (911 in the [place] Do not wait to see if the symptoms will go away. Do not drive yourself to the hospital. Summary You can help to prevent a stroke by eating healthy, exercising, and not smoking. It also helps to manage any health problems you have. Do not smoke or use any products that contain nicotine or tobacco. Get help right away if you or a loved one has any signs of a stroke. This information is not intended to replace advice given to you by your health care provider. Make sure you discuss any questions you have with your healthcare provider. Document Revised: 05/04/2020 Document Reviewed: 05/04/2020 Elsevier Patient Education  2022 Elsevier Inc.  

## 2021-03-26 NOTE — Assessment & Plan Note (Signed)
Encourage heart healthy diet such as MIND or DASH diet, increase exercise, avoid trans fats, simple carbohydrates and processed foods, consider a krill or fish or flaxseed oil cap daily.  °

## 2021-03-26 NOTE — Assessment & Plan Note (Signed)
Improving  Pt will f/u neuro

## 2021-03-26 NOTE — Telephone Encounter (Signed)
Approval given

## 2021-03-29 ENCOUNTER — Other Ambulatory Visit: Payer: Self-pay

## 2021-03-29 ENCOUNTER — Encounter: Payer: No Typology Code available for payment source | Attending: Registered Nurse | Admitting: Registered Nurse

## 2021-03-29 VITALS — BP 140/87 | HR 83 | Temp 98.3°F | Ht 61.0 in | Wt 124.8 lb

## 2021-03-29 DIAGNOSIS — I63441 Cerebral infarction due to embolism of right cerebellar artery: Secondary | ICD-10-CM | POA: Diagnosis present

## 2021-03-29 DIAGNOSIS — Z8673 Personal history of transient ischemic attack (TIA), and cerebral infarction without residual deficits: Secondary | ICD-10-CM | POA: Diagnosis present

## 2021-03-29 DIAGNOSIS — I10 Essential (primary) hypertension: Secondary | ICD-10-CM | POA: Diagnosis present

## 2021-03-29 DIAGNOSIS — H539 Unspecified visual disturbance: Secondary | ICD-10-CM

## 2021-03-29 DIAGNOSIS — I69398 Other sequelae of cerebral infarction: Secondary | ICD-10-CM

## 2021-03-29 NOTE — Progress Notes (Signed)
Subjective:    Patient ID: Judy Gutierrez, female    DOB: Feb 19, 1961, 60 y.o.   MRN: [id number]  HPI: Judy Gutierrez is a 60 y.o. female who is here for HFU appointment for follow up of her Embolic Stroke involving Right Cerebellar Artery, Visual Disturbance as complication of stroke, History of CVA and Essential Hypertension. [name] presented to [facility] on 02/22/2021 with complaints of dizziness, nausea and vomiting. Neurology consulted.  CT Head WO Contrast:  IMPRESSION: 1. Evolving hypodensity involving the superior right cerebellum/cerebellar vermis, concerning for an acute right SCA distribution infarct. No intracranial hemorrhage or mass effect. 2. ASPECTS is 10. 3. Underlying chronic left SCA distribution infarcts.   CT Angio Head/Neck IMPRESSION: 1. Focal intimal irregularity involving the distal right V3 segment, likely reflecting a short-segment dissection. No frank raised dissection flap or significant luminal narrowing. 2. 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 3. Mild for age atheromatous disease elsewhere about the major arterial vasculature of the head and neck. No other hemodynamically significant or correctable stenosis.  MR Brain WO Contrast:  IMPRESSION: 4-5 cm region of acute infarction within the superior cerebellum on the right. Punctate acute infarction in the right dorsal midbrain. Mild swelling. No evidence of acute hemorrhage.   Old infarction in the left superior cerebellum with atrophy, encephalomalacia and gliosis and hemosiderin deposition.  [name] is on low dose aspirin for CVA prophylaxis.   [name] was admitted to inpatient rehabilitation on 02/26/2021 and discharged home on 03/16/2021. She is receiving home health therapy with [facility]. She denies any pain. She rates her pain 0.    Pain Inventory Average Pain 0 Pain Right Now 0 My pain is  no pain  LOCATION OF PAIN  no  pain  BOWEL Number of stools per week: 7 Oral laxative use No  Type of laxative na Enema or suppository use No  History of colostomy No  Incontinent No   BLADDER Normal In and out cath, frequency na Able to self cath  na Bladder incontinence No  Frequent urination No  Leakage with coughing No  Difficulty starting stream No  Incomplete bladder emptying No    Mobility walk with assistance use a walker how many minutes can you walk? 10-15 ability to climb steps?  yes do you drive?  no use a wheelchair needs help with transfers  Function retired  Neuro/Psych trouble walking  Prior Studies TC appt  Physicians involved in your care TC appt   Family History  Problem Relation Age of Onset   Pulmonary embolism Mother    Hepatitis Father    Diabetes Father    Diabetes Mellitus II Sister    Breast cancer Neg Hx    Social History   Socioeconomic History   Marital status: Married    Spouse name: Not on file   Number of children: 1   Years of education: Not on file   Highest education level: Not on file  Occupational History   Occupation: convenience store    Comment: self employed  Tobacco Use   Smoking status: Never   Smokeless tobacco: Never  Substance and Sexual Activity   Alcohol use: No   Drug use: No   Sexual activity: Yes  Other Topics Concern   Not on file  Social History Narrative   Lives with husband   Right Handed   Drinks caffeine seldom   Social Determinants of Health   Financial Resource Strain: Not on  file  Food Insecurity: Not on file  Transportation Needs: Not on file  Physical Activity: Not on file  Stress: Not on file  Social Connections: Not on file   Past Surgical History:  Procedure Laterality Date   BUBBLE STUDY  03/05/2020   Procedure: BUBBLE STUDY;  Surgeon: [name], MD;  Location: [facility] ENDOSCOPY;  Service: Cardiovascular;;   CESAREAN SECTION  1992   LOOP RECORDER INSERTION N/A 03/05/2020   Procedure:  LOOP RECORDER INSERTION;  Surgeon: [name], MD;  Location: [facility] INVASIVE CV LAB;  Service: Cardiovascular;  Laterality: N/A;   TEE WITHOUT CARDIOVERSION N/A 03/05/2020   Procedure: TRANSESOPHAGEAL ECHOCARDIOGRAM (TEE);  Surgeon: [name], MD;  Location: [facility] ENDOSCOPY;  Service: Cardiovascular;  Laterality: N/A;   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)    Stroke (HCC)    Unspecified essential hypertension    BP 140/87 (BP Location: Right Arm, Patient Position: Sitting, Cuff Size: Normal)   Pulse 83   Temp 98.3 F (36.8 C) (Oral)   Ht 5\' 1"  (1.549 m)   Wt 124 lb 12.8 oz (56.6 kg)   LMP 12/08/2010   SpO2 98%   BMI 23.58 kg/m   Opioid Risk Score:   Fall Risk Score:  `1  Depression screen PHQ 2/9  Depression screen [facility] 2/9 03/29/2021 12/04/2020 06/28/2018 10/13/2016 09/04/2015 04/10/2013  Decreased Interest 0 0 2 0 0 0  Down, Depressed, Hopeless 0 0 0 0 0 0  PHQ - 2 Score 0 0 2 0 0 0  Altered sleeping 0 - 0 - - -  Tired, decreased energy 1 - 0 - - -  Change in appetite 0 - 0 - - -  Feeling bad or failure about yourself  0 - 0 - - -  Trouble concentrating 0 - 0 - - -  Moving slowly or fidgety/restless 0 - 0 - - -  Suicidal thoughts 0 - 0 - - -  PHQ-9 Score 1 - 2 - - -  Difficult doing work/chores Somewhat difficult - Not difficult at all - - -     Review of Systems  Musculoskeletal:  Positive for gait problem.  All other systems reviewed and are negative.     Objective:   Physical Exam Vitals and nursing note reviewed.  Constitutional:      Appearance: Normal appearance.  Cardiovascular:     Rate and Rhythm: Normal rate and regular rhythm.     Pulses: Normal pulses.     Heart sounds: Normal heart sounds.  Pulmonary:     Effort: Pulmonary effort is normal.     Breath sounds: Normal breath sounds.   Musculoskeletal:     Cervical back: Normal range of motion and neck supple.     Comments: Normal Muscle Bulk and Muscle Testing Reveals:  Upper Extremities: Full ROM and Muscle Strength on Right 3/5 and Left 4/ Lower Extremities: Full ROM and Muscle Strength 5/5 Arrived in wheelchair     Skin:    General: Skin is warm and dry.  Neurological:     Mental Status: She is alert and oriented to person, place, and time.  Psychiatric:        Mood and Affect: Mood normal.        Behavior: Behavior normal.         Assessment &  Plan:  Embolic Stroke involving Right Cerebellar Artery:  Visual Disturbance as complication of stroke: History of CVA: Call was Placed to [facility] Neurology, she has a scheduled  appointment. Continue Home Health Therapy with [facility].  2. Essential Hypertension: Continue current medication regimen. PCP following.   F/U in 4- 6 weeks with Dr Wynn Banker

## 2021-03-31 ENCOUNTER — Telehealth: Payer: Self-pay | Admitting: Neurology

## 2021-03-31 NOTE — Telephone Encounter (Signed)
Pt's husband called wanting to know if they can get a letter for disability. Please advise.

## 2021-04-01 ENCOUNTER — Encounter: Payer: Self-pay | Admitting: Registered Nurse

## 2021-04-01 ENCOUNTER — Telehealth: Payer: Self-pay

## 2021-04-01 NOTE — Telephone Encounter (Signed)
Theora Gianotti, SP from [facility] called requesting verbal order HHST 1wk1, 0wk1, 1wk5. Approval given.

## 2021-04-08 NOTE — Progress Notes (Signed)
Carelink Summary Report / Loop Recorder 

## 2021-04-16 ENCOUNTER — Ambulatory Visit (INDEPENDENT_AMBULATORY_CARE_PROVIDER_SITE_OTHER): Payer: No Typology Code available for payment source

## 2021-04-16 DIAGNOSIS — I639 Cerebral infarction, unspecified: Secondary | ICD-10-CM | POA: Diagnosis not present

## 2021-04-19 LAB — CUP PACEART REMOTE DEVICE CHECK
Date Time Interrogation Session: 20220629230426
Implantable Pulse Generator Implant Date: 20210520

## 2021-04-20 ENCOUNTER — Telehealth: Payer: Self-pay | Admitting: *Deleted

## 2021-04-20 NOTE — Telephone Encounter (Signed)
Tresa Endo PT called to get verbal ok to extend PT 1wk4.  Approval given.

## 2021-04-30 ENCOUNTER — Telehealth: Payer: Self-pay

## 2021-04-30 NOTE — Telephone Encounter (Signed)
Pt wants to know if she can drink protein shakes for energy.  Please advise.

## 2021-05-03 NOTE — Telephone Encounter (Signed)
Pt called. Left detailed vm 

## 2021-05-04 NOTE — Telephone Encounter (Signed)
Who Is Calling Patient / Member / Family / Caregiver Caller Name Wyolene Weimann Phone Number [phone] Call Type Message Only Information Provided Reason for Call Returning a Call from the Office Initial Comment Caller states that she missed a call from the office. Additional Comment Caller would like another call back to know what the missed call was regarding. She is a current patient and accidentally missed the first call. Disp. Time Disposition Final User 05/03/2021 5:13:06 PM General Information Provided Yes Cherylynn Ridges

## 2021-05-05 NOTE — Telephone Encounter (Signed)
Spoke with patient. Pt verbalized understanding  °

## 2021-05-05 NOTE — Progress Notes (Signed)
Carelink Summary Report / Loop Recorder 

## 2021-05-07 ENCOUNTER — Other Ambulatory Visit: Payer: Self-pay

## 2021-05-07 ENCOUNTER — Encounter: Payer: Self-pay | Admitting: Physical Medicine & Rehabilitation

## 2021-05-07 ENCOUNTER — Encounter
Payer: No Typology Code available for payment source | Attending: Registered Nurse | Admitting: Physical Medicine & Rehabilitation

## 2021-05-07 VITALS — BP 134/81 | HR 81 | Temp 98.7°F | Ht 62.0 in | Wt 123.0 lb

## 2021-05-07 DIAGNOSIS — I639 Cerebral infarction, unspecified: Secondary | ICD-10-CM

## 2021-05-07 NOTE — Progress Notes (Signed)
Subjective:    Patient ID: Judy Gutierrez, female    DOB: 10/23/1960, 60 y.o.   MRN: [id number]  HPI Mrs. Neuenfeldt is a 60 year old female who suffered a left cerebellar infarct in 2021, who on 03/03/2021 suffered a large right cerebellar infarct PICA territory L as well as a partially hemorrhagic superior cerebellar artery stroke Patient has completed inpatient rehabilitation at [facility] health Admit date: 02/26/2021 Discharge date: 03/16/2021  She has been discharged to home and receives help from her husband.  She continues to use a walker for ambulation. HHPT, OT,SLP  EXAM: MRI HEAD WITHOUT CONTRAST   TECHNIQUE: Multiplanar, multiecho pulse sequences of the brain and surrounding structures were obtained without intravenous contrast.   COMPARISON:  Head CT Mar 03, 2021.   FINDINGS: Brain: Large area of restricted diffusion involving the inferior right cerebellar hemisphere consistent with acute/subacute right PICA territory infarct. Patchy T1 hyperintensity with prominent susceptibility artifact in the superior aspect of the right cerebellar hemisphere with surrounding vasogenic edema is suggestive of hemorrhagic transformation of non prior right SCA territory infarct. Edema and mass effect from these 2 areas of infarcts resulting in leftward midline shift with mass effect on the fourth ventricle which remains patent as well as mass effect on the posterior aspect of the pons and mid brain with mild periaqueductal edema and small ascending transtentorial herniation. No tonsillar herniation. No hydrocephalus. Overall, findings are similar to prior CT performed earlier today   Area of encephalomalacia and gliosis with hemosiderin staining in the superior left cerebellar hemisphere consistent with chronic left SCA territory infarct.   Vascular: Normal flow voids.   Skull and upper cervical spine: Normal marrow signal.   Sinuses/Orbits: Mucosal thickening with bubbly secretion in the  left maxillary and sphenoid sinuses. The orbits are maintained.   Other: Right mastoid effusion.   IMPRESSION: Large acute/subacute right PICA territory infarct and late subacute right SCA territory infarct with superimposed hemorrhagic transformation. Edema from of right cerebellar hemisphere infarcts results in leftward midline shift with mass effect on the fourth ventricle, mid brain and pons without evidence of hydrocephalus. Findings appear stable compared to head CT performed earlier today.     Electronically Signed   By: Baldemar Lenis M.D.   On: 03/03/2021 13:13 Pain Inventory Average Pain 0 Pain Right Now 0 My pain is  none  LOCATION OF PAIN  none  BOWEL Number of stools per week: 4   BLADDER Normal   Mobility walk with assistance use a cane  Function not employed: date last employed .  Neuro/Psych trouble walking  Prior Studies Any changes since last visit?  no  Physicians involved in your care Any changes since last visit?  no   Family History  Problem Relation Age of Onset   Pulmonary embolism Mother    Hepatitis Father    Diabetes Father    Diabetes Mellitus II Sister    Breast cancer Neg Hx    Social History   Socioeconomic History   Marital status: Married    Spouse name: Not on file   Number of children: 1   Years of education: Not on file   Highest education level: Not on file  Occupational History   Occupation: convenience store    Comment: self employed  Tobacco Use   Smoking status: Never   Smokeless tobacco: Never  Substance and Sexual Activity   Alcohol use: No   Drug use: No   Sexual activity: Yes  Other Topics Concern  Not on file  Social History Narrative   Lives with husband   Right Handed   Drinks caffeine seldom   Social Determinants of Health   Financial Resource Strain: Not on file  Food Insecurity: Not on file  Transportation Needs: Not on file  Physical Activity: Not on file   Stress: Not on file  Social Connections: Not on file   Past Surgical History:  Procedure Laterality Date   BUBBLE STUDY  03/05/2020   Procedure: BUBBLE STUDY;  Surgeon: [name], MD;  Location: [facility] ENDOSCOPY;  Service: Cardiovascular;;   CESAREAN SECTION  1992   LOOP RECORDER INSERTION N/A 03/05/2020   Procedure: LOOP RECORDER INSERTION;  Surgeon: [name], MD;  Location[facility] INVASIVE CV LAB;  Service: Cardiovascular;  Laterality: N/A;   TEE WITHOUT CARDIOVERSION N/A 03/05/2020   Procedure: TRANSESOPHAGEAL ECHOCARDIOGRAM (TEE);  Surgeon: [name], MD;  Location: [facility] ENDOSCOPY;  Service: Cardiovascular;  Laterality: N/A;   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)    Stroke [facility])    Unspecified essential hypertension    BP 134/81   Pulse 81   Temp 98.7 F (37.1 C)   Ht 5\' 2"  (1.575 m)   Wt 123 lb (55.8 kg)   LMP 12/08/2010   SpO2 98%   BMI 22.50 kg/m   Opioid Risk Score:   Fall Risk Score:  `1  Depression screen PHQ 2/9  Depression screen [facility] 2/9 03/29/2021 12/04/2020 06/28/2018 10/13/2016 09/04/2015 04/10/2013  Decreased Interest 0 0 2 0 0 0  Down, Depressed, Hopeless 0 0 0 0 0 0  PHQ - 2 Score 0 0 2 0 0 0  Altered sleeping 0 - 0 - - -  Tired, decreased energy 1 - 0 - - -  Change in appetite 0 - 0 - - -  Feeling bad or failure about yourself  0 - 0 - - -  Trouble concentrating 0 - 0 - - -  Moving slowly or fidgety/restless 0 - 0 - - -  Suicidal thoughts 0 - 0 - - -  PHQ-9 Score 1 - 2 - - -  Difficult doing work/chores Somewhat difficult - Not difficult at all - - -    Review of Systems  Constitutional: Negative.   HENT: Negative.    Eyes: Negative.   Respiratory: Negative.    Cardiovascular: Negative.   Gastrointestinal: Negative.   Endocrine: Negative.   Genitourinary: Negative.    Musculoskeletal:  Positive for gait problem.  Skin: Negative.   Allergic/Immunologic: Negative.   Hematological: Negative.   Psychiatric/Behavioral: Negative.    All other systems reviewed and are negative.     Objective:   Physical Exam  Mild dysmetria right finger-nose-finger as well as right heel-to-shin No dysmetria left finger-nose-finger or left heel-to-shin Decreased fine motor finger thumb opposition right upper extremity Positive dysdiadochokinesis right upper extremity supination pronation Motor strength is 5/5 bilateral deltoid bicep tricep grip hip flexor knee extensor ankle dorsiflexor Sensation intact bilaterally Speech without evidence of dysarthria or aphasia although patient feels like her speech is not quite right yet. Ambulates with a walker no evidence of toe drag or knee instability      Assessment & Plan:  1.  Right cerebellar infarcts PICA greater than SCA Has residual gait disorder reduced truncal stability as well as mild dysmetria  and fine motor deficits. Overall making a very good recovery.  We will transition from home health PT OT speech to outpatient I will see her back in 6 weeks Follow-up with neurology next week Follow-up PCP

## 2021-05-09 ENCOUNTER — Encounter (HOSPITAL_BASED_OUTPATIENT_CLINIC_OR_DEPARTMENT_OTHER): Payer: Self-pay | Admitting: *Deleted

## 2021-05-09 ENCOUNTER — Observation Stay (HOSPITAL_BASED_OUTPATIENT_CLINIC_OR_DEPARTMENT_OTHER)
Admission: EM | Admit: 2021-05-09 | Discharge: 2021-05-11 | Disposition: A | Discharge status: Home / Self Care | Payer: No Typology Code available for payment source | Attending: Family Medicine | Admitting: Family Medicine

## 2021-05-09 ENCOUNTER — Emergency Department (HOSPITAL_BASED_OUTPATIENT_CLINIC_OR_DEPARTMENT_OTHER): Payer: No Typology Code available for payment source

## 2021-05-09 ENCOUNTER — Other Ambulatory Visit: Payer: Self-pay

## 2021-05-09 DIAGNOSIS — Z20822 Contact with and (suspected) exposure to covid-19: Secondary | ICD-10-CM | POA: Insufficient documentation

## 2021-05-09 DIAGNOSIS — E785 Hyperlipidemia, unspecified: Secondary | ICD-10-CM | POA: Diagnosis not present

## 2021-05-09 DIAGNOSIS — R7303 Prediabetes: Secondary | ICD-10-CM | POA: Diagnosis not present

## 2021-05-09 DIAGNOSIS — I110 Hypertensive heart disease with heart failure: Secondary | ICD-10-CM | POA: Diagnosis not present

## 2021-05-09 DIAGNOSIS — G459 Transient cerebral ischemic attack, unspecified: Principal | ICD-10-CM | POA: Diagnosis present

## 2021-05-09 DIAGNOSIS — Z7982 Long term (current) use of aspirin: Secondary | ICD-10-CM | POA: Insufficient documentation

## 2021-05-09 DIAGNOSIS — Y900 Blood alcohol level of less than 20 mg/100 ml: Secondary | ICD-10-CM | POA: Insufficient documentation

## 2021-05-09 DIAGNOSIS — Z79899 Other long term (current) drug therapy: Secondary | ICD-10-CM | POA: Diagnosis not present

## 2021-05-09 DIAGNOSIS — I10 Essential (primary) hypertension: Secondary | ICD-10-CM | POA: Diagnosis not present

## 2021-05-09 DIAGNOSIS — R2681 Unsteadiness on feet: Secondary | ICD-10-CM | POA: Diagnosis not present

## 2021-05-09 DIAGNOSIS — M6281 Muscle weakness (generalized): Secondary | ICD-10-CM | POA: Insufficient documentation

## 2021-05-09 DIAGNOSIS — R42 Dizziness and giddiness: Secondary | ICD-10-CM | POA: Diagnosis present

## 2021-05-09 DIAGNOSIS — I5032 Chronic diastolic (congestive) heart failure: Secondary | ICD-10-CM | POA: Diagnosis not present

## 2021-05-09 LAB — PROTIME-INR
INR: 1 (ref 0.8–1.2)
Prothrombin Time: 12.9 seconds (ref 11.4–15.2)

## 2021-05-09 LAB — URINALYSIS, ROUTINE W REFLEX MICROSCOPIC
Bilirubin Urine: NEGATIVE
Glucose, UA: NEGATIVE mg/dL
Hgb urine dipstick: NEGATIVE
Ketones, ur: NEGATIVE mg/dL
Nitrite: NEGATIVE
Protein, ur: NEGATIVE mg/dL
Specific Gravity, Urine: 1.01 (ref 1.005–1.030)
pH: 7.5 (ref 5.0–8.0)

## 2021-05-09 LAB — COMPREHENSIVE METABOLIC PANEL
ALT: 37 U/L (ref 0–44)
AST: 38 U/L (ref 15–41)
Albumin: 3.9 g/dL (ref 3.5–5.0)
Alkaline Phosphatase: 79 U/L (ref 38–126)
Anion gap: 7 (ref 5–15)
BUN: 16 mg/dL (ref 6–20)
CO2: 28 mmol/L (ref 22–32)
Calcium: 9 mg/dL (ref 8.9–10.3)
Chloride: 100 mmol/L (ref 98–111)
Creatinine, Ser: 0.72 mg/dL (ref 0.44–1.00)
GFR, Estimated: >60 mL/min (ref >60)
Glucose, Bld: 140 mg/dL — ABNORMAL HIGH (ref 70–99)
Potassium: 3.9 mmol/L (ref 3.5–5.1)
Sodium: 135 mmol/L (ref 135–145)
Total Bilirubin: 1 mg/dL (ref 0.3–1.2)
Total Protein: 7.9 g/dL (ref 6.5–8.1)

## 2021-05-09 LAB — CBC
HCT: 36.5 % (ref 36.0–46.0)
Hemoglobin: 12.4 g/dL (ref 12.0–15.0)
MCH: 30.4 pg (ref 26.0–34.0)
MCHC: 34 g/dL (ref 30.0–36.0)
MCV: 89.5 fL (ref 80.0–100.0)
Platelets: 235 10*3/uL (ref 150–400)
RBC: 4.08 MIL/uL (ref 3.87–5.11)
RDW: 12.5 % (ref 11.5–15.5)
WBC: 8.4 10*3/uL (ref 4.0–10.5)
nRBC: 0 % (ref 0.0–0.2)

## 2021-05-09 LAB — URINALYSIS, MICROSCOPIC (REFLEX): RBC / HPF: NONE SEEN RBC/hpf (ref 0–5)

## 2021-05-09 LAB — DIFFERENTIAL
Abs Immature Granulocytes: 0.01 10*3/uL (ref 0.00–0.07)
Basophils Absolute: 0 10*3/uL (ref 0.0–0.1)
Basophils Relative: 0 %
Eosinophils Absolute: 0.2 10*3/uL (ref 0.0–0.5)
Eosinophils Relative: 2 %
Immature Granulocytes: 0 %
Lymphocytes Relative: 34 %
Lymphs Abs: 2.9 10*3/uL (ref 0.7–4.0)
Monocytes Absolute: 0.5 10*3/uL (ref 0.1–1.0)
Monocytes Relative: 6 %
Neutro Abs: 4.9 10*3/uL (ref 1.7–7.7)
Neutrophils Relative %: 58 %

## 2021-05-09 LAB — ETHANOL: Alcohol, Ethyl (B): <10 mg/dL (ref <10)

## 2021-05-09 LAB — RAPID URINE DRUG SCREEN, HOSP PERFORMED
Amphetamines: NOT DETECTED
Barbiturates: NOT DETECTED
Benzodiazepines: NOT DETECTED
Cocaine: NOT DETECTED
Opiates: NOT DETECTED
Tetrahydrocannabinol: NOT DETECTED

## 2021-05-09 LAB — APTT: aPTT: 28 seconds (ref 24–36)

## 2021-05-09 LAB — RESP PANEL BY RT-PCR (FLU A&B, COVID) ARPGX2
Influenza A by PCR: NEGATIVE
Influenza B by PCR: NEGATIVE
SARS Coronavirus 2 by RT PCR: NEGATIVE

## 2021-05-09 LAB — PREGNANCY, URINE: Preg Test, Ur: NEGATIVE

## 2021-05-09 LAB — CBG MONITORING, ED: Glucose-Capillary: 117 mg/dL — ABNORMAL HIGH (ref 70–99)

## 2021-05-09 MED ORDER — STROKE: EARLY STAGES OF RECOVERY BOOK
Freq: Once | Status: AC
  Filled 2021-05-09: qty 1

## 2021-05-09 MED ORDER — IOHEXOL 350 MG/ML SOLN
100.0000 mL | Freq: Once | INTRAVENOUS | Status: AC | PRN
  Administered 2021-05-09: 80 mL via INTRAVENOUS

## 2021-05-09 MED ORDER — ONDANSETRON HCL 4 MG PO TABS: 4.0000 mg | ORAL_TABLET | Freq: Four times a day (QID) | ORAL | Status: DC | PRN

## 2021-05-09 MED ORDER — ONDANSETRON HCL 4 MG/2ML IJ SOLN: 4.0000 mg | Freq: Four times a day (QID) | INTRAMUSCULAR | Status: DC | PRN

## 2021-05-09 MED ORDER — ACETAMINOPHEN 325 MG PO TABS: 650.0000 mg | ORAL_TABLET | Freq: Four times a day (QID) | ORAL | Status: DC | PRN

## 2021-05-09 MED ORDER — ACETAMINOPHEN 650 MG RE SUPP: 650.0000 mg | Freq: Four times a day (QID) | RECTAL | Status: DC | PRN

## 2021-05-09 NOTE — ED Notes (Signed)
Covid Swab obtained 

## 2021-05-09 NOTE — ED Notes (Signed)
AMbulatory to bathroom without difficulty. All symptoms resolved.

## 2021-05-09 NOTE — ED Triage Notes (Signed)
States bent over and became dizzy, left hand feels "tingling" left side of face feels same BEFAST appears normal, speech is the same, no difference in speech today. MAE x 4, strong leg strength and strong grip strength bilaterally, VAN Negative,

## 2021-05-09 NOTE — ED Notes (Signed)
States dizziness has now resolved

## 2021-05-09 NOTE — ED Notes (Signed)
Transported to CT via stretcher with Casimiro Needle, RN

## 2021-05-09 NOTE — ED Notes (Signed)
Pt states she has double vision at this time, immediately placed on cardiac monitor with cont POX monitoring and int NBP assessments.

## 2021-05-09 NOTE — ED Notes (Signed)
Attempted to call report to floor, RN to call back. 

## 2021-05-09 NOTE — ED Provider Notes (Signed)
[facility] EMERGENCY DEPARTMENT Provider Note   CSN: [id number] Arrival date & time: 05/09/21  1643     History Chief Complaint  Patient presents with   Dizziness    Judy Gutierrez is a 60 y.o. female.  The history is provided by the patient, the spouse and medical records.  Dizziness Judy Gutierrez is a 60 y.o. female who presents to the Emergency Department complaining of dizziness. She presents the emergency department accompanied by her husband for evaluation of dizziness and concern for stroke. She was last known well at 1630 today. She was performing exercises at home due to her history of stroke and went to bed down and abruptly developed dizziness and double vision onlooking to the right. Symptoms are constant in nature. Husband also states her speech seems slightly slower than baseline.    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)    Stroke [facility])    Unspecified essential hypertension     Patient Active Problem List   Diagnosis Date Noted   TIA (transient ischemic attack) 05/09/2021   Chronic diastolic heart failure (grade 1 DD) 05/09/2021   Primary hypertension    Obstructive hydrocephalus (HCC)    Tachycardia    Acute cerebral infarction [facility])    Visual disturbance as complication of stroke    Transaminitis    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 coli UTI 11/01/2013   Anemia due to blood loss, acute 10/24/2013   Pelvic fracture (HCC) 10/23/2013   Hyperglycemia 09/18/2013   Routine general medical examination at a health care facility 09/18/2013   Elevated alkaline phosphatase level 09/18/2013   Hot flashes, menopausal 04/10/2013   Pain in joint, upper arm 04/10/2013    Past Surgical History:  Procedure Laterality Date   BUBBLE STUDY  03/05/2020   Procedure: BUBBLE STUDY;  Surgeon: [name], MD;  Location: [facility] ENDOSCOPY;  Service: Cardiovascular;;   CESAREAN SECTION  1992   LOOP RECORDER INSERTION N/A 03/05/2020   Procedure: LOOP RECORDER INSERTION;  Surgeon: [name], MD;  Location: [facility] INVASIVE CV LAB;  Service: Cardiovascular;  Laterality: N/A;   TEE WITHOUT CARDIOVERSION N/A 03/05/2020   Procedure: TRANSESOPHAGEAL ECHOCARDIOGRAM (TEE);  Surgeon: [name], MD;  Location: [facility] ENDOSCOPY;  Service: Cardiovascular;  Laterality: N/A;     OB History   No obstetric history on file.     Family History  Problem Relation Age of Onset   Pulmonary embolism Mother    Hepatitis Father    Diabetes Father    Diabetes Mellitus II Sister    Breast cancer Neg Hx     Social History   Tobacco Use   Smoking status: Never   Smokeless tobacco: Never  Substance Use Topics   Alcohol use: No   Drug use: No    Home Medications  Prior to Admission medications   Medication Sig Start Date End Date Taking? Authorizing Provider  amLODipine (NORVASC) 5 MG tablet Take 1 tablet (5 mg total) by mouth daily. 12/04/20   [name], DO  aspirin 81 MG EC tablet Take 1 tablet (81 mg total) by mouth daily. 02/28/20   [name], DO  atorvastatin (LIPITOR) 40 MG tablet Take 2 tablets (80 mg total) by mouth daily. 03/16/21   Love, Evlyn Kanner, PA-C  Calcium Carbonate-Vit D-Min (CALCIUM 1200 PO) Take by mouth.    [provider]  Ferrous  Sulfate (IRON PO) Take by mouth.    [provider]  Multiple Vitamin (MULTIVITAMIN WITH MINERALS) TABS tablet Take 1 tablet by mouth daily.    [provider]  polyethylene glycol (MIRALAX / GLYCOLAX) 17 g packet Take 17 g by mouth daily as needed. For constipation--available over the counter. 03/16/21   Jacquelynn Cree, PA-C    Allergies    Patient has no known allergies.  Review of Systems   Review of Systems  Neurological:  Positive for dizziness.  All other systems reviewed and are negative.  Physical Exam Updated Vital Signs BP 115/64 (BP Location: Left Arm)   Pulse 77   Temp 98 F (36.7 C) (Oral)   Resp 18   Ht 5\' 2"  (1.575 m)   Wt 57.6 kg   LMP 12/08/2010   SpO2 97%   BMI 23.23 kg/m   Physical Exam Vitals and nursing note reviewed.  Constitutional:      Appearance: She is well-developed.  HENT:     Head: Normocephalic and atraumatic.  Cardiovascular:     Rate and Rhythm: Normal rate and regular rhythm.     Heart sounds: No murmur heard. Pulmonary:     Effort: Pulmonary effort is normal. No respiratory distress.     Breath sounds: Normal breath sounds.  Abdominal:     Palpations: Abdomen is soft.     Tenderness: There is no abdominal tenderness. There is no guarding or rebound.  Musculoskeletal:        General: No tenderness.  Skin:    General: Skin is warm and dry.  Neurological:     Mental Status: She is alert.     Comments: Pupils equal round and reactive. Right eye cannot gaze laterally. Otherwise extraocular movements intact. There is significant dysmetria on finger to nose with the right upper extremity. Five out of five strength in all four extremities. No pronated or drift. Slow but fluent speech.  Psychiatric:        Behavior: Behavior normal.    ED Results / Procedures / Treatments   Labs (all labs ordered are listed, but only abnormal results are displayed) Labs Reviewed  COMPREHENSIVE METABOLIC PANEL - Abnormal; Notable for  the following components:      Result Value   Glucose, Bld 140 (*)    All other components within normal limits  URINALYSIS, ROUTINE W REFLEX MICROSCOPIC - Abnormal; Notable for the following components:   Color, Urine STRAW (*)    Leukocytes,Ua SMALL (*)    All other components within normal limits  URINALYSIS, MICROSCOPIC (REFLEX) - Abnormal; Notable for the following components:   Bacteria, UA FEW (*)    All other components within normal limits  CBG MONITORING, ED - Abnormal; Notable for the following components:   Glucose-Capillary 117 (*)    All other components within normal limits  RESP PANEL BY RT-PCR (FLU A&B, COVID) ARPGX2  ETHANOL  PROTIME-INR  APTT  CBC  DIFFERENTIAL  RAPID URINE DRUG SCREEN, HOSP PERFORMED  PREGNANCY, URINE  HEMOGLOBIN A1C  LIPID PANEL  HIV ANTIBODY (ROUTINE TESTING W REFLEX)    EKG None  Radiology CT ANGIO HEAD NECK W WO CM  Result Date: 05/09/2021 CLINICAL DATA:  Dizziness and left facial tingling EXAM: CT ANGIOGRAPHY HEAD AND NECK TECHNIQUE: Multidetector CT imaging of the head and neck was performed using the standard protocol during bolus administration of intravenous contrast. Multiplanar CT image reconstructions and MIPs were obtained to evaluate the vascular anatomy. Carotid stenosis measurements (when applicable) are obtained utilizing NASCET criteria, using the distal internal carotid diameter as the denominator. CONTRAST:  80mL OMNIPAQUE IOHEXOL 350 MG/ML SOLN COMPARISON:  None. FINDINGS: CTA NECK FINDINGS SKELETON: There is no bony spinal canal stenosis. No lytic or blastic lesion. OTHER NECK: Normal pharynx, larynx and major salivary glands. No cervical lymphadenopathy. Unremarkable thyroid gland. UPPER CHEST: No pneumothorax or pleural effusion. No nodules or masses. AORTIC ARCH: There is no calcific atherosclerosis of the aortic arch. There is no aneurysm, dissection or hemodynamically significant stenosis of the visualized portion of the  aorta. Conventional 3 vessel aortic branching pattern. The visualized proximal subclavian arteries are widely patent. RIGHT CAROTID SYSTEM: Normal without aneurysm, dissection or stenosis. LEFT CAROTID SYSTEM: Normal without aneurysm, dissection or stenosis. VERTEBRAL ARTERIES: Left dominant configuration. Both origins are clearly patent. There is no dissection, occlusion or flow-limiting stenosis to the skull base (V1-V3 segments). CTA HEAD FINDINGS POSTERIOR CIRCULATION: --Vertebral arteries: Normal V4 segments. --Inferior cerebellar arteries: Normal. --Basilar artery: Normal. --Superior cerebellar arteries: Normal. --Posterior cerebral arteries (PCA): Normal. ANTERIOR CIRCULATION: --Intracranial internal carotid arteries: Normal. --Anterior cerebral arteries (ACA): Normal. Both A1 segments are present. Patent anterior communicating artery (a-comm). --Middle cerebral arteries (MCA): Normal. VENOUS SINUSES: As permitted by contrast timing, patent. ANATOMIC VARIANTS: Both posterior communicating arteries are patent. There is fetal origin of the right PCA. Review of the MIP images confirms the above findings. IMPRESSION: Normal CTA of the head and neck. Electronically Signed   By: Deatra RobinsonKevin  Herman M.D.   On: 05/09/2021 20:16   CT HEAD CODE STROKE WO CONTRAST  Result Date: 05/09/2021 CLINICAL DATA:  Code stroke. Neuro deficit, acute, stroke suspected. Diplopia, vertigo, last known well 1630. Slurred speech. EXAM: CT HEAD WITHOUT CONTRAST TECHNIQUE: Contiguous axial images were obtained from the base of the skull through the vertex without intravenous contrast. COMPARISON:  03/15/2021 FINDINGS: Brain: No acute infarct, mass, midline shift, or extra-axial fluid collection is identified. The lateral and third ventricles are normal in size. The right cerebellar hemorrhage on the prior study has resolved, and there is now extensive encephalomalacia throughout the right cerebellar hemisphere with encephalomalacia also  noted in the vermis and superomedial left cerebellar hemisphere. There is ex vacuo dilatation of the fourth ventricle. Vascular: No hyperdense vessel. Skull: No fracture or suspicious osseous lesion. Sinuses/Orbits: Partially visualized mucosal thickening and secretions in the left maxillary and left sphenoid sinuses. Clear mastoid air cells. Unremarkable orbits. Other: None. ASPECTS [facility]([place] Stroke Program Early CT Score) - Ganglionic level infarction (caudate, lentiform nuclei, internal capsule, insula, M1-M3 cortex): 7 - Supraganglionic infarction (M4-M6 cortex): 3 Total score (0-10 with 10 being normal): 10 IMPRESSION: 1. No evidence of acute intracranial abnormality. ASPECTS of 10. 2. Expected evolution of hemorrhagic cerebellar infarct with resolved hemorrhage and development of extensive encephalomalacia. These results were called by telephone at the time of interpretation on 05/09/2021 at 5:36 pm to Dr. Tilden FossaElizabeth Clyde Zarrella, who  verbally acknowledged these results. Electronically Signed   By: Sebastian Ache M.D.   On: 05/09/2021 17:37    Procedures Procedures   Medications Ordered in ED Medications   stroke: mapping our early stages of recovery book (has no administration in time range)  acetaminophen (TYLENOL) tablet 650 mg (has no administration in time range)    Or  acetaminophen (TYLENOL) suppository 650 mg (has no administration in time range)  ondansetron (ZOFRAN) tablet 4 mg (has no administration in time range)    Or  ondansetron (ZOFRAN) injection 4 mg (has no administration in time range)  iohexol (OMNIPAQUE) 350 MG/ML injection 100 mL (80 mLs Intravenous Contrast Given 05/09/21 1955)    ED Course  I have reviewed the triage vital signs and the nursing notes.  Pertinent labs & imaging results that were available during my care of the patient were reviewed by me and considered in my medical decision making (see chart for details).    MDM Rules/Calculators/A&P                           patient here for evaluation of dizziness and double vision that started while bending over today. On ED evaluation patient with ataxia in the right upper extremity as well as diplopia on lateral gaze to the right. Code stroke was activated on ED arrival. She was evaluated by neurology with rapid resolution of her symptoms, more consistent with a TIA. On reassessment patient symptoms are resolved. She is not a TPA candidate. Discussed with neurologist, Dr. Amada Jupiter who recommends admission to [facility] for TIA workup and MRI. Patient and husband updated findings of studies and recommended plan.   Final Clinical Impression(s) / ED Diagnoses Final diagnoses:  TIA (transient ischemic attack)    Rx / DC Orders ED Discharge Orders     None        Tilden Fossa, MD 05/10/21 [phone]

## 2021-05-09 NOTE — ED Notes (Signed)
When arriving in CT Scan, speech noted be more slurred.

## 2021-05-09 NOTE — H&P (Signed)
History and Physical    Judy Gutierrez VCB:[id number] DOB: 12-30-1960 DOA: 05/09/2021  PCP: [name], DO   Patient coming from: Home.  I have personally briefly reviewed patient's old medical records in [facility] Link  Chief Complaint: Left-sided weakness.  HPI: Judy Gutierrez is a 60 y.o. 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  She went immediately to the emergency department.  A code stroke was called.  CT head was negative.  The patient stated that her symptoms subsequently disappeared about 30 minutes later and she is back to her baseline now.  She denies headache, blurred vision, nausea or emesis.  No fever, chills, sore throat, rhinorrhea, wheezing, hemoptysis, dyspnea, chest pain, palpitations, diaphoresis, PND, orthopnea, pitting edema lower extremities.  She denies abdominal pain, diarrhea, constipation, melena or hematochezia.  No dysuria, frequency or hematuria.  No polyuria, polydipsia, polyphagia or blurred vision.  ED Course: Initial vital signs were temperature 98.3 F, pulse 100, respiration 16, BP 159/98 mmHg and O2 sat 100% on room air.  Lab work: UDS was negative.  Urinalysis has small leukocyte esterase and few bacteria microscopic examination but was otherwise unremarkable.  Coronavirus and influenza PCR was negative.  Her CBC was normal.  Unremarkable PT, INR and PTT.  CMP showed a glucose of 140 mg/dL, but was otherwise normal.  Imaging: CT head code stroke, CTA head and neck did not show any acute abnormality.  Please see images and full radiology report for further detail.  Review of Systems: As per HPI otherwise all other  systems reviewed and are negative.  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)    Stroke [facility])    Unspecified essential hypertension    Past Surgical History:  Procedure Laterality Date   BUBBLE STUDY  03/05/2020   Procedure: BUBBLE STUDY;  Surgeon: [name], MD;  Location: [facility] ENDOSCOPY;  Service: Cardiovascular;;   CESAREAN SECTION  1992   LOOP RECORDER INSERTION N/A 03/05/2020   Procedure: LOOP RECORDER INSERTION;  Surgeon: [name], MD;  Location: [facility] INVASIVE CV LAB;  Service: Cardiovascular;  Laterality: N/A;   TEE WITHOUT CARDIOVERSION N/A 03/05/2020   Procedure: TRANSESOPHAGEAL ECHOCARDIOGRAM (TEE);  Surgeon: [name], MD;  Location: [facility] ENDOSCOPY;  Service: Cardiovascular;  Laterality: N/A;   Social History  reports that she has never smoked. She has never used smokeless tobacco. She reports that she does not drink alcohol and does not use drugs.  No Known Allergies  Family History  Problem Relation Age of Onset   Pulmonary embolism Mother    Hepatitis Father    Diabetes Father    Diabetes Mellitus II Sister    Breast cancer Neg Hx    Prior to Admission medications   Medication Sig Start Date End Date Taking? Authorizing Provider  amLODipine (NORVASC) 5 MG tablet Take 1 tablet (5 mg total) by mouth daily. 12/04/20   [name], DO  aspirin 81 MG EC tablet Take 1 tablet (81 mg total) by mouth daily. 02/28/20   [name],  DO  atorvastatin (LIPITOR) 40 MG tablet Take 2 tablets (80 mg total) by mouth daily. 03/16/21   Love, Evlyn Kanner, PA-C  Calcium Carbonate-Vit D-Min (CALCIUM 1200 PO) Take by mouth.    [provider]  Ferrous Sulfate (IRON PO) Take by mouth.    [provider]  Multiple Vitamin (MULTIVITAMIN WITH MINERALS) TABS tablet Take 1 tablet by mouth  daily.    [provider]  polyethylene glycol (MIRALAX / GLYCOLAX) 17 g packet Take 17 g by mouth daily as needed. For constipation--available over the counter. 03/16/21   Jacquelynn Cree, PA-C   Physical Exam: Vitals:   05/09/21 1945 05/09/21 2157 05/09/21 2251 05/09/21 2348  BP: (!) 144/82 (!) 141/99 131/81 115/64  Pulse: 80 98 91 77  Resp: 19 (!) 21 18 18   Temp:  98.1 F (36.7 C) 97.9 F (36.6 C) 98 F (36.7 C)  TempSrc:  Oral Oral Oral  SpO2: 98% 100% 97% 97%  Weight:   57.6 kg   Height:   5\' 2"  (1.575 m)    Constitutional: NAD, calm, comfortable Eyes: PERRL, lids and conjunctivae normal ENMT: Mucous membranes are moist. Posterior pharynx clear of any exudate or lesions. Neck: normal, supple, no masses, no thyromegaly Respiratory: clear to auscultation bilaterally, no wheezing, no crackles. Normal respiratory effort. No accessory muscle use.  Cardiovascular: Regular rate and rhythm, no murmurs / rubs / gallops. No extremity edema. 2+ pedal pulses. No carotid bruits.  Abdomen: No distention.  BS positive.  Soft, no tenderness, no masses palpated. No hepatosplenomegaly. Musculoskeletal: no clubbing / cyanosis. Good ROM, no contractures. Normal muscle tone.  Skin: no rashes, lesions, ulcers on very limited dermatological examination. Neurologic: CN 2-12 grossly intact. Sensation intact, DTR normal.  Good strength on all 4 extremities, but she had impaired coordination on nose to finger exam. Psychiatric: Normal judgment and insight. Alert and oriented x 3. Normal mood.   Labs on Admission: I have personally reviewed following labs and imaging studies  CBC: Recent Labs  Lab 05/09/21 1716  WBC 8.4  NEUTROABS 4.9  HGB 12.4  HCT 36.5  MCV 89.5  PLT 235   Basic Metabolic Panel: Recent Labs  Lab 05/09/21 1716  NA 135  K 3.9  CL 100  CO2 28  GLUCOSE 140*  BUN 16  CREATININE 0.72  CALCIUM 9.0   GFR: Estimated Creatinine Clearance: 59.9 mL/min (by C-G formula  based on SCr of 0.72 mg/dL).  Liver Function Tests: Recent Labs  Lab 05/09/21 1716  AST 38  ALT 37  ALKPHOS 79  BILITOT 1.0  PROT 7.9  ALBUMIN 3.9   Urine analysis:    Component Value Date/Time   COLORURINE STRAW (A) 05/09/2021 1835   APPEARANCEUR CLEAR 05/09/2021 1835        LABSPEC 1.010 05/09/2021 1835   PHURINE 7.5 05/09/2021 1835   GLUCOSEU NEGATIVE 05/09/2021 1835   HGBUR NEGATIVE 05/09/2021 1835   BILIRUBINUR NEGATIVE 05/09/2021 1835        KETONESUR NEGATIVE 05/09/2021 1835   PROTEINUR NEGATIVE 05/09/2021 1835        NITRITE NEGATIVE 05/09/2021 1835   LEUKOCYTESUR SMALL (A) 05/09/2021 1835    Radiological Exams on Admission: CT ANGIO HEAD NECK W WO CM  Result Date: 05/09/2021 CLINICAL DATA:  Dizziness and left facial tingling EXAM: CT ANGIOGRAPHY HEAD AND NECK TECHNIQUE: Multidetector CT imaging of the head and neck was performed using the standard protocol during bolus administration of intravenous contrast. Multiplanar CT image reconstructions and  MIPs were obtained to evaluate the vascular anatomy. Carotid stenosis measurements (when applicable) are obtained utilizing NASCET criteria, using the distal internal carotid diameter as the denominator. CONTRAST:  44mL OMNIPAQUE IOHEXOL 350 MG/ML SOLN COMPARISON:  None. FINDINGS: CTA NECK FINDINGS SKELETON: There is no bony spinal canal stenosis. No lytic or blastic lesion. OTHER NECK: Normal pharynx, larynx and major salivary glands. No cervical lymphadenopathy. Unremarkable thyroid gland. UPPER CHEST: No pneumothorax or pleural effusion. No nodules or masses. AORTIC ARCH: There is no calcific atherosclerosis of the aortic arch. There is no aneurysm, dissection or hemodynamically significant stenosis of the visualized portion of the aorta. Conventional 3 vessel aortic branching pattern. The visualized proximal subclavian arteries are widely patent. RIGHT CAROTID SYSTEM: Normal without aneurysm, dissection or stenosis. LEFT  CAROTID SYSTEM: Normal without aneurysm, dissection or stenosis. VERTEBRAL ARTERIES: Left dominant configuration. Both origins are clearly patent. There is no dissection, occlusion or flow-limiting stenosis to the skull base (V1-V3 segments). CTA HEAD FINDINGS POSTERIOR CIRCULATION: --Vertebral arteries: Normal V4 segments. --Inferior cerebellar arteries: Normal. --Basilar artery: Normal. --Superior cerebellar arteries: Normal. --Posterior cerebral arteries (PCA): Normal. ANTERIOR CIRCULATION: --Intracranial internal carotid arteries: Normal. --Anterior cerebral arteries (ACA): Normal. Both A1 segments are present. Patent anterior communicating artery (a-comm). --Middle cerebral arteries (MCA): Normal. VENOUS SINUSES: As permitted by contrast timing, patent. ANATOMIC VARIANTS: Both posterior communicating arteries are patent. There is fetal origin of the right PCA. Review of the MIP images confirms the above findings. IMPRESSION: Normal CTA of the head and neck. Electronically Signed   By: Deatra Robinson M.D.   On: 05/09/2021 20:16   CT HEAD CODE STROKE WO CONTRAST  Result Date: 05/09/2021 CLINICAL DATA:  Code stroke. Neuro deficit, acute, stroke suspected. Diplopia, vertigo, last known well 1630. Slurred speech. EXAM: CT HEAD WITHOUT CONTRAST TECHNIQUE: Contiguous axial images were obtained from the base of the skull through the vertex without intravenous contrast. COMPARISON:  03/15/2021 FINDINGS: Brain: No acute infarct, mass, midline shift, or extra-axial fluid collection is identified. The lateral and third ventricles are normal in size. The right cerebellar hemorrhage on the prior study has resolved, and there is now extensive encephalomalacia throughout the right cerebellar hemisphere with encephalomalacia also noted in the vermis and superomedial left cerebellar hemisphere. There is ex vacuo dilatation of the fourth ventricle. Vascular: No hyperdense vessel. Skull: No fracture or suspicious osseous  lesion. Sinuses/Orbits: Partially visualized mucosal thickening and secretions in the left maxillary and left sphenoid sinuses. Clear mastoid air cells. Unremarkable orbits. Other: None. ASPECTS [facility] Stroke Program Early CT Score) - Ganglionic level infarction (caudate, lentiform nuclei, internal capsule, insula, M1-M3 cortex): 7 - Supraganglionic infarction (M4-M6 cortex): 3 Total score (0-10 with 10 being normal): 10 IMPRESSION: 1. No evidence of acute intracranial abnormality. ASPECTS of 10. 2. Expected evolution of hemorrhagic cerebellar infarct with resolved hemorrhage and development of extensive encephalomalacia. These results were called by telephone at the time of interpretation on 05/09/2021 at 5:36 pm to Dr. Tilden Fossa, who verbally acknowledged these results. Electronically Signed   By: Sebastian Ache M.D.   On: 05/09/2021 17:37    May/07/2021 echocardiogram  IMPRESSIONS:   1. Left ventricular ejection fraction, by estimation, is 60 to 65%. The  left ventricle has normal function. The left ventricle has no regional  wall motion abnormalities. Left ventricular diastolic parameters are  consistent with Grade I diastolic  dysfunction (impaired relaxation).   2. Right ventricular systolic function is normal. The right ventricular  size is normal.   3. Left  atrial size was mildly dilated.   4. The mitral valve is normal in structure. No evidence of mitral valve  regurgitation. No evidence of mitral stenosis.   5. The aortic valve is normal in structure. Aortic valve regurgitation is  trivial. No aortic stenosis is present.   6. The inferior vena cava is normal in size with greater than 50%  respiratory variability, suggesting right atrial pressure of 3 mmHg.   EKG: Independently reviewed.  Vent. rate 88 BPM PR interval 139 ms QRS duration 83 ms QT/QTcB 354/429 ms P-R-T axes 63 47 23 Sinus rhythm Borderline T abnormalities, anterior leads  Assessment/Plan Principal  problem:   TIA (transient ischemic attack) Observation/telemetry. Frequent neurochecks. Check hemoglobin A1c. Check fasting lipids. Check MRI of brain with and without contrast. Already seen by Dr. Amada JupiterKirkpatrick (telemedicine). Neurology on-call informed about her arrival. The neuro hospitalist team will be following in AM.  Active Problems:   Essential hypertension Hold amlodipine to allow permissive hypertension. Monitor blood pressure closely.    Hyperlipidemia Continue atorvastatin 80 mg p.o. daily.    Prediabetes Carbohydrate modified diet. CBG monitoring with RI SS.    Chronic diastolic heart failure (grade 1 DD) No signs of decompensation at this time. Continue sodium and fluid restriction.    DVT prophylaxis: SCDs. Code Status:   Full code. Family Communication:   Disposition Plan:   Patient is from:  Home.  Anticipated DC to:  Home.  Anticipated DC date:  05/10/2021.  Anticipated DC barriers: Clinical status, pending work-up, consultant sign off.  Consults called:  Teleneurology (Dr. Dionisio PaschalMacneill Kirkpatrick). Admission status:  Observation/telemetry.  Severity of Illness:  High severity after presenting with left-sided numbness associated with ataxia, vertigo and diplopia.  The patient will need to remain for close neurological observation and further work-up.  Bobette Moavid Manuel Aldair Rickel MD [facility]  How to contact the [facility] Attending or Consulting provider 7A - 7P or covering provider during after hours 7P -7A, for this patient?   Check the care team in [facility] and look for a) attending/consulting [facility] provider listed and b) the [facility] team listed Log into [email or web address] and use [place]'s universal password to access. If you do not have the password, please contact the hospital operator. Locate the [facility] provider you are looking for under [facility] and page to a number that you can be directly reached. If you still have difficulty reaching the provider, please  page the [facility] (Director on Call) for the Hospitalists listed on amion for assistance.  05/09/2021, 11:49 PM   This document was prepared using Dragon voice recognition software and may contain some unintended transcription errors.

## 2021-05-09 NOTE — ED Notes (Signed)
ED Provider at bedside. 

## 2021-05-09 NOTE — Consult Note (Signed)
[facility] Telemedicine Consult   Requesting Provider: Peterson Lombard Consult Participants: Patient, husband, bedside nurse Location of the provider: [place], [place] Location of the patient: [facility] [employer]  This consult was provided via telemedicine with 2-way video and audio communication. The patient/family was informed that care would be provided in this way and agreed to receive care in this manner.   Reason for Consult: Transient neurological symptoms Referring Physician: R  CC: Ataxia, diplopia, left-sided numbness  History is obtained from: Patient  HPI: Judy Gutierrez is a 60 y.o. female with a history of recent stroke in May with hemorrhagic conversion who presents with recurrent episode of ataxia, vertigo, diplopia, numbness which lasted approximately 30 minutes.  She was in her normal state of health around 4:45 PM, at which point she leaned over, and then had a sudden attack of double vision, left hand numbness, and vertigo.  She states that this lasted approximately 30 minutes and only resolved after arrival to the emergency department.  She was still symptomatic when going to CT, but after the time she got back she was back to her normal state.  She has right-sided ataxia since her previous stroke, and feels like she is back to her baseline.   LKW: 4:45 PM tpa given?: no, resolution of symptoms IR Thrombectomy? No, no symptoms of LVO Time of teleneurologist evaluation: 17:44  Exam: Vitals:   05/09/21 1730 05/09/21 1800  BP: (!) 180/92 (!) 148/83  Pulse: 90 91  Resp: 18 19  Temp:    SpO2: 100% 100%    General: in bed, NAD  1A: Level of Consciousness - 1B: Ask Month and Age - 0 1C: 'Blink Eyes' & 'Squeeze Hands' - 0 2: Test Horizontal Extraocular Movements - 0 3: Test Visual Fields - 0 4: Test Facial Palsy - 0 5A: Test Left Arm Motor Drift - 0 5B: Test Right Arm Motor Drift - 0 6A: Test Left Leg Motor Drift - 0 6B: Test Right Leg Motor  Drift - 0 7: Test Limb Ataxia - 1(right arm) 8: Test Sensation - 0 9: Test Language/Aphasia- 0 10: Test Dysarthria - 0 11: Test Extinction/Inattention - 0 NIHSS score: 1   Imaging Reviewed: CT head - no acute findings  Labs reviewed in epic and pertinent values follow: Glucose 140   Assessment: 60 year old female with brief episode of ataxia, diplopia, left arm numbness consistent with transient ischemic attack.  Though she was recently admitted, I would favor readmission for MRI and secondary stroke factor prevention.  She is on aspirin monotherapy, with a recent hemorrhagic stroke I think this is reasonable, though may consider dual antiplatelet therapy pending MRI results.  Recommendations:  1) lipids, A1c 2) CT angiogram of the head and neck 3) MRI brain 4) continue ASA 81 mg daily for now 5) stroke team to follow on arrival to [facility].    This patient is receiving care for possible acute neurological changes. There was 55 minutes of care by this provider at the time of service, including time for direct evaluation via telemedicine, review of medical records, imaging studies and discussion of findings with providers, the patient and/or family.  Ritta Slot, MD [facility] [phone]  If 7pm- 7am, please page neurology on call as listed in AMION.

## 2021-05-09 NOTE — ED Notes (Signed)
Patient transported to CT 

## 2021-05-09 NOTE — ED Notes (Signed)
LAST NORMAL WAS 1645HRS TODAY

## 2021-05-10 ENCOUNTER — Observation Stay (HOSPITAL_COMMUNITY): Payer: No Typology Code available for payment source

## 2021-05-10 ENCOUNTER — Encounter (HOSPITAL_COMMUNITY): Payer: Self-pay | Admitting: Internal Medicine

## 2021-05-10 DIAGNOSIS — G459 Transient cerebral ischemic attack, unspecified: Secondary | ICD-10-CM | POA: Diagnosis not present

## 2021-05-10 DIAGNOSIS — I5032 Chronic diastolic (congestive) heart failure: Secondary | ICD-10-CM | POA: Diagnosis not present

## 2021-05-10 DIAGNOSIS — I10 Essential (primary) hypertension: Secondary | ICD-10-CM | POA: Diagnosis not present

## 2021-05-10 DIAGNOSIS — Z8673 Personal history of transient ischemic attack (TIA), and cerebral infarction without residual deficits: Secondary | ICD-10-CM | POA: Diagnosis not present

## 2021-05-10 DIAGNOSIS — E785 Hyperlipidemia, unspecified: Secondary | ICD-10-CM | POA: Diagnosis not present

## 2021-05-10 LAB — HIV ANTIBODY (ROUTINE TESTING W REFLEX): HIV Screen 4th Generation wRfx: NONREACTIVE

## 2021-05-10 LAB — LIPID PANEL
Cholesterol: 87 mg/dL (ref 0–200)
HDL: 43 mg/dL (ref >40)
LDL Cholesterol: 41 mg/dL (ref 0–99)
Total CHOL/HDL Ratio: 2 RATIO
Triglycerides: 16 mg/dL (ref <150)
VLDL: 3 mg/dL (ref 0–40)

## 2021-05-10 LAB — HEMOGLOBIN A1C
Hgb A1c MFr Bld: 6.1 % — ABNORMAL HIGH (ref 4.8–5.6)
Mean Plasma Glucose: 128 mg/dL

## 2021-05-10 MED ORDER — ATORVASTATIN CALCIUM 40 MG PO TABS
40.0000 mg | ORAL_TABLET | Freq: Every day | ORAL | Status: DC
  Administered 2021-05-10 – 2021-05-11 (×2): 40 mg via ORAL
  Filled 2021-05-10 (×2): qty 1

## 2021-05-10 MED ORDER — ATORVASTATIN CALCIUM 80 MG PO TABS: 80.0000 mg | ORAL_TABLET | Freq: Every day | ORAL | Status: DC

## 2021-05-10 MED ORDER — POLYETHYLENE GLYCOL 3350 17 G PO PACK: 17.0000 g | PACK | Freq: Every day | ORAL | Status: DC | PRN

## 2021-05-10 MED ORDER — ASPIRIN EC 81 MG PO TBEC
81.0000 mg | DELAYED_RELEASE_TABLET | Freq: Every day | ORAL | Status: DC
  Administered 2021-05-10 – 2021-05-11 (×2): 81 mg via ORAL
  Filled 2021-05-10 (×2): qty 1

## 2021-05-10 MED ORDER — CLOPIDOGREL BISULFATE 75 MG PO TABS
75.0000 mg | ORAL_TABLET | Freq: Every day | ORAL | Status: DC
  Administered 2021-05-10 – 2021-05-11 (×2): 75 mg via ORAL
  Filled 2021-05-10 (×2): qty 1

## 2021-05-10 MED ORDER — GADOBUTROL 1 MMOL/ML IV SOLN
5.5000 mL | Freq: Once | INTRAVENOUS | Status: AC | PRN
  Administered 2021-05-10: 5.5 mL via INTRAVENOUS

## 2021-05-10 MED ORDER — SODIUM CHLORIDE 0.9 % IV SOLN: INTRAVENOUS | Status: DC

## 2021-05-10 NOTE — Assessment & Plan Note (Addendum)
--   Symptoms resolved and imaging unremarkable.  Seen by stroke service today with recommendation proceed with angiogram 7/26. -- per neurology restart DAPT with aspirin 81 and Plavix 75 for 3 weeks and then Plavix alone. Continue statin. --Outpatient PT, OT

## 2021-05-10 NOTE — Progress Notes (Signed)
PT Cancellation Note  Patient Details Name: Judy Gutierrez MRN: [id number] DOB: 1961-01-30   Cancelled Treatment:    Reason Eval/Treat Not Completed: Patient at procedure or test/unavailable Attempted to evaluate patient but she was not available/not in room. Will continue to follow and attempt to return later today if time/schedule allow.   Madelaine Etienne, DPT, PN1   Supplemental Physical Therapist [facility]    Pager [phone] Acute Rehab Office [phone]

## 2021-05-10 NOTE — Assessment & Plan Note (Signed)
-   continue atorvastatin

## 2021-05-10 NOTE — Assessment & Plan Note (Addendum)
--  asymptomatic. Not on diuretic as outpatient. Appears euvolemic. Heart healthy diet. Monitor.

## 2021-05-10 NOTE — Evaluation (Signed)
Physical Therapy Evaluation Patient Details Name: Judy Gutierrez MRN: [id number] DOB: [birth year] Today's Date: 05/10/2021   History of Present Illness  60 y.o. female presenting to ED 7/24 as code stroke with L-sided weakness, dizziness, double vision and difficulty ambulating with symptoms resolving afte 30 minutes. CT (-) for acute findings. 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  Clinical Impression   Patient received in bed, very pleasant and cooperative today. Able to mobilize on a min guard-MinA basis. Unfortunately only SPC available was bariatric, and weight of cane did tend to cause her to lose a bit of balance to the left/needed MinA to recover. Reports that she is very close to her pre-admission baseline. Left in bed with all needs met, MD present and attending. Will continue to follow.     Follow Up Recommendations Outpatient PT;Other (comment) (neuro OP PT)    Equipment Recommendations  None recommended by PT (well equipped)    Recommendations for Other Services       Precautions / Restrictions Precautions Precautions: Fall Restrictions Weight Bearing Restrictions: No      Mobility  Bed Mobility Overal bed mobility: Modified Independent                  Transfers Overall transfer level: Needs assistance Equipment used: Straight cane Transfers: Sit to/from Stand Sit to Stand: Min guard         General transfer comment: min guard for safety, no physical assist given  Ambulation/Gait Ambulation/Gait assistance: Min assist Gait Distance (Feet): 100 Feet Assistive device: Straight cane Gait Pattern/deviations: Step-through pattern;Decreased weight shift to right;Drifts right/left Gait velocity: decreased   General Gait Details: mostly min guard but occasional MinA for balance due to intermittent mild LOB to the left- she reports SPC here is heavier than hers and is throwing off her  balance (bariatric SPC was only one available, so this is reasonable)  Stairs            Wheelchair Mobility    Modified Rankin (Stroke Patients Only)       Balance Overall balance assessment: Needs assistance Sitting-balance support: Feet supported Sitting balance-Leahy Scale: Good     Standing balance support: Single extremity supported;During functional activity Standing balance-Leahy Scale: Poor Standing balance comment: Reliant on at least unilateral UE support for dynamic balance.                             Pertinent Vitals/Pain Pain Assessment: No/denies pain    Home Living Family/patient expects to be discharged to:: Private residence Living Arrangements: Spouse/significant other Available Help at Discharge: Family Type of Home: House Home Access: Stairs to enter Entrance Stairs-Rails: Chemical engineer of Steps: 4 Home Layout: Two level;Bed/bath upstairs Home Equipment: Tub bench;Shower seat;Walker - 2 wheels;Cane - single point Additional Comments: Has RW, BSC, shower chair    Prior Function Level of Independence: Independent with assistive device(s)         Comments: SPC in for mobility in home and community dwellings; Mod I with ADLs using SPC, can make small meals for herself; family provides transportation. Reports being active with [facility] therapies with last day planned for 7/25. Patient reports plan to transition to OP therapies post d/c.     Hand Dominance   Dominant Hand: Right    Extremity/Trunk Assessment   Upper Extremity Assessment Upper Extremity Assessment: Defer to OT evaluation  Lower Extremity Assessment Lower Extremity Assessment: Generalized weakness;RLE deficits/detail RLE Deficits / Details: RLE functionally slightly weaker than the LLE, definite impairment in coordination and proprioception noted RLE Sensation: decreased proprioception RLE Coordination: decreased gross motor        Communication   Communication: No difficulties  Cognition Arousal/Alertness: Awake/alert Behavior During Therapy: WFL for tasks assessed/performed Overall Cognitive Status: Within Functional Limits for tasks assessed                                        General Comments      Exercises     Assessment/Plan    PT Assessment Patient needs continued PT services  PT Problem List Decreased strength;Decreased activity tolerance;Decreased mobility;Decreased balance;Decreased coordination       PT Treatment Interventions DME instruction;Balance training;Gait training;Stair training;Functional mobility training;Patient/family education;Therapeutic activities;Therapeutic exercise    PT Goals (Current goals can be found in the Care Plan section)  Acute Rehab PT Goals Patient Stated Goal: To return home. PT Goal Formulation: With patient/family Time For Goal Achievement: 05/24/21 Potential to Achieve Goals: Good    Frequency Min 3X/week   Barriers to discharge        Co-evaluation               AM-PAC PT "6 Clicks" Mobility  Outcome Measure Help needed turning from your back to your side while in a flat bed without using bedrails?: None Help needed moving from lying on your back to sitting on the side of a flat bed without using bedrails?: None Help needed moving to and from a bed to a chair (including a wheelchair)?: A Little Help needed standing up from a chair using your arms (e.g., wheelchair or bedside chair)?: A Little Help needed to walk in hospital room?: A Little Help needed climbing 3-5 steps with a railing? : A Little 6 Click Score: 20    End of Session   Activity Tolerance: Patient tolerated treatment well Patient left: in bed;with call bell/phone within reach;with family/visitor present Nurse Communication: Mobility status PT Visit Diagnosis: Unsteadiness on feet (R26.81);Muscle weakness (generalized) (M62.81);Difficulty in walking, not  elsewhere classified (R26.2);Other symptoms and signs involving the nervous system (R29.898)    Time: 4008-6761 PT Time Calculation (min) (ACUTE ONLY): 15 min   Charges:   PT Evaluation $PT Eval Moderate Complexity: 1 Mod         Windell Norfolk, DPT, PN1   Supplemental Physical Therapist [place]    Pager [phone] Acute Rehab Office [phone]

## 2021-05-10 NOTE — Progress Notes (Signed)
SLP Cancellation Note  Patient Details Name: Judy Gutierrez MRN: [id number] DOB: 1961-03-02   Cancelled treatment:       Reason Eval/Treat Not Completed: SLP screened, no needs identified, will sign off Pt did not present with any acute speech, language, or cognitive-linguistic deficits on admission, MRI was negative, and no overt communication or cognitive-linguistic deficits were noted by OT, PT, or RN. A formal evaluation does not appear to be clinically indicated at this time. SLP will sign off.  Kmari Halter I. Vear Clock, MS, CCC-SLP Acute Rehabilitation Services Office number [phone] Pager [phone]  Scheryl Marten 05/10/2021, 3:41 PM

## 2021-05-10 NOTE — Progress Notes (Signed)
PROGRESS NOTE  Judy Gutierrez OEV:[id number] DOB: 04-21-1961 DOA: 05/09/2021 PCP: [name], DO  Brief History   60 year old woman PMH acute stroke with hemorrhagic conversion May 2022 who presented to the emergency department 7/24 after developing sudden onset dizziness, double vision, numbness and difficulty ambulating.  Was seen by teleneurology, code stroke called, symptoms quickly resolved and patient was admitted for further evaluation.    A & P  * TIA (transient ischemic attack) -- Symptoms resolved and imaging unremarkable.  Seen by stroke service today with recommendation proceed with angiogram 7/26. -- Continue aspirin and atorvastatin. --Outpatient PT, OT  Essential hypertension --stable. Amlodipine on hold for now.  Hyperlipidemia --continue atorvastatin  Chronic diastolic heart failure (grade 1 DD) --asymptomatic. Not on diuretic as outpatient. Appears euvolemic. Heart healthy diet. Monitor.  Prediabetes --carb modified diet  Disposition Plan:  Discussion: back to baseline, management per neurology which has planned for angiogram 7/26 to rule out vascular abnormality  Dispo: The patient is from: Home              Anticipated d/c is to: Home              Patient currently is not medically stable to d/c.   Difficult to place patient No  DVT prophylaxis: SCDs Start: 05/09/21 2315   Code Status: Full Code Level of care: Telemetry Medical Family Communication: husband at bedside  Brendia Sacks, MD  [facility] Direct contact: see [email or web address] (further directions at bottom of note if needed) 7PM-7AM contact night coverage as at bottom of note 05/10/2021, 3:30 PM  LOS: 0 days   Significant Hospital Events   7/24 admit for TIA   Consults:  Neurology  IR   Procedures:  Angiogram >   Interval History/Subjective  CC: f/u TIA  Patient at baseline, no new neurologic symptoms or issues.  Symptoms resolved yesterday at the emergency  department.  Objective   Vitals:  Vitals:   05/10/21 0954 05/10/21 1208  BP: 136/83 (!) 139/92  Pulse: 83 72  Resp: 17 17  Temp: 98 F (36.7 C) 98.3 F (36.8 C)  SpO2: 96% 96%    Exam: Physical Exam Vitals reviewed.  Constitutional:      Appearance: Normal appearance.  Cardiovascular:     Rate and Rhythm: Normal rate and regular rhythm.     Heart sounds: No murmur heard.    Comments: Telemetry SR Pulmonary:     Effort: Pulmonary effort is normal. No respiratory distress.     Breath sounds: Normal breath sounds. No wheezing, rhonchi or rales.  Musculoskeletal:     Right lower leg: No edema.     Left lower leg: No edema.  Neurological:     Mental Status: She is alert.  Psychiatric:        Mood and Affect: Mood normal.        Behavior: Behavior normal.    I have personally reviewed the labs and other data, making special note of:   Today's Data  LDL 41   Scheduled Meds:  aspirin EC  81 mg Oral Daily   atorvastatin  40 mg Oral Daily   clopidogrel  75 mg Oral Daily   Continuous Infusions:  [START ON 05/11/2021] sodium chloride      Principal Problem:   TIA (transient ischemic attack) Active Problems:   Essential hypertension   Hyperlipidemia   Prediabetes   Chronic diastolic heart failure (grade 1 DD)   LOS: 0 days   How to  contact the [facility] Attending or Consulting provider 7A - 7P or covering provider during after hours 7P -7A, for this patient?  Check the care team in [facility] and look for a) attending/consulting TRH provider listed and b) the [facility] team listed Log into [email or web address] and use [place]'s universal password to access. If you do not have the password, please contact the hospital operator. Locate the [facility] provider you are looking for under [facility] and page to a number that you can be directly reached. If you still have difficulty reaching the provider, please page the [facility] (Director on Call) for the Hospitalists listed on amion for  assistance.

## 2021-05-10 NOTE — Progress Notes (Addendum)
STROKE TEAM PROGRESS NOTE   ATTENDING NOTE: I reviewed above note and agree with the assessment and plan. Pt was seen and examined.   60 year old female with history of hypertension and recent stroke presented to ER for ataxia, diplopia and left face and hand numbness, lasted 10 minutes and resolved.  Patient had a stroke in 01/2020 with left cerebellar infarct, CTA head and neck unremarkable, TCD bubble study negative DVT negative, 2D echo unremarkable.  LDL 138, A1c 6.0, hypercoagulable work-up negative.  Discharged on DAPT for 3 weeks and Lipitor 40.  Had loop recorder placed 02/2020.  02/2021 admitted for right PICA large stroke and right SCA stroke and serial CTs and MRIs showed hemorrhagic conversion, mild IVH and mild hydrocephalus.  Loop recorder interrogation no A. fib.  LDL 51, A1c 6.0, EF 60 to 73%.  Antiplatelet regimen changed from DAPT to single agent to hold off antiplatelet and eventually discharged with aspirin 81 and Lipitor 40.  Plan to have outpatient cerebral angiogram to evaluate posterior circulation.  Patient has appoint with Dr. [name] at [facility] tomorrow 3 PM.  However patient admitted at this time for acute onset diplopia, left hand and face numbness, wobbly gait yesterday.  Currently symptomology resolved.  Neuro examination intact.  CT no acute abnormality, chronic stroke on the right cerebellum.  CTA head and neck unremarkable.  MRI no acute infarct.  LDL 41, A1c pending.  UDS negative.  Creatinine 0.72.  Given her hemorrhagic conversion or resolved, restart DAPT with aspirin 81 and Plavix 75 for 3 weeks and then Plavix alone.  Continue Lipitor 40.  Discussed with patient, husband and Dr. Corliss Skains, will plan for cerebral angiogram in a.m.  PT/OT recommend outpatient PT/OT.  For detailed assessment and plan, please refer to above as I have made changes wherever appropriate.   [name], MD PhD Stroke Neurology 05/10/2021 11:20 AM    INTERVAL HISTORY Her husband is at  the bedside providing support. He is eager to discuss plan of care.   Vitals:   05/09/21 2348 05/10/21 0200 05/10/21 0346 05/10/21 0954  BP: 115/64 119/71 119/78 136/83  Pulse: 77 69 72 83  Resp: 18 16 16 17   Temp: 98 F (36.7 C)  98.3 F (36.8 C) 98 F (36.7 C)  TempSrc: Oral  Oral Oral  SpO2: 97% 97% 97% 96%  Weight:      Height:       CBC:  Recent Labs  Lab 05/09/21 1716  WBC 8.4  NEUTROABS 4.9  HGB 12.4  HCT 36.5  MCV 89.5  PLT 235   Basic Metabolic Panel:  Recent Labs  Lab 05/09/21 1716  NA 135  K 3.9  CL 100  CO2 28  GLUCOSE 140*  BUN 16  CREATININE 0.72  CALCIUM 9.0    Lipid Panel:  Recent Labs  Lab 05/10/21 0118  CHOL 87  TRIG 16  HDL 43  CHOLHDL 2.0  VLDL 3  LDLCALC 41    HgbA1c: No results for input(s): HGBA1C in the last 168 hours. Urine Drug Screen:  Recent Labs  Lab 05/09/21 1835  LABOPIA NONE DETECTED  COCAINSCRNUR NONE DETECTED  LABBENZ NONE DETECTED  AMPHETMU NONE DETECTED  THCU NONE DETECTED  LABBARB NONE DETECTED    Alcohol Level  Recent Labs  Lab 05/09/21 1716  ETH <10    IMAGING past 24 hours CT ANGIO HEAD NECK W WO CM  Result Date: 05/09/2021 CLINICAL DATA:  Dizziness and left facial tingling EXAM: CT ANGIOGRAPHY HEAD  AND NECK TECHNIQUE: Multidetector CT imaging of the head and neck was performed using the standard protocol during bolus administration of intravenous contrast. Multiplanar CT image reconstructions and MIPs were obtained to evaluate the vascular anatomy. Carotid stenosis measurements (when applicable) are obtained utilizing NASCET criteria, using the distal internal carotid diameter as the denominator. CONTRAST:  45mL OMNIPAQUE IOHEXOL 350 MG/ML SOLN COMPARISON:  None. FINDINGS: CTA NECK FINDINGS SKELETON: There is no bony spinal canal stenosis. No lytic or blastic lesion. OTHER NECK: Normal pharynx, larynx and major salivary glands. No cervical lymphadenopathy. Unremarkable thyroid gland. UPPER CHEST: No  pneumothorax or pleural effusion. No nodules or masses. AORTIC ARCH: There is no calcific atherosclerosis of the aortic arch. There is no aneurysm, dissection or hemodynamically significant stenosis of the visualized portion of the aorta. Conventional 3 vessel aortic branching pattern. The visualized proximal subclavian arteries are widely patent. RIGHT CAROTID SYSTEM: Normal without aneurysm, dissection or stenosis. LEFT CAROTID SYSTEM: Normal without aneurysm, dissection or stenosis. VERTEBRAL ARTERIES: Left dominant configuration. Both origins are clearly patent. There is no dissection, occlusion or flow-limiting stenosis to the skull base (V1-V3 segments). CTA HEAD FINDINGS POSTERIOR CIRCULATION: --Vertebral arteries: Normal V4 segments. --Inferior cerebellar arteries: Normal. --Basilar artery: Normal. --Superior cerebellar arteries: Normal. --Posterior cerebral arteries (PCA): Normal. ANTERIOR CIRCULATION: --Intracranial internal carotid arteries: Normal. --Anterior cerebral arteries (ACA): Normal. Both A1 segments are present. Patent anterior communicating artery (a-comm). --Middle cerebral arteries (MCA): Normal. VENOUS SINUSES: As permitted by contrast timing, patent. ANATOMIC VARIANTS: Both posterior communicating arteries are patent. There is fetal origin of the right PCA. Review of the MIP images confirms the above findings. IMPRESSION: Normal CTA of the head and neck. Electronically Signed   By: Deatra Robinson M.D.   On: 05/09/2021 20:16   MR BRAIN W WO CONTRAST  Result Date: 05/10/2021 CLINICAL DATA:  Neuro deficit, acute, stroke suspected EXAM: MRI HEAD WITHOUT AND WITH CONTRAST TECHNIQUE: Multiplanar, multiecho pulse sequences of the brain and surrounding structures were obtained without and with intravenous contrast. CONTRAST:  5.15mL GADAVIST GADOBUTROL 1 MMOL/ML IV SOLN COMPARISON:  03/03/2021 FINDINGS: Brain: There is no acute infarction or intracranial hemorrhage. There is no intracranial mass,  mass effect, or edema. There is no hydrocephalus or extra-axial fluid collection. Right greater than left cerebellar hemorrhagic encephalomalacia. Ventricles are stable in size. Chronic infarcts of the dorsal midbrain. Additional patchy foci of T2 hyperintensity in the supratentorial white matter are nonspecific but may reflect stable mild chronic microvascular ischemic changes. No abnormal enhancement. Vascular: Major vessel flow voids at the skull base are preserved. Skull and upper cervical spine: Normal marrow signal is preserved. Sinuses/Orbits: Left maxillary sinus circumferential mucosal thickening with some aerosolized secretions. Mild mucosal thickening elsewhere. Orbits are unremarkable. Other: Sella is unremarkable.  Mastoid air cells are clear. IMPRESSION: No acute intracranial abnormality. Right greater than left cerebellar hemorrhagic encephalomalacia. Mild chronic microvascular ischemic changes. Electronically Signed   By: Guadlupe Spanish M.D.   On: 05/10/2021 09:38   CT HEAD CODE STROKE WO CONTRAST  Result Date: 05/09/2021 CLINICAL DATA:  Code stroke. Neuro deficit, acute, stroke suspected. Diplopia, vertigo, last known well 1630. Slurred speech. EXAM: CT HEAD WITHOUT CONTRAST TECHNIQUE: Contiguous axial images were obtained from the base of the skull through the vertex without intravenous contrast. COMPARISON:  03/15/2021 FINDINGS: Brain: No acute infarct, mass, midline shift, or extra-axial fluid collection is identified. The lateral and third ventricles are normal in size. The right cerebellar hemorrhage on the prior study has resolved, and there is  now extensive encephalomalacia throughout the right cerebellar hemisphere with encephalomalacia also noted in the vermis and superomedial left cerebellar hemisphere. There is ex vacuo dilatation of the fourth ventricle. Vascular: No hyperdense vessel. Skull: No fracture or suspicious osseous lesion. Sinuses/Orbits: Partially visualized mucosal  thickening and secretions in the left maxillary and left sphenoid sinuses. Clear mastoid air cells. Unremarkable orbits. Other: None. ASPECTS [facility] Stroke Program Early CT Score) - Ganglionic level infarction (caudate, lentiform nuclei, internal capsule, insula, M1-M3 cortex): 7 - Supraganglionic infarction (M4-M6 cortex): 3 Total score (0-10 with 10 being normal): 10 IMPRESSION: 1. No evidence of acute intracranial abnormality. ASPECTS of 10. 2. Expected evolution of hemorrhagic cerebellar infarct with resolved hemorrhage and development of extensive encephalomalacia. These results were called by telephone at the time of interpretation on 05/09/2021 at 5:36 pm to Dr. Tilden Fossa, who verbally acknowledged these results. Electronically Signed   By: Sebastian Ache M.D.   On: 05/09/2021 17:37    PHYSICAL EXAM  Neuro: MS: awake, alert, and oriented to person, place and time. Speech/ language: fluent, comprehension intact, exam benign. Cranial Nerves: CN II :  Pearl. 69mm in siz CN III,IV, VI:  EOM intact, no gaze preference or deviation, no nystagmus CN V:   normal sensation in V1, V2, and V3 distribution bilaterally. CN VII:  normal hearing to speech  CN IX, X Normal palatal elevation; no uvular deviation CN XI:  5/5 head turn and 5/5 shoulder shrug CN XII: Midline tongue protrusion.   ASSESSMENT/PLAN [name] is a 60 y.o. female with history of recent small left cerebellar stroke with petechial hemorrhage in 4/22 and large acute/right PICA territory stroke in May with hemorrhagic conversion who presents with recurrent episode of ataxia, vertigo, diplopia, numbness which lasted approximately 30 minutes.  She was in her normal state of health around 4:45 PM on 05/09/21, at which point she leaned over, and then had a sudden attack of double vision, left hand numbness, and vertigo.  She states that this lasted approximately 30 minutes and only resolved after arrival to the emergency department.   She was still symptomatic when going to CT, but after the time she got back she was back to her normal state.  She has right-sided ataxia since her previous strokes. CTA done of her head and neck were normal on arrival to ED.  Presenting NIHSS was 1.   MRI brain done shows no acute intracrnial abnormality, and reveals bilateral hemorrhagic encephalomalacia.  Stroke TIA:   secondary to small vessel disease source   CTA head & neck  normal MRI  brain w and w/o contrast shows no acute intracranial abnormality.      2D Echo  5/10/222: LVEF 60%, mildly dated LA, no IA shunt   LDL 41 HgbA1c 6.0 VTE prophylaxis -      Diet   Diet Heart Room service appropriate? Yes; Fluid consistency: Thin   aspirin 81 mg daily prior to admission, now on aspirin 81 mg daily and clopidogrel 75 mg daily.  Continue with aspirin and plavix x 21 days then continue with aspirin daily  Therapy recommendations:  outpt OT Disposition:  home   Hypertension Home meds:  norvasc 5mg  daily UStable Permissive hypertension (OK if < 220/120) but gradually normalize in 5-7 days Long-term BP goal normotensive  Hyperlipidemia Home meds:   none,   LDL 41, goal < 70 Add  lipitor 40mg    High intensity statin started  Continue statin at discharge    HgbA1c 6.0,  goal < 7.0 CBGs Recent Labs    05/09/21 1733  GLUCAP 117*    SSI  Other Stroke Risk Factors  Hx stroke/TIA  Other Active Problems Essential hypertension: ok to use amlodipine given negative MRI brain Hyperlipidemia: on lipitor 40mg  daily  Prediabetes Chronic diastolic heart failure (grade 1)  No signs of decompensation at this time.   Hospital day # 0     To contact Stroke Continuity provider, please refer to [email or web address]. After hours, contact General Neurology

## 2021-05-10 NOTE — Assessment & Plan Note (Signed)
--  stable. Amlodipine on hold for now.

## 2021-05-10 NOTE — Consult Note (Signed)
Chief Complaint: Patient was seen in consultation today for right PICA stroke with hemorrhagic conversion  Referring Physician(s): Dr. [name]  Supervising Physician: Julieanne Cotton  Patient Status: [facility] - In-pt  History of Present Illness: Judy Gutierrez is a 60 y.o. 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  She underwent inpatient rehab with plans for Neuro follow-up but had not yet completed her appointment when she had sudden onset double vision, left hand numbness, and dizziness at home.  CTA Head/Neck, MR Brain all negative for acute abnormality, however due to her history of intra-cranial hemorrhage, there is ongoing question of vascular abnormality vs. Dissection.  NIR consulted for diagnostic angiogram at the request of Dr. Roda Shutters.   Patient assessed at bedside.  She is accompanied by her husband. They are aware of plans for diagnostic angiogram and are agreeable to proceed.   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)    Stroke [facility])    Unspecified essential hypertension     Past Surgical History:  Procedure Laterality Date   BUBBLE STUDY  03/05/2020   Procedure: BUBBLE STUDY;  Surgeon: [name], MD;  Location: [facility] ENDOSCOPY;  Service: Cardiovascular;;   CESAREAN SECTION  1992   LOOP RECORDER INSERTION N/A 03/05/2020   Procedure: LOOP RECORDER INSERTION;  Surgeon: [name], MD;  Location: [facility] INVASIVE CV LAB;  Service: Cardiovascular;  Laterality: N/A;   TEE WITHOUT CARDIOVERSION N/A 03/05/2020   Procedure: TRANSESOPHAGEAL ECHOCARDIOGRAM (TEE);  Surgeon: [name], MD;  Location: [facility] ENDOSCOPY;  Service:  Cardiovascular;  Laterality: N/A;    Allergies: Patient has no known allergies.  Medications: Prior to Admission medications   Medication Sig Start Date End Date Taking? Authorizing Provider  acetaminophen (TYLENOL) 325 MG tablet Take 325 mg by mouth every 6 (six) hours as needed for mild pain or headache.   Yes [provider]  amLODipine (NORVASC) 5 MG tablet Take 1 tablet (5 mg total) by mouth daily. 12/04/20  Yes [name], DO  aspirin 81 MG EC tablet Take 1 tablet (81 mg total) by mouth daily. 02/28/20  Yes Seabron Spates R, DO  atorvastatin (LIPITOR) 40 MG tablet Take 2 tablets (80 mg total) by mouth daily. Patient taking differently: Take 80 mg by mouth every evening. 03/16/21  Yes Love, Evlyn Kanner, PA-C  Calcium Carbonate-Vit D-Min (CALCIUM 1200 PO) Take 1 tablet by mouth daily.   Yes [provider]  Ferrous Sulfate (IRON PO) Take 1 tablet by mouth daily.   Yes [provider]  Multiple Vitamin (MULTIVITAMIN WITH MINERALS) TABS tablet Take 1 tablet by mouth daily.   Yes [provider]     Family History  Problem Relation Age of Onset   Pulmonary embolism Mother    Hepatitis Father    Diabetes Father    Diabetes Mellitus II Sister    Breast cancer Neg Hx     Social History   Socioeconomic History   Marital status: Married    Spouse name: Not on file   Number of children: 1   Years of education: Not on file   Highest education level: Not on file  Occupational History  Occupation: Science writer    Comment: self employed  Tobacco Use   Smoking status: Never   Smokeless tobacco: Never  Substance and Sexual Activity   Alcohol use: No   Drug use: No   Sexual activity: Yes  Other Topics Concern   Not on file  Social History Narrative   Lives with husband   Right Handed   Drinks caffeine seldom   Social Determinants of Health   Financial Resource Strain: Not on file  Food Insecurity: Not on file   Transportation Needs: Not on file  Physical Activity: Not on file  Stress: Not on file  Social Connections: Not on file     Review of Systems: A 12 point ROS discussed and pertinent positives are indicated in the HPI above.  All other systems are negative.  Review of Systems  Constitutional:  Negative for fatigue and fever.  Respiratory:  Negative for cough and shortness of breath.   Cardiovascular:  Negative for chest pain.  Gastrointestinal:  Negative for abdominal pain.  Musculoskeletal:  Negative for back pain.  Psychiatric/Behavioral:  Negative for behavioral problems and confusion.    Vital Signs: BP (!) 139/92 (BP Location: Left Arm)   Pulse 72   Temp 98.3 F (36.8 C) (Oral)   Resp 17   Ht 5\' 2"  (1.575 m)   Wt 126 lb 15.8 oz (57.6 kg)   LMP 12/08/2010   SpO2 96%   BMI 23.23 kg/m   Physical Exam Vitals and nursing note reviewed.  Constitutional:      General: She is not in acute distress.    Appearance: Normal appearance. She is not ill-appearing.  HENT:     Mouth/Throat:     Mouth: Mucous membranes are moist.     Pharynx: Oropharynx is clear.  Cardiovascular:     Rate and Rhythm: Normal rate and regular rhythm.  Pulmonary:     Effort: Pulmonary effort is normal. No respiratory distress.     Breath sounds: Normal breath sounds.  Abdominal:     General: Abdomen is flat.     Palpations: Abdomen is soft.  Skin:    General: Skin is warm and dry.     Coloration: Skin is not jaundiced.  Neurological:     General: No focal deficit present.     Mental Status: She is alert and oriented to person, place, and time. Mental status is at baseline.  Psychiatric:        Mood and Affect: Mood normal.        Behavior: Behavior normal.        Thought Content: Thought content normal.        Judgment: Judgment normal.     MD Evaluation Airway: WNL Heart: WNL Abdomen: WNL Chest/ Lungs: WNL ASA  Classification: 3 Mallampati/Airway Score: Two   Imaging: CT ANGIO  HEAD NECK W WO CM  Result Date: 05/09/2021 CLINICAL DATA:  Dizziness and left facial tingling EXAM: CT ANGIOGRAPHY HEAD AND NECK TECHNIQUE: Multidetector CT imaging of the head and neck was performed using the standard protocol during bolus administration of intravenous contrast. Multiplanar CT image reconstructions and MIPs were obtained to evaluate the vascular anatomy. Carotid stenosis measurements (when applicable) are obtained utilizing NASCET criteria, using the distal internal carotid diameter as the denominator. CONTRAST:  68mL OMNIPAQUE IOHEXOL 350 MG/ML SOLN COMPARISON:  None. FINDINGS: CTA NECK FINDINGS SKELETON: There is no bony spinal canal stenosis. No lytic or blastic lesion. OTHER NECK: Normal pharynx, larynx and major salivary  glands. No cervical lymphadenopathy. Unremarkable thyroid gland. UPPER CHEST: No pneumothorax or pleural effusion. No nodules or masses. AORTIC ARCH: There is no calcific atherosclerosis of the aortic arch. There is no aneurysm, dissection or hemodynamically significant stenosis of the visualized portion of the aorta. Conventional 3 vessel aortic branching pattern. The visualized proximal subclavian arteries are widely patent. RIGHT CAROTID SYSTEM: Normal without aneurysm, dissection or stenosis. LEFT CAROTID SYSTEM: Normal without aneurysm, dissection or stenosis. VERTEBRAL ARTERIES: Left dominant configuration. Both origins are clearly patent. There is no dissection, occlusion or flow-limiting stenosis to the skull base (V1-V3 segments). CTA HEAD FINDINGS POSTERIOR CIRCULATION: --Vertebral arteries: Normal V4 segments. --Inferior cerebellar arteries: Normal. --Basilar artery: Normal. --Superior cerebellar arteries: Normal. --Posterior cerebral arteries (PCA): Normal. ANTERIOR CIRCULATION: --Intracranial internal carotid arteries: Normal. --Anterior cerebral arteries (ACA): Normal. Both A1 segments are present. Patent anterior communicating artery (a-comm). --Middle  cerebral arteries (MCA): Normal. VENOUS SINUSES: As permitted by contrast timing, patent. ANATOMIC VARIANTS: Both posterior communicating arteries are patent. There is fetal origin of the right PCA. Review of the MIP images confirms the above findings. IMPRESSION: Normal CTA of the head and neck. Electronically Signed   By: Deatra Robinson M.D.   On: 05/09/2021 20:16   MR BRAIN W WO CONTRAST  Result Date: 05/10/2021 CLINICAL DATA:  Neuro deficit, acute, stroke suspected EXAM: MRI HEAD WITHOUT AND WITH CONTRAST TECHNIQUE: Multiplanar, multiecho pulse sequences of the brain and surrounding structures were obtained without and with intravenous contrast. CONTRAST:  5.31mL GADAVIST GADOBUTROL 1 MMOL/ML IV SOLN COMPARISON:  03/03/2021 FINDINGS: Brain: There is no acute infarction or intracranial hemorrhage. There is no intracranial mass, mass effect, or edema. There is no hydrocephalus or extra-axial fluid collection. Right greater than left cerebellar hemorrhagic encephalomalacia. Ventricles are stable in size. Chronic infarcts of the dorsal midbrain. Additional patchy foci of T2 hyperintensity in the supratentorial white matter are nonspecific but may reflect stable mild chronic microvascular ischemic changes. No abnormal enhancement. Vascular: Major vessel flow voids at the skull base are preserved. Skull and upper cervical spine: Normal marrow signal is preserved. Sinuses/Orbits: Left maxillary sinus circumferential mucosal thickening with some aerosolized secretions. Mild mucosal thickening elsewhere. Orbits are unremarkable. Other: Sella is unremarkable.  Mastoid air cells are clear. IMPRESSION: No acute intracranial abnormality. Right greater than left cerebellar hemorrhagic encephalomalacia. Mild chronic microvascular ischemic changes. Electronically Signed   By: Guadlupe Spanish M.D.   On: 05/10/2021 09:38   CUP PACEART REMOTE DEVICE CHECK  Result Date: 04/19/2021 ILR summary report received. Battery status OK.  Normal device function. No new symptom, tachy, brady, or pause episodes. No new AF episodes. Monthly summary reports and ROV/PRN Hassell Halim, RN, CCDS, [employer]  CT HEAD CODE STROKE WO CONTRAST  Result Date: 05/09/2021 CLINICAL DATA:  Code stroke. Neuro deficit, acute, stroke suspected. Diplopia, vertigo, last known well 1630. Slurred speech. EXAM: CT HEAD WITHOUT CONTRAST TECHNIQUE: Contiguous axial images were obtained from the base of the skull through the vertex without intravenous contrast. COMPARISON:  03/15/2021 FINDINGS: Brain: No acute infarct, mass, midline shift, or extra-axial fluid collection is identified. The lateral and third ventricles are normal in size. The right cerebellar hemorrhage on the prior study has resolved, and there is now extensive encephalomalacia throughout the right cerebellar hemisphere with encephalomalacia also noted in the vermis and superomedial left cerebellar hemisphere. There is ex vacuo dilatation of the fourth ventricle. Vascular: No hyperdense vessel. Skull: No fracture or suspicious osseous lesion. Sinuses/Orbits: Partially visualized mucosal thickening and  secretions in the left maxillary and left sphenoid sinuses. Clear mastoid air cells. Unremarkable orbits. Other: None. ASPECTS [facility] Stroke Program Early CT Score) - Ganglionic level infarction (caudate, lentiform nuclei, internal capsule, insula, M1-M3 cortex): 7 - Supraganglionic infarction (M4-M6 cortex): 3 Total score (0-10 with 10 being normal): 10 IMPRESSION: 1. No evidence of acute intracranial abnormality. ASPECTS of 10. 2. Expected evolution of hemorrhagic cerebellar infarct with resolved hemorrhage and development of extensive encephalomalacia. These results were called by telephone at the time of interpretation on 05/09/2021 at 5:36 pm to Dr. Tilden Fossa, who verbally acknowledged these results. Electronically Signed   By: Sebastian Ache M.D.   On: 05/09/2021 17:37     Labs:  CBC: Recent Labs    03/08/21 0945 03/11/21 0500 03/15/21 0751 05/09/21 1716  WBC 6.0 5.3 5.4 8.4  HGB 12.4 11.9* 12.4 12.4  HCT 36.8 35.7* 36.7 36.5  PLT 299 293 277 235    COAGS: Recent Labs    02/23/21 0040 05/09/21 1716  INR 1.1 1.0  APTT 23* 28    BMP: Recent Labs    03/05/21 0615 03/08/21 0945 03/11/21 0500 05/09/21 1716  NA 134* 135 137 135  K 3.9 3.9 3.8 3.9  CL 104 102 102 100  CO2 25 25 27 28   GLUCOSE 116* 134* 117* 140*  BUN 10 15 12 16   CALCIUM 8.8* 9.0 9.0 9.0  CREATININE 0.63 0.58 0.65 0.72  GFRNONAA >60 >60 >60 >60    LIVER FUNCTION TESTS: Recent Labs    03/01/21 0500 03/05/21 0615 03/11/21 0500 05/09/21 1716  BILITOT 1.2 0.8 1.1 1.0  AST 56* 45* 36 38  ALT 47* 40 30 37  ALKPHOS 59 72 61 79  PROT 6.8 7.5 7.1 7.9  ALBUMIN 3.0* 3.4* 3.2* 3.9    TUMOR MARKERS: No results for input(s): AFPTM, CEA, CA199, CHROMGRNA in the last 8760 hours.  Assessment and Plan: H/o ischemic stroke with hemorrhagic conversion Patient admitted with sudden onset double vision, left-sided weakness, dizziness presented to [facility] ED for evaluation.  Thus far negative for acute stroke, however with history concerning for ongoing vascular abnormality.  IR consulted for diagnostic angiogram at the request of Dr. 05/11/21.  NPO p MN.  Patient and husband discussed with [facility]. [facility] at bedside. They are agreeable to proceed. Risks and benefits were discussed with the patient including, but not limited to bleeding, infection, vascular injury or contrast induced renal failure.  This interventional procedure involves the use of X-rays and because of the nature of the planned procedure, it is possible that we will have prolonged use of X-ray fluoroscopy.  Potential radiation risks to you include (but are not limited to) the following: - A slightly elevated risk for cancer  several years later in life. This risk is typically less than 0.5% percent. This risk is low in  comparison to the normal incidence of human cancer, which is 33% for women and 50% for men according to the American Cancer Society. - Radiation induced injury can include skin redness, resembling a rash, tissue breakdown / ulcers and hair loss (which can be temporary or permanent).   The likelihood of either of these occurring depends on the difficulty of the procedure and whether you are sensitive to radiation due to previous procedures, disease, or genetic conditions.   IF your procedure requires a prolonged use of radiation, you will be notified and given written instructions for further action.  It is your responsibility to monitor the irradiated area  for the 2 weeks following the procedure and to notify your physician if you are concerned that you have suffered a radiation induced injury.    All of the patient's questions were answered, patient is agreeable to proceed.  Consent signed and in chart.   Thank you for this interesting consult.  I greatly enjoyed meeting Judy Gutierrez and look forward to participating in their care.  A copy of this report was sent to the requesting provider on this date.  Electronically Signed: Hoyt KochKacie Sue-Ellen Isaid Salvia, PA 05/10/2021, 2:36 PM   I spent a total of 40 Minutes    in face to face in clinical consultation, greater than 50% of which was counseling/coordinating care for ischemic stroke.

## 2021-05-10 NOTE — Hospital Course (Signed)
60 year old woman PMH acute stroke with hemorrhagic conversion May 2022 who presented to the emergency department 7/24 after developing sudden onset dizziness, double vision, numbness and difficulty ambulating.  Was seen by teleneurology, code stroke called, symptoms quickly resolved and patient was admitted for further evaluation.

## 2021-05-10 NOTE — Assessment & Plan Note (Signed)
--  carb modified diet

## 2021-05-10 NOTE — Plan of Care (Signed)
Patient having no issues following a TIA. Admitted for a MRI following a CT that was normal.

## 2021-05-10 NOTE — Evaluation (Signed)
Occupational Therapy Evaluation Patient Details Name: Judy Gutierrez MRN: [id number] DOB: 07-05-1961 Today's Date: 05/10/2021    History of Present Illness 60 y.o. female presenting to ED 7/24 as code stroke with L-sided weakness, dizziness, double vision and difficulty ambulating with symptoms resolving afte 30 minutes. CT (-) for acute findings. 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   Clinical Impression   PTA patient was living with her spouse in a private residence and was grossly Mod I with ADLs/IADLs with use of SPC. Family provides transportation. Patient reports being active with [facility] therapies with last scheduled appointment for 7/25 with plan for transition to OP therapies. Patient with no further deficits compared to PTA demonstrating short-distance functional mobility, toileting/hygiene/clothing management and grooming standing at sink level with Min guard (no SPC available at time of evaluation). Patient continues to be limited by deficits listed below secondary to CVA 02/2021 including R sided weakness, decreased coordination and balance deficits. All further needs can be met in next level of care with recommendation for OPOT.     Follow Up Recommendations  Outpatient OT    Equipment Recommendations  None recommended by OT (Patient has necessary DME.)    Recommendations for Other Services       Precautions / Restrictions Precautions Precautions: Fall Restrictions Weight Bearing Restrictions: No      Mobility Bed Mobility Overal bed mobility: Modified Independent                  Transfers Overall transfer level: Needs assistance Equipment used: 1 person hand held assist Transfers: Sit to/from Stand Sit to Stand: Min guard         General transfer comment: Min guard without AD, no SPC available at time of eval.    Balance Overall balance assessment: Needs assistance Sitting-balance  support: Feet supported Sitting balance-Leahy Scale: Good     Standing balance support: Single extremity supported;During functional activity Standing balance-Leahy Scale: Poor Standing balance comment: Reliant on at least unilateral UE support for dynamic balance.                           ADL either performed or assessed with clinical judgement   ADL Overall ADL's : Needs assistance/impaired                         Toilet Transfer: Min Psychiatric nurse Details (indicate cue type and reason): Min guard without AD, no SPC available at time of eval Toileting- Water quality scientist and Hygiene: Min guard;Sit to/from stand Toileting - Clothing Manipulation Details (indicate cue type and reason): Toileting/hygiene/clothing management with Min guard for steadying/balance.     Functional mobility during ADLs: Min guard       Vision Baseline Vision/History: Wears glasses Patient Visual Report: No change from baseline Vision Assessment?: No apparent visual deficits     Perception     Praxis      Pertinent Vitals/Pain Pain Assessment: No/denies pain     Hand Dominance Right   Extremity/Trunk Assessment Upper Extremity Assessment Upper Extremity Assessment: RUE deficits/detail RUE Deficits / Details: AROM WFL; MMT grossly 4-/5 RUE Coordination: decreased fine motor;decreased gross motor   Lower Extremity Assessment Lower Extremity Assessment: Defer to PT evaluation   Cervical / Trunk Assessment Cervical / Trunk Assessment: Normal   Communication Communication Communication: No difficulties   Cognition Arousal/Alertness: Awake/alert Behavior During Therapy: [facility]  for tasks assessed/performed Overall Cognitive Status: Within Functional Limits for tasks assessed                                     General Comments  HR 124 max with grooming standing at sink level.    Exercises     Shoulder Instructions      Home Living  Family/patient expects to be discharged to:: Private residence Living Arrangements: Spouse/significant other Available Help at Discharge: Family Type of Home: House Home Access: Stairs to enter Technical brewer of Steps: 4 Entrance Stairs-Rails: Left;Right Home Layout: Two level;Bed/bath upstairs Alternate Level Stairs-Number of Steps: Flight Alternate Level Stairs-Rails: Left Bathroom Shower/Tub: Occupational psychologist: Standard Bathroom Accessibility: Yes   Home Equipment: Tub bench;Shower seat;Walker - 2 wheels;Cane - single point          Prior Functioning/Environment Level of Independence: Independent with assistive device(s)        Comments: SPC in for mobility in home and community dwellings; Mod I with ADLs using SPC, can make small meals for herself; family provides transportation. Reports being active with [facility] therapies with last day planned for 7/25. Patient reports plan to transition to OP therapies post d/c.        OT Problem List: Decreased strength;Impaired balance (sitting and/or standing);Decreased coordination;Impaired UE functional use      OT Treatment/Interventions:      OT Goals(Current goals can be found in the care plan section) Acute Rehab OT Goals Patient Stated Goal: To return home. OT Goal Formulation: With patient  OT Frequency:     Barriers to D/C:            Co-evaluation              AM-PAC OT "6 Clicks" Daily Activity     Outcome Measure Help from another person eating meals?: None Help from another person taking care of personal grooming?: A Little Help from another person toileting, which includes using toliet, bedpan, or urinal?: A Little Help from another person bathing (including washing, rinsing, drying)?: A Little Help from another person to put on and taking off regular upper body clothing?: A Little Help from another person to put on and taking off regular lower body clothing?: A Little 6 Click Score:  19   End of Session Nurse Communication: Mobility status;Other (comment) (Response to treatment)  Activity Tolerance: Patient tolerated treatment well Patient left: with call bell/phone within reach;in bed;with bed alarm set  OT Visit Diagnosis: Unsteadiness on feet (R26.81);Muscle weakness (generalized) (M62.81)                Time: 8288-3374 OT Time Calculation (min): 15 min Charges:  OT General Charges $OT Visit: 1 Visit OT Evaluation $OT Eval Low Complexity: 1 Low  Rishaan Gunner H. OTR/L Supplemental OT, Department of rehab services [phone]  Norina Cowper R H. 05/10/2021, 8:02 AM

## 2021-05-11 ENCOUNTER — Observation Stay (HOSPITAL_COMMUNITY): Payer: No Typology Code available for payment source

## 2021-05-11 ENCOUNTER — Ambulatory Visit (INDEPENDENT_AMBULATORY_CARE_PROVIDER_SITE_OTHER): Payer: No Typology Code available for payment source | Admitting: Neurology

## 2021-05-11 DIAGNOSIS — G459 Transient cerebral ischemic attack, unspecified: Secondary | ICD-10-CM

## 2021-05-11 DIAGNOSIS — I5032 Chronic diastolic (congestive) heart failure: Secondary | ICD-10-CM | POA: Diagnosis not present

## 2021-05-11 DIAGNOSIS — I7774 Dissection of vertebral artery: Secondary | ICD-10-CM

## 2021-05-11 HISTORY — PX: IR ANGIO VERTEBRAL SEL SUBCLAVIAN INNOMINATE BILAT MOD SED: IMG5366

## 2021-05-11 HISTORY — PX: IR ANGIO INTRA EXTRACRAN SEL COM CAROTID INNOMINATE BILAT MOD SED: IMG5360

## 2021-05-11 MED ORDER — MIDAZOLAM HCL 2 MG/2ML IJ SOLN
INTRAMUSCULAR | Status: AC
  Filled 2021-05-11: qty 2

## 2021-05-11 MED ORDER — LIDOCAINE HCL 1 % IJ SOLN
INTRAMUSCULAR | Status: AC
  Filled 2021-05-11: qty 20

## 2021-05-11 MED ORDER — HEPARIN SODIUM (PORCINE) 1000 UNIT/ML IJ SOLN
INTRAMUSCULAR | Status: AC | PRN
  Administered 2021-05-11: 1000 [IU] via INTRAVENOUS

## 2021-05-11 MED ORDER — FENTANYL CITRATE (PF) 100 MCG/2ML IJ SOLN
INTRAMUSCULAR | Status: AC | PRN
  Administered 2021-05-11: 25 ug via INTRAVENOUS

## 2021-05-11 MED ORDER — IOHEXOL 300 MG/ML  SOLN
100.0000 mL | Freq: Once | INTRAMUSCULAR | Status: AC | PRN
  Administered 2021-05-11: 75 mL via INTRA_ARTERIAL

## 2021-05-11 MED ORDER — CLOPIDOGREL BISULFATE 75 MG PO TABS
75.0000 mg | ORAL_TABLET | Freq: Every day | ORAL | 2 refills | Status: DC
Start: 2021-05-12 — End: 2021-08-19

## 2021-05-11 MED ORDER — MIDAZOLAM HCL 2 MG/2ML IJ SOLN
INTRAMUSCULAR | Status: AC | PRN
  Administered 2021-05-11: 1 mg via INTRAVENOUS

## 2021-05-11 MED ORDER — ATORVASTATIN CALCIUM 40 MG PO TABS: 80.0000 mg | ORAL_TABLET | Freq: Every evening | ORAL | Status: DC

## 2021-05-11 MED ORDER — IOHEXOL 300 MG/ML  SOLN
50.0000 mL | Freq: Once | INTRAMUSCULAR | Status: AC | PRN
  Administered 2021-05-11: 21 mL via INTRAVENOUS

## 2021-05-11 MED ORDER — HEPARIN SODIUM (PORCINE) 1000 UNIT/ML IJ SOLN
INTRAMUSCULAR | Status: AC
  Filled 2021-05-11: qty 1

## 2021-05-11 MED ORDER — LIDOCAINE HCL (PF) 1 % IJ SOLN
INTRAMUSCULAR | Status: AC | PRN
  Administered 2021-05-11: 10 mL

## 2021-05-11 MED ORDER — SODIUM CHLORIDE 0.9 % IV SOLN: INTRAVENOUS | Status: AC

## 2021-05-11 MED ORDER — FENTANYL CITRATE (PF) 100 MCG/2ML IJ SOLN
INTRAMUSCULAR | Status: AC
  Filled 2021-05-11: qty 2

## 2021-05-11 NOTE — Sedation Documentation (Signed)
Right femoral sheath removed, manual pressure being held at right femoral arterial puncture site by IR tech.

## 2021-05-11 NOTE — Progress Notes (Signed)
[facility] [address] [place] [place] [address] [phone]       OFFICE FOLLOW-UP NOTE  [name] Date of Birth:  09-23-1961 Medical Record Number:  [id number]   HPI: Initial visit 03/04/2020  :Ms. Hemler is a 60 year old [place] origin lady seen today for initial office follow-up visit following hospital consultation for stroke in April 2021.  She is accompanied by her husband.  History is obtained from them, review of electronic medical records and I personally reviewed imaging films in PACS.  She presented to med [facility] emergency room with sudden onset of dizziness, diplopia, gait imbalance and vomiting.  She was seen by telemetry neurologist and given IV TPA posterior circulation stroke.  She was transferred to [facility].  Initial CT scan of the head was unremarkable but MRI scan showed a small left superior cerebellar as well as medial midbrain infarct.  2D echo showed normal ejection fraction without cardiac source of embolism.  Cardiac monitoring during hospitalization did not show any paroxysmal A. fib.  Transcranial Doppler bubble study was negative for PFO.  LDL cholesterol is elevated 138 mg percent.  Hemoglobin A1c was borderline at 6.0.  Hypercoagulable labs and vasculitic labs were negative.  CT angiogram of the brain and neck showed no significant large vessel stenosis in the neck of the brain.  Patient was discharged home on aspirin and Plavix for 3 weeks and now is currently on aspirin alone.  Patient states she is doing well.  She is getting home physical and occupational therapy.  Her dizziness and gait imbalance have improved completely.  Double vision is also gone.  She had called the office few weeks ago complaining of eye twitching and some numbness on the right side and was wondering if this was related to the transcranial Doppler study that she had had.  The symptoms also have gone.  She is scheduled to undergo TEE  tomorrow followed by loop recorder.  She is tolerating aspirin well without bruising or bleeding.  She is also tolerating Lipitor well without muscle aches and pains.  Her blood pressure is well controlled.  She ran out of Cozaar  3 days ago but yet her blood pressure today is fine at 128/83. Update 08/19/2020 : She returns for follow-up after last visit 6 months ago.  She continues to do well.  She has had no recurrent stroke or TIA symptoms.  She is tolerating aspirin well without bruising or bleeding.  She remains on Lipitor and tolerating it well without muscle aches and pains.  She had follow-up lipid profile checked on 06/01/2020 and LDL cholesterol was 51 mg percent.  She had TEE done on 03/05/2020 which was unremarkable and she underwent loop recorder insertion.  So far monthly analysis has shown few episodes of transient A. fib which were felt to be artifact but no real A. fib was found.  She states she is recovered completely back to normal.  She has no complaints.  Her blood pressures well controlled today it is borderline at 134/68.  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 Update 05/11/2021 : Patient is seen emergently today for follow-up following recent admission for TIA.  She states that on 05/09/2021 when she bent down she noticed sudden onset of numbness involving left face as well as some dizziness and imbalance.  This did not last long and recovered.  She was admitted to  the hospital for TIA work-up and MRI scan of the brain did not show an acute stroke and encephalomalacia in bilateral cerebellum from a previous stroke.  CT angiogram of brain and neck was both unremarkable however she underwent diagnostic catheter angiogram today by Dr. 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 2.Focal circumferential narrowing of  the Lt VA at the level of  the dens also suspicious for a non flow limiting dissection..  Patient was on aspirin prior to admission and Plavix was added and she was discharged.  She has finished home physical occupational therapy and is planning to start outpatient therapy soon.  She states her dizziness and speech have improved.  Her strength is also a lot better with her balance and gait not back to baseline.  She is able to walk with a cane by herself but has to walk slowly and the right leg is stiff and slow.  She has had no falls or injuries.  She is also on amlodipine and blood pressure is under good control and Lipitor which is tolerating well without muscle aches and pains.  Her LDL was 41 mg percent and hemoglobin A1c 6.1 on 05/09/2021, she has a loop recorder in situ and so far paroxysmal A. fib has not yet been found. ROS:   14 system review of systems is positive for dizziness, imbalance, gait difficulty and all other systems negative and all systems negative PMH:  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)    Stroke (HCC)    Unspecified essential hypertension     Social History:  Social History   Socioeconomic History   Marital status: Married    Spouse name: Not on file   Number of children: 1   Years of education: Not on file   Highest education level: Not on file  Occupational History   Occupation: Science writer    Comment: self employed  Tobacco Use   Smoking status: Never   Smokeless tobacco: Never  Substance and Sexual Activity   Alcohol use: No   Drug use: No   Sexual activity: Yes  Other Topics Concern   Not on file  Social History Narrative   Lives with husband   Right Handed   Drinks caffeine seldom   Social Determinants of Health   Financial Resource Strain: Not on file  Food Insecurity: Not on file  Transportation Needs: Not on  file  Physical Activity: Not on file  Stress: Not on file  Social Connections: Not on file  Intimate Partner Violence: Not on file    Medications:   Current Outpatient Medications on File Prior to Visit  Medication Sig Dispense Refill   acetaminophen (TYLENOL) 325 MG tablet Take 325 mg by mouth every 6 (six) hours as needed for mild pain or headache.     amLODipine (NORVASC) 5 MG tablet Take 1 tablet (5 mg total) by mouth daily. 90 tablet 3   aspirin 81 MG EC tablet Take 1 tablet (81 mg total) by mouth daily. 90 tablet 1   atorvastatin (LIPITOR) 40 MG tablet Take 2 tablets (80 mg total) by mouth every evening.     Calcium Carbonate-Vit D-Min (CALCIUM 1200 PO) Take 1 tablet by mouth daily.     [START ON  05/12/2021] clopidogrel (PLAVIX) 75 MG tablet Take 1 tablet (75 mg total) by mouth daily. 30 tablet 2   Ferrous Sulfate (IRON PO) Take 1 tablet by mouth daily.     Multiple Vitamin (MULTIVITAMIN WITH MINERALS) TABS tablet Take 1 tablet by mouth daily.     Current Facility-Administered Medications on File Prior to Visit  Medication Dose Route Frequency Provider Last Rate Last Admin   0.9 %  sodium chloride infusion   Intravenous Continuous [name], MD 50 mL/hr at 05/11/21 0700 Infusion Verify at 05/11/21 0700   acetaminophen (TYLENOL) tablet 650 mg  650 mg Oral Q6H PRN Bobette Mo, MD       Or   acetaminophen (TYLENOL) suppository 650 mg  650 mg Rectal Q6H PRN Bobette Mo, MD       aspirin EC tablet 81 mg  81 mg Oral Daily Bobette Mo, MD   81 mg at 05/11/21 1036   atorvastatin (LIPITOR) tablet 40 mg  40 mg Oral Daily [name], MD   40 mg at 05/11/21 1036   clopidogrel (PLAVIX) tablet 75 mg  75 mg Oral Daily Suzan Garibaldi, PA-C   75 mg at 05/11/21 1036   lidocaine (XYLOCAINE) 1 % (with pres) injection            ondansetron (ZOFRAN) tablet 4 mg  4 mg Oral Q6H PRN Bobette Mo, MD       Or   ondansetron [facility] injection 4 mg  4 mg Intravenous Q6H  PRN Bobette Mo, MD       polyethylene glycol [facility]) packet 17 g  17 g Oral Daily PRN Bobette Mo, MD        Allergies:  No Known Allergies  Physical Exam General: Frail middle-aged Caucasian lady seated, in no evident distress Head: head normocephalic and atraumatic.  Neck: supple with no carotid or supraclavicular bruits Cardiovascular: regular rate and rhythm, no murmurs Musculoskeletal: no deformity Skin:  no rash/petichiae Vascular:  Normal pulses all extremities There were no vitals filed for this visit.  Neurologic Exam Mental Status: Awake and fully alert. Oriented to place and time. Recent and remote memory intact. Attention span, concentration and fund of knowledge appropriate. Mood and affect appropriate.  Cranial Nerves: Fundoscopic exam not done s. Pupils equal, briskly reactive to light. Extraocular movements full without nystagmus. Visual fields full to confrontation. Hearing intact. Facial sensation intact. Face, tongue, palate moves normally and symmetrically.  Motor: Normal bulk and tone. Normal strength in all tested extremity muscles.  Except diminished fine finger movements on the right and mild right grip weakness.  Trace right hip flexor and ankle dorsiflexor weakness.  Tone is increased on the right with some spasticity in the right leg. Sensory.: intact to touch ,pinprick .position and vibratory sensation.  Coordination: Rapid alternating movements are impaired on the right proportionate to degree of weakness.   Gait and Station: Arises from chair without difficulty. Stance is normal.  Mildly spastic ataxic gait with stiffness and dragging of the right leg but no foot drop..  Reflexes: 2+ and asymmetric and brisker on the right. Toes downgoing.   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  Posterior circulation TIA on 05/09/2021 likely symptomatic from partially healed  vertebral artery dissection.  Vascular risk factors of hyperlipidemia and vertebral artery dissection only     PLAN: I had a long d/w patient and her husband.  About her recent stroke, bilateral vertebral artery dissection risk for recurrent stroke/TIAs, personally independently reviewed imaging studies and stroke evaluation results and answered questions.I reviewed the results of diagnostic cerebral catheter angiogram today and agree with conservative medical management for now and dual antiplatelet therapy for 3 months.  Case discussed with Dr. Corliss Skains over the phone.  May consider angioplasty stenting if she has recurrent symptoms..Continue aspirin 81 mg and Plavix 75 mg daily  for secondary stroke prevention and maintain strict control of hypertension with blood pressure goal below 130/90, diabetes with hemoglobin A1c goal below 6.5% and lipids with LDL cholesterol goal below 70 mg/dL. I also advised the patient to eat a healthy diet with plenty of whole grains, cereals, fruits and vegetables, exercise regularly and maintain ideal body weight plan on repeating CT angiogram at next visit in 3 months to look for interval recanalization of the bilateral vertebral artery dissection followup in the future with me in 3 months or call earlier if necessary. Greater than 50% of time during this prolonged 45-minute isit was spent on counseling,explanation of diagnosis, of TIA and vertebral artery dissection planning of further management, discussion with patient and family and coordination of care Delia Heady, MD  [facility] [address] [place], [place] [address]  Phone [phone] Fax [phone] Note: This document was prepared with digital dictation and possible smart phrase  technology. Any transcriptional errors that result from this process are unintentional

## 2021-05-11 NOTE — TOC Transition Note (Signed)
Transition of Care [facility] - CM/SW Discharge Note   Patient Details  Name: Judy Gutierrez MRN: [id number] Date of Birth: Mar 17, 1961  Transition of [facility]) CM/SW Contact:  Kermit Balo, RN Phone Number: 05/11/2021, 11:47 AM   Clinical Narrative:    Patient discharging home with outpatient therapy arranged through [facility]. Information on the AVS.  Pt has all needed DME at home. She and spouse deny any issues with home medications or transportation. Spouse transporting patient home today.   Final next level of care: OP Rehab Barriers to Discharge: No Barriers Identified   Patient Goals and CMS Choice   CMS Medicare.gov Compare Post Acute Care list provided to:: Patient Choice offered to / list presented to : Patient, Spouse  Discharge Placement                       Discharge Plan and Services                                     Social Determinants of Health (SDOH) Interventions     Readmission Risk Interventions No flowsheet data found.

## 2021-05-11 NOTE — Procedures (Signed)
S/P4 vessel cerebral arteriogram RT CFA approach. Findings. 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 2.Focal circumferential narrowing of  the Lt VA at the level of the dens also suspicious for a non flow limiting dissection.  S.Justn Quale MD

## 2021-05-11 NOTE — Progress Notes (Signed)
   05/11/21 0830  RLE Neurovascular Assessment  R Posterior Tibial Pulse +2  R Dorsalis Pedis Pulse +2  LLE Neurovascular Assessment  L Posterior Tibial Pulse +2  L Dorsalis Pedis Pulse +2  First Vascular Site Assessment  #1 - Location of Site Assessment Right femoral  #1 - Vascular Site Assessment Scale Level 0  #1 - Hematoma present? No  #1 - Dressing Type (S)  Transparent dressing (Gauze, Vpad)  #1 - Dressing Status Clean;Dry;Intact   Report given to receiving nurse on 5 [address]. Nurse informed of straight leg time, pulses, and dressing (tegaderm, gauze, and v-pad). Nurse informed that v-pad will need to be removed before patient is discharged home.

## 2021-05-11 NOTE — Progress Notes (Addendum)
STROKE TEAM PROGRESS NOTE   INTERVAL HISTORY Her husband is at the bedside. Pt had cerebral angio this morning with Dr. Corliss Skains and found to have right VA flap consistent with dissection. Left VA narrowing with wall thickening concerning for previous dissection. Pt tolerating procedure well, now on DAPT and statin. She will follow up with Dr. [name] this pm.   Vitals:   05/11/21 0805 05/11/21 0810 05/11/21 0816 05/11/21 0900  BP: 140/81 (!) 143/82 (!) 144/80 (!) 132/94  Pulse: 78 79 93 73  Resp: 16 15 16    Temp:    98.2 F (36.8 C)  TempSrc:    Oral  SpO2: 100% 100% 100% 98%  Weight:      Height:       CBC:  Recent Labs  Lab 05/09/21 1716  WBC 8.4  NEUTROABS 4.9  HGB 12.4  HCT 36.5  MCV 89.5  PLT 235   Basic Metabolic Panel:  Recent Labs  Lab 05/09/21 1716  NA 135  K 3.9  CL 100  CO2 28  GLUCOSE 140*  BUN 16  CREATININE 0.72  CALCIUM 9.0    Lipid Panel:  Recent Labs  Lab 05/10/21 0118  CHOL 87  TRIG 16  HDL 43  CHOLHDL 2.0  VLDL 3  LDLCALC 41    HgbA1c:  Recent Labs  Lab 05/10/21 0118  HGBA1C 6.1*   Urine Drug Screen:  Recent Labs  Lab 05/09/21 1835  LABOPIA NONE DETECTED  COCAINSCRNUR NONE DETECTED  LABBENZ NONE DETECTED  AMPHETMU NONE DETECTED  THCU NONE DETECTED  LABBARB NONE DETECTED    Alcohol Level  Recent Labs  Lab 05/09/21 1716  ETH <10    IMAGING past 24 hours No results found.  PHYSICAL EXAM  Neuro: MS: awake, alert, and oriented to person, place and time. Speech/ language: fluent, comprehension intact, exam benign. Cranial Nerves: CN II :  Pearl. 50mm in siz CN III,IV, VI:  EOM intact, no gaze preference or deviation, no nystagmus CN V:   normal sensation in V1, V2, and V3 distribution bilaterally. CN VII:  normal hearing to speech  CN IX, X Normal palatal elevation; no uvular deviation CN XI:  5/5 head turn and 5/5 shoulder shrug CN XII: Midline tongue protrusion.   ASSESSMENT/PLAN [name] is a 60 y.o.  female with history of recent small left cerebellar stroke with petechial hemorrhage in 4/22 and large acute/right PICA territory stroke in May with hemorrhagic conversion who presents with recurrent episode of ataxia, vertigo, diplopia, numbness which lasted approximately 30 minutes.  She was in her normal state of health around 4:45 PM on 05/09/21, at which point she leaned over, and then had a sudden attack of double vision, left hand numbness, and vertigo.  She states that this lasted approximately 30 minutes and only resolved after arrival to the emergency department.  She was still symptomatic when going to CT, but after the time she got back she was back to her normal state.  She has right-sided ataxia since her previous strokes. CTA done of her head and neck were normal on arrival to ED.  Presenting NIHSS was 1.   MRI brain done shows no acute intracrnial abnormality, and reveals bilateral hemorrhagic encephalomalacia.  Posterior TIA - likely related to vertebral dissection CT no acute abnormality, chronic stroke on the right cerebellum.   CTA head and neck unremarkable.   MRI no acute infarct.   Cerebral angiogram - right VA flap consistent with dissection. Left VA  narrowing with wall thickening concerning for previous dissection. LDL 41 A1c 6.1 UDS negative. 2D Echo  5/10/222: LVEF 60% VTE prophylaxis - SCDs aspirin 81 mg daily prior to admission, now on aspirin 81 mg daily and clopidogrel 75 mg daily DAPT. Continue on discharge. Dr. [name] to decide on the duration of DAPT. Therapy recommendations:  outpt OT Disposition:  home   Hx of stroke stroke in 01/2020 with left cerebellar infarct, CTA head and neck unremarkable, TCD bubble study negative DVT negative, 2D echo unremarkable.  LDL 138, A1c 6.0, hypercoagulable work-up negative.  Discharged on DAPT for 3 weeks and Lipitor 40.  Had loop recorder placed 02/2020. 02/2021 admitted for right PICA large stroke and right SCA stroke and serial  CTs and MRIs showed hemorrhagic conversion, mild IVH and mild hydrocephalus.  Loop recorder interrogation no A. fib.  LDL 51, A1c 6.0, EF 60 to 96%.  Antiplatelet regimen changed from DAPT to single agent to hold off antiplatelet and eventually discharged with aspirin 81 and Lipitor 40.  Plan to have outpatient cerebral angiogram to evaluate posterior circulation.  Hypertension Home meds:  norvasc 5mg  daily Stable Long-term BP goal normotensive  Hyperlipidemia Home meds lipitor 40   LDL 41, goal < 70 Continue lipitor 40mg    Continue statin at discharge  Other Stroke Risk Factors  Hx stroke/TIA  Other Active Problems Education provided to avoid abrupt head movement or neck manipulation   Hospital day # 0  Neurology will sign off. Please call with questions. Pt will follow up with stroke clinic Dr. at [facility] today 3pm. Thanks for the consult.  [name], MD PhD Stroke Neurology 05/11/2021 10:27 AM   To contact Stroke Continuity provider, please refer to [name]. After hours, contact General Neurology

## 2021-05-11 NOTE — Progress Notes (Signed)
PT Cancellation Note  Patient Details Name: Judy Gutierrez MRN: [id number] DOB: [birth year]   Cancelled Treatment:    Reason Eval/Treat Not Completed: Medical issues which prohibited therapy Per chart, looks to have had femoral sheath removed this morning. Holding for now- will await clearance from medical team before initiating mobility (potentially later today if she is medically ready and time/schedule allows).   Madelaine Etienne, DPT, PN1   Supplemental Physical Therapist [facility]    Pager [phone] Acute Rehab Office [phone]

## 2021-05-11 NOTE — Discharge Summary (Signed)
Physician Discharge Summary  Judy Gutierrez TZG:[id number] DOB: 03-20-1961 DOA: 05/09/2021  PCP: [name], DO  Admit date: 05/09/2021 Discharge date: 05/11/2021  Recommendations for Outpatient Follow-up:  TIA   Follow-up Information     Judy Riley, MD. Go on 05/11/2021.   Specialties: Neurology, Radiology Why: stroke clinic Contact information: [address] [place] [address] [phone]         [facility] Follow up.   Specialty: Rehabilitation Why: The outpatient therapy will contact you for the first appointment. if you have not received a call by 7/27 pm please contact the rehab for an appointment. Contact information: [address] [facility] [phone] [address] [employer] [place] [address] [phone]               Discharge Diagnoses: Principal diagnosis is #1 Principal Problem:   TIA (transient ischemic attack) Active Problems:   Essential hypertension   Hyperlipidemia   Prediabetes   Chronic diastolic heart failure (grade 1 DD)  Discharge Condition: improved Disposition: home  Diet recommendation:  Diet Orders (From admission, onward)     Start     Ordered   05/11/21 0927  Diet Heart Room service appropriate? Yes; Fluid consistency: Thin  Diet effective now       Question Answer Comment  Room service appropriate? Yes   Fluid consistency: Thin      05/11/21 0926   05/11/21 0000  Diet - low sodium heart healthy        05/11/21 1146             Filed Weights   05/09/21 1656 05/09/21 1722 05/09/21 2251  Weight: 55.3 kg 57.9 kg 57.6 kg    HPI/Hospital Course:   60 year old woman PMH acute stroke with hemorrhagic conversion May 2022 who presented to the emergency department 7/24 after developing sudden onset dizziness, double vision, numbness and difficulty ambulating.  Was seen by teleneurology, code stroke called, symptoms quickly resolved and patient was admitted for  further evaluation.  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  Neurology recommended dual antiplatelet therapy and follow-up with Dr. [name] to determine the duration.   * TIA (transient ischemic attack) -- Symptoms resolved and imaging unremarkable.  -- Angiogram findings as below -- per neurology DAPT with aspirin 81 and Plavix 75 duration per Dr. [name].  Continue statin. --Outpatient PT, OT --avoid abrupt head movement or neck manipulation   Essential hypertension --stable. Amlodipine can resume on discharge.  Hyperlipidemia --continue atorvastatin  Chronic diastolic heart failure (grade 1 DD) --asymptomatic. Not on diuretic as outpatient. Appears euvolemic. Heart healthy diet. Monitor.  Prediabetes --carb modified diet   Significant Hospital Events   7/24 admit for TIA   Consults:  Neurology IR   Procedures:  Angiogram >  S/P4 vessel cerebral arteriogram RT CFA approach. Findings. 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 2.Focal circumferential narrowing of  the Lt VA at the level of the dens also suspicious for a non flow limiting dissection. S.[name]  Today's assessment: S: CC: f/u TIA  No new or recurrent symptoms.  Feels fine s/p angiogram.  O: Vitals:  Vitals:   05/11/21 0900 05/11/21 1314  BP: (!) 132/94 (!) 142/79  Pulse: 73 71  Resp:    Temp:  98.2 F (36.8 C) 98.2 F (36.8 C)  SpO2: 98% 100%    Constitutional:  Appears calm and comfortable Respiratory:  CTA bilaterally, no w/r/r.  Respiratory effort normal.  Cardiovascular:  RRR, no m/r/g Psychiatric:  Mental status Mood, affect appropriate  Hgb A1c 6.1 Angiogram noted  Discharge Instructions  Discharge Instructions      Ambulatory referral to Occupational Therapy   Complete by: As directed    Ambulatory referral to Physical Therapy   Complete by: As directed    Diet - low sodium heart healthy   Complete by: As directed    Discharge instructions   Complete by: As directed    Call your physician or seek immediate medical attention for weakness, dizziness, numbness, difficulty with vision, swallowing, speaking or worsening of condition.   Discharge wound care:   Complete by: As directed    Wound care as per Dr. [name]   Increase activity slowly   Complete by: As directed       Allergies as of 05/11/2021   No Known Allergies      Medication List     TAKE these medications    acetaminophen 325 MG tablet Commonly known as: TYLENOL Take 325 mg by mouth every 6 (six) hours as needed for mild pain or headache.   amLODipine 5 MG tablet Commonly known as: NORVASC Take 1 tablet (5 mg total) by mouth daily.   aspirin 81 MG EC tablet Take 1 tablet (81 mg total) by mouth daily.   atorvastatin 40 MG tablet Commonly known as: LIPITOR Take 2 tablets (80 mg total) by mouth every evening.   CALCIUM 1200 PO Take 1 tablet by mouth daily.   clopidogrel 75 MG tablet Commonly known as: PLAVIX Take 1 tablet (75 mg total) by mouth daily. Start taking on: May 12, 2021   IRON PO Take 1 tablet by mouth daily.   multivitamin with minerals Tabs tablet Take 1 tablet by mouth daily.               Discharge Care Instructions  (From admission, onward)           Start     Ordered   05/11/21 0000  Discharge wound care:       Comments: Wound care as per Dr. [name]   05/11/21 1146           No Known Allergies  The results of significant diagnostics from this hospitalization (including imaging, microbiology, ancillary and laboratory) are listed below for reference.    Significant Diagnostic Studies: CT ANGIO HEAD NECK W WO CM  Result Date: 05/09/2021 CLINICAL DATA:  Dizziness and  left facial tingling EXAM: CT ANGIOGRAPHY HEAD AND NECK TECHNIQUE: Multidetector CT imaging of the head and neck was performed using the standard protocol during bolus administration of intravenous contrast. Multiplanar CT image reconstructions and MIPs were obtained to evaluate the vascular anatomy. Carotid stenosis measurements (when applicable) are obtained utilizing NASCET criteria, using the distal internal carotid diameter as the denominator. CONTRAST:  80mL OMNIPAQUE IOHEXOL 350 MG/ML SOLN COMPARISON:  None. FINDINGS: CTA NECK FINDINGS SKELETON: There is no bony spinal canal stenosis. No lytic or blastic lesion. OTHER NECK: Normal pharynx, larynx and major salivary glands. No cervical lymphadenopathy. Unremarkable thyroid gland. UPPER CHEST: No pneumothorax or pleural effusion. No nodules or masses. AORTIC ARCH: There is no calcific atherosclerosis of the aortic arch. There is no aneurysm, dissection or hemodynamically significant stenosis of the visualized portion of the aorta. Conventional 3  vessel aortic branching pattern. The visualized proximal subclavian arteries are widely patent. RIGHT CAROTID SYSTEM: Normal without aneurysm, dissection or stenosis. LEFT CAROTID SYSTEM: Normal without aneurysm, dissection or stenosis. VERTEBRAL ARTERIES: Left dominant configuration. Both origins are clearly patent. There is no dissection, occlusion or flow-limiting stenosis to the skull base (V1-V3 segments). CTA HEAD FINDINGS POSTERIOR CIRCULATION: --Vertebral arteries: Normal V4 segments. --Inferior cerebellar arteries: Normal. --Basilar artery: Normal. --Superior cerebellar arteries: Normal. --Posterior cerebral arteries (PCA): Normal. ANTERIOR CIRCULATION: --Intracranial internal carotid arteries: Normal. --Anterior cerebral arteries (ACA): Normal. Both A1 segments are present. Patent anterior communicating artery (a-comm). --Middle cerebral arteries (MCA): Normal. VENOUS SINUSES: As permitted by contrast timing,  patent. ANATOMIC VARIANTS: Both posterior communicating arteries are patent. There is fetal origin of the right PCA. Review of the MIP images confirms the above findings. IMPRESSION: Normal CTA of the head and neck. Electronically Signed   By: Deatra Robinson M.D.   On: 05/09/2021 20:16   MR BRAIN W WO CONTRAST  Result Date: 05/10/2021 CLINICAL DATA:  Neuro deficit, acute, stroke suspected EXAM: MRI HEAD WITHOUT AND WITH CONTRAST TECHNIQUE: Multiplanar, multiecho pulse sequences of the brain and surrounding structures were obtained without and with intravenous contrast. CONTRAST:  5.35mL GADAVIST GADOBUTROL 1 MMOL/ML IV SOLN COMPARISON:  03/03/2021 FINDINGS: Brain: There is no acute infarction or intracranial hemorrhage. There is no intracranial mass, mass effect, or edema. There is no hydrocephalus or extra-axial fluid collection. Right greater than left cerebellar hemorrhagic encephalomalacia. Ventricles are stable in size. Chronic infarcts of the dorsal midbrain. Additional patchy foci of T2 hyperintensity in the supratentorial white matter are nonspecific but may reflect stable mild chronic microvascular ischemic changes. No abnormal enhancement. Vascular: Major vessel flow voids at the skull base are preserved. Skull and upper cervical spine: Normal marrow signal is preserved. Sinuses/Orbits: Left maxillary sinus circumferential mucosal thickening with some aerosolized secretions. Mild mucosal thickening elsewhere. Orbits are unremarkable. Other: Sella is unremarkable.  Mastoid air cells are clear. IMPRESSION: No acute intracranial abnormality. Right greater than left cerebellar hemorrhagic encephalomalacia. Mild chronic microvascular ischemic changes. Electronically Signed   By: Guadlupe Spanish M.D.   On: 05/10/2021 09:38   CUP PACEART REMOTE DEVICE CHECK  Result Date: 04/19/2021 ILR summary report received. Battery status OK. Normal device function. No new symptom, tachy, brady, or pause episodes. No new  AF episodes. Monthly summary reports and ROV/PRN Hassell Halim, RN, CCDS, [employer]  CT HEAD CODE STROKE WO CONTRAST  Result Date: 05/09/2021 CLINICAL DATA:  Code stroke. Neuro deficit, acute, stroke suspected. Diplopia, vertigo, last known well 1630. Slurred speech. EXAM: CT HEAD WITHOUT CONTRAST TECHNIQUE: Contiguous axial images were obtained from the base of the skull through the vertex without intravenous contrast. COMPARISON:  03/15/2021 FINDINGS: Brain: No acute infarct, mass, midline shift, or extra-axial fluid collection is identified. The lateral and third ventricles are normal in size. The right cerebellar hemorrhage on the prior study has resolved, and there is now extensive encephalomalacia throughout the right cerebellar hemisphere with encephalomalacia also noted in the vermis and superomedial left cerebellar hemisphere. There is ex vacuo dilatation of the fourth ventricle. Vascular: No hyperdense vessel. Skull: No fracture or suspicious osseous lesion. Sinuses/Orbits: Partially visualized mucosal thickening and secretions in the left maxillary and left sphenoid sinuses. Clear mastoid air cells. Unremarkable orbits. Other: None. ASPECTS [facility] Stroke Program Early CT Score) - Ganglionic level infarction (caudate, lentiform nuclei, internal capsule, insula, M1-M3 cortex): 7 - Supraganglionic infarction (M4-M6 cortex): 3 Total score (0-10 with  10 being normal): 10 IMPRESSION: 1. No evidence of acute intracranial abnormality. ASPECTS of 10. 2. Expected evolution of hemorrhagic cerebellar infarct with resolved hemorrhage and development of extensive encephalomalacia. These results were called by telephone at the time of interpretation on 05/09/2021 at 5:36 pm to Dr. Tilden Fossa, who verbally acknowledged these results. Electronically Signed   By: Sebastian Ache M.D.   On: 05/09/2021 17:37    Microbiology: Recent Results (from the past 240 hour(s))  Resp Panel by RT-PCR (Flu A&B, Covid)  Nasopharyngeal Swab     Status: None   Collection Time: 05/09/21  5:30 PM   Specimen: Nasopharyngeal Swab; Nasopharyngeal(NP) swabs in vial transport medium  Result Value Ref Range Status   SARS Coronavirus 2 by RT PCR NEGATIVE NEGATIVE Final    Comment: (NOTE) SARS-CoV-2 target nucleic acids are NOT DETECTED.  The SARS-CoV-2 RNA is generally detectable in upper respiratory specimens during the acute phase of infection. The lowest concentration of SARS-CoV-2 viral copies this assay can detect is 138 copies/mL. A negative result does not preclude SARS-Cov-2 infection and should not be used as the sole basis for treatment or other patient management decisions. A negative result may occur with  improper specimen collection/handling, submission of specimen other than nasopharyngeal swab, presence of viral mutation(s) within the areas targeted by this assay, and inadequate number of viral copies(<138 copies/mL). A negative result must be combined with clinical observations, patient history, and epidemiological information. The expected result is Negative.  Fact Sheet for Patients:  BloggerCourse.com  Fact Sheet for Healthcare Providers:  SeriousBroker.it  This test is no t yet approved or cleared by the [place] FDA and  has been authorized for detection and/or diagnosis of SARS-CoV-2 by FDA under an Emergency Use Authorization (EUA). This EUA will remain  in effect (meaning this test can be used) for the duration of the COVID-19 declaration under Section 564(b)(1) of the Act, 21 U.S.C.section 360bbb-3(b)(1), unless the authorization is terminated  or revoked sooner.       Influenza A by PCR NEGATIVE NEGATIVE Final   Influenza B by PCR NEGATIVE NEGATIVE Final    Comment: (NOTE) The Xpert Xpress SARS-CoV-2/FLU/RSV plus assay is intended as an aid in the diagnosis of influenza from Nasopharyngeal swab specimens and should not be  used as a sole basis for treatment. Nasal washings and aspirates are unacceptable for Xpert Xpress SARS-CoV-2/FLU/RSV testing.  Fact Sheet for Patients: BloggerCourse.com  Fact Sheet for Healthcare Providers: SeriousBroker.it  This test is not yet approved or cleared by the [place] FDA and has been authorized for detection and/or diagnosis of SARS-CoV-2 by FDA under an Emergency Use Authorization (EUA). This EUA will remain in effect (meaning this test can be used) for the duration of the COVID-19 declaration under Section 564(b)(1) of the Act, 21 U.S.C. section 360bbb-3(b)(1), unless the authorization is terminated or revoked.  Performed at [facility], [address] [place], [place] [address]      Labs: Basic Metabolic Panel: Recent Labs  Lab 05/09/21 1716  NA 135  K 3.9  CL 100  CO2 28  GLUCOSE 140*  BUN 16  CREATININE 0.72  CALCIUM 9.0   Liver Function Tests: Recent Labs  Lab 05/09/21 1716  AST 38  ALT 37  ALKPHOS 79  BILITOT 1.0  PROT 7.9  ALBUMIN 3.9   CBC: Recent Labs  Lab 05/09/21 1716  WBC 8.4  NEUTROABS 4.9  HGB 12.4  HCT 36.5  MCV 89.5  PLT 235   CBG: Recent Labs  Lab 05/09/21 1733  GLUCAP 117*    Principal Problem:   TIA (transient ischemic attack) Active Problems:   Essential hypertension   Hyperlipidemia   Prediabetes   Chronic diastolic heart failure (grade 1 DD)   Time coordinating discharge: 20 minutes  Signed:  Brendia Sacks, MD  [facility]  05/11/2021, 5:11 PM

## 2021-05-12 ENCOUNTER — Telehealth: Payer: Self-pay | Admitting: Family Medicine

## 2021-05-12 NOTE — Telephone Encounter (Signed)
Patient would like to know if she could take ensure

## 2021-05-12 NOTE — Telephone Encounter (Signed)
Pt.notified

## 2021-05-13 ENCOUNTER — Other Ambulatory Visit: Payer: Self-pay

## 2021-05-13 ENCOUNTER — Telehealth: Payer: Self-pay | Admitting: *Deleted

## 2021-05-13 ENCOUNTER — Ambulatory Visit: Payer: No Typology Code available for payment source | Attending: Physical Medicine & Rehabilitation

## 2021-05-13 DIAGNOSIS — Z8673 Personal history of transient ischemic attack (TIA), and cerebral infarction without residual deficits: Secondary | ICD-10-CM | POA: Diagnosis not present

## 2021-05-13 DIAGNOSIS — R2681 Unsteadiness on feet: Secondary | ICD-10-CM | POA: Insufficient documentation

## 2021-05-13 DIAGNOSIS — R260 Ataxic gait: Secondary | ICD-10-CM

## 2021-05-13 DIAGNOSIS — M6281 Muscle weakness (generalized): Secondary | ICD-10-CM | POA: Diagnosis present

## 2021-05-13 NOTE — Telephone Encounter (Signed)
Letter for patient on MD's desk for review, signature.

## 2021-05-13 NOTE — Patient Instructions (Signed)
HEP to be completed with family close supervision  Access Code: [email or web address] URL: [email or web address] Date: 05/13/2021 Prepared by: Claude Manges  Exercises Side Stepping with Counter Support - 1 x daily - 7 x weekly - 5 sets Backward Walking with Counter Support - 1 x daily - 7 x weekly - 5 sets Heel Raises with Counter Support - 1 x daily - 7 x weekly - 2 sets - 15 reps Standing March with Counter Support - 1 x daily - 7 x weekly - 2 sets - 15 reps Standing Hip Abduction with Counter Support - 1 x daily - 7 x weekly - 2 sets - 15 reps Standing Hip Extension with Counter Support - 1 x daily - 7 x weekly - 2 sets - 15 reps Sit to Stand - 1 x daily - 7 x weekly - 2 sets - 15 reps

## 2021-05-13 NOTE — Telephone Encounter (Signed)
Letter signed, mailed with AVS to patient per Dr [name].

## 2021-05-13 NOTE — Therapy (Signed)
[facility] [address] [place], [place], [address] Phone: [phone]   Fax:  [phone]  Physical Therapy Evaluation  Patient Details  Name: Judy Gutierrez MRN: [id number] Date of Birth: Feb 16, 1961 Referring Provider (PT): Kirsteins   Encounter Date: 05/13/2021   PT End of Session - 05/13/21 1558     Visit Number 1    Date for PT Re-Evaluation 08/05/21    PT Start Time 1513    PT Stop Time 1550    PT Time Calculation (min) 37 min    Activity Tolerance Patient tolerated treatment well    Behavior During Therapy WFL for tasks assessed/performed             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)    Stroke [facility])    Unspecified essential hypertension     Past Surgical History:  Procedure Laterality Date   BUBBLE STUDY  03/05/2020   Procedure: BUBBLE STUDY;  Surgeon: [name], MD;  Location: [facility] ENDOSCOPY;  Service: Cardiovascular;;   CESAREAN SECTION  1992   IR ANGIO INTRA EXTRACRAN SEL COM CAROTID INNOMINATE BILAT MOD SED  05/11/2021   IR ANGIO VERTEBRAL SEL SUBCLAVIAN INNOMINATE BILAT MOD SED  05/11/2021   LOOP RECORDER INSERTION N/A 03/05/2020   Procedure: LOOP RECORDER INSERTION;  Surgeon: [name], MD;  Location: [facility] INVASIVE CV LAB;  Service: Cardiovascular;  Laterality: N/A;   TEE WITHOUT CARDIOVERSION N/A 03/05/2020   Procedure: TRANSESOPHAGEAL ECHOCARDIOGRAM (TEE);  Surgeon: [name], MD;  Location: [facility] ENDOSCOPY;  Service: Cardiovascular;  Laterality: N/A;    There were no vitals filed for this visit.    Subjective Assessment - 05/13/21 1511     Subjective 60 y.o. female presenting to ED 7/24 as code stroke with L-sided weakness, dizziness, double vision and difficulty ambulating with symptoms resolving afte 30 minutes. CT (-)  for acute findings. 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 Reports cerebllar changes.  Discharged tuesday afternoon around 1:30 pm. Currently walkign with [facility] and HHA of family. does use walker especially in the morning. Reports angiography on the right side.    Patient Stated Goals improve balance, strength.    Currently in Pain? Yes    Pain Score --   5/10 right shoulder and right hip from angiography site.               [facility] PT Assessment - 05/13/21 1510       Assessment   Medical Diagnosis I63.9 (ICD-10-CM) - Cerebellar stroke [facility])    Referring Provider (PT) Kirsteins    Hand Dominance Right      Balance Screen   Has the patient fallen in the past 6 months No    Has the patient had a decrease in activity level because of a fear of falling?  No    Is the patient reluctant to leave their home because of a fear of falling?  Yes      Home Environment   Additional Comments 5 steps to get in. bilevel with 1 flight of steps. with 1 sided railing that is used.      Sensation   Light Touch Appears Intact      Coordination   Gross  Motor Movements are Fluid and Coordinated --   slight shakyiness on the right   Fine Motor Movements are Fluid and Coordinated --   supination on the right slow, shaky  less coordinated than left. incoordination of right hand.     ROM / Strength   AROM / PROM / Strength Strength;AROM      AROM   Overall AROM Comments Symmetrical B shoulders and elbows, diminished for right forearm supination. Symmetrical ROM BLE      Strength   Overall Strength Comments BLE strength grossly 3+/5 to 4-/5      Ambulation/Gait   Stairs Yes    Stairs Assistance 6: Modified independent (Device/Increase time)   1 to 2 HR, CS, step to with cues needed for pacing, 4 & 6 inch stairs   Gait Comments slow, ataxic, with SPC on the left and HHA on the right       Standardized Balance Assessment   Standardized Balance Assessment Berg Balance Test;Timed Up and Go Test;Five Times Sit to Stand    Five times sit to stand comments  20 seconds, hands on thighs, shaky/ataxic      Berg Balance Test   Sit to Stand Able to stand  independently using hands    Standing Unsupported Needs several tries to stand 30 seconds unsupported    Sitting with Back Unsupported but Feet Supported on Floor or Stool Able to sit safely and securely 2 minutes    Stand to Sit Controls descent by using hands    Transfers Able to transfer safely, definite need of hands    Standing Unsupported with Eyes Closed Needs help to keep from falling    Standing Unsupported with Feet Together Needs help to attain position and unable to hold for 15 seconds    From Standing, Reach Forward with Outstretched Arm Reaches forward but needs supervision    From Standing Position, Pick up Object from Floor Unable to pick up and needs supervision    From Standing Position, Turn to Look Behind Over each Shoulder Needs assist to keep from losing balance and falling    Turn 360 Degrees Needs assistance while turning    Standing Unsupported, Alternately Place Feet on Step/Stool Needs assistance to keep from falling or unable to try    Standing Unsupported, One Foot in [employer] balance while stepping or standing    Standing on One Leg Unable to try or needs assist to prevent fall    Total Score 16      Timed Up and Go Test   Normal TUG (seconds) 32   with SPC and CS/SBA                       Objective measurements completed on examination: See above findings.               PT Education - 05/13/21 1557     Education Details PT initial POC and HEP. HEP to be completed by kitchen counter, walking activities with UE support on counter and family close by.    Person(s) Educated Patient;Spouse    Methods Explanation;Demonstration;Handout    Comprehension Verbalized  understanding;Returned demonstration              PT Short Term Goals - 05/13/21 1822       PT SHORT TERM GOAL #1   Title Independent with initial HEP with family SBA for safety    Time 2    Period Weeks  Status New    Target Date 05/27/21               PT Long Term Goals - 05/13/21 1822       PT LONG TERM GOAL #1   Title Independent with advanced HEP    Time 12    Period Weeks    Status New    Target Date 08/05/21      PT LONG TERM GOAL #2   Title Berg improved to at least 48/56 to demonstrate decreased falls risk    Time 12    Period Weeks    Status New    Target Date 08/05/21      PT LONG TERM GOAL #3   Title TUG improved to </= 10 seconds with SPC or independent, no LOB to demonstrate improved functional gait speed and decreased falls risk    Time 12    Period Weeks    Status New    Target Date 08/05/21      PT LONG TERM GOAL #4   Title Improved BUE and BLE strength to at least 4+/5    Time 12    Period Weeks    Status New    Target Date 08/05/21                    Plan - 05/13/21 1558     Clinical Impression Statement Pt is a 60 yo female with recent history of 2 strokes and  possible TIA most recently with hospitalization, referred to OPPT for cerebellar stroke. She currently presents with grossly diminished strength, core stability, balance, and diminished coordination with ataxia.  She is a falls risk at this time. As a result gait is unsteady and requires min A/min guarding for safety with ambulation. She also demosntrates poor fine motor control/strength and functional right hand movements although she is right hand dominant.  She will benefit from skilled PT to address aforementioned impairments, improve strength mobility and decrease falls risk.    Stability/Clinical Decision Making Evolving/Moderate complexity    Clinical Decision Making Moderate    Rehab Potential Good    PT Frequency 2x / week    PT Duration 12 weeks    PT  Treatment/Interventions ADLs/Self Care Home Management;Cryotherapy;Electrical Stimulation;Iontophoresis 4mg /ml Dexamethasone;Moist Heat;Functional mobility training;Therapeutic activities;Therapeutic exercise;Balance training;Neuromuscular re-education;Patient/family education;Manual techniques;Energy conservation;Vestibular;Canalith Repostioning;Gait training;Stair training    PT Next Visit Plan Reassess HEp and progress as tolerated. BUE/LE and core strengthening, gait/balance stability. Manual/modalities as needed to facilitate progress.    PT Home Exercise Plan see pt instructions    Recommended Other Services OT and ST eval recommended and orders in system however not currently covered by insurance.    Consulted and Agree with Plan of Care Patient;Family member/caregiver    Family Member Consulted husband             Patient will benefit from skilled therapeutic intervention in order to improve the following deficits and impairments:  Abnormal gait, Decreased range of motion, Difficulty walking, Impaired UE functional use, Increased muscle spasms, Decreased endurance, Pain, Improper body mechanics, Decreased balance, Decreased strength, Decreased mobility  Visit Diagnosis: History of CVA (cerebrovascular accident) - Plan: PT plan of care cert/re-cert  Muscle weakness (generalized) - Plan: PT plan of care cert/re-cert  Ataxic gait - Plan: PT plan of care cert/re-cert  Unsteadiness on feet - Plan: PT plan of care cert/re-cert     Problem List Patient Active Problem List   Diagnosis Date Noted  TIA (transient ischemic attack) 05/09/2021   Chronic diastolic heart failure (grade 1 DD) 05/09/2021   Primary hypertension    Obstructive hydrocephalus (HCC)    Tachycardia    Acute cerebral infarction [facility](HCC)    Visual disturbance as complication of stroke    Transaminitis    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 coli UTI 11/01/2013   Anemia due to blood loss, acute 10/24/2013   Pelvic fracture (HCC) 10/23/2013   Hyperglycemia 09/18/2013   Routine general medical examination at a health care facility 09/18/2013   Elevated alkaline phosphatase level 09/18/2013   Hot flashes, menopausal 04/10/2013   Pain in joint, upper arm 04/10/2013    Anson CroftsMonica L Ashley Bultema, PT, DPT 05/13/2021, 6:45 PM  [facility] [address] [place], [place], [id number] Phone: [phone]   Fax:  [phone]  Name: Iva BoopShilpa Kovatch MRN: [id number] Date of Birth: 05-Jun-1961

## 2021-05-17 ENCOUNTER — Ambulatory Visit
Payer: No Typology Code available for payment source | Attending: Physical Medicine & Rehabilitation | Admitting: Physical Therapy

## 2021-05-17 ENCOUNTER — Encounter: Payer: Self-pay | Admitting: Physical Therapy

## 2021-05-17 ENCOUNTER — Other Ambulatory Visit: Payer: Self-pay

## 2021-05-17 DIAGNOSIS — M6281 Muscle weakness (generalized): Secondary | ICD-10-CM | POA: Diagnosis present

## 2021-05-17 DIAGNOSIS — R260 Ataxic gait: Secondary | ICD-10-CM

## 2021-05-17 DIAGNOSIS — R2681 Unsteadiness on feet: Secondary | ICD-10-CM | POA: Insufficient documentation

## 2021-05-17 DIAGNOSIS — R41841 Cognitive communication deficit: Secondary | ICD-10-CM | POA: Insufficient documentation

## 2021-05-17 DIAGNOSIS — R471 Dysarthria and anarthria: Secondary | ICD-10-CM | POA: Insufficient documentation

## 2021-05-17 DIAGNOSIS — R278 Other lack of coordination: Secondary | ICD-10-CM | POA: Diagnosis present

## 2021-05-17 DIAGNOSIS — R29818 Other symptoms and signs involving the nervous system: Secondary | ICD-10-CM | POA: Insufficient documentation

## 2021-05-17 DIAGNOSIS — Z8673 Personal history of transient ischemic attack (TIA), and cerebral infarction without residual deficits: Secondary | ICD-10-CM | POA: Diagnosis not present

## 2021-05-17 NOTE — Therapy (Signed)
[facility] [address] [place], [place], [address] Phone: [phone]   Fax:  [phone]  Physical Therapy Treatment  Patient Details  Name: Judy Gutierrez MRN: [id number] Date of Birth: [birth year] Referring Provider (PT): Kirsteins   Encounter Date: 05/17/2021   PT End of Session - 05/17/21 1747     Visit Number 2    Date for PT Re-Evaluation 08/05/21    PT Start Time 1657    PT Stop Time 1743    PT Time Calculation (min) 46 min    Equipment Utilized During Treatment Gait belt    Activity Tolerance Patient tolerated treatment well    Behavior During Therapy WFL for tasks assessed/performed             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)    Stroke (HCC)    Unspecified essential hypertension     Past Surgical History:  Procedure Laterality Date   BUBBLE STUDY  03/05/2020   Procedure: BUBBLE STUDY;  Surgeon: [name], MD;  Location: [facility] ENDOSCOPY;  Service: Cardiovascular;;   CESAREAN SECTION  1992   IR ANGIO INTRA EXTRACRAN SEL COM CAROTID INNOMINATE BILAT MOD SED  05/11/2021   IR ANGIO VERTEBRAL SEL SUBCLAVIAN INNOMINATE BILAT MOD SED  05/11/2021   LOOP RECORDER INSERTION N/A 03/05/2020   Procedure: LOOP RECORDER INSERTION;  Surgeon: [name], MD;  Location: [facility] INVASIVE CV LAB;  Service: Cardiovascular;  Laterality: N/A;   TEE WITHOUT CARDIOVERSION N/A 03/05/2020   Procedure: TRANSESOPHAGEAL ECHOCARDIOGRAM (TEE);  Surgeon: [name], MD;  Location: [facility] ENDOSCOPY;  Service: Cardiovascular;  Laterality: N/A;    There were no vitals filed for this visit.   Subjective Assessment - 05/17/21 1700     Subjective Doing pretty well. Has some pain at the base of her skull d/t her angioplasty. Feeling better today. Has been doing her HEP  2x/day. Denies falls since last session.    Patient is accompained by: Family member   husband   Patient Stated Goals improve balance, strength.    Currently in Pain? No/denies                               [facility] Adult PT Treatment/Exercise - 05/17/21 0001       Neuro Re-ed    Neuro Re-ed Details  R wt shift + L step on 4" 10x      Exercises   Exercises Knee/Hip      Knee/Hip Exercises: Stretches   Gastroc Stretch Both;1 rep;30 seconds    Gastroc Stretch Limitations B UE support      Knee/Hip Exercises: Aerobic   Nustep L6 x 6 min (UEs/LEs)      Knee/Hip Exercises: Seated   Sit to Sand 1 set;10 reps;without UE support   manual pressure on R hip for wt shift                Balance Exercises - 05/17/21 0001       Balance Exercises: Standing   Standing Eyes Opened 1 rep;Narrow base of support (BOS);Foam/compliant surface;30 secs    Standing Eyes Closed Narrow base of support (BOS);2 reps;30 secs;Foam/compliant surface   min A required for imbalance   Tandem Stance 2 reps;30 secs  1/4 tandem   Heel Raises Both;15 reps   B heel/toe raise intermittent UE support   Other Standing Exercises R SLS + rolling yellow medball under L foot 2x15   in parallel bars   Other Standing Exercises Comments sidestepping and bckwards walking 4x length of parallel bars,               PT Education - 05/17/21 1745     Education Details update to HEP- to be performed at counter top and with husband supervision    Person(s) Educated Patient;Spouse    Methods Explanation;Demonstration;Tactile cues;Verbal cues;Handout    Comprehension Verbalized understanding;Returned demonstration              PT Short Term Goals - 05/17/21 1751       PT SHORT TERM GOAL #1   Title Independent with initial HEP with family SBA for safety    Time 2    Period Weeks    Status Achieved    Target Date 05/27/21               PT Long Term Goals - 05/17/21 1751        PT LONG TERM GOAL #1   Title Independent with advanced HEP    Time 12    Period Weeks    Status On-going      PT LONG TERM GOAL #2   Title Berg improved to at least 48/56 to demonstrate decreased falls risk    Time 12    Period Weeks    Status On-going      PT LONG TERM GOAL #3   Title TUG improved to </= 10 seconds with SPC or independent, no LOB to demonstrate improved functional gait speed and decreased falls risk    Time 12    Period Weeks    Status On-going      PT LONG TERM GOAL #4   Title Improved BUE and BLE strength to at least 4+/5    Time 12    Period Weeks    Status On-going                   Plan - 05/17/21 1748     Clinical Impression Statement Patient arrived to session with husband. Patient reports compliance with HEP. Denies falls since last session. Worked on STS transfers with manual assistance to increase R weight shift as patient demonstrates a L LE bias. Performed standing pre-gait and balance activities with and without UE support. Patient required cues to avoid excessive UE use with balance activities d/t fear of falling. Patient today with most difficulty performing R SLS, weight shifting on compliant surface, and without visual fixation. Updated HEP with exercises that were well-tolerated today and advised patient to perform at counter with husband supervision for safety. Both reported understanding and without complaints at end of session.    PT Treatment/Interventions ADLs/Self Care Home Management;Cryotherapy;Electrical Stimulation;Iontophoresis 4mg /ml Dexamethasone;Moist Heat;Functional mobility training;Therapeutic activities;Therapeutic exercise;Balance training;Neuromuscular re-education;Patient/family education;Manual techniques;Energy conservation;Vestibular;Canalith Repostioning;Gait training;Stair training    PT Next Visit Plan BUE/LE and core strengthening, gait/balance stability. Manual/modalities as needed to facilitate progress.     Consulted and Agree with Plan of Care Patient;Family member/caregiver    Family Member Consulted husband             Patient will benefit from skilled therapeutic intervention in order to improve the following deficits and impairments:  Abnormal gait, Decreased range of motion, Difficulty walking, Impaired UE functional use, Increased muscle spasms, Decreased  endurance, Pain, Improper body mechanics, Decreased balance, Decreased strength, Decreased mobility  Visit Diagnosis: History of CVA (cerebrovascular accident)  Muscle weakness (generalized)  Ataxic gait  Unsteadiness on feet     Problem List Patient Active Problem List   Diagnosis Date Noted   TIA (transient ischemic attack) 05/09/2021   Chronic diastolic heart failure (grade 1 DD) 05/09/2021   Primary hypertension    Obstructive hydrocephalus (HCC)    Tachycardia    Acute cerebral infarction [facility])    Visual disturbance as complication of stroke    Transaminitis    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 coli UTI 11/01/2013   Anemia due to blood loss, acute 10/24/2013   Pelvic fracture (HCC) 10/23/2013   Hyperglycemia 09/18/2013   Routine general medical examination at a health care facility 09/18/2013   Elevated alkaline phosphatase level 09/18/2013   Hot flashes, menopausal 04/10/2013   Pain in joint, upper arm 04/10/2013     Anette Guarneri, PT, DPT 05/17/21 5:53 PM    [facility] [address] [place], [place], [address] Phone: [phone]   Fax:  [phone]  Name: Sanna Porcaro MRN: [id number] Date of Birth: 06/14/1961

## 2021-05-18 ENCOUNTER — Telehealth: Payer: Self-pay | Admitting: Neurology

## 2021-05-18 LAB — CUP PACEART REMOTE DEVICE CHECK
Date Time Interrogation Session: 20220801230424
Implantable Pulse Generator Implant Date: 20210520

## 2021-05-18 NOTE — Telephone Encounter (Signed)
Returned patient's call.  Patient's husband was concerned that if the appointment needed to be right at 3 months from 07/26.  Was able to offer them appointment first week of November.  He was very happy with this time.  Also requested letter to be printed and picked up tomorrow morning.  He was concerned that he had not received letter yet.    Patient denied further questions, verbalized understanding and expressed appreciation for the phone call.

## 2021-05-18 NOTE — Telephone Encounter (Signed)
Pt's husband, [name] (on [place]) called, need an appt for 3 month f/u due to her having a procedure done. Would like to speak with the nurse to discuss appt sooner than November.

## 2021-05-19 ENCOUNTER — Ambulatory Visit (INDEPENDENT_AMBULATORY_CARE_PROVIDER_SITE_OTHER): Payer: No Typology Code available for payment source

## 2021-05-19 DIAGNOSIS — I639 Cerebral infarction, unspecified: Secondary | ICD-10-CM

## 2021-05-20 ENCOUNTER — Telehealth: Payer: Self-pay | Admitting: Neurology

## 2021-05-20 NOTE — Telephone Encounter (Signed)
Pt husband has a question for Dr [name] about his wife's weight loss. Please call

## 2021-05-20 NOTE — Telephone Encounter (Addendum)
I spoke to the patient's husband. He plans to supplement his wife's diet with either Ensure or Boost. I recommended he purchase either Ensure Diabetes Care or Boost Glucose Control due to her past elevated A1C readings. She should continue eating healthy, well balanced meals. He verbalized understanding of this information.

## 2021-05-21 ENCOUNTER — Other Ambulatory Visit: Payer: Self-pay

## 2021-05-21 ENCOUNTER — Ambulatory Visit: Payer: No Typology Code available for payment source | Admitting: Occupational Therapy

## 2021-05-21 ENCOUNTER — Encounter: Payer: Self-pay | Admitting: Speech Pathology

## 2021-05-21 ENCOUNTER — Ambulatory Visit: Payer: No Typology Code available for payment source

## 2021-05-21 ENCOUNTER — Ambulatory Visit: Payer: No Typology Code available for payment source | Admitting: Speech Pathology

## 2021-05-21 ENCOUNTER — Encounter: Payer: Self-pay | Admitting: Occupational Therapy

## 2021-05-21 DIAGNOSIS — R278 Other lack of coordination: Secondary | ICD-10-CM

## 2021-05-21 DIAGNOSIS — Z8673 Personal history of transient ischemic attack (TIA), and cerebral infarction without residual deficits: Secondary | ICD-10-CM

## 2021-05-21 DIAGNOSIS — R41841 Cognitive communication deficit: Secondary | ICD-10-CM

## 2021-05-21 DIAGNOSIS — R471 Dysarthria and anarthria: Secondary | ICD-10-CM

## 2021-05-21 DIAGNOSIS — R260 Ataxic gait: Secondary | ICD-10-CM

## 2021-05-21 DIAGNOSIS — R2681 Unsteadiness on feet: Secondary | ICD-10-CM

## 2021-05-21 DIAGNOSIS — M6281 Muscle weakness (generalized): Secondary | ICD-10-CM

## 2021-05-21 DIAGNOSIS — R29818 Other symptoms and signs involving the nervous system: Secondary | ICD-10-CM

## 2021-05-21 NOTE — Therapy (Signed)
[facility] [place] [facility] [address] [place], [place], [address] Phone: [phone]   Fax:  [phone]  Speech Language Pathology Evaluation  Patient Details  Name: Judy Gutierrez MRN: [id number] Date of Birth: [birth year] Referring Provider (SLP): Areatha Keas MD   Encounter Date: 05/21/2021   End of Session - 05/21/21 1056     Visit Number 1    Number of Visits 3    Date for SLP Re-Evaluation 06/21/21    SLP Start Time 0925    SLP Stop Time  1005    SLP Time Calculation (min) 40 min    Activity Tolerance Patient tolerated treatment well             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)    Stroke [facility])    Unspecified essential hypertension     Past Surgical History:  Procedure Laterality Date   BUBBLE STUDY  03/05/2020   Procedure: BUBBLE STUDY;  Surgeon: [name], MD;  Location: [facility] ENDOSCOPY;  Service: Cardiovascular;;   CESAREAN SECTION  1992   IR ANGIO INTRA EXTRACRAN SEL COM CAROTID INNOMINATE BILAT MOD SED  05/11/2021   IR ANGIO VERTEBRAL SEL SUBCLAVIAN INNOMINATE BILAT MOD SED  05/11/2021   LOOP RECORDER INSERTION N/A 03/05/2020   Procedure: LOOP RECORDER INSERTION;  Surgeon: [name], MD;  Location: [facility] INVASIVE CV LAB;  Service: Cardiovascular;  Laterality: N/A;   TEE WITHOUT CARDIOVERSION N/A 03/05/2020   Procedure: TRANSESOPHAGEAL ECHOCARDIOGRAM (TEE);  Surgeon: [name], MD;  Location: [facility] ENDOSCOPY;  Service: Cardiovascular;  Laterality: N/A;    There were no vitals filed for this visit.   Subjective Assessment - 05/21/21 0926     Subjective Pt reports she is feeling relatively well today.    Currently in Pain? No/denies                SLP Evaluation [facility] - 05/21/21 2706       SLP Visit Information   SLP Received  On 05/21/21    Referring Provider (SLP) Areatha Keas MD    Medical Diagnosis CVA, TIA      General Information   HPI Judy Gutierrez is a 60 y.o. female with medical history significant of myalgia and myositis, other B complex deficiency, hypertension, loop recorder insertion, recently admitted for acute CVA in May with hemorrhagic conversion who presented to the emergency department at [facility] with complaints of sudden onset of dizziness while she bent over with left-sided numbness, difficulty ambulating,  and double vision.  She went immediately to the emergency department.  A code stroke was called.  CT head was negative.  The patient stated that her symptoms subsequently disappeared about 30 minutes later and she is back to her baseline now. CVAs on 01/29/20 and 02/22/21 and TIA on 05/09/21.      Balance Screen   Has the patient fallen in the past 6 months No      Prior Functional Status   Type of Home House     Lives With Spouse    Available Support Family    Vocation Works at home      Cognition   Overall Cognitive Status Within Functional Limits for tasks assessed      Auditory Comprehension   Overall Auditory Comprehension Appears within functional limits for tasks assessed  Verbal Expression   Overall Verbal Expression Impaired    Other Verbal Expression Comments Occasional anomia in onversation      Oral Motor/Sensory Function   Overall Oral Motor/Sensory Function Impaired    Lingual Coordination Reduced      Motor Speech   Overall Motor Speech Impaired    Respiration Within functional limits    Phonation Normal    Articulation Impaired    Level of Impairment Conversation    Intelligibility Intelligibility reduced    Effective Techniques Slow rate;Over-articulate      Standardized Assessments   Standardized Assessments  Other Assessment    Other Assessment BNT, WAB-B                             SLP Education - 05/21/21 1055     Education  Details Cognitive communication, ataxic dysarthria    Person(s) Educated Patient;Spouse;Other (comment)   brother   Methods Explanation;Demonstration;Handout    Comprehension Verbalized understanding;Need further instruction              SLP Short Term Goals - 05/21/21 1058       SLP SHORT TERM GOAL #1   Title Pt will recall 3 word finding strategies to use in instances of anomia independently.    Time 2    Period Weeks    Status New      SLP SHORT TERM GOAL #2   Title Pt will recall 3 strategies  for intelligibility and dysarthria independently.    Time 2    Period Weeks    Status New              SLP Long Term Goals - 05/21/21 1056       SLP LONG TERM GOAL #1   Title Pt and family will report successful use of word finding strategies at home with minimal verbal cues.    Time 4    Period Weeks    Status New      SLP LONG TERM GOAL #2   Title Pt and family will report successful use of dysarthria strategies at home with minimal verbal cues.    Time 4    Period Weeks    Status New              Plan - 05/21/21 1110     Clinical Impression Statement Pt is a 60 yo female who was seen for OP evaluation. Pt was made aware that insurance does not cover ST services; SLP reiterated and pt family were in agreement for continuing with evaluation. Pt hx of two CVAs (2021, 2022) and one TIA (2022). Pt and family reported some difficulty with speech and word finding, but had no other concerns at this time. SLP assessed pt using WAB-B and [employer] (BNT). Pt exhibited slower rate of speech with occasional word finding difficulty; however, given extra time, pt increased accuracy. Due to English being her second language, some vocabulary was difficult. SLP expressed that she was able to answer all questions in either Hindi or English. Her husband and brother were there to confirm "correct vs. incorrect" and collaborated with SLP throughout session. Pt did exhibit rare  word finding deficits during BNT in which the family confirmed that "that is a word she would know". During motor speech assessment, pt exhibited a slow rate, occasional vowel and consonant distortions, and difficulty with diadochokinesis (alternating speech movements) confirming ataxic dysarthria. SLP rec skilled speech  services to address dysarthria and anomia.    Speech Therapy Frequency 1x /week    Duration 4 weeks    Potential to Achieve Goals Good    Consulted and Agree with Plan of Care Patient;Family member/caregiver             Patient will benefit from skilled therapeutic intervention in order to improve the following deficits and impairments:   Cognitive communication deficit  Dysarthria and anarthria    Problem List Patient Active Problem List   Diagnosis Date Noted   TIA (transient ischemic attack) 05/09/2021   Chronic diastolic heart failure (grade 1 DD) 05/09/2021   Primary hypertension    Obstructive hydrocephalus (HCC)    Tachycardia    Acute cerebral infarction (HCC)    Visual disturbance as complication of stroke    Transaminitis    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 coli UTI 11/01/2013   Anemia due to blood loss, acute 10/24/2013   Pelvic fracture (HCC) 10/23/2013   Hyperglycemia 09/18/2013   Routine general medical examination at a health care facility 09/18/2013   Elevated alkaline phosphatase level 09/18/2013   Hot flashes, menopausal  04/10/2013   Pain in joint, upper arm 04/10/2013    Michelene Gardener [place] MS, [place], CBIS  05/21/2021, 11:47 AM  [facility] [address] [place], [place], [address] Phone: [phone]   Fax:  [phone]  Name: Mylah Baynes MRN: [id number] Date of Birth: 07/21/1961

## 2021-05-21 NOTE — Therapy (Signed)
[facility] [place] [facility] [address] [place], [place], [address] Phone: [phone]   Fax:  [phone]  Occupational Therapy Evaluation  Patient Details  Name: Judy Gutierrez MRN: [id number] Date of Birth: 1964/12/10 Referring Provider (OT): Claudette Laws, MD   Encounter Date: 05/21/2021   OT End of Session - 05/21/21 0851     Visit Number 1    Number of Visits 9    Date for OT Re-Evaluation 08/19/21    Authorization Type [employer] plan does not cover OT/ST)    OT Start Time 0845    OT Stop Time [phone]    OT Time Calculation (min) 37 min    Activity Tolerance Patient tolerated treatment well    Behavior During Therapy WFL for tasks assessed/performed             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)    Stroke (HCC)    Unspecified essential hypertension     Past Surgical History:  Procedure Laterality Date   BUBBLE STUDY  03/05/2020   Procedure: BUBBLE STUDY;  Surgeon: [name], MD;  Location: [facility] ENDOSCOPY;  Service: Cardiovascular;;   CESAREAN SECTION  1992   IR ANGIO INTRA EXTRACRAN SEL COM CAROTID INNOMINATE BILAT MOD SED  05/11/2021   IR ANGIO VERTEBRAL SEL SUBCLAVIAN INNOMINATE BILAT MOD SED  05/11/2021   LOOP RECORDER INSERTION N/A 03/05/2020   Procedure: LOOP RECORDER INSERTION;  Surgeon: [name], MD;  Location: [facility] INVASIVE CV LAB;  Service: Cardiovascular;  Laterality: N/A;   TEE WITHOUT CARDIOVERSION N/A 03/05/2020   Procedure: TRANSESOPHAGEAL ECHOCARDIOGRAM (TEE);  Surgeon: [name], MD;  Location: [facility] ENDOS[facility];  Service: Cardiovascular;  Laterality: N/A;    There were no vitals filed for this visit.   Subjective Assessment - 05/21/21 0849     Subjective  Pt arrives to OP OT evaluation this morning w/ primary  concerns related to decreased functional use of her RUE and limitations w/ FM skills. Pt reports she was admitted to IP rehab for almost 3 weeks and then received HH PT/OT/ST before now transitioning to OP rehab. Pt and her spouse verbalized they are aware of insurance only covering PT and would like to proceed w/ OT 1x/week.    Patient is accompanied by: Family member   Ajay (husband) and brother   Pertinent History R cerebellar CVA 02/22/21, TIA 05/09/21, and hx of cerebellar CVA 01/29/20; HTN, HLD    Patient Stated Goals Drive and return to cleaning/cooking    Currently in Pain? No/denies             [facility] OT Assessment - 05/21/21 0851       Assessment   Medical Diagnosis Cerebellar CVA    Referring Provider (OT) Claudette Laws, MD    Onset Date/Surgical Date 02/22/21   1st CVA 01/29/20   Hand Dominance Right    Next MD Visit 06/10/21    Prior Therapy Inpatient Rehab 02/26/21-03/16/21; [facility] PT/OT/ST      Precautions   Precautions None    Precaution Comments Not driving      Balance Screen   Has the patient fallen in the past 6 months No      Home  Environment   Type of Home House    Home Layout Two level  Bathroom Shower/Tub Engineer, technical salesWalk-in Shower    Home Equipment Walker - 2 wheels;Cane -quad;Shower seat;Grab bars - toilet;Grab bars - tub/shower;Hand held shower head    Lives With Family   Husband     Prior Function   Level of Independence Independent    Vocation Full time employment   Stopped working in August 2021   Leisure Gardening, Education officer, environmentalcleaning, have fun w/ friends      ADL   Eating/Feeding Modified independent   Requires extended time to cut food   Grooming Modified independent   Uses L hand sometimes; increased time   Upper Body Bathing Supervision/safety    Lower Body Bathing Supervision/safety    Upper Body Dressing Needs assist for fasteners   Buttons/zippers   Lower Body Dressing Modified independent    Toilet Transfer Supervision/safety    Toileting - Clothing  Manipulation Increased time    Toileting -  Hygiene Increase time    Tub/Shower Transfer Supervision/safety      IADL   Prior Level of Function Light Housekeeping Independent    Light Housekeeping Performs light daily tasks such as dishwashing, bed making    Prior Level of Function Meal Prep Independent    Meal Prep Able to complete simple cold meal and snack prep;Able to complete simple warm meal prep   SPV for safety   Prior Level of Function Best boyCommunity Mobility Independent    Community Mobility Relies on family or friends for transportation    Prior Level of Function Medication Managment Independent    Medication Management Is responsible for taking medication in correct dosages at correct time      Written Expression   Dominant Hand Right    Handwriting Increased time   Reports practicing a lot at home     Vision - History   Additional Comments Pt reports some dizziness when looking down      Vision Assessment   Ocular Range of Motion Within Functional Limits    Tracking/Visual Pursuits Other (comment)   To be further tested in functional context; decreased smoothness of tracking and occasional head turns when tracking horizontally   Saccades Decreased speed of saccadic movement    Convergence Within functional limits      Cognition   Overall Cognitive Status Within Functional Limits for tasks assessed    Cognition Comments Occasional word retrieval difficulty      Sensation   Light Touch Appears Intact    Hot/Cold Appears Intact   per pt report     Coordination   Gross Motor Movements are Fluid and Coordinated Yes    Fine Motor Movements are Fluid and Coordinated Yes    Coordination and Movement Description Decreased speed of movement; mild dysdiadochokinesia    Finger Nose Finger Test Mild dysmetria    9 Hole Peg Test Right;Left    Right 9 Hole Peg Test 1 min, 6 sec    Left 9 Hole Peg Test 28 sec      ROM / Strength   AROM / PROM / Strength AROM;Strength      AROM    Overall AROM Comments Bilateral shoulders/elbow WFL   RUE forearm supination 61* (LUE 71*)     Strength   Overall Strength Comments BUE strength grossly 4-/5      Hand Function   Right Hand Gross Grasp Functional    Right Hand Grip (lbs) 28    Right Hand Lateral Pinch 12 lbs    Left Hand Gross Grasp Functional    Left  Hand Grip (lbs) 32    Left Hand Lateral Pinch 14 lbs             OT Education - 05/21/21 0850     Education Details Education provided on role and purpose of OP OT, as well as potential interventions/goals for therapy and results of evaluation.    Person(s) Educated Patient;Spouse;Other (comment)   husband and brother   Methods Explanation    Comprehension Verbalized understanding             OT Short Term Goals - 05/21/21 0917       OT SHORT TERM GOAL #1   Title Pt will demonstrate independence w/ HEP designed for increased BUE strength and ROM    Time 4    Period Weeks    Status New    Target Date 06/18/21      OT SHORT TERM GOAL #2   Title Pt will improve R forearm supination by at least 4 degrees to improve participation in cooking tasks    Baseline R forearm supination 61* (LUE 71*)    Time 4    Period Weeks    Status New      OT SHORT TERM GOAL #3   Title Pt will complete 9-HPT w/ R hand in less than 1 minute to demonstrate improvement in functional FM skills w/ R, dominant UE    Baseline R hand 1 min, 6 sec; L hand 28 sec    Time 4    Period Weeks    Status New      OT SHORT TERM GOAL #4   Title Pt will improve functional use of RUE as evidenced by completing bilateral integration task (e.g., folding clothes, cutting food) within pt-determined acceptable level of time    Time 4    Period Weeks    Status New             OT Long Term Goals - 05/21/21 3888       OT LONG TERM GOAL #1   Title Pt will be independent w/ full HEP designed for increased coordination and functional use of RUE    Time 8    Period Weeks    Status New     Target Date 07/16/21      OT LONG TERM GOAL #2   Title Pt will complete gardening task at Mod I, using AE/compensatory strategies for safety prn    Time 8    Period Weeks    Status New      OT LONG TERM GOAL #3   Title Pt will report completing at least 75% of desired household cleaning activities w/ SPV prn for safety    Time 8    Period Weeks    Status New             Plan - 05/21/21 0903     Clinical Impression Statement Pt is a 60 y/o female who presents to OP OT due to residual deficits s/p R cerebellar CVA on 02/22/21. Pt currently lives with her husband in a 2-story home and her PMHx includes hx of cerebellar CVA on 01/28/21, TIA on 05/09/21, HTN, and HLD. Pt demonstrates difficulty w/ FM skills, mild dysmetria, decreased ROM and strength of R side, and decreased coordination. Pt will benefit from skilled occupational therapy services to address these deficits, as well as balance, safety awareness, introduction of compensatory strategies/AE prn, visual-perception, and implementation of an HEP to improve participation and safety during ADLs  and IADLs.    OT Occupational Profile and History Detailed Assessment- Review of Records and additional review of physical, cognitive, psychosocial history related to current functional performance    Occupational performance deficits (Please refer to evaluation for details): ADL's;IADL's;Work;Leisure    Body Structure / Function / Physical Skills ADL;ROM;UE functional use;Balance;Decreased knowledge of use of DME;FMC;Vision;Dexterity;Body mechanics;GMC;Strength;Endurance;Coordination;IADL    Rehab Potential Good    Clinical Decision Making Several treatment options, min-mod task modification necessary    Comorbidities Affecting Occupational Performance: May have comorbidities impacting occupational performance    Modification or Assistance to Complete Evaluation  Min-Moderate modification of tasks or assist with assess necessary to complete eval     OT Frequency 1x / week   Decreased frequency due to insurance coverage   OT Duration 8 weeks    OT Treatment/Interventions Self-care/ADL training;Electrical Stimulation;Therapeutic exercise;Visual/perceptual remediation/compensation;Patient/family education;Splinting;Neuromuscular education;Moist Heat;Energy conservation;Building services engineer;Therapeutic activities;Balance training;Passive range of motion;Manual Therapy;DME and/or AE instruction;Ultrasound;Cryotherapy    Plan Review goals w/ pt; initiate HEP for AROM/coordination    Consulted and Agree with Plan of Care Patient;Family member/caregiver    Family Member Consulted Husband (Ajay)             Patient will benefit from skilled therapeutic intervention in order to improve the following deficits and impairments:   Body Structure / Function / Physical Skills: ADL, ROM, UE functional use, Balance, Decreased knowledge of use of DME, FMC, Vision, Dexterity, Body mechanics, GMC, Strength, Endurance, Coordination, IADL   Visit Diagnosis: Other lack of coordination  Muscle weakness (generalized)  Unsteadiness on feet  Other symptoms and signs involving the nervous system    Problem List Patient Active Problem List   Diagnosis Date Noted   TIA (transient ischemic attack) 05/09/2021   Chronic diastolic heart failure (grade 1 DD) 05/09/2021   Primary hypertension    Obstructive hydrocephalus (HCC)    Tachycardia    Acute cerebral infarction (HCC)    Visual disturbance as complication of stroke    Transaminitis    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 coli UTI 11/01/2013   Anemia due to blood loss, acute 10/24/2013   Pelvic fracture (HCC) 10/23/2013   Hyperglycemia 09/18/2013   Routine general medical examination at a health care facility 09/18/2013   Elevated alkaline phosphatase level 09/18/2013   Hot flashes, menopausal 04/10/2013   Pain in joint, upper arm 04/10/2013     Rosie Fate, OTR/L, MSOT  05/21/2021, 9:30 AM  [facility] [place] [facility] [address] [place], [place], [address] Phone: [phone]   Fax:  [phone]  Name: Rheanne Cortopassi MRN: [id number] Date of Birth: 1961/01/15

## 2021-05-21 NOTE — Therapy (Signed)
[facility] [address] [place], [place], [address] Phone: [phone]   Fax:  [phone]  Physical Therapy Treatment  Patient Details  Name: Judy Gutierrez MRN: [id number] Date of Birth: 08-30-1961 Referring Provider (PT): Kirsteins   Encounter Date: 05/21/2021   PT End of Session - 05/21/21 0803     Visit Number 3    Date for PT Re-Evaluation 08/05/21    PT Start Time 0800    PT Stop Time 0843    PT Time Calculation (min) 43 min    Equipment Utilized During Treatment Gait belt    Activity Tolerance Patient tolerated treatment well    Behavior During Therapy WFL for tasks assessed/performed             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)    Stroke (HCC)    Unspecified essential hypertension     Past Surgical History:  Procedure Laterality Date   BUBBLE STUDY  03/05/2020   Procedure: BUBBLE STUDY;  Surgeon: [name], MD;  Location: [facility] ENDOSCOPY;  Service: Cardiovascular;;   CESAREAN SECTION  1992   IR ANGIO INTRA EXTRACRAN SEL COM CAROTID INNOMINATE BILAT MOD SED  05/11/2021   IR ANGIO VERTEBRAL SEL SUBCLAVIAN INNOMINATE BILAT MOD SED  05/11/2021   LOOP RECORDER INSERTION N/A 03/05/2020   Procedure: LOOP RECORDER INSERTION;  Surgeon: [name], MD;  Location: [facility] INVASIVE CV LAB;  Service: Cardiovascular;  Laterality: N/A;   TEE WITHOUT CARDIOVERSION N/A 03/05/2020   Procedure: TRANSESOPHAGEAL ECHOCARDIOGRAM (TEE);  Surgeon: [name], MD;  Location: [facility] ENDOSCOPY;  Service: Cardiovascular;  Laterality: N/A;    There were no vitals filed for this visit.   Subjective Assessment - 05/21/21 0802     Subjective No pain and denies any falls since previous session. doing exercises, going well.    Patient is accompained by: Family member    husband   Patient Stated Goals improve balance, strength.    Currently in Pain? No/denies                               [facility] Adult PT Treatment/Exercise - 05/21/21 0001       Neuro Re-ed    Neuro Re-ed Details  R wt shift + L step on 4" 10x, x 10 while holding small red med ball as well      Knee/Hip Exercises: Aerobic   Nustep L6 x 6 min (UEs/LEs)      Knee/Hip Exercises: Standing   Other Standing Knee Exercises alt step taps at 6" stair without UE support, CGA/CS at pelvis 2 x 10      Knee/Hip Exercises: Seated   Long Arc Quad Strengthening;Both;2 sets;10 reps    Long Arc Quad Weight 2 lbs.    Hamstring Curl Strengthening;Both;2 sets;10 reps   2#   Sit to Sand 1 set;10 reps;without UE support                 Balance Exercises - 05/21/21 0001       Balance Exercises: Standing   Standing Eyes Opened Narrow base of support (BOS);Foam/compliant surface   10 reps head turns L/R   Tandem Stance 2 reps;30 secs   1/4 tandem. also with ball toss x 10  each side   Other Standing Exercises Comments forward, sidestepping and bckwards walking 4x length of parallel bars. sidestepping on airex x 4 back and forth, first 2 laps with 1UE only.  last 2 laps without UE support but CGA.   no UE support, CS to CGA for LOB                PT Short Term Goals - 05/17/21 1751       PT SHORT TERM GOAL #1   Title Independent with initial HEP with family SBA for safety    Time 2    Period Weeks    Status Achieved    Target Date 05/27/21               PT Long Term Goals - 05/17/21 1751       PT LONG TERM GOAL #1   Title Independent with advanced HEP    Time 12    Period Weeks    Status On-going      PT LONG TERM GOAL #2   Title Berg improved to at least 48/56 to demonstrate decreased falls risk    Time 12    Period Weeks    Status On-going      PT LONG TERM GOAL #3   Title TUG improved to </= 10 seconds with SPC or independent, no LOB to  demonstrate improved functional gait speed and decreased falls risk    Time 12    Period Weeks    Status On-going      PT LONG TERM GOAL #4   Title Improved BUE and BLE strength to at least 4+/5    Time 12    Period Weeks    Status On-going                   Plan - 05/21/21 0803     Clinical Impression Statement Pt arrived to session with husband and brother today who wanted to see how PT was going. No falls since previous. Continued previous sessions exercise in addition to some seated strength with emphasis on controlled motion, able to control knee flex/ext with shakiness with frequent cues. Also progressed standing balance with sidestepping on airex in  bars with CGA at pelvis / no UE and this was challenging and effortful but able to complete successfully, seated rest provided after d/t fatigue.    PT Treatment/Interventions ADLs/Self Care Home Management;Cryotherapy;Electrical Stimulation;Iontophoresis 4mg /ml Dexamethasone;Moist Heat;Functional mobility training;Therapeutic activities;Therapeutic exercise;Balance training;Neuromuscular re-education;Patient/family education;Manual techniques;Energy conservation;Vestibular;Canalith Repostioning;Gait training;Stair training    PT Next Visit Plan BUE/LE and core strengthening, gait/balance stability. Manual/modalities as needed to facilitate progress.    Consulted and Agree with Plan of Care Patient;Family member/caregiver    Family Member Consulted husband             Patient will benefit from skilled therapeutic intervention in order to improve the following deficits and impairments:  Abnormal gait, Decreased range of motion, Difficulty walking, Impaired UE functional use, Increased muscle spasms, Decreased endurance, Pain, Improper body mechanics, Decreased balance, Decreased strength, Decreased mobility  Visit Diagnosis: History of CVA (cerebrovascular accident)  Muscle weakness (generalized)  Ataxic  gait  Unsteadiness on feet     Problem List Patient Active Problem List   Diagnosis Date Noted   TIA (transient ischemic attack) 05/09/2021   Chronic diastolic heart failure (grade 1 DD) 05/09/2021   Primary hypertension    Obstructive hydrocephalus (HCC)    Tachycardia    Acute cerebral infarction (HCC)  Visual disturbance as complication of stroke    Transaminitis    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 coli UTI 11/01/2013   Anemia due to blood loss, acute 10/24/2013   Pelvic fracture (HCC) 10/23/2013   Hyperglycemia 09/18/2013   Routine general medical examination at a health care facility 09/18/2013   Elevated alkaline phosphatase level 09/18/2013   Hot flashes, menopausal 04/10/2013   Pain in joint, upper arm 04/10/2013    Anson Crofts, PT, DPT 05/21/2021, 9:00 AM  [facility] [address] [place], [place], [address] Phone: [phone]   Fax:  [phone]  Name: Judy Gutierrez MRN: [id number] Date of Birth: 1961/05/03

## 2021-05-24 ENCOUNTER — Ambulatory Visit: Payer: No Typology Code available for payment source | Admitting: Speech Pathology

## 2021-05-24 ENCOUNTER — Ambulatory Visit: Payer: No Typology Code available for payment source | Admitting: Occupational Therapy

## 2021-05-24 ENCOUNTER — Other Ambulatory Visit: Payer: Self-pay

## 2021-05-24 ENCOUNTER — Encounter: Payer: Self-pay | Admitting: Speech Pathology

## 2021-05-24 ENCOUNTER — Encounter: Payer: Self-pay | Admitting: Occupational Therapy

## 2021-05-24 DIAGNOSIS — R29818 Other symptoms and signs involving the nervous system: Secondary | ICD-10-CM

## 2021-05-24 DIAGNOSIS — R41841 Cognitive communication deficit: Secondary | ICD-10-CM

## 2021-05-24 DIAGNOSIS — M6281 Muscle weakness (generalized): Secondary | ICD-10-CM

## 2021-05-24 DIAGNOSIS — R278 Other lack of coordination: Secondary | ICD-10-CM

## 2021-05-24 DIAGNOSIS — Z8673 Personal history of transient ischemic attack (TIA), and cerebral infarction without residual deficits: Secondary | ICD-10-CM | POA: Diagnosis not present

## 2021-05-24 NOTE — Therapy (Signed)
[facility] [address] [place], [place], [address] Phone: [phone]   Fax:  [phone]  Speech Language Pathology Treatment  Patient Details  Name: Judy Gutierrez MRN: [id number] Date of Birth: 1961/08/03 Referring Provider (SLP): Areatha Keas MD   Encounter Date: 05/24/2021   End of Session - 05/24/21 0836     Visit Number 2    Number of Visits 3    Date for SLP Re-Evaluation 06/21/21    SLP Start Time 0800    SLP Stop Time  0840    SLP Time Calculation (min) 40 min    Activity Tolerance Patient tolerated treatment well             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)    Stroke [facility])    Unspecified essential hypertension     Past Surgical History:  Procedure Laterality Date   BUBBLE STUDY  03/05/2020   Procedure: BUBBLE STUDY;  Surgeon: [name], MD;  Location: [facility] ENDOSCOPY;  Service: Cardiovascular;;   CESAREAN SECTION  1992   IR ANGIO INTRA EXTRACRAN SEL COM CAROTID INNOMINATE BILAT MOD SED  05/11/2021   IR ANGIO VERTEBRAL SEL SUBCLAVIAN INNOMINATE BILAT MOD SED  05/11/2021   LOOP RECORDER INSERTION N/A 03/05/2020   Procedure: LOOP RECORDER INSERTION;  Surgeon: [name], MD;  Location: [facility] INVASIVE CV LAB;  Service: Cardiovascular;  Laterality: N/A;   TEE WITHOUT CARDIOVERSION N/A 03/05/2020   Procedure: TRANSESOPHAGEAL ECHOCARDIOGRAM (TEE);  Surgeon: [name], MD;  Location: [facility] ENDOSCOPY;  Service: Cardiovascular;  Laterality: N/A;    There were no vitals filed for this visit.   Subjective Assessment - 05/24/21 0802     Subjective Pt reports she had a relaxing weekend.    Patient is accompained by: Family member    Currently in Pain? No/denies                   ADULT SLP TREATMENT - 05/24/21 0847        General Information   Behavior/Cognition Alert;Cooperative      Treatment Provided   Treatment provided Cognitive-Linquistic      Cognitive-Linquistic Treatment   Treatment focused on Cognition    Skilled Treatment Educated pt and family on word finding strategies and the importance of providing extra time for processing. Pt did not exhibit any word finding difficulty in today's session. SLP provided pt with dysarthria strategies (although, SLP did not note any today in conversation. Pt brother reported her speaking more slowly in her first language, but no difficulty with understanding her). Pt demonstrated understanding. SLP to see x1 before d/c.      Assessment / Recommendations / Plan   Plan Continue with current plan of care      Progression Toward Goals   Progression toward goals Progressing toward goals              SLP Education - 05/24/21 0855     Education Details word finding strategies, dysarthria strategies    Person(s) Educated Patient;Spouse    Methods Explanation;Demonstration;Handout    Comprehension Verbalized understanding;Returned demonstration              SLP Short Term Goals - 05/24/21 0840       SLP SHORT TERM GOAL #1   Title Pt will  recall 3 word finding strategies to use in instances of anomia independently.    Time 2    Period Weeks    Status On-going      SLP SHORT TERM GOAL #2   Title Pt will recall 3 strategies  for intelligibility and dysarthria independently.    Time 2    Period Weeks    Status On-going              SLP Long Term Goals - 05/24/21 0846       SLP LONG TERM GOAL #1   Title Pt and family will report successful use of word finding strategies at home with minimal verbal cues.    Time 4    Period Weeks    Status On-going      SLP LONG TERM GOAL #2   Title Pt and family will report successful use of dysarthria strategies at home with minimal verbal cues.    Time 4    Period Weeks    Status On-going               Plan - 05/24/21 6269     Clinical Impression Statement SLP screened cognition using SLUMS. Pt scored a 25/30 with most difficulty in areas that required language. Family and patient do not feel they have any concerns and cognition will be addressed as related to language. Pt was able to demonstrate examples of word finding strategies. SLP rec skilled speech services to address dysarthria and anomia.    Speech Therapy Frequency 1x /week    Duration 4 weeks    Potential to Achieve Goals Good    Consulted and Agree with Plan of Care Patient;Family member/caregiver             Patient will benefit from skilled therapeutic intervention in order to improve the following deficits and impairments:   Cognitive communication deficit    Problem List Patient Active Problem List   Diagnosis Date Noted   TIA (transient ischemic attack) 05/09/2021   Chronic diastolic heart failure (grade 1 DD) 05/09/2021   Primary hypertension    Obstructive hydrocephalus (HCC)    Tachycardia    Acute cerebral infarction (HCC)    Visual disturbance as complication of stroke    Transaminitis    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 coli UTI 11/01/2013   Anemia due to blood loss, acute 10/24/2013   Pelvic fracture (HCC) 10/23/2013   Hyperglycemia 09/18/2013   Routine general medical examination at a health care facility 09/18/2013   Elevated alkaline phosphatase level 09/18/2013   Hot  flashes, menopausal 04/10/2013   Pain in joint, upper arm 04/10/2013    Michelene Gardener [place] MS, [place], CBIS  05/24/2021, 8:56 AM  [facility] [address] [place], [place], [address] Phone: [phone]   Fax:  [phone]   Name: Judy Gutierrez MRN: [id number] Date of Birth: Mar 23, 1961

## 2021-05-24 NOTE — Patient Instructions (Addendum)
Pronation / Supination (Resistive)    Hold hammer weighing 2# or less and rotate palm up and down. Keep elbow flexed at side and wrist straight. Repeat 10 times slowly each set.  Copyright  VHI. All rights reserved.    Coordination Exercises  Perform the following exercises for 5-10 minutes 2 times per day. Perform with right/both hand(s).  Pick up 5-10 coins one at a time and hold in palm. Then, move coins from palm to fingertips one at a time and place in a stack (5-10 each stack). Do not drag coin to the edge when picking it up. Stretch hand/fingers open after each set. Pick up coins and place in coin bank or container. Do not drag coin to the edge to pick up.

## 2021-05-24 NOTE — Therapy (Signed)
[facility] [address] [place], [place], [address] Phone: [phone]   Fax:  [phone]  Occupational Therapy Treatment  Patient Details  Name: Judy Gutierrez MRN: [id number] Date of Birth: 1961/09/24 Referring Provider (OT): Claudette Laws, MD   Encounter Date: 05/24/2021   OT End of Session - 05/24/21 0850     Visit Number 2    Number of Visits 9    Date for OT Re-Evaluation 08/19/21    Authorization Type [employer] plan does not cover OT/ST)    OT Start Time 0848    OT Stop Time 0931    OT Time Calculation (min) 43 min    Activity Tolerance Patient tolerated treatment well    Behavior During Therapy WFL for tasks assessed/performed             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)    Stroke (HCC)    Unspecified essential hypertension     Past Surgical History:  Procedure Laterality Date   BUBBLE STUDY  03/05/2020   Procedure: BUBBLE STUDY;  Surgeon: [name], MD;  Location: [facility] ENDOSCOPY;  Service: Cardiovascular;;   CESAREAN SECTION  1992   IR ANGIO INTRA EXTRACRAN SEL COM CAROTID INNOMINATE BILAT MOD SED  05/11/2021   IR ANGIO VERTEBRAL SEL SUBCLAVIAN INNOMINATE BILAT MOD SED  05/11/2021   LOOP RECORDER INSERTION N/A 03/05/2020   Procedure: LOOP RECORDER INSERTION;  Surgeon: [name], MD;  Location: [facility] INVASIVE CV LAB;  Service: Cardiovascular;  Laterality: N/A;   TEE WITHOUT CARDIOVERSION N/A 03/05/2020   Procedure: TRANSESOPHAGEAL ECHOCARDIOGRAM (TEE);  Surgeon: [name], MD;  Location: [facility] ENDOSCOPY;  Service: Cardiovascular;  Laterality: N/A;    There were no vitals filed for this visit.   Subjective Assessment - 05/24/21 0849     Subjective  Pt reports she has been practicing a lot w/ power web hand  exerciser, theraband, grip exerciser, and pennies at home    Patient is accompanied by: Family member   Ajay (husband) and brother   Pertinent History R cerebellar CVA 02/22/21, TIA 05/09/21, and hx of cerebellar CVA 01/29/20; HTN, HLD    Patient Stated Goals Drive and return to cleaning/cooking    Currently in Pain? No/denies             Treatment/Exercises - 05/24/21    Closed-Chain Forearm AROM AROM of R forearm supination; completed in sitting w/ forearm on tabletop and elbow flexed at 90. Pt required verbal cues to decrease compensatory pattern at shoulder  Closed-chain AAROM of R forearm supination/pronation w/ mallet and forearm positioned on arm elevation wedge, holding stretch at end range for 2-3 sec w/ no add'l pain    Pegboard Activity Copying pattern onto pegboard w/ easy-grip pegs to facilitate FMC, alternating attention, hand strengthening, and visual perception; pt able to complete activity w/ Mod I, requiring extended time due to decreased coordination    Coordination Activities Picking up marbles 3 at a time, translating each to fingertips, and placing on easy-grip pegs to facilitate in-hand manipulation, precision grasp/release, and intrinsic hand strengthening. Completed w/ multiple drops; increased success with decreased speed. To return marbles to container, pt picked up off of pegs individually using 2-point pinch; attempted to use resistive clothespin (1#) to retrieve marbles from pegs w/out success  Retrieving coins 5 at a time, translating palm to fingertips and placing in corresponding stack; completed w/ min difficulty/drops            OT Education - 05/24/21 0853     Education Details Initated HEP for forearm supination and coordination (see pt instructions); provided condition-specific education    Person(s) Educated Patient;Other (comment)   brother   Methods Explanation;Demonstration;Handout    Comprehension Verbalized understanding;Returned  demonstration;Verbal cues required;Tactile cues required;Need further instruction             OT Short Term Goals - 05/24/21 0850       OT SHORT TERM GOAL #1   Title Pt will demonstrate independence w/ HEP designed for increased BUE strength and ROM    Time 4    Period Weeks    Status On-going    Target Date 06/18/21      OT SHORT TERM GOAL #2   Title Pt will improve R forearm supination by at least 4 degrees to improve participation in cooking tasks    Baseline R forearm supination 61* (LUE 71*)    Time 4    Period Weeks    Status On-going      OT SHORT TERM GOAL #3   Title Pt will complete 9-HPT w/ R hand in less than 1 minute to demonstrate improvement in functional FM skills w/ R, dominant UE    Baseline R hand 1 min, 6 sec; L hand 28 sec    Time 4    Period Weeks    Status On-going      OT SHORT TERM GOAL #4   Title Pt will improve functional use of RUE as evidenced by completing bilateral integration task (e.g., folding clothes, cutting food) within pt-determined acceptable level of time    Time 4    Period Weeks    Status On-going             OT Long Term Goals - 05/24/21 0851       OT LONG TERM GOAL #1   Title Pt will be independent w/ full HEP designed for increased coordination and functional use of RUE    Time 8    Period Weeks    Status On-going      OT LONG TERM GOAL #2   Title Pt will complete gardening task at Mod I, using AE/compensatory strategies for safety prn    Time 8    Period Weeks    Status On-going      OT LONG TERM GOAL #3   Title Pt will report completing at least 75% of desired household cleaning activities w/ SPV prn for safety    Time 8    Period Weeks    Status On-going             Plan - 05/24/21 5035     Clinical Impression Statement Reviewed all goals w/ pt who was agreeable to POC at this time. Pt did experience add'l discomfort during OT-facilitated PROM, so intervention was d/c and AROM and closed-chain  supination/pronation attempted instead w/ increased success. Mod verbal cues for force gradation required during closed-chain exercises due to overstraining of grasp leading to add'l pain in RUE. Pt also occasionally benefited from modeling and verbal cues during coordination activities to decrease speed and focus on accuracy.    OT Occupational Profile and History Detailed Assessment- Review of Records and additional review of physical, cognitive, psychosocial history related to current functional performance  Occupational performance deficits (Please refer to evaluation for details): ADL's;IADL's;Work;Leisure    Body Structure / Function / Physical Skills ADL;ROM;UE functional use;Balance;Decreased knowledge of use of DME;FMC;Vision;Dexterity;Body mechanics;GMC;Strength;Endurance;Coordination;IADL    Rehab Potential Good    Clinical Decision Making Several treatment options, min-mod task modification necessary    Comorbidities Affecting Occupational Performance: May have comorbidities impacting occupational performance    Modification or Assistance to Complete Evaluation  Min-Moderate modification of tasks or assist with assess necessary to complete eval    OT Frequency 1x / week   Decreased frequency due to insurance coverage   OT Duration 8 weeks    OT Treatment/Interventions Self-care/ADL training;Electrical Stimulation;Therapeutic exercise;Visual/perceptual remediation/compensation;Patient/family education;Splinting;Neuromuscular education;Moist Heat;Energy conservation;Building services engineer;Therapeutic activities;Balance training;Passive range of motion;Manual Therapy;DME and/or AE instruction;Ultrasound;Cryotherapy    Plan Continue w/ coordination activities/HEP    Consulted and Agree with Plan of Care Patient;Family member/caregiver    Family Member Consulted Husband (Ajay)             Patient will benefit from skilled therapeutic intervention in order to improve the following  deficits and impairments:   Body Structure / Function / Physical Skills: ADL, ROM, UE functional use, Balance, Decreased knowledge of use of DME, FMC, Vision, Dexterity, Body mechanics, GMC, Strength, Endurance, Coordination, IADL   Visit Diagnosis: Other lack of coordination  Muscle weakness (generalized)  Other symptoms and signs involving the nervous system    Problem List Patient Active Problem List   Diagnosis Date Noted   TIA (transient ischemic attack) 05/09/2021   Chronic diastolic heart failure (grade 1 DD) 05/09/2021   Primary hypertension    Obstructive hydrocephalus (HCC)    Tachycardia    Acute cerebral infarction (HCC)    Visual disturbance as complication of stroke    Transaminitis    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 coli UTI 11/01/2013   Anemia due to blood loss, acute 10/24/2013   Pelvic fracture (HCC) 10/23/2013   Hyperglycemia 09/18/2013   Routine general medical examination at a health care facility 09/18/2013   Elevated alkaline phosphatase level 09/18/2013   Hot flashes, menopausal 04/10/2013   Pain in joint, upper arm 04/10/2013     Rosie Fate, OTR/L, MSOT  05/24/2021, 2:43 PM  [facility] [address] [place], [place], [address] Phone: [phone]   Fax:  [phone]  Name: Judy Gutierrez MRN: [id number] Date of Birth: 04/19/1961

## 2021-05-25 ENCOUNTER — Encounter: Payer: Self-pay | Admitting: Physical Therapy

## 2021-05-25 ENCOUNTER — Ambulatory Visit: Payer: No Typology Code available for payment source | Admitting: Physical Therapy

## 2021-05-25 DIAGNOSIS — R260 Ataxic gait: Secondary | ICD-10-CM

## 2021-05-25 DIAGNOSIS — Z8673 Personal history of transient ischemic attack (TIA), and cerebral infarction without residual deficits: Secondary | ICD-10-CM

## 2021-05-25 DIAGNOSIS — R2681 Unsteadiness on feet: Secondary | ICD-10-CM

## 2021-05-25 DIAGNOSIS — M6281 Muscle weakness (generalized): Secondary | ICD-10-CM

## 2021-05-25 NOTE — Therapy (Signed)
[facility] [address] [place], [place], [address] Phone: [phone]   Fax:  [phone]  Physical Therapy Treatment  Patient Details  Name: Judy Gutierrez MRN: [id number] Date of Birth: December 29, 1960 Referring Provider (PT): Kirsteins   Encounter Date: 05/25/2021   PT End of Session - 05/25/21 0843     Visit Number 4    Date for PT Re-Evaluation 08/05/21    PT Start Time 0801    PT Stop Time 0842    PT Time Calculation (min) 41 min    Equipment Utilized During Treatment Gait belt    Activity Tolerance Patient tolerated treatment well    Behavior During Therapy WFL for tasks assessed/performed             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)    Stroke (HCC)    Unspecified essential hypertension     Past Surgical History:  Procedure Laterality Date   BUBBLE STUDY  03/05/2020   Procedure: BUBBLE STUDY;  Surgeon: [name], MD;  Location: [facility] ENDOSCOPY;  Service: Cardiovascular;;   CESAREAN SECTION  1992   IR ANGIO INTRA EXTRACRAN SEL COM CAROTID INNOMINATE BILAT MOD SED  05/11/2021   IR ANGIO VERTEBRAL SEL SUBCLAVIAN INNOMINATE BILAT MOD SED  05/11/2021   LOOP RECORDER INSERTION N/A 03/05/2020   Procedure: LOOP RECORDER INSERTION;  Surgeon: [name], MD;  Location: [facility] INVASIVE CV LAB;  Service: Cardiovascular;  Laterality: N/A;   TEE WITHOUT CARDIOVERSION N/A 03/05/2020   Procedure: TRANSESOPHAGEAL ECHOCARDIOGRAM (TEE);  Surgeon: [name], MD;  Location: [facility] ENDOSCOPY;  Service: Cardiovascular;  Laterality: N/A;    There were no vitals filed for this visit.   Subjective Assessment - 05/25/21 0803     Subjective "Everything is fine." Doing all of her exercises.    Patient is accompained by: Family member   husband   Patient Stated  Goals improve balance, strength.    Currently in Pain? No/denies                               [facility] Adult PT Treatment/Exercise - 05/25/21 0001       Neuro Re-ed    Neuro Re-ed Details  R/L resisted trunk rotation with wide/narrow BOS 10x each red/green TB with CGA      Knee/Hip Exercises: Stretches   Gastroc Stretch Both;1 rep;30 seconds    Gastroc Stretch Limitations B UE support      Knee/Hip Exercises: Aerobic   Recumbent Bike L2.0 x 6 min      Knee/Hip Exercises: Standing   Heel Raises Both;Right;Left    Heel Raises Limitations B 15x, L/R 10x      Knee/Hip Exercises: Seated   Sit to Sand 10 reps;without UE support;2 sets   10x with EC, 10x standing on airex                Balance Exercises - 05/25/21 0001       Balance Exercises: Standing   Other Standing Exercises heel/toe walking in II bars x5 min   CGA and varied UE support required   Other Standing Exercises Comments R/L toe tap on 3 multicolored cones with x15   most instability on R LE  PT Education - 05/25/21 0843     Education Details update to HEP- to be performed with husband supervision for safety    Person(s) Educated Patient;Spouse    Methods Explanation;Demonstration;Tactile cues;Verbal cues;Handout    Comprehension Verbalized understanding;Returned demonstration              PT Short Term Goals - 05/17/21 1751       PT SHORT TERM GOAL #1   Title Independent with initial HEP with family SBA for safety    Time 2    Period Weeks    Status Achieved    Target Date 05/27/21               PT Long Term Goals - 05/17/21 1751       PT LONG TERM GOAL #1   Title Independent with advanced HEP    Time 12    Period Weeks    Status On-going      PT LONG TERM GOAL #2   Title Berg improved to at least 48/56 to demonstrate decreased falls risk    Time 12    Period Weeks    Status On-going      PT LONG TERM GOAL #3   Title TUG improved to  </= 10 seconds with SPC or independent, no LOB to demonstrate improved functional gait speed and decreased falls risk    Time 12    Period Weeks    Status On-going      PT LONG TERM GOAL #4   Title Improved BUE and BLE strength to at least 4+/5    Time 12    Period Weeks    Status On-going                   Plan - 05/25/21 0843     Clinical Impression Statement Patient arrived to session with husband without complaints. Worked on STS transfers with additional balance challenges. Patient intermittently required cueing to shift weird anteriorly to avoid LOB. Patient demonstrated most challenge/imbalance with standing balance work today. Most instability occurred on the R LE, particularly when attempting to cross midline with the L LE. Increased challenge with calf strengthening today, which patient demonstrated good tolerance of. Updated HEP with exercise that was well-tolerated today. Patient reported understanding and without complaints at end of session.    PT Treatment/Interventions ADLs/Self Care Home Management;Cryotherapy;Electrical Stimulation;Iontophoresis 4mg /ml Dexamethasone;Moist Heat;Functional mobility training;Therapeutic activities;Therapeutic exercise;Balance training;Neuromuscular re-education;Patient/family education;Manual techniques;Energy conservation;Vestibular;Canalith Repostioning;Gait training;Stair training    PT Next Visit Plan BUE/LE and core strengthening, gait/balance stability. Manual/modalities as needed to facilitate progress.    Consulted and Agree with Plan of Care Patient;Family member/caregiver    Family Member Consulted husband             Patient will benefit from skilled therapeutic intervention in order to improve the following deficits and impairments:  Abnormal gait, Decreased range of motion, Difficulty walking, Impaired UE functional use, Increased muscle spasms, Decreased endurance, Pain, Improper body mechanics, Decreased balance,  Decreased strength, Decreased mobility  Visit Diagnosis: History of CVA (cerebrovascular accident)  Muscle weakness (generalized)  Ataxic gait  Unsteadiness on feet     Problem List Patient Active Problem List   Diagnosis Date Noted   TIA (transient ischemic attack) 05/09/2021   Chronic diastolic heart failure (grade 1 DD) 05/09/2021   Primary hypertension    Obstructive hydrocephalus (HCC)    Tachycardia    Acute cerebral infarction (HCC)    Visual disturbance as complication of stroke  Transaminitis    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 coli UTI 11/01/2013   Anemia due to blood loss, acute 10/24/2013   Pelvic fracture (HCC) 10/23/2013   Hyperglycemia 09/18/2013   Routine general medical examination at a health care facility 09/18/2013   Elevated alkaline phosphatase level 09/18/2013   Hot flashes, menopausal 04/10/2013   Pain in joint, upper arm 04/10/2013     Anette Guarneri, PT, DPT 05/25/21 8:47 AM    [facility] [address] [place], [place], [address] Phone: [phone]   Fax:  [phone]  Name: Judy Gutierrez MRN: [id number] Date of Birth: 21-Dec-1960

## 2021-05-28 ENCOUNTER — Other Ambulatory Visit: Payer: Self-pay

## 2021-05-28 ENCOUNTER — Ambulatory Visit: Payer: No Typology Code available for payment source

## 2021-05-28 DIAGNOSIS — R2681 Unsteadiness on feet: Secondary | ICD-10-CM

## 2021-05-28 DIAGNOSIS — M6281 Muscle weakness (generalized): Secondary | ICD-10-CM

## 2021-05-28 DIAGNOSIS — Z8673 Personal history of transient ischemic attack (TIA), and cerebral infarction without residual deficits: Secondary | ICD-10-CM | POA: Diagnosis not present

## 2021-05-28 DIAGNOSIS — R260 Ataxic gait: Secondary | ICD-10-CM

## 2021-05-28 DIAGNOSIS — R278 Other lack of coordination: Secondary | ICD-10-CM

## 2021-05-28 NOTE — Therapy (Signed)
[facility] [address] [place], [place], [address] Phone: [phone]   Fax:  [phone]  Physical Therapy Treatment  Patient Details  Name: Judy Gutierrez MRN: [id number] Date of Birth: 17-Oct-1961 Referring Provider (PT): Kirsteins   Encounter Date: 05/28/2021   PT End of Session - 05/28/21 0801     Visit Number 5    Date for PT Re-Evaluation 08/05/21    PT Start Time 0800    PT Stop Time 0843    PT Time Calculation (min) 43 min    Equipment Utilized During Treatment Gait belt    Activity Tolerance Patient tolerated treatment well    Behavior During Therapy WFL for tasks assessed/performed             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)    Stroke (HCC)    Unspecified essential hypertension     Past Surgical History:  Procedure Laterality Date   BUBBLE STUDY  03/05/2020   Procedure: BUBBLE STUDY;  Surgeon: [name], MD;  Location: [facility] ENDOSCOPY;  Service: Cardiovascular;;   CESAREAN SECTION  1992   IR ANGIO INTRA EXTRACRAN SEL COM CAROTID INNOMINATE BILAT MOD SED  05/11/2021   IR ANGIO VERTEBRAL SEL SUBCLAVIAN INNOMINATE BILAT MOD SED  05/11/2021   LOOP RECORDER INSERTION N/A 03/05/2020   Procedure: LOOP RECORDER INSERTION;  Surgeon: [name], MD;  Location: [facility] INVASIVE CV LAB;  Service: Cardiovascular;  Laterality: N/A;   TEE WITHOUT CARDIOVERSION N/A 03/05/2020   Procedure: TRANSESOPHAGEAL ECHOCARDIOGRAM (TEE);  Surgeon: [name], MD;  Location: [facility] ENDOSCOPY;  Service: Cardiovascular;  Laterality: N/A;    There were no vitals filed for this visit.   Subjective Assessment - 05/28/21 0801     Subjective Denies any pain or falls, everything fine. Feeling better    Patient is accompained by: Family member   husband   Patient  Stated Goals improve balance, strength.    Currently in Pain? No/denies                               [facility] Adult PT Treatment/Exercise - 05/28/21 0001       Ambulation/Gait   Gait Comments amb 192ft x 2 with 1 HHA and gait belt cues for heel strike and symmetrical steps, no foot sliding. trialed x 114ft CGA-Min A for intermittent LOB with turns, without UE support.      Knee/Hip Exercises: Aerobic   Nustep L6 x 5 minutes      Knee/Hip Exercises: Machines for Strengthening   Other Machine 5# 10 x 2 trunk rotaiton pulleys.      Knee/Hip Exercises: Standing   Walking with Sports Cord 20# x5 forward (2 - 1 UE support) , x 4 lateral B (1 UE support) and backward (2UE support)      Knee/Hip Exercises: Seated   Sit to Sand 10 reps;without UE support;2 sets   on airex + holding blue med ball, 2nd set EC , needed facilitation for right WS                Balance Exercises - 05/28/21 0001       Balance Exercises: Standing   Other Standing Exercises Comments R/L toe tap on 3 multicolored cones with x15 -  CGA no UE support   most instability on R LE                PT Short Term Goals - 05/17/21 1751       PT SHORT TERM GOAL #1   Title Independent with initial HEP with family SBA for safety    Time 2    Period Weeks    Status Achieved    Target Date 05/27/21               PT Long Term Goals - 05/17/21 1751       PT LONG TERM GOAL #1   Title Independent with advanced HEP    Time 12    Period Weeks    Status On-going      PT LONG TERM GOAL #2   Title Berg improved to at least 48/56 to demonstrate decreased falls risk    Time 12    Period Weeks    Status On-going      PT LONG TERM GOAL #3   Title TUG improved to </= 10 seconds with SPC or independent, no LOB to demonstrate improved functional gait speed and decreased falls risk    Time 12    Period Weeks    Status On-going      PT LONG TERM GOAL #4   Title Improved BUE and BLE  strength to at least 4+/5    Time 12    Period Weeks    Status On-going                   Plan - 05/28/21 4098     Clinical Impression Statement Pt cotninues to tolerated exercise progressions well. We incorporated some more machine exercises for resisted trunk rotation and for resisted gait. She did require UE support and cueing for stepping forward against resistance. Continues to demo RLE instability in SLS with trendelenberg observed. Toward end of session focused more on gait training both with SPC and without AD and focusing on stymmetrical steps with heel strike bilateral. She would hold UEs rigidly without AD but with cues to focus on steps reciprocal arm swing became more natural.    PT Treatment/Interventions ADLs/Self Care Home Management;Cryotherapy;Electrical Stimulation;Iontophoresis 4mg /ml Dexamethasone;Moist Heat;Functional mobility training;Therapeutic activities;Therapeutic exercise;Balance training;Neuromuscular re-education;Patient/family education;Manual techniques;Energy conservation;Vestibular;Canalith Repostioning;Gait training;Stair training    PT Next Visit Plan BUE/LE and core strengthening, gait/balance stability. Manual/modalities as needed to facilitate progress.    Consulted and Agree with Plan of Care Patient;Family member/caregiver    Family Member Consulted husband             Patient will benefit from skilled therapeutic intervention in order to improve the following deficits and impairments:  Abnormal gait, Decreased range of motion, Difficulty walking, Impaired UE functional use, Increased muscle spasms, Decreased endurance, Pain, Improper body mechanics, Decreased balance, Decreased strength, Decreased mobility  Visit Diagnosis: Muscle weakness (generalized)  Ataxic gait  Unsteadiness on feet  Other lack of coordination  History of CVA (cerebrovascular accident)     Problem List Patient Active Problem List   Diagnosis Date Noted    TIA (transient ischemic attack) 05/09/2021   Chronic diastolic heart failure (grade 1 DD) 05/09/2021   Primary hypertension    Obstructive hydrocephalus (HCC)    Tachycardia    Acute cerebral infarction (HCC)    Visual disturbance as complication of stroke    Transaminitis    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 coli UTI 11/01/2013   Anemia due to blood loss, acute 10/24/2013   Pelvic fracture (HCC) 10/23/2013   Hyperglycemia 09/18/2013   Routine general medical examination at a health care facility 09/18/2013   Elevated alkaline phosphatase level 09/18/2013   Hot flashes, menopausal 04/10/2013   Pain in joint, upper arm 04/10/2013    Anson Crofts, PT, DPT 05/28/2021, 8:46 AM  [facility] [address] [place], [place], [address] Phone: [phone]   Fax:  [phone]  Name: Judy Gutierrez MRN: [id number] Date of Birth: Feb 19, 1961

## 2021-05-31 ENCOUNTER — Ambulatory Visit: Payer: No Typology Code available for payment source | Admitting: Physical Therapy

## 2021-06-02 ENCOUNTER — Ambulatory Visit: Payer: No Typology Code available for payment source

## 2021-06-03 ENCOUNTER — Ambulatory Visit: Payer: No Typology Code available for payment source | Admitting: Family Medicine

## 2021-06-07 ENCOUNTER — Encounter: Payer: No Typology Code available for payment source | Admitting: Speech Pathology

## 2021-06-07 ENCOUNTER — Encounter: Payer: No Typology Code available for payment source | Admitting: Physical Therapy

## 2021-06-07 ENCOUNTER — Encounter: Payer: No Typology Code available for payment source | Admitting: Occupational Therapy

## 2021-06-08 ENCOUNTER — Ambulatory Visit: Payer: No Typology Code available for payment source | Admitting: Rehabilitative and Restorative Service Providers"

## 2021-06-08 ENCOUNTER — Other Ambulatory Visit: Payer: Self-pay

## 2021-06-08 ENCOUNTER — Ambulatory Visit: Payer: No Typology Code available for payment source | Admitting: Occupational Therapy

## 2021-06-08 ENCOUNTER — Encounter: Payer: Self-pay | Admitting: Rehabilitative and Restorative Service Providers"

## 2021-06-08 ENCOUNTER — Encounter: Payer: Self-pay | Admitting: Occupational Therapy

## 2021-06-08 DIAGNOSIS — Z8673 Personal history of transient ischemic attack (TIA), and cerebral infarction without residual deficits: Secondary | ICD-10-CM

## 2021-06-08 DIAGNOSIS — R260 Ataxic gait: Secondary | ICD-10-CM

## 2021-06-08 DIAGNOSIS — M6281 Muscle weakness (generalized): Secondary | ICD-10-CM

## 2021-06-08 DIAGNOSIS — R2681 Unsteadiness on feet: Secondary | ICD-10-CM

## 2021-06-08 DIAGNOSIS — R29818 Other symptoms and signs involving the nervous system: Secondary | ICD-10-CM

## 2021-06-08 DIAGNOSIS — R278 Other lack of coordination: Secondary | ICD-10-CM

## 2021-06-08 NOTE — Therapy (Signed)
[facility] [address] [place], [place], [address] Phone: [phone]   Fax:  [phone]  Physical Therapy Treatment  Patient Details  Name: Judy Gutierrez MRN: [id number] Date of Birth: 1961/07/29 Referring Provider (PT): Kirsteins   Encounter Date: 06/08/2021   PT End of Session - 06/08/21 0903     Visit Number 6    Date for PT Re-Evaluation 08/05/21    PT Start Time 0845    PT Stop Time 0925    PT Time Calculation (min) 40 min    Equipment Utilized During Treatment Gait belt    Activity Tolerance Patient tolerated treatment well    Behavior During Therapy WFL for tasks assessed/performed             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)    Stroke (HCC)    Unspecified essential hypertension     Past Surgical History:  Procedure Laterality Date   BUBBLE STUDY  03/05/2020   Procedure: BUBBLE STUDY;  Surgeon: [name], MD;  Location: [facility] ENDOSCOPY;  Service: Cardiovascular;;   CESAREAN SECTION  1992   IR ANGIO INTRA EXTRACRAN SEL COM CAROTID INNOMINATE BILAT MOD SED  05/11/2021   IR ANGIO VERTEBRAL SEL SUBCLAVIAN INNOMINATE BILAT MOD SED  05/11/2021   LOOP RECORDER INSERTION N/A 03/05/2020   Procedure: LOOP RECORDER INSERTION;  Surgeon: [name], MD;  Location: [facility] INVASIVE CV LAB;  Service: Cardiovascular;  Laterality: N/A;   TEE WITHOUT CARDIOVERSION N/A 03/05/2020   Procedure: TRANSESOPHAGEAL ECHOCARDIOGRAM (TEE);  Surgeon: [name], MD;  Location: [facility] ENDOSCOPY;  Service: Cardiovascular;  Laterality: N/A;    There were no vitals filed for this visit.   Subjective Assessment - 06/08/21 0902     Subjective Pt reports doing well on her week off while she was out of town for a wedding.    Patient Stated Goals improve balance,  strength.    Currently in Pain? No/denies                               [facility] Adult PT Treatment/Exercise - 06/08/21 0001       Ambulation/Gait   Gait Comments Amb 200 ft around PT gym with gait belt and CGA.      Knee/Hip Exercises: Aerobic   Nustep L6 x6 min      Knee/Hip Exercises: Standing   Walking with Sports Cord 20# x5 forward (2 - 1 UE support    Other Standing Knee Exercises Trunk rotation with blue wt ball outstretched 2x5 reps    Other Standing Knee Exercises Alt 2in taps without UE support 2x5 each      Knee/Hip Exercises: Seated   Hamstring Curl Strengthening;Right;2 sets;10 reps    Hamstring Limitations red tband    Sit to Sand 10 reps;without UE support;2 sets   on airex + holding blue med ball, 2nd set EC , needed facilitation for right WS     Knee/Hip Exercises: Prone   Other Prone Exercises Alt UE/LE in quadruped x10 each                      PT Short Term Goals - 05/17/21 1751       PT SHORT TERM GOAL #1  Title Independent with initial HEP with family SBA for safety    Time 2    Period Weeks    Status Achieved    Target Date 05/27/21               PT Long Term Goals - 05/17/21 1751       PT LONG TERM GOAL #1   Title Independent with advanced HEP    Time 12    Period Weeks    Status On-going      PT LONG TERM GOAL #2   Title Berg improved to at least 48/56 to demonstrate decreased falls risk    Time 12    Period Weeks    Status On-going      PT LONG TERM GOAL #3   Title TUG improved to </= 10 seconds with SPC or independent, no LOB to demonstrate improved functional gait speed and decreased falls risk    Time 12    Period Weeks    Status On-going      PT LONG TERM GOAL #4   Title Improved BUE and BLE strength to at least 4+/5    Time 12    Period Weeks    Status On-going                   Plan - 06/08/21 0905     Clinical Impression Statement [name] is making great progress with  skilled PT.  She is improving with her balance with ambulation around PT clinic with only having PT hold onto gait belt instead of HHA and improvements with resisted walking.  She is progressing with standing balance activities without UE support.  At end of session, was able to try sit to stand with LLE slightly ahead to increased weight bearing through RLE with sit to stand.  She continue to require skilled PT to progress towards goal related activities.    PT Treatment/Interventions ADLs/Self Care Home Management;Cryotherapy;Electrical Stimulation;Iontophoresis 4mg /ml Dexamethasone;Moist Heat;Functional mobility training;Therapeutic activities;Therapeutic exercise;Balance training;Neuromuscular re-education;Patient/family education;Manual techniques;Energy conservation;Vestibular;Canalith Repostioning;Gait training;Stair training    PT Next Visit Plan BUE/LE and core strengthening, gait/balance stability. Manual/modalities as needed to facilitate progress.    Consulted and Agree with Plan of Care Patient;Family member/caregiver    Family Member Consulted husband             Patient will benefit from skilled therapeutic intervention in order to improve the following deficits and impairments:  Abnormal gait, Decreased range of motion, Difficulty walking, Impaired UE functional use, Increased muscle spasms, Decreased endurance, Pain, Improper body mechanics, Decreased balance, Decreased strength, Decreased mobility  Visit Diagnosis: Muscle weakness (generalized)  Ataxic gait  Unsteadiness on feet  Other lack of coordination  History of CVA (cerebrovascular accident)     Problem List Patient Active Problem List   Diagnosis Date Noted   TIA (transient ischemic attack) 05/09/2021   Chronic diastolic heart failure (grade 1 DD) 05/09/2021   Primary hypertension    Obstructive hydrocephalus (HCC)    Tachycardia    Acute cerebral infarction [facility])    Visual disturbance as complication of  stroke    Transaminitis    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 coli UTI 11/01/2013   Anemia due to blood loss, acute 10/24/2013   Pelvic fracture (HCC) 10/23/2013   Hyperglycemia 09/18/2013   Routine general medical examination at a health care facility 09/18/2013   Elevated alkaline phosphatase level 09/18/2013   Hot flashes, menopausal 04/10/2013   Pain in joint, upper arm 04/10/2013    Reather Laurence, PT, DPT 06/08/2021, 10:38 AM  [facility] [address] [place], [place], [address] Phone: [phone]   Fax:  [phone]  Name: Judy Gutierrez MRN: [id number] Date of Birth: 1960/11/16

## 2021-06-08 NOTE — Patient Instructions (Signed)
SELF ASSISTED WITH OBJECT: Shoulder Flexion - Sitting    Hold cane with both hands. Raise arms up. 15 reps per set, 2 sets per session, 2-3 sets per day   SELF ASSISTED WITH OBJECT: Shoulder Abduction / Adduction - Sitting    Hold cane with both hands. Move both arms from side to side, keep elbows straight. 15 reps per set, 2 sets per session, 2-3 sets per day. Rotate trunk during exercise.   SELF ASSISTED WITH OBJECT: Shoulder External Rotation - Supine    Hold cane with both hands, keep elbow bent and next to body. Rotate arm away from body. 15 reps per set, 2 sets per session, 2-3 sets per day Add towel to keep elbow at side.  SHOULDER: Extension (Band)    Start with arm slightly forward. Holding band positioned on door, pull backward, past hip, keeping elbow straight. Do not swing arm. 15 reps per set, 2 sets per session, 2-3 sets per day  Copyright  VHI. All rights reserved.

## 2021-06-09 ENCOUNTER — Encounter: Payer: No Typology Code available for payment source | Admitting: Physical Therapy

## 2021-06-09 NOTE — Therapy (Signed)
[facility] [address] [place], [place], [address] Phone: [phone]   Fax:  [phone]  Occupational Therapy Treatment  Patient Details  Name: Judy Gutierrez MRN: [id number] Date of Birth: [birth year] Referring Provider (OT): Claudette Laws, MD   Encounter Date: 06/08/2021   OT End of Session - 06/08/21 0934     Visit Number 3    Number of Visits 9    Date for OT Re-Evaluation 08/19/21    Authorization Type [employer] plan does not cover OT/ST)    OT Start Time 0930    OT Stop Time 1012    OT Time Calculation (min) 42 min    Activity Tolerance Patient tolerated treatment well    Behavior During Therapy WFL for tasks assessed/performed            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)    Stroke (HCC)    Unspecified essential hypertension     Past Surgical History:  Procedure Laterality Date   BUBBLE STUDY  03/05/2020   Procedure: BUBBLE STUDY;  Surgeon: [name], MD;  Location: [facility] ENDOSCOPY;  Service: Cardiovascular;;   CESAREAN SECTION  1992   IR ANGIO INTRA EXTRACRAN SEL COM CAROTID INNOMINATE BILAT MOD SED  05/11/2021   IR ANGIO VERTEBRAL SEL SUBCLAVIAN INNOMINATE BILAT MOD SED  05/11/2021   LOOP RECORDER INSERTION N/A 03/05/2020   Procedure: LOOP RECORDER INSERTION;  Surgeon: [name], MD;  Location: [facility] INVASIVE CV LAB;  Service: Cardiovascular;  Laterality: N/A;   TEE WITHOUT CARDIOVERSION N/A 03/05/2020   Procedure: TRANSESOPHAGEAL ECHOCARDIOGRAM (TEE);  Surgeon: [name], MD;  Location: [facility] ENDOSCOPY;  Service: Cardiovascular;  Laterality: N/A;    There were no vitals filed for this visit.   Subjective Assessment - 06/08/21 0933     Subjective  Pt reports she was able to practice her exercises while  travelling    Patient is accompanied by: Family member   Judy Gutierrez (husband) and brother   Pertinent History R cerebellar CVA 02/22/21, TIA 05/09/21, and hx of cerebellar CVA 01/29/20; HTN, HLD    Patient Stated Goals Drive and return to cleaning/cooking    Currently in Pain? No/denies             Treatment/Exercises - 06/08/21    Neuro Reeducation     Forearm Exercises AROM of R sup/pro completed 2x15 w/ OT providing mod verbal cues/modeling for alignment/positioning to isolate forearm movement (pt demo'ing compensatory patterns w/ trunk/shoulder)    Shoulder Exercises Closed-chain AROM of BUE shoulder flexion w/ PVC pipe square (forearms in neutral) and w/ cane (forearms pronated) to facilitate increased control w/ muscle activation patterns; completed 2 sets of 15 reps in sitting w/ pt requiring verbal cues for pacing and to avoid overstretching. Pt also completed closed-chain AROM of R shoulder abd and ext/int rotation w/ cane to facilitate muscle activation, requiring repetition, modeling, and verbal cues for success w/ alignment and positioning dur exercises    Strengthening    Shoulder Extension Shoulder ext w/ blue theraband (pt brought from home); completed 1 set of 15 reps w/ theraband secured underfoot and 2nd set w/ theraband secured to door handle. Pt able to complete exercises w/ Mod I after initial cues to maintain elbow extension  OT Education - 06/08/21 1301     Education Details Reviewed current UE exercises pt is completing and introduced AROM/strengthening exercises to include in HEP (see pt instructions). Also provided condition-specific education and discussed energy conservation w/ relevant strategies    Person(s) Educated Patient;Spouse    Methods Explanation;Demonstration;Handout;Tactile cues;Verbal cues    Comprehension Verbalized understanding;Returned demonstration;Verbal cues required;Need further instruction             OT Short Term Goals - 05/24/21  0850       OT SHORT TERM GOAL #1   Title Pt will demonstrate independence w/ HEP designed for increased BUE strength and ROM    Time 4    Period Weeks    Status On-going    Target Date 06/18/21      OT SHORT TERM GOAL #2   Title Pt will improve R forearm supination by at least 4 degrees to improve participation in cooking tasks    Baseline R forearm supination 61* (LUE 71*)    Time 4    Period Weeks    Status On-going      OT SHORT TERM GOAL #3   Title Pt will complete 9-HPT w/ R hand in less than 1 minute to demonstrate improvement in functional FM skills w/ R, dominant UE    Baseline R hand 1 min, 6 sec; L hand 28 sec    Time 4    Period Weeks    Status On-going      OT SHORT TERM GOAL #4   Title Pt will improve functional use of RUE as evidenced by completing bilateral integration task (e.g., folding clothes, cutting food) within pt-determined acceptable level of time    Time 4    Period Weeks    Status On-going             OT Long Term Goals - 05/24/21 0851       OT LONG TERM GOAL #1   Title Pt will be independent w/ full HEP designed for increased coordination and functional use of RUE    Time 8    Period Weeks    Status On-going      OT LONG TERM GOAL #2   Title Pt will complete gardening task at Mod I, using AE/compensatory strategies for safety prn    Time 8    Period Weeks    Status On-going      OT LONG TERM GOAL #3   Title Pt will report completing at least 75% of desired household cleaning activities w/ SPV prn for safety    Time 8    Period Weeks    Status On-going             Plan - 06/08/21 1305     Clinical Impression Statement Due to pt's current focus on BUE strengthening w/ exercises she is completing at home, OT facilitated closed-chain AROM w/ pt focusing on quality of movement and control of muscle activation w/ RUE. Pt continued to benefit from modeling, repetition, and verbal cues during exercises this session to decrease speed  and focus on alignment and positioning.    OT Occupational Profile and History Detailed Assessment- Review of Records and additional review of physical, cognitive, psychosocial history related to current functional performance    Occupational performance deficits (Please refer to evaluation for details): ADL's;IADL's;Work;Leisure    Body Structure / Function / Physical Skills ADL;ROM;UE functional use;Balance;Decreased knowledge of use of DME;FMC;Vision;Dexterity;Body mechanics;GMC;Strength;Endurance;Coordination;IADL    Rehab Potential Good  Clinical Decision Making Several treatment options, min-mod task modification necessary    Comorbidities Affecting Occupational Performance: May have comorbidities impacting occupational performance    Modification or Assistance to Complete Evaluation  Min-Moderate modification of tasks or assist with assess necessary to complete eval    OT Frequency 1x / week   Decreased frequency due to insurance coverage   OT Duration 8 weeks    OT Treatment/Interventions Self-care/ADL training;Electrical Stimulation;Therapeutic exercise;Visual/perceptual remediation/compensation;Patient/family education;Splinting;Neuromuscular education;Moist Heat;Energy conservation;Building services engineer;Therapeutic activities;Balance training;Passive range of motion;Manual Therapy;DME and/or AE instruction;Ultrasound;Cryotherapy    Plan Continue w/ [facility] activities (coordination handout); functional bilateral integration activities    Consulted and Agree with Plan of Care Patient;Family member/caregiver    Family Member Consulted Husband (Judy Gutierrez)             Patient will benefit from skilled therapeutic intervention in order to improve the following deficits and impairments:   Body Structure / Function / Physical Skills: ADL, ROM, UE functional use, Balance, Decreased knowledge of use of DME, FMC, Vision, Dexterity, Body mechanics, GMC, Strength, Endurance, Coordination,  IADL   Visit Diagnosis: Other lack of coordination  Muscle weakness (generalized)  Unsteadiness on feet  Other symptoms and signs involving the nervous system    Problem List Patient Active Problem List   Diagnosis Date Noted   TIA (transient ischemic attack) 05/09/2021   Chronic diastolic heart failure (grade 1 DD) 05/09/2021   Primary hypertension    Obstructive hydrocephalus (HCC)    Tachycardia    Acute cerebral infarction (HCC)    Visual disturbance as complication of stroke    Transaminitis    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 coli UTI 11/01/2013   Anemia due to blood loss, acute 10/24/2013   Pelvic fracture (HCC) 10/23/2013   Hyperglycemia 09/18/2013   Routine general medical examination at a health care facility 09/18/2013   Elevated alkaline phosphatase level 09/18/2013   Hot flashes, menopausal 04/10/2013   Pain in joint, upper arm 04/10/2013     Judy Gutierrez, Judy Gutierrez, Judy Gutierrez  06/09/2021, 1:12 PM  [facility] [address] [place], [place], [address] Phone: [phone]   Fax:  [phone]  Name: Judy Gutierrez MRN: [id number] Date of Birth: 09-18-1961

## 2021-06-10 ENCOUNTER — Other Ambulatory Visit: Payer: Self-pay

## 2021-06-10 ENCOUNTER — Encounter: Payer: Self-pay | Admitting: Rehabilitative and Restorative Service Providers"

## 2021-06-10 ENCOUNTER — Ambulatory Visit (INDEPENDENT_AMBULATORY_CARE_PROVIDER_SITE_OTHER): Payer: No Typology Code available for payment source | Admitting: Family Medicine

## 2021-06-10 ENCOUNTER — Encounter: Payer: Self-pay | Admitting: Family Medicine

## 2021-06-10 ENCOUNTER — Ambulatory Visit: Payer: No Typology Code available for payment source | Admitting: Rehabilitative and Restorative Service Providers"

## 2021-06-10 VITALS — BP 110/88 | HR 90 | Temp 98.2°F | Resp 18 | Ht 62.0 in | Wt 122.2 lb

## 2021-06-10 DIAGNOSIS — R29818 Other symptoms and signs involving the nervous system: Secondary | ICD-10-CM

## 2021-06-10 DIAGNOSIS — R739 Hyperglycemia, unspecified: Secondary | ICD-10-CM | POA: Diagnosis not present

## 2021-06-10 DIAGNOSIS — I10 Essential (primary) hypertension: Secondary | ICD-10-CM | POA: Diagnosis not present

## 2021-06-10 DIAGNOSIS — R260 Ataxic gait: Secondary | ICD-10-CM

## 2021-06-10 DIAGNOSIS — M6281 Muscle weakness (generalized): Secondary | ICD-10-CM

## 2021-06-10 DIAGNOSIS — D509 Iron deficiency anemia, unspecified: Secondary | ICD-10-CM | POA: Diagnosis not present

## 2021-06-10 DIAGNOSIS — Z8673 Personal history of transient ischemic attack (TIA), and cerebral infarction without residual deficits: Secondary | ICD-10-CM

## 2021-06-10 DIAGNOSIS — R278 Other lack of coordination: Secondary | ICD-10-CM

## 2021-06-10 DIAGNOSIS — R2681 Unsteadiness on feet: Secondary | ICD-10-CM

## 2021-06-10 LAB — CBC WITH DIFFERENTIAL/PLATELET
Basophils Absolute: 0 10*3/uL (ref 0.0–0.1)
Basophils Relative: 0.4 % (ref 0.0–3.0)
Eosinophils Absolute: 0.2 10*3/uL (ref 0.0–0.7)
Eosinophils Relative: 3.2 % (ref 0.0–5.0)
HCT: 36.3 % (ref 36.0–46.0)
Hemoglobin: 12.3 g/dL (ref 12.0–15.0)
Lymphocytes Relative: 28.1 % (ref 12.0–46.0)
Lymphs Abs: 1.5 10*3/uL (ref 0.7–4.0)
MCHC: 33.9 g/dL (ref 30.0–36.0)
MCV: 90.6 fl (ref 78.0–100.0)
Monocytes Absolute: 0.4 10*3/uL (ref 0.1–1.0)
Monocytes Relative: 8.1 % (ref 3.0–12.0)
Neutro Abs: 3.1 10*3/uL (ref 1.4–7.7)
Neutrophils Relative %: 60.2 % (ref 43.0–77.0)
Platelets: 234 10*3/uL (ref 150.0–400.0)
RBC: 4 Mil/uL (ref 3.87–5.11)
RDW: 12.9 % (ref 11.5–15.5)
WBC: 5.2 10*3/uL (ref 4.0–10.5)

## 2021-06-10 LAB — IBC + FERRITIN
Ferritin: 28.7 ng/mL (ref 10.0–291.0)
Iron: 80 ug/dL (ref 42–145)
Saturation Ratios: 20.6 % (ref 20.0–50.0)
TIBC: 387.8 ug/dL (ref 250.0–450.0)
Transferrin: 277 mg/dL (ref 212.0–360.0)

## 2021-06-10 LAB — COMPREHENSIVE METABOLIC PANEL
ALT: 56 U/L — ABNORMAL HIGH (ref 0–35)
AST: 50 U/L — ABNORMAL HIGH (ref 0–37)
Albumin: 4 g/dL (ref 3.5–5.2)
Alkaline Phosphatase: 79 U/L (ref 39–117)
BUN: 17 mg/dL (ref 6–23)
CO2: 30 mEq/L (ref 19–32)
Calcium: 9.7 mg/dL (ref 8.4–10.5)
Chloride: 103 mEq/L (ref 96–112)
Creatinine, Ser: 0.74 mg/dL (ref 0.40–1.20)
GFR: 88.33 mL/min (ref >60.00)
Glucose, Bld: 113 mg/dL — ABNORMAL HIGH (ref 70–99)
Potassium: 4.8 mEq/L (ref 3.5–5.1)
Sodium: 139 mEq/L (ref 135–145)
Total Bilirubin: 1.3 mg/dL — ABNORMAL HIGH (ref 0.2–1.2)
Total Protein: 7.5 g/dL (ref 6.0–8.3)

## 2021-06-10 LAB — HEMOGLOBIN A1C: Hgb A1c MFr Bld: 5.7 % (ref 4.6–6.5)

## 2021-06-10 NOTE — Assessment & Plan Note (Signed)
Check labs 

## 2021-06-10 NOTE — Patient Instructions (Signed)
https://www.nhlbi.nih.gov/files/docs/public/heart/dash_brief.pdf">  DASH Eating Plan DASH stands for Dietary Approaches to Stop Hypertension. The DASH eating plan is a healthy eating plan that has been shown to: Reduce high blood pressure (hypertension). Reduce your risk for type 2 diabetes, heart disease, and stroke. Help with weight loss. What are tips for following this plan? Reading food labels Check food labels for the amount of salt (sodium) per serving. Choose foods with less than 5 percent of the Daily Value of sodium. Generally, foods with less than 300 milligrams (mg) of sodium per serving fit into this eating plan. To find whole grains, look for the word "whole" as the first word in the ingredient list. Shopping Buy products labeled as "low-sodium" or "no salt added." Buy fresh foods. Avoid canned foods and pre-made or frozen meals. Cooking Avoid adding salt when cooking. Use salt-free seasonings or herbs instead of table salt or sea salt. Check with your health care provider or pharmacist before using salt substitutes. Do not fry foods. Cook foods using healthy methods such as baking, boiling, grilling, roasting, and broiling instead. Cook with heart-healthy oils, such as olive, canola, avocado, soybean, or sunflower oil. Meal planning  Eat a balanced diet that includes: 4 or more servings of fruits and 4 or more servings of vegetables each day. Try to fill one-half of your plate with fruits and vegetables. 6-8 servings of whole grains each day. Less than 6 oz (170 g) of lean meat, poultry, or fish each day. A 3-oz (85-g) serving of meat is about the same size as a deck of cards. One egg equals 1 oz (28 g). 2-3 servings of low-fat dairy each day. One serving is 1 cup (237 mL). 1 serving of nuts, seeds, or beans 5 times each week. 2-3 servings of heart-healthy fats. Healthy fats called omega-3 fatty acids are found in foods such as walnuts, flaxseeds, fortified milks, and eggs.  These fats are also found in cold-water fish, such as sardines, salmon, and mackerel. Limit how much you eat of: Canned or prepackaged foods. Food that is high in trans fat, such as some fried foods. Food that is high in saturated fat, such as fatty meat. Desserts and other sweets, sugary drinks, and other foods with added sugar. Full-fat dairy products. Do not salt foods before eating. Do not eat more than 4 egg yolks a week. Try to eat at least 2 vegetarian meals a week. Eat more home-cooked food and less restaurant, buffet, and fast food.  Lifestyle When eating at a restaurant, ask that your food be prepared with less salt or no salt, if possible. If you drink alcohol: Limit how much you use to: 0-1 drink a day for women who are not pregnant. 0-2 drinks a day for men. Be aware of how much alcohol is in your drink. In the [place] one drink equals one 12 oz bottle of beer (355 mL), one 5 oz glass of wine (148 mL), or one 1 oz glass of hard liquor (44 mL). General information Avoid eating more than 2,300 mg of salt a day. If you have hypertension, you may need to reduce your sodium intake to 1,500 mg a day. Work with your health care provider to maintain a healthy body weight or to lose weight. Ask what an ideal weight is for you. Get at least 30 minutes of exercise that causes your heart to beat faster (aerobic exercise) most days of the week. Activities may include walking, swimming, or biking. Work with your health care provider   or dietitian to adjust your eating plan to your individual calorie needs. What foods should I eat? Fruits All fresh, dried, or frozen fruit. Canned fruit in natural juice (without addedsugar). Vegetables Fresh or frozen vegetables (raw, steamed, roasted, or grilled). Low-sodium or reduced-sodium tomato and vegetable juice. Low-sodium or reduced-sodium tomatosauce and tomato paste. Low-sodium or reduced-sodium canned vegetables. Grains Whole-grain or  whole-wheat bread. Whole-grain or whole-wheat pasta. Brown rice. Oatmeal. Quinoa. Bulgur. Whole-grain and low-sodium cereals. Pita bread.Low-fat, low-sodium crackers. Whole-wheat flour tortillas. Meats and other proteins Skinless chicken or turkey. Ground chicken or turkey. Pork with fat trimmed off. Fish and seafood. Egg whites. Dried beans, peas, or lentils. Unsalted nuts, nut butters, and seeds. Unsalted canned beans. Lean cuts of beef with fat trimmed off. Low-sodium, lean precooked or cured meat, such as sausages or meatloaves. Dairy Low-fat (1%) or fat-free (skim) milk. Reduced-fat, low-fat, or fat-free cheeses. Nonfat, low-sodium ricotta or cottage cheese. Low-fat or nonfatyogurt. Low-fat, low-sodium cheese. Fats and oils Soft margarine without trans fats. Vegetable oil. Reduced-fat, low-fat, or light mayonnaise and salad dressings (reduced-sodium). Canola, safflower, olive, avocado, soybean, andsunflower oils. Avocado. Seasonings and condiments Herbs. Spices. Seasoning mixes without salt. Other foods Unsalted popcorn and pretzels. Fat-free sweets. The items listed above may not be a complete list of foods and beverages you can eat. Contact a dietitian for more information. What foods should I avoid? Fruits Canned fruit in a light or heavy syrup. Fried fruit. Fruit in cream or buttersauce. Vegetables Creamed or fried vegetables. Vegetables in a cheese sauce. Regular canned vegetables (not low-sodium or reduced-sodium). Regular canned tomato sauce and paste (not low-sodium or reduced-sodium). Regular tomato and vegetable juice(not low-sodium or reduced-sodium). Pickles. Olives. Grains Baked goods made with fat, such as croissants, muffins, or some breads. Drypasta or rice meal packs. Meats and other proteins Fatty cuts of meat. Ribs. Fried meat. Bacon. Bologna, salami, and other precooked or cured meats, such as sausages or meat loaves. Fat from the back of a pig (fatback). Bratwurst.  Salted nuts and seeds. Canned beans with added salt. Canned orsmoked fish. Whole eggs or egg yolks. Chicken or turkey with skin. Dairy Whole or 2% milk, cream, and half-and-half. Whole or full-fat cream cheese. Whole-fat or sweetened yogurt. Full-fat cheese. Nondairy creamers. Whippedtoppings. Processed cheese and cheese spreads. Fats and oils Butter. Stick margarine. Lard. Shortening. Ghee. Bacon fat. Tropical oils, suchas coconut, palm kernel, or palm oil. Seasonings and condiments Onion salt, garlic salt, seasoned salt, table salt, and sea salt. Worcestershire sauce. Tartar sauce. Barbecue sauce. Teriyaki sauce. Soy sauce, including reduced-sodium. Steak sauce. Canned and packaged gravies. Fish sauce. Oyster sauce. Cocktail sauce. Store-bought horseradish. Ketchup. Mustard. Meat flavorings and tenderizers. Bouillon cubes. Hot sauces. Pre-made or packaged marinades. Pre-made or packaged taco seasonings. Relishes. Regular saladdressings. Other foods Salted popcorn and pretzels. The items listed above may not be a complete list of foods and beverages you should avoid. Contact a dietitian for more information. Where to find more information National Heart, Lung, and Blood Institute: www.nhlbi.nih.gov American Heart Association: www.heart.org Academy of Nutrition and Dietetics: www.eatright.org National Kidney Foundation: www.kidney.org Summary The DASH eating plan is a healthy eating plan that has been shown to reduce high blood pressure (hypertension). It may also reduce your risk for type 2 diabetes, heart disease, and stroke. When on the DASH eating plan, aim to eat more fresh fruits and vegetables, whole grains, lean proteins, low-fat dairy, and heart-healthy fats. With the DASH eating plan, you should limit salt (sodium) intake to 2,300   mg a day. If you have hypertension, you may need to reduce your sodium intake to 1,500 mg a day. Work with your health care provider or dietitian to adjust  your eating plan to your individual calorie needs. This information is not intended to replace advice given to you by your health care provider. Make sure you discuss any questions you have with your healthcare provider. Document Revised: 09/06/2019 Document Reviewed: 09/06/2019 Elsevier Patient Education  2022 [employer]  

## 2021-06-10 NOTE — Assessment & Plan Note (Signed)
Well controlled, no changes to meds. Encouraged heart healthy diet such as the DASH diet and exercise as tolerated.  °

## 2021-06-10 NOTE — Progress Notes (Signed)
Subjective:   By signing my name below, I, Shehryar Baig, attest that this documentation has been prepared under the direction and in the presence of Dr. Seabron Spates, DO. 06/10/2021     Patient ID: Judy Gutierrez, female    DOB: 06-01-1961, 60 y.o.   MRN: [id number]  Chief Complaint  Patient presents with   Hypertension    Pt states not checking bp at home    Follow-up    HPI Patient is in today for a office visit. She is present with her husband during this visit to assist with transportation.  She reports having constipation after she takes her iron supplements.  She reports that having a bruise on her lower legs after getting a massage. She notes that she is starting bruise easily.  She is doing well during this visit.  She continues attending physical therapy and occupational therapy sessions and reports doing well.  Her blood pressure is doing well during this visit. She continues taking 5 mg amlodipine daily PO and reports no new issues while taking it.   BP Readings from Last 3 Encounters:  06/10/21 110/88  05/11/21 (!) 142/79  05/07/21 134/81   Her cholesterol levels are well during her last lab work. She continues taking 40 mg Lipitor daily PO and reports no new issues while taking it.   Lab Results  Component Value Date   CHOL 87 05/10/2021   HDL 43 05/10/2021   LDLCALC 41 05/10/2021   TRIG 16 05/10/2021   CHOLHDL 2.0 05/10/2021   Her last blood sugar levels measured during her stay in the hospital was elevated. She is willing to get her blood sugar levels checked during her next blood work. She reports lowering her sugar intake with her diet.   Lab Results  Component Value Date   HGBA1C 6.1 (H) 05/10/2021   She is willing to get the flu vaccine at a later date.   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)     Stroke [facility])    Unspecified essential hypertension     Past Surgical History:  Procedure Laterality Date   BUBBLE STUDY  03/05/2020   Procedure: BUBBLE STUDY;  Surgeon: [name], MD;  Location: [facility] ENDOSCOPY;  Service: Cardiovascular;;   CESAREAN SECTION  1992   IR ANGIO INTRA EXTRACRAN SEL COM CAROTID INNOMINATE BILAT MOD SED  05/11/2021   IR ANGIO VERTEBRAL SEL SUBCLAVIAN INNOMINATE BILAT MOD SED  05/11/2021   LOOP RECORDER INSERTION N/A 03/05/2020   Procedure: LOOP RECORDER INSERTION;  Surgeon: [name], MD;  Location: [facility] INVASIVE CV LAB;  Service: Cardiovascular;  Laterality: N/A;   TEE WITHOUT CARDIOVERSION N/A 03/05/2020   Procedure: TRANSESOPHAGEAL ECHOCARDIOGRAM (TEE);  Surgeon: [name], MD;  Location: [facility] ENDOSCOPY;  Service: Cardiovascular;  Laterality: N/A;    Family History  Problem Relation Age of Onset   Pulmonary embolism Mother    Hepatitis Father    Diabetes Father    Diabetes Mellitus II Sister    Breast cancer Neg Hx     Social History   Socioeconomic History   Marital status: Married    Spouse name: Not on file   Number of children: 1   Years of education: Not on file   Highest education level: Not on file  Occupational History   Occupation: convenience store  Comment: self employed  Tobacco Use   Smoking status: Never   Smokeless tobacco: Never  Substance and Sexual Activity   Alcohol use: No   Drug use: No   Sexual activity: Yes  Other Topics Concern   Not on file  Social History Narrative   Lives with husband   Right Handed   Drinks caffeine seldom   Social Determinants of Health   Financial Resource Strain: Not on file  Food Insecurity: Not on file  Transportation Needs: Not on file  Physical Activity: Not on file  Stress: Not on file  Social Connections: Not on file  Intimate Partner Violence: Not on file    Outpatient Medications Prior to Visit  Medication Sig Dispense Refill   acetaminophen  (TYLENOL) 325 MG tablet Take 325 mg by mouth every 6 (six) hours as needed for mild pain or headache.     amLODipine (NORVASC) 5 MG tablet Take 1 tablet (5 mg total) by mouth daily. 90 tablet 3   aspirin 81 MG EC tablet Take 1 tablet (81 mg total) by mouth daily. 90 tablet 1   atorvastatin (LIPITOR) 40 MG tablet Take 2 tablets (80 mg total) by mouth every evening.     Calcium Carbonate-Vit D-Min (CALCIUM 1200 PO) Take 1 tablet by mouth daily.     clopidogrel (PLAVIX) 75 MG tablet Take 1 tablet (75 mg total) by mouth daily. 30 tablet 2   Ferrous Sulfate (IRON PO) Take 1 tablet by mouth daily.     Multiple Vitamin (MULTIVITAMIN WITH MINERALS) TABS tablet Take 1 tablet by mouth daily.     No facility-administered medications prior to visit.    No Known Allergies  Review of Systems  Constitutional:  Negative for chills, fever and malaise/fatigue.  HENT:  Negative for congestion and hearing loss.   Eyes:  Negative for blurred vision and discharge.  Respiratory:  Negative for cough, sputum production and shortness of breath.   Cardiovascular:  Negative for chest pain, palpitations and leg swelling.  Gastrointestinal:  Negative for abdominal pain, blood in stool, constipation, diarrhea, heartburn, nausea and vomiting.  Genitourinary:  Negative for dysuria, frequency, hematuria and urgency.  Musculoskeletal:  Negative for back pain, falls and myalgias.  Skin:  Negative for rash.  Neurological:  Negative for dizziness, sensory change, loss of consciousness, weakness and headaches.  Endo/Heme/Allergies:  Negative for environmental allergies. Bruises/bleeds easily.  Psychiatric/Behavioral:  Negative for depression and suicidal ideas. The patient is not nervous/anxious and does not have insomnia.       Objective:    Physical Exam Vitals and nursing note reviewed.  Constitutional:      General: She is not in acute distress.    Appearance: Normal appearance. She is not ill-appearing.  HENT:      Head: Normocephalic and atraumatic.     Right Ear: External ear normal.     Left Ear: External ear normal.  Eyes:     Extraocular Movements: Extraocular movements intact.     Pupils: Pupils are equal, round, and reactive to light.  Cardiovascular:     Rate and Rhythm: Normal rate and regular rhythm.     Heart sounds: Normal heart sounds. No murmur heard.   No gallop.  Pulmonary:     Effort: Pulmonary effort is normal. No respiratory distress.     Breath sounds: Normal breath sounds. No wheezing or rales.  Musculoskeletal:     Comments: Continues using cane to assist with walking  Skin:    General:  Skin is warm and dry.  Neurological:     Mental Status: She is alert and oriented to person, place, and time.  Psychiatric:        Mood and Affect: Mood normal.        Behavior: Behavior normal.        Thought Content: Thought content normal.        Judgment: Judgment normal.    BP 110/88 (BP Location: Right Arm, Patient Position: Sitting, Cuff Size: Normal)   Pulse 90   Temp 98.2 F (36.8 C) (Oral)   Resp 18   Ht 5\' 2"  (1.575 m)   Wt 122 lb 3.2 oz (55.4 kg)   LMP 12/08/2010   SpO2 98%   BMI 22.35 kg/m  Wt Readings from Last 3 Encounters:  06/10/21 122 lb 3.2 oz (55.4 kg)  05/09/21 126 lb 15.8 oz (57.6 kg)  05/07/21 123 lb (55.8 kg)    Diabetic Foot Exam - Simple   No data filed    Lab Results  Component Value Date   WBC 8.4 05/09/2021   HGB 12.4 05/09/2021   HCT 36.5 05/09/2021   PLT 235 05/09/2021   GLUCOSE 140 (H) 05/09/2021   CHOL 87 05/10/2021   TRIG 16 05/10/2021   HDL 43 05/10/2021   LDLCALC 41 05/10/2021   ALT 37 05/09/2021   AST 38 05/09/2021   NA 135 05/09/2021   K 3.9 05/09/2021   CL 100 05/09/2021   CREATININE 0.72 05/09/2021   BUN 16 05/09/2021   CO2 28 05/09/2021   TSH 3.37 12/04/2020   INR 1.0 05/09/2021   HGBA1C 6.1 (H) 05/10/2021   MICROALBUR 0.8 12/04/2020    Lab Results  Component Value Date   TSH 3.37 12/04/2020   Lab Results   Component Value Date   WBC 8.4 05/09/2021   HGB 12.4 05/09/2021   HCT 36.5 05/09/2021   MCV 89.5 05/09/2021   PLT 235 05/09/2021   Lab Results  Component Value Date   NA 135 05/09/2021   K 3.9 05/09/2021   CO2 28 05/09/2021   GLUCOSE 140 (H) 05/09/2021   BUN 16 05/09/2021   CREATININE 0.72 05/09/2021   BILITOT 1.0 05/09/2021   ALKPHOS 79 05/09/2021   AST 38 05/09/2021   ALT 37 05/09/2021   PROT 7.9 05/09/2021   ALBUMIN 3.9 05/09/2021   CALCIUM 9.0 05/09/2021   ANIONGAP 7 05/09/2021   GFR 98.75 12/04/2020   Lab Results  Component Value Date   CHOL 87 05/10/2021   Lab Results  Component Value Date   HDL 43 05/10/2021   Lab Results  Component Value Date   LDLCALC 41 05/10/2021   Lab Results  Component Value Date   TRIG 16 05/10/2021   Lab Results  Component Value Date   CHOLHDL 2.0 05/10/2021   Lab Results  Component Value Date   HGBA1C 6.1 (H) 05/10/2021       Assessment & Plan:   Problem List Items Addressed This Visit       Unprioritized   Essential hypertension    Well controlled, no changes to meds. Encouraged heart healthy diet such as the DASH diet and exercise as tolerated.       Hyperglycemia - Primary    Recheck labs       Relevant Orders   Comprehensive metabolic panel   Hemoglobin A1c   Iron deficiency anemia    Check labs       Relevant Orders   CBC  with Differential/Platelet   IBC + Ferritin     No orders of the defined types were placed in this encounter.   I, Dr. Seabron SpatesLowne-Chase, Ameliya Nicotra, DO, personally preformed the services described in this documentation.  All medical record entries made by the scribe were at my direction and in my presence.  I have reviewed the chart and discharge instructions (if applicable) and agree that the record reflects my personal performance and is accurate and complete. 06/10/2021   I,Shehryar Baig,acting as a scribe for Donato SchultzYvonne R Lowne Chase, DO.,have documented all relevant documentation on  the behalf of Donato SchultzYvonne R Lowne Chase, DO,as directed by  Donato SchultzYvonne R Lowne Chase, DO while in the presence of Donato SchultzYvonne R Lowne Chase, DO.   Donato SchultzYvonne R Lowne Chase, DO

## 2021-06-10 NOTE — Therapy (Signed)
[facility] [address] [place], [place], [address] Phone: [phone]   Fax:  [phone]  Physical Therapy Treatment  Patient Details  Name: Judy Gutierrez MRN: [id number] Date of Birth: 1961-10-05 Referring Provider (PT): Kirsteins   Encounter Date: 06/10/2021   PT End of Session - 06/10/21 1032     Visit Number 7    Date for PT Re-Evaluation 08/05/21    PT Start Time 1022    PT Stop Time 1100    PT Time Calculation (min) 38 min    Activity Tolerance Patient tolerated treatment well    Behavior During Therapy WFL for tasks assessed/performed             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)    Stroke [facility])    Unspecified essential hypertension     Past Surgical History:  Procedure Laterality Date   BUBBLE STUDY  03/05/2020   Procedure: BUBBLE STUDY;  Surgeon: [name], MD;  Location: [facility] ENDOSCOPY;  Service: Cardiovascular;;   CESAREAN SECTION  1992   IR ANGIO INTRA EXTRACRAN SEL COM CAROTID INNOMINATE BILAT MOD SED  05/11/2021   IR ANGIO VERTEBRAL SEL SUBCLAVIAN INNOMINATE BILAT MOD SED  05/11/2021   LOOP RECORDER INSERTION N/A 03/05/2020   Procedure: LOOP RECORDER INSERTION;  Surgeon: [name], MD;  Location: [facility] INVASIVE CV LAB;  Service: Cardiovascular;  Laterality: N/A;   TEE WITHOUT CARDIOVERSION N/A 03/05/2020   Procedure: TRANSESOPHAGEAL ECHOCARDIOGRAM (TEE);  Surgeon: [name], MD;  Location: [facility] ENDOSCOPY;  Service: Cardiovascular;  Laterality: N/A;    There were no vitals filed for this visit.   Subjective Assessment - 06/10/21 1031     Subjective I jus thad blood drawn at the doctor's office.  I am doing okay.    Patient Stated Goals improve balance, strength.    Currently in Pain? No/denies                                [facility] Adult PT Treatment/Exercise - 06/10/21 0001       Ambulation/Gait   Gait Comments Amb 200 ft around PT clinic with SBA with no loss of balance      High Level Balance   High Level Balance Comments Tandem gait and side stepping on AirEx beam x4 laps each.      Knee/Hip Exercises: Aerobic   Nustep L6 x6 min      Knee/Hip Exercises: Seated   Hamstring Curl Strengthening;2 sets;10 reps;Both    Hamstring Limitations red tband    Sit to Sand 10 reps;without UE support   yellow wt ball x10 with chest press, x10 with OHP                     PT Short Term Goals - 05/17/21 1751       PT SHORT TERM GOAL #1   Title Independent with initial HEP with family SBA for safety    Time 2    Period Weeks    Status Achieved    Target Date 05/27/21               PT Long Term Goals - 06/10/21 1140       PT LONG TERM GOAL #  1   Title Independent with advanced HEP    Status On-going      PT LONG TERM GOAL #2   Title Berg improved to at least 48/56 to demonstrate decreased falls risk    Status On-going                   Plan - 06/10/21 1133     Clinical Impression Statement Pt arrived late to appointment secondary to a MD appointment prior.  She did well with PT session and was able to progress to ambulating around clinic during visit without assistive device with improved balance.  She continues to progress with strength and balance during session, but does require cuing during exercises to slow down for improved eccentric muscle control and activation.  She continues to require skilled PT to progress towards goal related activities.    PT Treatment/Interventions ADLs/Self Care Home Management;Cryotherapy;Electrical Stimulation;Iontophoresis 4mg /ml Dexamethasone;Moist Heat;Functional mobility training;Therapeutic activities;Therapeutic exercise;Balance training;Neuromuscular re-education;Patient/family education;Manual  techniques;Energy conservation;Vestibular;Canalith Repostioning;Gait training;Stair training    PT Next Visit Plan BUE/LE and core strengthening, gait/balance stability. Manual/modalities as needed to facilitate progress.    Consulted and Agree with Plan of Care Patient;Family member/caregiver    Family Member Consulted husband             Patient will benefit from skilled therapeutic intervention in order to improve the following deficits and impairments:  Abnormal gait, Decreased range of motion, Difficulty walking, Impaired UE functional use, Increased muscle spasms, Decreased endurance, Pain, Improper body mechanics, Decreased balance, Decreased strength, Decreased mobility  Visit Diagnosis: Other lack of coordination  Muscle weakness (generalized)  Unsteadiness on feet  Other symptoms and signs involving the nervous system  Ataxic gait  History of CVA (cerebrovascular accident)     Problem List Patient Active Problem List   Diagnosis Date Noted   TIA (transient ischemic attack) 05/09/2021   Chronic diastolic heart failure (grade 1 DD) 05/09/2021   Primary hypertension    Obstructive hydrocephalus (HCC)    Tachycardia    Acute cerebral infarction (HCC)    Visual disturbance as complication of stroke    Transaminitis    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 coli UTI 11/01/2013   Anemia due to blood loss, acute 10/24/2013   Pelvic fracture (HCC) 10/23/2013    Hyperglycemia 09/18/2013   Routine general medical examination at a health care facility 09/18/2013   Elevated alkaline phosphatase level 09/18/2013   Hot flashes, menopausal 04/10/2013   Pain in joint, upper arm 04/10/2013    04/12/2013, PT, DPT 06/10/2021, 11:42 AM  [facility] [address] [place], [place], [place] Phone: [phone]   Fax:  [phone]  Name: Judy Gutierrez MRN: Iva Boop Date of Birth: [birth year]

## 2021-06-10 NOTE — Assessment & Plan Note (Signed)
Recheck labs 

## 2021-06-11 ENCOUNTER — Encounter: Payer: Self-pay | Admitting: Family Medicine

## 2021-06-14 ENCOUNTER — Encounter: Payer: Self-pay | Admitting: Occupational Therapy

## 2021-06-14 ENCOUNTER — Other Ambulatory Visit: Payer: Self-pay

## 2021-06-14 ENCOUNTER — Other Ambulatory Visit: Payer: Self-pay | Admitting: Family Medicine

## 2021-06-14 ENCOUNTER — Ambulatory Visit: Payer: No Typology Code available for payment source | Admitting: Physical Therapy

## 2021-06-14 ENCOUNTER — Encounter: Payer: Self-pay | Admitting: Physical Therapy

## 2021-06-14 ENCOUNTER — Ambulatory Visit: Payer: No Typology Code available for payment source | Admitting: Occupational Therapy

## 2021-06-14 DIAGNOSIS — M6281 Muscle weakness (generalized): Secondary | ICD-10-CM

## 2021-06-14 DIAGNOSIS — R260 Ataxic gait: Secondary | ICD-10-CM

## 2021-06-14 DIAGNOSIS — Z8673 Personal history of transient ischemic attack (TIA), and cerebral infarction without residual deficits: Secondary | ICD-10-CM | POA: Diagnosis not present

## 2021-06-14 DIAGNOSIS — R748 Abnormal levels of other serum enzymes: Secondary | ICD-10-CM

## 2021-06-14 DIAGNOSIS — R2681 Unsteadiness on feet: Secondary | ICD-10-CM

## 2021-06-14 DIAGNOSIS — R278 Other lack of coordination: Secondary | ICD-10-CM

## 2021-06-14 DIAGNOSIS — R29818 Other symptoms and signs involving the nervous system: Secondary | ICD-10-CM

## 2021-06-14 NOTE — Therapy (Signed)
[facility] [address] [place], [place], [address] Phone: [phone]   Fax:  [phone]  Physical Therapy Treatment  Patient Details  Name: Judy Gutierrez MRN: [id number] Date of Birth: 1961/04/10 Referring Provider (PT): Kirsteins   Encounter Date: 06/14/2021   PT End of Session - 06/14/21 1013     Visit Number 8    Date for PT Re-Evaluation 08/05/21    PT Start Time 0934    PT Stop Time 1012    PT Time Calculation (min) 38 min    Equipment Utilized During Treatment Gait belt    Activity Tolerance Patient tolerated treatment well    Behavior During Therapy WFL for tasks assessed/performed             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)    Stroke (HCC)    Unspecified essential hypertension     Past Surgical History:  Procedure Laterality Date   BUBBLE STUDY  03/05/2020   Procedure: BUBBLE STUDY;  Surgeon: [name], MD;  Location: [facility] ENDOSCOPY;  Service: Cardiovascular;;   CESAREAN SECTION  1992   IR ANGIO INTRA EXTRACRAN SEL COM CAROTID INNOMINATE BILAT MOD SED  05/11/2021   IR ANGIO VERTEBRAL SEL SUBCLAVIAN INNOMINATE BILAT MOD SED  05/11/2021   LOOP RECORDER INSERTION N/A 03/05/2020   Procedure: LOOP RECORDER INSERTION;  Surgeon: [name], MD;  Location: [facility] INVASIVE CV LAB;  Service: Cardiovascular;  Laterality: N/A;   TEE WITHOUT CARDIOVERSION N/A 03/05/2020   Procedure: TRANSESOPHAGEAL ECHOCARDIOGRAM (TEE);  Surgeon: [name], MD;  Location: [facility] ENDOSCOPY;  Service: Cardiovascular;  Laterality: N/A;    There were no vitals filed for this visit.   Subjective Assessment - 06/14/21 0930     Subjective "I'm fine." Balance is okay. Has some trouble with it if she looks up/down too long.    Patient is accompained by: Family  member   husband   Patient Stated Goals improve balance, strength.    Currently in Pain? No/denies                               [facility] Adult PT Treatment/Exercise - 06/14/21 0001       Neuro Re-ed    Neuro Re-ed Details  R/L tall kneeling sidesteps 2x each with/without red TB above knees along mat; standing vertical and horizontal VOR 2x10   no dizziness; difficulty focusing and slow     Exercises   Exercises Lumbar      Lumbar Exercises: Seated   Other Seated Lumbar Exercises sitting on pball march 2x20, alt AQ 2x10   CGA and cue to weight shift before moving     Lumbar Exercises: Supine   Dead Bug 10 reps    Dead Bug Limitations pball on belly   2nd set with 2# at UEs and LEs     Lumbar Exercises: Quadruped   Opposite Arm/Leg Raise Right arm/Left leg;Left arm/Right leg;10 reps   R hip drop evident; shakiness/ataxia present     Knee/Hip Exercises: Aerobic   Nustep L6 x6 min (UEs/LEs)                    PT Education - 06/14/21 1013     Education Details update to  HEP    Person(s) Educated Patient;Spouse    Methods Explanation;Demonstration;Tactile cues;Verbal cues;Handout    Comprehension Verbalized understanding;Returned demonstration              PT Short Term Goals - 05/17/21 1751       PT SHORT TERM GOAL #1   Title Independent with initial HEP with family SBA for safety    Time 2    Period Weeks    Status Achieved    Target Date 05/27/21               PT Long Term Goals - 06/10/21 1140       PT LONG TERM GOAL #1   Title Independent with advanced HEP    Status On-going      PT LONG TERM GOAL #2   Title Berg improved to at least 48/56 to demonstrate decreased falls risk    Status On-going                   Plan - 06/14/21 1014     Clinical Impression Statement Patient arrived to session with report of some instability with cervical extension/flexion. Worked on core stability and coordination exercises  at beginning of session. Patient demonstrated good ability to maintain neutral spine and good coordination in supine, most challenge in quadruped. More instability was demonstrated in quadruped as well. Worked on tall kneeling hip strengthening with good tolerance. Sitting balance and core stability exercises revealed moderate instability and patient required CGA and cues to weight shift before moving. Trialed standing VOR with patient demonstrating slow movement and difficulty focusing. Updated this into HEP for continued practice. Patient and husband reported understanding and without complaints at end of session. Patient is progressing well towards goals.    PT Treatment/Interventions ADLs/Self Care Home Management;Cryotherapy;Electrical Stimulation;Iontophoresis 4mg /ml Dexamethasone;Moist Heat;Functional mobility training;Therapeutic activities;Therapeutic exercise;Balance training;Neuromuscular re-education;Patient/family education;Manual techniques;Energy conservation;Vestibular;Canalith Repostioning;Gait training;Stair training    PT Next Visit Plan BUE/LE and core strengthening, gait/balance stability. Manual/modalities as needed to facilitate progress.    Consulted and Agree with Plan of Care Patient;Family member/caregiver    Family Member Consulted husband             Patient will benefit from skilled therapeutic intervention in order to improve the following deficits and impairments:  Abnormal gait, Decreased range of motion, Difficulty walking, Impaired UE functional use, Increased muscle spasms, Decreased endurance, Pain, Improper body mechanics, Decreased balance, Decreased strength, Decreased mobility  Visit Diagnosis: Muscle weakness (generalized)  Unsteadiness on feet  Other symptoms and signs involving the nervous system  Ataxic gait     Problem List Patient Active Problem List   Diagnosis Date Noted   Iron deficiency anemia 06/10/2021   TIA (transient ischemic  attack) 05/09/2021   Chronic diastolic heart failure (grade 1 DD) 05/09/2021   Primary hypertension    Obstructive hydrocephalus (HCC)    Tachycardia    Acute cerebral infarction [facility])    Visual disturbance as complication of stroke    Transaminitis    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 coli UTI 11/01/2013   Anemia due to blood loss, acute 10/24/2013   Pelvic fracture (HCC) 10/23/2013   Hyperglycemia 09/18/2013   Routine general medical examination at a health care facility 09/18/2013   Elevated alkaline phosphatase level 09/18/2013   Hot flashes, menopausal 04/10/2013   Pain in joint, upper arm 04/10/2013     Anette Guarneri, PT, DPT 06/14/21 11:01 AM    [facility] [place] [facility] [address] [place], [place], [address] Phone: [phone]   Fax:  [phone]  Name: Kimmie Berggren MRN: [id number] Date of Birth: 07/25/1961

## 2021-06-14 NOTE — Therapy (Signed)
[facility] [address]. [place], [place], [address] Phone: [phone]   Fax:  [phone]  Occupational Therapy Treatment  Patient Details  Name: Judy Gutierrez MRN: [id number] Date of Birth: 15-Jun-1961 Referring Provider (OT): Claudette Laws, MD   Encounter Date: 06/14/2021   OT End of Session - 06/14/21 0852     Visit Number 4    Number of Visits 9    Date for OT Re-Evaluation 08/19/21    Authorization Type [employer] plan does not cover OT/ST)    OT Start Time 0848    OT Stop Time 0930    OT Time Calculation (min) 42 min    Activity Tolerance Patient tolerated treatment well    Behavior During Therapy WFL for tasks assessed/performed            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)    Stroke (HCC)    Unspecified essential hypertension     Past Surgical History:  Procedure Laterality Date   BUBBLE STUDY  03/05/2020   Procedure: BUBBLE STUDY;  Surgeon: [name], MD;  Location: [facility] ENDOSCOPY;  Service: Cardiovascular;;   CESAREAN SECTION  1992   IR ANGIO INTRA EXTRACRAN SEL COM CAROTID INNOMINATE BILAT MOD SED  05/11/2021   IR ANGIO VERTEBRAL SEL SUBCLAVIAN INNOMINATE BILAT MOD SED  05/11/2021   LOOP RECORDER INSERTION N/A 03/05/2020   Procedure: LOOP RECORDER INSERTION;  Surgeon: [name], MD;  Location: [facility] INVASIVE CV LAB;  Service: Cardiovascular;  Laterality: N/A;   TEE WITHOUT CARDIOVERSION N/A 03/05/2020   Procedure: TRANSESOPHAGEAL ECHOCARDIOGRAM (TEE);  Surgeon: [name], MD;  Location: [facility] ENDOSCOPY;  Service: Cardiovascular;  Laterality: N/A;    There were no vitals filed for this visit.   Subjective Assessment - 06/14/21 0851     Subjective  "Ive been exercising w/ the hammer, band, and cane every  day"    Patient is accompanied by: Family member   Ajay (husband) and brother   Pertinent History R cerebellar CVA 02/22/21, TIA 05/09/21, and hx of cerebellar CVA 01/29/20; HTN, HLD    Patient Stated Goals Drive and return to cleaning/cooking    Currently in Pain? No/denies             Treatment/Exercises - 06/14/21 0859    Pegboard Activity  Copying pattern onto pegboard w/ easy-grip pegs to facilitate FMC, alternating attention, hand strengthening, and eye-hand coordination; pt able to complete activity w/ 2 verbal cues for color misidentification and no v/c for spatial orientation  Picking up marbles 3 at a time, translating each to fingertips, and placing on easy-grip pegs to facilitate in-hand manipulation, FMC, and intrinsic hand strengthening. Completed w/ 3 drop(s); increased success with decreased speed.  To return marbles to container, pt used resistive clothespin (2# and 4#) to retrieve marbles (> 5 drops); pt experienced greater success w/ clothespins of increased resistance vs. lower         OT Education - 06/14/21 0858     Education Details Continued coordination activities to include in HEP; flipping cards, pushing w/ thumb, rotating in-hand (pt returned demo'd w/ handout to be administered next session); continued condition-specific education    Person(s) Educated Patient;Spouse    Methods Explanation;Demonstration    Comprehension Verbalized understanding;Returned demonstration;Need further instruction  OT Short Term Goals - 06/14/21 0916       OT SHORT TERM GOAL #1   Title Pt will demonstrate independence w/ HEP designed for increased BUE strength and ROM    Time 4    Period Weeks    Status On-going    Target Date 06/18/21      OT SHORT TERM GOAL #2   Title Pt will improve R forearm supination by at least 4 degrees to improve participation in cooking tasks    Baseline R forearm supination 61* (LUE 71*)    Time 4    Period Weeks    Status  Achieved   06/14/21 - 70*     OT SHORT TERM GOAL #3   Title Pt will complete 9-HPT w/ R hand in less than 1 minute to demonstrate improvement in functional FM skills w/ R, dominant UE    Baseline R hand 1 min, 6 sec; L hand 28 sec    Time 4    Period Weeks    Status Achieved   06/14/21 - 51 sec (15 sec improvement)     OT SHORT TERM GOAL #4   Title Pt will improve functional use of RUE as evidenced by completing bilateral integration task (e.g., folding clothes, cutting food) within pt-determined acceptable level of time    Time 4    Period Weeks    Status On-going             OT Long Term Goals - 05/24/21 0851       OT LONG TERM GOAL #1   Title Pt will be independent w/ full HEP designed for increased coordination and functional use of RUE    Time 8    Period Weeks    Status On-going      OT LONG TERM GOAL #2   Title Pt will complete gardening task at Mod I, using AE/compensatory strategies for safety prn    Time 8    Period Weeks    Status On-going      OT LONG TERM GOAL #3   Title Pt will report completing at least 75% of desired household cleaning activities w/ SPV prn for safety    Time 8    Period Weeks    Status On-going             Plan - 06/14/21 4097     Clinical Impression Statement Pt is progressing well toward goals, meeting STGs 2 and 3 this session, and reports continued compliance w/ HEP at home. Due to improvements pt demo'd w/ FM skills and in-hand manipulation during pegboard activity, OT re-assessed 9HPT w/ pt decreasing time w/ R hand by 15 sec since evaluation session. Pt also demo'd increased retrieving marbles w/ higher resistance clothespin, likely due to decreased demand on control/coordination and force gradation.    OT Occupational Profile and History Detailed Assessment- Review of Records and additional review of physical, cognitive, psychosocial history related to current functional performance    Occupational performance deficits (Please  refer to evaluation for details): ADL's;IADL's;Work;Leisure    Body Structure / Function / Physical Skills ADL;ROM;UE functional use;Balance;Decreased knowledge of use of DME;FMC;Vision;Dexterity;Body mechanics;GMC;Strength;Endurance;Coordination;IADL    Rehab Potential Good    Clinical Decision Making Several treatment options, min-mod task modification necessary    Comorbidities Affecting Occupational Performance: May have comorbidities impacting occupational performance    Modification or Assistance to Complete Evaluation  Min-Moderate modification of tasks or assist with assess necessary to complete eval  OT Frequency 1x / week   Decreased frequency due to insurance coverage   OT Duration 8 weeks    OT Treatment/Interventions Self-care/ADL training;Electrical Stimulation;Therapeutic exercise;Visual/perceptual remediation/compensation;Patient/family education;Splinting;Neuromuscular education;Moist Heat;Energy conservation;Building services engineer;Therapeutic activities;Balance training;Passive range of motion;Manual Therapy;DME and/or AE instruction;Ultrasound;Cryotherapy    Plan Continue w/ [facility] activities (coordination handout); functional bilateral integration activities    Consulted and Agree with Plan of Care Patient;Family member/caregiver    Family Member Consulted Husband (Ajay)             Patient will benefit from skilled therapeutic intervention in order to improve the following deficits and impairments:   Body Structure / Function / Physical Skills: ADL, ROM, UE functional use, Balance, Decreased knowledge of use of DME, FMC, Vision, Dexterity, Body mechanics, GMC, Strength, Endurance, Coordination, IADL   Visit Diagnosis: Other lack of coordination  Muscle weakness (generalized)  Other symptoms and signs involving the nervous system    Problem List Patient Active Problem List   Diagnosis Date Noted   Iron deficiency anemia 06/10/2021   TIA (transient ischemic  attack) 05/09/2021   Chronic diastolic heart failure (grade 1 DD) 05/09/2021   Primary hypertension    Obstructive hydrocephalus (HCC)    Tachycardia    Acute cerebral infarction (HCC)    Visual disturbance as complication of stroke    Transaminitis    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 coli UTI 11/01/2013   Anemia due to blood loss, acute 10/24/2013   Pelvic fracture (HCC) 10/23/2013   Hyperglycemia 09/18/2013   Routine general medical examination at a health care facility 09/18/2013   Elevated alkaline phosphatase level 09/18/2013   Hot flashes, menopausal 04/10/2013   Pain in joint, upper arm 04/10/2013     Rosie Fate, OTR/L, MSOT  06/14/2021, 1:26 PM  [facility] [address] [place], [place], [address] Phone: [phone]   Fax:  [phone]  Name: Judy Gutierrez MRN: [id number] Date of Birth: Mar 08, 1961

## 2021-06-15 NOTE — Telephone Encounter (Signed)
Patient would like to know what to do since she has not been taking the tylenol. We set up her labs for two weeks, but she doesn't know what medication she needs to stop since she was never taking the tylenol to begin with. Please advice.

## 2021-06-15 NOTE — Progress Notes (Signed)
Carelink Summary Report / Loop Recorder 

## 2021-06-16 ENCOUNTER — Encounter: Payer: No Typology Code available for payment source | Admitting: Physical Therapy

## 2021-06-16 NOTE — Telephone Encounter (Signed)
Pt not taking tylenol. Do you want patient to stop taking Lipitor for 2 weeks?

## 2021-06-18 ENCOUNTER — Encounter: Payer: Self-pay | Admitting: Physical Therapy

## 2021-06-18 ENCOUNTER — Other Ambulatory Visit: Payer: Self-pay

## 2021-06-18 ENCOUNTER — Ambulatory Visit
Payer: No Typology Code available for payment source | Attending: Physical Medicine & Rehabilitation | Admitting: Physical Therapy

## 2021-06-18 DIAGNOSIS — R2681 Unsteadiness on feet: Secondary | ICD-10-CM | POA: Insufficient documentation

## 2021-06-18 DIAGNOSIS — R278 Other lack of coordination: Secondary | ICD-10-CM | POA: Diagnosis present

## 2021-06-18 DIAGNOSIS — Z8673 Personal history of transient ischemic attack (TIA), and cerebral infarction without residual deficits: Secondary | ICD-10-CM | POA: Insufficient documentation

## 2021-06-18 DIAGNOSIS — R260 Ataxic gait: Secondary | ICD-10-CM | POA: Diagnosis present

## 2021-06-18 DIAGNOSIS — M6281 Muscle weakness (generalized): Secondary | ICD-10-CM | POA: Insufficient documentation

## 2021-06-18 DIAGNOSIS — R29818 Other symptoms and signs involving the nervous system: Secondary | ICD-10-CM | POA: Diagnosis present

## 2021-06-18 NOTE — Therapy (Signed)
[facility] [place] [facility] [address] [place], [place], [address] Phone: [phone]   Fax:  [phone]  Physical Therapy Treatment  Patient Details  Name: Judy Gutierrez MRN: [id number] Date of Birth: 12/29/1960 Referring Provider (PT): Kirsteins   Encounter Date: 06/18/2021   PT End of Session - 06/18/21 0844     Visit Number 9    Date for PT Re-Evaluation 08/05/21    PT Start Time 0800    PT Stop Time 0843    PT Time Calculation (min) 43 min    Equipment Utilized During Treatment Gait belt    Activity Tolerance Patient tolerated treatment well    Behavior During Therapy WFL for tasks assessed/performed             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)    Stroke (HCC)    Unspecified essential hypertension     Past Surgical History:  Procedure Laterality Date   BUBBLE STUDY  03/05/2020   Procedure: BUBBLE STUDY;  Surgeon: [name], MD;  Location: [facility] ENDOSCOPY;  Service: Cardiovascular;;   CESAREAN SECTION  1992   IR ANGIO INTRA EXTRACRAN SEL COM CAROTID INNOMINATE BILAT MOD SED  05/11/2021   IR ANGIO VERTEBRAL SEL SUBCLAVIAN INNOMINATE BILAT MOD SED  05/11/2021   LOOP RECORDER INSERTION N/A 03/05/2020   Procedure: LOOP RECORDER INSERTION;  Surgeon: [name], MD;  Location: [facility] INVASIVE CV LAB;  Service: Cardiovascular;  Laterality: N/A;   TEE WITHOUT CARDIOVERSION N/A 03/05/2020   Procedure: TRANSESOPHAGEAL ECHOCARDIOGRAM (TEE);  Surgeon: [name], MD;  Location: [facility] ENDOSCOPY;  Service: Cardiovascular;  Laterality: N/A;    There were no vitals filed for this visit.   Subjective Assessment - 06/18/21 0803     Subjective HEP going well. Notices that she bruises easily d/t being on blood thinners.    Patient Stated Goals improve balance,  strength.    Currently in Pain? No/denies                               [facility] Adult PT Treatment/Exercise - 06/18/21 0001       Neuro Re-ed    Neuro Re-ed Details  STS on foam 10x, on foam with EC 10x; standing on foam VOR cancellation 5x each; R/L backwards and lateral stepping over cone with CGA/min A; walking on toes/heels with CGA 4x65ft; tandem walk 2x77ft      Lumbar Exercises: Aerobic   Nustep L5 x 6 min (UEs/LEs)      Lumbar Exercises: Quadruped   Opposite Arm/Leg Raise Right arm/Left leg;Left arm/Right leg;10 reps   cues to avoid anteiror pelvic tilt; more instability when R LE stabilizing                   PT Education - 06/18/21 0844     Education Details update to HEP; advised to remove exercises that have become easy    Person(s) Educated Patient    Methods Explanation;Demonstration;Tactile cues;Verbal cues;Handout    Comprehension Verbalized understanding;Returned demonstration              PT Short Term Goals - 05/17/21 1751       PT SHORT TERM GOAL #1   Title Independent with initial HEP with family SBA for  safety    Time 2    Period Weeks    Status Achieved    Target Date 05/27/21               PT Long Term Goals - 06/10/21 1140       PT LONG TERM GOAL #1   Title Independent with advanced HEP    Status On-going      PT LONG TERM GOAL #2   Title Berg improved to at least 48/56 to demonstrate decreased falls risk    Status On-going                   Plan - 06/18/21 0844     Clinical Impression Statement Patient arrived to session without new complaints. Worked on STS transitions with compliant surface and without gaze fixation. Patient still with core ataxia but with improved unsteadiness after cues to shift anteriorly over toes and contract core to avoid posterior LOB. Stepping over obstacles required CGA/min A d/t imbalance and cues required to slightly decrease step length and increase step  height. Patient reporting a few of her HEP exercises lacking challenge, thus updated today's exercises into HEP- patient and husband reported understanding. Also increased challenge with heel/toe raises by asking patient to walk on heels/toes with consistent cueing required to increase step length, core contraction, and encourage R knee/hip flexion. Patient tolerated duration of session well and without complaints upon leaving.    PT Treatment/Interventions ADLs/Self Care Home Management;Cryotherapy;Electrical Stimulation;Iontophoresis 4mg /ml Dexamethasone;Moist Heat;Functional mobility training;Therapeutic activities;Therapeutic exercise;Balance training;Neuromuscular re-education;Patient/family education;Manual techniques;Energy conservation;Vestibular;Canalith Repostioning;Gait training;Stair training    PT Next Visit Plan BUE/LE and core strengthening, gait/balance stability. Manual/modalities as needed to facilitate progress.    Consulted and Agree with Plan of Care Patient;Family member/caregiver    Family Member Consulted husband             Patient will benefit from skilled therapeutic intervention in order to improve the following deficits and impairments:  Abnormal gait, Decreased range of motion, Difficulty walking, Impaired UE functional use, Increased muscle spasms, Decreased endurance, Pain, Improper body mechanics, Decreased balance, Decreased strength, Decreased mobility  Visit Diagnosis: Muscle weakness (generalized)  Other symptoms and signs involving the nervous system  Unsteadiness on feet  Ataxic gait     Problem List Patient Active Problem List   Diagnosis Date Noted   Iron deficiency anemia 06/10/2021   TIA (transient ischemic attack) 05/09/2021   Chronic diastolic heart failure (grade 1 DD) 05/09/2021   Primary hypertension    Obstructive hydrocephalus (HCC)    Tachycardia    Acute cerebral infarction [facility])    Visual disturbance as complication of stroke     Transaminitis    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 coli UTI 11/01/2013   Anemia due to blood loss, acute 10/24/2013   Pelvic fracture (HCC) 10/23/2013   Hyperglycemia 09/18/2013   Routine general medical examination at a health care facility 09/18/2013   Elevated alkaline phosphatase level 09/18/2013  Hot flashes, menopausal 04/10/2013   Pain in joint, upper arm 04/10/2013     Anette Guarneri, PT, DPT 06/18/21 8:48 AM    [facility] [facility] [address] [place], [place], [address] Phone: [phone]   Fax:  [phone]  Name: Judy Gutierrez MRN: [id number] Date of Birth: 11-17-1960

## 2021-06-22 ENCOUNTER — Ambulatory Visit: Payer: No Typology Code available for payment source | Admitting: Rehabilitative and Restorative Service Providers"

## 2021-06-22 ENCOUNTER — Encounter: Payer: Self-pay | Admitting: Rehabilitative and Restorative Service Providers"

## 2021-06-22 ENCOUNTER — Other Ambulatory Visit: Payer: Self-pay

## 2021-06-22 ENCOUNTER — Ambulatory Visit (INDEPENDENT_AMBULATORY_CARE_PROVIDER_SITE_OTHER): Payer: No Typology Code available for payment source

## 2021-06-22 DIAGNOSIS — Z8673 Personal history of transient ischemic attack (TIA), and cerebral infarction without residual deficits: Secondary | ICD-10-CM

## 2021-06-22 DIAGNOSIS — I639 Cerebral infarction, unspecified: Secondary | ICD-10-CM | POA: Diagnosis not present

## 2021-06-22 DIAGNOSIS — R2681 Unsteadiness on feet: Secondary | ICD-10-CM

## 2021-06-22 DIAGNOSIS — M6281 Muscle weakness (generalized): Secondary | ICD-10-CM | POA: Diagnosis not present

## 2021-06-22 DIAGNOSIS — R278 Other lack of coordination: Secondary | ICD-10-CM

## 2021-06-22 DIAGNOSIS — R260 Ataxic gait: Secondary | ICD-10-CM

## 2021-06-22 DIAGNOSIS — R29818 Other symptoms and signs involving the nervous system: Secondary | ICD-10-CM

## 2021-06-22 NOTE — Therapy (Signed)
[facility] [place] [facility] [address] [place], [place], [address] Phone: [phone]   Fax:  [phone]  Physical Therapy Treatment  Patient Details  Name: Judy Gutierrez MRN: [id number] Date of Birth: 24-Jun-1961 Referring Provider (PT): Kirsteins  Progress Note Reporting Period 05/13/2021 to 06/22/2021  See note below for Objective Data and Assessment of Progress/Goals.      Encounter Date: 06/22/2021   PT End of Session - 06/22/21 0845     Visit Number 10    Date for PT Re-Evaluation 08/05/21    PT Start Time 0841    PT Stop Time 0921    PT Time Calculation (min) 40 min    Activity Tolerance Patient tolerated treatment well    Behavior During Therapy WFL for tasks assessed/performed             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)    Stroke (HCC)    Unspecified essential hypertension     Past Surgical History:  Procedure Laterality Date   BUBBLE STUDY  03/05/2020   Procedure: BUBBLE STUDY;  Surgeon: [name], MD;  Location: [facility] ENDOSCOPY;  Service: Cardiovascular;;   CESAREAN SECTION  1992   IR ANGIO INTRA EXTRACRAN SEL COM CAROTID INNOMINATE BILAT MOD SED  05/11/2021   IR ANGIO VERTEBRAL SEL SUBCLAVIAN INNOMINATE BILAT MOD SED  05/11/2021   LOOP RECORDER INSERTION N/A 03/05/2020   Procedure: LOOP RECORDER INSERTION;  Surgeon: [name], MD;  Location[facility] INVASIVE CV LAB;  Service: Cardiovascular;  Laterality: N/A;   TEE WITHOUT CARDIOVERSION N/A 03/05/2020   Procedure: TRANSESOPHAGEAL ECHOCARDIOGRAM (TEE);  Surgeon: [name], MD;  Location: [facility] ENDOSCOPY;  Service: Cardiovascular;  Laterality: N/A;    There were no vitals filed for this visit.   Subjective Assessment - 06/22/21 0843     Subjective Pt reports that she was able to go out  with her husband this weekend.    Patient Stated Goals improve balance, strength.    Currently in Pain? No/denies                               [facility] Adult PT Treatment/Exercise - 06/22/21 0001       Transfers   Five time sit to stand comments  15.6 sec from black mat      Ambulation/Gait   Gait Comments Amb 200 ft around PT clinic with SBA with no loss of balance      Standardized Balance Assessment   Standardized Balance Assessment Timed Up and Go Test      Timed Up and Go Test   TUG Normal TUG    Normal TUG (seconds) 20   with SPC     High Level Balance   High Level Balance Comments Side stepping in gym without UE support x2 laps each way.Tandem gait and side stepping on AirEx beam x4 laps each.      Neuro Re-ed    Neuro Re-ed Details  Heel walking and toe walking x2 laps each.  Standing on AirEx performing ball toss.      Lumbar Exercises: Aerobic   Nustep L5 x 6 min (UEs/LEs)      Lumbar Exercises: Standing   Functional Squats 10 reps   standing on AirEx  Knee/Hip Exercises: Seated   Sit to Sand 10 reps;without UE support   yellow wt ball x10 with chest press, x10 with OHP                     PT Short Term Goals - 05/17/21 1751       PT SHORT TERM GOAL #1   Title Independent with initial HEP with family SBA for safety    Time 2    Period Weeks    Status Achieved    Target Date 05/27/21               PT Long Term Goals - 06/22/21 0926       PT LONG TERM GOAL #1   Title Independent with advanced HEP    Status On-going      PT LONG TERM GOAL #2   Title Berg improved to at least 48/56 to demonstrate decreased falls risk    Status On-going      PT LONG TERM GOAL #3   Title TUG improved to </= 10 seconds with SPC or independent, no LOB to demonstrate improved functional gait speed and decreased falls risk    Status On-going      PT LONG TERM GOAL #4   Title Improved BUE and BLE strength to at least 4+/5     Status On-going                   Plan - 06/22/21 0923     Clinical Impression Statement [name] continues to progress well with ther ex, she does admit that she is still feeling weakness on her one side, especially with side stepping.  She has more difficulty with heel walking than she does toe walking.  She requires CGA with heel/toe walking and cuing for increased step length.  Pt continues to have compiance with HEP.  Some difficulty with standing ball toss on AirEx with tosses outside of immediate reach on R side.    PT Treatment/Interventions ADLs/Self Care Home Management;Cryotherapy;Electrical Stimulation;Iontophoresis 4mg /ml Dexamethasone;Moist Heat;Functional mobility training;Therapeutic activities;Therapeutic exercise;Balance training;Neuromuscular re-education;Patient/family education;Manual techniques;Energy conservation;Vestibular;Canalith Repostioning;Gait training;Stair training    PT Next Visit Plan BUE/LE and core strengthening, gait/balance stability. Manual/modalities as needed to facilitate progress.    Consulted and Agree with Plan of Care Patient             Patient will benefit from skilled therapeutic intervention in order to improve the following deficits and impairments:  Abnormal gait, Decreased range of motion, Difficulty walking, Impaired UE functional use, Increased muscle spasms, Decreased endurance, Pain, Improper body mechanics, Decreased balance, Decreased strength, Decreased mobility  Visit Diagnosis: Muscle weakness (generalized)  Other symptoms and signs involving the nervous system  Unsteadiness on feet  Ataxic gait  Other lack of coordination  History of CVA (cerebrovascular accident)     Problem List Patient Active Problem List   Diagnosis Date Noted   Iron deficiency anemia 06/10/2021   TIA (transient ischemic attack) 05/09/2021   Chronic diastolic heart failure (grade 1 DD) 05/09/2021   Primary hypertension    Obstructive  hydrocephalus (HCC)    Tachycardia    Acute cerebral infarction [facility])    Visual disturbance as complication of stroke    Transaminitis    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 coli UTI 11/01/2013   Anemia due to blood loss, acute 10/24/2013   Pelvic fracture (HCC) 10/23/2013   Hyperglycemia 09/18/2013   Routine general medical examination at a health care facility 09/18/2013   Elevated alkaline phosphatase level 09/18/2013   Hot flashes, menopausal 04/10/2013   Pain in joint, upper arm 04/10/2013    Reather Laurence, PT, DPT 06/22/2021, 9:28 AM  [facility] [place] [facility] [address] [place], [place], [address] Phone: [phone]   Fax:  [phone]  Name: Judy Gutierrez MRN: [id number] Date of Birth: 1961/06/03

## 2021-06-23 LAB — CUP PACEART REMOTE DEVICE CHECK
Date Time Interrogation Session: 20220903230837
Implantable Pulse Generator Implant Date: 20210520

## 2021-06-25 ENCOUNTER — Ambulatory Visit: Payer: No Typology Code available for payment source | Admitting: Occupational Therapy

## 2021-06-25 ENCOUNTER — Encounter: Payer: Self-pay | Admitting: Rehabilitative and Restorative Service Providers"

## 2021-06-25 ENCOUNTER — Ambulatory Visit: Payer: No Typology Code available for payment source | Admitting: Rehabilitative and Restorative Service Providers"

## 2021-06-25 ENCOUNTER — Encounter: Payer: Self-pay | Admitting: Occupational Therapy

## 2021-06-25 ENCOUNTER — Other Ambulatory Visit: Payer: Self-pay

## 2021-06-25 DIAGNOSIS — R278 Other lack of coordination: Secondary | ICD-10-CM

## 2021-06-25 DIAGNOSIS — R260 Ataxic gait: Secondary | ICD-10-CM

## 2021-06-25 DIAGNOSIS — R2681 Unsteadiness on feet: Secondary | ICD-10-CM

## 2021-06-25 DIAGNOSIS — M6281 Muscle weakness (generalized): Secondary | ICD-10-CM

## 2021-06-25 DIAGNOSIS — R29818 Other symptoms and signs involving the nervous system: Secondary | ICD-10-CM

## 2021-06-25 DIAGNOSIS — Z8673 Personal history of transient ischemic attack (TIA), and cerebral infarction without residual deficits: Secondary | ICD-10-CM

## 2021-06-25 NOTE — Therapy (Signed)
[facility] [address]. [place], [place], [address] Phone: [phone]   Fax:  [phone]  Physical Therapy Treatment  Patient Details  Name: Judy Gutierrez MRN: [id number] Date of Birth: 07/10/1961 Referring Provider (PT): Kirsteins   Encounter Date: 06/25/2021   PT End of Session - 06/25/21 0848     Visit Number 11    Date for PT Re-Evaluation 08/05/21    PT Start Time 0845    PT Stop Time 0925    PT Time Calculation (min) 40 min    Activity Tolerance Patient tolerated treatment well    Behavior During Therapy [facility] for tasks assessed/performed             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)    Stroke [facility])    Unspecified essential hypertension     Past Surgical History:  Procedure Laterality Date   BUBBLE STUDY  03/05/2020   Procedure: BUBBLE STUDY;  Surgeon: [name], MD;  Location: [facility] ENDOSCOPY;  Service: Cardiovascular;;   CESAREAN SECTION  1992   IR ANGIO INTRA EXTRACRAN SEL COM CAROTID INNOMINATE BILAT MOD SED  05/11/2021   IR ANGIO VERTEBRAL SEL SUBCLAVIAN INNOMINATE BILAT MOD SED  05/11/2021   LOOP RECORDER INSERTION N/A 03/05/2020   Procedure: LOOP RECORDER INSERTION;  Surgeon: [name], MD;  Location: [facility] INVASIVE CV LAB;  Service: Cardiovascular;  Laterality: N/A;   TEE WITHOUT CARDIOVERSION N/A 03/05/2020   Procedure: TRANSESOPHAGEAL ECHOCARDIOGRAM (TEE);  Surgeon: [name], MD;  Location: [facility] ENDOSCOPY;  Service: Cardiovascular;  Laterality: N/A;    There were no vitals filed for this visit.   Subjective Assessment - 06/25/21 0847     Subjective Pt reports that she is okay.    Currently in Pain? No/denies                               [facility] Adult PT Treatment/Exercise - 06/25/21 0001        Ambulation/Gait   Ambulation/Gait Yes    Ambulation/Gait Assistance 4: Min guard    Ambulation Distance (Feet) 200 Feet    Assistive device None    Gait Pattern Decreased arm swing - right;Decreased trunk rotation    Ambulation Surface Outdoor;Grass;Paved    Curb 5: Supervision    Curb Details (indicate cue type and reason) 2 curbs outside of PT clinic      High Level Balance   High Level Balance Comments Standing on AirEx performing ball toss.      Lumbar Exercises: Aerobic   Nustep L6 x 6 min (UEs/LEs)      Knee/Hip Exercises: Standing   Walking with Sports Cord 20# 4 way x3 each with CGA    Other Standing Knee Exercises Shoulder extension 5# 2x10 B.  A/R Press 5# x10 B    Other Standing Knee Exercises Alt 4in taps without UE support 2x10 each      Knee/Hip Exercises: Seated   Sit to Sand 10 reps;without UE support   on airex holding yellow wt ball                      PT Short Term Goals - 05/17/21 1751       PT SHORT TERM GOAL #  1   Title Independent with initial HEP with family SBA for safety    Time 2    Period Weeks    Status Achieved    Target Date 05/27/21               PT Long Term Goals - 06/22/21 0926       PT LONG TERM GOAL #1   Title Independent with advanced HEP    Status On-going      PT LONG TERM GOAL #2   Title Berg improved to at least 48/56 to demonstrate decreased falls risk    Status On-going      PT LONG TERM GOAL #3   Title TUG improved to </= 10 seconds with SPC or independent, no LOB to demonstrate improved functional gait speed and decreased falls risk    Status On-going      PT LONG TERM GOAL #4   Title Improved BUE and BLE strength to at least 4+/5    Status On-going                   Plan - 06/25/21 0931     Clinical Impression Statement Ms Swartz continues to progress with ther ex and improved balance.  Progressed ambulation outside over grassy/pine needle area, curbs, and sidewalks with CGA.  Pt had  occasional 'balance checks' where PT assisted to stabilize pt to prevent LOB.  Progressed with some standing exercises to facilitate balance/core stability to allow her to perform more functional activities in the home.    PT Treatment/Interventions ADLs/Self Care Home Management;Cryotherapy;Electrical Stimulation;Iontophoresis 4mg /ml Dexamethasone;Moist Heat;Functional mobility training;Therapeutic activities;Therapeutic exercise;Balance training;Neuromuscular re-education;Patient/family education;Manual techniques;Energy conservation;Vestibular;Canalith Repostioning;Gait training;Stair training    PT Next Visit Plan BUE/LE and core strengthening, gait/balance stability. Manual/modalities as needed to facilitate progress.    Consulted and Agree with Plan of Care Patient             Patient will benefit from skilled therapeutic intervention in order to improve the following deficits and impairments:  Abnormal gait, Decreased range of motion, Difficulty walking, Impaired UE functional use, Increased muscle spasms, Decreased endurance, Pain, Improper body mechanics, Decreased balance, Decreased strength, Decreased mobility  Visit Diagnosis: Muscle weakness (generalized)  Other symptoms and signs involving the nervous system  Unsteadiness on feet  Ataxic gait  Other lack of coordination  History of CVA (cerebrovascular accident)     Problem List Patient Active Problem List   Diagnosis Date Noted   Iron deficiency anemia 06/10/2021   TIA (transient ischemic attack) 05/09/2021   Chronic diastolic heart failure (grade 1 DD) 05/09/2021   Primary hypertension    Obstructive hydrocephalus (HCC)    Tachycardia    Acute cerebral infarction (HCC)    Visual disturbance as complication of stroke    Transaminitis    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 coli UTI 11/01/2013   Anemia due to blood loss, acute 10/24/2013   Pelvic fracture (HCC) 10/23/2013   Hyperglycemia  09/18/2013   Routine general medical examination at a health care facility 09/18/2013   Elevated alkaline phosphatase level 09/18/2013   Hot flashes, menopausal 04/10/2013   Pain in joint, upper arm 04/10/2013    Reather Laurence, PT 06/25/2021, 9:35 AM  [facility] [address]. [place], [place], [address] Phone: [phone]   Fax:  [phone]  Name: Judy Gutierrez MRN: [id number] Date of Birth: 1961/03/26

## 2021-06-26 NOTE — Therapy (Signed)
[facility]. [place], [place], [address] Phone: [phone]   Fax:  [phone]  Occupational Therapy Treatment  Patient Details  Name: Judy Gutierrez MRN: [id number] Date of Birth: April 19, 1961 Referring Provider (OT): Alysia Penna, MD   Encounter Date: 06/25/2021   OT End of Session - 06/25/21 0803     Visit Number 5    Number of Visits 9    Date for OT Re-Evaluation 08/19/21    Authorization Type [employer] plan does not cover OT/ST)    OT Start Time 0801    OT Stop Time [id number]    OT Time Calculation (min) 42 min    Activity Tolerance Patient tolerated treatment well    Behavior During Therapy WFL for tasks assessed/performed            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)    Stroke ([place])    Unspecified essential hypertension     Past Surgical History:  Procedure Laterality Date   BUBBLE STUDY  03/05/2020   Procedure: BUBBLE STUDY;  Surgeon: Donato Heinz, MD;  Location: [place];  Service: Cardiovascular;;   CESAREAN SECTION  1992   IR ANGIO INTRA EXTRACRAN SEL COM CAROTID INNOMINATE BILAT MOD SED  05/11/2021   IR ANGIO VERTEBRAL SEL SUBCLAVIAN INNOMINATE BILAT MOD SED  05/11/2021   LOOP RECORDER INSERTION N/A 03/05/2020   Procedure: LOOP RECORDER INSERTION;  Surgeon: Deboraha Sprang, MD;  Location: [facility] CV LAB;  Service: Cardiovascular;  Laterality: N/A;   TEE WITHOUT CARDIOVERSION N/A 03/05/2020   Procedure: TRANSESOPHAGEAL ECHOCARDIOGRAM (TEE);  Surgeon: Donato Heinz, MD;  Location: [facility] ENDOSCOPY;  Service: Cardiovascular;  Laterality: N/A;    There were no vitals filed for this visit.   Subjective Assessment - 06/25/21 0802     Subjective  "Everything is fine"    Patient is accompanied by: Family  member   Ajay (husband) and brother   Pertinent History R cerebellar CVA 02/22/21, TIA 05/09/21, and hx of cerebellar CVA 01/29/20; HTN, HLD    Patient [place] and return to cleaning/cooking    Currently in Pain? No/denies             Treatment/Exercises - 06/25/21    Neuromuscular Reeducation Scapular retraction in quadruped w/ initial verbal cues for UE positioning directly under shoulder. Exercise facilitated wt bearing for inhibition of tone and facilitation of muscle activation w/ pt requiring tactile cues for consistent equal activation  Full-range trunk rotation in quadruped w/ opening trunk to reach in horizontal abduction before reaching to contralateral side through arm/leg w/ head following movement to facilitate improved GM control and coordination as well as proprioceptive input and kinesthetic sense. Pt able to complete activity w/ SPV for safety after initial cues for positioning and sequencing of exercise  In quadruped, reaching RUE w/ LLE simultaneously to then bring elbow and knee together w/ trunk flexion before alternating sides. Pt able to complete activity w/ min verbal cues for sequencing and close SPV for safety    IADLs Completed simulated laundry activity, including folding clothes to facilitate sequencing during functional tasks, [place] and bilateral coordination. Pt completed activity w/ Mod I and no difficulty, demo'ing good bilateral integration  Simulated cutting food to improve safety w/ task at home due to limitations w/  RUE [place]; OT introduced adaptive cutting board w/ pt able to return demo w/ Mod I    [place] Picking up and threading differently sized beads (round, cylindrical, pony) onto string w/ R hand (stabilizing thread w/ L) to facilitate [place], precision pinch, visual perception, and in-hand manipulation skills         OT Education - 06/25/21 1035     Education Details Updated HEP (handout administered)    Person(s) Educated Patient;Spouse     Methods Explanation;Demonstration;Tactile cues;Verbal cues;Handout    Comprehension Verbalized understanding;Returned demonstration;Verbal cues required             OT Short Term Goals - 06/25/21 0804       OT SHORT TERM GOAL #1   Title Pt will demonstrate independence w/ HEP designed for increased BUE strength and ROM    Time 4    Period Weeks    Status Achieved    Target Date 06/18/21      OT SHORT TERM GOAL #2   Title Pt will improve R forearm supination by at least 4 degrees to improve participation in cooking tasks    Baseline R forearm supination 61* (LUE 71*)    Time 4    Period Weeks    Status Achieved   06/14/21 - 70*     OT SHORT TERM GOAL #3   Title Pt will complete 9-HPT w/ R hand in less than 1 minute to demonstrate improvement in functional FM skills w/ R, dominant UE    Baseline R hand 1 min, 6 sec; L hand 28 sec    Time 4    Period Weeks    Status Achieved   06/14/21 - 51 sec (15 sec improvement)     OT SHORT TERM GOAL #4   Title Pt will improve functional use of RUE as evidenced by completing bilateral integration task (e.g., folding clothes, cutting food) within pt-determined acceptable level of time    Time 4    Period Weeks    Status Achieved             OT Long Term Goals - 06/25/21 1036       OT LONG TERM GOAL #1   Title Pt will be independent w/ full HEP designed for increased coordination and functional use of RUE    Time 8    Period Weeks    Status On-going      OT LONG TERM GOAL #2   Title Pt will complete gardening task at Mod I, using AE/compensatory strategies for safety prn    Time 8    Period Weeks    Status On-going      OT LONG TERM GOAL #3   Title Pt will report completing at least 75% of desired household cleaning activities w/ SPV prn for safety    Time 8    Period Weeks    Status Achieved             Plan - 06/25/21 1037     Clinical Impression Statement Pt is progressing well toward goals, having met all STGs  and 1/3 LTGs at this time; anticipate likely upcoming d/c. Due to pt's compliance and carryover w/ HEP, OT introduced higher level exercises in quadruped positioning to facilitate wt bearing through R side, GM control and coordination, and scapular and core stabilization. Pt completed all exercises w/ close SPV for safety, occasionally requiring verbal cues for sequencing of exercises and positioning/alignment prn, indicating improved stability and strength  of RUE and RLE.    OT Occupational Profile and History Detailed Assessment- Review of Records and additional review of physical, cognitive, psychosocial history related to current functional performance    Occupational performance deficits (Please refer to evaluation for details): ADL's;IADL's;Work;Leisure    Body Structure / Function / Physical Skills ADL;ROM;UE functional use;Balance;Decreased knowledge of use of DME;FMC;Vision;Dexterity;Body mechanics;GMC;Strength;Endurance;Coordination;IADL    Rehab Potential Good    Clinical Decision Making Several treatment options, min-mod task modification necessary    Comorbidities Affecting Occupational Performance: May have comorbidities impacting occupational performance    Modification or Assistance to Complete Evaluation  Min-Moderate modification of tasks or assist with assess necessary to complete eval    OT Frequency 1x / week   Decreased frequency due to insurance coverage   OT Duration 8 weeks    OT Treatment/Interventions Self-care/ADL training;Electrical Stimulation;Therapeutic exercise;Visual/perceptual remediation/compensation;Patient/family education;Splinting;Neuromuscular education;Moist Heat;Energy conservation;Therapist, nutritional;Therapeutic activities;Balance training;Passive range of motion;Manual Therapy;DME and/or AE instruction;Ultrasound;Cryotherapy    Plan Review full HEP and include add'l exercises if needed; assess remaining LTG (gardening task); d/c?    Consulted and  Agree with Plan of Care Patient;Family member/caregiver    Family Member Consulted Husband (Ajay)             Patient will benefit from skilled therapeutic intervention in order to improve the following deficits and impairments:   Body Structure / Function / Physical Skills: ADL, ROM, UE functional use, Balance, Decreased knowledge of use of DME, FMC, Vision, Dexterity, Body mechanics, GMC, Strength, Endurance, Coordination, IADL   Visit Diagnosis: Other lack of coordination  Muscle weakness (generalized)  Unsteadiness on feet  Other symptoms and signs involving the nervous system    Problem List Patient Active Problem List   Diagnosis Date Noted   Iron deficiency anemia 06/10/2021   TIA (transient ischemic attack) 05/09/2021   Chronic diastolic heart failure (grade 1 DD) 05/09/2021   Primary hypertension    Obstructive hydrocephalus (HCC)    Tachycardia    Acute cerebral infarction (HCC)    Visual disturbance as complication of stroke    Transaminitis    Embolic stroke involving right cerebellar artery ([place]) 02/26/2021   Acute blood loss anemia    History of CVA (cerebrovascular accident)    Benign essential HTN    Prediabetes    Hyponatremia    Acute CVA (cerebrovascular accident) ([place]) 02/23/2021   Hyperlipidemia 06/01/2020   Cryptogenic stroke ([place]) 02/25/2020   Essential hypertension 02/05/2020   Posterior circulation stroke ([place]) 01/30/2020   Cerebellar stroke, acute ([place]) 01/30/2020   Musculoskeletal chest pain 09/10/2014   Hand pain 02/12/2014   Fracture of left ulna 11/05/2013   E. coli UTI 11/01/2013   Anemia due to blood loss, acute 10/24/2013   Pelvic fracture ([place]) 10/23/2013   Hyperglycemia 09/18/2013   Routine general medical examination at a health care facility 09/18/2013   Elevated alkaline phosphatase level 09/18/2013   Hot flashes, menopausal 04/10/2013   Pain in joint, upper arm 04/10/2013     Kathrine Cords, OTR/L, MSOT  06/25/2021,  10:44 AM  [facility]. [place], [place], [address] Phone: [phone]   Fax:  [phone]  Name: Glendene Wyer MRN: [id number] Date of Birth: 06/24/1961

## 2021-06-28 ENCOUNTER — Other Ambulatory Visit: Payer: No Typology Code available for payment source

## 2021-06-28 ENCOUNTER — Encounter: Payer: Self-pay | Admitting: Family Medicine

## 2021-06-28 ENCOUNTER — Ambulatory Visit (INDEPENDENT_AMBULATORY_CARE_PROVIDER_SITE_OTHER): Payer: No Typology Code available for payment source | Admitting: Family Medicine

## 2021-06-28 ENCOUNTER — Other Ambulatory Visit: Payer: Self-pay

## 2021-06-28 VITALS — BP 118/80 | HR 97 | Temp 98.5°F | Resp 18 | Ht 62.0 in | Wt 123.0 lb

## 2021-06-28 DIAGNOSIS — Z23 Encounter for immunization: Secondary | ICD-10-CM

## 2021-06-28 DIAGNOSIS — I10 Essential (primary) hypertension: Secondary | ICD-10-CM

## 2021-06-28 DIAGNOSIS — R748 Abnormal levels of other serum enzymes: Secondary | ICD-10-CM | POA: Diagnosis not present

## 2021-06-28 LAB — COMPREHENSIVE METABOLIC PANEL
ALT: 33 U/L (ref 0–35)
AST: 31 U/L (ref 0–37)
Albumin: 3.8 g/dL (ref 3.5–5.2)
Alkaline Phosphatase: 85 U/L (ref 39–117)
BUN: 16 mg/dL (ref 6–23)
CO2: 27 mEq/L (ref 19–32)
Calcium: 9.1 mg/dL (ref 8.4–10.5)
Chloride: 105 mEq/L (ref 96–112)
Creatinine, Ser: 0.66 mg/dL (ref 0.40–1.20)
GFR: 95.74 mL/min (ref >60.00)
Glucose, Bld: 108 mg/dL — ABNORMAL HIGH (ref 70–99)
Potassium: 4.1 mEq/L (ref 3.5–5.1)
Sodium: 139 mEq/L (ref 135–145)
Total Bilirubin: 1 mg/dL (ref 0.2–1.2)
Total Protein: 7.2 g/dL (ref 6.0–8.3)

## 2021-06-28 NOTE — Assessment & Plan Note (Signed)
Well controlled, no changes to meds. Encouraged heart healthy diet such as the DASH diet and exercise as tolerated.  °

## 2021-06-28 NOTE — Progress Notes (Signed)
Established Patient Office Visit  Subjective:  Patient ID: Judy Gutierrez, female    DOB: 1961-07-14  Age: 60 y.o. MRN: [id number]  CC:  Chief Complaint  Patient presents with   Elevated Liver Enzymes    Follow-up    HPI Judy Gutierrez presents for f/u bp and recheck liver function.   No other complaints.   Pt husband is with her.    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)    Stroke [facility])    Unspecified essential hypertension     Past Surgical History:  Procedure Laterality Date   BUBBLE STUDY  03/05/2020   Procedure: BUBBLE STUDY;  Surgeon: [name], MD;  Location: [facility] ENDOSCOPY;  Service: Cardiovascular;;   CESAREAN SECTION  1992   IR ANGIO INTRA EXTRACRAN SEL COM CAROTID INNOMINATE BILAT MOD SED  05/11/2021   IR ANGIO VERTEBRAL SEL SUBCLAVIAN INNOMINATE BILAT MOD SED  05/11/2021   LOOP RECORDER INSERTION N/A 03/05/2020   Procedure: LOOP RECORDER INSERTION;  Surgeon: [name], MD;  Location: [facility] INVASIVE CV LAB;  Service: Cardiovascular;  Laterality: N/A;   TEE WITHOUT CARDIOVERSION N/A 03/05/2020   Procedure: TRANSESOPHAGEAL ECHOCARDIOGRAM (TEE);  Surgeon: [name], MD;  Location: [facility] ENDOSCOPY;  Service: Cardiovascular;  Laterality: N/A;    Family History  Problem Relation Age of Onset   Pulmonary embolism Mother    Hepatitis Father    Diabetes Father    Diabetes Mellitus II Sister    Breast cancer Neg Hx     Social History   Socioeconomic History   Marital status: Married    Spouse name: Not on file   Number of children: 1   Years of education: Not on file   Highest education level: Not on file  Occupational History   Occupation: convenience store    Comment: self employed  Tobacco Use   Smoking status: Never   Smokeless tobacco: Never  Substance and Sexual Activity   Alcohol  use: No   Drug use: No   Sexual activity: Yes  Other Topics Concern   Not on file  Social History Narrative   Lives with husband   Right Handed   Drinks caffeine seldom   Social Determinants of Health   Financial Resource Strain: Not on file  Food Insecurity: Not on file  Transportation Needs: Not on file  Physical Activity: Not on file  Stress: Not on file  Social Connections: Not on file  Intimate Partner Violence: Not on file    Outpatient Medications Prior to Visit  Medication Sig Dispense Refill   amLODipine (NORVASC) 5 MG tablet Take 1 tablet (5 mg total) by mouth daily. 90 tablet 3   aspirin 81 MG EC tablet Take 1 tablet (81 mg total) by mouth daily. 90 tablet 1   atorvastatin (LIPITOR) 40 MG tablet Take 2 tablets (80 mg total) by mouth every evening.     Calcium Carbonate-Vit D-Min (CALCIUM 1200 PO) Take 1 tablet by mouth daily.     clopidogrel (PLAVIX) 75 MG tablet Take 1 tablet (75 mg total) by mouth daily. 30 tablet 2   Ferrous Sulfate (IRON PO) Take 1 tablet by mouth daily.     Multiple Vitamin (MULTIVITAMIN WITH MINERALS) TABS tablet Take 1 tablet by mouth daily.     acetaminophen (TYLENOL) 325 MG tablet Take 325  mg by mouth every 6 (six) hours as needed for mild pain or headache. (Patient not taking: Reported on 06/28/2021)     No facility-administered medications prior to visit.    No Known Allergies  ROS Review of Systems  Constitutional:  Negative for appetite change, diaphoresis, fatigue and unexpected weight change.  Eyes:  Negative for pain, redness and visual disturbance.  Respiratory:  Negative for cough, chest tightness, shortness of breath and wheezing.   Cardiovascular:  Negative for chest pain, palpitations and leg swelling.  Endocrine: Negative for cold intolerance, heat intolerance, polydipsia, polyphagia and polyuria.  Genitourinary:  Negative for difficulty urinating, dysuria and frequency.  Neurological:  Negative for dizziness,  light-headedness, numbness and headaches.     Objective:    Physical Exam Vitals and nursing note reviewed.  Constitutional:      Appearance: She is well-developed.  HENT:     Head: Normocephalic and atraumatic.  Eyes:     Conjunctiva/sclera: Conjunctivae normal.  Neck:     Thyroid: No thyromegaly.     Vascular: No carotid bruit or JVD.  Cardiovascular:     Rate and Rhythm: Normal rate and regular rhythm.     Heart sounds: Normal heart sounds. No murmur heard. Pulmonary:     Effort: Pulmonary effort is normal. No respiratory distress.     Breath sounds: Normal breath sounds. No wheezing or rales.  Chest:     Chest wall: No tenderness.  Musculoskeletal:     Cervical back: Normal range of motion and neck supple.  Neurological:     Mental Status: She is alert and oriented to person, place, and time.    BP 118/80 (BP Location: Left Arm, Patient Position: Sitting, Cuff Size: Normal)   Pulse 97   Temp 98.5 F (36.9 C) (Oral)   Resp 18   Ht 5\' 2"  (1.575 m)   Wt 123 lb (55.8 kg)   LMP 12/08/2010   SpO2 98%   BMI 22.50 kg/m  Wt Readings from Last 3 Encounters:  06/28/21 123 lb (55.8 kg)  06/10/21 122 lb 3.2 oz (55.4 kg)  05/09/21 126 lb 15.8 oz (57.6 kg)     Health Maintenance Due  Topic Date Due   Zoster Vaccines- Shingrix (2 of 2) 09/21/2018   Fecal DNA (Cologuard)  10/04/2018   COVID-19 Vaccine (4 - Booster for Pfizer series) 12/24/2020    There are no preventive care reminders to display for this patient.  Lab Results  Component Value Date   TSH 3.37 12/04/2020   Lab Results  Component Value Date   WBC 5.2 06/10/2021   HGB 12.3 06/10/2021   HCT 36.3 06/10/2021   MCV 90.6 06/10/2021   PLT 234.0 06/10/2021   Lab Results  Component Value Date   NA 139 06/10/2021   K 4.8 06/10/2021   CO2 30 06/10/2021   GLUCOSE 113 (H) 06/10/2021   BUN 17 06/10/2021   CREATININE 0.74 06/10/2021   BILITOT 1.3 (H) 06/10/2021   ALKPHOS 79 06/10/2021   AST 50 (H)  06/10/2021   ALT 56 (H) 06/10/2021   PROT 7.5 06/10/2021   ALBUMIN 4.0 06/10/2021   CALCIUM 9.7 06/10/2021   ANIONGAP 7 05/09/2021   GFR 88.33 06/10/2021   Lab Results  Component Value Date   CHOL 87 05/10/2021   Lab Results  Component Value Date   HDL 43 05/10/2021   Lab Results  Component Value Date   LDLCALC 41 05/10/2021   Lab Results  Component Value Date  TRIG 16 05/10/2021   Lab Results  Component Value Date   CHOLHDL 2.0 05/10/2021   Lab Results  Component Value Date   HGBA1C 5.7 06/10/2021      Assessment & Plan:   Problem List Items Addressed This Visit       Unprioritized   Elevated liver enzymes    Recheck today       Primary hypertension    Well controlled, no changes to meds. Encouraged heart healthy diet such as the DASH diet and exercise as tolerated.       Other Visit Diagnoses     Need for influenza vaccination    -  Primary   Relevant Orders   Flu Vaccine QUAD 24mo+IM (Fluarix, Fluzone & Alfiuria Quad PF) (Completed)       No orders of the defined types were placed in this encounter.   Follow-up: No follow-ups on file.    [name], DO

## 2021-06-28 NOTE — Patient Instructions (Signed)
Liver Function Tests Why am I having this test? Liver function tests are done to see how well your liver is working. The proteins and enzymes measured in the tests can alert your health care provider to inflammation, damage, or disease in your liver. It is common to have liver function tests: When you are taking certain medicines. If you have liver disease. If you drink a lot of alcohol. During annual physical exams. When you have other conditions that may affect your liver. If you have symptoms such as yellowing of the skin (jaundice), abdominal pain, or nausea and vomiting. What is being tested? These tests measure various substances in your blood. This may include: Alanine aminotransferase (ALT). This is an enzyme in the liver. Aspartate aminotransferase (AST). This is an enzyme in the liver, heart, and muscles. Alkaline phosphatase (ALP). This is a protein in the liver, bile ducts, bone, and other body tissues. Total bilirubin. This is a yellow pigment in bile. Albumin. This is a protein in the liver. Prothrombin time and international normalized ratio (PT and INR). PT measures the time it takes for your blood to clot. INR is a calculation of blood clotting time based on your PT result. Total protein. This includes two proteins, albumin and globulin, found in the blood. What kind of sample is taken? A blood sample is required for this test. It is usually collected by inserting a needle into a blood vessel. How do I prepare for this test? How you prepare will depend on which tests are being done and the reason for doing them. You may need to: Avoid eating for 4-6 hours before the test, or as told by your health care provider. Follow instructions from your health care provider about eating or drinking restrictions before the tests. Stop taking certain medicines before your blood test, as told by your health care provider. Tell a health care provider about: All medicines you are taking,  including vitamins, herbs, eye drops, creams, and over-the-counter medicines. Any medical conditions you have. Whether you are pregnant or may be pregnant. How are the results reported? Your test results will be reported as values. Your health care provider will compare your results to normal ranges that were established after testing a large group of people (reference ranges). Reference ranges may vary among labs and hospitals. For the substances measured in liver function tests, common reference ranges are: ALT Infant: 10-40 international units/L. Child or adult: 4-36 international units/L at 37C or 4-36 units/L (SI units). Reference ranges may be higher for older adults. AST Newborn 0-5 days old: 35-140 units/L. Child younger than 3 years old: 15-60 units/L. 3-6 years old: 15-50 units/L. 6-12 years old: 10-50 units/L. 12-18 years old: 10-40 units/L. Adult: 0-35 units/L or 0-0.58 microkatals/L (SI units). Reference ranges may be higher for older adults. ALP Child younger than 2 years old: 85-235 units/L. 2-8 years old: 65-210 units/L. 9-15 years old: 60-300 units/L. 16-21 years old: 30-200 units/L. Adult: 30-120 units/L or 0.5-2.0 microkatals/L (SI units). Reference ranges may be higher for older adults. Total bilirubin Newborn: 1.0-12.0 mg/dL or 17.1-205 micromoles/L (SI units). Child or adult: 0.3-1.0 mg/dL or 5.1-17 micromoles/L. Albumin Premature infant: 3.0-4.2 g/dL. Newborn: 3.5-5.4 g/dL. Infant: 4.4-5.4 g/dL. Child: 4.0-5.9 g/dL. Adult: 3.5-5.0 g/dL or 35-50 g/L (SI units). PT 11.0-12.5 seconds; 85%-100%. INR 0.8-1.1. Total protein Premature infant: 4.2-7.6 g/dL. Newborn: 4.6-7.4 g/dL. Infant: 6.0-6.7 g/dL. Child: 6.2-8.0 g/dL. Adult: 6.4-8.3 g/dL or 64-83 g/L (SI units). What do the results mean? Results that are within the   reference ranges are considered normal. For each substance measured, results outside the reference range can indicate various health  issues. ALT Levels above the normal range may indicate liver disease. Sometimes levels also increase after burns, surgery, heart attack, muscle damage, or seizure. AST Levels above the normal range may indicate liver disease, skeletal muscle diseases, or other diseases that destroy the red blood cells or tissues of the pancreas. Levels below the normal range may indicate acute kidney disease, pregnancy, or diabetic ketoacidosis. ALP Levels above the normal range may be seen in biliary obstruction, liver diseases, bone disease, thyroid disease, lymphoma, or several other conditions. People with blood type O or B may show higher levels after a fatty meal. Levels below the normal range may indicate bone and teeth conditions, malnutrition, protein deficiency, or Wilson's disease. Total bilirubin Levels above the normal range may indicate problems with the liver, gallbladder, or bile ducts. Albumin Levels above the normal range may indicate dehydration. They may also be caused by a diet that is high in protein. Levels below the normal range may indicate kidney disease, liver disease, or malabsorption of nutrients. PT and INR Levels above the normal range mean that your blood is clotting slower than normal. This may be due to blood disorders, liver disorders, certain medicines like warfarin, or low levels of vitamin K. Total protein Levels above the normal range may be due to infection or other diseases. Levels below the normal range may be due to an immune system disorder, bleeding, burns, kidney disorder, liver disease, trouble absorbing or getting nutrients, or other conditions that affect the intestines. Talk with your health care provider about what your results mean. Questions to ask your health care provider Ask your health care provider, or the department that is doing the test: When will my results be ready? How will I get my results? What are my treatment options? What other tests do I  need? What are my next steps? Summary Liver function tests are done to see how well your liver is working. The results can alert your health care provider to inflammation, damage, or disease in your liver. You may need to avoid eating for 4-6 hours before the test or stop taking certain medicines before your blood test, as told by your health care provider. Talk with your health care provider about what your results mean. This information is not intended to replace advice given to you by your health care provider. Make sure you discuss any questions you have with your health care provider. Document Revised: 07/07/2020 Document Reviewed: 07/07/2020 Elsevier Patient Education  2022 Elsevier Inc.  

## 2021-06-28 NOTE — Assessment & Plan Note (Signed)
Recheck today. 

## 2021-07-01 ENCOUNTER — Encounter: Payer: Self-pay | Admitting: Physical Therapy

## 2021-07-01 ENCOUNTER — Other Ambulatory Visit: Payer: Self-pay

## 2021-07-01 ENCOUNTER — Ambulatory Visit: Payer: No Typology Code available for payment source | Admitting: Physical Therapy

## 2021-07-01 ENCOUNTER — Encounter: Payer: Self-pay | Admitting: Occupational Therapy

## 2021-07-01 ENCOUNTER — Ambulatory Visit: Payer: No Typology Code available for payment source | Admitting: Occupational Therapy

## 2021-07-01 DIAGNOSIS — M6281 Muscle weakness (generalized): Secondary | ICD-10-CM

## 2021-07-01 DIAGNOSIS — R2681 Unsteadiness on feet: Secondary | ICD-10-CM

## 2021-07-01 DIAGNOSIS — R278 Other lack of coordination: Secondary | ICD-10-CM

## 2021-07-01 DIAGNOSIS — R29818 Other symptoms and signs involving the nervous system: Secondary | ICD-10-CM

## 2021-07-01 DIAGNOSIS — R260 Ataxic gait: Secondary | ICD-10-CM

## 2021-07-01 NOTE — Therapy (Signed)
[facility] [address] [place], [place], [address] Phone: [phone]   Fax:  [phone]  Physical Therapy Treatment  Patient Details  Name: Judy Gutierrez MRN: [id number] Date of Birth: [birth year] Referring Provider (PT): Kirsteins   Encounter Date: 07/01/2021   PT End of Session - 07/01/21 0844     Visit Number 12    Date for PT Re-Evaluation 08/05/21    PT Start Time 0802    PT Stop Time 0844    PT Time Calculation (min) 42 min    Equipment Utilized During Treatment Gait belt    Activity Tolerance Patient tolerated treatment well    Behavior During Therapy WFL for tasks assessed/performed             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)    Stroke (HCC)    Unspecified essential hypertension     Past Surgical History:  Procedure Laterality Date   BUBBLE STUDY  03/05/2020   Procedure: BUBBLE STUDY;  Surgeon: [name], MD;  Location: [facility] ENDOSCOPY;  Service: Cardiovascular;;   CESAREAN SECTION  1992   IR ANGIO INTRA EXTRACRAN SEL COM CAROTID INNOMINATE BILAT MOD SED  05/11/2021   IR ANGIO VERTEBRAL SEL SUBCLAVIAN INNOMINATE BILAT MOD SED  05/11/2021   LOOP RECORDER INSERTION N/A 03/05/2020   Procedure: LOOP RECORDER INSERTION;  Surgeon: [name], MD;  Location: [facility] INVASIVE CV LAB;  Service: Cardiovascular;  Laterality: N/A;   TEE WITHOUT CARDIOVERSION N/A 03/05/2020   Procedure: TRANSESOPHAGEAL ECHOCARDIOGRAM (TEE);  Surgeon: [name], MD;  Location: [facility] ENDOSCOPY;  Service: Cardiovascular;  Laterality: N/A;    There were no vitals filed for this visit.   Subjective Assessment - 07/01/21 0803     Subjective Doing HEP 2-3x a day and they are a good challege.    Patient Stated Goals improve balance, strength.    Currently in Pain?  No/denies                               [facility] Adult PT Treatment/Exercise - 07/01/21 0001       Neuro Re-ed    Neuro Re-ed Details  rockerboard balance ant/pos and M/L 2x30" no UE support; ant/pos and M/L wt shift 2x10; L foot step into 6" box 2x10; 1/2 kneeling D2 flexion with red TB 10x each   more challenge anterior and R     Lumbar Exercises: Aerobic   Recumbent Bike L2.5 x 6 min      Knee/Hip Exercises: Standing   Other Standing Knee Exercises sidestepping, forward and backward monster walks with red TB at ankles with CGA 2x75ft each   cues to maintain upright chest and increase step length     Knee/Hip Exercises: Seated   Sit to Sand 1 set;5 reps;without UE support   + VOR cancellation                      PT Short Term Goals - 05/17/21 1751       PT SHORT TERM GOAL #1   Title Independent with initial HEP with family SBA for safety    Time 2    Period Weeks    Status Achieved    Target Date 05/27/21  PT Long Term Goals - 06/22/21 0926       PT LONG TERM GOAL #1   Title Independent with advanced HEP    Status On-going      PT LONG TERM GOAL #2   Title Berg improved to at least 48/56 to demonstrate decreased falls risk    Status On-going      PT LONG TERM GOAL #3   Title TUG improved to </= 10 seconds with SPC or independent, no LOB to demonstrate improved functional gait speed and decreased falls risk    Status On-going      PT LONG TERM GOAL #4   Title Improved BUE and BLE strength to at least 4+/5    Status On-going                   Plan - 07/01/21 0845     Clinical Impression Statement Patient arrived to session without new complaints. Worked on static and dynamic balance on rockerboard- patient demonstrated more challenge to anterior and R directions. Cueing for decrease audible foot tap required with stepping into box activity to improved R LE stability and strength. Worked on resisted  walking in multiple directions with patient requiring cues to maintain upright chest and increase step length.  kneeling exercises were trialed with good trunk stability demonstrated. No complaints at end of session. Patient is progressing towards goals.    PT Treatment/Interventions ADLs/Self Care Home Management;Cryotherapy;Electrical Stimulation;Iontophoresis 4mg /ml Dexamethasone;Moist Heat;Functional mobility training;Therapeutic activities;Therapeutic exercise;Balance training;Neuromuscular re-education;Patient/family education;Manual techniques;Energy conservation;Vestibular;Canalith Repostioning;Gait training;Stair training    PT Next Visit Plan BUE/LE and core strengthening, gait/balance stability. Manual/modalities as needed to facilitate progress.    Consulted and Agree with Plan of Care Patient    Family Member Consulted husband             Patient will benefit from skilled therapeutic intervention in order to improve the following deficits and impairments:  Abnormal gait, Decreased range of motion, Difficulty walking, Impaired UE functional use, Increased muscle spasms, Decreased endurance, Pain, Improper body mechanics, Decreased balance, Decreased strength, Decreased mobility  Visit Diagnosis: Muscle weakness (generalized)  Unsteadiness on feet  Other symptoms and signs involving the nervous system  Ataxic gait     Problem List Patient Active Problem List   Diagnosis Date Noted   Elevated liver enzymes 06/28/2021   Iron deficiency anemia 06/10/2021   TIA (transient ischemic attack) 05/09/2021   Chronic diastolic heart failure (grade 1 DD) 05/09/2021   Primary hypertension    Obstructive hydrocephalus (HCC)    Tachycardia    Acute cerebral infarction (HCC)    Visual disturbance as complication of stroke    Transaminitis    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 coli UTI 11/01/2013   Anemia due to blood loss, acute 10/24/2013   Pelvic fracture (HCC) 10/23/2013   Hyperglycemia 09/18/2013   Routine general medical examination at a health care facility 09/18/2013   Elevated alkaline phosphatase level 09/18/2013   Hot  flashes, menopausal 04/10/2013   Pain in joint, upper arm 04/10/2013     Anette Guarneri, PT, DPT 07/01/21 8:47 AM    [facility] [address] [place], [place], [address] Phone: [phone]   Fax:  [phone]  Name: Judy Gutierrez MRN: [id number] Date of Birth: May 03, 1961

## 2021-07-01 NOTE — Progress Notes (Signed)
Carelink Summary Report / Loop Recorder 

## 2021-07-01 NOTE — Therapy (Signed)
[facility] [address] [place], [place], [address] Phone: [phone]   Fax:  [phone]  Occupational Therapy Treatment  Patient Details  Name: Judy Gutierrez MRN: [id number] Date of Birth: 04-05-1961 Referring Provider (OT): Claudette Laws, MD  Encounter Date: 07/01/2021   OT End of Session - 07/01/21 0846     Visit Number 6    Number of Visits 9    Date for OT Re-Evaluation 08/19/21    Authorization Type [employer] plan does not cover OT/ST)    OT Start Time [phone]    OT Stop Time 0926    OT Time Calculation (min) 42 min    Activity Tolerance Patient tolerated treatment well    Behavior During Therapy WFL for tasks assessed/performed            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)    Stroke (HCC)    Unspecified essential hypertension     Past Surgical History:  Procedure Laterality Date   BUBBLE STUDY  03/05/2020   Procedure: BUBBLE STUDY;  Surgeon: [name], MD;  Location: [facility] ENDOSCOPY;  Service: Cardiovascular;;   CESAREAN SECTION  1992   IR ANGIO INTRA EXTRACRAN SEL COM CAROTID INNOMINATE BILAT MOD SED  05/11/2021   IR ANGIO VERTEBRAL SEL SUBCLAVIAN INNOMINATE BILAT MOD SED  05/11/2021   LOOP RECORDER INSERTION N/A 03/05/2020   Procedure: LOOP RECORDER INSERTION;  Surgeon: [name], MD;  Location: [facility] INVASIVE CV LAB;  Service: Cardiovascular;  Laterality: N/A;   TEE WITHOUT CARDIOVERSION N/A 03/05/2020   Procedure: TRANSESOPHAGEAL ECHOCARDIOGRAM (TEE);  Surgeon: [name], MD;  Location: [facility] ENDOSCOPY;  Service: Cardiovascular;  Laterality: N/A;    There were no vitals filed for this visit.   Subjective Assessment - 07/01/21 0845     Subjective  "It was difficult, but felt good"    Patient is accompanied  by: Family member   Ajay (husband)   Pertinent History R cerebellar CVA 02/22/21, TIA 05/09/21, and hx of cerebellar CVA 01/29/20; HTN, HLD    Patient Stated Goals Drive and return to cleaning/cooking    Currently in Pain? No/denies             Treatment/Exercises - 07/01/21    Towel Slides  Resistance towel slides (5#) on elevated tabletop w/ BUE completed while standing to facilitate increased ROM and stretch w/ light weight bearing/increased resistance for proprioceptive and sensory input; OT provided min verbal cues to decrease compensatory L shoulder elevation    Overhead Reach Placing and removing resistance clothespins (1-8#) onto target located on overhead shelf w/ RUE to facilitate functional reach in standing, increased GMC/coordination, activity tolerance, and force gradation. OT upgraded activity w/ pt holding and moving target w/ LUE while placing clothespins w/ RUE to incorporate bilateral integration w/ pt able to complete activity w/ 1 rest break in standing.    Pegboard Activity  Placing easy-grip pegs into pegboard to facilitate [facility], hand strengthening, and eye-hand coordination; pt able to complete activity w/ no drops    Quadruped Picking up marbles 1 at a time to place on easy-grip pegs while in quadruped over small therapy ball to facilitate in-hand manipulation, FMC, and intrinsic hand strengthening w/ additional core activation and proprioceptive and sensorimotor input through BUEs. Pt completed 2nd trial retrieving marbles  from pegs and positioning back on pegboard, incorporating isolated index finger control to manipulate pegs in small spaces/close together. Completed w/ multiple drops; increased success with decreased speed.  Reviewed trunk rotation exercise in quadruped position w/ horizontally abducting UE w/ trunk rotation and then reaching between UE and LE on contralateral side; completed 10x alternating sides w/ CGA         OT Short Term Goals - 06/25/21 0804        OT SHORT TERM GOAL #1   Title Pt will demonstrate independence w/ HEP designed for increased BUE strength and ROM    Time 4    Period Weeks    Status Achieved    Target Date 06/18/21      OT SHORT TERM GOAL #2   Title Pt will improve R forearm supination by at least 4 degrees to improve participation in cooking tasks    Baseline R forearm supination 61* (LUE 71*)    Time 4    Period Weeks    Status Achieved   06/14/21 - 70*     OT SHORT TERM GOAL #3   Title Pt will complete 9-HPT w/ R hand in less than 1 minute to demonstrate improvement in functional FM skills w/ R, dominant UE    Baseline R hand 1 min, 6 sec; L hand 28 sec    Time 4    Period Weeks    Status Achieved   06/14/21 - 51 sec (15 sec improvement)     OT SHORT TERM GOAL #4   Title Pt will improve functional use of RUE as evidenced by completing bilateral integration task (e.g., folding clothes, cutting food) within pt-determined acceptable level of time    Time 4    Period Weeks    Status Achieved             OT Long Term Goals - 06/25/21 1036       OT LONG TERM GOAL #1   Title Pt will be independent w/ full HEP designed for increased coordination and functional use of RUE    Time 8    Period Weeks    Status On-going      OT LONG TERM GOAL #2   Title Pt will complete gardening task at Mod I, using AE/compensatory strategies for safety prn    Time 8    Period Weeks    Status On-going      OT LONG TERM GOAL #3   Title Pt will report completing at least 75% of desired household cleaning activities w/ SPV prn for safety    Time 8    Period Weeks    Status Achieved             Plan - 07/01/21 0847     Clinical Impression Statement OT facilitated resistive towel slides in preparation for functional overhead reach activity due to RUE ataxia w/ likely benefit of increased proprioceptive input and sensory input improving success w/ subsequent therapeutic activities. Also, due to success w/  quadruped exercises in previous session, OT graded exercises up this session to include visual perceptual and FM skills w/ pt demonstrating improved control and coordination while maintaining positioning in quadruped.    OT Occupational Profile and History Detailed Assessment- Review of Records and additional review of physical, cognitive, psychosocial history related to current functional performance    Occupational performance deficits (Please refer to evaluation for details): ADL's;IADL's;Work;Leisure    Body Structure / Function / Physical Skills ADL;ROM;UE  functional use;Balance;Decreased knowledge of use of DME;FMC;Vision;Dexterity;Body mechanics;GMC;Strength;Endurance;Coordination;IADL    Rehab Potential Good    Clinical Decision Making Several treatment options, min-mod task modification necessary    Comorbidities Affecting Occupational Performance: May have comorbidities impacting occupational performance    Modification or Assistance to Complete Evaluation  Min-Moderate modification of tasks or assist with assess necessary to complete eval    OT Frequency 1x / week   Decreased frequency due to insurance coverage   OT Duration 8 weeks    OT Treatment/Interventions Self-care/ADL training;Electrical Stimulation;Therapeutic exercise;Visual/perceptual remediation/compensation;Patient/family education;Splinting;Neuromuscular education;Moist Heat;Energy conservation;Building services engineer;Therapeutic activities;Balance training;Passive range of motion;Manual Therapy;DME and/or AE instruction;Ultrasound;Cryotherapy    Plan Review full HEP and include add'l exercises if needed; continue to address wt bearing and GMC    Consulted and Agree with Plan of Care Patient;Family member/caregiver    Family Member Consulted Husband (Ajay)            Patient will benefit from skilled therapeutic intervention in order to improve the following deficits and impairments:   Body Structure / Function /  Physical Skills: ADL, ROM, UE functional use, Balance, Decreased knowledge of use of DME, FMC, Vision, Dexterity, Body mechanics, GMC, Strength, Endurance, Coordination, IADL   Visit Diagnosis: Other lack of coordination  Muscle weakness (generalized)  Unsteadiness on feet  Other symptoms and signs involving the nervous system  Problem List Patient Active Problem List   Diagnosis Date Noted   Elevated liver enzymes 06/28/2021   Iron deficiency anemia 06/10/2021   TIA (transient ischemic attack) 05/09/2021   Chronic diastolic heart failure (grade 1 DD) 05/09/2021   Primary hypertension    Obstructive hydrocephalus (HCC)    Tachycardia    Acute cerebral infarction (HCC)    Visual disturbance as complication of stroke    Transaminitis    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 coli UTI 11/01/2013   Anemia due to blood loss, acute 10/24/2013   Pelvic fracture (HCC) 10/23/2013   Hyperglycemia 09/18/2013   Routine general medical examination at a health care facility 09/18/2013   Elevated alkaline phosphatase level 09/18/2013   Hot flashes, menopausal 04/10/2013   Pain in joint, upper arm 04/10/2013    Rosie Fate, OTR/L, MSOT  07/01/2021, 8:53 AM  [facility] [address] [place], [place], [address] Phone: [phone]   Fax:  [phone]  Name: Elenie Coven MRN: [id number] Date of Birth: 02-28-1961

## 2021-07-02 ENCOUNTER — Encounter: Payer: Self-pay | Admitting: Physical Therapy

## 2021-07-02 ENCOUNTER — Ambulatory Visit: Payer: No Typology Code available for payment source | Admitting: Physical Therapy

## 2021-07-02 DIAGNOSIS — R260 Ataxic gait: Secondary | ICD-10-CM

## 2021-07-02 DIAGNOSIS — R29818 Other symptoms and signs involving the nervous system: Secondary | ICD-10-CM

## 2021-07-02 DIAGNOSIS — M6281 Muscle weakness (generalized): Secondary | ICD-10-CM

## 2021-07-02 DIAGNOSIS — R2681 Unsteadiness on feet: Secondary | ICD-10-CM

## 2021-07-02 NOTE — Therapy (Signed)
[facility] [address] [place], [place], [address] Phone: [phone]   Fax:  [phone]  Physical Therapy Treatment  Patient Details  Name: Judy Gutierrez MRN: [id number] Date of Birth: 1961-04-25 Referring Provider (PT): Kirsteins   Encounter Date: 07/02/2021   PT End of Session - 07/02/21 1204     Visit Number 13    Date for PT Re-Evaluation 08/05/21    PT Start Time 0846    PT Stop Time 0928    PT Time Calculation (min) 42 min    Equipment Utilized During Treatment Gait belt    Activity Tolerance Patient tolerated treatment well    Behavior During Therapy WFL for tasks assessed/performed             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)    Stroke (HCC)    Unspecified essential hypertension     Past Surgical History:  Procedure Laterality Date   BUBBLE STUDY  03/05/2020   Procedure: BUBBLE STUDY;  Surgeon: [name], MD;  Location: [facility] ENDOSCOPY;  Service: Cardiovascular;;   CESAREAN SECTION  1992   IR ANGIO INTRA EXTRACRAN SEL COM CAROTID INNOMINATE BILAT MOD SED  05/11/2021   IR ANGIO VERTEBRAL SEL SUBCLAVIAN INNOMINATE BILAT MOD SED  05/11/2021   LOOP RECORDER INSERTION N/A 03/05/2020   Procedure: LOOP RECORDER INSERTION;  Surgeon: [name], MD;  Location: [facility] INVASIVE CV LAB;  Service: Cardiovascular;  Laterality: N/A;   TEE WITHOUT CARDIOVERSION N/A 03/05/2020   Procedure: TRANSESOPHAGEAL ECHOCARDIOGRAM (TEE);  Surgeon: [name], MD;  Location: [facility] ENDOSCOPY;  Service: Cardiovascular;  Laterality: N/A;    There were no vitals filed for this visit.   Subjective Assessment - 07/02/21 0848     Subjective Reports that yesterday after sitting up from bed she noticed dizziness for 1-2 seconds. Denies any new red flad symptoms.     Patient is accompained by: Family member    Patient Stated Goals improve balance, strength.    Currently in Pain? No/denies                     Vestibular Assessment - 07/02/21 0001       Positional Testing   Sidelying Test Sidelying Right;Sidelying Left      Sidelying Right   Sidelying Right Duration 0    Sidelying Right Symptoms No nystagmus      Sidelying Left   Sidelying Left Duration 0    Sidelying Left Symptoms No nystagmus                      [facility] Adult PT Treatment/Exercise - 07/02/21 0001       Neuro Re-ed    Neuro Re-ed Details  walking forward/back with cable 25# with CGA; tall kneeling cable row 2x10 15#; 1/2 kneel yellow overhead medball lean 10x each;tandem balance and forward/back tandem walk on foam beam x5 min; multidirection cone toe tap 10x each foot without UE support      Lumbar Exercises: Aerobic   Recumbent Bike L2.5 x 6 min                       PT Short Term Goals - 05/17/21 1751       PT SHORT TERM GOAL #1  Title Independent with initial HEP with family SBA for safety    Time 2    Period Weeks    Status Achieved    Target Date 05/27/21               PT Long Term Goals - 06/22/21 0926       PT LONG TERM GOAL #1   Title Independent with advanced HEP    Status On-going      PT LONG TERM GOAL #2   Title Berg improved to at least 48/56 to demonstrate decreased falls risk    Status On-going      PT LONG TERM GOAL #3   Title TUG improved to </= 10 seconds with SPC or independent, no LOB to demonstrate improved functional gait speed and decreased falls risk    Status On-going      PT LONG TERM GOAL #4   Title Improved BUE and BLE strength to at least 4+/5    Status On-going                   Plan - 07/02/21 1204     Clinical Impression Statement Patient arrived to session with report of new episode of 1-2 seconds of dizziness upon sitting up from supine yesterday. Denies any other  red flag symptoms. Educated patient on red flag stroke symptoms and to call 911 if these symptoms present- patient reported understanding. R/L sidelying tests were negative for nystagmus and dizziness. Resisted walking initially demonstrated limited step length and control which improved. Tall and  kneeling activities demonstrated some core instability but patient is able to transition from floor to stand fairly well- mod I. Challenge with backwards walking was demonstrated as patient had trouble with proprioceptive abilities and placemen of foot behind the other. Fairly good SLS demonstrated with activities today. Patient tolerated session well and without complaints at end of session.    PT Treatment/Interventions ADLs/Self Care Home Management;Cryotherapy;Electrical Stimulation;Iontophoresis 4mg /ml Dexamethasone;Moist Heat;Functional mobility training;Therapeutic activities;Therapeutic exercise;Balance training;Neuromuscular re-education;Patient/family education;Manual techniques;Energy conservation;Vestibular;Canalith Repostioning;Gait training;Stair training    PT Next Visit Plan BUE/LE and core strengthening, gait/balance stability. Manual/modalities as needed to facilitate progress.    Consulted and Agree with Plan of Care Patient    Family Member Consulted husband             Patient will benefit from skilled therapeutic intervention in order to improve the following deficits and impairments:  Abnormal gait, Decreased range of motion, Difficulty walking, Impaired UE functional use, Increased muscle spasms, Decreased endurance, Pain, Improper body mechanics, Decreased balance, Decreased strength, Decreased mobility  Visit Diagnosis: Muscle weakness (generalized)  Unsteadiness on feet  Other symptoms and signs involving the nervous system  Ataxic gait     Problem List Patient Active Problem List   Diagnosis Date Noted   Elevated liver enzymes 06/28/2021   Iron deficiency anemia  06/10/2021   TIA (transient ischemic attack) 05/09/2021   Chronic diastolic heart failure (grade 1 DD) 05/09/2021   Primary hypertension    Obstructive hydrocephalus (HCC)    Tachycardia    Acute cerebral infarction [facility])    Visual disturbance as complication of stroke    Transaminitis    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 coli UTI 11/01/2013   Anemia due to blood loss, acute 10/24/2013   Pelvic fracture (HCC) 10/23/2013   Hyperglycemia 09/18/2013   Routine general medical examination at a health care facility 09/18/2013   Elevated alkaline phosphatase level 09/18/2013   Hot flashes, menopausal 04/10/2013   Pain in joint, upper arm 04/10/2013    Anette Guarneri, PT, DPT 07/02/21 12:05 PM   [facility] [place] [address] [place], [place], [address] Phone: [phone]   Fax:  [phone]  Name: Judy Gutierrez MRN: [id number] Date of Birth: May 12, 1961

## 2021-07-05 ENCOUNTER — Encounter: Payer: Self-pay | Admitting: Occupational Therapy

## 2021-07-05 ENCOUNTER — Encounter: Payer: Self-pay | Admitting: Physical Therapy

## 2021-07-05 ENCOUNTER — Other Ambulatory Visit: Payer: Self-pay

## 2021-07-05 ENCOUNTER — Ambulatory Visit: Payer: No Typology Code available for payment source | Admitting: Occupational Therapy

## 2021-07-05 ENCOUNTER — Other Ambulatory Visit: Payer: Self-pay | Admitting: Family Medicine

## 2021-07-05 ENCOUNTER — Ambulatory Visit: Payer: No Typology Code available for payment source | Admitting: Physical Therapy

## 2021-07-05 DIAGNOSIS — R2681 Unsteadiness on feet: Secondary | ICD-10-CM

## 2021-07-05 DIAGNOSIS — M6281 Muscle weakness (generalized): Secondary | ICD-10-CM | POA: Diagnosis not present

## 2021-07-05 DIAGNOSIS — R29818 Other symptoms and signs involving the nervous system: Secondary | ICD-10-CM

## 2021-07-05 DIAGNOSIS — R278 Other lack of coordination: Secondary | ICD-10-CM

## 2021-07-05 DIAGNOSIS — R260 Ataxic gait: Secondary | ICD-10-CM

## 2021-07-05 DIAGNOSIS — Z1231 Encounter for screening mammogram for malignant neoplasm of breast: Secondary | ICD-10-CM

## 2021-07-05 NOTE — Therapy (Signed)
[facility] [address] [place], [place], [address] Phone: [phone]   Fax:  [phone]  Physical Therapy Treatment  Patient Details  Name: Judy Gutierrez MRN: [id number] Date of Birth: [birth year] Referring Provider (PT): Kirsteins   Encounter Date: 07/05/2021   PT End of Session - 07/05/21 0930     Visit Number 14    Date for PT Re-Evaluation 08/05/21    PT Start Time 0845    PT Stop Time 0930    PT Time Calculation (min) 45 min    Equipment Utilized During Treatment Gait belt    Activity Tolerance Patient tolerated treatment well    Behavior During Therapy WFL for tasks assessed/performed             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)    Stroke (HCC)    Unspecified essential hypertension     Past Surgical History:  Procedure Laterality Date   BUBBLE STUDY  03/05/2020   Procedure: BUBBLE STUDY;  Surgeon: [name], MD;  Location: [facility] ENDOSCOPY;  Service: Cardiovascular;;   CESAREAN SECTION  1992   IR ANGIO INTRA EXTRACRAN SEL COM CAROTID INNOMINATE BILAT MOD SED  05/11/2021   IR ANGIO VERTEBRAL SEL SUBCLAVIAN INNOMINATE BILAT MOD SED  05/11/2021   LOOP RECORDER INSERTION N/A 03/05/2020   Procedure: LOOP RECORDER INSERTION;  Surgeon: [name], MD;  Location: [facility] INVASIVE CV LAB;  Service: Cardiovascular;  Laterality: N/A;   TEE WITHOUT CARDIOVERSION N/A 03/05/2020   Procedure: TRANSESOPHAGEAL ECHOCARDIOGRAM (TEE);  Surgeon: [name], MD;  Location: [facility] ENDOSCOPY;  Service: Cardiovascular;  Laterality: N/A;    There were no vitals filed for this visit.   Subjective Assessment - 07/05/21 0849     Subjective Doing fine. Denies any more dizziness.    Patient is accompained by: Family member    Patient Stated Goals improve balance,  strength.    Currently in Pain? No/denies                               [facility] Adult PT Treatment/Exercise - 07/05/21 0001       Neuro Re-ed    Neuro Re-ed Details  walking forward/back with cable 25# with CGA, walking R/L with able 15# with CGA 5x each way; romberg and 1/2 tandem + ball catch/throw in II bars x5 min, kicking ball in II bars x3 min      Lumbar Exercises: Stretches   Passive Hamstring Stretch Right;Left;1 rep;30 seconds    Passive Hamstring Stretch Limitations + DF; supine with strap      Lumbar Exercises: Aerobic   Recumbent Bike L2.5 x 6 min      Lumbar Exercises: Supine   Bridge 10 reps    Bridge Limitations straight leg bridge on pball    Other Supine Lumbar Exercises straight leg bridge + alt SLR 5x   assistance to maintain balance; c/o calf pain     Knee/Hip Exercises: Seated   Sit to Sand 2 sets;10 reps;without UE support   10x on airex; 10x on airex + VOR cancellation                      PT Short Term Goals - 05/17/21 1751  PT SHORT TERM GOAL #1   Title Independent with initial HEP with family SBA for safety    Time 2    Period Weeks    Status Achieved    Target Date 05/27/21               PT Long Term Goals - 06/22/21 0926       PT LONG TERM GOAL #1   Title Independent with advanced HEP    Status On-going      PT LONG TERM GOAL #2   Title Berg improved to at least 48/56 to demonstrate decreased falls risk    Status On-going      PT LONG TERM GOAL #3   Title TUG improved to </= 10 seconds with SPC or independent, no LOB to demonstrate improved functional gait speed and decreased falls risk    Status On-going      PT LONG TERM GOAL #4   Title Improved BUE and BLE strength to at least 4+/5    Status On-going                   Plan - 07/05/21 0931     Clinical Impression Statement Patient without new complaints today. Denies dizziness since last session. Worked on progressive core  strengthening ther-ex with addition of compliant surfaces. Patient required some manual assistance with more dynamic exercises. VOR cancellation was progressed on compliant surface as well, with patient requiring increased time and intermittent min A for imbalance. Initiated lateral resisted walking with focus on avoiding dragging the R foot. Throwing and kicking exercises were executed quite well, as patient infrequently required UE use. No complaints at end of session. Patient is progressing well towards goals.    PT Treatment/Interventions ADLs/Self Care Home Management;Cryotherapy;Electrical Stimulation;Iontophoresis 4mg /ml Dexamethasone;Moist Heat;Functional mobility training;Therapeutic activities;Therapeutic exercise;Balance training;Neuromuscular re-education;Patient/family education;Manual techniques;Energy conservation;Vestibular;Canalith Repostioning;Gait training;Stair training    PT Next Visit Plan BUE/LE and core strengthening, gait/balance stability. Manual/modalities as needed to facilitate progress.    Consulted and Agree with Plan of Care Patient    Family Member Consulted husband             Patient will benefit from skilled therapeutic intervention in order to improve the following deficits and impairments:  Abnormal gait, Decreased range of motion, Difficulty walking, Impaired UE functional use, Increased muscle spasms, Decreased endurance, Pain, Improper body mechanics, Decreased balance, Decreased strength, Decreased mobility  Visit Diagnosis: Muscle weakness (generalized)  Unsteadiness on feet  Other symptoms and signs involving the nervous system  Ataxic gait     Problem List Patient Active Problem List   Diagnosis Date Noted   Elevated liver enzymes 06/28/2021   Iron deficiency anemia 06/10/2021   TIA (transient ischemic attack) 05/09/2021   Chronic diastolic heart failure (grade 1 DD) 05/09/2021   Primary hypertension    Obstructive hydrocephalus (HCC)     Tachycardia    Acute cerebral infarction [facility])    Visual disturbance as complication of stroke    Transaminitis    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 coli UTI 11/01/2013   Anemia due to blood loss, acute 10/24/2013   Pelvic fracture (HCC) 10/23/2013   Hyperglycemia 09/18/2013   Routine general medical examination at a health care facility 09/18/2013   Elevated alkaline phosphatase level 09/18/2013   Hot flashes, menopausal 04/10/2013   Pain in joint, upper arm 04/10/2013    Anette Guarneri, PT, DPT 07/05/21 9:33 AM     [facility] [place] [facility] [address] [place], [place], [address] Phone: [phone]   Fax:  [phone]  Name: Felita Bump MRN: [id number] Date of Birth: Oct 06, 1961

## 2021-07-05 NOTE — Therapy (Signed)
[facility] [address] [place], [place], [address] Phone: [phone]   Fax:  [phone]  Occupational Therapy Treatment  Patient Details  Name: Judy Gutierrez MRN: [id number] Date of Birth: 10-05-1961 Referring Provider (OT): Claudette Laws, MD   Encounter Date: 07/05/2021   OT End of Session - 07/05/21 0804     Visit Number 7    Number of Visits 9    Date for OT Re-Evaluation 08/19/21    Authorization Type [employer] plan does not cover OT/ST)    OT Start Time 0802    OT Stop Time 0844    OT Time Calculation (min) 42 min    Activity Tolerance Patient tolerated treatment well    Behavior During Therapy WFL for tasks assessed/performed            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)    Stroke (HCC)    Unspecified essential hypertension     Past Surgical History:  Procedure Laterality Date   BUBBLE STUDY  03/05/2020   Procedure: BUBBLE STUDY;  Surgeon: [name], MD;  Location: [facility] ENDOSCOPY;  Service: Cardiovascular;;   CESAREAN SECTION  1992   IR ANGIO INTRA EXTRACRAN SEL COM CAROTID INNOMINATE BILAT MOD SED  05/11/2021   IR ANGIO VERTEBRAL SEL SUBCLAVIAN INNOMINATE BILAT MOD SED  05/11/2021   LOOP RECORDER INSERTION N/A 03/05/2020   Procedure: LOOP RECORDER INSERTION;  Surgeon: [name], MD;  Location: [facility] INVASIVE CV LAB;  Service: Cardiovascular;  Laterality: N/A;   TEE WITHOUT CARDIOVERSION N/A 03/05/2020   Procedure: TRANSESOPHAGEAL ECHOCARDIOGRAM (TEE);  Surgeon: [name], MD;  Location: [facility] ENDOSCOPY;  Service: Cardiovascular;  Laterality: N/A;    There were no vitals filed for this visit.   Subjective Assessment - 07/05/21 0803     Subjective  Pt reports things have been going well at home and she  would like to continue with therapy    Patient is accompanied by: Family member   Ajay (husband)   Pertinent History R cerebellar CVA 02/22/21, TIA 05/09/21, and hx of cerebellar CVA 01/29/20; HTN, HLD    Patient Stated Goals Drive and return to cleaning/cooking    Currently in Pain? No/denies             Treatment/Exercises - 07/05/21    Visual Perception Visual scanning activity on window, finding and erasing letters in alphabetical order while naming animals corresponding w/ each letter. Pt completed while standing on balance pad w/ additional emphasis on erasing only letters w/out crossing drawn boundary to incorporate [facility] and eye-hand coordination during activity. Activity facilitated visual, depth, and figure-ground perception, GMC, overhead reach, targeting, and balance. Pt crossed over border in 10/17 attempts and required 2 verbal cues for correction of error.  Second trial trail making activity alternating between numbers and letters, completed on window to facilitate figure-ground perception, scanning, processing speed, and eye-hand coordination; pt positioned standing on balance bad and was able to tolerate 7 min of standing on unstable surface w/out LOB    Putty Finding beads in yellow putty and pinching to retrieve w/ R hand w/out difficulty  Thumb-to-index finger opposition completed 5x along length of yellow putty w/ R hand; pt demo'd appropriate pad-to-pad prehension and mildly decreased coordination    Shoulder/Scapular Exercises Walking a small ball  up wall w/ both hands w/ OT providing verbal cues and modeling to increase RUE incorporation; completed 15x  Isometric standing wall ball shoulder circles using small ball for scapular stabilization w/ min verbal and tactile cues for alignment of R shoulder and wrist  Using R index, long, and ring fingers to walk ball forward and backward on tabletop; decreased coordination observed w/ OT grading task down to walk fingers forward  on RLE w/ increased success         OT Short Term Goals - 06/25/21 0804       OT SHORT TERM GOAL #1   Title Pt will demonstrate independence w/ HEP designed for increased BUE strength and ROM    Time 4    Period Weeks    Status Achieved    Target Date 06/18/21      OT SHORT TERM GOAL #2   Title Pt will improve R forearm supination by at least 4 degrees to improve participation in cooking tasks    Baseline R forearm supination 61* (LUE 71*)    Time 4    Period Weeks    Status Achieved   06/14/21 - 70*     OT SHORT TERM GOAL #3   Title Pt will complete 9-HPT w/ R hand in less than 1 minute to demonstrate improvement in functional FM skills w/ R, dominant UE    Baseline R hand 1 min, 6 sec; L hand 28 sec    Time 4    Period Weeks    Status Achieved   06/14/21 - 51 sec (15 sec improvement)     OT SHORT TERM GOAL #4   Title Pt will improve functional use of RUE as evidenced by completing bilateral integration task (e.g., folding clothes, cutting food) within pt-determined acceptable level of time    Time 4    Period Weeks    Status Achieved             OT Long Term Goals - 07/01/21 1340       OT LONG TERM GOAL #1   Title Pt will be independent w/ full HEP designed for increased coordination and functional use of RUE    Time 8    Period Weeks    Status On-going      OT LONG TERM GOAL #2   Title Pt will complete gardening task at Mod I, using AE/compensatory strategies for safety prn    Time 8    Period Weeks    Status On-going      OT LONG TERM GOAL #3   Title Pt will report completing at least 75% of desired household cleaning activities w/ SPV prn for safety    Time 8    Period Weeks    Status Achieved   06/25/21            Plan - 07/05/21 0814     Clinical Impression Statement OT continued to facilitate visual perception and RUE GM/FM coordination while incorporating balance and overhead reaching. Pt was able to complete activities w/ gaze sustained  overhead w/out difficulty, indicating decreased functional impact of dizziness and balance with static standing activities. Pt also demonstrated improved control/coordination proximally vs. distally w/ OT providing education and introducing additional scapular stabilization exercises.    OT Occupational Profile and History Detailed Assessment- Review of Records and additional review of physical, cognitive, psychosocial history related to current functional performance    Occupational performance deficits (Please refer to evaluation for details): ADL's;IADL's;Work;Leisure  Body Structure / Function / Physical Skills ADL;ROM;UE functional use;Balance;Decreased knowledge of use of DME;FMC;Vision;Dexterity;Body mechanics;GMC;Strength;Endurance;Coordination;IADL    Rehab Potential Good    Clinical Decision Making Several treatment options, min-mod task modification necessary    Comorbidities Affecting Occupational Performance: May have comorbidities impacting occupational performance    Modification or Assistance to Complete Evaluation  Min-Moderate modification of tasks or assist with assess necessary to complete eval    OT Frequency 1x / week   Decreased frequency due to insurance coverage   OT Duration 8 weeks    OT Treatment/Interventions Self-care/ADL training;Electrical Stimulation;Therapeutic exercise;Visual/perceptual remediation/compensation;Patient/family education;Splinting;Neuromuscular education;Moist Heat;Energy conservation;Building services engineer;Therapeutic activities;Balance training;Passive range of motion;Manual Therapy;DME and/or AE instruction;Ultrasound;Cryotherapy    Plan Continue to address wt bearing, GMC, and visual perceptual skills; address compensatory strategies for gardening tasks (seat, AE)    Consulted and Agree with Plan of Care Patient;Family member/caregiver    Family Member Consulted Husband (Ajay)            Patient will benefit from skilled therapeutic  intervention in order to improve the following deficits and impairments:   Body Structure / Function / Physical Skills: ADL, ROM, UE functional use, Balance, Decreased knowledge of use of DME, FMC, Vision, Dexterity, Body mechanics, GMC, Strength, Endurance, Coordination, IADL   Visit Diagnosis: Other lack of coordination  Muscle weakness (generalized)  Unsteadiness on feet  Other symptoms and signs involving the nervous system   Problem List Patient Active Problem List   Diagnosis Date Noted   Elevated liver enzymes 06/28/2021   Iron deficiency anemia 06/10/2021   TIA (transient ischemic attack) 05/09/2021   Chronic diastolic heart failure (grade 1 DD) 05/09/2021   Primary hypertension    Obstructive hydrocephalus (HCC)    Tachycardia    Acute cerebral infarction (HCC)    Visual disturbance as complication of stroke    Transaminitis    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 coli UTI 11/01/2013   Anemia due to blood loss, acute 10/24/2013   Pelvic fracture (HCC) 10/23/2013   Hyperglycemia 09/18/2013   Routine general medical examination at a health care facility 09/18/2013   Elevated alkaline phosphatase level 09/18/2013   Hot flashes, menopausal 04/10/2013   Pain in joint, upper arm 04/10/2013     Rosie Fate, OTR/L, MSOT 07/05/2021, 1:34 PM  [facility] [address] [place], [place], [address] Phone:  [phone]   Fax:  [phone]  Name: Judy Gutierrez MRN: [id number] Date of Birth: 20-Mar-1961

## 2021-07-08 ENCOUNTER — Ambulatory Visit: Payer: No Typology Code available for payment source | Admitting: Physical Therapy

## 2021-07-08 ENCOUNTER — Encounter
Payer: No Typology Code available for payment source | Attending: Registered Nurse | Admitting: Physical Medicine & Rehabilitation

## 2021-07-08 ENCOUNTER — Other Ambulatory Visit: Payer: Self-pay

## 2021-07-08 ENCOUNTER — Encounter: Payer: Self-pay | Admitting: Physical Medicine & Rehabilitation

## 2021-07-08 VITALS — BP 123/82 | HR 84 | Temp 98.2°F | Ht 62.0 in | Wt 123.2 lb

## 2021-07-08 DIAGNOSIS — M6281 Muscle weakness (generalized): Secondary | ICD-10-CM | POA: Diagnosis not present

## 2021-07-08 DIAGNOSIS — I63441 Cerebral infarction due to embolism of right cerebellar artery: Secondary | ICD-10-CM | POA: Insufficient documentation

## 2021-07-08 DIAGNOSIS — R29818 Other symptoms and signs involving the nervous system: Secondary | ICD-10-CM

## 2021-07-08 DIAGNOSIS — R2681 Unsteadiness on feet: Secondary | ICD-10-CM

## 2021-07-08 DIAGNOSIS — R260 Ataxic gait: Secondary | ICD-10-CM

## 2021-07-08 NOTE — Patient Instructions (Signed)
Please continue home exercise program

## 2021-07-08 NOTE — Therapy (Signed)
[facility] [address] [place], [place], [address] Phone: [phone]   Fax:  [phone]  Physical Therapy Treatment  Patient Details  Name: Judy Gutierrez MRN: [id number] Date of Birth: 10-Mar-1961 Referring Provider (PT): Kirsteins   Encounter Date: 07/08/2021   PT End of Session - 07/08/21 0843     Visit Number 15    Date for PT Re-Evaluation 08/05/21    PT Start Time 0800    PT Stop Time 0842    PT Time Calculation (min) 42 min    Equipment Utilized During Treatment Gait belt    Activity Tolerance Patient tolerated treatment well    Behavior During Therapy WFL for tasks assessed/performed             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)    Stroke (HCC)    Unspecified essential hypertension     Past Surgical History:  Procedure Laterality Date   BUBBLE STUDY  03/05/2020   Procedure: BUBBLE STUDY;  Surgeon: [name], MD;  [facility]: [facility] ENDOSCOPY;  Service: Cardiovascular;;   CESAREAN SECTION  1992   IR ANGIO INTRA EXTRACRAN SEL COM CAROTID INNOMINATE BILAT MOD SED  05/11/2021   IR ANGIO VERTEBRAL SEL SUBCLAVIAN INNOMINATE BILAT MOD SED  05/11/2021   LOOP RECORDER INSERTION N/A 03/05/2020   Procedure: LOOP RECORDER INSERTION;  Surgeon: [name], MD;  Location: [facility] INVASIVE CV LAB;  Service: Cardiovascular;  Laterality: N/A;   TEE WITHOUT CARDIOVERSION N/A 03/05/2020   Procedure: TRANSESOPHAGEAL ECHOCARDIOGRAM (TEE);  Surgeon: [name], MD;  Location: [facility] ENDOSCOPY;  Service: Cardiovascular;  Laterality: N/A;    There were no vitals filed for this visit.   Subjective Assessment - 07/08/21 0801     Subjective Doing fine. Would like to continue with PT until her Neurology appointment in November. Was able to do some cooking with a  little help from her sister in law.    Patient is accompained by: Family member   husband   Patient Stated Goals improve balance, strength.    Currently in Pain? No/denies                               [facility] Adult PT Treatment/Exercise - 07/08/21 0001       Neuro Re-ed    Neuro Re-ed Details  1 foot on rockerboard, stepping other foot forward on 4" step 10x each   variable UE support     Lumbar Exercises: Aerobic   Recumbent Bike L2.5 x 6 min      Lumbar Exercises: Supine   Other Supine Lumbar Exercises supine passing ball from feet to hands with knees bent 10x   manual assistance for coordination of movement     Lumbar Exercises: Sidelying   Other Sidelying Lumbar Exercises R/L side plank + clam 2x10 each   cues to slow down     Lumbar Exercises: Prone   Other Prone Lumbar Exercises high plank + alt hip ex 2x10   manual cues to maintain neutral spine; limited control d/t weakness/fatigue     Lumbar Exercises: Quadruped   Madcat/Old Horse 15 reps    Madcat/Old Horse Limitations manual encouragement for max movement    Opposite Arm/Leg Raise Right arm/Left leg;Left arm/Right leg;10  reps   bird dog crunch   Other Quadruped Lumbar Exercises child's pose 5x10"      Knee/Hip Exercises: Seated   Sit to Sand 1 set;10 reps;without UE support   on airex with EC; cues to recenter wt towards toes; intermittnet min A                      PT Short Term Goals - 05/17/21 1751       PT SHORT TERM GOAL #1   Title Independent with initial HEP with family SBA for safety    Time 2    Period Weeks    Status Achieved    Target Date 05/27/21               PT Long Term Goals - 06/22/21 0926       PT LONG TERM GOAL #1   Title Independent with advanced HEP    Status On-going      PT LONG TERM GOAL #2   Title Berg improved to at least 48/56 to demonstrate decreased falls risk    Status On-going      PT LONG TERM GOAL #3   Title TUG improved to  </= 10 seconds with SPC or independent, no LOB to demonstrate improved functional gait speed and decreased falls risk    Status On-going      PT LONG TERM GOAL #4   Title Improved BUE and BLE strength to at least 4+/5    Status On-going                   Plan - 07/08/21 0843     Clinical Impression Statement Patient arrived to session without new complaints. Reports that she was able to do some cooking with a little help from her sister in law recently. Worked on increasing challenge with mat core ther-ex today. Patient was able to do a modified side plank with increased difficulty on L rather than R side today. Fairly good core stability demonstrated despite remaining postural ataxia. Some slight dysmetria/incoordination evident with more challenging alternating exercises. Reported some LB discomfort after core strengthening, thus proceeded with gentle lumbopelvic stretching for relief.Dynamic balance exercises were performed with variable UE support but improved confidence and decreased reliance on reaching strategy. Patient tolerated session well and without complaints at end of session.    PT Treatment/Interventions ADLs/Self Care Home Management;Cryotherapy;Electrical Stimulation;Iontophoresis 4mg /ml Dexamethasone;Moist Heat;Functional mobility training;Therapeutic activities;Therapeutic exercise;Balance training;Neuromuscular re-education;Patient/family education;Manual techniques;Energy conservation;Vestibular;Canalith Repostioning;Gait training;Stair training    PT Next Visit Plan BUE/LE and core strengthening, gait/balance stability. Manual/modalities as needed to facilitate progress.    Consulted and Agree with Plan of Care Patient    Family Member Consulted husband             Patient will benefit from skilled therapeutic intervention in order to improve the following deficits and impairments:  Abnormal gait, Decreased range of motion, Difficulty walking, Impaired UE  functional use, Increased muscle spasms, Decreased endurance, Pain, Improper body mechanics, Decreased balance, Decreased strength, Decreased mobility  Visit Diagnosis: Muscle weakness (generalized)  Unsteadiness on feet  Other symptoms and signs involving the nervous system  Ataxic gait     Problem List Patient Active Problem List   Diagnosis Date Noted   Elevated liver enzymes 06/28/2021   Iron deficiency anemia 06/10/2021   TIA (transient ischemic attack) 05/09/2021   Chronic diastolic heart failure (grade 1 DD) 05/09/2021   Primary hypertension    Obstructive hydrocephalus (HCC)  Tachycardia    Acute cerebral infarction [facility])    Visual disturbance as complication of stroke    Transaminitis    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 coli UTI 11/01/2013   Anemia due to blood loss, acute 10/24/2013   Pelvic fracture (HCC) 10/23/2013   Hyperglycemia 09/18/2013   Routine general medical examination at a health care facility 09/18/2013   Elevated alkaline phosphatase level 09/18/2013   Hot flashes, menopausal 04/10/2013   Pain in joint, upper arm 04/10/2013     Anette Guarneri, PT, DPT 07/08/21 8:45 AM    [facility] [address] [place], [place], [address] Phone: [phone]   Fax:  [phone]  Name: Phinley Schall MRN: [id number] Date of Birth: December 12, 1960

## 2021-07-08 NOTE — Progress Notes (Signed)
Subjective:    Patient ID: Judy Gutierrez, female    DOB: 02-18-1961, 60 y.o.   MRN: [id number] Admit date: 02/26/2021 Discharge date: 03/16/2021 Judy Gutierrez is a 60 year old female who suffered a left cerebellar infarct in 2021, who on 03/03/2021 suffered a large right cerebellar infarct PICA territory L as well as a partially hemorrhagic superior cerebellar artery stroke Patient has completed inpatient rehabilitation at [facility] HPI  No falls, patient is ambulating without a cane short distances at home but mainly uses a cane outside the home.  She is undergoing outpatient OT and PT.  Apparently insurance has not covered OT but is covering PT we discussed the importance of OT and that this is just as important as the PT. Cooking and doing dishes , does laundry but does not carry laundry Pain Inventory Average Pain 0 Pain Right Now 0 My pain is  no pain  In the last 24 hours, has pain interfered with the following? General activity 0 Relation with others 0 Enjoyment of life 0 What TIME of day is your pain at its worst?no pain Sleep (in general) NA  Pain is worse with:  no pain Pain improves with:  no pain Relief from Meds: no pain  Family History  Problem Relation Age of Onset   Pulmonary embolism Mother    Hepatitis Father    Diabetes Father    Diabetes Mellitus II Sister    Breast cancer Neg Hx    Social History   Socioeconomic History   Marital status: Married    Spouse name: Not on file   Number of children: 1   Years of education: Not on file   Highest education level: Not on file  Occupational History   Occupation: convenience store    Comment: self employed  Tobacco Use   Smoking status: Never   Smokeless tobacco: Never  Substance and Sexual Activity   Alcohol use: No   Drug use: No   Sexual activity: Yes  Other Topics Concern   Not on file  Social History Narrative   Lives with husband   Right Handed   Drinks caffeine seldom   Social Determinants of  Health   Financial Resource Strain: Not on file  Food Insecurity: Not on file  Transportation Needs: Not on file  Physical Activity: Not on file  Stress: Not on file  Social Connections: Not on file   Past Surgical History:  Procedure Laterality Date   BUBBLE STUDY  03/05/2020   Procedure: BUBBLE STUDY;  Surgeon: [name], MD;  Location: [facility] ENDOSCOPY;  Service: Cardiovascular;;   CESAREAN SECTION  1992   IR ANGIO INTRA EXTRACRAN SEL COM CAROTID INNOMINATE BILAT MOD SED  05/11/2021   IR ANGIO VERTEBRAL SEL SUBCLAVIAN INNOMINATE BILAT MOD SED  05/11/2021   LOOP RECORDER INSERTION N/A 03/05/2020   Procedure: LOOP RECORDER INSERTION;  Surgeon: [name], MD;  Location: [facility] INVASIVE CV LAB;  Service: Cardiovascular;  Laterality: N/A;   TEE WITHOUT CARDIOVERSION N/A 03/05/2020   Procedure: TRANSESOPHAGEAL ECHOCARDIOGRAM (TEE);  Surgeon: [name], MD;  Location: [facility] ENDOSCOPY;  Service: Cardiovascular;  Laterality: N/A;   Past Surgical History:  Procedure Laterality Date   BUBBLE STUDY  03/05/2020   Procedure: BUBBLE STUDY;  Surgeon: [name], MD;  Location: [facility] ENDOSCOPY;  Service: Cardiovascular;;   CESAREAN SECTION  1992   IR ANGIO INTRA EXTRACRAN SEL COM CAROTID INNOMINATE BILAT MOD SED  05/11/2021   IR ANGIO VERTEBRAL SEL SUBCLAVIAN  INNOMINATE BILAT MOD SED  05/11/2021   LOOP RECORDER INSERTION N/A 03/05/2020   Procedure: LOOP RECORDER INSERTION;  Surgeon: [name], MD;  Location: [facility] INVASIVE CV LAB;  Service: Cardiovascular;  Laterality: N/A;   TEE WITHOUT CARDIOVERSION N/A 03/05/2020   Procedure: TRANSESOPHAGEAL ECHOCARDIOGRAM (TEE);  Surgeon: [name], MD;  Location: [facility] ENDOSCOPY;  Service: Cardiovascular;  Laterality: N/A;   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)     Stroke [facility])    Unspecified essential hypertension    BP 123/82   Pulse 84   Temp 98.2 F (36.8 C)   Ht 5\' 2"  (1.575 m)   Wt 123 lb 3.2 oz (55.9 kg)   LMP 12/08/2010   SpO2 97%   BMI 22.53 kg/m   Opioid Risk Score:   Fall Risk Score:  `1  Depression screen PHQ 2/9  Depression screen [facility] 2/9 07/08/2021 03/29/2021 12/04/2020 06/28/2018 10/13/2016 09/04/2015 04/10/2013  Decreased Interest 0 0 0 2 0 0 0  Down, Depressed, Hopeless 0 0 0 0 0 0 0  PHQ - 2 Score 0 0 0 2 0 0 0  Altered sleeping - 0 - 0 - - -  Tired, decreased energy - 1 - 0 - - -  Change in appetite - 0 - 0 - - -  Feeling bad or failure about yourself  - 0 - 0 - - -  Trouble concentrating - 0 - 0 - - -  Moving slowly or fidgety/restless - 0 - 0 - - -  Suicidal thoughts - 0 - 0 - - -  PHQ-9 Score - 1 - 2 - - -  Difficult doing work/chores - Somewhat difficult - Not difficult at all - - -     Review of Systems  Constitutional: Negative.   HENT: Negative.    Eyes: Negative.   Respiratory: Negative.    Cardiovascular: Negative.   Gastrointestinal: Negative.   Endocrine: Negative.   Genitourinary: Negative.   Musculoskeletal:  Positive for gait problem.  Skin: Negative.   Allergic/Immunologic: Negative.   Hematological:  Bruises/bleeds easily.       Plavix  Psychiatric/Behavioral: Negative.    All other systems reviewed and are negative.     Objective:   Physical Exam Vitals and nursing note reviewed.  Constitutional:      Appearance: She is obese.  HENT:     Head: Normocephalic and atraumatic.  Eyes:     Extraocular Movements: Extraocular movements intact.     Conjunctiva/sclera: Conjunctivae normal.     Pupils: Pupils are equal, round, and reactive to light.  Skin:    General: Skin is warm and dry.  Neurological:     Mental Status: She is alert and oriented to person, place, and time.     Comments: There is decreased fine motor movements  with right finger thumb opposition Positive  dysdiadochokinesis with rapid alternating supination pronation right extremity. Mild dysmetria right nose finger   Psychiatric:        Mood and Affect: Mood normal.        Behavior: Behavior normal.   Motor strength is 4/5 right deltoid bicep tricep grip 5/5 left deltoid bicep tricep grip 5/5 bilateral hip flexor knee extensor ankle dorsiflexor.       Assessment & Plan:   1.  Right hemiparesis as well  as decreased coordination and decreased fine motor related to right cerebellar right PICA infarct.  Overall doing very well from a functional standpoint. Continue outpatient PT and OT I will see the patient back in approximately 8 weeks. Neuro follow-up in November. We discussed the importance of completing her therapy as well as continuing with her home exercise program, patient has high degree of motivation.  Husband is very supportive

## 2021-07-12 ENCOUNTER — Other Ambulatory Visit: Payer: Self-pay

## 2021-07-12 ENCOUNTER — Encounter: Payer: Self-pay | Admitting: Occupational Therapy

## 2021-07-12 ENCOUNTER — Ambulatory Visit: Payer: No Typology Code available for payment source | Admitting: Physical Therapy

## 2021-07-12 ENCOUNTER — Encounter: Payer: Self-pay | Admitting: Physical Therapy

## 2021-07-12 ENCOUNTER — Ambulatory Visit: Payer: No Typology Code available for payment source | Admitting: Occupational Therapy

## 2021-07-12 DIAGNOSIS — M6281 Muscle weakness (generalized): Secondary | ICD-10-CM | POA: Diagnosis not present

## 2021-07-12 DIAGNOSIS — R278 Other lack of coordination: Secondary | ICD-10-CM

## 2021-07-12 DIAGNOSIS — R29818 Other symptoms and signs involving the nervous system: Secondary | ICD-10-CM

## 2021-07-12 DIAGNOSIS — R2681 Unsteadiness on feet: Secondary | ICD-10-CM

## 2021-07-12 DIAGNOSIS — R260 Ataxic gait: Secondary | ICD-10-CM

## 2021-07-12 NOTE — Therapy (Signed)
[facility] [address] [place], [place], [address] Phone: [phone]   Fax:  [phone]  Physical Therapy Treatment  Patient Details  Name: Judy Gutierrez MRN: [id number] Date of Birth: Sep 13, 1961 Referring Provider (PT): Kirsteins   Encounter Date: 07/12/2021   PT End of Session - 07/12/21 0936     Visit Number 16    Date for PT Re-Evaluation 08/05/21    PT Start Time 0847    PT Stop Time 0929    PT Time Calculation (min) 42 min    Equipment Utilized During Treatment Gait belt    Activity Tolerance Patient tolerated treatment well    Behavior During Therapy WFL for tasks assessed/performed             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)    Stroke (HCC)    Unspecified essential hypertension     Past Surgical History:  Procedure Laterality Date   BUBBLE STUDY  03/05/2020   Procedure: BUBBLE STUDY;  Surgeon: [name], MD;  Location: [facility] ENDOSCOPY;  Service: Cardiovascular;;   CESAREAN SECTION  1992   IR ANGIO INTRA EXTRACRAN SEL COM CAROTID INNOMINATE BILAT MOD SED  05/11/2021   IR ANGIO VERTEBRAL SEL SUBCLAVIAN INNOMINATE BILAT MOD SED  05/11/2021   LOOP RECORDER INSERTION N/A 03/05/2020   Procedure: LOOP RECORDER INSERTION;  Surgeon: [name], MD;  Location: [facility] INVASIVE CV LAB;  Service: Cardiovascular;  Laterality: N/A;   TEE WITHOUT CARDIOVERSION N/A 03/05/2020   Procedure: TRANSESOPHAGEAL ECHOCARDIOGRAM (TEE);  Surgeon: [name], MD;  Location: [facility] ENDOSCOPY;  Service: Cardiovascular;  Laterality: N/A;    There were no vitals filed for this visit.   Subjective Assessment - 07/12/21 0849     Subjective Appointment with the MD was fine- "He said I am progressing a lot." Had some pain in the L elbow but it is gone now.     Patient Stated Goals improve balance, strength.    Currently in Pain? No/denies                               [facility] Adult PT Treatment/Exercise - 07/12/21 0001       Neuro Re-ed    Neuro Re-ed Details  forward/back/sidestepping over cones with CGA, grapevine x4, R/L forward/backward stepping with 1 foot on rockerboard 10x each, forward/back tandem on foam beam x6   cues for proper foot positioning and manual cues for proper foot placement; cues for proper step sequencing     Lumbar Exercises: Aerobic   Recumbent Bike L2.5 x 6 min             Vestibular Treatment/Exercise - 07/12/21 0001       Vestibular Treatment/Exercise   Gaze Exercises X1 Viewing Horizontal;X1 Viewing Vertical      X1 Viewing Horizontal   Foot Position standing    Reps 10    Comments good gaze stability and no dizziness      X1 Viewing Vertical   Foot Position standing    Reps 10    Comments good gaze stability and no dizziness                    PT Education - 07/12/21 0935  Education Details update to HEP; edu on safe progression of standing VOR for increased challenge    Person(s) Educated Patient    Methods Explanation;Demonstration;Tactile cues;Verbal cues;Handout    Comprehension Verbalized understanding;Returned demonstration              PT Short Term Goals - 05/17/21 1751       PT SHORT TERM GOAL #1   Title Independent with initial HEP with family SBA for safety    Time 2    Period Weeks    Status Achieved    Target Date 05/27/21               PT Long Term Goals - 06/22/21 0926       PT LONG TERM GOAL #1   Title Independent with advanced HEP    Status On-going      PT LONG TERM GOAL #2   Title Berg improved to at least 48/56 to demonstrate decreased falls risk    Status On-going      PT LONG TERM GOAL #3   Title TUG improved to </= 10 seconds with SPC or independent, no LOB to demonstrate improved functional gait speed and  decreased falls risk    Status On-going      PT LONG TERM GOAL #4   Title Improved BUE and BLE strength to at least 4+/5    Status On-going                   Plan - 07/12/21 0936     Clinical Impression Statement Patient without complaints today. Worked on dynamic stepping balance with patient requiring manual and verbal cues as well as demonstration for proper foot placement and step sequencing. Updated grapevine into HEP for continued practice. Patient demonstrated good gaze stability at quicker pace today. Encouraged patient to try this with narrow BOS for increased challenge. Intermittent cues given today for improved control and foot placement. Patient tolerated duration of session well. No complaints upon leaving.    PT Treatment/Interventions ADLs/Self Care Home Management;Cryotherapy;Electrical Stimulation;Iontophoresis 4mg /ml Dexamethasone;Moist Heat;Functional mobility training;Therapeutic activities;Therapeutic exercise;Balance training;Neuromuscular re-education;Patient/family education;Manual techniques;Energy conservation;Vestibular;Canalith Repostioning;Gait training;Stair training    PT Next Visit Plan BUE/LE and core strengthening, gait/balance stability. Manual/modalities as needed to facilitate progress.    Consulted and Agree with Plan of Care Patient    Family Member Consulted husband             Patient will benefit from skilled therapeutic intervention in order to improve the following deficits and impairments:  Abnormal gait, Decreased range of motion, Difficulty walking, Impaired UE functional use, Increased muscle spasms, Decreased endurance, Pain, Improper body mechanics, Decreased balance, Decreased strength, Decreased mobility  Visit Diagnosis: Muscle weakness (generalized)  Unsteadiness on feet  Other symptoms and signs involving the nervous system  Ataxic gait     Problem List Patient Active Problem List   Diagnosis Date Noted   Elevated  liver enzymes 06/28/2021   Iron deficiency anemia 06/10/2021   TIA (transient ischemic attack) 05/09/2021   Chronic diastolic heart failure (grade 1 DD) 05/09/2021   Primary hypertension    Obstructive hydrocephalus (HCC)    Tachycardia    Acute cerebral infarction [facility])    Visual disturbance as complication of stroke    Transaminitis    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 coli UTI 11/01/2013   Anemia due to blood loss, acute 10/24/2013   Pelvic fracture (HCC) 10/23/2013   Hyperglycemia 09/18/2013   Routine general medical examination at a health care facility 09/18/2013   Elevated alkaline phosphatase level 09/18/2013   Hot flashes, menopausal 04/10/2013   Pain in joint, upper arm 04/10/2013     Anette Guarneri, PT, DPT 07/12/21 9:38 AM    [facility] [address] [place], [place], [address] Phone: [phone]   Fax:  [phone]  Name: Judy Gutierrez MRN: [id number] Date of Birth: Mar 08, 1961

## 2021-07-12 NOTE — Therapy (Signed)
[facility] [address] [place], [place], [address] Phone: [phone]   Fax:  [phone]  Occupational Therapy Treatment  Patient Details  Name: Judy Gutierrez MRN: [id number] Date of Birth: 1961-01-14 Referring Provider (OT): Claudette Laws, MD   Encounter Date: 07/12/2021   OT End of Session - 07/12/21 0805     Visit Number 8    Number of Visits 9    Date for OT Re-Evaluation 08/19/21    Authorization Type [employer] plan does not cover OT/ST)    OT Start Time 0800    OT Stop Time 0845    OT Time Calculation (min) 45 min    Activity Tolerance Patient tolerated treatment well    Behavior During Therapy WFL for tasks assessed/performed            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)    Stroke (HCC)    Unspecified essential hypertension     Past Surgical History:  Procedure Laterality Date   BUBBLE STUDY  03/05/2020   Procedure: BUBBLE STUDY;  Surgeon: [name], MD;  Location: [facility] ENDOSCOPY;  Service: Cardiovascular;;   CESAREAN SECTION  1992   IR ANGIO INTRA EXTRACRAN SEL COM CAROTID INNOMINATE BILAT MOD SED  05/11/2021   IR ANGIO VERTEBRAL SEL SUBCLAVIAN INNOMINATE BILAT MOD SED  05/11/2021   LOOP RECORDER INSERTION N/A 03/05/2020   Procedure: LOOP RECORDER INSERTION;  Surgeon: [name], MD;  Location: [facility] INVASIVE CV LAB;  Service: Cardiovascular;  Laterality: N/A;   TEE WITHOUT CARDIOVERSION N/A 03/05/2020   Procedure: TRANSESOPHAGEAL ECHOCARDIOGRAM (TEE);  Surgeon: [name], MD;  Location: [facility] ENDOSCOPY;  Service: Cardiovascular;  Laterality: N/A;    There were no vitals filed for this visit.   Subjective Assessment - 07/12/21 0802     Subjective  Pt reports her Phys Med and Rehab physician appt went well  and that he adjusted the height of her walking cane    Patient is accompanied by: Family member   Ajay (husband)   Pertinent History R cerebellar CVA 02/22/21, TIA 05/09/21, and hx of cerebellar CVA 01/29/20; HTN, HLD    Patient Stated Goals Drive and return to cleaning/cooking    Currently in Pain? Yes    Pain Score 3     Pain Location Arm    Pain Orientation Left    Pain Descriptors / Indicators Aching    Pain Onset In the past 7 days    Pain Frequency Intermittent             Treatment/Exercises - 07/12/21    Coordination Activity Using R hand to scoop dried beans from a container 1 at a time w/ a spoon and placing on holes of pegboard; to retrieve beans, pt picked each up 1 at a time using red (2#) resistance clothespin to place in open container. Activity facilitated GM/FMC, pinch strength, force gradation, and visual perception    Weight Bearing Weight shifting to R side to retrieve 1" blocks from floor level w/ contralateral LUE; transferred block to RUE to place into a stack on elevated tabletop surface. Completed set in reverse to return blocks to container on floor level. Activity facilitate inhibition of tone, facilitation of muscle activation and proprioceptive input, decr in RUE ataxia w/ min cueing for facilitation of  typical movement patterns and initial sequencing of activities.    Functional Mobility Ambulation w/out AD while holding plate of dried beans in R hand and cup of water in L hand; pt able to navigate therapy gym w/out drops/spills, no dizziness, and 2 slight LOB. OT discussed strategy of pausing to "reset" balance prn w/ pt demonstrating good carryover of learned strategy w/ remainder of activity.  Ambulating w/ cane on L side while holding water in R hand; pt demonstrated no LOB and reported no add'l dizziness w/ 3 minor spills due to RUE ataxia         OT Education - 07/12/21 0817     Education Details Discussed compensatory strategies while gardening in the  yard; recommended pt not carry planters w/out assist at this time and to sit down (discussed options for garden seats) when working in the yard during the Spring    Person(s) Educated Patient;Spouse    Methods Explanation;Demonstration;Tactile cues;Verbal cues;Handout    Comprehension Verbalized understanding;Returned demonstration;Verbal cues required             OT Short Term Goals - 06/25/21 0804       OT SHORT TERM GOAL #1   Title Pt will demonstrate independence w/ HEP designed for increased BUE strength and ROM    Time 4    Period Weeks    Status Achieved    Target Date 06/18/21      OT SHORT TERM GOAL #2   Title Pt will improve R forearm supination by at least 4 degrees to improve participation in cooking tasks    Baseline R forearm supination 61* (LUE 71*)    Time 4    Period Weeks    Status Achieved   06/14/21 - 70*     OT SHORT TERM GOAL #3   Title Pt will complete 9-HPT w/ R hand in less than 1 minute to demonstrate improvement in functional FM skills w/ R, dominant UE    Baseline R hand 1 min, 6 sec; L hand 28 sec    Time 4    Period Weeks    Status Achieved   06/14/21 - 51 sec (15 sec improvement)     OT SHORT TERM GOAL #4   Title Pt will improve functional use of RUE as evidenced by completing bilateral integration task (e.g., folding clothes, cutting food) within pt-determined acceptable level of time    Time 4    Period Weeks    Status Achieved             OT Long Term Goals - 07/01/21 1340       OT LONG TERM GOAL #1   Title Pt will be independent w/ full HEP designed for increased coordination and functional use of RUE    Time 8    Period Weeks    Status On-going      OT LONG TERM GOAL #2   Title Pt will complete gardening task at Mod I, using AE/compensatory strategies for safety prn    Time 8    Period Weeks    Status On-going      OT LONG TERM GOAL #3   Title Pt will report completing at least 75% of desired household cleaning activities  w/ SPV prn for safety    Time 8    Period Weeks    Status Achieved   06/25/21            Plan - 07/12/21 5053  Clinical Impression Statement OT discussed conservative pain management strateiges (e.g., ice pack) for aching discomfort in LUE and pt verbalized understanding. Considering improvements in [place] and coordination observed in previous sessions, OT facilitated functional FM task this session w/ pt demo'ing good control as well as problem-solving during activity. Pt required some verbal cues and modeling for sequencing during therapeutic activities, but success improved w/ repetition. Pt also demonstrated improved control during activities incorporating RUE wt bearing, which may indicate likely continued benefit from wt bearing for proprioceptive input, inhibition of tone, and muscle activation.    OT Occupational Profile and History Detailed Assessment- Review of Records and additional review of physical, cognitive, psychosocial history related to current functional performance    Occupational performance deficits (Please refer to evaluation for details): ADL's;IADL's;Work;Leisure    Body Structure / Function / Physical Skills ADL;ROM;UE functional use;Balance;Decreased knowledge of use of DME;FMC;Vision;Dexterity;Body mechanics;GMC;Strength;Endurance;Coordination;IADL    Rehab Potential Good    Clinical Decision Making Several treatment options, min-mod task modification necessary    Comorbidities Affecting Occupational Performance: May have comorbidities impacting occupational performance    Modification or Assistance to Complete Evaluation  Min-Moderate modification of tasks or assist with assess necessary to complete eval    OT Frequency 1x / week   Decreased frequency due to insurance coverage   OT Duration 8 weeks    OT Treatment/Interventions Self-care/ADL training;Electrical Stimulation;Therapeutic exercise;Visual/perceptual remediation/compensation;Patient/family  education;Splinting;Neuromuscular education;Moist Heat;Energy conservation;Building services engineer;Therapeutic activities;Balance training;Passive range of motion;Manual Therapy;DME and/or AE instruction;Ultrasound;Cryotherapy    Plan Gardening activity; continue to address wt bearing, GMC, and visual perceptual skills    Consulted and Agree with Plan of Care Patient;Family member/caregiver    Family Member Consulted Husband (Ajay)            Patient will benefit from skilled therapeutic intervention in order to improve the following deficits and impairments:   Body Structure / Function / Physical Skills: ADL, ROM, UE functional use, Balance, Decreased knowledge of use of DME, FMC, Vision, Dexterity, Body mechanics, GMC, Strength, Endurance, Coordination, IADL  Visit Diagnosis: Other lack of coordination  Muscle weakness (generalized)  Unsteadiness on feet  Other symptoms and signs involving the nervous system   Problem List Patient Active Problem List   Diagnosis Date Noted   Elevated liver enzymes 06/28/2021   Iron deficiency anemia 06/10/2021   TIA (transient ischemic attack) 05/09/2021   Chronic diastolic heart failure (grade 1 DD) 05/09/2021   Primary hypertension    Obstructive hydrocephalus (HCC)    Tachycardia    Acute cerebral infarction (HCC)    Visual disturbance as complication of stroke    Transaminitis    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 coli  UTI 11/01/2013   Anemia due to blood loss, acute 10/24/2013   Pelvic fracture (HCC) 10/23/2013   Hyperglycemia 09/18/2013   Routine general medical examination at a health care facility 09/18/2013   Elevated alkaline phosphatase level 09/18/2013   Hot flashes, menopausal 04/10/2013   Pain in joint,  upper arm 04/10/2013    Rosie Fate, OTR/L, MSOT  07/12/2021, 8:28 AM  [facility] [address] [place], [place], [address] Phone: [phone]   Fax:  [phone]  Name: Judy Gutierrez MRN: [id number] Date of Birth: April 20, 1961

## 2021-07-15 ENCOUNTER — Encounter: Payer: Self-pay | Admitting: Physical Therapy

## 2021-07-15 ENCOUNTER — Ambulatory Visit: Payer: No Typology Code available for payment source | Admitting: Physical Therapy

## 2021-07-15 ENCOUNTER — Other Ambulatory Visit: Payer: Self-pay

## 2021-07-15 DIAGNOSIS — R260 Ataxic gait: Secondary | ICD-10-CM

## 2021-07-15 DIAGNOSIS — M6281 Muscle weakness (generalized): Secondary | ICD-10-CM

## 2021-07-15 DIAGNOSIS — R2681 Unsteadiness on feet: Secondary | ICD-10-CM

## 2021-07-15 DIAGNOSIS — R29818 Other symptoms and signs involving the nervous system: Secondary | ICD-10-CM

## 2021-07-15 NOTE — Therapy (Signed)
[facility] [address] [place], [place], [address] Phone: [phone]   Fax:  [phone]  Physical Therapy Treatment  Patient Details  Name: Judy Gutierrez MRN: [id number] Date of Birth: [birth year] Referring Provider (PT): Kirsteins   Encounter Date: 07/15/2021   PT End of Session - 07/15/21 0848     Visit Number 17    Date for PT Re-Evaluation 08/05/21    PT Start Time 0800    PT Stop Time 0845    PT Time Calculation (min) 45 min    Equipment Utilized During Treatment Gait belt    Activity Tolerance Patient tolerated treatment well    Behavior During Therapy WFL for tasks assessed/performed             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)    Stroke (HCC)    Unspecified essential hypertension     Past Surgical History:  Procedure Laterality Date   BUBBLE STUDY  03/05/2020   Procedure: BUBBLE STUDY;  Surgeon: [name], MD;  Location: [facility] ENDOSCOPY;  Service: Cardiovascular;;   CESAREAN SECTION  1992   IR ANGIO INTRA EXTRACRAN SEL COM CAROTID INNOMINATE BILAT MOD SED  05/11/2021   IR ANGIO VERTEBRAL SEL SUBCLAVIAN INNOMINATE BILAT MOD SED  05/11/2021   LOOP RECORDER INSERTION N/A 03/05/2020   Procedure: LOOP RECORDER INSERTION;  Surgeon: [name], MD;  Location: [facility] INVASIVE CV LAB;  Service: Cardiovascular;  Laterality: N/A;   TEE WITHOUT CARDIOVERSION N/A 03/05/2020   Procedure: TRANSESOPHAGEAL ECHOCARDIOGRAM (TEE);  Surgeon: [name], MD;  Location: [facility] ENDOSCOPY;  Service: Cardiovascular;  Laterality: N/A;    There were no vitals filed for this visit.   Subjective Assessment - 07/15/21 0803     Subjective Been doing fine.    Patient is accompained by: Family member    Patient Stated Goals improve balance, strength.     Currently in Pain? No/denies                               [facility] Adult PT Treatment/Exercise - 07/15/21 0001       Neuro Re-ed    Neuro Re-ed Details  resisted backwards walking with cable 20# 5x; R/L step up 6" + opposite SKTC with 1-2 finger support on handrail 10x each; R/L staggered stance + red medball pass hand to hand 10x; tandem walk with EC and CGA-mod A 2x53ft      Lumbar Exercises: Aerobic   Recumbent Bike L3.0 x 6 min      Lumbar Exercises: Seated   Other Seated Lumbar Exercises sitting russian twist on dynadisc with yellow medball 2x20   intermittent LOB; c/o difficulty     Lumbar Exercises: Sidelying   Other Sidelying Lumbar Exercises R/L side plank on elbow/toes 10x each      Lumbar Exercises: Prone   Other Prone Lumbar Exercises bird dog + row with 3# 10x   intermittent LOB   Other Prone Lumbar Exercises quadruped R/L row 3# 10x                       PT Short Term Goals - 05/17/21 1751       PT SHORT TERM GOAL #1   Title Independent with  initial HEP with family SBA for safety    Time 2    Period Weeks    Status Achieved    Target Date 05/27/21               PT Long Term Goals - 06/22/21 0926       PT LONG TERM GOAL #1   Title Independent with advanced HEP    Status On-going      PT LONG TERM GOAL #2   Title Berg improved to at least 48/56 to demonstrate decreased falls risk    Status On-going      PT LONG TERM GOAL #3   Title TUG improved to </= 10 seconds with SPC or independent, no LOB to demonstrate improved functional gait speed and decreased falls risk    Status On-going      PT LONG TERM GOAL #4   Title Improved BUE and BLE strength to at least 4+/5    Status On-going                   Plan - 07/15/21 0848     Clinical Impression Statement Patient without complaints this AM. Continued working on core stability exercises today. Patient was able to perform side plank with mild instability.  Quadruped activities also demonstrated R>L hip instability and imbalance. Good effort demonstrated with all activities despite difficulty. Cueing to increase L step length required with resisted backwards walking. Minimal UE support required to prevent LOB with dynamic step ups today. Patient performed very challenging dynamic balance activities with EC today, requiring up to mod A for imbalance. Overall patient is progressing very well and able to tolerate more challenging activities. No complaints at end of session.    PT Treatment/Interventions ADLs/Self Care Home Management;Cryotherapy;Electrical Stimulation;Iontophoresis 4mg /ml Dexamethasone;Moist Heat;Functional mobility training;Therapeutic activities;Therapeutic exercise;Balance training;Neuromuscular re-education;Patient/family education;Manual techniques;Energy conservation;Vestibular;Canalith Repostioning;Gait training;Stair training    PT Next Visit Plan BUE/LE and core strengthening, gait/balance stability. Manual/modalities as needed to facilitate progress.    Consulted and Agree with Plan of Care Patient    Family Member Consulted husband             Patient will benefit from skilled therapeutic intervention in order to improve the following deficits and impairments:  Abnormal gait, Decreased range of motion, Difficulty walking, Impaired UE functional use, Increased muscle spasms, Decreased endurance, Pain, Improper body mechanics, Decreased balance, Decreased strength, Decreased mobility  Visit Diagnosis: Muscle weakness (generalized)  Unsteadiness on feet  Other symptoms and signs involving the nervous system  Ataxic gait     Problem List Patient Active Problem List   Diagnosis Date Noted   Elevated liver enzymes 06/28/2021   Iron deficiency anemia 06/10/2021   TIA (transient ischemic attack) 05/09/2021   Chronic diastolic heart failure (grade 1 DD) 05/09/2021   Primary hypertension    Obstructive hydrocephalus  (HCC)    Tachycardia    Acute cerebral infarction [facility])    Visual disturbance as complication of stroke    Transaminitis    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 coli UTI 11/01/2013   Anemia due to blood loss, acute 10/24/2013   Pelvic fracture (HCC) 10/23/2013   Hyperglycemia 09/18/2013   Routine general medical examination at a health care facility 09/18/2013   Elevated alkaline phosphatase level 09/18/2013   Hot flashes, menopausal 04/10/2013   Pain in joint, upper arm 04/10/2013    Anette Guarneri, PT, DPT 07/15/21 8:50 AM   [facility] [address] [place], [place], [address] Phone: [phone]   Fax:  [phone]  Name: Davan Hark MRN: [id number] Date of Birth: 09-18-1961

## 2021-07-21 ENCOUNTER — Other Ambulatory Visit: Payer: Self-pay

## 2021-07-21 ENCOUNTER — Ambulatory Visit
Payer: No Typology Code available for payment source | Attending: Physical Medicine & Rehabilitation | Admitting: Physical Therapy

## 2021-07-21 ENCOUNTER — Encounter: Payer: Self-pay | Admitting: Physical Therapy

## 2021-07-21 ENCOUNTER — Ambulatory Visit: Payer: No Typology Code available for payment source | Admitting: Occupational Therapy

## 2021-07-21 DIAGNOSIS — R260 Ataxic gait: Secondary | ICD-10-CM | POA: Diagnosis present

## 2021-07-21 DIAGNOSIS — R2681 Unsteadiness on feet: Secondary | ICD-10-CM | POA: Diagnosis present

## 2021-07-21 DIAGNOSIS — M6281 Muscle weakness (generalized): Secondary | ICD-10-CM | POA: Diagnosis present

## 2021-07-21 DIAGNOSIS — R29818 Other symptoms and signs involving the nervous system: Secondary | ICD-10-CM | POA: Insufficient documentation

## 2021-07-21 DIAGNOSIS — R471 Dysarthria and anarthria: Secondary | ICD-10-CM | POA: Diagnosis present

## 2021-07-21 DIAGNOSIS — R278 Other lack of coordination: Secondary | ICD-10-CM | POA: Diagnosis present

## 2021-07-21 DIAGNOSIS — Z8673 Personal history of transient ischemic attack (TIA), and cerebral infarction without residual deficits: Secondary | ICD-10-CM | POA: Diagnosis present

## 2021-07-21 DIAGNOSIS — I10 Essential (primary) hypertension: Secondary | ICD-10-CM | POA: Insufficient documentation

## 2021-07-21 NOTE — Therapy (Signed)
[facility] [address] [place], [place], [address] Phone: [phone]   Fax:  [phone]  Physical Therapy Treatment  Patient Details  Name: Judy Gutierrez MRN: [id number] Date of Birth: 1961-07-16 Referring Provider (PT): Kirsteins   Encounter Date: 07/21/2021   PT End of Session - 07/21/21 0840     Visit Number 18    Date for PT Re-Evaluation 08/05/21    PT Start Time 0754    PT Stop Time 0838    PT Time Calculation (min) 44 min    Equipment Utilized During Treatment Gait belt    Activity Tolerance Patient tolerated treatment well    Behavior During Therapy WFL for tasks assessed/performed             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)    Stroke (HCC)    Unspecified essential hypertension     Past Surgical History:  Procedure Laterality Date   BUBBLE STUDY  03/05/2020   Procedure: BUBBLE STUDY;  Surgeon: [name], MD;  Location: [facility] ENDOSCOPY;  Service: Cardiovascular;;   CESAREAN SECTION  1992   IR ANGIO INTRA EXTRACRAN SEL COM CAROTID INNOMINATE BILAT MOD SED  05/11/2021   IR ANGIO VERTEBRAL SEL SUBCLAVIAN INNOMINATE BILAT MOD SED  05/11/2021   LOOP RECORDER INSERTION N/A 03/05/2020   Procedure: LOOP RECORDER INSERTION;  Surgeon: [name], MD;  Location: [facility] INVASIVE CV LAB;  Service: Cardiovascular;  Laterality: N/A;   TEE WITHOUT CARDIOVERSION N/A 03/05/2020   Procedure: TRANSESOPHAGEAL ECHOCARDIOGRAM (TEE);  Surgeon: [name], MD;  Location: [facility] ENDOSCOPY;  Service: Cardiovascular;  Laterality: N/A;    There were no vitals filed for this visit.   Subjective Assessment - 07/21/21 0756     Subjective Doing good. Denies falls.    Patient is accompained by: Family member    Patient Stated Goals improve balance, strength.     Currently in Pain? No/denies                               [facility] Adult PT Treatment/Exercise - 07/21/21 0001       Neuro Re-ed    Neuro Re-ed Details  R/L SLS to multidirectional cones with intermittent light UE support and CGA-min A 3 rounds each foot; grapevine with CGA and encouragement to cross  towards midline; R/L backwards stepping over cone + pick up with CGA-min A d/t imbalance x3 times d/t LOB; 1 foot on dynadisc + green TB paloff press R/L and resisted trunk rotation R/L 10x with CGA; fwd/back wt shifts on rockerboard, then adding R/L hand ball throw/catch and 2 handed throw/catch x5 min      Lumbar Exercises: Aerobic   Recumbent Bike L3.0 x 6 min                     PT Education - 07/21/21 0840     Education Details update to HEP    Person(s) Educated Patient    Methods Explanation;Demonstration;Tactile cues;Verbal cues;Handout    Comprehension Verbalized understanding;Returned demonstration              PT Short Term Goals - 05/17/21 1751       PT SHORT TERM GOAL #1   Title Independent with initial  HEP with family SBA for safety    Time 2    Period Weeks    Status Achieved    Target Date 05/27/21               PT Long Term Goals - 06/22/21 0926       PT LONG TERM GOAL #1   Title Independent with advanced HEP    Status On-going      PT LONG TERM GOAL #2   Title Berg improved to at least 48/56 to demonstrate decreased falls risk    Status On-going      PT LONG TERM GOAL #3   Title TUG improved to </= 10 seconds with SPC or independent, no LOB to demonstrate improved functional gait speed and decreased falls risk    Status On-going      PT LONG TERM GOAL #4   Title Improved BUE and BLE strength to at least 4+/5    Status On-going                   Plan - 07/21/21 0841     Clinical Impression Statement Patient without new complaints today. Worked on dynamic balance activities, challenging patient  to limit UE support. Patient still consistently demonstrates increased instability on R>L LE with all activities. Required CGA-min A with all activities d/t intermittent LOB. Patient did improved ankle control with weight shifts on rockerboard today, thus added dual task activity that appeared challenging d/t limited R hand control and coordination. Updated HEP with a balance activity that was performed safely today and advised patient to perform this at counter top for safety. Patient reported understanding and without complaints at end of session. Patient is progressing well towards goals.    PT Treatment/Interventions ADLs/Self Care Home Management;Cryotherapy;Electrical Stimulation;Iontophoresis 4mg /ml Dexamethasone;Moist Heat;Functional mobility training;Therapeutic activities;Therapeutic exercise;Balance training;Neuromuscular re-education;Patient/family education;Manual techniques;Energy conservation;Vestibular;Canalith Repostioning;Gait training;Stair training    PT Next Visit Plan BUE/LE and core strengthening, gait/balance stability. Manual/modalities as needed to facilitate progress.    Consulted and Agree with Plan of Care Patient    Family Member Consulted husband             Patient will benefit from skilled therapeutic intervention in order to improve the following deficits and impairments:  Abnormal gait, Decreased range of motion, Difficulty walking, Impaired UE functional use, Increased muscle spasms, Decreased endurance, Pain, Improper body mechanics, Decreased balance, Decreased strength, Decreased mobility  Visit Diagnosis: Muscle weakness (generalized)  Unsteadiness on feet  Other symptoms and signs involving the nervous system  Ataxic gait     Problem List Patient Active Problem List   Diagnosis Date Noted   Elevated liver enzymes 06/28/2021   Iron deficiency anemia 06/10/2021   TIA (transient ischemic attack) 05/09/2021   Chronic diastolic heart failure (grade  1 DD) 05/09/2021   Primary hypertension    Obstructive hydrocephalus (HCC)    Tachycardia    Acute cerebral infarction [facility])    Visual disturbance as complication of stroke    Transaminitis    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 coli UTI 11/01/2013   Anemia due to blood loss, acute 10/24/2013   Pelvic fracture (HCC) 10/23/2013   Hyperglycemia 09/18/2013   Routine general medical examination at a health care facility 09/18/2013   Elevated alkaline phosphatase level 09/18/2013   Hot flashes, menopausal 04/10/2013   Pain in joint, upper arm 04/10/2013    Anette Guarneri, PT, DPT 07/21/21 8:46 AM   [facility] [address]. [place], [place], [address] Phone: [phone]   Fax:  [phone]  Name: Judy Gutierrez MRN: [id number] Date of Birth: 07/18/1961

## 2021-07-23 ENCOUNTER — Ambulatory Visit: Payer: No Typology Code available for payment source | Admitting: Physical Therapy

## 2021-07-23 ENCOUNTER — Encounter: Payer: Self-pay | Admitting: Physical Therapy

## 2021-07-23 ENCOUNTER — Ambulatory Visit: Payer: No Typology Code available for payment source | Admitting: Occupational Therapy

## 2021-07-23 ENCOUNTER — Other Ambulatory Visit: Payer: Self-pay

## 2021-07-23 ENCOUNTER — Encounter: Payer: Self-pay | Admitting: Occupational Therapy

## 2021-07-23 DIAGNOSIS — R260 Ataxic gait: Secondary | ICD-10-CM

## 2021-07-23 DIAGNOSIS — R29818 Other symptoms and signs involving the nervous system: Secondary | ICD-10-CM

## 2021-07-23 DIAGNOSIS — M6281 Muscle weakness (generalized): Secondary | ICD-10-CM | POA: Diagnosis not present

## 2021-07-23 DIAGNOSIS — R278 Other lack of coordination: Secondary | ICD-10-CM

## 2021-07-23 DIAGNOSIS — R2681 Unsteadiness on feet: Secondary | ICD-10-CM

## 2021-07-23 NOTE — Patient Instructions (Signed)
HEP consolidation:  Stand and reach 1 foot to multidirectional cones Grapevine Stepping backwards over cone Walking while throwing/catching tennis ball

## 2021-07-23 NOTE — Therapy (Signed)
[facility] [address] [place], [place], [address] Phone: [phone]   Fax:  [phone]  Physical Therapy Treatment  Patient Details  Name: Judy Gutierrez MRN: [id number] Date of Birth: 02/14/1961 Referring Provider (PT): Kirsteins   Encounter Date: 07/23/2021   PT End of Session - 07/23/21 0843     Visit Number 19    Date for PT Re-Evaluation 08/05/21    PT Start Time 0759    PT Stop Time 0842    PT Time Calculation (min) 43 min    Equipment Utilized During Treatment Gait belt    Activity Tolerance Patient tolerated treatment well    Behavior During Therapy WFL for tasks assessed/performed             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)    Stroke (HCC)    Unspecified essential hypertension     Past Surgical History:  Procedure Laterality Date   BUBBLE STUDY  03/05/2020   Procedure: BUBBLE STUDY;  Surgeon: [name], MD;  Location: [facility] ENDOSCOPY;  Service: Cardiovascular;;   CESAREAN SECTION  1992   IR ANGIO INTRA EXTRACRAN SEL COM CAROTID INNOMINATE BILAT MOD SED  05/11/2021   IR ANGIO VERTEBRAL SEL SUBCLAVIAN INNOMINATE BILAT MOD SED  05/11/2021   LOOP RECORDER INSERTION N/A 03/05/2020   Procedure: LOOP RECORDER INSERTION;  Surgeon: [name], MD;  Location: [facility] INVASIVE CV LAB;  Service: Cardiovascular;  Laterality: N/A;   TEE WITHOUT CARDIOVERSION N/A 03/05/2020   Procedure: TRANSESOPHAGEAL ECHOCARDIOGRAM (TEE);  Surgeon: [name], MD;  Location: [facility] ENDOSCOPY;  Service: Cardiovascular;  Laterality: N/A;    There were no vitals filed for this visit.   Subjective Assessment - 07/23/21 0801     Subjective Doing okay.    Patient Stated Goals improve balance, strength.    Currently in Pain? No/denies                                [facility] Adult PT Treatment/Exercise - 07/23/21 0001       Neuro Re-ed    Neuro Re-ed Details  fwd/back and R/L wt shifts on rockerboard, then adding tennis ball throw/catch with B hands, then just R hand in II bars with intermittent UE support   1 episode of LOB, requiring leaning into II bars- patient noted some transient R hip pain     Lumbar Exercises: Aerobic   Elliptical L1.0 x 6 min      Knee/Hip Exercises: Standing   Other Standing Knee Exercises backwards cable walk 5x 25# with CGA                 Balance Exercises - 07/23/21 0001       Balance Exercises: Standing   Other Standing Exercises Comments R/L fwd/back stepping on bosu with heavy 1 UE support 10x each; R/L step up/back on bosu with intermittent 1 UE support 5x                PT Education - 07/23/21 0814     Education Details update/consolidation to HEP    Person(s) Educated Patient    Methods Explanation;Demonstration;Tactile cues;Verbal cues;Handout    Comprehension Verbalized understanding  PT Short Term Goals - 05/17/21 1751       PT SHORT TERM GOAL #1   Title Independent with initial HEP with family SBA for safety    Time 2    Period Weeks    Status Achieved    Target Date 05/27/21               PT Long Term Goals - 06/22/21 0926       PT LONG TERM GOAL #1   Title Independent with advanced HEP    Status On-going      PT LONG TERM GOAL #2   Title Berg improved to at least 48/56 to demonstrate decreased falls risk    Status On-going      PT LONG TERM GOAL #3   Title TUG improved to </= 10 seconds with SPC or independent, no LOB to demonstrate improved functional gait speed and decreased falls risk    Status On-going      PT LONG TERM GOAL #4   Title Improved BUE and BLE strength to at least 4+/5    Status On-going                   Plan - 07/23/21 0844     Clinical Impression Statement Patient arrived  to session without new complaints. Able to trial elliptical for warm up and balance challenge, which patient performed safely with CGA-SBA. Discussed HEP and updated for max benefit- patient reported understanding. Continued with dynamic balance exercises with addition of dual task. Patient with tendency to demonstrate slightly increased instability when dual tasking but demonstrates improved R hand dysmetria with catching activities. Patient demonstrated 1 episode of LOB today, requiring leaning into II bars, after which patient noted some transient R hip pain. Patient still demonstrates some hesitancy on compliant surfaces, thus recommend continued practice using rockerboard and bosu ball in future sessions. Patient tolerated session well and without complaints at end of session. Progressing well towards goals.    PT Treatment/Interventions ADLs/Self Care Home Management;Cryotherapy;Electrical Stimulation;Iontophoresis 4mg /ml Dexamethasone;Moist Heat;Functional mobility training;Therapeutic activities;Therapeutic exercise;Balance training;Neuromuscular re-education;Patient/family education;Manual techniques;Energy conservation;Vestibular;Canalith Repostioning;Gait training;Stair training    PT Next Visit Plan work on compliant surface such as bosu and rockerboard; add in dual task; BUE/LE and core strengthening, gait/balance stability. Manual/modalities as needed to facilitate progress.    Consulted and Agree with Plan of Care Patient    Family Member Consulted husband             Patient will benefit from skilled therapeutic intervention in order to improve the following deficits and impairments:  Abnormal gait, Decreased range of motion, Difficulty walking, Impaired UE functional use, Increased muscle spasms, Decreased endurance, Pain, Improper body mechanics, Decreased balance, Decreased strength, Decreased mobility  Visit Diagnosis: Muscle weakness (generalized)  Unsteadiness on feet  Other  symptoms and signs involving the nervous system  Ataxic gait     Problem List Patient Active Problem List   Diagnosis Date Noted   Elevated liver enzymes 06/28/2021   Iron deficiency anemia 06/10/2021   TIA (transient ischemic attack) 05/09/2021   Chronic diastolic heart failure (grade 1 DD) 05/09/2021   Primary hypertension    Obstructive hydrocephalus (HCC)    Tachycardia    Acute cerebral infarction [facility]    Visual disturbance as complication of stroke    Transaminitis    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 coli UTI 11/01/2013   Anemia due to blood loss, acute 10/24/2013   Pelvic fracture (HCC) 10/23/2013   Hyperglycemia 09/18/2013   Routine general medical examination at a health care facility 09/18/2013   Elevated alkaline phosphatase level 09/18/2013   Hot flashes, menopausal 04/10/2013   Pain in joint, upper arm 04/10/2013    Anette Guarneri, PT, DPT 07/23/21 8:46 AM   [facility] [address] [place], [place], [address] Phone: [phone]   Fax:  [phone]  Name: Judy Gutierrez MRN: [id number] Date of Birth: 09/08/1961

## 2021-07-24 NOTE — Therapy (Signed)
[facility] [place] [facility] [address] [place], [place], [address] Phone: [phone]   Fax:  [phone]  Occupational Therapy Treatment  Patient Details  Name: Judy Gutierrez MRN: [id number] Date of Birth: 25-Jul-1961 Referring Provider (OT): Claudette Laws, MD   Encounter Date: 07/23/2021   OT End of Session - 07/23/21 0848     Visit Number 9    Number of Visits 13   Initial 9 + 4 additional   Date for OT Re-Evaluation 08/25/21    Authorization Type [employer] plan does not cover OT/ST)    OT Start Time [phone]    OT Stop Time [phone]    OT Time Calculation (min) 44 min    Activity Tolerance Patient tolerated treatment well    Behavior During Therapy WFL for tasks assessed/performed            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)    Stroke (HCC)    Unspecified essential hypertension     Past Surgical History:  Procedure Laterality Date   BUBBLE STUDY  03/05/2020   Procedure: BUBBLE STUDY;  Surgeon: [name], MD;  Location: [facility] ENDOSCOPY;  Service: Cardiovascular;;   CESAREAN SECTION  1992   IR ANGIO INTRA EXTRACRAN SEL COM CAROTID INNOMINATE BILAT MOD SED  05/11/2021   IR ANGIO VERTEBRAL SEL SUBCLAVIAN INNOMINATE BILAT MOD SED  05/11/2021   LOOP RECORDER INSERTION N/A 03/05/2020   Procedure: LOOP RECORDER INSERTION;  Surgeon: [name], MD;  Location: [facility] INVASIVE CV LAB;  Service: Cardiovascular;  Laterality: N/A;   TEE WITHOUT CARDIOVERSION N/A 03/05/2020   Procedure: TRANSESOPHAGEAL ECHOCARDIOGRAM (TEE);  Surgeon: [name], MD;  Location: [facility] ENDOSCOPY;  Service: Cardiovascular;  Laterality: N/A;    There were no vitals filed for this visit.   Subjective Assessment - 07/23/21 0847     Subjective  Pt states she is able to do  almost everything she wants to do at home, but frequently requires adaptations/modifications and extended time for success    Patient is accompanied by: Family member   Ajay (husband)   Pertinent History R cerebellar CVA 02/22/21, TIA 05/09/21, and hx of cerebellar CVA 01/29/20; HTN, HLD    Patient Stated Goals Drive and return to cleaning/cooking    Currently in Pain? Yes    Pain Score 3     Pain Location Wrist    Pain Orientation Right;Left    Pain Descriptors / Indicators Discomfort    Pain Type Acute pain    Pain Onset Today   Pt states she was doing an exercise in PT that was hurting her wrist, but discomfort was improving after cessation of exercise            Treatment/Exercises - 07/23/21    ADLs Completed simulated ADLs including handwriting, self-feeding, bilateral coordination, dressing, and functional transfers and transitions. OT provided recommendations for compensatory strategies and AE prn. Pt demonstrated decreased smoothness of lines and coordination w/ handwriting and was unable to complete simulated feeding w/ RUE, reporting she uses LUE primarily at home. Increased success w/ handwriting using weighted utensil and built-up handles. Pt demonstrated no difficulty w/ bilateral integration and continues to experience limitations w/ balance and ambulation.    Clothespins Activity Placing and removing resistance clothespins (1-8#) on secured rope positioned at an incline  w/ RUE to facilitate functional overhead reach in sitting, increased GMC, and visual perception. Completed 2nd set focusing on increased reach forward outside of BoS w/ min difficulty. Pt demonstrated good control, coordination, and targeting during activity.         OT Education - 07/23/21 0848     Education Details Discussed potential benefit of AE for handwriting and self-feeding activities (weighted, built-up handle). Also reviewed progress toward goals and POC.    Person(s) Educated Patient    Methods  Explanation;Demonstration;Tactile cues;Verbal cues    Comprehension Verbalized understanding;Returned demonstration;Verbal cues required             OT Short Term Goals - 06/25/21 0804       OT SHORT TERM GOAL #1   Title Pt will demonstrate independence w/ HEP designed for increased BUE strength and ROM    Time 4    Period Weeks    Status Achieved    Target Date 06/18/21      OT SHORT TERM GOAL #2   Title Pt will improve R forearm supination by at least 4 degrees to improve participation in cooking tasks    Baseline R forearm supination 61* (LUE 71*)    Time 4    Period Weeks    Status Achieved   06/14/21 - 70*     OT SHORT TERM GOAL #3   Title Pt will complete 9-HPT w/ R hand in less than 1 minute to demonstrate improvement in functional FM skills w/ R, dominant UE    Baseline R hand 1 min, 6 sec; L hand 28 sec    Time 4    Period Weeks    Status Achieved   06/14/21 - 51 sec (15 sec improvement)     OT SHORT TERM GOAL #4   Title Pt will improve functional use of RUE as evidenced by completing bilateral integration task (e.g., folding clothes, cutting food) within pt-determined acceptable level of time    Time 4    Period Weeks    Status Achieved             OT Long Term Goals - 07/23/21 1134       OT LONG TERM GOAL #1   Title Pt will be independent w/ full HEP designed for increased coordination and functional use of RUE    Time 8    Period Weeks    Status Achieved   07/23/21     OT LONG TERM GOAL #2   Title Pt will complete gardening task at Mod I, using AE/compensatory strategies for safety prn    Time 4    Period Weeks    Status On-going    Target Date 08/20/21      OT LONG TERM GOAL #3   Title Pt will report completing at least 75% of desired household cleaning activities w/ SPV prn for safety    Time 8    Period Weeks    Status Achieved   06/25/21     OT LONG TERM GOAL #4   Title Pt will be able to write a 5-7 word sentence, using AE prn, in less  than 1 min, 30 sec demonstrating increased smoothness of lines and legibility    Baseline 1 min, 43 seconds to complete 5-word sentence w/out AE    Time 4    Period Weeks    Status New    Target Date 08/20/21      OT LONG TERM GOAL #5  Title To improve participation w/ self-feeding, pt will be able to scoop 5 beans from a bowl one-at-a-time w/ R, dominant UE and bring to mouth, using adaptive utensils prn    Baseline Greater than 60 secs w/ multiple drops when using spoon to scoop 5 beans from bowl and placing in can    Time 4    Period Weeks    Status New    Target Date 08/20/21             Plan - 07/23/21 1143     Clinical Impression Statement Pt is a 60 y/o female seen in OP OT for R-sided residual deficits s/p cerebellar CVA in May. Tx has incorporated neuromuscular reeducation, therapeutic exercises, coordination activities, pt and caregiver education, and development of a pt-specific HEP; pt has shown improvements in GM and FM control and coordination, independence w/ HEP, and participation/safety w/ ADLs. Due to success and reported compliance w/ exercises and coordination activities at home, OT focused on simulated ADLs this session w/ pt demonstrating significant difficulty w/ handwriting and simulated eating. OT disccused compensatory strategies and potential benefit of AE w/ pt who was receptive.  Pt also completed Upper Extremity Functional Index (UEFI) w/ a score of 53/80 indicating min-moderate difficulty w/ various functional activities. Pt will benefit from continued skilled occupational therapy services to further improve participation and safety w/ ADLs.    OT Occupational Profile and History Detailed Assessment- Review of Records and additional review of physical, cognitive, psychosocial history related to current functional performance    Occupational performance deficits (Please refer to evaluation for details): ADL's;IADL's;Work;Leisure    Body Structure / Function /  Physical Skills ADL;ROM;UE functional use;Balance;Decreased knowledge of use of DME;FMC;Vision;Dexterity;Body mechanics;GMC;Strength;Endurance;Coordination;IADL    Rehab Potential Good    Clinical Decision Making Several treatment options, min-mod task modification necessary    Comorbidities Affecting Occupational Performance: May have comorbidities impacting occupational performance    Modification or Assistance to Complete Evaluation  Min-Moderate modification of tasks or assist with assess necessary to complete eval    OT Frequency 1x / week   Decreased frequency due to insurance coverage   OT Duration 8 weeks    OT Treatment/Interventions Self-care/ADL training;Electrical Stimulation;Therapeutic exercise;Visual/perceptual remediation/compensation;Patient/family education;Splinting;Neuromuscular education;Moist Heat;Energy conservation;Building services engineer;Therapeutic activities;Balance training;Passive range of motion;Manual Therapy;DME and/or AE instruction;Ultrasound;Cryotherapy    Plan Assess TMT; trial adaptive utensils w/ self-feeding and handwriting (weighted, built-up handled, practice cutting)    Consulted and Agree with Plan of Care Patient;Family member/caregiver    Family Member Consulted Husband Advertising account planner)            Patient will benefit from skilled therapeutic intervention in order to improve the following deficits and impairments:   Body Structure / Function / Physical Skills: ADL, ROM, UE functional use, Balance, Decreased knowledge of use of DME, FMC, Vision, Dexterity, Body mechanics, GMC, Strength, Endurance, Coordination, IADL  Visit Diagnosis: Other lack of coordination  Muscle weakness (generalized)  Unsteadiness on feet  Other symptoms and signs involving the nervous system   Problem List Patient Active Problem List   Diagnosis Date Noted   Elevated liver enzymes 06/28/2021   Iron deficiency anemia 06/10/2021   TIA (transient ischemic attack)  05/09/2021   Chronic diastolic heart failure (grade 1 DD) 05/09/2021   Primary hypertension    Obstructive hydrocephalus (HCC)    Tachycardia    Acute cerebral infarction (HCC)    Visual disturbance as complication of stroke    Transaminitis    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 coli UTI 11/01/2013   Anemia due to blood loss, acute 10/24/2013   Pelvic fracture (HCC) 10/23/2013   Hyperglycemia 09/18/2013   Routine general medical examination at a health care facility 09/18/2013   Elevated alkaline phosphatase level 09/18/2013   Hot flashes, menopausal 04/10/2013   Pain in joint, upper arm 04/10/2013    Rosie Fate, OTR/L, MSOT 07/24/2021, 12:09 PM  [facility] [place] [facility] [address] [place], [place], [address] Phone: [phone]   Fax:  [phone]  Name: Judy Gutierrez MRN: [id number] Date of Birth: 1961/02/28

## 2021-07-26 ENCOUNTER — Encounter: Payer: Self-pay | Admitting: Physical Therapy

## 2021-07-26 ENCOUNTER — Encounter: Payer: Self-pay | Admitting: Occupational Therapy

## 2021-07-26 ENCOUNTER — Ambulatory Visit (INDEPENDENT_AMBULATORY_CARE_PROVIDER_SITE_OTHER): Payer: No Typology Code available for payment source

## 2021-07-26 ENCOUNTER — Ambulatory Visit: Payer: No Typology Code available for payment source | Admitting: Occupational Therapy

## 2021-07-26 ENCOUNTER — Other Ambulatory Visit: Payer: Self-pay

## 2021-07-26 ENCOUNTER — Ambulatory Visit: Payer: No Typology Code available for payment source | Admitting: Physical Therapy

## 2021-07-26 DIAGNOSIS — R471 Dysarthria and anarthria: Secondary | ICD-10-CM

## 2021-07-26 DIAGNOSIS — R260 Ataxic gait: Secondary | ICD-10-CM

## 2021-07-26 DIAGNOSIS — I10 Essential (primary) hypertension: Secondary | ICD-10-CM

## 2021-07-26 DIAGNOSIS — R278 Other lack of coordination: Secondary | ICD-10-CM

## 2021-07-26 DIAGNOSIS — M6281 Muscle weakness (generalized): Secondary | ICD-10-CM

## 2021-07-26 DIAGNOSIS — Z8673 Personal history of transient ischemic attack (TIA), and cerebral infarction without residual deficits: Secondary | ICD-10-CM

## 2021-07-26 DIAGNOSIS — I639 Cerebral infarction, unspecified: Secondary | ICD-10-CM

## 2021-07-26 DIAGNOSIS — R29818 Other symptoms and signs involving the nervous system: Secondary | ICD-10-CM

## 2021-07-26 DIAGNOSIS — R2681 Unsteadiness on feet: Secondary | ICD-10-CM

## 2021-07-26 NOTE — Therapy (Signed)
[facility] [address] [place], [place], [address] Phone: [phone]   Fax:  [phone]  Physical Therapy Treatment  Patient Details  Name: Judy Gutierrez MRN: [id number] Date of Birth: December 02, 1960 Referring Provider (PT): Kirsteins   Encounter Date: 07/26/2021   PT End of Session - 07/26/21 1019     Visit Number 20    Date for PT Re-Evaluation 08/05/21    PT Start Time 0930    PT Stop Time 1014    PT Time Calculation (min) 44 min    Equipment Utilized During Treatment Gait belt    Activity Tolerance Patient tolerated treatment well    Behavior During Therapy WFL for tasks assessed/performed             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)    Stroke (HCC)    Unspecified essential hypertension     Past Surgical History:  Procedure Laterality Date   BUBBLE STUDY  03/05/2020   Procedure: BUBBLE STUDY;  Surgeon: [name], MD;  Location: [facility] ENDOSCOPY;  Service: Cardiovascular;;   CESAREAN SECTION  1992   IR ANGIO INTRA EXTRACRAN SEL COM CAROTID INNOMINATE BILAT MOD SED  05/11/2021   IR ANGIO VERTEBRAL SEL SUBCLAVIAN INNOMINATE BILAT MOD SED  05/11/2021   LOOP RECORDER INSERTION N/A 03/05/2020   Procedure: LOOP RECORDER INSERTION;  Surgeon: [name], MD;  Location: [facility] INVASIVE CV LAB;  Service: Cardiovascular;  Laterality: N/A;   TEE WITHOUT CARDIOVERSION N/A 03/05/2020   Procedure: TRANSESOPHAGEAL ECHOCARDIOGRAM (TEE);  Surgeon: [name], MD;  Location: [facility] ENDOSCOPY;  Service: Cardiovascular;  Laterality: N/A;    There were no vitals filed for this visit.   Subjective Assessment - 07/26/21 0948     Subjective Patient reports no issues, states she is performing HEP daily, has progressed from initial exercises and is focusing on the  balnce training.    Patient is accompained by: Family member    Patient Stated Goals improve balance, strength.    Currently in Pain? No/denies                               [facility] Adult PT Treatment/Exercise - 07/26/21 0949       Ambulation/Gait   Gait Comments Patient walked iwth quick steps, noted difficutly advancing RLE with increaed speed of movement.      High Level Balance   High Level Balance Comments Repeated with LLE, required occasional CGA-min A for steadying, and hands were on and off rails for balance.      Neuro Re-ed    Neuro Re-ed Details  Quick steps side to side and forward and back with UUE support. Patient faced therapist and stepped in all directions, changing upon therapist's command unexpectedly.      Lumbar Exercises: Aerobic   Elliptical L1.0 x 5 min      Lumbar Exercises: Supine   Dead Bug 5 reps    Dead Bug Limitations 2 x 5    Bridge 10 reps    Bridge Limitations Performed LE walk outs and back while holding brideg, 2 x 3 sets.      Knee/Hip Exercises: Sidelying   Other Sidelying Knee/Hip Exercises side planks x 10  PT Education - 07/26/21 1018     Education Details HEP reviewed and remains pertinent, patient has progressed beyond the first exercises prescribed.    Person(s) Educated Patient    Methods Explanation    Comprehension Verbalized understanding              PT Short Term Goals - 05/17/21 1751       PT SHORT TERM GOAL #1   Title Independent with initial HEP with family SBA for safety    Time 2    Period Weeks    Status Achieved    Target Date 05/27/21               PT Long Term Goals - 07/26/21 1025       PT LONG TERM GOAL #1   Title Independent with advanced HEP    Baseline HEP evolving as patient progresses.    Time 12    Period Weeks    Status On-going    Target Date 08/05/21                   Plan - 07/26/21 1019     Clinical Impression  Statement Patient reports no new complaints. She worked on Elliptical at level 1.0 x 5 minutes. Next performed trunk stability activities on the mat, then moved to perform baance and coordination training. Introduced speed of movement activities to improve functional balance  and mobility. She reports she is managing seps using rail.    Stability/Clinical Decision Making Evolving/Moderate complexity    Clinical Decision Making Moderate    Rehab Potential Good    PT Frequency 2x / week    PT Duration 12 weeks    PT Treatment/Interventions ADLs/Self Care Home Management;Cryotherapy;Electrical Stimulation;Iontophoresis 4mg /ml Dexamethasone;Moist Heat;Functional mobility training;Therapeutic activities;Therapeutic exercise;Balance training;Neuromuscular re-education;Patient/family education;Manual techniques;Energy conservation;Vestibular;Canalith Repostioning;Gait training;Stair training    PT Next Visit Plan Continue iwth strength, balance, coordination activities, including speed of movement, incorporate into functional mobility.    Consulted and Agree with Plan of Care Patient    Family Member Consulted husband             Patient will benefit from skilled therapeutic intervention in order to improve the following deficits and impairments:  Abnormal gait, Decreased range of motion, Difficulty walking, Impaired UE functional use, Increased muscle spasms, Decreased endurance, Pain, Improper body mechanics, Decreased balance, Decreased strength, Decreased mobility  Visit Diagnosis: Other lack of coordination  Muscle weakness (generalized)  Primary hypertension  Dysarthria and anarthria  Unsteadiness on feet  Ataxic gait  History of CVA (cerebrovascular accident)     Problem List Patient Active Problem List   Diagnosis Date Noted   Elevated liver enzymes 06/28/2021   Iron deficiency anemia 06/10/2021   TIA (transient ischemic attack) 05/09/2021   Chronic diastolic heart failure  (grade 1 DD) 05/09/2021   Primary hypertension    Obstructive hydrocephalus (HCC)    Tachycardia    Acute cerebral infarction [facility])    Visual disturbance as complication of stroke    Transaminitis    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 coli UTI 11/01/2013   Anemia due to blood loss, acute 10/24/2013   Pelvic fracture (HCC) 10/23/2013   Hyperglycemia 09/18/2013   Routine general medical examination at a health care facility 09/18/2013   Elevated alkaline phosphatase level 09/18/2013   Hot flashes, menopausal 04/10/2013   Pain in joint, upper arm 04/10/2013    Iona Beard, DPT 07/26/2021, 10:27 AM  [facility] [address] [place], [place], [address] Phone: [phone]   Fax:  [phone]  Name: Jenin Birdsall MRN: [id number] Date of Birth: [birth year]

## 2021-07-26 NOTE — Therapy (Signed)
[facility] [address]. [place], [place], [address] Phone: [phone]   Fax:  [phone]  Occupational Therapy Treatment  Patient Details  Name: Judy Gutierrez MRN: [id number] Date of Birth: October 25, 1960 Referring Provider (OT): Claudette Laws, MD   Encounter Date: 07/26/2021   OT End of Session - 07/26/21 0852     Visit Number 10    Number of Visits 13   Initial 9 + 4 additional   Date for OT Re-Evaluation 08/25/21    Authorization Type [employer] plan does not cover OT/ST)    OT Start Time 0847    OT Stop Time 0929    OT Time Calculation (min) 42 min    Activity Tolerance Patient tolerated treatment well    Behavior During Therapy WFL for tasks assessed/performed            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)    Stroke (HCC)    Unspecified essential hypertension     Past Surgical History:  Procedure Laterality Date   BUBBLE STUDY  03/05/2020   Procedure: BUBBLE STUDY;  Surgeon: [name], MD;  Location: [facility] ENDOSCOPY;  Service: Cardiovascular;;   CESAREAN SECTION  1992   IR ANGIO INTRA EXTRACRAN SEL COM CAROTID INNOMINATE BILAT MOD SED  05/11/2021   IR ANGIO VERTEBRAL SEL SUBCLAVIAN INNOMINATE BILAT MOD SED  05/11/2021   LOOP RECORDER INSERTION N/A 03/05/2020   Procedure: LOOP RECORDER INSERTION;  Surgeon: [name], MD;  Location: [facility] INVASIVE CV LAB;  Service: Cardiovascular;  Laterality: N/A;   TEE WITHOUT CARDIOVERSION N/A 03/05/2020   Procedure: TRANSESOPHAGEAL ECHOCARDIOGRAM (TEE);  Surgeon: [name], MD;  Location: [facility] ENDOSCOPY;  Service: Cardiovascular;  Laterality: N/A;    There were no vitals filed for this visit.   Subjective Assessment - 07/26/21 0851     Subjective  Pt stated that the weighted  and built-up handle utensils felt easier for her to use    Patient is accompanied by: Family member   Ajay (husband)   Pertinent History R cerebellar CVA 02/22/21, TIA 05/09/21, and hx of cerebellar CVA 01/29/20; HTN, HLD    Patient Stated Goals Drive and return to cleaning/cooking    Currently in Pain? No/denies             Treatment/Exercises - 07/26/21    ADLs: Self-Feeding  Simulated self-feeding w/ spoon (picking up beans one at a time) as well as cutting putty w/ a knife and fork to assess participation and safety during activity. OT trialed various AE, including built-up handle, weighted, and curved utensils in comparison to standard utensils w/ pt demonstrating most success w/ weighted spoon and built-up handle on knife.    IADLs: Handwriting Pt practiced free writing on standard lined paper using weighted pen w/ fair legibility. Also completed tracing letters of word search activity using built-up handle pen to facilitate coordination, sizing, and alignment of letters during handwriting; pt completed activity w/ min deviations and demonstrated fair smoothness of lines          OT Education - 07/26/21 0926     Education Details Education provided on benefit/intention of built-up handle and weighted utensils for both self-feeding and writing; foam built-up handles administered (blue, red, and tan). Also reviewed safety strategies to consider during meal prep tasks  due to decreased coordination w/ RUE.    Person(s) Educated Patient    Methods Explanation;Demonstration;Verbal cues    Comprehension Verbalized understanding;Returned demonstration             OT Short Term Goals - 06/25/21 0804       OT SHORT TERM GOAL #1   Title Pt will demonstrate independence w/ HEP designed for increased BUE strength and ROM    Time 4    Period Weeks    Status Achieved    Target Date 06/18/21      OT SHORT TERM GOAL #2   Title Pt will improve R forearm supination by at least 4 degrees to  improve participation in cooking tasks    Baseline R forearm supination 61* (LUE 71*)    Time 4    Period Weeks    Status Achieved   06/14/21 - 70*     OT SHORT TERM GOAL #3   Title Pt will complete 9-HPT w/ R hand in less than 1 minute to demonstrate improvement in functional FM skills w/ R, dominant UE    Baseline R hand 1 min, 6 sec; L hand 28 sec    Time 4    Period Weeks    Status Achieved   06/14/21 - 51 sec (15 sec improvement)     OT SHORT TERM GOAL #4   Title Pt will improve functional use of RUE as evidenced by completing bilateral integration task (e.g., folding clothes, cutting food) within pt-determined acceptable level of time    Time 4    Period Weeks    Status Achieved             OT Long Term Goals - 07/26/21 0920       OT LONG TERM GOAL #1   Title Pt will be independent w/ full HEP designed for increased coordination and functional use of RUE    Time 8    Period Weeks    Status Achieved   07/23/21     OT LONG TERM GOAL #2   Title Pt will complete gardening task at Mod I, using AE/compensatory strategies for safety prn    Time 4    Period Weeks    Status On-going      OT LONG TERM GOAL #3   Title Pt will report completing at least 75% of desired household cleaning activities w/ SPV prn for safety    Time 8    Period Weeks    Status Achieved   06/25/21     OT LONG TERM GOAL #4   Title Pt will be able to write a 5-7 word sentence, using AE prn, in less than 1 min, 30 sec demonstrating increased smoothness of lines and legibility    Baseline 1 min, 43 seconds to complete 5-word sentence w/out AE    Time 4    Period Weeks    Status On-going      OT LONG TERM GOAL #5   Title To improve participation w/ self-feeding, pt will be able to scoop 5 beans from a bowl one-at-a-time w/ R, dominant UE and bring to mouth, using adaptive utensils prn    Baseline Greater than 60 secs w/ multiple drops when using spoon to scoop 5 beans from bowl and placing in can     Time 4    Period Weeks    Status On-going             Plan - 07/26/21 1660  Clinical Impression Statement Session today focused primarily on improved participation and safety w/ self-feeding. OT trialed built-up handle and weighted utensils w/ pt demonstrating increased success with both AE compared to no AE, but the most success w/ weighted utensil as evidenced by less drops and decreased tremor. OT administered built-up handles and will assist pt w/ identifying options for self-purchase of weighted utensils next session. Handwriting and benefit of using built-up handle/weighted writing implements was also addressed this session w/ pt exhibiting increased smoothness of lines using weighted pen. To continue to improve pacing and control during handwriting tasks, OT facilitated handwriting practice w/ word search activity and provided additional options that pt is able to carryover to home.    OT Occupational Profile and History Detailed Assessment- Review of Records and additional review of physical, cognitive, psychosocial history related to current functional performance    Occupational performance deficits (Please refer to evaluation for details): ADL's;IADL's;Work;Leisure    Body Structure / Function / Physical Skills ADL;ROM;UE functional use;Balance;Decreased knowledge of use of DME;FMC;Vision;Dexterity;Body mechanics;GMC;Strength;Endurance;Coordination;IADL    Rehab Potential Good    Clinical Decision Making Several treatment options, min-mod task modification necessary    Comorbidities Affecting Occupational Performance: May have comorbidities impacting occupational performance    Modification or Assistance to Complete Evaluation  Min-Moderate modification of tasks or assist with assess necessary to complete eval    OT Frequency 1x / week   Decreased frequency due to insurance coverage   OT Duration 8 weeks    OT Treatment/Interventions Self-care/ADL training;Electrical  Stimulation;Therapeutic exercise;Visual/perceptual remediation/compensation;Patient/family education;Splinting;Neuromuscular education;Moist Heat;Energy conservation;Building services engineer;Therapeutic activities;Balance training;Passive range of motion;Manual Therapy;DME and/or AE instruction;Ultrasound;Cryotherapy    Plan Assess TMT; provide handout for recommendation of weighted untensils for self-feeding; continue to address RUE coordination/GMC and handwriting    Consulted and Agree with Plan of Care Patient;Family member/caregiver    Family Member Consulted Husband (Ajay)            Patient will benefit from skilled therapeutic intervention in order to improve the following deficits and impairments:   Body Structure / Function / Physical Skills: ADL, ROM, UE functional use, Balance, Decreased knowledge of use of DME, FMC, Vision, Dexterity, Body mechanics, GMC, Strength, Endurance, Coordination, IADL   Visit Diagnosis: Other lack of coordination  Muscle weakness (generalized)  Other symptoms and signs involving the nervous system   Problem List Patient Active Problem List   Diagnosis Date Noted   Elevated liver enzymes 06/28/2021   Iron deficiency anemia 06/10/2021   TIA (transient ischemic attack) 05/09/2021   Chronic diastolic heart failure (grade 1 DD) 05/09/2021   Primary hypertension    Obstructive hydrocephalus (HCC)    Tachycardia    Acute cerebral infarction (HCC)    Visual disturbance as complication of stroke    Transaminitis    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 coli UTI 11/01/2013   Anemia due to blood loss, acute 10/24/2013   Pelvic fracture (HCC) 10/23/2013   Hyperglycemia 09/18/2013  Routine general medical examination at a health care facility 09/18/2013   Elevated alkaline phosphatase level 09/18/2013   Hot flashes, menopausal 04/10/2013   Pain in joint, upper arm 04/10/2013    Rosie Fate, OTR/L, MSOT 07/26/2021, 4:12 PM  [facility] [address] [place], [place], [address] Phone: [phone]   Fax:  [phone]  Name: Judy Gutierrez MRN: [id number] Date of Birth: [birth year]

## 2021-07-28 ENCOUNTER — Other Ambulatory Visit: Payer: Self-pay

## 2021-07-28 ENCOUNTER — Ambulatory Visit: Payer: No Typology Code available for payment source | Admitting: Physical Therapy

## 2021-07-28 ENCOUNTER — Encounter: Payer: Self-pay | Admitting: Physical Therapy

## 2021-07-28 DIAGNOSIS — R278 Other lack of coordination: Secondary | ICD-10-CM

## 2021-07-28 DIAGNOSIS — Z8673 Personal history of transient ischemic attack (TIA), and cerebral infarction without residual deficits: Secondary | ICD-10-CM

## 2021-07-28 DIAGNOSIS — R29818 Other symptoms and signs involving the nervous system: Secondary | ICD-10-CM

## 2021-07-28 DIAGNOSIS — R260 Ataxic gait: Secondary | ICD-10-CM

## 2021-07-28 DIAGNOSIS — M6281 Muscle weakness (generalized): Secondary | ICD-10-CM

## 2021-07-28 DIAGNOSIS — R2681 Unsteadiness on feet: Secondary | ICD-10-CM

## 2021-07-28 NOTE — Therapy (Signed)
[place] [place], [place], [address] Phone: [phone]   Fax:  [phone]  Physical Therapy Treatment  Patient Details  Name: Judy Gutierrez MRN: [id number] Date of Birth: 03/02/1961 Referring Provider (PT): Kirsteins   Encounter Date: 07/28/2021   PT End of Session - 07/28/21 1015     Visit Number 21    Date for PT Re-Evaluation 08/05/21    PT Start Time 0930    PT Stop Time 5366    PT Time Calculation (min) 44 min    Equipment Utilized During Treatment Gait belt    Activity Tolerance Patient tolerated treatment well    Behavior During Therapy WFL for tasks assessed/performed             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)    Stroke ([place])    Unspecified essential hypertension     Past Surgical History:  Procedure Laterality Date   BUBBLE STUDY  03/05/2020   Procedure: BUBBLE STUDY;  Surgeon: Donato Heinz, MD;  Location: [place];  Service: Cardiovascular;;   CESAREAN SECTION  1992   IR ANGIO INTRA EXTRACRAN SEL COM CAROTID INNOMINATE BILAT MOD SED  05/11/2021   IR ANGIO VERTEBRAL SEL SUBCLAVIAN INNOMINATE BILAT MOD SED  05/11/2021   LOOP RECORDER INSERTION N/A 03/05/2020   Procedure: LOOP RECORDER INSERTION;  Surgeon: Deboraha Sprang, MD;  Location: [place] CV LAB;  Service: Cardiovascular;  Laterality: N/A;   TEE WITHOUT CARDIOVERSION N/A 03/05/2020   Procedure: TRANSESOPHAGEAL ECHOCARDIOGRAM (TEE);  Surgeon: Donato Heinz, MD;  Location: [facility] ENDOSCOPY;  Service: Cardiovascular;  Laterality: N/A;    There were no vitals filed for this visit.   Subjective Assessment - 07/28/21 0935     Subjective Patient reports she is continuing to perform HEP with no issues.    Patient is accompained by: Family member    Patient  Stated Goals improve balance, strength.    Currently in Pain? No/denies                               [facility] Adult PT Treatment/Exercise - 07/28/21 0001       Ambulation/Gait   Gait velocity Walked with quick steps x 100', no AD, SBA. She demosntrated improved control and coordination of RLE, with imporved swing and stance time noted. RLE slowed as she fatigued.    Gait velocity - backwards Backwards stepping x 15' with quick steps, CGA.      High Level Balance   High Level Balance Comments Performed standing crossovers and back, alternating legs. Initially unable to fully cross either foot over the other and weight shift onto it, but imporved iwth repetion. Then attempted to speed up movement. CGA, mild unsteadiness. 20 reps each way.      Lumbar Exercises: Aerobic   Recumbent Bike warmup at 1.0 x 5 minutes, then 5 x 30 seconds as fast as possible at level 1.0.      Lumbar Exercises: Seated   Other Seated Lumbar Exercises Russian Twists on dyna dic with yellow therapy ball, 2 x 20      Lumbar Exercises: Sidelying   Other Sidelying Lumbar Exercises Side planks 1 x 10 each side      Lumbar Exercises: Prone  Other Prone Lumbar Exercises Prone planks, 4 x 5 seconds. VC and TC to maintain straight trunk and activate abdominals.      Lumbar Exercises: Quadruped   Opposite Arm/Leg Raise Right arm/Left leg;Left arm/Right leg;5 reps;4 seconds    Other Quadruped Lumbar Exercises Single arm rows with 3 # in quadruped, 1 x 20 each arm.      Knee/Hip Exercises: Plyometrics   Bilateral Jumping 1 set;5 reps    Other Plyometric Exercises Jump and move feet out wide, then jump, return to center. 10 reps each way. CGA. Much difficulty moving RLE at the appropriate speed, but improved iwth focus.      Knee/Hip Exercises: Standing   Forward Step Up Both;1 set;10 reps    Forward Step Up Limitations Drive opposite knee into hip flexion.                     PT  Education - 07/28/21 1014     Education Details HEP reviewed, explanation of rationale for quick movments ant ballistic movements.    Person(s) Educated Patient    Methods Explanation;Demonstration    Comprehension Verbalized understanding              PT Short Term Goals - 07/28/21 0936       PT SHORT TERM GOAL #2   Title I with updated HEP focusing on balance.    Baseline Added exercises for balance, speed of movement.    Time 2    Period Weeks    Status New    Target Date 08/11/21               PT Long Term Goals - 07/28/21 0937       PT LONG TERM GOAL #1   Title Independent with advanced HEP    Baseline HEP evolving as patient progresses.    Time 12    Period Weeks    Status Partially Met    Target Date 08/05/21      PT LONG TERM GOAL #4   Title Improved BUE and BLE strength to at least 4+/5                   Plan - 07/28/21 1120     Clinical Impression Statement Patient continues to perform HEP. Treatment emphasized speed of movement, trunk strength and control, balance, functional coordination. After treatment activities, patient demonstrated improved coordination during gait.    Stability/Clinical Decision Making Evolving/Moderate complexity    Clinical Decision Making Moderate    Rehab Potential Good    PT Frequency 2x / week    PT Duration 12 weeks    PT Treatment/Interventions ADLs/Self Care Home Management;Cryotherapy;Electrical Stimulation;Iontophoresis 88m/ml Dexamethasone;Moist Heat;Functional mobility training;Therapeutic activities;Therapeutic exercise;Balance training;Neuromuscular re-education;Patient/family education;Manual techniques;Energy conservation;Vestibular;Canalith Repostioning;Gait training;Stair training    PT Next Visit Plan Progress speed of movement, functoinal mobility             Patient will benefit from skilled therapeutic intervention in order to improve the following deficits and impairments:  Abnormal  gait, Decreased range of motion, Difficulty walking, Impaired UE functional use, Increased muscle spasms, Decreased endurance, Pain, Improper body mechanics, Decreased balance, Decreased strength, Decreased mobility  Visit Diagnosis: Other lack of coordination  History of CVA (cerebrovascular accident)  Muscle weakness (generalized)  Other symptoms and signs involving the nervous system  Unsteadiness on feet  Ataxic gait     Problem List Patient Active Problem List   Diagnosis Date Noted   Elevated  liver enzymes 06/28/2021   Iron deficiency anemia 06/10/2021   TIA (transient ischemic attack) 05/09/2021   Chronic diastolic heart failure (grade 1 DD) 05/09/2021   Primary hypertension    Obstructive hydrocephalus (HCC)    Tachycardia    Acute cerebral infarction [facility])    Visual disturbance as complication of stroke    Transaminitis    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 coli UTI 11/01/2013   Anemia due to blood loss, acute 10/24/2013   Pelvic fracture ([place]) 10/23/2013   Hyperglycemia 09/18/2013   Routine general medical examination at a health care facility 09/18/2013   Elevated alkaline phosphatase level 09/18/2013   Hot flashes, menopausal 04/10/2013   Pain in joint, upper arm 04/10/2013    Marcelina Morel, DPT 07/28/2021, 11:24 AM  [place]. [place], [place], [address] Phone: [phone]    Fax:  [phone]  Name: Judy Gutierrez MRN: [id number] Date of Birth: [birth year]

## 2021-07-29 LAB — CUP PACEART REMOTE DEVICE CHECK
Date Time Interrogation Session: 20221013081729
Implantable Pulse Generator Implant Date: 20210520

## 2021-08-02 ENCOUNTER — Encounter: Payer: Self-pay | Admitting: Physical Therapy

## 2021-08-02 ENCOUNTER — Ambulatory Visit: Payer: No Typology Code available for payment source | Admitting: Occupational Therapy

## 2021-08-02 ENCOUNTER — Other Ambulatory Visit: Payer: Self-pay

## 2021-08-02 ENCOUNTER — Ambulatory Visit: Payer: No Typology Code available for payment source | Admitting: Physical Therapy

## 2021-08-02 ENCOUNTER — Encounter: Payer: Self-pay | Admitting: Occupational Therapy

## 2021-08-02 DIAGNOSIS — R260 Ataxic gait: Secondary | ICD-10-CM

## 2021-08-02 DIAGNOSIS — R2681 Unsteadiness on feet: Secondary | ICD-10-CM

## 2021-08-02 DIAGNOSIS — R29818 Other symptoms and signs involving the nervous system: Secondary | ICD-10-CM

## 2021-08-02 DIAGNOSIS — M6281 Muscle weakness (generalized): Secondary | ICD-10-CM

## 2021-08-02 DIAGNOSIS — R278 Other lack of coordination: Secondary | ICD-10-CM

## 2021-08-02 DIAGNOSIS — Z8673 Personal history of transient ischemic attack (TIA), and cerebral infarction without residual deficits: Secondary | ICD-10-CM

## 2021-08-02 NOTE — Therapy (Signed)
[facility] [address] [place], [place], [address] Phone: [phone]   Fax:  [phone]  Occupational Therapy Treatment  Patient Details  Name: Judy Gutierrez MRN: [id number] Date of Birth: [birth year] Referring Provider (OT): Claudette Laws, MD   Encounter Date: 08/02/2021   OT End of Session - 08/02/21 0847     Visit Number 11    Number of Visits 13   Initial 9 + 4 additional   Date for OT Re-Evaluation 08/25/21    Authorization Type [employer] plan does not cover OT/ST)    OT Start Time 0845    OT Stop Time 0929    OT Time Calculation (min) 44 min    Activity Tolerance Patient tolerated treatment well    Behavior During Therapy WFL for tasks assessed/performed            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)    Stroke (HCC)    Unspecified essential hypertension     Past Surgical History:  Procedure Laterality Date   BUBBLE STUDY  03/05/2020   Procedure: BUBBLE STUDY;  Surgeon: [name], MD;  Location: [facility] ENDOSCOPY;  Service: Cardiovascular;;   CESAREAN SECTION  1992   IR ANGIO INTRA EXTRACRAN SEL COM CAROTID INNOMINATE BILAT MOD SED  05/11/2021   IR ANGIO VERTEBRAL SEL SUBCLAVIAN INNOMINATE BILAT MOD SED  05/11/2021   LOOP RECORDER INSERTION N/A 03/05/2020   Procedure: LOOP RECORDER INSERTION;  Surgeon: [name], MD;  Location: [facility] INVASIVE CV LAB;  Service: Cardiovascular;  Laterality: N/A;   TEE WITHOUT CARDIOVERSION N/A 03/05/2020   Procedure: TRANSESOPHAGEAL ECHOCARDIOGRAM (TEE);  Surgeon: [name], MD;  Location: [facility] ENDOSCOPY;  Service: Cardiovascular;  Laterality: N/A;    There were no vitals filed for this visit.   Subjective Assessment - 08/02/21 0846     Subjective  Pt stated weight shifting  exercise in quadruped was "difficult"    Patient is accompanied by: Family member   Ajay (husband)   Pertinent History R cerebellar CVA 02/22/21, TIA 05/09/21, and hx of cerebellar CVA 01/29/20; HTN, HLD    Patient Stated Goals Drive and return to cleaning/cooking    Currently in Pain? No/denies             Treatment/Exercises - 08/02/21     Trail Making Test   Pt completed TMT: Part A and B to facilitate and assess processing, alternating attention, visual perception, and self-monitoring/problem-solving. Pt completed part A in 1 min, 23 sec and part B in 4 min, 45 sec w/ multiple errors. OT incorporated 1# weight on RUE for increased feedback due to ataxia w/ OT providing min verbal cues for planning and sequencing when completing part B    Neuromuscular Reeducation Supine bent knee lower trunk rotation to each side w/ arms abducted; emphasis on controlling movement of LEs and thoracic stretch. Completed 1 set of 10 reps on each side. Handout of exercise provided.  Pt copied pattern onto pegboard w/ easy-grip pegs simultaneously with neuromotor balance and strengthening exercises including tall kneeling press ups and lateral weight shifts on large therapy ball. Tall kneeling press ups from knees completed on therapy mat 10x w/ pt placing pegs using RUE while in tall kneeling position. Lateral weight shifting on therapy ball completed w/ pt retrieving pegs on  R side and placing into pegboard on L side; required CGA and verbal cues for weight shifting using core activation opposed to reaching w/ UE. Completed additional set of weight shifting exercise w/out pegs 10x each side. Pegboard activity facilitated FM and UE control and coordination, alternating attention, hand strengthening, and visual perception; pt able to complete activity w/ no additional cues for spatial orientation.  Attempted wt shifting forward/backward in quadruped w/ pegboard, but due to difficulty w/ both supporting wt on RUE to  place pegs w/ L hand and placing pegs w/ R hand while supporting wt forward on LUE, activity was d/c and pt completed isolated wt shifting forward/back in quadruped. Handout provided for safe carryover to HEP.            OT Short Term Goals - 06/25/21 0804       OT SHORT TERM GOAL #1   Title Pt will demonstrate independence w/ HEP designed for increased BUE strength and ROM    Time 4    Period Weeks    Status Achieved    Target Date 06/18/21      OT SHORT TERM GOAL #2   Title Pt will improve R forearm supination by at least 4 degrees to improve participation in cooking tasks    Baseline R forearm supination 61* (LUE 71*)    Time 4    Period Weeks    Status Achieved   06/14/21 - 70*     OT SHORT TERM GOAL #3   Title Pt will complete 9-HPT w/ R hand in less than 1 minute to demonstrate improvement in functional FM skills w/ R, dominant UE    Baseline R hand 1 min, 6 sec; L hand 28 sec    Time 4    Period Weeks    Status Achieved   06/14/21 - 51 sec (15 sec improvement)     OT SHORT TERM GOAL #4   Title Pt will improve functional use of RUE as evidenced by completing bilateral integration task (e.g., folding clothes, cutting food) within pt-determined acceptable level of time    Time 4    Period Weeks    Status Achieved             OT Long Term Goals - 07/26/21 0920       OT LONG TERM GOAL #1   Title Pt will be independent w/ full HEP designed for increased coordination and functional use of RUE    Time 8    Period Weeks    Status Achieved   07/23/21     OT LONG TERM GOAL #2   Title Pt will complete gardening task at Mod I, using AE/compensatory strategies for safety prn    Time 4    Period Weeks    Status On-going      OT LONG TERM GOAL #3   Title Pt will report completing at least 75% of desired household cleaning activities w/ SPV prn for safety    Time 8    Period Weeks    Status Achieved   06/25/21     OT LONG TERM GOAL #4   Title Pt will be able to write  a 5-7 word sentence, using AE prn, in less than 1 min, 30 sec demonstrating increased smoothness of lines and legibility    Baseline 1 min, 43 seconds to complete 5-word sentence w/out AE    Time 4    Period Weeks    Status On-going  OT LONG TERM GOAL #5   Title To improve participation w/ self-feeding, pt will be able to scoop 5 beans from a bowl one-at-a-time w/ R, dominant UE and bring to mouth, using adaptive utensils prn    Baseline Greater than 60 secs w/ multiple drops when using spoon to scoop 5 beans from bowl and placing in can    Time 4    Period Weeks    Status On-going             Plan - 08/02/21 0847     Clinical Impression Statement Due to pt's report in prior session that she would eventually Gutierrez to return to driving, OT administered Trail Making Test: Parts A and B to loosely screen for visual scanning, attention, processing speed, and executive functioning. Pt experienced significant difficulty w/ Part B, requiring increased time and demonstrating poor planning (difficulty identifying direct route between targets) and visual scanning. While evidence does not support this as an indicator for cessation of driving without complementary assessments, it may indicate benefit of additional testing prior to returning to driving; OT dicussed this w/ pt and introduced information on formal driving evaluation. OT also focused on high level neuromuscular reed this session, w/ pt completing balance and core stability exercises while incorporating FM skills. Pt did well w/ sequencing of tasks, requiring some cues for positioning and maintaining challenge on core activation particularly during wt shifting, likely due to decreased sense of comfort when outside BOS.    OT Occupational Profile and History Detailed Assessment- Review of Records and additional review of physical, cognitive, psychosocial history related to current functional performance    Occupational performance deficits  (Please refer to evaluation for details): ADL's;IADL's;Work;Leisure    Body Structure / Function / Physical Skills ADL;ROM;UE functional use;Balance;Decreased knowledge of use of DME;FMC;Vision;Dexterity;Body mechanics;GMC;Strength;Endurance;Coordination;IADL    Rehab Potential Good    Clinical Decision Making Several treatment options, min-mod task modification necessary    Comorbidities Affecting Occupational Performance: May have comorbidities impacting occupational performance    Modification or Assistance to Complete Evaluation  Min-Moderate modification of tasks or assist with assess necessary to complete eval    OT Frequency 1x / week   Decreased frequency due to insurance coverage   OT Duration 8 weeks    OT Treatment/Interventions Self-care/ADL training;Electrical Stimulation;Therapeutic exercise;Visual/perceptual remediation/compensation;Patient/family education;Splinting;Neuromuscular education;Moist Heat;Energy conservation;Building services engineer;Therapeutic activities;Balance training;Passive range of motion;Manual Therapy;DME and/or AE instruction;Ultrasound;Cryotherapy    Plan Continue w/ core activation and wt shifting activities; gardening/potting activity    Consulted and Agree with Plan of Care Patient;Family member/caregiver    Family Member Consulted Husband (Ajay)            Patient will benefit from skilled therapeutic intervention in order to improve the following deficits and impairments:   Body Structure / Function / Physical Skills: ADL, ROM, UE functional use, Balance, Decreased knowledge of use of DME, FMC, Vision, Dexterity, Body mechanics, GMC, Strength, Endurance, Coordination, IADL   Visit Diagnosis: Other lack of coordination  Other symptoms and signs involving the nervous system  Unsteadiness on feet  Muscle weakness (generalized)   Problem List Patient Active Problem List   Diagnosis Date Noted   Elevated liver enzymes 06/28/2021   Iron  deficiency anemia 06/10/2021   TIA (transient ischemic attack) 05/09/2021   Chronic diastolic heart failure (grade 1 DD) 05/09/2021   Primary hypertension    Obstructive hydrocephalus (HCC)    Tachycardia    Acute cerebral infarction (HCC)    Visual disturbance as  complication of stroke    Transaminitis    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 coli UTI 11/01/2013   Anemia due to blood loss, acute 10/24/2013   Pelvic fracture (HCC) 10/23/2013   Hyperglycemia 09/18/2013   Routine general medical examination at a health care facility 09/18/2013   Elevated alkaline phosphatase level 09/18/2013   Hot flashes, menopausal 04/10/2013   Pain in joint, upper arm 04/10/2013    Rosie Fate, OTR/L, MSOT 08/02/2021, 8:58 AM  [facility] [address] [place], [place], [address] Phone: [phone]   Fax:  [phone]  Name: Judy Gutierrez MRN: [id number] Date of Birth: [birth year]

## 2021-08-02 NOTE — Patient Instructions (Signed)
HEP updated to include standing with L side to table. Place R foot on towel and slide it forward and back, out and in, in a semicircle, x 10 reps each, as quickly as possible, but in a controlled manner.

## 2021-08-02 NOTE — Therapy (Signed)
[facility]. [place], [place], [address] Phone: [phone]   Fax:  [phone]  Physical Therapy Treatment  Patient Details  Name: Judy Gutierrez MRN: [id number] Date of Birth: March 23, 1961 Referring Provider (PT): Kirsteins   Encounter Date: 08/02/2021   PT End of Session - 08/02/21 1022     Visit Number 22    Date for PT Re-Evaluation 08/05/21    PT Start Time 0930    PT Stop Time 1018    PT Time Calculation (min) 48 min    Equipment Utilized During Treatment Gait belt    Activity Tolerance Patient tolerated treatment well    Behavior During Therapy WFL for tasks assessed/performed             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)    Stroke ([place])    Unspecified essential hypertension     Past Surgical History:  Procedure Laterality Date   BUBBLE STUDY  03/05/2020   Procedure: BUBBLE STUDY;  Surgeon: Donato Heinz, MD;  Location: [place];  Service: Cardiovascular;;   CESAREAN SECTION  1992   IR ANGIO INTRA EXTRACRAN SEL COM CAROTID INNOMINATE BILAT MOD SED  05/11/2021   IR ANGIO VERTEBRAL SEL SUBCLAVIAN INNOMINATE BILAT MOD SED  05/11/2021   LOOP RECORDER INSERTION N/A 03/05/2020   Procedure: LOOP RECORDER INSERTION;  Surgeon: Deboraha Sprang, MD;  Location: [place] CV LAB;  Service: Cardiovascular;  Laterality: N/A;   TEE WITHOUT CARDIOVERSION N/A 03/05/2020   Procedure: TRANSESOPHAGEAL ECHOCARDIOGRAM (TEE);  Surgeon: Donato Heinz, MD;  Location: [facility] ENDOSCOPY;  Service: Cardiovascular;  Laterality: N/A;    There were no vitals filed for this visit.   Subjective Assessment - 08/02/21 0935     Subjective Patient reports she is continuing to perform HEP with no issues. she states it is not too challenging.    Patient is  accompained by: Family member    Currently in Pain? No/denies                               [facility] Adult PT Treatment/Exercise - 08/02/21 0001       Ambulation/Gait   Ambulation/Gait Yes    Ambulation/Gait Assistance 5: Supervision    Ambulation Distance (Feet) 200 Feet    Assistive device None    Gait Pattern Step-through pattern;Decreased stance time - right;Decreased step length - right;Decreased weight shift to right;Ataxic    Pre-Gait Activities Performed multip      Standardized Balance Assessment   Standardized Balance Assessment Berg Balance Test      Berg Balance Test   Sit to Stand Able to stand without using hands and stabilize independently    Standing Unsupported Able to stand safely 2 minutes    Sitting with Back Unsupported but Feet Supported on Floor or Stool Able to sit safely and securely 2 minutes    Stand to Sit Sits safely with minimal use of hands    Transfers Able to transfer safely, minor use of hands    Standing Unsupported with Eyes Closed Able to stand 10 seconds safely    Standing Ubsupported with Feet Together Able to place feet together independently and stand 1 minute safely    From Standing, Reach Forward with Outstretched  Arm Can reach forward >12 cm safely (5")    From Standing Position, Pick up Object from [place] to pick up shoe, needs supervision    From Standing Position, Turn to Look Behind Over each Shoulder Looks behind from both sides and weight shifts well    Turn 360 Degrees Able to turn 360 degrees safely but slowly    Standing Unsupported, Alternately Place Feet on Step/Stool Able to stand independently and safely and complete 8 steps in 20 seconds    Standing Unsupported, One Foot in Front Able to plae foot ahead of the other independently and hold 30 seconds    Standing on One Leg Able to lift leg independently and hold equal to or more than 3 seconds    Total Score 49      Timed Up and Go Test   TUG Normal TUG     Normal TUG (seconds) 14.55      High Level Balance   High Level Balance Comments standing with L side to table. Place R foot on towel and slide it forward and back, out and in, in a semicircle, x 10 reps each, as quickly as possible, but in a controlled manner.      Lumbar Exercises: Aerobic   Elliptical L1.0 x 3 min 2 x 20 sec fast for coordination- became very SOB.      Knee/Hip Exercises: Plyometrics   Bilateral Jumping 10 reps;2 sets    Bilateral Jumping Limitations jump wide and narrow with improved control, attempted to jump into alternating tandem stance, with much more difficulty moving RLE.    Other Plyometric Exercises performed quick steps on minitramp iwth BUe suppor tand CGA. Difficulty coordinating RLE.                     PT Education - 08/02/21 1021     Education Details Updated HEP, educated to the need to emphasize RLE coordination and speed of movment, NM re-education for timing,    Person(s) Educated Patient;Spouse    Methods Explanation;Demonstration;Handout    Comprehension Verbalized understanding;Returned demonstration              PT Short Term Goals - 08/02/21 0929       PT SHORT TERM GOAL #2   Title I with updated HEP focusing on balance.    Baseline Progressing    Time 2    Period Weeks    Status On-going    Target Date 08/11/21               PT Long Term Goals - 08/02/21 0932       PT LONG TERM GOAL #1   Title Independent with advanced HEP    Baseline HEP evolving as patient progresses.    Time 12    Period Weeks    Status Partially Met      PT LONG TERM GOAL #2   Title Berg improved to at least 48/56 to demonstrate decreased falls risk    Baseline 49/56    Time 12    Period Weeks    Status Achieved      PT LONG TERM GOAL #3   Title TUG improved to </= 10 seconds with SPC or independent, no LOB to demonstrate improved functional gait speed and decreased falls risk    Baseline 15.55 sec, no AD    Time 12     Period Weeks    Status On-going  Plan - 08/02/21 1023     Clinical Impression Statement Patient reports HEP is not challenging her. Updated to include RLE speed of movment and NM re-ed. Re-assessed BERG and TUG, with significant improvement in BERG, TUG remains impaired due to lack of RLE coordination.    Stability/Clinical Decision Making Evolving/Moderate complexity    Clinical Decision Making Moderate    Rehab Potential Good    PT Frequency 2x / week    PT Duration 12 weeks    PT Treatment/Interventions ADLs/Self Care Home Management;Cryotherapy;Electrical Stimulation;Iontophoresis 16m/ml Dexamethasone;Moist Heat;Functional mobility training;Therapeutic activities;Therapeutic exercise;Balance training;Neuromuscular re-education;Patient/family education;Manual techniques;Energy conservation;Vestibular;Canalith Repostioning;Gait training;Stair training    PT Next Visit Plan Emphasize RLE NM re-ed. [facility]   PT Home Exercise Plan see pt instructions    Consulted and Agree with Plan of Care Patient    Family Member Consulted husband             Patient will benefit from skilled therapeutic intervention in order to improve the following deficits and impairments:  Abnormal gait, Decreased range of motion, Difficulty walking, Impaired UE functional use, Increased muscle spasms, Decreased endurance, Pain, Improper body mechanics, Decreased balance, Decreased strength, Decreased mobility  Visit Diagnosis: Other lack of coordination  History of CVA (cerebrovascular accident)  Muscle weakness (generalized)  Other symptoms and signs involving the nervous system  Unsteadiness on feet  Ataxic gait     Problem List Patient Active Problem List   Diagnosis Date Noted   Elevated liver enzymes 06/28/2021   Iron deficiency anemia 06/10/2021   TIA (transient ischemic attack) 05/09/2021   Chronic diastolic heart failure (grade 1 DD) 05/09/2021    Primary hypertension    Obstructive hydrocephalus (HCC)    Tachycardia    Acute cerebral infarction ([place]    Visual disturbance as complication of stroke    Transaminitis    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 coli UTI 11/01/2013   Anemia due to blood loss, acute 10/24/2013   Pelvic fracture ([place] 10/23/2013   Hyperglycemia 09/18/2013   Routine general medical examination at a health care facility 09/18/2013   Elevated alkaline phosphatase level 09/18/2013   Hot flashes, menopausal 04/10/2013   Pain in joint, upper arm 04/10/2013    SMarcelina Morel DPT 08/02/2021, 10:27 AM  [place] [address]Phone: [phone]  Fax:  [phone] Name: SDarinda StutevilleMRN: [id number] of Birth: [birth year]

## 2021-08-04 ENCOUNTER — Encounter: Payer: Self-pay | Admitting: Physical Therapy

## 2021-08-04 ENCOUNTER — Ambulatory Visit: Payer: No Typology Code available for payment source | Admitting: Physical Therapy

## 2021-08-04 ENCOUNTER — Other Ambulatory Visit: Payer: Self-pay

## 2021-08-04 DIAGNOSIS — R29818 Other symptoms and signs involving the nervous system: Secondary | ICD-10-CM

## 2021-08-04 DIAGNOSIS — M6281 Muscle weakness (generalized): Secondary | ICD-10-CM

## 2021-08-04 DIAGNOSIS — R2681 Unsteadiness on feet: Secondary | ICD-10-CM

## 2021-08-04 DIAGNOSIS — Z8673 Personal history of transient ischemic attack (TIA), and cerebral infarction without residual deficits: Secondary | ICD-10-CM

## 2021-08-04 DIAGNOSIS — R278 Other lack of coordination: Secondary | ICD-10-CM

## 2021-08-04 DIAGNOSIS — R260 Ataxic gait: Secondary | ICD-10-CM

## 2021-08-04 NOTE — Therapy (Signed)
[facility] [place] [address] [place], [place], [address] Phone: [phone]   Fax:  [phone]  Physical Therapy Treatment  Patient Details  Name: Judy Gutierrez MRN: [id number] Date of Birth: July 29, 1961 Referring Provider (PT): Kirsteins   Encounter Date: 08/04/2021    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)    Stroke [facility])    Unspecified essential hypertension     Past Surgical History:  Procedure Laterality Date   BUBBLE STUDY  03/05/2020   Procedure: BUBBLE STUDY;  Surgeon: [name], MD;  Location: [facility] ENDOSCOPY;  Service: Cardiovascular;;   CESAREAN SECTION  1992   IR ANGIO INTRA EXTRACRAN SEL COM CAROTID INNOMINATE BILAT MOD SED  05/11/2021   IR ANGIO VERTEBRAL SEL SUBCLAVIAN INNOMINATE BILAT MOD SED  05/11/2021   LOOP RECORDER INSERTION N/A 03/05/2020   Procedure: LOOP RECORDER INSERTION;  Surgeon: [name], MD;  Location: [facility] INVASIVE CV LAB;  Service: Cardiovascular;  Laterality: N/A;   TEE WITHOUT CARDIOVERSION N/A 03/05/2020   Procedure: TRANSESOPHAGEAL ECHOCARDIOGRAM (TEE);  Surgeon: [name], MD;  Location: [facility] ENDOSCOPY;  Service: Cardiovascular;  Laterality: N/A;    There were no vitals filed for this visit.   Subjective Assessment - 08/04/21 0930     Subjective She reports the coordination exercises seem fairly easy    Patient is accompained by: Family member    Currently in Pain? No/denies                               [facility] Adult PT Treatment/Exercise - 08/04/21 0001       Lumbar Exercises: Aerobic   Recumbent Bike 1.5 x 4 minutes, then 3 x 30 seconds fast with resistance at level 2.0      Lumbar Exercises: Seated   Hip Flexion on Ball Strengthening;Both;10 reps    Hip Flexion on Ball Limitations  Initiated with lower trunk mobility, rolling ball forward and back, side to side, circles, then moved to seated march without UE support for coordinated control. Occ min A      Knee/Hip Exercises: Plyometrics   Other Plyometric Exercises Mini tramp-Perfirmed quick jumps, then quick jumps wide/narrow feet, then alternating tandem, approximately 20 seconds each, with rest breaks in between to recover her breathing. Patient with difficulty moving RLE quickly enough to maintain rhythmic jumps.      Knee/Hip Exercises: Standing   Forward Step Up Both;1 set;10 reps    Forward Step Up Limitations 6" step with Air Ex pad on top to challenge stability. Able to perform without UE support, CGA, one episode of LOB with min A to recover on R.                       PT Short Term Goals - 08/04/21 0932       PT SHORT TERM GOAL #2   Title I with updated HEP focusing on balance.    Baseline Progressing    Time 1    Period Weeks    Status On-going    Target Date 08/11/21               PT Long Term Goals - 08/04/21 0934       PT LONG TERM GOAL #1  Title Independent with advanced HEP    Baseline HEP evolving as patient progresses.    Status On-going      PT LONG TERM GOAL #3   Title TUG improved to </= 10 seconds with SPC or independent, no LOB to demonstrate improved functional gait speed and decreased falls risk    Baseline 15.55 sec, no AD    Time 6    Status On-going    Target Date 09/15/21      PT LONG TERM GOAL #4   Title Improved BUE and BLE strength to at least 4+/5    Baseline (4-)-4/5 throughout, improving    Time 6    Status On-going    Target Date 09/15/21                    Patient will benefit from skilled therapeutic intervention in order to improve the following deficits and impairments:     Visit Diagnosis: Other lack of coordination  Other symptoms and signs involving the nervous system  Unsteadiness on feet  Muscle weakness  (generalized)  History of CVA (cerebrovascular accident)  Ataxic gait     Problem List Patient Active Problem List   Diagnosis Date Noted   Elevated liver enzymes 06/28/2021   Iron deficiency anemia 06/10/2021   TIA (transient ischemic attack) 05/09/2021   Chronic diastolic heart failure (grade 1 DD) 05/09/2021   Primary hypertension    Obstructive hydrocephalus (HCC)    Tachycardia    Acute cerebral infarction (HCC)    Visual disturbance as complication of stroke    Transaminitis    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 coli UTI 11/01/2013   Anemia due to blood loss, acute 10/24/2013   Pelvic fracture (HCC) 10/23/2013   Hyperglycemia 09/18/2013   Routine general medical examination at a health care facility 09/18/2013   Elevated alkaline phosphatase level 09/18/2013   Hot flashes, menopausal 04/10/2013   Pain in joint, upper arm 04/10/2013    Iona Beard, DPT 08/04/2021, 12:07 PM  [facility] [place] [facility] [address] [place], [place], [address] Phone: [phone]   Fax:  [phone]  Name: Milana Salay MRN: [id number] Date of Birth: Jul 08, 1961

## 2021-08-04 NOTE — Progress Notes (Signed)
Carelink Summary Report / Loop Recorder 

## 2021-08-05 ENCOUNTER — Telehealth: Payer: Self-pay | Admitting: Neurology

## 2021-08-05 NOTE — Telephone Encounter (Signed)
I confirmed with Dr. [name]. She should only be on dual antiplatelets for three months (from 05/09/21) then transition to aspirin 81mg  only. I called the patient. She verbalized understanding.

## 2021-08-05 NOTE — Telephone Encounter (Signed)
Pt states she was called by [facility] about the clopidogrel (PLAVIX) 75 MG tablet, she wants to know if Dr [name] was her to continue taking this or stop after the suggested 3 month amount

## 2021-08-09 ENCOUNTER — Encounter: Payer: Self-pay | Admitting: Physical Therapy

## 2021-08-09 ENCOUNTER — Other Ambulatory Visit: Payer: Self-pay

## 2021-08-09 ENCOUNTER — Ambulatory Visit: Payer: No Typology Code available for payment source | Admitting: Physical Therapy

## 2021-08-09 ENCOUNTER — Encounter: Payer: Self-pay | Admitting: Occupational Therapy

## 2021-08-09 ENCOUNTER — Ambulatory Visit: Payer: No Typology Code available for payment source | Admitting: Occupational Therapy

## 2021-08-09 DIAGNOSIS — M6281 Muscle weakness (generalized): Secondary | ICD-10-CM

## 2021-08-09 DIAGNOSIS — R260 Ataxic gait: Secondary | ICD-10-CM

## 2021-08-09 DIAGNOSIS — R2681 Unsteadiness on feet: Secondary | ICD-10-CM

## 2021-08-09 DIAGNOSIS — Z8673 Personal history of transient ischemic attack (TIA), and cerebral infarction without residual deficits: Secondary | ICD-10-CM

## 2021-08-09 DIAGNOSIS — R278 Other lack of coordination: Secondary | ICD-10-CM

## 2021-08-09 DIAGNOSIS — R29818 Other symptoms and signs involving the nervous system: Secondary | ICD-10-CM

## 2021-08-09 NOTE — Therapy (Signed)
[facility] [place] [address] [place], [place], [address] Phone: [phone]   Fax:  [phone]  Occupational Therapy Treatment  Patient Details  Name: Judy Gutierrez MRN: [id number] Date of Birth: [birth year] Referring Provider (OT): Claudette Laws, MD   Encounter Date: 08/09/2021   OT End of Session - 08/09/21 0854     Visit Number 12    Number of Visits 13   Initial 9 + 4 additional   Date for OT Re-Evaluation 08/25/21    Authorization Type [employer] plan does not cover OT/ST)    OT Start Time [phone]    OT Stop Time [phone]    OT Time Calculation (min) 42 min    Activity Tolerance Patient tolerated treatment well    Behavior During Therapy WFL for tasks assessed/performed            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)    Stroke (HCC)    Unspecified essential hypertension     Past Surgical History:  Procedure Laterality Date   BUBBLE STUDY  03/05/2020   Procedure: BUBBLE STUDY;  Surgeon: [name], MD;  Location: [facility] ENDOSCOPY;  Service: Cardiovascular;;   CESAREAN SECTION  1992   IR ANGIO INTRA EXTRACRAN SEL COM CAROTID INNOMINATE BILAT MOD SED  05/11/2021   IR ANGIO VERTEBRAL SEL SUBCLAVIAN INNOMINATE BILAT MOD SED  05/11/2021   LOOP RECORDER INSERTION N/A 03/05/2020   Procedure: LOOP RECORDER INSERTION;  Surgeon: [name], MD;  Location: [facility] INVASIVE CV LAB;  Service: Cardiovascular;  Laterality: N/A;   TEE WITHOUT CARDIOVERSION N/A 03/05/2020   Procedure: TRANSESOPHAGEAL ECHOCARDIOGRAM (TEE);  Surgeon: [name], MD;  Location: [facility] ENDOSCOPY;  Service: Cardiovascular;  Laterality: N/A;    There were no vitals filed for this visit.   Subjective Assessment - 08/09/21 0850     Subjective  "I'm tired of the no driving."  Pt also reports she ordered the weighted utensils, but that her hand still shakes.    Patient is accompanied by: Family member   Ajay (husband)   Pertinent History R cerebellar CVA 02/22/21, TIA 05/09/21, and hx of cerebellar CVA 01/29/20; HTN, HLD    Patient Stated Goals Drive and return to cleaning/cooking    Currently in Pain? No/denies             OT Education - 08/09/21 0945     ADLs: Self-Feeding Practiced simulated self-feeding w/ weighted spoon, scooping beans from standard bowl one at a time w/ RUE and bringing to mouth before placing on a lid positioned on L side. OT provided modifications/compensatory strategies due to tremor w/ pt returning demonstration; decreased success observed w/ increased repetition.    Scapular/Shoulder Stabilization Wall clock completed w/ RUE 3 sets -- 1st set completed w/out weight/resistance, focusing on AROM; 2nd set completed holding 1 lb weight and standing on balance pad to incorporate static standing tolerance; 3rd set completed using red theraband w/ BUEs to further challenge scapular stability. Pt completed sets w/ rest breaks in between and required min tactile/verbal cues to prevention compensatory shoulder elevation/abduction, particularly during exercise using theraband    Word Search Activity w/ Functional Transitions Simple word search completed to facilitate visual scanning and eye-hand coordination/targeting; pt able to identify 4/5 words w/ Mod I, requiring verbal cues and extended time  for success identifying remaining word. Activity completed at floor level w/ pt kneeling on balance pad to simulate positioning and transitioning in/out of kneeling position during gardening tasks. Pt able to sit down on step-stool w/ RUE support, transition into kneeling, then transition back to sitting on step-stool before returning to standing w/ CGA and extended time.            OT Education - 08/09/21 0945     Education Details Reviewed and updated  HEP    Person(s) Educated Patient    Methods Explanation;Demonstration;Verbal cues;Handout;Tactile cues    Comprehension Verbalized understanding;Returned demonstration             OT Short Term Goals - 06/25/21 0804       OT SHORT TERM GOAL #1   Title Pt will demonstrate independence w/ HEP designed for increased BUE strength and ROM    Time 4    Period Weeks    Status Achieved    Target Date 06/18/21      OT SHORT TERM GOAL #2   Title Pt will improve R forearm supination by at least 4 degrees to improve participation in cooking tasks    Baseline R forearm supination 61* (LUE 71*)    Time 4    Period Weeks    Status Achieved   06/14/21 - 70*     OT SHORT TERM GOAL #3   Title Pt will complete 9-HPT w/ R hand in less than 1 minute to demonstrate improvement in functional FM skills w/ R, dominant UE    Baseline R hand 1 min, 6 sec; L hand 28 sec    Time 4    Period Weeks    Status Achieved   06/14/21 - 51 sec (15 sec improvement)     OT SHORT TERM GOAL #4   Title Pt will improve functional use of RUE as evidenced by completing bilateral integration task (e.g., folding clothes, cutting food) within pt-determined acceptable level of time    Time 4    Period Weeks    Status Achieved             OT Long Term Goals - 08/09/21 0946       OT LONG TERM GOAL #1   Title Pt will be independent w/ full HEP designed for increased coordination and functional use of RUE    Time 8    Period Weeks    Status Achieved   07/23/21     OT LONG TERM GOAL #2   Title Pt will complete gardening task at Mod I, using AE/compensatory strategies for safety prn    Time 4    Period Weeks    Status On-going      OT LONG TERM GOAL #3   Title Pt will report completing at least 75% of desired household cleaning activities w/ SPV prn for safety    Time 8    Period Weeks    Status Achieved   06/25/21     OT LONG TERM GOAL #4   Title Pt will be able to write a 5-7 word sentence, using AE prn, in  less than 1 min, 30 sec demonstrating increased smoothness of lines and legibility    Baseline 1 min, 43 seconds to complete 5-word sentence w/out AE    Time 4    Period Weeks    Status On-going      OT LONG TERM GOAL #5   Title To improve participation w/ self-feeding, pt  will be able to scoop 5 beans from a bowl one-at-a-time w/ R, dominant UE and bring to mouth, using adaptive utensils prn    Baseline Greater than 60 secs w/ multiple drops when using spoon to scoop 5 beans from bowl and placing in can    Time 4    Period Weeks    Status Achieved   08/09/21 - completed task w/out drops using weighted spoon            Plan - 08/09/21 0856     Clinical Impression Statement Pt continues to report good carryover of HEP and learned strategies. Due to this, and considering pt's insurance does not cover OT services, OT discussed potential options of d/c next session or continuing w/ decreased frequency/week; pt was receptive and will discuss further next session. OT continued to facilitate [facility] and coordination, including education on adaptive strategies/equipment prn and providing additional options for RUE stabilization exercises w/ corresponding handout administered. Also considering pt's gardening goal, OT completed activity to facilitate completion of gardening tasks, focusing on transition from standing <> the ground, due to persistent difficulties experienced w/ balance and stability. Pt able to complete transition w/ CGA and verbalized understanding of alternate methods and safety considerations.    OT Occupational Profile and History Detailed Assessment- Review of Records and additional review of physical, cognitive, psychosocial history related to current functional performance    Occupational performance deficits (Please refer to evaluation for details): ADL's;IADL's;Work;Leisure    Body Structure / Function / Physical Skills ADL;ROM;UE functional use;Balance;Decreased knowledge of use of  DME;FMC;Vision;Dexterity;Body mechanics;GMC;Strength;Endurance;Coordination;IADL    Rehab Potential Good    Clinical Decision Making Several treatment options, min-mod task modification necessary    Comorbidities Affecting Occupational Performance: May have comorbidities impacting occupational performance    Modification or Assistance to Complete Evaluation  Min-Moderate modification of tasks or assist with assess necessary to complete eval    OT Frequency 1x / week   Decreased frequency due to insurance coverage   OT Duration 8 weeks    OT Treatment/Interventions Self-care/ADL training;Electrical Stimulation;Therapeutic exercise;Visual/perceptual remediation/compensation;Patient/family education;Splinting;Neuromuscular education;Moist Heat;Energy conservation;Building services engineer;Therapeutic activities;Balance training;Passive range of motion;Manual Therapy;DME and/or AE instruction;Ultrasound;Cryotherapy    Plan Potential d/c; handout for core activation/wt shifting exercise; re-assess LTGs 2 and 4    Consulted and Agree with Plan of Care Patient;Family member/caregiver    Family Member Consulted Husband Advertising account planner)            Patient will benefit from skilled therapeutic intervention in order to improve the following deficits and impairments:   Body Structure / Function / Physical Skills: ADL, ROM, UE functional use, Balance, Decreased knowledge of use of DME, FMC, Vision, Dexterity, Body mechanics, GMC, Strength, Endurance, Coordination, IADL   Visit Diagnosis: Other lack of coordination  Unsteadiness on feet  Other symptoms and signs involving the nervous system  Muscle weakness (generalized)   Problem List Patient Active Problem List   Diagnosis Date Noted   Elevated liver enzymes 06/28/2021   Iron deficiency anemia 06/10/2021   TIA (transient ischemic attack) 05/09/2021   Chronic diastolic heart failure (grade 1 DD) 05/09/2021   Primary hypertension    Obstructive  hydrocephalus (HCC)    Tachycardia    Acute cerebral infarction [facility])    Visual disturbance as complication of stroke    Transaminitis    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 coli UTI 11/01/2013   Anemia due to blood loss, acute 10/24/2013   Pelvic fracture (HCC) 10/23/2013   Hyperglycemia 09/18/2013   Routine general medical examination at a health care facility 09/18/2013   Elevated alkaline phosphatase level 09/18/2013   Hot flashes, menopausal 04/10/2013   Pain in joint, upper arm 04/10/2013    Rosie Fate, OTR/L, MSOT  08/09/2021, 9:48 AM  [facility] [place] [facility] [address] [place], [place], [address] Phone: [phone]   Fax:  [phone]  Name: Judy Gutierrez MRN: [id number] Date of Birth: 08-27-1961

## 2021-08-09 NOTE — Patient Instructions (Signed)
Access Code: [email or web address] URL: [email or web address] Date: 08/09/2021 Prepared by: Oley Balm  Exercises Running In Place with Pelvic Floor Contractions - 1 x daily - 7 x weekly - 3 sets - 30 reps Jumping Jacks - 1 x daily - 7 x weekly - 3 sets - 30 reps

## 2021-08-09 NOTE — Therapy (Signed)
[place] [place], [place], [address] Phone: [phone]   Fax:  [phone]  Physical Therapy Treatment  Patient Details  Name: Judy Gutierrez MRN: [id number] Date of Birth: 15-Apr-1961 Referring Provider (PT): Kirsteins   Encounter Date: 08/09/2021   PT End of Session - 08/09/21 1056     Visit Number 24    Date for PT Re-Evaluation 09/15/21    PT Start Time 0928    PT Stop Time 1014    PT Time Calculation (min) 46 min    Equipment Utilized During Treatment Gait belt    Activity Tolerance Patient tolerated treatment well    Behavior During Therapy WFL for tasks assessed/performed             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)    Stroke ([place])    Unspecified essential hypertension     Past Surgical History:  Procedure Laterality Date   BUBBLE STUDY  03/05/2020   Procedure: BUBBLE STUDY;  Surgeon: Donato Heinz, MD;  Location: [facility];  Service: Cardiovascular;;   CESAREAN SECTION  1992   IR ANGIO INTRA EXTRACRAN SEL COM CAROTID INNOMINATE BILAT MOD SED  05/11/2021   IR ANGIO VERTEBRAL SEL SUBCLAVIAN INNOMINATE BILAT MOD SED  05/11/2021   LOOP RECORDER INSERTION N/A 03/05/2020   Procedure: LOOP RECORDER INSERTION;  Surgeon: Deboraha Sprang, MD;  Location: [facility];  Service: Cardiovascular;  Laterality: N/A;   TEE WITHOUT CARDIOVERSION N/A 03/05/2020   Procedure: TRANSESOPHAGEAL ECHOCARDIOGRAM (TEE);  Surgeon: Donato Heinz, MD;  Location: [facility] ENDOSCOPY;  Service: Cardiovascular;  Laterality: N/A;    There were no vitals filed for this visit.   Subjective Assessment - 08/09/21 0933     Subjective She reports no issues. Continues to perform HEP.    Patient Stated Goals improve balance, strength.    Currently in Pain?  No/denies                               [facility] Adult PT Treatment/Exercise - 08/09/21 0001       Ambulation/Gait   Pre-Gait Activities Ambualted forward, backwards and into B side stepping, increased speed with decreased coordination in RLE.      High Level Balance   High Level Balance Comments standing on one leg, up to 7 seconds on R, so added mini squats, able to complete 3 wihtout UE support. 4 square stepping in clockwise and counterclockwise direction with 1# weigh on RLe to increase sensory feedback. She then moved to jumping L/R over a line on floor. Difficult to coordinate RLE, unable to shuffle to R/L.      Lumbar Exercises: Aerobic   Recumbent Bike 1.3 x 2 minutes, then 3 x 30 sec at hig RPM      Knee/Hip Exercises: Standing   Other Standing Knee Exercises speed of movement on the mini tramp, running 2 x 30 seconds, jumpwith abd/add x 30 seconds. Improving coordination of RLE.                     PT Education - 08/09/21 1055     Education Details HEP updated, continue to emphasize speed of movement in RLE as well as NM control.  Person(s) Educated Patient    Methods Explanation;Demonstration;Handout    Comprehension Verbalized understanding;Returned demonstration              PT Short Term Goals - 08/09/21 0937       PT SHORT TERM GOAL #2   Title I with updated HEP focusing on balance.    Baseline met    Status Achieved      PT SHORT TERM GOAL #3   Title I with newly updated HEP    Baseline activities added    Time 2    Period Weeks    Status New    Target Date 08/23/21               PT Long Term Goals - 08/09/21 0938       PT LONG TERM GOAL #1   Title Independent with advanced HEP    Time 10    Period Weeks    Status On-going    Target Date 10/04/21                   Plan - 08/09/21 1057     Clinical Impression Statement She continues to perform HEP. Patient is progressing iwth RLE control and  coordination, but is still not able to move with adequate speed during dynamic activities.    Stability/Clinical Decision Making Evolving/Moderate complexity    Clinical Decision Making Moderate    Rehab Potential Good    PT Frequency 2x / week    PT Duration --   10w   PT Treatment/Interventions ADLs/Self Care Home Management;Cryotherapy;Electrical Stimulation;Iontophoresis 36m/ml Dexamethasone;Moist Heat;Functional mobility training;Therapeutic activities;Therapeutic exercise;Balance training;Neuromuscular re-education;Patient/family education;Manual techniques;Energy conservation;Vestibular;Canalith Repostioning;Gait training;Stair training    PT Next Visit Plan Emphasize RLE NM re-ed. [facility]   PT Home Exercise Plan see pt instructions    Consulted and Agree with Plan of Care Patient             Patient will benefit from skilled therapeutic intervention in order to improve the following deficits and impairments:  Abnormal gait, Decreased range of motion, Difficulty walking, Impaired UE functional use, Increased muscle spasms, Decreased endurance, Pain, Improper body mechanics, Decreased balance, Decreased strength, Decreased mobility  Visit Diagnosis: Other lack of coordination  Other symptoms and signs involving the nervous system  Unsteadiness on feet  Muscle weakness (generalized)  History of CVA (cerebrovascular accident)  Ataxic gait     Problem List Patient Active Problem List   Diagnosis Date Noted   Elevated liver enzymes 06/28/2021   Iron deficiency anemia 06/10/2021   TIA (transient ischemic attack) 05/09/2021   Chronic diastolic heart failure (grade 1 DD) 05/09/2021   Primary hypertension    Obstructive hydrocephalus (HCC)    Tachycardia    [place]    Visual disturbance as complication of stroke    Transaminitis    Embolic stroke involving right cerebellar artery ([place] 02/26/2021   Acute blood loss anemia     History of CVA (cerebrovascular accident)    Benign essential HTN    Prediabetes    Hyponatremia    Acute CVA (cerebrovascular accident) ([place] 02/23/2021   Hyperlipidemia 06/01/2020   Cryptogenic stroke ([place] 02/25/2020   Essential hypertension 02/05/2020   Posterior circulation stroke ([place] 01/30/2020   Cerebellar stroke, acute ([place] 01/30/2020   Musculoskeletal chest pain 09/10/2014   Hand pain 02/12/2014   Fracture of left ulna 11/05/2013   E. coli UTI 11/01/2013   Anemia due to blood loss,  acute 10/24/2013   Pelvic fracture (HCC) 10/23/2013   Hyperglycemia 09/18/2013   Routine general medical examination at a health care facility 09/18/2013   Elevated alkaline phosphatase level 09/18/2013   Hot flashes, menopausal 04/10/2013   Pain in joint, upper arm 04/10/2013    Marcelina Morel, DPT 08/09/2021, 11:00 AM  [address] [place], [place], [address] Phone: [phone]   Fax:  [phone]  Name: Darielle Hancher MRN: [id number] Date of Birth: Apr 15, 1961

## 2021-08-11 ENCOUNTER — Ambulatory Visit: Payer: No Typology Code available for payment source | Admitting: Physical Therapy

## 2021-08-11 ENCOUNTER — Encounter: Payer: Self-pay | Admitting: Physical Therapy

## 2021-08-11 ENCOUNTER — Other Ambulatory Visit: Payer: Self-pay

## 2021-08-11 DIAGNOSIS — R29818 Other symptoms and signs involving the nervous system: Secondary | ICD-10-CM

## 2021-08-11 DIAGNOSIS — Z8673 Personal history of transient ischemic attack (TIA), and cerebral infarction without residual deficits: Secondary | ICD-10-CM

## 2021-08-11 DIAGNOSIS — R260 Ataxic gait: Secondary | ICD-10-CM

## 2021-08-11 DIAGNOSIS — M6281 Muscle weakness (generalized): Secondary | ICD-10-CM | POA: Diagnosis not present

## 2021-08-11 DIAGNOSIS — R2681 Unsteadiness on feet: Secondary | ICD-10-CM

## 2021-08-11 DIAGNOSIS — R278 Other lack of coordination: Secondary | ICD-10-CM

## 2021-08-11 NOTE — Therapy (Signed)
[place]. [place], [place], [address] Phone: [phone]   Fax:  [phone]  Physical Therapy Treatment  Patient Details  Name: Judy Gutierrez MRN: [id number] Date of Birth: 09-Jan-1961 Referring Provider (PT): Kirsteins   Encounter Date: 08/11/2021   PT End of Session - 08/11/21 1245     Visit Number 25    Date for PT Re-Evaluation 09/15/21    PT Start Time 0936    PT Stop Time 1014    PT Time Calculation (min) 38 min    Equipment Utilized During Treatment Gait belt    Activity Tolerance Patient tolerated treatment well    Behavior During Therapy WFL for tasks assessed/performed             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)    Stroke ([place])    Unspecified essential hypertension     Past Surgical History:  Procedure Laterality Date   BUBBLE STUDY  03/05/2020   Procedure: BUBBLE STUDY;  Surgeon: Donato Heinz, MD;  Location: [facility];  Service: Cardiovascular;;   CESAREAN SECTION  1992   IR ANGIO INTRA EXTRACRAN SEL COM CAROTID INNOMINATE BILAT MOD SED  05/11/2021   IR ANGIO VERTEBRAL SEL SUBCLAVIAN INNOMINATE BILAT MOD SED  05/11/2021   LOOP RECORDER INSERTION N/A 03/05/2020   Procedure: LOOP RECORDER INSERTION;  Surgeon: Deboraha Sprang, MD;  Location: [place] [facility];  Service: Cardiovascular;  Laterality: N/A;   TEE WITHOUT CARDIOVERSION N/A 03/05/2020   Procedure: TRANSESOPHAGEAL ECHOCARDIOGRAM (TEE);  Surgeon: Donato Heinz, MD;  Location: [facility] ENDOSCOPY;  Service: Cardiovascular;  Laterality: N/A;    There were no vitals filed for this visit.   Subjective Assessment - 08/11/21 0942     Subjective No pain, continuing iwth HEP. Planned trip to [place] this week.    Patient Stated Goals improve balance, strength.     Currently in Pain? No/denies                               [facility] Adult PT Treatment/Exercise - 08/11/21 0001       Ambulation/Gait   Ambulation/Gait Yes    Ambulation/Gait Assistance 7: Independent    Ambulation Distance (Feet) 400 Feet    Assistive device None    Gait Pattern Step-through pattern;Decreased stance time - right;Decreased stride length;Ataxic    Ambulation Surface Level;Unlevel;Indoor;Outdoor;Paved    Gait Comments Improved coordination, speed, but still demonstrates lack of coordination in RLE.      Lumbar Exercises: Aerobic   Recumbent Bike 1.8 x 4 minutes, then 3 x 30 seconds @ 1.5      Knee/Hip Exercises: Plyometrics   Other Plyometric Exercises Mini tramp-Perfirmed quick jumps, then quick jumps wide/narrow feet, then alternating tandem, approximately 20 seconds each, with rest breaks in between to recover her breathing. Patient with improved control and speed of movement in RLE.      Knee/Hip Exercises: Standing   Forward Step Up Right;Left;1 set;10 reps    Forward Step Up Limitations Step up onto upside down BOSU, drive opposite knee up in runners pose.    Rocker Board --   Energy manager Limitations Step ups onto BOSU with each limb. standing x 20 seconds with minimal UE support.  Other Standing Knee Exercises Attempted shuffling, patient unable to perform the "skip" portion.                     PT Education - 08/11/21 1015     Education Details Patient reports continued performance of HEP. Did not modify today as she is going on a short vacation.    Person(s) Educated Patient    Methods Explanation    Comprehension Verbalized understanding              PT Short Term Goals - 08/09/21 0937       PT SHORT TERM GOAL #2   Title I with updated HEP focusing on balance.    Baseline met    Status Achieved      PT SHORT TERM GOAL #3   Title I with newly updated HEP    Baseline activities added    Time 2     Period Weeks    Status New    Target Date 08/23/21               PT Long Term Goals - 08/09/21 0938       PT LONG TERM GOAL #1   Title Independent with advanced HEP    Time 10    Period Weeks    Status On-going    Target Date 10/04/21                   Plan - 08/11/21 1246     Clinical Impression Statement Progressing iwth speed and coordination in RLE. She is performing HEP, which may need to be modified on next visit in order to continue to challenge her.    Stability/Clinical Decision Making Evolving/Moderate complexity    Clinical Decision Making Moderate    Rehab Potential Good    PT Frequency 2x / week    PT Duration Other (comment)   5w   PT Treatment/Interventions ADLs/Self Care Home Management;Cryotherapy;Electrical Stimulation;Iontophoresis 57m/ml Dexamethasone;Moist Heat;Functional mobility training;Therapeutic activities;Therapeutic exercise;Balance training;Neuromuscular re-education;Patient/family education;Manual techniques;Energy conservation;Vestibular;Canalith Repostioning;Gait training;Stair training    PT Next Visit Plan Re-assess HEP, update as appropriate. Continue coordinatoin and cotnrol of RLE.    Consulted and Agree with Plan of Care Patient             Patient will benefit from skilled therapeutic intervention in order to improve the following deficits and impairments:  Abnormal gait, Decreased range of motion, Difficulty walking, Impaired UE functional use, Increased muscle spasms, Decreased endurance, Pain, Improper body mechanics, Decreased balance, Decreased strength, Decreased mobility  Visit Diagnosis: Other lack of coordination  Unsteadiness on feet  Muscle weakness (generalized)  Other symptoms and signs involving the nervous system  History of CVA (cerebrovascular accident)  Ataxic gait     Problem List Patient Active Problem List   Diagnosis Date Noted   Elevated liver enzymes 06/28/2021   Iron deficiency  anemia 06/10/2021   TIA (transient ischemic attack) 05/09/2021   Chronic diastolic heart failure (grade 1 DD) 05/09/2021   Primary hypertension    Obstructive hydrocephalus (HCC)    Tachycardia    Acute cerebral infarction ([facility]    Visual disturbance as complication of stroke    Transaminitis    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 coli UTI 11/01/2013   Anemia due to blood loss, acute 10/24/2013   Pelvic fracture (HCC) 10/23/2013   Hyperglycemia 09/18/2013   Routine general medical examination at a health care facility 09/18/2013   Elevated alkaline phosphatase level 09/18/2013   Hot flashes, menopausal 04/10/2013   Pain in joint, upper arm 04/10/2013    Marcelina Morel, DPT 08/11/2021, 12:52 PM  [place]. [place], [place], [address] Phone: [phone]   Fax:  [phone]  Name: Treasure Ochs MRN: [id number] Date of Birth: 07/19/1961

## 2021-08-12 ENCOUNTER — Ambulatory Visit: Payer: No Typology Code available for payment source | Admitting: Physical Therapy

## 2021-08-16 ENCOUNTER — Ambulatory Visit: Payer: No Typology Code available for payment source | Admitting: Occupational Therapy

## 2021-08-16 ENCOUNTER — Ambulatory Visit: Payer: No Typology Code available for payment source | Admitting: Physical Therapy

## 2021-08-16 ENCOUNTER — Encounter: Payer: Self-pay | Admitting: Physical Therapy

## 2021-08-16 ENCOUNTER — Other Ambulatory Visit: Payer: Self-pay

## 2021-08-16 ENCOUNTER — Ambulatory Visit
Admission: RE | Admit: 2021-08-16 | Discharge: 2021-08-16 | Disposition: A | Discharge status: Home / Self Care | Payer: No Typology Code available for payment source | Source: Ambulatory Visit | Attending: Family Medicine | Admitting: Family Medicine

## 2021-08-16 DIAGNOSIS — R278 Other lack of coordination: Secondary | ICD-10-CM

## 2021-08-16 DIAGNOSIS — M6281 Muscle weakness (generalized): Secondary | ICD-10-CM

## 2021-08-16 DIAGNOSIS — R29818 Other symptoms and signs involving the nervous system: Secondary | ICD-10-CM

## 2021-08-16 DIAGNOSIS — R260 Ataxic gait: Secondary | ICD-10-CM

## 2021-08-16 DIAGNOSIS — R2681 Unsteadiness on feet: Secondary | ICD-10-CM

## 2021-08-16 DIAGNOSIS — I10 Essential (primary) hypertension: Secondary | ICD-10-CM

## 2021-08-16 DIAGNOSIS — Z8673 Personal history of transient ischemic attack (TIA), and cerebral infarction without residual deficits: Secondary | ICD-10-CM

## 2021-08-16 DIAGNOSIS — Z1231 Encounter for screening mammogram for malignant neoplasm of breast: Secondary | ICD-10-CM

## 2021-08-16 NOTE — Therapy (Signed)
[facility]. [place], [place], [address] Phone: [phone]   Fax:  [phone]  Occupational Therapy Treatment & Recertification  Patient Details  Name: Judy Gutierrez MRN: [id number] Date of Birth: 1960-12-25 Referring Provider (OT): Alysia Penna, MD   Encounter Date: 08/16/2021   OT End of Session - 08/16/21 0902     Visit Number 13    Number of Visits 15   Initial 13 + 2 additional   Date for OT Re-Evaluation 09/15/21    Authorization Type [employer] plan does not cover OT/ST)    OT Start Time 0848    OT Stop Time 0930    OT Time Calculation (min) 42 min    Activity Tolerance Patient tolerated treatment well    Behavior During Therapy WFL for tasks assessed/performed            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)    Stroke ([place])    Unspecified essential hypertension     Past Surgical History:  Procedure Laterality Date   BUBBLE STUDY  03/05/2020   Procedure: BUBBLE STUDY;  Surgeon: Donato Heinz, MD;  Location: [facility];  Service: Cardiovascular;;   CESAREAN SECTION  1992   IR ANGIO INTRA EXTRACRAN SEL COM CAROTID INNOMINATE BILAT MOD SED  05/11/2021   IR ANGIO VERTEBRAL SEL SUBCLAVIAN INNOMINATE BILAT MOD SED  05/11/2021   LOOP RECORDER INSERTION N/A 03/05/2020   Procedure: LOOP RECORDER INSERTION;  Surgeon: Deboraha Sprang, MD;  Location: [facility];  Service: Cardiovascular;  Laterality: N/A;   TEE WITHOUT CARDIOVERSION N/A 03/05/2020   Procedure: TRANSESOPHAGEAL ECHOCARDIOGRAM (TEE);  Surgeon: Donato Heinz, MD;  Location: [facility] ENDOSCOPY;  Service: Cardiovascular;  Laterality: N/A;    There were no vitals filed for this visit.   Subjective Assessment - 08/16/21 0856     Subjective  Pt reports  she has been completing her exercises (PT and OT) very consistently at home and that they are returning weighted utensils because they are too heavy    Patient is accompanied by: Family member   Ajay (husband)   Pertinent History R cerebellar CVA 02/22/21, TIA 05/09/21, and hx of cerebellar CVA 01/29/20; HTN, HLD    Patient [place] and return to cleaning/cooking    Currently in Pain? No/denies             [facility] OT Assessment - 08/16/21 0904       Hand Function   Right Hand Grip (lbs) 39 lbs    Left Hand Grip (lbs) 34 lbs             Treatment/Exercises - 08/16/21 0950     Coordination Activities Pt completed coordination activities w/ RUE and BUEs as appropriate, including: rotating small ball w/ fingertips; tossing ball from one hand to the other; tossing/catching ball w/ ipsilateral UE/both hands; rotating 2 small balls around each other in-hand; flipping cards off a deck as quickly as possible; rotating cards in-hand/turning cards end-over end; and pushing cards off deck using thumb. Pt required verbal cues and modeling for ball toss and catch exercises, demonstrating multiple drops and decreased coordination, but was able to complete remaining exercises w/ min difficulty/drops. Success w/ coordination tasks consistently improved w/ repetition.  Pt also completed Box and Blocks activity to  facilitate functional grasp and release, GMC, and coordination; able to complete 53 blocks w/ LUE and 35 blocks w/ RUE.            OT Education - 08/16/21 0950     Education Details Continued condition-specific education, including functional recovery patterns, and discussed POC moving forward. Updated coordination HEP w/ corresponding handout provided (see pt instructions)    Person(s) Educated Patient    Methods Explanation;Demonstration;Verbal cues;Handout    Comprehension Verbalized understanding;Returned demonstration             OT Short Term Goals - 06/25/21 0804        OT SHORT TERM GOAL #1   Title Pt will demonstrate independence w/ HEP designed for increased BUE strength and ROM    Time 4    Period Weeks    Status Achieved    Target Date 06/18/21      OT SHORT TERM GOAL #2   Title Pt will improve R forearm supination by at least 4 degrees to improve participation in cooking tasks    Baseline R forearm supination 61* (LUE 71*)    Time 4    Period Weeks    Status Achieved   06/14/21 - 70*     OT SHORT TERM GOAL #3   Title Pt will complete 9-HPT w/ R hand in less than 1 minute to demonstrate improvement in functional FM skills w/ R, dominant UE    Baseline R hand 1 min, 6 sec; L hand 28 sec    Time 4    Period Weeks    Status Achieved   06/14/21 - 51 sec (15 sec improvement)     OT SHORT TERM GOAL #4   Title Pt will improve functional use of RUE as evidenced by completing bilateral integration task (e.g., folding clothes, cutting food) within pt-determined acceptable level of time    Time 4    Period Weeks    Status Achieved             OT Long Term Goals - 08/09/21 0946       OT LONG TERM GOAL #1   Title Pt will be independent w/ full HEP designed for increased coordination and functional use of RUE    Time 8    Period Weeks    Status Achieved   07/23/21     OT LONG TERM GOAL #2   Title Pt will complete gardening task at Mod I, using AE/compensatory strategies for safety prn    Time 4    Period Weeks    Status On-going      OT LONG TERM GOAL #3   Title Pt will report completing at least 75% of desired household cleaning activities w/ SPV prn for safety    Time 8    Period Weeks    Status Achieved   06/25/21     OT LONG TERM GOAL #4   Title Pt will be able to write a 5-7 word sentence, using AE prn, in less than 1 min, 30 sec demonstrating increased smoothness of lines and legibility    Baseline 1 min, 43 seconds to complete 5-word sentence w/out AE    Time 4    Period Weeks    Status On-going      OT LONG TERM GOAL #5    Title To improve participation w/ self-feeding, pt will be able to scoop 5 beans from a bowl one-at-a-time w/ R, dominant UE and bring to mouth, using  adaptive utensils prn    Baseline Greater than 60 secs w/ multiple drops when using spoon to scoop 5 beans from bowl and placing in can    Time 4    Period Weeks    Status Achieved   08/09/21 - completed task w/out drops using weighted spoon            Plan - 08/16/21 1001     Clinical Impression Statement Pt is currently almost 6 months post-CVA and has demonstrated improvements in GM and FM control and coordination, visual perception, balance, and participation/safety in functional activities. OT continued to facilitate RUE coordination this session w/ pt demonstrating most difficulty w/ tossing ball between hands and tossing and catching small ball w/ RUE. Difficulty likely due to deficits w/ decreased coordination of RUE and grasp and release. However, success w/ all coordination exercises improved w/ repetition. OT also reassessed grip strength w/ pt demonstrating 11 lb improvement in R hand since evaluation session. Pt will continue to benefit from skilled occupational therapy services to further address control and coordination, condition-specific education, and participation/safety w/ functional activities. Considering pt's progress, time since onset, and reported consistency w/ HEP, OT discussed reducing frequency to 1x every other week to allow pt to continue to w/ HEP while maintaining monitoring of progress and updating HEP accordingly; pt is agreeable to plan at this time.    OT Occupational Profile and History Detailed Assessment- Review of Records and additional review of physical, cognitive, psychosocial history related to current functional performance    Occupational performance deficits (Please refer to evaluation for details): ADL's;IADL's;Work;Leisure    Body Structure / Function / Physical Skills ADL;ROM;UE functional  use;Balance;Decreased knowledge of use of DME;FMC;Vision;Dexterity;Body mechanics;GMC;Strength;Endurance;Coordination;IADL    Rehab Potential Good    Clinical Decision Making Several treatment options, min-mod task modification necessary    Comorbidities Affecting Occupational Performance: May have comorbidities impacting occupational performance    Modification or Assistance to Complete Evaluation  Min-Moderate modification of tasks or assist with assess necessary to complete eval    OT Frequency 1x / week   Decreased frequency due to insurance coverage   OT Duration 8 weeks    OT Treatment/Interventions Self-care/ADL training;Electrical Stimulation;Therapeutic exercise;Visual/perceptual remediation/compensation;Patient/family education;Splinting;Neuromuscular education;Moist Heat;Energy conservation;Therapist, nutritional;Therapeutic activities;Balance training;Passive range of motion;Manual Therapy;DME and/or AE instruction;Ultrasound;Cryotherapy    Plan Handout for core activation/wt shifting exercises (modified sit-ups w/ theraband, wt bearing w/ body on arm movements); re-assess LTG4 (handwriting)    Consulted and Agree with Plan of Care Patient;Family member/caregiver    Family Member Consulted Husband Chiropractor)            Patient will benefit from skilled therapeutic intervention in order to improve the following deficits and impairments:   Body Structure / Function / Physical Skills: ADL, ROM, UE functional use, Balance, Decreased knowledge of use of DME, FMC, Vision, Dexterity, Body mechanics, GMC, Strength, Endurance, Coordination, IADL   Visit Diagnosis: Other lack of coordination  Other symptoms and signs involving the nervous system  Muscle weakness (generalized)   Problem List Patient Active Problem List   Diagnosis Date Noted   Elevated liver enzymes 06/28/2021   Iron deficiency anemia 06/10/2021   TIA (transient ischemic attack) 05/09/2021   Chronic diastolic  heart failure (grade 1 DD) 05/09/2021   Primary hypertension    Obstructive hydrocephalus (HCC)    Tachycardia    Acute cerebral infarction ([place])    Visual disturbance as complication of stroke    Transaminitis    Embolic stroke  involving right cerebellar artery ([place]) 02/26/2021   Acute blood loss anemia    History of CVA (cerebrovascular accident)    Benign essential HTN    Prediabetes    Hyponatremia    Acute CVA (cerebrovascular accident) ([place]) 02/23/2021   Hyperlipidemia 06/01/2020   Cryptogenic stroke ([place]) 02/25/2020   Essential hypertension 02/05/2020   Posterior circulation stroke ([place]) 01/30/2020   Cerebellar stroke, acute ([place]) 01/30/2020   Musculoskeletal chest pain 09/10/2014   Hand pain 02/12/2014   Fracture of left ulna 11/05/2013   E. coli UTI 11/01/2013   Anemia due to blood loss, acute 10/24/2013   Pelvic fracture ([place]) 10/23/2013   Hyperglycemia 09/18/2013   Routine general medical examination at a health care facility 09/18/2013   Elevated alkaline phosphatase level 09/18/2013   Hot flashes, menopausal 04/10/2013   Pain in joint, upper arm 04/10/2013    Kathrine Cords, OTR/L, MSOT 08/16/2021, 5:06 PM  [facility]. [place], [place], [address] Phone: [phone]   Fax:  [phone]  Name: Gaylan Tausch MRN: [id number] Date of Birth: September 22, 1961

## 2021-08-16 NOTE — Therapy (Signed)
[facility] [address]. [place], [place], [address] Phone: [phone]   Fax:  [phone]  Physical Therapy Treatment  Patient Details  Name: Modesty Rudy MRN: [id number] Date of Birth: 1961-03-23 Referring Provider (PT): Kirsteins   Encounter Date: 08/16/2021   PT End of Session - 08/16/21 1014     Visit Number 26    Date for PT Re-Evaluation 09/15/21    PT Start Time 0932    PT Stop Time 1014    PT Time Calculation (min) 42 min    Equipment Utilized During Treatment Gait belt    Activity Tolerance Patient tolerated treatment well    Behavior During Therapy WFL for tasks assessed/performed             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)    Stroke (HCC)    Unspecified essential hypertension     Past Surgical History:  Procedure Laterality Date   BUBBLE STUDY  03/05/2020   Procedure: BUBBLE STUDY;  Surgeon: [name], MD;  Location: [facility] ENDOSCOPY;  Service: Cardiovascular;;   CESAREAN SECTION  1992   IR ANGIO INTRA EXTRACRAN SEL COM CAROTID INNOMINATE BILAT MOD SED  05/11/2021   IR ANGIO VERTEBRAL SEL SUBCLAVIAN INNOMINATE BILAT MOD SED  05/11/2021   LOOP RECORDER INSERTION N/A 03/05/2020   Procedure: LOOP RECORDER INSERTION;  Surgeon: [name], MD;  Location: [facility];  Service: Cardiovascular;  Laterality: N/A;   TEE WITHOUT CARDIOVERSION N/A 03/05/2020   Procedure: TRANSESOPHAGEAL ECHOCARDIOGRAM (TEE);  Surgeon: [name], MD;  Location: [facility] ENDOSCOPY;  Service: Cardiovascular;  Laterality: N/A;    There were no vitals filed for this visit.   Subjective Assessment - 08/16/21 0940     Subjective Continuing Hep program, coordination activities still challenging.    Patient is accompained by: Family member     Currently in Pain? No/denies                               [facility] Adult PT Treatment/Exercise - 08/16/21 0001       Ambulation/Gait   Ambulation/Gait Yes    Ambulation/Gait Assistance 7: Independent    Ambulation Distance (Feet) 400 Feet    Assistive device None    Gait Pattern Step-through pattern;Decreased stance time - right;Decreased weight shift to right    Ambulation Surface Level      Lumbar Exercises: Aerobic   Elliptical L1.0 x 3 min 3 x 20 sec fast for coordination- became very SOB.    Recumbent Bike 1.5 x 5 minutes      Knee/Hip Exercises: Plyometrics   Other Plyometric Exercises Mini tramp-Perfirmed quick jumps, then quick jumps wide/narrow feet, then alternating tandem, 3 x 30 seconds each, with rest breaks in between to recover her breathing. Patient with improved control and speed of movement in RLE. Able to achieve rhythmic jumping and maintain for approximately 15 seconds.      Knee/Hip Exercises: Standing   Rocker Board Limitations Stand on BOSU in SLS with each limb. standing x 20 seconds with minimal UE support. Then stood on BOSU wit h1# weight in hands rotating from side to side to touch paralel bars and then moving into diagonals to each side. 6 reps each with min  A for balance.    Other Standing Knee Exercises Stood with back to red therapy ball, holding it against the wall. SLS and then SLS with rotation to each side x 15 seconds. up to min A for balance on R.                     PT Education - 08/16/21 1013     Education Details Patient continuing to perform HEP. Vacation delayed until 11/7-11/11              PT Short Term Goals - 08/16/21 0930       PT SHORT TERM GOAL #3   Title I with newly updated HEP    Baseline activities added    Time 1    Period Weeks    Status On-going    Target Date 08/23/21               PT Long Term Goals - 08/16/21 0930       PT LONG TERM GOAL #1   Title Independent with  advanced HEP    Baseline HEP evolving as patient progresses.    Time 9    Period Weeks    Status On-going    Target Date 10/04/21      PT LONG TERM GOAL #3   Title TUG improved to </= 10 seconds with SPC or independent, no LOB to demonstrate improved functional gait speed and decreased falls risk    Baseline 15.55 sec, no AD    Time 5    Period Weeks    Status On-going    Target Date 09/15/21      PT LONG TERM GOAL #4   Title Improved BUE and BLE strength to at least 4+/5    Baseline (4-)-4/5 throughout, improving    Time 5    Period Weeks    Status On-going    Target Date 09/15/21                   Plan - 08/16/21 1015     Clinical Impression Statement Continues to improve with speed and coordination of RLE during challenging activities with mild gait abnormalities noted. HEP remains appropriate.    Stability/Clinical Decision Making Evolving/Moderate complexity    Rehab Potential Good    PT Frequency 2x / week    PT Duration Other (comment)   5w   PT Treatment/Interventions ADLs/Self Care Home Management;Cryotherapy;Electrical Stimulation;Iontophoresis 4mg /ml Dexamethasone;Moist Heat;Functional mobility training;Therapeutic activities;Therapeutic exercise;Balance training;Neuromuscular re-education;Patient/family education;Manual techniques;Energy conservation;Vestibular;Canalith Repostioning;Gait training;Stair training    PT Next Visit Plan Re-assess HEP, update as appropriate. Continue coordinatoin and cotnrol of RLE.    Consulted and Agree with Plan of Care Patient             Patient will benefit from skilled therapeutic intervention in order to improve the following deficits and impairments:  Abnormal gait, Decreased range of motion, Difficulty walking, Impaired UE functional use, Increased muscle spasms, Decreased endurance, Pain, Improper body mechanics, Decreased balance, Decreased strength, Decreased mobility  Visit Diagnosis: Other lack of  coordination  Unsteadiness on feet  Muscle weakness (generalized)  Other symptoms and signs involving the nervous system  Essential hypertension  History of CVA (cerebrovascular accident)  Ataxic gait     Problem List Patient Active Problem List   Diagnosis Date Noted   Elevated liver enzymes 06/28/2021   Iron deficiency anemia 06/10/2021   TIA (transient ischemic attack) 05/09/2021   Chronic diastolic heart failure (grade  1 DD) 05/09/2021   Primary hypertension    Obstructive hydrocephalus (HCC)    Tachycardia    Acute cerebral infarction [facility])    Visual disturbance as complication of stroke    Transaminitis    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 coli UTI 11/01/2013   Anemia due to blood loss, acute 10/24/2013   Pelvic fracture ([place]) 10/23/2013   Hyperglycemia 09/18/2013   Routine general medical examination at a health care facility 09/18/2013   Elevated alkaline phosphatase level 09/18/2013   Hot flashes, menopausal 04/10/2013   Pain in joint, upper arm 04/10/2013    Marcelina Morel, DPT 08/16/2021, 10:17 AM  [facility]. [place], [place], [address] Phone: [phone]   Fax:  [phone]  Name: Terin Kahrs MRN: [id number] Date of Birth: Apr 09, 1961

## 2021-08-16 NOTE — Patient Instructions (Signed)
Coordination Exercises  Perform the following exercises for 15 minutes 2 times per day. Perform with right or both hands as appropriate.  Flipping Cards: Place deck of cards on the table. Flip cards over as quickly as possible Deal cards: Hold 1/2 or whole deck in your hand. Use thumb to push cards off top of deck Rotate card between each finger or clockwise/counter-clockwise with your fingertips Rotate ball with fingertips (clockwise and counter-clockwise) Toss ball from one hand to the other Toss ball in the air and catch with the same hand Rotate 2 golf balls in your hand: Both directions Fasten nuts/bolts or put on bottle caps

## 2021-08-18 ENCOUNTER — Other Ambulatory Visit: Payer: Self-pay

## 2021-08-18 ENCOUNTER — Ambulatory Visit
Payer: No Typology Code available for payment source | Attending: Physical Medicine & Rehabilitation | Admitting: Physical Therapy

## 2021-08-18 ENCOUNTER — Encounter: Payer: Self-pay | Admitting: Physical Therapy

## 2021-08-18 DIAGNOSIS — R2681 Unsteadiness on feet: Secondary | ICD-10-CM | POA: Diagnosis present

## 2021-08-18 DIAGNOSIS — Z8673 Personal history of transient ischemic attack (TIA), and cerebral infarction without residual deficits: Secondary | ICD-10-CM | POA: Diagnosis present

## 2021-08-18 DIAGNOSIS — M6281 Muscle weakness (generalized): Secondary | ICD-10-CM | POA: Diagnosis present

## 2021-08-18 DIAGNOSIS — R278 Other lack of coordination: Secondary | ICD-10-CM | POA: Diagnosis present

## 2021-08-18 DIAGNOSIS — I10 Essential (primary) hypertension: Secondary | ICD-10-CM | POA: Diagnosis present

## 2021-08-18 DIAGNOSIS — R29818 Other symptoms and signs involving the nervous system: Secondary | ICD-10-CM | POA: Diagnosis present

## 2021-08-18 DIAGNOSIS — R260 Ataxic gait: Secondary | ICD-10-CM | POA: Diagnosis present

## 2021-08-18 NOTE — Therapy (Signed)
[place] [place], [place], [address] Phone: [phone]   Fax:  [phone]  Physical Therapy Treatment  Patient Details  Name: Judy Gutierrez MRN: [id number] Date of Birth: [birth year] Referring Provider (PT): Kirsteins   Encounter Date: 08/18/2021   PT End of Session - 08/18/21 1625     Visit Number 27    Date for PT Re-Evaluation 09/15/21    PT Start Time 1542    PT Stop Time 1626    PT Time Calculation (min) 44 min    Equipment Utilized During Treatment Gait belt    Activity Tolerance Patient tolerated treatment well    Behavior During Therapy WFL for tasks assessed/performed             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)    Stroke ([place])    Unspecified essential hypertension     Past Surgical History:  Procedure Laterality Date   BUBBLE STUDY  03/05/2020   Procedure: BUBBLE STUDY;  Surgeon: Donato Heinz, MD;  Location: [place] [facility];  Service: Cardiovascular;;   CESAREAN SECTION  1992   IR ANGIO INTRA EXTRACRAN SEL COM CAROTID INNOMINATE BILAT MOD SED  05/11/2021   IR ANGIO VERTEBRAL SEL SUBCLAVIAN INNOMINATE BILAT MOD SED  05/11/2021   LOOP RECORDER INSERTION N/A 03/05/2020   Procedure: LOOP RECORDER INSERTION;  Surgeon: Deboraha Sprang, MD;  Location: [facility];  Service: Cardiovascular;  Laterality: N/A;   TEE WITHOUT CARDIOVERSION N/A 03/05/2020   Procedure: TRANSESOPHAGEAL ECHOCARDIOGRAM (TEE);  Surgeon: Donato Heinz, MD;  Location: [facility] ENDOSCOPY;  Service: Cardiovascular;  Laterality: N/A;    There were no vitals filed for this visit.   Subjective Assessment - 08/18/21 1558     Subjective Feels good. Performing HEP, which continues to challenge her.    Patient is accompained by: Family member    Patient Stated  Goals improve balance, strength.    Currently in Pain? No/denies    Pain Onset Today   Pt states she was doing an exercise in PT that was hurting her wrist, but discomfort was improving after cessation of exercise                              [facility] Adult PT Treatment/Exercise - 08/18/21 0001       Ambulation/Gait   Ambulation/Gait Yes    Ambulation/Gait Assistance 4: Min guard    Ambulation Distance (Feet) 500 Feet    Assistive device None    Gait Pattern Step-to pattern;Decreased stance time - right;Decreased weight shift to right;Ataxic    Ambulation Surface Level;Unlevel;Outdoor;Paved;Grass      Lumbar Exercises: Aerobic   Elliptical L1.2 x 4 minutes, then 2 x 30 seconds at 2.5 with emphasis on circular pattern with RLE.      Knee/Hip Exercises: Plyometrics   Other Plyometric Exercises Floor ladder. jump over each rung to the side, R, then L. Increased difficulty moving to her L, which required her to jump off of RLE. 2 x in each direction. BUE support on therapists arms and min A for balance.    Other Plyometric Exercises Floor ladder. Jump forward and back with both feet into and back out of a square, UUE support, therapsit facilitated speed. Repeated side to  side into and out of box, but she was unable to coordinate LEs to move together and R foot ended up stepping on L, so deferred.      Knee/Hip Exercises: Standing   SLS Practiced SLS, initially unable to stand on RLE, but with practice and therapist providing education to shift further to her R, she wa able to improve to up to 6 seconds on R.    Other Standing Knee Exercises Floor ladder. Quick walking through the ladder forward, then quick steps into the spaces, forward, back and side to side.                     PT Education - 08/18/21 1624     Education Details HEP updated to include SLS with opposite leg moving into hi flex and extension.    Person(s) Educated Patient    Methods  Explanation    Comprehension Verbalized understanding;Returned demonstration              PT Short Term Goals - 08/16/21 0930       PT SHORT TERM GOAL #3   Title I with newly updated HEP    Baseline activities added    Time 1    Period Weeks    Status On-going    Target Date 08/23/21               PT Long Term Goals - 08/16/21 0930       PT LONG TERM GOAL #1   Title Independent with advanced HEP    Baseline HEP evolving as patient progresses.    Time 9    Period Weeks    Status On-going    Target Date 10/04/21      PT LONG TERM GOAL #3   Title TUG improved to </= 10 seconds with SPC or independent, no LOB to demonstrate improved functional gait speed and decreased falls risk    Baseline 15.55 sec, no AD    Time 5    Period Weeks    Status On-going    Target Date 09/15/21      PT LONG TERM GOAL #4   Title Improved BUE and BLE strength to at least 4+/5    Baseline (4-)-4/5 throughout, improving    Time 5    Period Weeks    Status On-going    Target Date 09/15/21                   Plan - 08/18/21 1626     Clinical Impression Statement Patient demonstrating slowly improved control of RLE, but decreased coordination with faster movements. HEP remains challenging, added SLS as well. She is going on vacation and will return to therapy after her trip.    Stability/Clinical Decision Making Evolving/Moderate complexity    Clinical Decision Making Moderate    Rehab Potential Good    PT Frequency 2x / week    PT Duration Other (comment)   5w   PT Treatment/Interventions ADLs/Self Care Home Management;Cryotherapy;Electrical Stimulation;Iontophoresis 4mg /ml Dexamethasone;Moist Heat;Functional mobility training;Therapeutic activities;Therapeutic exercise;Balance training;Neuromuscular re-education;Patient/family education;Manual techniques;Energy conservation;Vestibular;Canalith Repostioning;Gait training;Stair training    PT Next Visit Plan Re-assess HEP,  update as appropriate. Continue coordinatoin and cotnrol of RLE.    Consulted and Agree with Plan of Care Patient             Patient will benefit from skilled therapeutic intervention in order to improve the following deficits and impairments:  Abnormal gait, Decreased range of motion,  Difficulty walking, Impaired UE functional use, Increased muscle spasms, Decreased endurance, Pain, Improper body mechanics, Decreased balance, Decreased strength, Decreased mobility  Visit Diagnosis: Other lack of coordination  Essential hypertension  Other symptoms and signs involving the nervous system  History of CVA (cerebrovascular accident)  Ataxic gait  Muscle weakness (generalized)  Unsteadiness on feet     Problem List Patient Active Problem List   Diagnosis Date Noted   Elevated liver enzymes 06/28/2021   Iron deficiency anemia 06/10/2021   TIA (transient ischemic attack) 05/09/2021   Chronic diastolic heart failure (grade 1 DD) 05/09/2021   Primary hypertension    Obstructive hydrocephalus (HCC)    Tachycardia    Acute cerebral infarction [facility])    Visual disturbance as complication of stroke    Transaminitis    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 coli UTI 11/01/2013   Anemia due to blood loss, acute 10/24/2013   Pelvic fracture (HCC) 10/23/2013   Hyperglycemia 09/18/2013   Routine general medical examination at a health care facility 09/18/2013   Elevated  alkaline phosphatase level 09/18/2013   Hot flashes, menopausal 04/10/2013   Pain in joint, upper arm 04/10/2013    Iona Beard, DPT 08/18/2021, 4:29 PM  [facility] [address] [place], [place], [address] Phone: [phone]   Fax:  [phone]  Name: Judy Gutierrez MRN: [id number] Date of Birth: [birth year]

## 2021-08-19 ENCOUNTER — Ambulatory Visit (INDEPENDENT_AMBULATORY_CARE_PROVIDER_SITE_OTHER): Payer: No Typology Code available for payment source | Admitting: Neurology

## 2021-08-19 ENCOUNTER — Ambulatory Visit: Payer: No Typology Code available for payment source | Admitting: Neurology

## 2021-08-19 ENCOUNTER — Encounter: Payer: Self-pay | Admitting: Neurology

## 2021-08-19 VITALS — BP 127/76 | HR 82 | Ht 62.0 in | Wt 125.0 lb

## 2021-08-19 DIAGNOSIS — I7774 Dissection of vertebral artery: Secondary | ICD-10-CM

## 2021-08-19 DIAGNOSIS — I6990 Unspecified sequelae of unspecified cerebrovascular disease: Secondary | ICD-10-CM | POA: Diagnosis not present

## 2021-08-19 DIAGNOSIS — I69393 Ataxia following cerebral infarction: Secondary | ICD-10-CM

## 2021-08-19 NOTE — Patient Instructions (Signed)
I had a long d/w patient and her husband about her recent stroke,vertebral artery dissection, risk for recurrent stroke/TIAs, personally independently reviewed imaging studies and stroke evaluation results and answered questions.Continue aspirin 81 mg daily  for secondary stroke prevention and maintain strict control of hypertension with blood pressure goal below 130/90, diabetes with hemoglobin A1c goal below 6.5% and lipids with LDL cholesterol goal below 70 mg/dL. 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  She was counseled to use a cane when walking long distances and she may start driving cautiously.  Followup in the future with me in 6 months or call earlier if necessary.

## 2021-08-19 NOTE — Progress Notes (Signed)
[facility] [address] [place]. [place] [address] [phone]       OFFICE FOLLOW-UP NOTE  [name] Date of Birth:  09-23-1961 Medical Record Number:  [id number]   HPI: Initial visit 03/04/2020  :[name] is a 60 year old [place] origin lady seen today for initial office follow-up visit following hospital consultation for stroke in April 2021.  She is accompanied by her husband.  History is obtained from them, review of electronic medical records and I personally reviewed imaging films in PACS.  She presented to med [facility] emergency room with sudden onset of dizziness, diplopia, gait imbalance and vomiting.  She was seen by telemetry neurologist and given IV TPA posterior circulation stroke.  She was transferred to [facility].  Initial CT scan of the head was unremarkable but MRI scan showed a small left superior cerebellar as well as medial midbrain infarct.  2D echo showed normal ejection fraction without cardiac source of embolism.  Cardiac monitoring during hospitalization did not show any paroxysmal A. fib.  Transcranial Doppler bubble study was negative for PFO.  LDL cholesterol is elevated 138 mg percent.  Hemoglobin A1c was borderline at 6.0.  Hypercoagulable labs and vasculitic labs were negative.  CT angiogram of the brain and neck showed no significant large vessel stenosis in the neck of the brain.  Patient was discharged home on aspirin and Plavix for 3 weeks and now is currently on aspirin alone.  Patient states she is doing well.  She is getting home physical and occupational therapy.  Her dizziness and gait imbalance have improved completely.  Double vision is also gone.  She had called the office few weeks ago complaining of eye twitching and some numbness on the right side and was wondering if this was related to the transcranial Doppler study that she had had.  The symptoms also have gone.  She is scheduled to undergo TEE  tomorrow followed by loop recorder.  She is tolerating aspirin well without bruising or bleeding.  She is also tolerating Lipitor well without muscle aches and pains.  Her blood pressure is well controlled.  She ran out of Cozaar  3 days ago but yet her blood pressure today is fine at 128/83. Update 08/19/2020 : She returns for follow-up after last visit 6 months ago.  She continues to do well.  She has had no recurrent stroke or TIA symptoms.  She is tolerating aspirin well without bruising or bleeding.  She remains on Lipitor and tolerating it well without muscle aches and pains.  She had follow-up lipid profile checked on 06/01/2020 and LDL cholesterol was 51 mg percent.  She had TEE done on 03/05/2020 which was unremarkable and she underwent loop recorder insertion.  So far monthly analysis has shown few episodes of transient A. fib which were felt to be artifact but no real A. fib was found.  She states she is recovered completely back to normal.  She has no complaints.  Her blood pressures well controlled today it is borderline at 134/68.  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 Update 05/11/2021 : Patient is seen emergently today for follow-up following recent admission for TIA.  She states that on 05/09/2021 when she bent down she noticed sudden onset of numbness involving left face as well as some dizziness and imbalance.  This did not last long and recovered.  She was admitted to  the hospital for TIA work-up and MRI scan of the brain did not show an acute stroke and encephalomalacia in bilateral cerebellum from a previous stroke.  CT angiogram of brain and neck was both unremarkable however she underwent diagnostic catheter angiogram today by Dr. 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 2.Focal circumferential narrowing of  the Lt VA at the level of  the dens also suspicious for a non flow limiting dissection..  Patient was on aspirin prior to admission and Plavix was added and she was discharged.  She has finished home physical occupational therapy and is planning to start outpatient therapy soon.  She states her dizziness and speech have improved.  Her strength is also a lot better with her balance and gait not back to baseline.  She is able to walk with a cane by herself but has to walk slowly and the right leg is stiff and slow.  She has had no falls or injuries.  She is also on amlodipine and blood pressure is under good control and Lipitor which is tolerating well without muscle aches and pains.  Her LDL was 41 mg percent and hemoglobin A1c 6.1 on 05/09/2021, she has a loop recorder in situ and so far paroxysmal A. fib has not yet been found  Update 08/19/2021: She returns for follow-up after last visit 3 months ago.  She is accompanied by husband.  Patient continues to have mild right hand diminished coordination and dexterity as well as some gait and balance difficulties.  She is able to walk short distances indoors without a cane but prefers using cane for long distances.  She has had no falls or injuries.  She is finished outpatient occupational therapy with has been doing hand exercises regularly at home.  She is still doing outpatient physical therapy but has to pay out-of-pocket.  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  She does get tired easily.  She is tolerating aspirin well without bleeding or bruising.  She states her blood pressure is under good control.  She is remains on Lipitor which is tolerating well without muscle aches and pain.  She had lipid profile checked a month ago by primary care physician and it was satisfactory.  She is planning on going on a trip to [place] at the end of this month.  She has no new complaints ROS:   14 system review of  systems is positive for dizziness, imbalance, gait difficulty and all other systems negative and all systems negative PMH:  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)    Stroke (HCC)    Unspecified essential hypertension     Social History:  Social History   Socioeconomic History   Marital status: Married    Spouse name: Not on file   Number of children: 1   Years of education: Not on file   Highest education level: Not on file  Occupational History   Occupation: convenience store    Comment: self employed  Tobacco Use   Smoking status: Never   Smokeless tobacco: Never  Substance and Sexual Activity   Alcohol use: No   Drug use: No  Sexual activity: Yes  Other Topics Concern   Not on file  Social History Narrative   Lives with husband   Right Handed   Drinks caffeine seldom   Social Determinants of Health   Financial Resource Strain: Not on file  Food Insecurity: Not on file  Transportation Needs: Not on file  Physical Activity: Not on file  Stress: Not on file  Social Connections: Not on file  Intimate Partner Violence: Not on file    Medications:   Current Outpatient Medications on File Prior to Visit  Medication Sig Dispense Refill   amLODipine (NORVASC) 5 MG tablet Take 1 tablet (5 mg total) by mouth daily. 90 tablet 3   aspirin 81 MG EC tablet Take 1 tablet (81 mg total) by mouth daily. 90 tablet 1   atorvastatin (LIPITOR) 40 MG tablet Take 2 tablets (80 mg total) by mouth every evening.     Calcium Carbonate-Vit D-Min (CALCIUM 1200 PO) Take 1 tablet by mouth daily.     No current facility-administered medications on file prior to visit.    Allergies:  No Known Allergies  Physical Exam General: Frail middle-aged Caucasian lady seated, in no evident distress Head: head normocephalic and atraumatic.  Neck:  supple with no carotid or supraclavicular bruits Cardiovascular: regular rate and rhythm, no murmurs Musculoskeletal: no deformity Skin:  no rash/petichiae Vascular:  Normal pulses all extremities Vitals:   08/19/21 0828  BP: 127/76  Pulse: 82    Neurologic Exam Mental Status: Awake and fully alert. Oriented to place and time. Recent and remote memory intact. Attention span, concentration and fund of knowledge appropriate. Mood and affect appropriate.  Cranial Nerves: Fundoscopic exam not done s. Pupils equal, briskly reactive to light. Extraocular movements full without nystagmus. Visual fields full to confrontation. Hearing intact. Facial sensation intact. Face, tongue, palate moves normally and symmetrically.  Motor: Normal bulk and tone. Normal strength in all tested extremity muscles.  Except diminished fine finger movements on the right and mild right grip weakness.  Trace right hip flexor and ankle dorsiflexor weakness.  Tone is increased on the right with some spasticity in the right leg. Sensory.: intact to touch ,pinprick .position and vibratory sensation.  Coordination: Rapid alternating movements are slightly impaired on the right proportionate to degree of weakness.   Gait and Station: Arises from chair without difficulty. Stance is normal.  Mildly spastic ataxic gait with stiffness and dragging of the right leg but no foot drop..  Reflexes: 2+ and asymmetric and brisker on the right. Toes downgoing.   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  Posterior circulation TIA on 05/09/2021 likely symptomatic from partially healed vertebral artery dissection.  Vascular risk factors of hyperlipidemia and vertebral artery dissection only.   Patient is doing well but has mild residual right-sided ataxia and gait imbalance.     PLAN: I had a long d/w patient and her husband about her recent stroke,vertebral artery dissection, risk for recurrent stroke/TIAs, personally independently reviewed imaging studies and stroke evaluation results and answered questions.Continue aspirin 81 mg daily  for secondary stroke prevention and maintain strict control of hypertension with blood pressure goal below 130/90, diabetes with hemoglobin A1c goal below 6.5% and lipids with LDL cholesterol goal below 70 mg/dL. I also advised the  patient to eat a healthy diet with plenty of whole grains, cereals, fruits and vegetables, exercise regularly and maintain ideal body weight .check follow-up CT angiogram to look for healing of the vertebral artery dissection.  She was counseled to use a cane when walking long distances and she may start driving cautiously.  Followup in the future with me in 6 months or call earlier if necessary. Greater than 50% of time during this prolonged 35-minute isit was spent on counseling,explanation of diagnosis, of TIA and vertebral artery dissection planning of further management, discussion with patient and family and coordination of care Antony Contras, MD  [facility] [address] [place], [place] [address]  Phone [phone] Fax [phone] Note: This document was prepared with digital dictation and possible smart phrase technology. Any transcriptional errors that result from this process are unintentional

## 2021-08-20 ENCOUNTER — Encounter: Payer: No Typology Code available for payment source | Admitting: Physical Therapy

## 2021-08-23 ENCOUNTER — Telehealth: Payer: Self-pay | Admitting: Neurology

## 2021-08-23 NOTE — Telephone Encounter (Signed)
[facility]: [id number] & [phone] (exp. 08/23/21 to 10/07/21) order sent to GI. They will reach out to the patient to schedule.

## 2021-08-25 ENCOUNTER — Ambulatory Visit
Admission: RE | Admit: 2021-08-25 | Discharge: 2021-08-25 | Disposition: A | Discharge status: Home / Self Care | Payer: No Typology Code available for payment source | Source: Ambulatory Visit | Attending: Neurology | Admitting: Neurology

## 2021-08-25 ENCOUNTER — Other Ambulatory Visit: Payer: No Typology Code available for payment source

## 2021-08-25 ENCOUNTER — Other Ambulatory Visit: Payer: Self-pay

## 2021-08-25 MED ORDER — IOPAMIDOL (ISOVUE-370) INJECTION 76%
75.0000 mL | Freq: Once | INTRAVENOUS | Status: AC | PRN
  Administered 2021-08-25: 75 mL via INTRAVENOUS

## 2021-08-26 ENCOUNTER — Telehealth: Payer: Self-pay | Admitting: Neurology

## 2021-08-26 LAB — CUP PACEART REMOTE DEVICE CHECK
Date Time Interrogation Session: 20221108230056
Implantable Pulse Generator Implant Date: 20210520

## 2021-08-26 NOTE — Telephone Encounter (Addendum)
IMPRESSION: 1. 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 But no associated dissection flap or stenosis. Right PICA is patent, but attenuated.   2. Minimal irregularity of the distal Left Vertebral Artery as it crosses the dura, with no dissection flap or stenosis. Otherwise negative posterior circulation.   3. Mild carotid atherosclerosis in the head and neck with no significant stenosis. Otherwise negative anterior circulation.   4. Chronic right > left cerebellar infarcts, dorsal midbrain encephalomalacia. No acute intracranial abnormality.   5. Acute on chronic left maxillary and sphenoid sinusitis.  4 vessel angiogram in July 2022: Smooth oblong filling defect of the left vertebrobasilar junction just distal to the left posterior meningeal artery associated with an intimal flap consistent with a dissection. No evidence of hemodynamic restriction of flow is seen.   Small focal circumferential narrowing of the left vertebrobasilar junction at the C2 level with non uniform contrast enhancement suspicious of a focal dissection.    The right vertebral artery origin is widely patent.   The vessel demonstrates mild tortuosity in its proximal 1/3.   More distally the vessel is seen to ascend to the cranial skull base. Smooth well-defined filling defect is seen associated with an intimal flap best seen on lateral images between the posterior meningeal branch of the right vertebral artery, and the right posterior-inferior cerebellar artery.   Distal to this, the vertebrobasilar junction demonstrates wide patency. Opacification is seen to the level of the superior cerebellar arteries with inflow of unopacified blood from the contralateral left vertebral artery at the level of the anterior inferior cerebellar arteries.

## 2021-08-30 ENCOUNTER — Encounter: Payer: Self-pay | Admitting: Occupational Therapy

## 2021-08-30 ENCOUNTER — Ambulatory Visit: Payer: No Typology Code available for payment source | Admitting: Occupational Therapy

## 2021-08-30 ENCOUNTER — Ambulatory Visit: Payer: No Typology Code available for payment source | Admitting: Physical Therapy

## 2021-08-30 ENCOUNTER — Other Ambulatory Visit: Payer: Self-pay

## 2021-08-30 ENCOUNTER — Ambulatory Visit (INDEPENDENT_AMBULATORY_CARE_PROVIDER_SITE_OTHER): Payer: No Typology Code available for payment source

## 2021-08-30 ENCOUNTER — Encounter: Payer: Self-pay | Admitting: Physical Therapy

## 2021-08-30 DIAGNOSIS — Z8673 Personal history of transient ischemic attack (TIA), and cerebral infarction without residual deficits: Secondary | ICD-10-CM

## 2021-08-30 DIAGNOSIS — R260 Ataxic gait: Secondary | ICD-10-CM

## 2021-08-30 DIAGNOSIS — R278 Other lack of coordination: Secondary | ICD-10-CM

## 2021-08-30 DIAGNOSIS — R29818 Other symptoms and signs involving the nervous system: Secondary | ICD-10-CM

## 2021-08-30 DIAGNOSIS — M6281 Muscle weakness (generalized): Secondary | ICD-10-CM

## 2021-08-30 DIAGNOSIS — I639 Cerebral infarction, unspecified: Secondary | ICD-10-CM | POA: Diagnosis not present

## 2021-08-30 DIAGNOSIS — R2681 Unsteadiness on feet: Secondary | ICD-10-CM

## 2021-08-30 NOTE — Therapy (Signed)
[place]. [place], [place], [address] Phone: [phone]   Fax:  [phone]  Occupational Therapy Treatment & Discharge Summary  Patient Details  Name: Judy Gutierrez MRN: [id number] Date of Birth: Mar 12, 1961 Referring Provider (OT): Alysia Penna, MD   Encounter Date: 08/30/2021   OT End of Session - 08/30/21 0934     Visit Number 14    Number of Visits 15   Initial 13 + 2 additional   Date for OT Re-Evaluation 09/15/21    Authorization Type [employer] plan does not cover OT/ST)    OT Start Time [phone]    OT Stop Time 1016    OT Time Calculation (min) 45 min    Activity Tolerance Patient tolerated treatment well    Behavior During Therapy WFL for tasks assessed/performed            OCCUPATIONAL THERAPY DISCHARGE SUMMARY  Visits from Start of Care: 14  Current functional level related to goals / functional outcomes: Pt is independent w/ HEP, increased forearm supination by 9 degrees, is able to complete 9-Hole Peg Test in less than 1 minute, smoothness smoothness and timing w/ handwriting tasks, and is able to complete simulated housekeeping activities, gardening task, and self-feeding w/ Mod I.   Remaining deficits: Pt continued to demonstrate decreased GM and FM control and coordination of RUE, limitations w/ balance and functional mobility, as well as mild deficits w/ processing speed during high level executive functioning tasks   Education / Equipment: CVA condition-specific education, individualized HEP, energy conservation strategies, and compensatory strategies, including AE (e.g., gardening, handwriting, self-feeding/meal prep)  Patient agrees to discharge. Patient goals were met. Patient is being discharged due to meeting the stated rehab goals, independence/compliance w/ HEP, and maximizing therapy potential at this time.   Past Medical History:  Diagnosis Date    Disturbance of skin sensation    Myalgia and myositis, unspecified    Other B-complex deficiencies    Other disorders of bone and cartilage(733.99)    Stroke [facility])    Unspecified essential hypertension     Past Surgical History:  Procedure Laterality Date   BUBBLE STUDY  03/05/2020   Procedure: BUBBLE STUDY;  Surgeon: Donato Heinz, MD;  Location: [place];  Service: Cardiovascular;;   CESAREAN SECTION  1992   IR ANGIO INTRA EXTRACRAN SEL COM CAROTID INNOMINATE BILAT MOD SED  05/11/2021   IR ANGIO VERTEBRAL SEL SUBCLAVIAN INNOMINATE BILAT MOD SED  05/11/2021   LOOP RECORDER INSERTION N/A 03/05/2020   Procedure: LOOP RECORDER INSERTION;  Surgeon: Deboraha Sprang, MD;  Location: [place] CV LAB;  Service: Cardiovascular;  Laterality: N/A;   TEE WITHOUT CARDIOVERSION N/A 03/05/2020   Procedure: TRANSESOPHAGEAL ECHOCARDIOGRAM (TEE);  Surgeon: Donato Heinz, MD;  Location: [facility] ENDOSCOPY;  Service: Cardiovascular;  Laterality: N/A;    There were no vitals filed for this visit.   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 No/denies             Treatment/Exercises - 08/30/21  Neuromuscular Reeducation     Weight Bearing Weight bearing through R elbow and forearm, disengaging LUE for body on arm movements (reaching up to open chest and reaching contralaterally between trunk and RUE) to increase weight and proprioceptive input/feedback over RUE. Completed 3 sets of 10 reps while sitting EOM; pt required initial verbal cues and positioning adjustments for appropriate alignment.    Wall Push-Ups Standing  pushups w/ hands at shoulder height to increase weight and proprioceptive input through RUE for increased [place]. Pt completed 3 sets of 10 reps w/ occasional verbal cues to decrease compensatory pattern w/ trunk (back extension); able to complete w/out discomfort/additional difficulty    Therapeutic Activities    Handwriting Pt practiced handwriting on standard paper using weighted pen w/ good legibility; demonstrated increased smoothness of lines compared to attempts in prior sessions. OT also reviewed benefit of weight utensils; see 'OT Education' section below for detail    "Clock" Activity Tapping the number corresponding to the month called out using short dowel rod; pt completed activity in standing w/ numbers positioned on the wall following the pattern of a traditional analog clock. Activity facilitated dual tasking (physical and cognitive skills), alternating attention, standing balance, visual scanning, targeting, and BUE GMC/coordination. To grade activity up, pt instructed to tap even-numbered targets w/ RUE and odd numbers w/ LUE.            OT Education - 08/30/21 0945     Education Details Reviewed benefit of weighted utensils and discussed DIY modifications to add weight to eating utensils and writing instruments. Also discussed benefit of graded return to driving due to pt receiving release to drive from neurologist.    Person(s) Educated Patient    Methods Explanation;Demonstration    Comprehension Verbalized understanding             OT Short Term Goals - 06/25/21 0804       OT SHORT TERM GOAL #1   Title Pt will demonstrate independence w/ HEP designed for increased BUE strength and ROM    Time 4    Period Weeks    Status Achieved    Target Date 06/18/21      OT SHORT TERM GOAL #2   Title Pt will improve R forearm supination by at least 4 degrees to improve participation in cooking tasks    Baseline R forearm supination 61* (LUE 71*)    Time 4    Period Weeks     Status Achieved   06/14/21 - 70*     OT SHORT TERM GOAL #3   Title Pt will complete 9-HPT w/ R hand in less than 1 minute to demonstrate improvement in functional FM skills w/ R, dominant UE    Baseline R hand 1 min, 6 sec; L hand 28 sec    Time 4    Period Weeks    Status Achieved   06/14/21 - 51 sec (15 sec improvement)     OT SHORT TERM GOAL #4   Title Pt will improve functional use of RUE as evidenced by completing bilateral integration task (e.g., folding clothes, cutting food) within pt-determined acceptable level of time    Time 4    Period Weeks    Status Achieved             OT Long Term Goals - 08/30/21 1108       OT LONG TERM GOAL #1   Title Pt will be independent w/ full HEP designed for increased coordination  and functional use of RUE    Time 8    Period Weeks    Status Achieved   07/23/21     OT LONG TERM GOAL #2   Title Pt will complete gardening task at Mod I, using AE/compensatory strategies for safety prn    Time 4    Period Weeks    Status Achieved   08/09/21 - completed simulated gardening activity w/ Mod I     OT LONG TERM GOAL #3   Title Pt will report completing at least 75% of desired household cleaning activities w/ SPV prn for safety    Time 8    Period Weeks    Status Achieved   06/25/21     OT LONG TERM GOAL #4   Title Pt will be able to write a 5-7 word sentence, using AE prn, in less than 1 min, 30 sec demonstrating increased smoothness of lines and legibility    Baseline 1 min, 43 seconds to complete 5-word sentence w/out AE    Time 4    Period Weeks    Status Achieved   08/30/21     OT LONG TERM GOAL #5   Title To improve participation w/ self-feeding, pt will be able to scoop 5 beans from a bowl one-at-a-time w/ R, dominant UE and bring to mouth, using adaptive utensils prn    Baseline Greater than 60 secs w/ multiple drops when using spoon to scoop 5 beans from bowl and placing in can    Time 4    Period Weeks    Status Achieved    08/09/21 - completed task w/out drops using weighted spoon            Plan - 08/30/21 0949     Clinical Impression Statement Mrs. Logue is currently 6 months s/p CVA w/ residual R-sided deficits. She has demonstrated improvements in GM and [place], coordination, visual perception, balance, and participation/safety in functional activities w/ all STGs and LTGs met. Considering pt's progress, consistency w/ HEP, and pt's report of going on an extended family trip toward the end of this month, OT discussed appropriateness of d/c to HEP this session. Pt reports she is comfortable w/ her HEP, satisfied w/ her progress thus far, and agreeable to d/c plan at this time. After returning from her trip, pt states she would like to receive a screen/re-evaluation to determine if it is appropriate for her to return to skilled OT services at that time; OT encouraged this as well as reaching out w/ any additional questions/conerns and following up w/ MD as appropriate. This session, OT continued to facilitate neuromuscular reeducation exercises to increase input and feedback through RUE and reviewed compensatory strategies, including AE prn, to improve safety and independence w/ functional daily activities.    OT Occupational Profile and History Detailed Assessment- Review of Records and additional review of physical, cognitive, psychosocial history related to current functional performance    Occupational performance deficits (Please refer to evaluation for details): ADL's;IADL's;Work;Leisure    Body Structure / Function / Physical Skills ADL;ROM;UE functional use;Balance;Decreased knowledge of use of DME;FMC;Vision;Dexterity;Body mechanics;GMC;Strength;Endurance;Coordination;IADL    Rehab Potential Good    Clinical Decision Making Several treatment options, min-mod task modification necessary    Comorbidities Affecting Occupational Performance: May have comorbidities impacting occupational performance    Modification  or Assistance to Complete Evaluation  Min-Moderate modification of tasks or assist with assess necessary to complete eval    OT Frequency 1x / week  Decreased frequency due to insurance coverage   OT Duration 8 weeks    OT Treatment/Interventions Self-care/ADL training;Electrical Stimulation;Therapeutic exercise;Visual/perceptual remediation/compensation;Patient/family education;Splinting;Neuromuscular education;Moist Heat;Energy conservation;Therapist, nutritional;Therapeutic activities;Balance training;Passive range of motion;Manual Therapy;DME and/or AE instruction;Ultrasound;Cryotherapy    Plan D/C    Consulted and Agree with Plan of Care Patient;Family member/caregiver    Family Member Consulted Husband (Ajay)            Patient will benefit from skilled therapeutic intervention in order to improve the following deficits and impairments:   Body Structure / Function / Physical Skills: ADL, ROM, UE functional use, Balance, Decreased knowledge of use of DME, FMC, Vision, Dexterity, Body mechanics, GMC, Strength, Endurance, Coordination, IADL   Visit Diagnosis: Other lack of coordination  Other symptoms and signs involving the nervous system  Muscle weakness (generalized)  Unsteadiness on feet   Problem List Patient Active Problem List   Diagnosis Date Noted   Elevated liver enzymes 06/28/2021   Iron deficiency anemia 06/10/2021   TIA (transient ischemic attack) 05/09/2021   Chronic diastolic heart failure (grade 1 DD) 05/09/2021   Primary hypertension    Obstructive hydrocephalus (HCC)    Tachycardia    Acute cerebral infarction ([place])    Visual disturbance as complication of stroke    Transaminitis    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 coli UTI 11/01/2013   Anemia due to blood loss, acute 10/24/2013   Pelvic fracture ([place]) 10/23/2013   Hyperglycemia 09/18/2013   Routine general medical examination at a health care facility 09/18/2013   Elevated alkaline phosphatase level 09/18/2013   Hot flashes, menopausal 04/10/2013   Pain in joint, upper arm 04/10/2013    Kathrine Cords, OTR/L, MSOT 08/30/2021, 1:10 PM  [place]. [place], [place], [address] Phone: [phone]   Fax:  [phone]  Name: Judy Gutierrez MRN: [id number] Date of Birth: Dec 13, 1960

## 2021-08-30 NOTE — Therapy (Signed)
[facility]. [place], [place], [address] Phone: [phone]   Fax:  [phone]  Physical Therapy Treatment  Patient Details  Name: Judy Gutierrez MRN: [id number] Date of Birth: 23-Feb-1961 Referring Provider (PT): Kirsteins   Encounter Date: 08/30/2021   PT End of Session - 08/30/21 1114     Visit Number 2810    Date for PT Re-Evaluation 09/15/21    PT Start Time 1017    PT Stop Time 1058    PT Time Calculation (min) 41 min    Equipment Utilized During Treatment Gait belt    Activity Tolerance Patient tolerated treatment well    Behavior During Therapy WFL for tasks assessed/performed             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)    Stroke ([place])    Unspecified essential hypertension     Past Surgical History:  Procedure Laterality Date   BUBBLE STUDY  03/05/2020   Procedure: BUBBLE STUDY;  Surgeon: Donato Heinz, MD;  Location: [place];  Service: Cardiovascular;;   CESAREAN SECTION  1992   IR ANGIO INTRA EXTRACRAN SEL COM CAROTID INNOMINATE BILAT MOD SED  05/11/2021   IR ANGIO VERTEBRAL SEL SUBCLAVIAN INNOMINATE BILAT MOD SED  05/11/2021   LOOP RECORDER INSERTION N/A 03/05/2020   Procedure: LOOP RECORDER INSERTION;  Surgeon: Deboraha Sprang, MD;  Location: [place] CV [facility];  Service: Cardiovascular;  Laterality: N/A;   TEE WITHOUT CARDIOVERSION N/A 03/05/2020   Procedure: TRANSESOPHAGEAL ECHOCARDIOGRAM (TEE);  Surgeon: Donato Heinz, MD;  Location: [facility] ENDOSCOPY;  Service: Cardiovascular;  Laterality: N/A;    There were no vitals filed for this visit.                      [facility] Adult PT Treatment/Exercise - 08/30/21 0001       Ambulation/Gait   Gait Comments ambulated 1 x 100' wiht increaed speed, minimal  difficulty with RLE control, increased during turns, no Ad, S, no unsteadiness.      Timed Up and Go Test   TUG Normal TUG    Normal TUG (seconds) 13.72      Neuro Re-ed    Neuro Re-ed Details  Patient performed seated RLE control and coordination activities in space, pedling leg forward and back, then side to side as quickly and controlled as possible.      Knee/Hip Exercises: Plyometrics   Other Plyometric Exercises Rebounder- jump iwth abd/add, able to complete 20 reps at faster speed. Jump switch in stride positoin, 10 reps.      Knee/Hip Exercises: Standing   SLS SLS on Air Ex pad, in parallel bars for safety. Patient instructed to rely on BUe support as little as possible. LLE moved slowly into hip flex/ext, and then into L hip abd/add x 10 each. She required min UE support.    Other Standing Knee Exercises SLS-at 4" step tapping oppostie ends of step with LLE, rotating at the hips, 10 reps, UUE support and CGA.                     PT Education - 08/30/21 1112     Education Details Educated to POC emphasizing coordination and control of RLe in both WB and in NWB situations.  Person(s) Educated Patient    Methods Explanation    Comprehension Verbalized understanding              PT Short Term Goals - 08/30/21 1026       PT SHORT TERM GOAL #3   Title I with newly updated HEP    Baseline activities added    Time 1    Period Weeks    Status Achieved    Target Date 08/23/21               PT Long Term Goals - 08/30/21 1028       PT LONG TERM GOAL #1   Title Independent with advanced HEP    Baseline HEP evolving as patient progresses.    Time 9    Period Weeks    Status On-going      PT LONG TERM GOAL #3   Title TUG improved to </= 10 seconds with SPC or independent, no LOB to demonstrate improved functional gait speed and decreased falls risk    Baseline 13.72 no AD    Time 3    Period Weeks    Status On-going    Target Date 09/15/21       PT LONG TERM GOAL #4   Title Improved BUE and BLE strength to at least 4+/5    Baseline 4/5 throughout, improving    Time 5    Period Weeks    Status On-going    Target Date 09/15/21                   Plan - 08/30/21 1100     Clinical Impression Statement Patient on vacation last week, states she did a lot of walking and did keep up wiht HEP. She demonstrates improved speed during TUG, improved control of RLE by observaition of standing activities.    Stability/Clinical Decision Making Evolving/Moderate complexity    Clinical Decision Making Moderate    Rehab Potential Good    PT Frequency 2x / week    PT Duration 4 weeks   5w   PT Treatment/Interventions ADLs/Self Care Home Management;Cryotherapy;Electrical Stimulation;Iontophoresis 4mg /ml Dexamethasone;Moist Heat;Functional mobility training;Therapeutic activities;Therapeutic exercise;Balance training;Neuromuscular re-education;Patient/family education;Manual techniques;Energy conservation;Vestibular;Canalith Repostioning;Gait training;Stair training    PT Next Visit Plan UPdate HEP wit hcoordination exercises in NWB for RLE.    Consulted and Agree with Plan of Care Patient             Patient will benefit from skilled therapeutic intervention in order to improve the following deficits and impairments:  Abnormal gait, Decreased range of motion, Difficulty walking, Impaired UE functional use, Increased muscle spasms, Decreased endurance, Pain, Improper body mechanics, Decreased balance, Decreased strength, Decreased mobility  Visit Diagnosis: Other lack of coordination  Other symptoms and signs involving the nervous system  History of CVA (cerebrovascular accident)  Ataxic gait  Muscle weakness (generalized)  Unsteadiness on feet     Problem List Patient Active Problem List   Diagnosis Date Noted   Elevated liver enzymes 06/28/2021   Iron deficiency anemia 06/10/2021   TIA (transient ischemic attack)  05/09/2021   Chronic diastolic heart failure (grade 1 DD) 05/09/2021   Primary hypertension    Obstructive hydrocephalus (HCC)    Tachycardia    Acute cerebral infarction (HCC)    Visual disturbance as complication of stroke    Transaminitis    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 coli UTI 11/01/2013   Anemia due to blood loss, acute 10/24/2013   Pelvic fracture ([place]) 10/23/2013   Hyperglycemia 09/18/2013   Routine general medical examination at a health care facility 09/18/2013   Elevated alkaline phosphatase level 09/18/2013   Hot flashes, menopausal 04/10/2013   Pain in joint, upper arm 04/10/2013    Marcelina Morel, DPT 08/30/2021, 11:18 AM  [place]. [place], [place], [address] Phone: [phone]   Fax:  [phone]  Name: Judy Gutierrez MRN: [id number] Date of Birth: 08-07-1961

## 2021-09-01 ENCOUNTER — Telehealth: Payer: Self-pay | Admitting: Family Medicine

## 2021-09-01 NOTE — Telephone Encounter (Signed)
Pt. Called and stated that she still cant taste her food. She spoke with her neurologist and they suggested a change in medication. She would like a call back to discuss this in detail.

## 2021-09-02 ENCOUNTER — Encounter: Payer: Self-pay | Admitting: Physical Medicine & Rehabilitation

## 2021-09-02 ENCOUNTER — Encounter
Payer: No Typology Code available for payment source | Attending: Registered Nurse | Admitting: Physical Medicine & Rehabilitation

## 2021-09-02 ENCOUNTER — Other Ambulatory Visit: Payer: Self-pay

## 2021-09-02 VITALS — BP 132/75 | HR 94 | Temp 97.6°F | Ht 62.0 in | Wt 123.8 lb

## 2021-09-02 DIAGNOSIS — I639 Cerebral infarction, unspecified: Secondary | ICD-10-CM | POA: Diagnosis not present

## 2021-09-02 NOTE — Patient Instructions (Signed)
Will write therapy orders at next visit , is still needed

## 2021-09-02 NOTE — Progress Notes (Signed)
Subjective:    Patient ID: Judy Gutierrez, female    DOB: 07-19-1961, 60 y.o.   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  She has done very well with outpatient therapy.  She is walking without assistive device in the home but has a slow cadence and wide base of support.  She still has some incoordination of the right upper extremity.  She has tried some limited driving with her husband in the car. She has had no falls. Pain Inventory Average Pain 0 Pain Right Now 0 My pain is  no pain  In the last 24 hours, has pain interfered with the following? General activity 0 Relation with others 0 Enjoyment of life 0 What TIME of day is your pain at its worst? No pain Sleep (in general) NA  Pain is worse with:  no pain Pain improves with:  no pain Relief from Meds:  nopain  Family History  Problem Relation Age of Onset   Pulmonary embolism Mother    Hepatitis Father    Diabetes Father    Breast cancer Sister 74   Diabetes Mellitus II Sister    Social History   Socioeconomic History   Marital status: Married    Spouse name: Not on file   Number of children: 1   Years of education: Not on file   Highest education level: Not on file  Occupational History   Occupation: convenience store    Comment: self employed  Tobacco Use   Smoking status: Never   Smokeless tobacco: Never  Substance and Sexual Activity   Alcohol use: No   Drug use: No   Sexual activity: Yes  Other Topics Concern   Not on file  Social History Narrative   Lives with husband   Right Handed   Drinks caffeine seldom   Social Determinants of Health   Financial Resource Strain: Not on file  Food Insecurity: Not on file  Transportation Needs: Not on file  Physical Activity: Not on file  Stress: Not on file  Social Connections: Not on file   Past Surgical History:  Procedure Laterality Date   BUBBLE STUDY  03/05/2020    Procedure: BUBBLE STUDY;  Surgeon: [name], MD;  Location: [facility] ENDOSCOPY;  Service: Cardiovascular;;   CESAREAN SECTION  1992   IR ANGIO INTRA EXTRACRAN SEL COM CAROTID INNOMINATE BILAT MOD SED  05/11/2021   IR ANGIO VERTEBRAL SEL SUBCLAVIAN INNOMINATE BILAT MOD SED  05/11/2021   LOOP RECORDER INSERTION N/A 03/05/2020   Procedure: LOOP RECORDER INSERTION;  Surgeon: [name], MD;  Location: [facility] INVASIVE CV LAB;  Service: Cardiovascular;  Laterality: N/A;   TEE WITHOUT CARDIOVERSION N/A 03/05/2020   Procedure: TRANSESOPHAGEAL ECHOCARDIOGRAM (TEE);  Surgeon: [name], MD;  Location: [facility] ENDOSCOPY;  Service: Cardiovascular;  Laterality: N/A;   Past Surgical History:  Procedure Laterality Date   BUBBLE STUDY  03/05/2020   Procedure: BUBBLE STUDY;  Surgeon: [name], MD;  Location: [facility] ENDOSCOPY;  Service: Cardiovascular;;   CESAREAN SECTION  1992   IR ANGIO INTRA EXTRACRAN SEL COM CAROTID INNOMINATE BILAT MOD SED  05/11/2021   IR ANGIO VERTEBRAL SEL SUBCLAVIAN INNOMINATE BILAT MOD SED  05/11/2021   LOOP RECORDER INSERTION N/A 03/05/2020   Procedure: LOOP RECORDER INSERTION;  Surgeon: [name], MD;  Location: [facility] INVASIVE CV LAB;  Service: Cardiovascular;  Laterality: N/A;   TEE WITHOUT CARDIOVERSION N/A 03/05/2020  Procedure: TRANSESOPHAGEAL ECHOCARDIOGRAM (TEE);  Surgeon: [name], MD;  Location: [facility] ENDOSCOPY;  Service: Cardiovascular;  Laterality: N/A;   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)    Stroke [facility])    Unspecified essential hypertension    BP 132/75   Pulse 94   Temp 97.6 F (36.4 C)   Ht 5\' 2"  (1.575 m)   Wt 123 lb 12.8 oz (56.2 kg)   LMP 12/08/2010   SpO2 96%   BMI 22.64 kg/m   Opioid Risk Score:   Fall Risk Score:  `1  Depression screen PHQ  2/9  Depression screen [facility] 2/9 07/08/2021 03/29/2021 12/04/2020 06/28/2018 10/13/2016 09/04/2015 04/10/2013  Decreased Interest 0 0 0 2 0 0 0  Down, Depressed, Hopeless 0 0 0 0 0 0 0  PHQ - 2 Score 0 0 0 2 0 0 0  Altered sleeping - 0 - 0 - - -  Tired, decreased energy - 1 - 0 - - -  Change in appetite - 0 - 0 - - -  Feeling bad or failure about yourself  - 0 - 0 - - -  Trouble concentrating - 0 - 0 - - -  Moving slowly or fidgety/restless - 0 - 0 - - -  Suicidal thoughts - 0 - 0 - - -  PHQ-9 Score - 1 - 2 - - -  Difficult doing work/chores - Somewhat difficult - Not difficult at all - - -     Review of Systems  Constitutional: Negative.   HENT: Negative.    Eyes: Negative.   Respiratory: Negative.    Cardiovascular: Negative.   Gastrointestinal: Negative.   Endocrine: Negative.   Genitourinary: Negative.   Musculoskeletal:  Positive for gait problem.  Skin: Negative.   Allergic/Immunologic: Negative.   Hematological: Negative.   Psychiatric/Behavioral: Negative.    All other systems reviewed and are negative.     Objective:   Physical Exam Vitals and nursing note reviewed.  Constitutional:      Appearance: She is normal weight.  HENT:     Head: Normocephalic and atraumatic.  Eyes:     Extraocular Movements: Extraocular movements intact.     Conjunctiva/sclera: Conjunctivae normal.     Pupils: Pupils are equal, round, and reactive to light.  Musculoskeletal:     Comments: No pain with upper extremity or lower extremity range of motion.  Skin:    General: Skin is warm and dry.  Neurological:     Mental Status: She is alert and oriented to person, place, and time.  Psychiatric:        Mood and Affect: Mood normal.        Behavior: Behavior normal.    Mild to moderate ataxia right finger-nose-finger is well as right heel-to-shin Ambulates with wide-based support no evidence of toe drag or knee instability.  Standing balance is fair      Assessment & Plan:  1.   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  Overall making good progress with outpatient therapy.  She is taking a trip to [place]/12[place]2013 and will be back early February.  We will reassess at that time she may need some additional therapy.

## 2021-09-03 ENCOUNTER — Telehealth: Payer: Self-pay | Admitting: Family Medicine

## 2021-09-03 ENCOUNTER — Ambulatory Visit: Payer: No Typology Code available for payment source | Admitting: Physical Therapy

## 2021-09-03 ENCOUNTER — Encounter: Payer: Self-pay | Admitting: Physical Therapy

## 2021-09-03 DIAGNOSIS — R278 Other lack of coordination: Secondary | ICD-10-CM

## 2021-09-03 DIAGNOSIS — M6281 Muscle weakness (generalized): Secondary | ICD-10-CM

## 2021-09-03 DIAGNOSIS — R260 Ataxic gait: Secondary | ICD-10-CM

## 2021-09-03 DIAGNOSIS — R2681 Unsteadiness on feet: Secondary | ICD-10-CM

## 2021-09-03 DIAGNOSIS — R29818 Other symptoms and signs involving the nervous system: Secondary | ICD-10-CM

## 2021-09-03 DIAGNOSIS — Z8673 Personal history of transient ischemic attack (TIA), and cerebral infarction without residual deficits: Secondary | ICD-10-CM

## 2021-09-03 NOTE — Therapy (Signed)
[address] [place], [place], [address] Phone: [phone]   Fax:  [phone]  Physical Therapy Treatment  Patient Details  Name: Judy Gutierrez MRN: [id number] Date of Birth: 1961/07/08 Referring Provider (PT): Kirsteins   Encounter Date: 09/03/2021   PT End of Session - 09/03/21 0934     Visit Number 29    Date for PT Re-Evaluation 09/15/21    PT Start Time 0845    PT Stop Time 0929    PT Time Calculation (min) 44 min    Equipment Utilized During Treatment Gait belt    Activity Tolerance Patient tolerated treatment well    Behavior During Therapy WFL for tasks assessed/performed             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)    Stroke ([place])    Unspecified essential hypertension     Past Surgical History:  Procedure Laterality Date   BUBBLE STUDY  03/05/2020   Procedure: BUBBLE STUDY;  Surgeon: Donato Heinz, MD;  Location: [place];  Service: Cardiovascular;;   CESAREAN SECTION  1992   IR ANGIO INTRA EXTRACRAN SEL COM CAROTID INNOMINATE BILAT MOD SED  05/11/2021   IR ANGIO VERTEBRAL SEL SUBCLAVIAN INNOMINATE BILAT MOD SED  05/11/2021   LOOP RECORDER INSERTION N/A 03/05/2020   Procedure: LOOP RECORDER INSERTION;  Surgeon: Deboraha Sprang, MD;  Location: [place] CV LAB;  Service: Cardiovascular;  Laterality: N/A;   TEE WITHOUT CARDIOVERSION N/A 03/05/2020   Procedure: TRANSESOPHAGEAL ECHOCARDIOGRAM (TEE);  Surgeon: Donato Heinz, MD;  Location: [facility] ENDOSCOPY;  Service: Cardiovascular;  Laterality: N/A;    There were no vitals filed for this visit.   Subjective Assessment - 09/03/21 0854     Subjective No C/O . She reports she will be traveling to [place] for 2 months after 11/23.    Currently in Pain? No/denies                                [facility] Adult PT Treatment/Exercise - 09/03/21 0001       Ambulation/Gait   Ambulation/Gait Yes    Ambulation/Gait Assistance 7: Independent    Ambulation Distance (Feet) 500 Feet    Assistive device None    Gait Pattern Step-through pattern    Ambulation Surface Level    Gait Comments Therapist facilitated increased speed as patien ttends to guard her speed.      Lumbar Exercises: Aerobic   Elliptical L1.2 forward x 2:30 with 2 x 30 fast, L1.2 backwards x 1 minute steady.    Nustep L5 x 6 minutes      Knee/Hip Exercises: Standing   SLS SLS with minimal UE support, Stand on R, moving LLE forward and back as well as across body and out. 4 times each way, occasional CGA for stability.    Other Standing Knee Exercises Jump wiht Abd/add, trying to place R foot on targets ont he floor in order to facilitate more control in power movements.    Other Standing Knee Exercises Attempted shuffling with slightly improved coordiantion, but still much difficulty moving RLE at adequate speed.                     PT Education -  09/03/21 0933     Education Details Updated HEP to focus on speed of movment and control in RLE in stance as well.    Person(s) Educated Patient    Methods Handout;Demonstration;Explanation    Comprehension Verbalized understanding;Returned demonstration              PT Short Term Goals - 08/30/21 1026       PT SHORT TERM GOAL #3   Title I with newly updated HEP    Baseline activities added    Time 1    Period Weeks    Status Achieved    Target Date 08/23/21               PT Long Term Goals - 08/30/21 1028       PT LONG TERM GOAL #1   Title Independent with advanced HEP    Baseline HEP evolving as patient progresses.    Time 9    Period Weeks    Status On-going      PT LONG TERM GOAL #3   Title TUG improved to </= 10 seconds with SPC or independent, no LOB to demonstrate improved functional gait  speed and decreased falls risk    Baseline 13.72 no AD    Time 3    Period Weeks    Status On-going    Target Date 09/15/21      PT LONG TERM GOAL #4   Title Improved BUE and BLE strength to at least 4+/5    Baseline 4/5 throughout, improving    Time 5    Period Weeks    Status On-going    Target Date 09/15/21                   Plan - 09/03/21 0935     Clinical Impression Statement Patient planning on traveling to [place] for 2 months after 11/23. She has prgressed well and will need to continue to update HEP for her last 2 visits, focusing on strenght and stability in RLE as well as coordinated motor control and speed of movment in the limb during functional activities.    Stability/Clinical Decision Making Evolving/Moderate complexity    Clinical Decision Making Moderate    Rehab Potential Good    PT Frequency 2x / week    PT Duration 2 weeks   5w   PT Treatment/Interventions ADLs/Self Care Home Management;Cryotherapy;Electrical Stimulation;Iontophoresis 4mg /ml Dexamethasone;Moist Heat;Functional mobility training;Therapeutic activities;Therapeutic exercise;Balance training;Neuromuscular re-education;Patient/family education;Manual techniques;Energy conservation;Vestibular;Canalith Repostioning;Gait training;Stair training    PT Next Visit Plan UPdate HEP    PT Home Exercise Plan [id number]    Consulted and Agree with Plan of Care Patient             Patient will benefit from skilled therapeutic intervention in order to improve the following deficits and impairments:  Abnormal gait, Decreased range of motion, Difficulty walking, Impaired UE functional use, Increased muscle spasms, Decreased endurance, Pain, Improper body mechanics, Decreased balance, Decreased strength, Decreased mobility  Visit Diagnosis: Other lack of coordination  Other symptoms and signs involving the nervous system  Muscle weakness (generalized)  Unsteadiness on feet  History of CVA  (cerebrovascular accident)  Ataxic gait     Problem List Patient Active Problem List   Diagnosis Date Noted   Elevated liver enzymes 06/28/2021   Iron deficiency anemia 06/10/2021   TIA (transient ischemic attack) 05/09/2021   Chronic diastolic heart failure (grade 1 DD) 05/09/2021   Primary hypertension    Obstructive hydrocephalus ([place])  Tachycardia    Acute cerebral infarction [facility])    Visual disturbance as complication of stroke    Transaminitis    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 coli UTI 11/01/2013   Anemia due to blood loss, acute 10/24/2013   Pelvic fracture (HCC) 10/23/2013   Hyperglycemia 09/18/2013   Routine general medical examination at a health care facility 09/18/2013   Elevated alkaline phosphatase level 09/18/2013   Hot flashes, menopausal 04/10/2013   Pain in joint, upper arm 04/10/2013    Iona Beard, DPT 09/03/2021, 9:39 AM  [facility] [address] [place], [place], [address] Phone: [phone]   Fax:  [phone]  Name: Judy Gutierrez MRN: [id number] Date of Birth: 1960-11-28

## 2021-09-03 NOTE — Telephone Encounter (Signed)
Pt would cholesterol medication changed. She believes it is causing for her to have no taste

## 2021-09-03 NOTE — Telephone Encounter (Signed)
Pt dropped off document to be filled out by provider ( Disability Forms - 4 pages) Pt would like document to be mailed out to address: Lisette Abu [address], [place], [place] [address] or to email to [email or web address]. Document put at front office tray under providers name.

## 2021-09-03 NOTE — Patient Instructions (Signed)
Access Code: [email or web address] URL: [email or web address] Date: 09/03/2021 Prepared by: Oley Balm  Program Notes Jumping at counter- Jump and move feet out, then jump and move back together. Place some tape on the floor to give your R foot targets while jumping.   Exercises Standing March with Counter Support - 1 x daily - 7 x weekly - 1 sets - 5 reps

## 2021-09-06 ENCOUNTER — Encounter: Payer: Self-pay | Admitting: Physical Therapy

## 2021-09-06 ENCOUNTER — Other Ambulatory Visit: Payer: Self-pay

## 2021-09-06 ENCOUNTER — Ambulatory Visit: Payer: No Typology Code available for payment source | Admitting: Physical Therapy

## 2021-09-06 DIAGNOSIS — R2681 Unsteadiness on feet: Secondary | ICD-10-CM

## 2021-09-06 DIAGNOSIS — R278 Other lack of coordination: Secondary | ICD-10-CM

## 2021-09-06 DIAGNOSIS — Z8673 Personal history of transient ischemic attack (TIA), and cerebral infarction without residual deficits: Secondary | ICD-10-CM

## 2021-09-06 DIAGNOSIS — R260 Ataxic gait: Secondary | ICD-10-CM

## 2021-09-06 DIAGNOSIS — M6281 Muscle weakness (generalized): Secondary | ICD-10-CM

## 2021-09-06 NOTE — Patient Instructions (Signed)
Access Code: [email or web address] URL: [email or web address] Date: 09/06/2021 Prepared by: Oley Balm  Exercises Hip Abduction with Resistance Loop - 1 x daily - 7 x weekly - 2 sets - 10 reps Standing Hip Extension Kicks - 1 x daily - 7 x weekly - 2 sets - 10 reps Standing Hip Flexion with Resistance Loop - 1 x daily - 7 x weekly - 2 sets - 10 reps Jumping Jacks - 1 x daily - 7 x weekly - 2 sets - 10 reps Leg Swing Single Leg Balance - 1 x daily - 7 x weekly - 2 sets - 10 reps Single Leg Balance with Four Way Reach and Rotation - 1 x daily - 7 x weekly - 2 sets - 10 reps Jump and Turn 180 - 1 x daily - 7 x weekly - 2 sets - 10 reps

## 2021-09-06 NOTE — Therapy (Signed)
[place]. [place], [place], [address] Phone: [phone]   Fax:  [phone]  Physical Therapy Treatment  Patient Details  Name: Judy Gutierrez MRN: [id number] Date of Birth: 01/08/1961 Referring Provider (PT): Kirsteins   Encounter Date: 09/06/2021   PT End of Session - 09/06/21 1013     Visit Number 30    Date for PT Re-Evaluation 09/15/21    PT Start Time 0927    PT Stop Time [id number]    PT Time Calculation (min) 47 min    Equipment Utilized During Treatment Gait belt    Activity Tolerance Patient tolerated treatment well    Behavior During Therapy WFL for tasks assessed/performed             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)    Stroke ([place])    Unspecified essential hypertension     Past Surgical History:  Procedure Laterality Date   BUBBLE STUDY  03/05/2020   Procedure: BUBBLE STUDY;  Surgeon: Donato Heinz, MD;  Location: [place];  Service: Cardiovascular;;   CESAREAN SECTION  1992   IR ANGIO INTRA EXTRACRAN SEL COM CAROTID INNOMINATE BILAT MOD SED  05/11/2021   IR ANGIO VERTEBRAL SEL SUBCLAVIAN INNOMINATE BILAT MOD SED  05/11/2021   LOOP RECORDER INSERTION N/A 03/05/2020   Procedure: LOOP RECORDER INSERTION;  Surgeon: Deboraha Sprang, MD;  Location: [place] CV LAB;  Service: Cardiovascular;  Laterality: N/A;   TEE WITHOUT CARDIOVERSION N/A 03/05/2020   Procedure: TRANSESOPHAGEAL ECHOCARDIOGRAM (TEE);  Surgeon: Donato Heinz, MD;  Location: [facility] ENDOSCOPY;  Service: Cardiovascular;  Laterality: N/A;    There were no vitals filed for this visit.   Subjective Assessment - 09/06/21 1012     Subjective Patient reports she is performing HEP, no problems.    Patient is accompained by: Family member    Patient Stated Goals  improve balance, strength.    Currently in Pain? No/denies    Pain Onset Today   Pt states she was doing an exercise in PT that was hurting her wrist, but discomfort was improving after cessation of exercise                              [facility] Adult PT Treatment/Exercise - 09/06/21 0001       Lumbar Exercises: Aerobic   Nustep L5 x 6 minutes      Lumbar Exercises: Standing   Other Standing Lumbar Exercises Standing hip abd, ext, flex with each leg, with green theraband resistance, 10 reps each      Knee/Hip Exercises: Plyometrics   Bilateral Jumping 1 set;10 reps    Bilateral Jumping Limitations Jump into abd/add, jump/turn 90 degrees.      Knee/Hip Exercises: Standing   Other Standing Knee Exercises SLS, move opposite leg into flex, abd, ext, ext with rotation.    Other Standing Knee Exercises jump switch feet in stride stance.                     PT Education - 09/06/21 1013     Education Details Updated HEP to focus on fast movments, RLE WB.    Person(s) Educated Patient    Methods Explanation;Demonstration;Handout    Comprehension Verbalized understanding;Returned demonstration  PT Short Term Goals - 08/30/21 1026       PT SHORT TERM GOAL #3   Title I with newly updated HEP    Baseline activities added    Time 1    Period Weeks    Status Achieved    Target Date 08/23/21               PT Long Term Goals - 08/30/21 1028       PT LONG TERM GOAL #1   Title Independent with advanced HEP    Baseline HEP evolving as patient progresses.    Time 9    Period Weeks    Status On-going      PT LONG TERM GOAL #3   Title TUG improved to </= 10 seconds with SPC or independent, no LOB to demonstrate improved functional gait speed and decreased falls risk    Baseline 13.72 no AD    Time 3    Period Weeks    Status On-going    Target Date 09/15/21      PT LONG TERM GOAL #4   Title Improved BUE and BLE strength to at  least 4+/5    Baseline 4/5 throughout, improving    Time 5    Period Weeks    Status On-going    Target Date 09/15/21                   Plan - 09/06/21 1452     Clinical Impression Statement Patient performed updated exercises for HEP designed to strenghten, and facilitate balance, speed of movment and power.    Stability/Clinical Decision Making Evolving/Moderate complexity    Clinical Decision Making Moderate    Rehab Potential Good    PT Frequency 2x / week    PT Duration 2 weeks   5w   PT Treatment/Interventions ADLs/Self Care Home Management;Cryotherapy;Electrical Stimulation;Iontophoresis 4mg /ml Dexamethasone;Moist Heat;Functional mobility training;Therapeutic activities;Therapeutic exercise;Balance training;Neuromuscular re-education;Patient/family education;Manual techniques;Energy conservation;Vestibular;Canalith Repostioning;Gait training;Stair training    PT Next Visit Plan UPdate HEP    PT Home Exercise Plan [id number]    Consulted and Agree with Plan of Care Patient             Patient will benefit from skilled therapeutic intervention in order to improve the following deficits and impairments:  Abnormal gait, Decreased range of motion, Difficulty walking, Impaired UE functional use, Increased muscle spasms, Decreased endurance, Pain, Improper body mechanics, Decreased balance, Decreased strength, Decreased mobility  Visit Diagnosis: Other lack of coordination  Muscle weakness (generalized)  Unsteadiness on feet  History of CVA (cerebrovascular accident)  Ataxic gait     Problem List Patient Active Problem List   Diagnosis Date Noted   Elevated liver enzymes 06/28/2021   Iron deficiency anemia 06/10/2021   TIA (transient ischemic attack) 05/09/2021   Chronic diastolic heart failure (grade 1 DD) 05/09/2021   Primary hypertension    Obstructive hydrocephalus (HCC)    Tachycardia    Acute cerebral infarction [facility])    Visual disturbance as  complication of stroke    Transaminitis    Embolic stroke involving right cerebellar artery ([place]) 02/26/2021   Acute blood loss anemia    History of CVA (cerebrovascular accident)    Benign essential HTN    Prediabetes    Hyponatremia    Acute CVA (cerebrovascular accident) ([place]) 02/23/2021   Hyperlipidemia 06/01/2020   Cryptogenic stroke ([place]) 02/25/2020   Essential hypertension 02/05/2020   Posterior circulation stroke ([place]) 01/30/2020   Cerebellar stroke,  acute (HCC) 01/30/2020   Musculoskeletal chest pain 09/10/2014   Hand pain 02/12/2014   Fracture of left ulna 11/05/2013   E. coli UTI 11/01/2013   Anemia due to blood loss, acute 10/24/2013   Pelvic fracture (HCC) 10/23/2013   Hyperglycemia 09/18/2013   Routine general medical examination at a health care facility 09/18/2013   Elevated alkaline phosphatase level 09/18/2013   Hot flashes, menopausal 04/10/2013   Pain in joint, upper arm 04/10/2013    Iona Beard, DPT 09/06/2021, 2:55 PM  [facility] [address] [place], [place], [address] Phone: [phone]   Fax:  [phone]  Name: Judy Gutierrez MRN: [id number] Date of Birth: April 04, 1961

## 2021-09-07 ENCOUNTER — Encounter: Payer: Self-pay | Admitting: Occupational Therapy

## 2021-09-07 DIAGNOSIS — Z0289 Encounter for other administrative examinations: Secondary | ICD-10-CM

## 2021-09-07 MED ORDER — ROSUVASTATIN CALCIUM 40 MG PO TABS: 40.0000 mg | ORAL_TABLET | Freq: Every day | ORAL | 1 refills | Status: DC

## 2021-09-07 NOTE — Telephone Encounter (Signed)
Forms completed. Pt made aware and requesting copy. Forms will be faxed.

## 2021-09-07 NOTE — Telephone Encounter (Signed)
Pt made aware. Rx sent ?

## 2021-09-07 NOTE — Progress Notes (Signed)
Carelink Summary Report / Loop Recorder 

## 2021-09-07 NOTE — Telephone Encounter (Signed)
Forms have been emailed to the email below

## 2021-09-08 ENCOUNTER — Other Ambulatory Visit: Payer: Self-pay

## 2021-09-08 ENCOUNTER — Encounter: Payer: Self-pay | Admitting: Physical Therapy

## 2021-09-08 ENCOUNTER — Ambulatory Visit: Payer: No Typology Code available for payment source | Admitting: Physical Therapy

## 2021-09-08 DIAGNOSIS — M6281 Muscle weakness (generalized): Secondary | ICD-10-CM

## 2021-09-08 DIAGNOSIS — R278 Other lack of coordination: Secondary | ICD-10-CM | POA: Diagnosis not present

## 2021-09-08 DIAGNOSIS — Z8673 Personal history of transient ischemic attack (TIA), and cerebral infarction without residual deficits: Secondary | ICD-10-CM

## 2021-09-08 DIAGNOSIS — R2681 Unsteadiness on feet: Secondary | ICD-10-CM

## 2021-09-08 DIAGNOSIS — R260 Ataxic gait: Secondary | ICD-10-CM

## 2021-09-08 NOTE — Therapy (Signed)
[place] [place], [place], [address] Phone: [phone]   Fax:  [phone]  September 08, 2021   No Recipients  Physical Therapy Discharge Summary  Patient: Judy Gutierrez  MRN: [id number]  Date of Birth: Sep 10, 1961   Diagnosis: Other lack of coordination  Muscle weakness (generalized)  Unsteadiness on feet  History of CVA (cerebrovascular accident)  Ataxic gait Referring Provider (PT): Kirsteins   The above patient had been seen in Physical Therapy 31 times of 31 treatments scheduled with 0 no shows and 0 cancellations.  The treatment consisted of NM re-education for R side to improve motor control, coordination, speed of movement. The patient is: Improved  Subjective: Patient reports she is not using cane indoors anymore.  Discharge Findings: Patient has progressed well. She still lacks coordinated control of RLE, with poor but improving NM control of limb at high speeds.   Functional Status at Discharge: MI with all mobility except S with gait on uneven services.  Goals Partially Met   Plan - 09/08/21 1055     Clinical Impression Statement D/C PT services. Patient received final HEP with ability to progress challenges for speed of movement and RLE control.    Stability/Clinical Decision Making Evolving/Moderate complexity    Clinical Decision Making Moderate    Rehab Potential Good    PT Frequency 2x / week    PT Duration 2 weeks   5w   PT Treatment/Interventions ADLs/Self Care Home Management;Cryotherapy;Electrical Stimulation;Iontophoresis 50m/ml Dexamethasone;Moist Heat;Functional mobility training;Therapeutic activities;Therapeutic exercise;Balance training;Neuromuscular re-education;Patient/family education;Manual techniques;Energy conservation;Vestibular;Canalith Repostioning;Gait training;Stair training    PT Next Visit Plan UPdate HEP    PT Home Exercise Plan [id number]   Consulted and Agree  with Plan of Care Patient             Sincerely,   SMarcelina Morel DPT   CC No Recipients  [place] [address]Phone: [phone]  Fax:  [phone] Patient: Judy Gutierrez MRN: [id number] Date of Birth: 1Apr 09, 1962

## 2021-09-13 ENCOUNTER — Encounter: Payer: No Typology Code available for payment source | Admitting: Physical Therapy

## 2021-09-15 ENCOUNTER — Telehealth: Payer: Self-pay | Admitting: *Deleted

## 2021-09-15 NOTE — Telephone Encounter (Signed)
Pt disability form faxed and mail copy to pt.

## 2021-10-04 ENCOUNTER — Ambulatory Visit (INDEPENDENT_AMBULATORY_CARE_PROVIDER_SITE_OTHER): Payer: No Typology Code available for payment source

## 2021-10-04 DIAGNOSIS — I639 Cerebral infarction, unspecified: Secondary | ICD-10-CM

## 2021-10-04 LAB — CUP PACEART REMOTE DEVICE CHECK
Date Time Interrogation Session: 20221218230744
Implantable Pulse Generator Implant Date: 20210520

## 2021-10-05 DIAGNOSIS — Z0271 Encounter for disability determination: Secondary | ICD-10-CM

## 2021-10-14 NOTE — Progress Notes (Signed)
Carelink Summary Report / Loop Recorder 

## 2021-11-08 ENCOUNTER — Ambulatory Visit (INDEPENDENT_AMBULATORY_CARE_PROVIDER_SITE_OTHER): Payer: No Typology Code available for payment source

## 2021-11-08 DIAGNOSIS — I639 Cerebral infarction, unspecified: Secondary | ICD-10-CM

## 2021-11-08 LAB — CUP PACEART REMOTE DEVICE CHECK
Date Time Interrogation Session: 20230122230821
Implantable Pulse Generator Implant Date: 20210520

## 2021-11-19 NOTE — Progress Notes (Signed)
Carelink Summary Report / Loop Recorder 

## 2021-12-03 ENCOUNTER — Encounter: Payer: 59 | Attending: Registered Nurse | Admitting: Physical Medicine & Rehabilitation

## 2021-12-03 DIAGNOSIS — I639 Cerebral infarction, unspecified: Secondary | ICD-10-CM | POA: Insufficient documentation

## 2021-12-11 LAB — CUP PACEART REMOTE DEVICE CHECK
Date Time Interrogation Session: 20230224230841
Implantable Pulse Generator Implant Date: 20210520

## 2021-12-13 ENCOUNTER — Ambulatory Visit (INDEPENDENT_AMBULATORY_CARE_PROVIDER_SITE_OTHER): Payer: No Typology Code available for payment source

## 2021-12-13 DIAGNOSIS — I639 Cerebral infarction, unspecified: Secondary | ICD-10-CM

## 2021-12-20 NOTE — Progress Notes (Signed)
Carelink Summary Report / Loop Recorder 

## 2021-12-21 ENCOUNTER — Encounter: Payer: Self-pay | Admitting: Family Medicine

## 2021-12-21 ENCOUNTER — Other Ambulatory Visit: Payer: Self-pay | Admitting: Neurology

## 2021-12-21 ENCOUNTER — Ambulatory Visit (INDEPENDENT_AMBULATORY_CARE_PROVIDER_SITE_OTHER): Payer: 59 | Admitting: Family Medicine

## 2021-12-21 ENCOUNTER — Encounter: Payer: Self-pay | Admitting: Neurology

## 2021-12-21 VITALS — BP 110/80 | HR 86 | Temp 97.3°F | Resp 18 | Ht 62.0 in | Wt 121.8 lb

## 2021-12-21 DIAGNOSIS — I7774 Dissection of vertebral artery: Secondary | ICD-10-CM

## 2021-12-21 DIAGNOSIS — I5032 Chronic diastolic (congestive) heart failure: Secondary | ICD-10-CM

## 2021-12-21 DIAGNOSIS — Z0000 Encounter for general adult medical examination without abnormal findings: Secondary | ICD-10-CM

## 2021-12-21 DIAGNOSIS — Z1211 Encounter for screening for malignant neoplasm of colon: Secondary | ICD-10-CM

## 2021-12-21 DIAGNOSIS — I10 Essential (primary) hypertension: Secondary | ICD-10-CM | POA: Diagnosis not present

## 2021-12-21 DIAGNOSIS — I63441 Cerebral infarction due to embolism of right cerebellar artery: Secondary | ICD-10-CM

## 2021-12-21 DIAGNOSIS — E785 Hyperlipidemia, unspecified: Secondary | ICD-10-CM

## 2021-12-21 LAB — CBC WITH DIFFERENTIAL/PLATELET
Basophils Absolute: 0 10*3/uL (ref 0.0–0.1)
Basophils Relative: 0.5 % (ref 0.0–3.0)
Eosinophils Absolute: 0.1 10*3/uL (ref 0.0–0.7)
Eosinophils Relative: 2.5 % (ref 0.0–5.0)
HCT: 36.3 % (ref 36.0–46.0)
Hemoglobin: 12.3 g/dL (ref 12.0–15.0)
Lymphocytes Relative: 26.5 % (ref 12.0–46.0)
Lymphs Abs: 1.4 10*3/uL (ref 0.7–4.0)
MCHC: 34 g/dL (ref 30.0–36.0)
MCV: 87.9 fl (ref 78.0–100.0)
Monocytes Absolute: 0.3 10*3/uL (ref 0.1–1.0)
Monocytes Relative: 6.2 % (ref 3.0–12.0)
Neutro Abs: 3.5 10*3/uL (ref 1.4–7.7)
Neutrophils Relative %: 64.3 % (ref 43.0–77.0)
Platelets: 273 10*3/uL (ref 150.0–400.0)
RBC: 4.13 Mil/uL (ref 3.87–5.11)
RDW: 13.1 % (ref 11.5–15.5)
WBC: 5.4 10*3/uL (ref 4.0–10.5)

## 2021-12-21 LAB — COMPREHENSIVE METABOLIC PANEL
ALT: 20 U/L (ref 0–35)
AST: 29 U/L (ref 0–37)
Albumin: 4.1 g/dL (ref 3.5–5.2)
Alkaline Phosphatase: 81 U/L (ref 39–117)
BUN: 18 mg/dL (ref 6–23)
CO2: 29 mEq/L (ref 19–32)
Calcium: 9.4 mg/dL (ref 8.4–10.5)
Chloride: 103 mEq/L (ref 96–112)
Creatinine, Ser: 0.58 mg/dL (ref 0.40–1.20)
GFR: 98.43 mL/min (ref >60.00)
Glucose, Bld: 103 mg/dL — ABNORMAL HIGH (ref 70–99)
Potassium: 4.2 mEq/L (ref 3.5–5.1)
Sodium: 137 mEq/L (ref 135–145)
Total Bilirubin: 1.7 mg/dL — ABNORMAL HIGH (ref 0.2–1.2)
Total Protein: 7.4 g/dL (ref 6.0–8.3)

## 2021-12-21 LAB — LIPID PANEL
Cholesterol: 95 mg/dL (ref 0–200)
HDL: 46.6 mg/dL (ref >39.00)
LDL Cholesterol: 39 mg/dL (ref 0–99)
NonHDL: 48.66
Total CHOL/HDL Ratio: 2
Triglycerides: 46 mg/dL (ref 0.0–149.0)
VLDL: 9.2 mg/dL (ref 0.0–40.0)

## 2021-12-21 LAB — TSH: TSH: 1.36 u[IU]/mL (ref 0.35–5.50)

## 2021-12-21 NOTE — Assessment & Plan Note (Signed)
Well controlled, no changes to meds. Encouraged heart healthy diet such as the DASH diet and exercise as tolerated.  °

## 2021-12-21 NOTE — Assessment & Plan Note (Signed)
Tolerating statin, encouraged heart healthy diet, avoid trans fats, minimize simple carbs and saturated fats. Increase exercise as tolerated 

## 2021-12-21 NOTE — Patient Instructions (Signed)

## 2021-12-21 NOTE — Progress Notes (Addendum)
Subjective:     Judy Gutierrez is a 61 y.o. female and is here for a comprehensive physical exam. The patient reports no problems.  Social History   Socioeconomic History   Marital status: Married    Spouse name: Not on file   Number of children: 1   Years of education: Not on file   Highest education level: Not on file  Occupational History   Occupation: convenience store    Comment: self employed  Tobacco Use   Smoking status: Never   Smokeless tobacco: Never  Substance and Sexual Activity   Alcohol use: No   Drug use: No   Sexual activity: Yes  Other Topics Concern   Not on file  Social History Narrative   Lives with husband   Right Handed   Drinks caffeine seldom   Social Determinants of Health   Financial Resource Strain: Not on file  Food Insecurity: Not on file  Transportation Needs: Not on file  Physical Activity: Not on file  Stress: Not on file  Social Connections: Not on file  Intimate Partner Violence: Not on file   Health Maintenance  Topic Date Due   Zoster Vaccines- Shingrix (2 of 2) 09/21/2018   COVID-19 Vaccine (4 - Booster for Pfizer series) 10/21/2020   INFLUENZA VACCINE  05/17/2022   PAP SMEAR-Modifier  05/14/2023   MAMMOGRAM  08/17/2023   Fecal DNA (Cologuard)  01/05/2025   TETANUS/TDAP  06/28/2028   Hepatitis C Screening  Completed   HIV Screening  Completed   HPV VACCINES  Aged Out    The following portions of the patient's history were reviewed and updated as appropriate: She  has a past medical history of Disturbance of skin sensation, Myalgia and myositis, unspecified, Other B-complex deficiencies, Other disorders of bone and cartilage(733.99), Stroke (HCC), and Unspecified essential hypertension. She does not have any pertinent problems on file. She  has a past surgical history that includes Cesarean section (1992); LOOP RECORDER INSERTION (N/A, 03/05/2020); TEE without cardioversion (N/A, 03/05/2020); Bubble study (03/05/2020); IR ANGIO  VERTEBRAL SEL SUBCLAVIAN INNOMINATE BILAT MOD SED (05/11/2021); and IR ANGIO INTRA EXTRACRAN SEL COM CAROTID INNOMINATE BILAT MOD SED (05/11/2021). Her family history includes Breast cancer (age of onset: 66) in her sister; Diabetes in her father; Diabetes Mellitus II in her sister; Hepatitis in her father; Pulmonary embolism in her mother. She  reports that she has never smoked. She has never used smokeless tobacco. She reports that she does not drink alcohol and does not use drugs. She has a current medication list which includes the following prescription(s): amlodipine, aspirin, calcium carbonate-vit d-min, and rosuvastatin. Current Outpatient Medications on File Prior to Visit  Medication Sig Dispense Refill   amLODipine (NORVASC) 5 MG tablet Take 1 tablet (5 mg total) by mouth daily. 90 tablet 3   aspirin 81 MG EC tablet Take 1 tablet (81 mg total) by mouth daily. 90 tablet 1   Calcium Carbonate-Vit D-Min (CALCIUM 1200 PO) Take 1 tablet by mouth daily.     rosuvastatin (CRESTOR) 40 MG tablet Take 1 tablet (40 mg total) by mouth daily. 90 tablet 1   No current facility-administered medications on file prior to visit.   She has No Known Allergies..  Review of Systems Review of Systems  Constitutional: Negative for activity change, appetite change and fatigue.  HENT: Negative for hearing loss, congestion, tinnitus and ear discharge.  dentist q87m Eyes: Negative for visual disturbance (see optho q1y -- vision corrected to 20/20 with glasses).  Respiratory: Negative for cough, chest tightness and shortness of breath.   Cardiovascular: Negative for chest pain, palpitations and leg swelling.  Gastrointestinal: Negative for abdominal pain, diarrhea, constipation and abdominal distention.  Genitourinary: Negative for urgency, frequency, decreased urine volume and difficulty urinating.  Musculoskeletal: Negative for back pain, arthralgias and gait problem.  Skin: Negative for color change, pallor  and rash.  Neurological: Negative for dizziness, light-headedness, numbness and headaches.  Hematological: Negative for adenopathy. Does not bruise/bleed easily.  Psychiatric/Behavioral: Negative for suicidal ideas, confusion, sleep disturbance, self-injury, dysphoric mood, decreased concentration and agitation.     Objective:    BP 110/80 (BP Location: Right Arm, Patient Position: Sitting, Cuff Size: Normal)   Pulse 86   Temp (!) 97.3 F (36.3 C) (Oral)   Resp 18   Ht 5\' 2"  (1.575 m)   Wt 121 lb 12.8 oz (55.2 kg)   LMP 12/08/2010   SpO2 99%   BMI 22.28 kg/m  General appearance: alert, cooperative, appears stated age, and no distress Head: Normocephalic, without obvious abnormality, atraumatic Eyes: conjunctivae/corneas clear. PERRL, EOM's intact. Fundi benign. Ears: normal TM's and external ear canals both ears Nose: Nares normal. Septum midline. Mucosa normal. No drainage or sinus tenderness. Throat: lips, mucosa, and tongue normal; teeth and gums normal Neck: no adenopathy, no carotid bruit, no JVD, supple, symmetrical, trachea midline, and thyroid not enlarged, symmetric, no tenderness/mass/nodules Back: symmetric, no curvature. ROM normal. No CVA tenderness. Lungs: clear to auscultation bilaterally Heart: regular rate and rhythm, S1, S2 normal, no murmur, click, rub or gallop Abdomen: soft, non-tender; bowel sounds normal; no masses,  no organomegaly Extremities: extremities normal, atraumatic, no cyanosis or edema Pulses: 2+ and symmetric Skin: Skin color, texture, turgor normal. No rashes or lesions Lymph nodes: Cervical, supraclavicular, and axillary nodes normal. Neurologic: ataxic gait , wide based    Assessment:    Healthy female exam.      Plan:    Ghm utd Check labs  See After Visit Summary for Counseling Recommendations   1. Preventative health care See above  - Comprehensive metabolic panel - Lipid panel - CBC with Differential/Platelet - TSH  2.  Primary hypertension Well controlled, no changes to meds. Encouraged heart healthy diet such as the DASH diet and exercise as tolerated.    3. Hyperlipidemia, unspecified hyperlipidemia type Tolerating statin, encouraged heart healthy diet, avoid trans fats, minimize simple carbs and saturated fats. Increase exercise as tolerated   4. 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 Chronic diastolic (congestive) heart failure [facility]) Per cardiology  7. Essential hypertension Well controlled, no changes to meds. Encouraged heart healthy diet such as the DASH diet and exercise as tolerated.    8. 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

## 2021-12-21 NOTE — Assessment & Plan Note (Signed)
With R sided weakness----- weakness has improved a lot but not 100% ?

## 2021-12-23 ENCOUNTER — Telehealth: Payer: Self-pay | Admitting: Neurology

## 2021-12-23 ENCOUNTER — Other Ambulatory Visit: Payer: Self-pay | Admitting: Neurology

## 2021-12-23 DIAGNOSIS — I7774 Dissection of vertebral artery: Secondary | ICD-10-CM

## 2021-12-23 NOTE — Telephone Encounter (Signed)
[phone] & [phone] (exp. 12/23/21 to 02/06/22) order sent to GI, they will reach out to the patient to schedule.  ?

## 2021-12-24 ENCOUNTER — Encounter: Payer: Self-pay | Admitting: Family Medicine

## 2022-01-12 ENCOUNTER — Telehealth: Payer: Self-pay | Admitting: Family Medicine

## 2022-01-12 NOTE — Telephone Encounter (Signed)
Pt came in and brought forms to be filled out by [facility] ? ?Placed in [place] bin up front ? ?Patient would like it to be faxed [phone] ? ?&  ? ?ALSO the original copy mailed to home address ?

## 2022-01-13 ENCOUNTER — Ambulatory Visit
Admission: RE | Admit: 2022-01-13 | Discharge: 2022-01-13 | Disposition: A | Discharge status: Home / Self Care | Payer: 59 | Source: Ambulatory Visit | Attending: Neurology | Admitting: Neurology

## 2022-01-13 ENCOUNTER — Encounter: Payer: Self-pay | Admitting: Neurology

## 2022-01-13 DIAGNOSIS — Z0289 Encounter for other administrative examinations: Secondary | ICD-10-CM

## 2022-01-13 DIAGNOSIS — I7774 Dissection of vertebral artery: Secondary | ICD-10-CM

## 2022-01-13 MED ORDER — IOPAMIDOL (ISOVUE-370) INJECTION 76%
75.0000 mL | Freq: Once | INTRAVENOUS | Status: AC | PRN
  Administered 2022-01-13: 75 mL via INTRAVENOUS

## 2022-01-14 NOTE — Telephone Encounter (Signed)
Dr [name] is in the hospital today and I am reviewing the results of your angiogram, Judy Gutierrez. ?You have a right side cerebellar stroke .The stroke is related to a dissection of the right vertebral artery. The images have not shown any difference to prior CT angio.  ?Your gait disturbance is related to the stroke , a cerebellar lesion affects balance and coordination.  ? ?Melvyn Novas, MD  ?  ? ?Quoted results:  ?Chronic cerebellar infarct right greater than left. No change ?from the prior study and no acute intracranial abnormality ?2. 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 No significant stenosis. No change from the prior CT ?angio. ? ?I will send a cc to your primary physician and neurologist.  ?

## 2022-01-15 LAB — COLOGUARD: COLOGUARD: NEGATIVE

## 2022-01-17 ENCOUNTER — Ambulatory Visit (INDEPENDENT_AMBULATORY_CARE_PROVIDER_SITE_OTHER): Payer: No Typology Code available for payment source

## 2022-01-17 ENCOUNTER — Encounter: Payer: Self-pay | Admitting: *Deleted

## 2022-01-17 DIAGNOSIS — I639 Cerebral infarction, unspecified: Secondary | ICD-10-CM

## 2022-01-18 LAB — CUP PACEART REMOTE DEVICE CHECK
Date Time Interrogation Session: 20230331230537
Implantable Pulse Generator Implant Date: 20210520

## 2022-01-20 NOTE — Telephone Encounter (Signed)
Received

## 2022-01-25 NOTE — Telephone Encounter (Signed)
Spoke with patient and she stated that disability forms are same as last time but just different company.  Forms given to provider today. ? ?

## 2022-02-01 NOTE — Telephone Encounter (Signed)
Late documentation: Forms were faxed 01/28/2022 ?

## 2022-02-01 NOTE — Progress Notes (Signed)
Carelink Summary Report / Loop Recorder 

## 2022-02-17 ENCOUNTER — Ambulatory Visit (INDEPENDENT_AMBULATORY_CARE_PROVIDER_SITE_OTHER): Payer: 59 | Admitting: Neurology

## 2022-02-17 ENCOUNTER — Encounter: Payer: Self-pay | Admitting: Neurology

## 2022-02-17 VITALS — BP 118/69 | HR 87 | Ht 62.0 in | Wt 120.8 lb

## 2022-02-17 DIAGNOSIS — I7774 Dissection of vertebral artery: Secondary | ICD-10-CM

## 2022-02-17 DIAGNOSIS — I6789 Other cerebrovascular disease: Secondary | ICD-10-CM | POA: Diagnosis not present

## 2022-02-17 DIAGNOSIS — R270 Ataxia, unspecified: Secondary | ICD-10-CM | POA: Diagnosis not present

## 2022-02-17 NOTE — Progress Notes (Signed)
?[facility] ?[address] ?[place]. [place] [address] ?[phone] ? ?     OFFICE FOLLOW-UP NOTE ? ?Ms. Trenda Manuella Ghazi ?Date of Birth:  [birth year] ?Medical Record Number:  [id number]  ? ?HPI: Initial visit 03/04/2020  :Judy Gutierrez is a 61 year old [place] lady seen today for initial office follow-up visit following hospital consultation for stroke in April 2021.  She is accompanied by her husband.  History is obtained from them, review of electronic medical records and I personally reviewed imaging films in PACS.  She presented to [facility] emergency room with sudden onset of dizziness, diplopia, gait imbalance and vomiting.  She was seen by telemetry neurologist and given IV TPA posterior circulation stroke.  She was transferred to [facility].  Initial CT scan of the head was unremarkable but MRI scan showed a small left superior cerebellar as well as medial midbrain infarct.  2D echo showed normal ejection fraction without cardiac source of embolism.  Cardiac monitoring during hospitalization did not show any paroxysmal A. fib.  Transcranial Doppler bubble study was negative for PFO.  LDL cholesterol is elevated 138 mg percent.  Hemoglobin A1c was borderline at 6.0.  Hypercoagulable labs and vasculitic labs were negative.  CT angiogram of the brain and neck showed no significant large vessel stenosis in the neck of the brain.  Patient was discharged home on aspirin and Plavix for 3 weeks and now is currently on aspirin alone.  Patient states she is doing well.  She is getting home physical and occupational therapy.  Her dizziness and gait imbalance have improved completely.  Double vision is also gone.  She had called the office few weeks ago complaining of eye twitching and some numbness on the right side and was wondering if this was related to the transcranial Doppler study that she had had.  The symptoms also have gone.  She is scheduled to undergo TEE  tomorrow followed by loop recorder.  She is tolerating aspirin well without bruising or bleeding.  She is also tolerating Lipitor well without muscle aches and pains.  Her blood pressure is well controlled.  She ran out of Cozaar  3 days ago but yet her blood pressure today is fine at 128/83. ?Update 08/19/2020 : She returns for follow-up after last visit 6 months ago.  She continues to do well.  She has had no recurrent stroke or TIA symptoms.  She is tolerating aspirin well without bruising or bleeding.  She remains on Lipitor and tolerating it well without muscle aches and pains.  She had follow-up lipid profile checked on 06/01/2020 and LDL cholesterol was 51 mg percent.  She had TEE done on 03/05/2020 which was unremarkable and she underwent loop recorder insertion.  So far monthly analysis has shown few episodes of transient A. fib which were felt to be artifact but no real A. fib was found.  She states she is recovered completely back to normal.  She has no complaints.  Her blood pressures well controlled today it is borderline at 134/68.  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 ?Update 05/11/2021 : Patient is seen emergently today for follow-up following recent admission for TIA.  She states that on 05/09/2021 when she bent down she noticed sudden onset of numbness involving left face as well as some dizziness and imbalance.  This did not last long and recovered.  She was admitted to  the hospital for TIA work-up and MRI scan of the brain did not show an acute stroke and encephalomalacia in bilateral cerebellum from a previous stroke.  CT angiogram of brain and neck was both unremarkable however she underwent diagnostic catheter angiogram today by Dr. 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 ?2.Focal circumferential narrowing of  the Lt VA at the level of  the dens also suspicious for a non flow limiting dissection..  Patient was on aspirin prior to admission and Plavix was added and she was discharged.  She has finished home physical occupational therapy and is planning to start outpatient therapy soon.  She states her dizziness and speech have improved.  Her strength is also a lot better with her balance and gait not back to baseline.  She is able to walk with a cane by herself but has to walk slowly and the right leg is stiff and slow.  She has had no falls or injuries.  She is also on amlodipine and blood pressure is under good control and Lipitor which is tolerating well without muscle aches and pains.  Her LDL was 41 mg percent and hemoglobin A1c 6.1 on 05/09/2021, she has a loop recorder in situ and so far paroxysmal A. fib has not yet been found  ?Update 08/19/2021: She returns for follow-up after last visit 3 months ago.  She is accompanied by husband.  Patient continues to have mild right hand diminished coordination and dexterity as well as some gait and balance difficulties.  She is able to walk short distances indoors without a cane but prefers using cane for long distances.  She has had no falls or injuries.  She is finished outpatient occupational therapy with has been doing hand exercises regularly at home.  She is still doing outpatient physical therapy but has to pay out-of-pocket.  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  She does get tired easily.  She is tolerating aspirin well without bleeding or bruising.  She states her blood pressure is under good control.  She is remains on Lipitor which is tolerating well without muscle aches and pain.  She had lipid profile checked a month ago by primary care physician and it was satisfactory.  She is planning on going on a trip to [place] at the end of this month.  She has no new complaints. ?Update 02/17/2022 : She returns  for follow-up after last visit 6 months ago.  She is accompanied by husband.  Patient continues to have some diminished fine motor skills in her hands and particularly bending during and little finger.  Her gait and balance have improved and she stopped using a cane now.  She still tends to drag her right leg which is stiff.  She has difficulties and fine motor skills like combing her hair.  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  She had lipid profile and hemoglobin A1c checked by primary physician 2 months ago and both were satisfactory.  She is tolerating Crestor well without side effects.  Blood pressures under good control today it is 11/69.  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  No significant flow-limiting stenosis with good distal flow.  Patient has no new complaints today ?ROS:   ?14 system review of systems is positive for dizziness, imbalance, gait difficulty, diminished fine motor skills and all other systems negative and all systems negative ?PMH:  ?Past Medical History:  ?Diagnosis Date  ? Disturbance of skin sensation   ? Myalgia and myositis, unspecified   ? Other B-complex deficiencies   ? Other disorders of bone and cartilage(733.99)   ? Stroke [facility])   ? Unspecified essential hypertension   ? ? ?Social History:  ?Social History  ? ?Socioeconomic History  ? Marital status: Married  ?  Spouse name: Not on file  ? Number of children: 1  ? Years of education: Not on file  ? Highest education level: Not on file  ?Occupational History  ? Occupation: Environmental consultant  ?  Comment: self employed  ?Tobacco Use  ? Smoking status: Never  ? Smokeless tobacco: Never  ?Substance and Sexual Activity  ? Alcohol use: No  ? Drug use: No  ? Sexual  activity: Yes  ?Other Topics Concern  ? Not on file  ?Social History Narrative  ? Lives with husband  ? Right Handed  ? Drinks caffeine seldom  ? ?Social Determinants of Health  ? ?Chief Financial Officer

## 2022-02-17 NOTE — Patient Instructions (Signed)
I had a long d/w patient and her husband about her recent stroke,vertebral artery dissection, risk for recurrent stroke/TIAs, personally independently reviewed imaging studies and stroke evaluation results and answered questions.Continue aspirin 81 mg daily  for secondary stroke prevention and maintain strict control of hypertension with blood pressure goal below 130/90, diabetes with hemoglobin A1c goal below 6.5% and lipids with LDL cholesterol goal below 70 mg/dL. I also advised the patient to eat a healthy diet with plenty of whole grains, cereals, fruits and vegetables, exercise regularly and maintain ideal body weight . she may start driving cautiously.  Followup in the future with me in 6 months or call earlier if necessary. ?

## 2022-02-18 ENCOUNTER — Telehealth: Payer: Self-pay | Admitting: Family Medicine

## 2022-02-18 NOTE — Telephone Encounter (Signed)
Pt called stating that she had seen her neurologist recently and that the BP meds she is currently on may be causing her symptoms not issues with her brain. Pt would like to look into changing what BP medication she is on in order to alleviate those symptoms. Please Advise. ?

## 2022-02-22 NOTE — Telephone Encounter (Signed)
Pt called. LVM to return call 

## 2022-02-22 NOTE — Telephone Encounter (Signed)
Spoke with patient. Pt states still doesn't have taste and advised that her neurologist advised changing her blood pressure medicine to see if her taste returns. Please advise ?

## 2022-02-22 NOTE — Telephone Encounter (Signed)
Pt is calling back regarding her message sent on 05/05. She would like a call back as soon as possible. Please advise.  ?

## 2022-02-23 MED ORDER — LOSARTAN POTASSIUM 50 MG PO TABS: 50.0000 mg | ORAL_TABLET | Freq: Every day | ORAL | 2 refills | Status: DC

## 2022-02-23 NOTE — Telephone Encounter (Signed)
Spoke with patient. Pt verbalized understanding. New rx sent in. Pt states she will send in blood pressures via Mychart. Pt can not come at this time for a nurse visit ?

## 2022-03-09 ENCOUNTER — Telehealth: Payer: Self-pay | Admitting: Family Medicine

## 2022-03-09 NOTE — Telephone Encounter (Signed)
Pt is needing an order for her second shingles shot.

## 2022-03-09 NOTE — Telephone Encounter (Signed)
Pt needs a nurse visit for 2nd shingles vaccine please

## 2022-03-10 NOTE — Telephone Encounter (Signed)
Correct. Pt will have to start over with the new vaccine. Just needs a nurse visit

## 2022-03-10 NOTE — Telephone Encounter (Signed)
Called patient and stated we could get her in for 2nd vaccine Patient said "nooo I need to restart. I had the first one a long time ago"  Please advise

## 2022-03-15 ENCOUNTER — Telehealth (HOSPITAL_COMMUNITY): Payer: Self-pay

## 2022-03-15 NOTE — Telephone Encounter (Signed)
-----   Message from Willette Brace, PA-C sent at 02/14/2022  3:31 PM EDT ----- Regarding: RE: follow-up Ash,  Dr. Corliss Skains states that CTA head was suboptimal, he really cannot assess the area of interest.  He mentioned that the patient was supposed to have diagnostic angio but she never schedule that?  For now, patient and her neurologist can discuss if they want Dr. Corliss Skains to do intervention for the patient.  Thank you,  Aimee   ----- Message ----- From: Anderson Malta Sent: 02/14/2022   1:43 PM EDT To: Willette Brace, PA-C, Shirlyn Goltz Subject: follow-up                                      Aimee,   Ms. Derryberry had her f/u cta done in March. Please review with Dev.   Thanks,  Fara Boros

## 2022-03-16 ENCOUNTER — Ambulatory Visit (INDEPENDENT_AMBULATORY_CARE_PROVIDER_SITE_OTHER): Payer: 59

## 2022-03-16 DIAGNOSIS — Z23 Encounter for immunization: Secondary | ICD-10-CM

## 2022-03-16 NOTE — Progress Notes (Signed)
Pt here for 1st Shingles vaccine per [place]. Vaccine given right deltoid. Pt handled well and given info sheet. Pt scheduled 2nd vaccine in 2 months.

## 2022-04-06 ENCOUNTER — Encounter: Payer: Self-pay | Admitting: Family Medicine

## 2022-04-13 NOTE — Telephone Encounter (Signed)
Patient called back and was informed that Lipitor would be sent to her pharmacy. She stated she would wait for the medication and also asked for an update on the weight management question. Please advise.

## 2022-04-14 ENCOUNTER — Encounter: Payer: Self-pay | Admitting: Neurology

## 2022-04-14 MED ORDER — ATORVASTATIN CALCIUM 40 MG PO TABS: 40.0000 mg | ORAL_TABLET | Freq: Every evening | ORAL | 2 refills | Status: DC

## 2022-04-14 NOTE — Addendum Note (Signed)
Addended by: Kathi Ludwig on: 04/14/2022 02:22 PM   Modules accepted: Orders

## 2022-04-14 NOTE — Telephone Encounter (Addendum)
I spoke to the patient. She had a loop recorder placed 03/05/20. Per the patient, she received an email from [employer], four days ago, stating the device is no longer recording. She would like to know if she can have it removed since she has had it 2+ years.   She is aware Dr. [name] is out of the office this week. She would like for him to review this information when he returns.  She would prefer him to send his reply back through her mychart portal.

## 2022-04-15 ENCOUNTER — Other Ambulatory Visit: Payer: Self-pay

## 2022-04-15 MED ORDER — ATORVASTATIN CALCIUM 40 MG PO TABS: 40.0000 mg | ORAL_TABLET | Freq: Every evening | ORAL | 2 refills | Status: AC

## 2022-04-20 ENCOUNTER — Telehealth: Payer: Self-pay | Admitting: Internal Medicine

## 2022-04-20 NOTE — Telephone Encounter (Signed)
This pt has been advised by device clinic RN.  Please see telephone encounter dated 04/20/2022 for complete details

## 2022-04-20 NOTE — Telephone Encounter (Signed)
Pt states that her neurologist told her to contact Dr. Graciela Husbands about her loop recorder. She states that it is not connected. Contacted device clinic and they stated that it is connected and working.    She also wants to know if she should keep the loop recorder in because she had a brain stroke last year.

## 2022-04-20 NOTE — Telephone Encounter (Signed)
Returned call to Pt.  Advised that her loop recorder is working normally.  Advised that the recommendation is for her to keep the loop in as long as the device is working to continue to monitor for atrial fibrillation.  Pt indicates understanding.

## 2022-04-21 ENCOUNTER — Telehealth: Payer: Self-pay

## 2022-04-21 NOTE — Telephone Encounter (Signed)
Error

## 2022-04-25 ENCOUNTER — Ambulatory Visit (INDEPENDENT_AMBULATORY_CARE_PROVIDER_SITE_OTHER): Payer: 59

## 2022-04-25 DIAGNOSIS — I639 Cerebral infarction, unspecified: Secondary | ICD-10-CM

## 2022-04-26 LAB — CUP PACEART REMOTE DEVICE CHECK
Date Time Interrogation Session: 20230708230628
Implantable Pulse Generator Implant Date: 20210520

## 2022-04-28 DIAGNOSIS — Z0271 Encounter for disability determination: Secondary | ICD-10-CM

## 2022-05-17 ENCOUNTER — Telehealth: Payer: Self-pay | Admitting: Family Medicine

## 2022-05-17 ENCOUNTER — Ambulatory Visit (INDEPENDENT_AMBULATORY_CARE_PROVIDER_SITE_OTHER): Payer: 59

## 2022-05-17 DIAGNOSIS — Z23 Encounter for immunization: Secondary | ICD-10-CM | POA: Diagnosis not present

## 2022-05-17 NOTE — Progress Notes (Signed)
[name] is a 61 y.o. female presents to the office today for  # 2 Shingles injections, per physician's orders. Original order:  Shingles (med),  (dose),  Left Deltiod(route) was administered  (location) today. Patient tolerated injection. Patient due for follow up labs/provider appt:  Patient next injection due:   Judy Gutierrez

## 2022-05-17 NOTE — Telephone Encounter (Signed)
Pt cam in for nv and dropped off paper for pcp to fill out. Paperwork placed in folder up front.

## 2022-05-17 NOTE — Telephone Encounter (Signed)
Form partially completed and placed in folder

## 2022-05-18 NOTE — Progress Notes (Signed)
Carelink Summary Report / Loop Recorder 

## 2022-05-18 NOTE — Telephone Encounter (Signed)
Form completed and faxed. 

## 2022-05-24 ENCOUNTER — Telehealth: Payer: Self-pay

## 2022-05-24 NOTE — Telephone Encounter (Signed)
Attending physician statement has been completed and signed by Dr. [name].  I have placed in medical records outbox for processing.

## 2022-05-27 ENCOUNTER — Ambulatory Visit (INDEPENDENT_AMBULATORY_CARE_PROVIDER_SITE_OTHER): Payer: 59

## 2022-05-27 DIAGNOSIS — I639 Cerebral infarction, unspecified: Secondary | ICD-10-CM | POA: Diagnosis not present

## 2022-05-27 LAB — CUP PACEART REMOTE DEVICE CHECK
Date Time Interrogation Session: 20230811104241
Implantable Pulse Generator Implant Date: 20210520

## 2022-06-21 ENCOUNTER — Ambulatory Visit: Payer: 59 | Admitting: Family Medicine

## 2022-06-21 NOTE — Progress Notes (Signed)
Carelink Summary Report / Loop Recorder 

## 2022-06-23 ENCOUNTER — Ambulatory Visit (INDEPENDENT_AMBULATORY_CARE_PROVIDER_SITE_OTHER): Payer: 59 | Admitting: Neurology

## 2022-06-23 ENCOUNTER — Encounter: Payer: Self-pay | Admitting: Neurology

## 2022-06-23 VITALS — BP 118/76 | HR 82 | Ht 62.0 in | Wt 119.8 lb

## 2022-06-23 DIAGNOSIS — R269 Unspecified abnormalities of gait and mobility: Secondary | ICD-10-CM

## 2022-06-23 DIAGNOSIS — Z8673 Personal history of transient ischemic attack (TIA), and cerebral infarction without residual deficits: Secondary | ICD-10-CM

## 2022-06-23 NOTE — Progress Notes (Signed)
[facility] [address] [place] [address] [phone]       OFFICE FOLLOW-UP NOTE  [name] Date of Birth:  09-23-1961 Medical Record Number:  [id number]   HPI: Initial visit 03/04/2020  :Ms. Hemler is a 61 year old [place] origin lady seen today for initial office follow-up visit following hospital consultation for stroke in April 2021.  She is accompanied by her husband.  History is obtained from them, review of electronic medical records and I personally reviewed imaging films in PACS.  She presented to med [facility] emergency room with sudden onset of dizziness, diplopia, gait imbalance and vomiting.  She was seen by telemetry neurologist and given IV TPA posterior circulation stroke.  She was transferred to [facility].  Initial CT scan of the head was unremarkable but MRI scan showed a small left superior cerebellar as well as medial midbrain infarct.  2D echo showed normal ejection fraction without cardiac source of embolism.  Cardiac monitoring during hospitalization did not show any paroxysmal A. fib.  Transcranial Doppler bubble study was negative for PFO.  LDL cholesterol is elevated 138 mg percent.  Hemoglobin A1c was borderline at 6.0.  Hypercoagulable labs and vasculitic labs were negative.  CT angiogram of the brain and neck showed no significant large vessel stenosis in the neck of the brain.  Patient was discharged home on aspirin and Plavix for 3 weeks and now is currently on aspirin alone.  Patient states she is doing well.  She is getting home physical and occupational therapy.  Her dizziness and gait imbalance have improved completely.  Double vision is also gone.  She had called the office few weeks ago complaining of eye twitching and some numbness on the right side and was wondering if this was related to the transcranial Doppler study that she had had.  The symptoms also have gone.  She is scheduled to undergo TEE  tomorrow followed by loop recorder.  She is tolerating aspirin well without bruising or bleeding.  She is also tolerating Lipitor well without muscle aches and pains.  Her blood pressure is well controlled.  She ran out of Cozaar  3 days ago but yet her blood pressure today is fine at 128/83. Update 08/19/2020 : She returns for follow-up after last visit 6 months ago.  She continues to do well.  She has had no recurrent stroke or TIA symptoms.  She is tolerating aspirin well without bruising or bleeding.  She remains on Lipitor and tolerating it well without muscle aches and pains.  She had follow-up lipid profile checked on 06/01/2020 and LDL cholesterol was 51 mg percent.  She had TEE done on 03/05/2020 which was unremarkable and she underwent loop recorder insertion.  So far monthly analysis has shown few episodes of transient A. fib which were felt to be artifact but no real A. fib was found.  She states she is recovered completely back to normal.  She has no complaints.  Her blood pressures well controlled today it is borderline at 134/68.  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 Update 05/11/2021 : Patient is seen emergently today for follow-up following recent admission for TIA.  She states that on 05/09/2021 when she bent down she noticed sudden onset of numbness involving left face as well as some dizziness and imbalance.  This did not last long and recovered.  She was admitted to  the hospital for TIA work-up and MRI scan of the brain did not show an acute stroke and encephalomalacia in bilateral cerebellum from a previous stroke.  CT angiogram of brain and neck was both unremarkable however she underwent diagnostic catheter angiogram today by Dr. 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 2.Focal circumferential narrowing of  the Lt VA at the level of  the dens also suspicious for a non flow limiting dissection..  Patient was on aspirin prior to admission and Plavix was added and she was discharged.  She has finished home physical occupational therapy and is planning to start outpatient therapy soon.  She states her dizziness and speech have improved.  Her strength is also a lot better with her balance and gait not back to baseline.  She is able to walk with a cane by herself but has to walk slowly and the right leg is stiff and slow.  She has had no falls or injuries.  She is also on amlodipine and blood pressure is under good control and Lipitor which is tolerating well without muscle aches and pains.  Her LDL was 41 mg percent and hemoglobin A1c 6.1 on 05/09/2021, she has a loop recorder in situ and so far paroxysmal A. fib has not yet been found  Update 08/19/2021: She returns for follow-up after last visit 3 months ago.  She is accompanied by husband.  Patient continues to have mild right hand diminished coordination and dexterity as well as some gait and balance difficulties.  She is able to walk short distances indoors without a cane but prefers using cane for long distances.  She has had no falls or injuries.  She is finished outpatient occupational therapy with has been doing hand exercises regularly at home.  She is still doing outpatient physical therapy but has to pay out-of-pocket.  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  She does get tired easily.  She is tolerating aspirin well without bleeding or bruising.  She states her blood pressure is under good control.  She is remains on Lipitor which is tolerating well without muscle aches and pain.  She had lipid profile checked a month ago by primary care physician and it was satisfactory.  She is planning on going on a trip to [place] at the end of this month.  She has no new complaints. Update 02/17/2022 : She returns  for follow-up after last visit 6 months ago.  She is accompanied by husband.  Patient continues to have some diminished fine motor skills in her hands and particularly bending during and little finger.  Her gait and balance have improved and she stopped using a cane now.  She still tends to drag her right leg which is stiff.  She has difficulties and fine motor skills like combing her hair.  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  She had lipid profile and hemoglobin A1c checked by primary physician 2 months ago and both were satisfactory.  She is tolerating Crestor well without side effects.  Blood pressures under good control today it is 11/69.  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  No significant flow-limiting stenosis with good distal flow.  Patient has no new complaints today Update 06/23/2022 : She returns for follow-up after last visit 4 months ago.  She is accompanied by her husband.  Patient states she is doing well.  She still has some mild right leg weakness and incoordination and some gait imbalance but uses a cane.  She has had no falls or injuries.  She remains on aspirin 81 mg daily which is tolerating well without bruising or side effects.  She is also tolerating Lipitor 40 mg well without muscle aches or pains.  Her blood pressure is under good control and today it is 118/76.  She still has absence of taste and wonders if this was caused by the stroke or her medications.  Last lipid profile checked on 12/21/2021 was optimal at LDL cholesterol 39 mg percent.  She has no new complaints today. ROS:   14 system review of systems is positive for dizziness, imbalance, gait difficulty, diminished fine motor skills and all other systems  negative and all systems negative PMH:  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)    Stroke (HCC)    Unspecified essential hypertension     Social History:  Social History   Socioeconomic History   Marital status: Married    Spouse name: Not on file   Number of children: 1   Years of education: Not on file   Highest education level: Not on file  Occupational History   Occupation: Science writer    Comment: self employed  Tobacco Use   Smoking status: Never   Smokeless tobacco: Never  Substance and Sexual Activity   Alcohol use: No   Drug use: No   Sexual activity: Yes  Other Topics Concern   Not on file  Social History Narrative   Lives with husband   Right Handed   Drinks caffeine seldom   Social Determinants of Health   Financial Resource Strain: Not on file  Food Insecurity: Not on file  Transportation Needs: No Transportation Needs (02/18/2020)   PRAPARE - Administrator, Civil Service (Medical): No    Lack of Transportation (Non-Medical): No  Physical Activity: Not on file  Stress: Not on file  Social Connections: Not on file  Intimate Partner Violence: Not on file    Medications:   Current Outpatient Medications on File Prior to Visit  Medication Sig Dispense Refill   aspirin 81 MG EC tablet Take 1 tablet (81 mg total) by mouth daily. 90 tablet 1   atorvastatin (LIPITOR) 40 MG tablet Take 1 tablet (40 mg total) by mouth at bedtime. 30 tablet 2   Calcium Carbonate-Vit D-Min (CALCIUM 1200 PO) Take 1 tablet by mouth daily.     losartan (COZAAR) 50 MG tablet Take 1 tablet (50 mg total) by mouth daily. 30 tablet 2   No current facility-administered medications on file prior to visit.    Allergies:  No Known Allergies  Physical Exam General: Frail middle-aged Caucasian lady seated, in no evident  distress Head: head normocephalic and atraumatic.  Neck: supple with no carotid or supraclavicular bruits Cardiovascular: regular rate and rhythm, no murmurs Musculoskeletal: no deformity Skin:  no rash/petichiae Vascular:  Normal pulses all extremities Vitals:  06/23/22 1259  BP: 118/76  Pulse: 82    Neurologic Exam Mental Status: Awake and fully alert. Oriented to place and time. Recent and remote memory intact. Attention span, concentration and fund of knowledge appropriate. Mood and affect appropriate.  Cranial Nerves: Fundoscopic exam not done s. Pupils equal, briskly reactive to light. Extraocular movements full without nystagmus. Visual fields full to confrontation. Hearing intact. Facial sensation intact. Face, tongue, palate moves normally and symmetrically.  Motor: Normal bulk and tone. Normal strength in all tested extremity muscles.  Except diminished fine finger movements on the right and mild right grip weakness.  Trace right hip flexor and ankle dorsiflexor weakness.  Tone is increased on the right with some spasticity in the right leg. Sensory.: intact to touch ,pinprick .position and vibratory sensation.  Coordination: Rapid alternating movements are slightly impaired on the right proportionate to degree of weakness.   Gait and Station: Arises from chair without difficulty. Stance is normal.  Mildly spastic ataxic gait with stiffness and dragging of the right leg but no foot drop..  Reflexes: 2+ and asymmetric and brisker on the right. Toes downgoing.   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  Posterior circulation TIA on 05/09/2021 likely symptomatic from partially healed vertebral artery dissection.  Vascular risk factors  of hyperlipidemia and vertebral artery dissection only.  Patient is doing well but has mild residual right-sided ataxia and gait imbalance.     PLAN: I had a long d/w patient and her husband about hercerebellar  stroke, residual gait imbalance,risk for recurrent stroke/TIAs, personally independently reviewed imaging studies and stroke evaluation results and answered questions.Continue aspirin 81 mg daily  for secondary stroke prevention and maintain strict control of hypertension with blood pressure goal below 130/90, diabetes with hemoglobin A1c goal below 6.5% and lipids with LDL cholesterol goal below 70 mg/dL. I also advised the patient to eat a healthy diet with plenty of whole grains, cereals, fruits and vegetables, exercise regularly and maintain ideal body weight . She was advised to use her cane at all times and avoid falls and injuries.Followup in the future with me in 1 year or call earlier if needed. Greater than 50% of time during this  35-minute isit was spent on counseling,explanation of diagnosis, of TIA and vertebral artery dissection planning of further management, discussion with patient and family and coordination of care Delia Heady, MD  [facility] [address] [place], [place] [address]  Phone [phone] Fax [phone] Note: This document was prepared with digital dictation and possible smart phrase technology. Any transcriptional errors that result from this process are unintentional

## 2022-06-23 NOTE — Patient Instructions (Addendum)
I had a long d/w patient and her husband about hercerebellar  stroke, residual gait imbalance,risk for recurrent stroke/TIAs, personally independently reviewed imaging studies and stroke evaluation results and answered questions.Continue aspirin 81 mg daily  for secondary stroke prevention and maintain strict control of hypertension with blood pressure goal below 130/90, diabetes with hemoglobin A1c goal below 6.5% and lipids with LDL cholesterol goal below 70 mg/dL. I also advised the patient to eat a healthy diet with plenty of whole grains, cereals, fruits and vegetables, exercise regularly and maintain ideal body weight . She was advised to use her cane at all times and avoid falls and injuries.Followup in the future with me in 1 year or call earlier if needed.

## 2022-06-24 ENCOUNTER — Ambulatory Visit (INDEPENDENT_AMBULATORY_CARE_PROVIDER_SITE_OTHER): Payer: 59 | Admitting: Family Medicine

## 2022-06-24 ENCOUNTER — Encounter: Payer: Self-pay | Admitting: Family Medicine

## 2022-06-24 VITALS — BP 110/78 | HR 77 | Temp 97.9°F | Resp 18 | Ht 62.0 in | Wt 119.0 lb

## 2022-06-24 DIAGNOSIS — Z23 Encounter for immunization: Secondary | ICD-10-CM | POA: Diagnosis not present

## 2022-06-24 DIAGNOSIS — I639 Cerebral infarction, unspecified: Secondary | ICD-10-CM

## 2022-06-24 DIAGNOSIS — I10 Essential (primary) hypertension: Secondary | ICD-10-CM

## 2022-06-24 DIAGNOSIS — R739 Hyperglycemia, unspecified: Secondary | ICD-10-CM

## 2022-06-24 DIAGNOSIS — I63441 Cerebral infarction due to embolism of right cerebellar artery: Secondary | ICD-10-CM

## 2022-06-24 DIAGNOSIS — I69351 Hemiplegia and hemiparesis following cerebral infarction affecting right dominant side: Secondary | ICD-10-CM | POA: Diagnosis not present

## 2022-06-24 DIAGNOSIS — I5032 Chronic diastolic (congestive) heart failure: Secondary | ICD-10-CM | POA: Diagnosis not present

## 2022-06-24 DIAGNOSIS — E785 Hyperlipidemia, unspecified: Secondary | ICD-10-CM

## 2022-06-24 LAB — CBC WITH DIFFERENTIAL/PLATELET
Basophils Absolute: 0 10*3/uL (ref 0.0–0.1)
Basophils Relative: 0.4 % (ref 0.0–3.0)
Eosinophils Absolute: 0.1 10*3/uL (ref 0.0–0.7)
Eosinophils Relative: 2.4 % (ref 0.0–5.0)
HCT: 38 % (ref 36.0–46.0)
Hemoglobin: 12.6 g/dL (ref 12.0–15.0)
Lymphocytes Relative: 28.1 % (ref 12.0–46.0)
Lymphs Abs: 1.6 10*3/uL (ref 0.7–4.0)
MCHC: 33.2 g/dL (ref 30.0–36.0)
MCV: 88.9 fl (ref 78.0–100.0)
Monocytes Absolute: 0.4 10*3/uL (ref 0.1–1.0)
Monocytes Relative: 6.6 % (ref 3.0–12.0)
Neutro Abs: 3.5 10*3/uL (ref 1.4–7.7)
Neutrophils Relative %: 62.5 % (ref 43.0–77.0)
Platelets: 229 10*3/uL (ref 150.0–400.0)
RBC: 4.27 Mil/uL (ref 3.87–5.11)
RDW: 12.7 % (ref 11.5–15.5)
WBC: 5.6 10*3/uL (ref 4.0–10.5)

## 2022-06-24 LAB — LIPID PANEL
Cholesterol: 107 mg/dL (ref 0–200)
HDL: 47.8 mg/dL (ref >39.00)
LDL Cholesterol: 50 mg/dL (ref 0–99)
NonHDL: 59.53
Total CHOL/HDL Ratio: 2
Triglycerides: 50 mg/dL (ref 0.0–149.0)
VLDL: 10 mg/dL (ref 0.0–40.0)

## 2022-06-24 LAB — COMPREHENSIVE METABOLIC PANEL
ALT: 27 U/L (ref 0–35)
AST: 32 U/L (ref 0–37)
Albumin: 3.9 g/dL (ref 3.5–5.2)
Alkaline Phosphatase: 89 U/L (ref 39–117)
BUN: 16 mg/dL (ref 6–23)
CO2: 29 mEq/L (ref 19–32)
Calcium: 9.3 mg/dL (ref 8.4–10.5)
Chloride: 104 mEq/L (ref 96–112)
Creatinine, Ser: 0.61 mg/dL (ref 0.40–1.20)
GFR: 96.9 mL/min (ref >60.00)
Glucose, Bld: 102 mg/dL — ABNORMAL HIGH (ref 70–99)
Potassium: 4.5 mEq/L (ref 3.5–5.1)
Sodium: 139 mEq/L (ref 135–145)
Total Bilirubin: 1.1 mg/dL (ref 0.2–1.2)
Total Protein: 7.3 g/dL (ref 6.0–8.3)

## 2022-06-24 LAB — HEMOGLOBIN A1C: Hgb A1c MFr Bld: 6.2 % (ref 4.6–6.5)

## 2022-06-24 NOTE — Patient Instructions (Signed)

## 2022-06-24 NOTE — Progress Notes (Signed)
Subjective:   By signing my name below, I, Judy Gutierrez, attest that this documentation has been prepared under the direction and in the presence of Judy Schultz, DO. 06/24/2022     Patient ID: Judy Gutierrez, female    DOB: [birth year], 61 y.o.   MRN: [id number]  No chief complaint on file.   HPI Patient is in today for an office visit.  Overall she is feeling okay today.   Yesterday she followed up with Dr. [name]. She continues to have residual issues with ambulation of her right leg and right hand. Usually she is accompanied while walking for stability and to prevent falls. Today there is very sight weakness of her RUE with extension.  She is compliant with losartan, lipitor, and 81 mg aspirin.  She denies having any fever, new muscle pain, joint pain , new moles, congestion, sinus pain, sore throat, chest pain, palpations, cough, SOB ,wheezing,n/v/d constipation, blood in stool, dysuria, frequency, hematuria, at this time  Colonoscopy last completed 09/27/2012. Pap Smear last completed 02/15/2011. Mammogram last completed 08/16/2021. Immunizations: COVID-19 vaccine last completed 08/26/2020. Influenza vaccine last completed 06/28/2021. We will update her Influenza vaccine today. Tetanus/TDAP last completed 06/28/2018. Diet: Lately she has not been eating well due to her loss of taste, also thought to be residual from her stroke. She states that she eats because she knows she has to. She is drinking 2-3 protein shakes a day, as well as beans for protein. She does not eat meat. Exercise: She walks and performs yoga daily.   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)    Stroke [facility])    Unspecified essential hypertension     Past Surgical History:  Procedure Laterality Date   BUBBLE STUDY  03/05/2020   Procedure: BUBBLE STUDY;   Surgeon: [name], MD;  Location: [facility] ENDOSCOPY;  Service: Cardiovascular;;   CESAREAN SECTION  1992   IR ANGIO INTRA EXTRACRAN SEL COM CAROTID INNOMINATE BILAT MOD SED  05/11/2021   IR ANGIO VERTEBRAL SEL SUBCLAVIAN INNOMINATE BILAT MOD SED  05/11/2021   LOOP RECORDER INSERTION N/A 03/05/2020   Procedure: LOOP RECORDER INSERTION;  Surgeon: [name], MD;  Location: [facility] INVASIVE CV LAB;  Service: Cardiovascular;  Laterality: N/A;   TEE WITHOUT CARDIOVERSION N/A 03/05/2020   Procedure: TRANSESOPHAGEAL ECHOCARDIOGRAM (TEE);  Surgeon: [name], MD;  Location: [facility] ENDOSCOPY;  Service: Cardiovascular;  Laterality: N/A;    Family History  Problem Relation Age of Onset   Pulmonary embolism Mother    Hepatitis Father    Diabetes Father    Breast cancer Sister 64   Diabetes Mellitus II Sister     Social History   Socioeconomic History   Marital status: Married    Spouse name: Not on file   Number of children: 1   Years of education: Not on file   Highest education level: Not on file  Occupational History   Occupation: convenience store    Comment: self employed  Tobacco Use   Smoking status: Never   Smokeless tobacco: Never  Substance and Sexual Activity   Alcohol use: No   Drug use: No   Sexual activity: Yes  Other Topics Concern   Not on file  Social History Narrative   Lives with husband   Right Handed   Drinks caffeine seldom  Social Determinants of Health   Financial Resource Strain: Not on file  Food Insecurity: Not on file  Transportation Needs: No Transportation Needs (02/18/2020)   PRAPARE - Administrator, Civil Service (Medical): No    Lack of Transportation (Non-Medical): No  Physical Activity: Not on file  Stress: Not on file  Social Connections: Not on file  Intimate Partner Violence: Not on file    Outpatient Medications Prior to Visit  Medication Sig Dispense Refill   aspirin 81 MG EC tablet Take 1 tablet (81 mg  total) by mouth daily. 90 tablet 1   atorvastatin (LIPITOR) 40 MG tablet Take 1 tablet (40 mg total) by mouth at bedtime. 30 tablet 2   Calcium Carbonate-Vit D-Min (CALCIUM 1200 PO) Take 1 tablet by mouth daily.     losartan (COZAAR) 50 MG tablet Take 1 tablet (50 mg total) by mouth daily. 30 tablet 2   No facility-administered medications prior to visit.    No Known Allergies  Review of Systems  Constitutional:  Negative for fever.  HENT:  Negative for congestion, sinus pain and sore throat.   Respiratory:  Negative for cough, shortness of breath and wheezing.   Gastrointestinal:  Negative for blood in stool, constipation, diarrhea, nausea and vomiting.  Genitourinary:  Negative for dysuria, frequency and hematuria.  Musculoskeletal:  Negative for joint pain and myalgias.       Objective:    Physical Exam Constitutional:      General: She is not in acute distress.    Appearance: Normal appearance. She is not ill-appearing.  HENT:     Head: Normocephalic and atraumatic.     Right Ear: Tympanic membrane, ear canal and external ear normal.     Left Ear: Tympanic membrane, ear canal and external ear normal.  Eyes:     Extraocular Movements: Extraocular movements intact.     Pupils: Pupils are equal, round, and reactive to light.  Cardiovascular:     Rate and Rhythm: Normal rate and regular rhythm.     Heart sounds: Normal heart sounds. No murmur heard.    No gallop.  Pulmonary:     Effort: Pulmonary effort is normal. No respiratory distress.     Breath sounds: Normal breath sounds. No wheezing or rales.  Abdominal:     General: Bowel sounds are normal. There is no distension.     Palpations: Abdomen is soft.     Tenderness: There is no abdominal tenderness. There is no guarding.  Musculoskeletal:        General: Normal range of motion.  Skin:    General: Skin is warm and dry.  Neurological:     General: No focal deficit present.     Mental Status: She is alert and  oriented to person, place, and time.     Motor: Weakness (Very slight RUE weakness with extension) present.  Psychiatric:        Mood and Affect: Mood normal.        Behavior: Behavior normal.     LMP 12/08/2010  Wt Readings from Last 3 Encounters:  06/23/22 119 lb 12.8 oz (54.3 kg)  02/17/22 120 lb 12.8 oz (54.8 kg)  12/21/21 121 lb 12.8 oz (55.2 kg)    Diabetic Foot Exam - Simple   No data filed    Lab Results  Component Value Date   WBC 5.4 12/21/2021   HGB 12.3 12/21/2021   HCT 36.3 12/21/2021   PLT 273.0 12/21/2021  GLUCOSE 103 (H) 12/21/2021   CHOL 95 12/21/2021   TRIG 46.0 12/21/2021   HDL 46.60 12/21/2021   LDLCALC 39 12/21/2021   ALT 20 12/21/2021   AST 29 12/21/2021   NA 137 12/21/2021   K 4.2 12/21/2021   CL 103 12/21/2021   CREATININE 0.58 12/21/2021   BUN 18 12/21/2021   CO2 29 12/21/2021   TSH 1.36 12/21/2021   INR 1.0 05/09/2021   HGBA1C 5.7 06/10/2021   MICROALBUR 0.8 12/04/2020    Lab Results  Component Value Date   TSH 1.36 12/21/2021   Lab Results  Component Value Date   WBC 5.4 12/21/2021   HGB 12.3 12/21/2021   HCT 36.3 12/21/2021   MCV 87.9 12/21/2021   PLT 273.0 12/21/2021   Lab Results  Component Value Date   NA 137 12/21/2021   K 4.2 12/21/2021   CO2 29 12/21/2021   GLUCOSE 103 (H) 12/21/2021   BUN 18 12/21/2021   CREATININE 0.58 12/21/2021   BILITOT 1.7 (H) 12/21/2021   ALKPHOS 81 12/21/2021   AST 29 12/21/2021   ALT 20 12/21/2021   PROT 7.4 12/21/2021   ALBUMIN 4.1 12/21/2021   CALCIUM 9.4 12/21/2021   ANIONGAP 7 05/09/2021   GFR 98.43 12/21/2021   Lab Results  Component Value Date   CHOL 95 12/21/2021   Lab Results  Component Value Date   HDL 46.60 12/21/2021   Lab Results  Component Value Date   LDLCALC 39 12/21/2021   Lab Results  Component Value Date   TRIG 46.0 12/21/2021   Lab Results  Component Value Date   CHOLHDL 2 12/21/2021   Lab Results  Component Value Date   HGBA1C 5.7 06/10/2021        Assessment & Plan:   Problem List Items Addressed This Visit   None   No orders of the defined types were placed in this encounter.   Rollen Sox, personally preformed the services described in this documentation.  All medical record entries made by the scribe were at my direction and in my presence.  I have reviewed the chart and discharge instructions (if applicable) and agree that the record reflects my personal performance and is accurate and complete. 06/24/2022  I,Mathew Stumpf,acting as a scribe for Judy Schultz, DO.,have documented all relevant documentation on the behalf of Judy Schultz, DO,as directed by  Judy Schultz, DO while in the presence of Judy Schultz, DO.   Judy Gutierrez

## 2022-06-27 ENCOUNTER — Ambulatory Visit (INDEPENDENT_AMBULATORY_CARE_PROVIDER_SITE_OTHER): Payer: 59

## 2022-06-27 DIAGNOSIS — I639 Cerebral infarction, unspecified: Secondary | ICD-10-CM | POA: Diagnosis not present

## 2022-06-28 ENCOUNTER — Encounter: Payer: Self-pay | Admitting: Family Medicine

## 2022-06-28 NOTE — Assessment & Plan Note (Signed)
Encourage heart healthy diet such as MIND or DASH diet, increase exercise, avoid trans fats, simple carbohydrates and processed foods, consider a krill or fish or flaxseed oil cap daily.  °

## 2022-06-28 NOTE — Assessment & Plan Note (Signed)
With r hemiplegia' Pt still improving

## 2022-06-28 NOTE — Assessment & Plan Note (Signed)
Well controlled, no changes to meds. Encouraged heart healthy diet such as the DASH diet and exercise as tolerated.  °

## 2022-06-29 LAB — CUP PACEART REMOTE DEVICE CHECK
Date Time Interrogation Session: 20230912230359
Implantable Pulse Generator Implant Date: 20210520

## 2022-06-29 NOTE — Telephone Encounter (Signed)
I have called pt and confirmed that provider stated that her labs look good and we will see her in 6 m. She stated understanding.

## 2022-07-14 NOTE — Progress Notes (Signed)
Carelink Summary Report / Loop Recorder 

## 2022-07-15 ENCOUNTER — Other Ambulatory Visit: Payer: Self-pay | Admitting: Family Medicine

## 2022-07-15 DIAGNOSIS — Z1231 Encounter for screening mammogram for malignant neoplasm of breast: Secondary | ICD-10-CM

## 2022-07-28 ENCOUNTER — Ambulatory Visit (INDEPENDENT_AMBULATORY_CARE_PROVIDER_SITE_OTHER): Payer: 59

## 2022-07-28 DIAGNOSIS — I639 Cerebral infarction, unspecified: Secondary | ICD-10-CM

## 2022-08-01 LAB — CUP PACEART REMOTE DEVICE CHECK
Date Time Interrogation Session: 20231015230715
Implantable Pulse Generator Implant Date: 20210520

## 2022-08-04 NOTE — Progress Notes (Signed)
Carelink Summary Report / Loop Recorder 

## 2022-08-29 ENCOUNTER — Ambulatory Visit: Payer: 59 | Admitting: Neurology

## 2022-08-29 ENCOUNTER — Ambulatory Visit (INDEPENDENT_AMBULATORY_CARE_PROVIDER_SITE_OTHER): Payer: 59

## 2022-08-29 DIAGNOSIS — I639 Cerebral infarction, unspecified: Secondary | ICD-10-CM

## 2022-08-30 LAB — CUP PACEART REMOTE DEVICE CHECK
Date Time Interrogation Session: 20231112231503
Implantable Pulse Generator Implant Date: 20210520

## 2022-09-19 IMAGING — CT CT ANGIO NECK
1 of 4 series · 2 of 16 positions shown · non-contrast
Comparison: CT angio head and neck 08/25/2021

CLINICAL DATA: History of stroke. History of vertebral artery
dissection.

EXAM:
CT ANGIOGRAPHY HEAD AND NECK
TECHNIQUE: Multidetector CT imaging of the head and neck was performed using
the standard protocol during bolus administration of intravenous
contrast. Multiplanar CT image reconstructions and MIPs were
obtained to evaluate the vascular anatomy. Carotid stenosis
measurements (when applicable) are obtained utilizing NASCET
criteria, using the distal internal carotid diameter as the
denominator.

[Series 7: head/neck angio · axial · 0.43mm/px · z∈[+915,+1033]mm · 2 of 177 slices shown]
[im 59/177  soft-tissue]
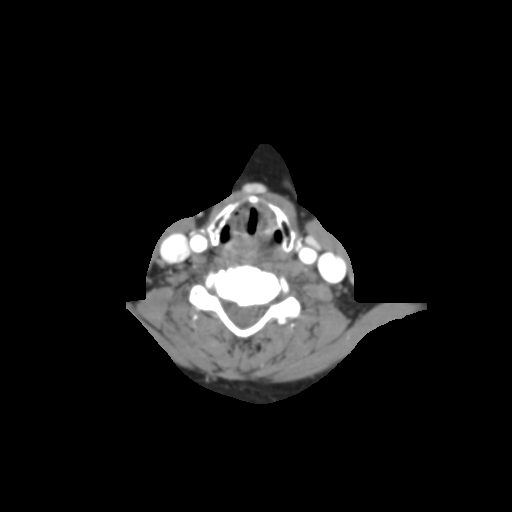
[im 118/177  bone]
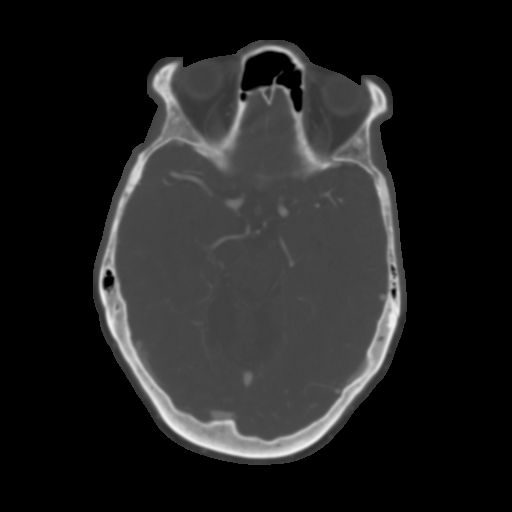

[2 of 16 positions shown; findings below may reference images not displayed]

RADIATION DOSE REDUCTION: This exam was performed according to the
departmental dose-optimization program which includes automated
exposure control, adjustment of the mA and/or kV according to
patient size and/or use of iterative reconstruction technique.

CONTRAST:  75mL FZIZJ0-RK6 IOPAMIDOL (FZIZJ0-RK6) INJECTION 76%
FINDINGS: CT HEAD FINDINGS

Brain: Chronic infarcts in the right superior and inferior
cerebellum unchanged. Smaller chronic infarct left superior
cerebellum unchanged. Mild dorsal midbrain encephalomalacia
bilaterally unchanged.

Negative for acute infarct, hemorrhage, mass. Ventricle size normal.

Vascular: Negative for hyperdense vessel

Skull: Negative

Sinuses: Moderate mucosal edema left maxillary sinus. Mild mucosal
edema in the sphenoid sinus. Mastoid clear

Orbits: Negative

Review of the MIP images confirms the above findings

CTA NECK FINDINGS

Aortic arch: Normal aortic arch. Proximal great vessels widely
patent. Left vertebral artery origin from the arch.

Right carotid system: Right carotid widely patent without stenosis.
No atherosclerotic disease or dissection

Left carotid system: Left carotid widely patent. Mild
atherosclerotic calcification left carotid bifurcation.

Vertebral arteries: Right vertebral artery is patent to the basilar
without stenosis. Small 3 mm pseudoaneurysm of the distal right
vertebral artery just above the arch of C1. This is unchanged from
the prior study. No dissection flap or stenosis identified.

Left vertebral artery origin from the aortic arch. No stenosis or
dissection.

Skeleton: Mild degenerative changes cervical spine. No acute
abnormality.

Other neck: Negative for mass or adenopathy in the neck. Small right
parotid lymph nodes unchanged.

Upper chest: Lung apices clear bilaterally.

Review of the MIP images confirms the above findings

CTA HEAD FINDINGS

Anterior circulation: Internal carotid artery widely patent through
the skull base and cavernous segment. Minimal atherosclerotic
disease. Anterior and middle cerebral arteries widely patent
bilaterally without stenosis or aneurysm.

Posterior circulation: Both vertebral arteries patent to the
basilar. PICA patent bilaterally. Basilar patent. AICA, superior
cerebellar, and posterior cerebral arteries patent bilaterally.
Fetal origin of the posterior cerebral artery bilaterally. No
intracranial aneurysm.

Venous sinuses: Normal venous enhancement

Anatomic variants: None

Review of the MIP images confirms the above findings
IMPRESSION: 1. Chronic cerebellar infarct right greater than left. No change
from the prior study and no acute intracranial abnormality
2. Chronic dissection distal right vertebral artery with 3 mm
pseudoaneurysm. No significant stenosis. No change from the prior CT
angio.
3. Left vertebral artery widely patent without stenosis or
dissection
4. No significant carotid stenosis
5. No intracranial stenosis.

## 2022-09-20 ENCOUNTER — Ambulatory Visit
Admission: RE | Admit: 2022-09-20 | Discharge: 2022-09-20 | Disposition: A | Discharge status: Home / Self Care | Payer: 59 | Source: Ambulatory Visit | Attending: Family Medicine | Admitting: Family Medicine

## 2022-09-20 DIAGNOSIS — Z1231 Encounter for screening mammogram for malignant neoplasm of breast: Secondary | ICD-10-CM

## 2022-09-29 ENCOUNTER — Ambulatory Visit (INDEPENDENT_AMBULATORY_CARE_PROVIDER_SITE_OTHER): Payer: 59

## 2022-09-29 DIAGNOSIS — I639 Cerebral infarction, unspecified: Secondary | ICD-10-CM

## 2022-09-29 LAB — CUP PACEART REMOTE DEVICE CHECK
Date Time Interrogation Session: 20231213230403
Implantable Pulse Generator Implant Date: 20210520

## 2022-10-13 NOTE — Progress Notes (Signed)
Carelink Summary Report / Loop Recorder 

## 2022-10-21 NOTE — Progress Notes (Signed)
Carelink Summary Report / Loop Recorder 

## 2022-10-31 ENCOUNTER — Ambulatory Visit (INDEPENDENT_AMBULATORY_CARE_PROVIDER_SITE_OTHER): Payer: 59

## 2022-10-31 DIAGNOSIS — I639 Cerebral infarction, unspecified: Secondary | ICD-10-CM | POA: Diagnosis not present

## 2022-11-02 LAB — CUP PACEART REMOTE DEVICE CHECK
Date Time Interrogation Session: 20240115230756
Implantable Pulse Generator Implant Date: 20210520

## 2022-11-08 ENCOUNTER — Ambulatory Visit: Payer: 59 | Admitting: Neurology

## 2022-12-01 ENCOUNTER — Ambulatory Visit (INDEPENDENT_AMBULATORY_CARE_PROVIDER_SITE_OTHER): Payer: 59

## 2022-12-01 DIAGNOSIS — I639 Cerebral infarction, unspecified: Secondary | ICD-10-CM | POA: Diagnosis not present

## 2022-12-03 LAB — CUP PACEART REMOTE DEVICE CHECK
Date Time Interrogation Session: 20240217230910
Implantable Pulse Generator Implant Date: 20210520

## 2022-12-20 NOTE — Progress Notes (Signed)
Carelink Summary Report / Loop Recorder 

## 2022-12-28 NOTE — Progress Notes (Signed)
Carelink Summary Report / Loop Recorder 

## 2022-12-29 ENCOUNTER — Encounter: Payer: 59 | Admitting: Family Medicine

## 2023-01-02 ENCOUNTER — Ambulatory Visit (INDEPENDENT_AMBULATORY_CARE_PROVIDER_SITE_OTHER): Payer: 59

## 2023-01-02 DIAGNOSIS — I639 Cerebral infarction, unspecified: Secondary | ICD-10-CM | POA: Diagnosis not present

## 2023-01-03 DIAGNOSIS — Z0271 Encounter for disability determination: Secondary | ICD-10-CM

## 2023-01-03 LAB — CUP PACEART REMOTE DEVICE CHECK
Date Time Interrogation Session: 20240317232444
Implantable Pulse Generator Implant Date: 20210520

## 2023-01-05 ENCOUNTER — Ambulatory Visit (INDEPENDENT_AMBULATORY_CARE_PROVIDER_SITE_OTHER): Payer: 59 | Admitting: Family Medicine

## 2023-01-05 ENCOUNTER — Encounter: Payer: Self-pay | Admitting: Family Medicine

## 2023-01-05 VITALS — BP 130/98 | HR 81 | Temp 98.4°F | Resp 16 | Ht 62.0 in | Wt 121.2 lb

## 2023-01-05 DIAGNOSIS — E785 Hyperlipidemia, unspecified: Secondary | ICD-10-CM

## 2023-01-05 DIAGNOSIS — E2839 Other primary ovarian failure: Secondary | ICD-10-CM | POA: Insufficient documentation

## 2023-01-05 DIAGNOSIS — R739 Hyperglycemia, unspecified: Secondary | ICD-10-CM | POA: Diagnosis not present

## 2023-01-05 DIAGNOSIS — Z0000 Encounter for general adult medical examination without abnormal findings: Secondary | ICD-10-CM | POA: Diagnosis not present

## 2023-01-05 DIAGNOSIS — I10 Essential (primary) hypertension: Secondary | ICD-10-CM | POA: Diagnosis not present

## 2023-01-05 MED ORDER — LOSARTAN POTASSIUM 50 MG PO TABS: 50.0000 mg | ORAL_TABLET | Freq: Every day | ORAL | 2 refills | Status: DC

## 2023-01-05 MED ORDER — LOSARTAN POTASSIUM 100 MG PO TABS: 100.0000 mg | ORAL_TABLET | Freq: Every day | ORAL | 1 refills | Status: DC

## 2023-01-05 NOTE — Patient Instructions (Signed)
Preventive Care 62-62 Years Old, Female Preventive care refers to lifestyle choices and visits with your health care provider that can promote health and wellness. Preventive care visits are also called wellness exams. What can I expect for my preventive care visit? Counseling Your health care provider may ask you questions about your: Medical history, including: Past medical problems. Family medical history. Pregnancy history. Current health, including: Menstrual cycle. Method of birth control. Emotional well-being. Home life and relationship well-being. Sexual activity and sexual health. Lifestyle, including: Alcohol, nicotine or tobacco, and drug use. Access to firearms. Diet, exercise, and sleep habits. Work and work environment. Sunscreen use. Safety issues such as seatbelt and bike helmet use. Physical exam Your health care provider will check your: Height and weight. These may be used to calculate your BMI (body mass index). BMI is a measurement that tells if you are at a healthy weight. Waist circumference. This measures the distance around your waistline. This measurement also tells if you are at a healthy weight and may help predict your risk of certain diseases, such as type 2 diabetes and high blood pressure. Heart rate and blood pressure. Body temperature. Skin for abnormal spots. What immunizations do I need?  Vaccines are usually given at various ages, according to a schedule. Your health care provider will recommend vaccines for you based on your age, medical history, and lifestyle or other factors, such as travel or where you work. What tests do I need? Screening Your health care provider may recommend screening tests for certain conditions. This may include: Lipid and cholesterol levels. Diabetes screening. This is done by checking your blood sugar (glucose) after you have not eaten for a while (fasting). Pelvic exam and Pap test. Hepatitis B test. Hepatitis C  test. HIV (human immunodeficiency virus) test. STI (sexually transmitted infection) testing, if you are at risk. Lung cancer screening. Colorectal cancer screening. Mammogram. Talk with your health care provider about when you should start having regular mammograms. This may depend on whether you have a family history of breast cancer. BRCA-related cancer screening. This may be done if you have a family history of breast, ovarian, tubal, or peritoneal cancers. Bone density scan. This is done to screen for osteoporosis. Talk with your health care provider about your test results, treatment options, and if necessary, the need for more tests. Follow these instructions at home: Eating and drinking  Eat a diet that includes fresh fruits and vegetables, whole grains, lean protein, and low-fat dairy products. Take vitamin and mineral supplements as recommended by your health care provider. Do not drink alcohol if: Your health care provider tells you not to drink. You are pregnant, may be pregnant, or are planning to become pregnant. If you drink alcohol: Limit how much you have to 0-1 drink a day. Know how much alcohol is in your drink. In the [place] one drink equals one 12 oz bottle of beer (355 mL), one 5 oz glass of wine (148 mL), or one 1 oz glass of hard liquor (44 mL). Lifestyle Brush your teeth every morning and night with fluoride toothpaste. Floss one time each day. Exercise for at least 30 minutes 5 or more days each week. Do not use any products that contain nicotine or tobacco. These products include cigarettes, chewing tobacco, and vaping devices, such as e-cigarettes. If you need help quitting, ask your health care provider. Do not use drugs. If you are sexually active, practice safe sex. Use a condom or other form of protection to   prevent STIs. If you do not wish to become pregnant, use a form of birth control. If you plan to become pregnant, see your health care provider for a  prepregnancy visit. Take aspirin only as told by your health care provider. Make sure that you understand how much to take and what form to take. Work with your health care provider to find out whether it is safe and beneficial for you to take aspirin daily. Find healthy ways to manage stress, such as: Meditation, yoga, or listening to music. Journaling. Talking to a trusted person. Spending time with friends and family. Minimize exposure to UV radiation to reduce your risk of skin cancer. Safety Always wear your seat belt while driving or riding in a vehicle. Do not drive: If you have been drinking alcohol. Do not ride with someone who has been drinking. When you are tired or distracted. While texting. If you have been using any mind-altering substances or drugs. Wear a helmet and other protective equipment during sports activities. If you have firearms in your house, make sure you follow all gun safety procedures. Seek help if you have been physically or sexually abused. What's next? Visit your health care provider once a year for an annual wellness visit. Ask your health care provider how often you should have your eyes and teeth checked. Stay up to date on all vaccines. This information is not intended to replace advice given to you by your health care provider. Make sure you discuss any questions you have with your health care provider. Document Revised: 03/31/2021 Document Reviewed: 03/31/2021 Elsevier Patient Education  2023 [employer]  

## 2023-01-05 NOTE — Progress Notes (Addendum)
Subjective:   By signing my name below, I, Shehryar Baig, attest that this documentation has been prepared under the direction and in the presence of Ann Held, DO. 01/05/2023   Patient ID: Judy Gutierrez, female    DOB: 05-Sep-1961, 62 y.o.   MRN: [id number]  Chief Complaint  Patient presents with   Annual Exam    Pt states not fasting     HPI Patient is in today for a comprehensive physical exam.   She is using a cane for ambulatory assistance. She has been using it since her stroke. She reports having weaker grip in her right hand compared to her left.  Her blood pressure is slightly elevated during this visit.  BP Readings from Last 3 Encounters:  01/05/23 (!) 130/98  06/24/22 110/78  06/23/22 118/76   Pulse Readings from Last 3 Encounters:  01/05/23 81  06/24/22 77  06/23/22 82   She denies fever, new moles, congestion, sinus pain, sore throat, chest pain, palpitations, cough, shortness of breath, wheezing, nausea, vomiting, abdominal pain, diarrhea, constipation, dysuria, frequency, hematuria, new muscle pain, new joint pain, or headaches at this time.  She has no changes to her family medical history. She he's no new surgical procedures to report.  Mammogram was last completed 09/20/2022. Results are normal. Repeat in 1 year.  Pap smear was last completed 05/13/2020. She is UTD on vision and dental care.    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)    Stroke [facility])    Unspecified essential hypertension     Past Surgical History:  Procedure Laterality Date   BUBBLE STUDY  03/05/2020   Procedure: BUBBLE STUDY;  Surgeon: Donato Heinz, MD;  Location: [place];  Service: Cardiovascular;;   CESAREAN SECTION  1992   IR ANGIO INTRA EXTRACRAN SEL COM CAROTID INNOMINATE BILAT MOD SED  05/11/2021   IR  ANGIO VERTEBRAL SEL SUBCLAVIAN INNOMINATE BILAT MOD SED  05/11/2021   LOOP RECORDER INSERTION N/A 03/05/2020   Procedure: LOOP RECORDER INSERTION;  Surgeon: Deboraha Sprang, MD;  Location: [place] CV [facility];  Service: Cardiovascular;  Laterality: N/A;   TEE WITHOUT CARDIOVERSION N/A 03/05/2020   Procedure: TRANSESOPHAGEAL ECHOCARDIOGRAM (TEE);  Surgeon: Donato Heinz, MD;  Location: [facility] ENDOSCOPY;  Service: Cardiovascular;  Laterality: N/A;    Family History  Problem Relation Age of Onset   Pulmonary embolism Mother    Hepatitis Father    Diabetes Father    Breast cancer Sister 73   Diabetes Mellitus II Sister     Social History   Socioeconomic History   Marital status: Married    Spouse name: Not on file   Number of children: 1   Years of education: Not on file   Highest education level: Not on file  Occupational History   Occupation: convenience store    Comment: self employed  Tobacco Use   Smoking status: Never   Smokeless tobacco: Never  Substance and Sexual Activity   Alcohol use: No   Drug use: No   Sexual activity: Yes  Other Topics Concern   Not on file  Social History Narrative   Lives with husband   Right Handed   Drinks caffeine seldom   Social Determinants of Health   Financial Resource Strain: Not on file  Food Insecurity: Not on file  Transportation Needs: No Transportation Needs (02/18/2020)   PRAPARE - Hydrologist (Medical): No    Lack of Transportation (Non-Medical): No  Physical Activity: Not on file  Stress: Not on file  Social Connections: Not on file  Intimate Partner Violence: Not on file    Outpatient Medications Prior to Visit  Medication Sig Dispense Refill   amLODipine (NORVASC) 5 MG tablet Take 1 tablet by mouth daily.     aspirin 81 MG EC tablet Take 1 tablet (81 mg total) by mouth daily. 90 tablet 1   atorvastatin (LIPITOR) 40 MG tablet Take 1 tablet (40 mg total) by mouth at bedtime. 30 tablet  2   Calcium Carbonate-Vit D-Min (CALCIUM 1200 PO) Take 1 tablet by mouth daily.     losartan (COZAAR) 50 MG tablet Take 1 tablet (50 mg total) by mouth daily. 30 tablet 2   No facility-administered medications prior to visit.    No Known Allergies  Review of Systems  Constitutional:  Negative for fever and malaise/fatigue.  HENT:  Negative for congestion, sinus pain and sore throat.   Eyes:  Negative for blurred vision.  Respiratory:  Negative for cough, shortness of breath and wheezing.   Cardiovascular:  Negative for chest pain, palpitations and leg swelling.  Gastrointestinal:  Negative for abdominal pain, blood in stool, constipation, diarrhea, nausea and vomiting.  Genitourinary:  Negative for dysuria, frequency and hematuria.  Musculoskeletal:  Negative for falls.       (-)new muscle pain (-)new joint pain  Skin:  Negative for rash.       (-)New moles  Neurological:  Negative for dizziness, loss of consciousness and headaches.  Endo/Heme/Allergies:  Negative for environmental allergies.  Psychiatric/Behavioral:  Negative for depression. The patient is not nervous/anxious.        Objective:    Physical Exam Constitutional:      General: She is not in acute distress.    Appearance: Normal appearance. She is not ill-appearing.  HENT:     Head: Normocephalic and atraumatic.     Right Ear: Tympanic membrane, ear canal and external ear normal.     Left Ear: Tympanic membrane, ear canal and external ear normal.  Eyes:     Extraocular Movements: Extraocular movements intact.     Pupils: Pupils are equal, round, and reactive to light.  Cardiovascular:     Rate and Rhythm: Normal rate and regular rhythm.     Heart sounds: Normal heart sounds. No murmur heard.    No gallop.  Pulmonary:     Effort: Pulmonary effort is normal. No respiratory distress.     Breath sounds: Normal breath sounds. No wheezing or rales.  Abdominal:     General: Bowel sounds are normal. There is no  distension.     Palpations: Abdomen is soft.     Tenderness: There is no abdominal tenderness. There is no guarding.  Skin:    General: Skin is warm and dry.  Neurological:     Mental Status: She is alert and oriented to person, place, and time.  Psychiatric:        Judgment: Judgment normal.     BP (!) 130/98   Pulse 81   Temp 98.4 F (36.9 C) (Oral)   Resp 16   Ht 5\' 2"  (1.575 m)   Wt 121 lb 3.2 oz (55 kg)   LMP 12/08/2010   SpO2 98%   BMI 22.17 kg/m  Wt Readings from Last 3  Encounters:  01/05/23 121 lb 3.2 oz (55 kg)  06/24/22 119 lb (54 kg)  06/23/22 119 lb 12.8 oz (54.3 kg)       Assessment & Plan:  Preventative health care Assessment & Plan: Ghm utd Check labs  See AVS    Orders: -     CBC with Differential/Platelet -     Comprehensive metabolic panel -     Lipid panel -     TSH  Primary hypertension -     CBC with Differential/Platelet -     Comprehensive metabolic panel -     Lipid panel  Hyperlipidemia, unspecified hyperlipidemia type Assessment & Plan: Encourage heart healthy diet such as MIND or DASH diet, increase exercise, avoid trans fats, simple carbohydrates and processed foods, consider a krill or fish or flaxseed oil cap daily.    Orders: -     Comprehensive metabolic panel -     Lipid panel  Hyperglycemia -     Comprehensive metabolic panel  Estrogen deficiency -     DG Bone Density; Future  Essential hypertension Assessment & Plan: Poorly controlled will alter medications, encouraged DASH diet, minimize caffeine and obtain adequate sleep. Report concerning symptoms and follow up as directed and as needed  Add losartan 50 mg daily    Other orders -     Losartan Potassium; Take 1 tablet (50 mg total) by mouth daily.  Dispense: 30 tablet; Refill: 2    I, Ann Held, DO, personally preformed the services described in this documentation.  All medical record entries made by the scribe were at my direction and in my  presence.  I have reviewed the chart and discharge instructions (if applicable) and agree that the record reflects my personal performance and is accurate and complete. 01/05/2023   I,Shehryar Baig,acting as a scribe for Ann Held, DO.,have documented all relevant documentation on the behalf of Ann Held, DO,as directed by  Ann Held, DO while in the presence of Ann Held, DO.   Ann Held, DO

## 2023-01-05 NOTE — Assessment & Plan Note (Signed)
Encourage heart healthy diet such as MIND or DASH diet, increase exercise, avoid trans fats, simple carbohydrates and processed foods, consider a krill or fish or flaxseed oil cap daily.  °

## 2023-01-05 NOTE — Assessment & Plan Note (Signed)
Ghm utd Check labs See AVS 

## 2023-01-05 NOTE — Assessment & Plan Note (Signed)
Poorly controlled will alter medications, encouraged DASH diet, minimize caffeine and obtain adequate sleep. Report concerning symptoms and follow up as directed and as needed  Add losartan 50 mg daily

## 2023-01-06 ENCOUNTER — Encounter: Payer: Self-pay | Admitting: Family Medicine

## 2023-01-06 LAB — LIPID PANEL
Cholesterol: 114 mg/dL (ref 0–200)
HDL: 51.5 mg/dL (ref >39.00)
LDL Cholesterol: 51 mg/dL (ref 0–99)
NonHDL: 62.38
Total CHOL/HDL Ratio: 2
Triglycerides: 59 mg/dL (ref 0.0–149.0)
VLDL: 11.8 mg/dL (ref 0.0–40.0)

## 2023-01-06 LAB — CBC WITH DIFFERENTIAL/PLATELET
Basophils Absolute: 0.1 10*3/uL (ref 0.0–0.1)
Basophils Relative: 0.8 % (ref 0.0–3.0)
Eosinophils Absolute: 0.2 10*3/uL (ref 0.0–0.7)
Eosinophils Relative: 2.4 % (ref 0.0–5.0)
HCT: 37.4 % (ref 36.0–46.0)
Hemoglobin: 12.5 g/dL (ref 12.0–15.0)
Lymphocytes Relative: 28 % (ref 12.0–46.0)
Lymphs Abs: 1.8 10*3/uL (ref 0.7–4.0)
MCHC: 33.4 g/dL (ref 30.0–36.0)
MCV: 87.9 fl (ref 78.0–100.0)
Monocytes Absolute: 0.4 10*3/uL (ref 0.1–1.0)
Monocytes Relative: 5.8 % (ref 3.0–12.0)
Neutro Abs: 4.2 10*3/uL (ref 1.4–7.7)
Neutrophils Relative %: 63 % (ref 43.0–77.0)
Platelets: 258 10*3/uL (ref 150.0–400.0)
RBC: 4.25 Mil/uL (ref 3.87–5.11)
RDW: 12.9 % (ref 11.5–15.5)
WBC: 6.6 10*3/uL (ref 4.0–10.5)

## 2023-01-06 LAB — COMPREHENSIVE METABOLIC PANEL
ALT: 34 U/L (ref 0–35)
AST: 49 U/L — ABNORMAL HIGH (ref 0–37)
Albumin: 4.2 g/dL (ref 3.5–5.2)
Alkaline Phosphatase: 98 U/L (ref 39–117)
BUN: 21 mg/dL (ref 6–23)
CO2: 28 mEq/L (ref 19–32)
Calcium: 9.5 mg/dL (ref 8.4–10.5)
Chloride: 104 mEq/L (ref 96–112)
Creatinine, Ser: 0.68 mg/dL (ref 0.40–1.20)
GFR: 94.04 mL/min (ref >60.00)
Glucose, Bld: 111 mg/dL — ABNORMAL HIGH (ref 70–99)
Potassium: 4.3 mEq/L (ref 3.5–5.1)
Sodium: 141 mEq/L (ref 135–145)
Total Bilirubin: 1.3 mg/dL — ABNORMAL HIGH (ref 0.2–1.2)
Total Protein: 7.7 g/dL (ref 6.0–8.3)

## 2023-01-06 LAB — TSH: TSH: 1.72 u[IU]/mL (ref 0.35–5.50)

## 2023-01-12 ENCOUNTER — Other Ambulatory Visit: Payer: Self-pay | Admitting: Family Medicine

## 2023-01-12 DIAGNOSIS — R739 Hyperglycemia, unspecified: Secondary | ICD-10-CM

## 2023-01-12 DIAGNOSIS — I10 Essential (primary) hypertension: Secondary | ICD-10-CM

## 2023-01-12 DIAGNOSIS — E785 Hyperlipidemia, unspecified: Secondary | ICD-10-CM

## 2023-02-02 ENCOUNTER — Ambulatory Visit (INDEPENDENT_AMBULATORY_CARE_PROVIDER_SITE_OTHER): Payer: 59

## 2023-02-02 DIAGNOSIS — I639 Cerebral infarction, unspecified: Secondary | ICD-10-CM

## 2023-02-02 LAB — CUP PACEART REMOTE DEVICE CHECK
Date Time Interrogation Session: 20240417230908
Implantable Pulse Generator Implant Date: 20210520

## 2023-02-14 NOTE — Progress Notes (Signed)
Carelink Summary Report / Loop Recorder 

## 2023-03-06 ENCOUNTER — Ambulatory Visit (INDEPENDENT_AMBULATORY_CARE_PROVIDER_SITE_OTHER): Payer: 59

## 2023-03-06 DIAGNOSIS — I639 Cerebral infarction, unspecified: Secondary | ICD-10-CM

## 2023-03-06 NOTE — Progress Notes (Signed)
Carelink Summary Report / Loop Recorder 

## 2023-03-07 LAB — CUP PACEART REMOTE DEVICE CHECK
Date Time Interrogation Session: 20240520230458
Implantable Pulse Generator Implant Date: 20210520

## 2023-03-22 ENCOUNTER — Telehealth: Payer: Self-pay

## 2023-03-22 NOTE — Telephone Encounter (Signed)
The patient called wanting to know if she still needs the loop recorder. I let her speak with Baird Lyons, rn. He recommended for her to continue to be monitored with the loop. If she do not want to be monitored he recommends that she speak with her doctor.   The patient states she got a bill from for the loop of $76. She cannot afford to pay. She applied for disability. I told her I will cancel her upcoming remotes if she can not afford them. I did let her know we will continue to monitor her.    I will let her doctor know.   I told her to contact the billing department to ask if she can make payment arraignments for the past payment.

## 2023-03-23 NOTE — Telephone Encounter (Signed)
LMOVM for patient to give me a call back.  

## 2023-03-24 NOTE — Telephone Encounter (Signed)
I spoke with the patient and she states her husband paid the bill already.

## 2023-04-04 NOTE — Progress Notes (Signed)
Carelink Summary Report / Loop Recorder 

## 2023-04-04 NOTE — Addendum Note (Signed)
Addended by: Geralyn Flash D on: 04/04/2023 12:55 PM   Modules accepted: Level of Service

## 2023-06-01 ENCOUNTER — Encounter (INDEPENDENT_AMBULATORY_CARE_PROVIDER_SITE_OTHER): Payer: Self-pay

## 2023-06-20 ENCOUNTER — Encounter: Payer: Self-pay | Admitting: Neurology

## 2023-06-20 ENCOUNTER — Ambulatory Visit: Payer: 59 | Admitting: Neurology

## 2023-06-20 VITALS — BP 133/79 | HR 75 | Ht 62.0 in | Wt 125.0 lb

## 2023-06-20 DIAGNOSIS — R252 Cramp and spasm: Secondary | ICD-10-CM | POA: Diagnosis not present

## 2023-06-20 DIAGNOSIS — R2681 Unsteadiness on feet: Secondary | ICD-10-CM

## 2023-06-20 DIAGNOSIS — Z8673 Personal history of transient ischemic attack (TIA), and cerebral infarction without residual deficits: Secondary | ICD-10-CM

## 2023-06-20 NOTE — Progress Notes (Signed)
[facility] [address] [place]. [place] [address] [phone]       OFFICE FOLLOW-UP NOTE  Ms. Iva Boop Date of Birth:  1961-09-25 Medical Record Number:  [id number]   HPI: Initial visit 03/04/2020  :Ms. Lamberson is a 62 year old [place] origin lady seen today for initial office follow-up visit following hospital consultation for stroke in April 2021.  She is accompanied by her husband.  History is obtained from them, review of electronic medical records and I personally reviewed imaging films in PACS.  She presented to [facility] emergency room with sudden onset of dizziness, diplopia, gait imbalance and vomiting.  She was seen by telemetry neurologist and given IV TPA posterior circulation stroke.  She was transferred to [facility].  Initial CT scan of the head was unremarkable but MRI scan showed a small left superior cerebellar as well as medial midbrain infarct.  2D echo showed normal ejection fraction without cardiac source of embolism.  Cardiac monitoring during hospitalization did not show any paroxysmal A. fib.  Transcranial Doppler bubble study was negative for PFO.  LDL cholesterol is elevated 138 mg percent.  Hemoglobin A1c was borderline at 6.0.  Hypercoagulable labs and vasculitic labs were negative.  CT angiogram of the brain and neck showed no significant large vessel stenosis in the neck of the brain.  Patient was discharged home on aspirin and Plavix for 3 weeks and now is currently on aspirin alone.  Patient states she is doing well.  She is getting home physical and occupational therapy.  Her dizziness and gait imbalance have improved completely.  Double vision is also gone.  She had called the office few weeks ago complaining of eye twitching and some numbness on the right side and was wondering if this was related to the transcranial Doppler study that she had had.  The symptoms also have gone.  She is scheduled to undergo TEE  tomorrow followed by loop recorder.  She is tolerating aspirin well without bruising or bleeding.  She is also tolerating Lipitor well without muscle aches and pains.  Her blood pressure is well controlled.  She ran out of Cozaar  3 days ago but yet her blood pressure today is fine at 128/83. Update 08/19/2020 : She returns for follow-up after last visit 6 months ago.  She continues to do well.  She has had no recurrent stroke or TIA symptoms.  She is tolerating aspirin well without bruising or bleeding.  She remains on Lipitor and tolerating it well without muscle aches and pains.  She had follow-up lipid profile checked on 06/01/2020 and LDL cholesterol was 51 mg percent.  She had TEE done on 03/05/2020 which was unremarkable and she underwent loop recorder insertion.  So far monthly analysis has shown few episodes of transient A. fib which were felt to be artifact but no real A. fib was found.  She states she is recovered completely back to normal.  She has no complaints.  Her blood pressures well controlled today it is borderline at 134/68.  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 Update 05/11/2021 : Patient is seen emergently today for follow-up following recent admission for TIA.  She states that on 05/09/2021 when she bent down she noticed sudden onset of numbness involving left face as well as some dizziness and imbalance.  This did not last long and recovered.  She was admitted to  the hospital for TIA work-up and MRI scan of the brain did not show an acute stroke and encephalomalacia in bilateral cerebellum from a previous stroke.  CT angiogram of brain and neck was both unremarkable however she underwent diagnostic catheter angiogram today by Dr. 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 2.Focal circumferential narrowing of  the Lt VA at the level of  the dens also suspicious for a non flow limiting dissection..  Patient was on aspirin prior to admission and Plavix was added and she was discharged.  She has finished home physical occupational therapy and is planning to start outpatient therapy soon.  She states her dizziness and speech have improved.  Her strength is also a lot better with her balance and gait not back to baseline.  She is able to walk with a cane by herself but has to walk slowly and the right leg is stiff and slow.  She has had no falls or injuries.  She is also on amlodipine and blood pressure is under good control and Lipitor which is tolerating well without muscle aches and pains.  Her LDL was 41 mg percent and hemoglobin A1c 6.1 on 05/09/2021, she has a loop recorder in situ and so far paroxysmal A. fib has not yet been found  Update 08/19/2021: She returns for follow-up after last visit 3 months ago.  She is accompanied by husband.  Patient continues to have mild right hand diminished coordination and dexterity as well as some gait and balance difficulties.  She is able to walk short distances indoors without a cane but prefers using cane for long distances.  She has had no falls or injuries.  She is finished outpatient occupational therapy with has been doing hand exercises regularly at home.  She is still doing outpatient physical therapy but has to pay out-of-pocket.  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  She does get tired easily.  She is tolerating aspirin well without bleeding or bruising.  She states her blood pressure is under good control.  She is remains on Lipitor which is tolerating well without muscle aches and pain.  She had lipid profile checked a month ago by primary care physician and it was satisfactory.  She is planning on going on a trip to [place] at the end of this month.  She has no new complaints. Update 02/17/2022 : She returns  for follow-up after last visit 6 months ago.  She is accompanied by husband.  Patient continues to have some diminished fine motor skills in her hands and particularly bending during and little finger.  Her gait and balance have improved and she stopped using a cane now.  She still tends to drag her right leg which is stiff.  She has difficulties and fine motor skills like combing her hair.  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  She had lipid profile and hemoglobin A1c checked by primary physician 2 months ago and both were satisfactory.  She is tolerating Crestor well without side effects.  Blood pressures under good control today it is 11/69.  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  No significant flow-limiting stenosis with good distal flow.  Patient has no new complaints today Update 06/23/2022 : She returns for follow-up after last visit 4 months ago.  She is accompanied by her husband.  Patient states she is doing well.  She still has some mild right leg weakness and incoordination and some gait imbalance but uses a cane.  She has had no falls or injuries.  She remains on aspirin 81 mg daily which is tolerating well without bruising or side effects.  She is also tolerating Lipitor 40 mg well without muscle aches or pains.  Her blood pressure is under good control and today it is 118/76.  She still has absence of taste and wonders if this was caused by the stroke or her medications.  Last lipid profile checked on 12/21/2021 was optimal at LDL cholesterol 39 mg percent.  She has no new complaints today. Update 06/20/2023 : She returns for follow-up after a year ago.  She is accompanied by her husband.  She continues to have mild right-sided  weakness, spasticity and incoordination.  She can use the right hand but has to do activities slowly.  She can walk short distances but has to be very careful.  She can walk with a cane long distances.  She has had no recent falls or injuries.  She has had no recurrent stroke or TIA symptoms.  She remains on aspirin which she is tolerating well with minor bruising and no bleeding.  She is tolerating Lipitor well without muscle aches and pains.  Last lipid profile checked on 01/05/2023 showed LDL-cholesterol to be optimal at 51 mg percent.  Hemoglobin A1c on 06/24/2022 was 6.2.  Her loop recorder has so far not yet shown paroxysmal A-fib.  She remains on disability and has brought paperwork to be filled out today.  She has no new complaints. ROS:   14 system review of systems is positive for dizziness, imbalance, gait difficulty, diminished fine motor skills and all other systems negative and all systems negative PMH:  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)    Stroke (HCC)    Unspecified essential hypertension     Social History:  Social History   Socioeconomic History   Marital status: Married    Spouse name: Not on file   Number of children: 1   Years of education: Not on file   Highest education level: Not on file  Occupational History   Occupation: Science writer    Comment: self employed  Tobacco Use   Smoking status: Never   Smokeless tobacco: Never  Substance and Sexual Activity   Alcohol use: No   Drug use: No   Sexual activity: Yes  Other Topics Concern   Not on file  Social History Narrative   Lives with husband   Right Handed   Drinks caffeine seldom   Social Determinants of Health   Financial Resource Strain: Not on file  Food Insecurity: Not on file  Transportation Needs: No Transportation Needs (02/18/2020)    PRAPARE - Administrator, Civil Service (Medical): No    Lack of Transportation (Non-Medical): No  Physical Activity: Not on file  Stress: Not on file  Social Connections: Not on file  Intimate Partner Violence:  Not on file    Medications:   Current Outpatient Medications on File Prior to Visit  Medication Sig Dispense Refill   amLODipine (NORVASC) 5 MG tablet Take 1 tablet by mouth daily.     aspirin 81 MG EC tablet Take 1 tablet (81 mg total) by mouth daily. 90 tablet 1   atorvastatin (LIPITOR) 40 MG tablet Take 1 tablet (40 mg total) by mouth at bedtime. 30 tablet 2   Calcium Carbonate-Vit D-Min (CALCIUM 1200 PO) Take 1 tablet by mouth daily.     losartan (COZAAR) 50 MG tablet Take 1 tablet (50 mg total) by mouth daily. 30 tablet 2   No current facility-administered medications on file prior to visit.    Allergies:  No Known Allergies  Physical Exam General: Frail middle-aged Caucasian lady seated, in no evident distress Head: head normocephalic and atraumatic.  Neck: supple with no carotid or supraclavicular bruits Cardiovascular: regular rate and rhythm, no murmurs Musculoskeletal: no deformity Skin:  no rash/petichiae Vascular:  Normal pulses all extremities Vitals:   06/20/23 1535  BP: 133/79  Pulse: 75    Neurologic Exam Mental Status: Awake and fully alert. Oriented to place and time. Recent and remote memory intact. Attention span, concentration and fund of knowledge appropriate. Mood and affect appropriate.  Cranial Nerves: Fundoscopic exam not done s. Pupils equal, briskly reactive to light. Extraocular movements full without nystagmus. Visual fields full to confrontation. Hearing intact. Facial sensation intact. Face, tongue, palate moves normally and symmetrically.  Motor: Normal bulk and tone. Normal strength in all tested extremity muscles.  Except diminished fine finger movements on the right and mild right grip weakness.  Trace right hip flexor and  ankle dorsiflexor weakness.  Tone is increased on the right with some spasticity in the right leg. Sensory.: intact to touch ,pinprick .position and vibratory sensation.  Coordination: Rapid alternating movements are slightly impaired on the right proportionate to degree of weakness.   Gait and Station: Arises from chair without difficulty. Stance is normal.  Mildly spastic ataxic gait with stiffness and dragging of the right leg but no foot drop..  Reflexes: 2+ and asymmetric and brisker on the right. Toes downgoing.   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  Posterior circulation TIA on 05/09/2021 likely symptomatic from partially healed vertebral artery dissection.  Vascular risk factors of hyperlipidemia and vertebral artery dissection only.  Patient is doing well but has mild residual right-sided ataxia and gait imbalance.     PLAN: I had a long d/w patient and her husband about her cerebellar  stroke, residual gait imbalance,risk for recurrent stroke/TIAs, personally independently reviewed imaging studies and stroke evaluation results and answered questions.Continue aspirin 81 mg daily  for secondary stroke prevention and maintain strict control of hypertension with blood pressure goal below 130/90, diabetes with hemoglobin A1c goal below 6.5% and lipids with LDL cholesterol goal below 70 mg/dL. I also advised the patient to eat a healthy diet with plenty of whole grains, cereals, fruits and vegetables, exercise regularly and maintain ideal body weight . She was advised to use her cane at all times and avoid falls and injuries.Followup in the future with me in 1 year or call   if needed Greater than 50% of time during this  35-minute  isit was  spent on counseling,explanation of diagnosis, of TIA and vertebral artery dissection planning of further management, discussion with patient and family and coordination of care Delia Heady, MD  [facility] [address] [place], [place] [address]  Phone [phone] Fax [phone] Note: This document was prepared with digital dictation and possible smart phrase technology. Any transcriptional errors that result from this process are unintentional

## 2023-06-20 NOTE — Patient Instructions (Signed)
I had a long d/w patient and her husband about her cerebellar  stroke, residual gait imbalance,risk for recurrent stroke/TIAs, personally independently reviewed imaging studies and stroke evaluation results and answered questions.Continue aspirin 81 mg daily  for secondary stroke prevention and maintain strict control of hypertension with blood pressure goal below 130/90, diabetes with hemoglobin A1c goal below 6.5% and lipids with LDL cholesterol goal below 70 mg/dL. I also advised the patient to eat a healthy diet with plenty of whole grains, cereals, fruits and vegetables, exercise regularly and maintain ideal body weight . She was advised to use her cane at all times and avoid falls and injuries.Followup in the future with me in 1 year or call   if needed

## 2023-06-21 DIAGNOSIS — Z0289 Encounter for other administrative examinations: Secondary | ICD-10-CM

## 2023-06-26 ENCOUNTER — Ambulatory Visit: Payer: 59 | Admitting: Family Medicine

## 2023-06-26 ENCOUNTER — Other Ambulatory Visit: Payer: Self-pay | Admitting: Family Medicine

## 2023-06-26 ENCOUNTER — Encounter: Payer: Self-pay | Admitting: Family Medicine

## 2023-06-26 VITALS — BP 128/80 | HR 75 | Temp 98.2°F | Resp 16 | Ht 62.0 in | Wt 122.6 lb

## 2023-06-26 DIAGNOSIS — R739 Hyperglycemia, unspecified: Secondary | ICD-10-CM

## 2023-06-26 DIAGNOSIS — E785 Hyperlipidemia, unspecified: Secondary | ICD-10-CM

## 2023-06-26 DIAGNOSIS — I63441 Cerebral infarction due to embolism of right cerebellar artery: Secondary | ICD-10-CM | POA: Diagnosis not present

## 2023-06-26 DIAGNOSIS — Z8673 Personal history of transient ischemic attack (TIA), and cerebral infarction without residual deficits: Secondary | ICD-10-CM

## 2023-06-26 DIAGNOSIS — I639 Cerebral infarction, unspecified: Secondary | ICD-10-CM

## 2023-06-26 DIAGNOSIS — I7774 Dissection of vertebral artery: Secondary | ICD-10-CM

## 2023-06-26 DIAGNOSIS — I10 Essential (primary) hypertension: Secondary | ICD-10-CM

## 2023-06-26 LAB — COMPREHENSIVE METABOLIC PANEL
ALT: 35 U/L (ref 0–35)
AST: 39 U/L — ABNORMAL HIGH (ref 0–37)
Albumin: 3.9 g/dL (ref 3.5–5.2)
Alkaline Phosphatase: 102 U/L (ref 39–117)
BUN: 15 mg/dL (ref 6–23)
CO2: 31 meq/L (ref 19–32)
Calcium: 9.2 mg/dL (ref 8.4–10.5)
Chloride: 106 meq/L (ref 96–112)
Creatinine, Ser: 0.58 mg/dL (ref 0.40–1.20)
GFR: 97.39 mL/min (ref >60.00)
Glucose, Bld: 95 mg/dL (ref 70–99)
Potassium: 4.7 meq/L (ref 3.5–5.1)
Sodium: 142 meq/L (ref 135–145)
Total Bilirubin: 1.1 mg/dL (ref 0.2–1.2)
Total Protein: 7.2 g/dL (ref 6.0–8.3)

## 2023-06-26 LAB — VITAMIN D 25 HYDROXY (VIT D DEFICIENCY, FRACTURES): VITD: 26.9 ng/mL — ABNORMAL LOW (ref 30.00–100.00)

## 2023-06-26 LAB — VITAMIN B12: Vitamin B-12: 233 pg/mL (ref 211–911)

## 2023-06-26 LAB — HEMOGLOBIN A1C: Hgb A1c MFr Bld: 6.1 % (ref 4.6–6.5)

## 2023-06-26 NOTE — Progress Notes (Signed)
Established Patient Office Visit  Subjective   Patient ID: Judy Gutierrez, female    DOB: 1961-01-11  Age: 62 y.o. MRN: [id number]  Chief Complaint  Patient presents with   Hypertension   Hyperlipidemia   Follow-up    HPI  Pt is here today for f/u from stroke, bp and cholesterol.  No new complaints.   She is requesting a flu shot as well and needs paperwork filled out for her disability.    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 coli UTI 11/01/2013   Anemia due to blood loss, acute 10/24/2013   Pelvic fracture (HCC) 10/23/2013   Hyperglycemia 09/18/2013   Preventative health care 09/18/2013   Elevated alkaline phosphatase level 09/18/2013   Hot flashes, menopausal 04/10/2013   Pain in joint, upper arm 04/10/2013   Past Medical History:  Diagnosis Date   Disturbance of skin sensation    Myalgia and myositis, unspecified    Other B-complex deficiencies     Other disorders of bone and cartilage(733.99)    Stroke (HCC)    Unspecified essential hypertension    Past Surgical History:  Procedure Laterality Date   BUBBLE STUDY  03/05/2020   Procedure: BUBBLE STUDY;  Surgeon: [name], MD;  Location: [facility] ENDOSCOPY;  Service: Cardiovascular;;   CESAREAN SECTION  1992   IR ANGIO INTRA EXTRACRAN SEL COM CAROTID INNOMINATE BILAT MOD SED  05/11/2021   IR ANGIO VERTEBRAL SEL SUBCLAVIAN INNOMINATE BILAT MOD SED  05/11/2021   LOOP RECORDER INSERTION N/A 03/05/2020   Procedure: LOOP RECORDER INSERTION;  Surgeon: [name], MD;  Location: [facility] INVASIVE CV LAB;  Service: Cardiovascular;  Laterality: N/A;   TEE WITHOUT CARDIOVERSION N/A 03/05/2020   Procedure: TRANSESOPHAGEAL ECHOCARDIOGRAM (TEE);  Surgeon: [name], MD;  Location: [facility] ENDOSCOPY;  Service: Cardiovascular;  Laterality: N/A;   Social History   Tobacco Use   Smoking status: Never   Smokeless tobacco: Never  Substance Use Topics   Alcohol use: No   Drug use: No   Social History   Socioeconomic History   Marital status: Married    Spouse name: Not on file   Number of children: 1   Years of education: Not on file   Highest education level: Not on file  Occupational History   Occupation: convenience store    Comment: self employed  Tobacco Use   Smoking status: Never   Smokeless  tobacco: Never  Substance and Sexual Activity   Alcohol use: No   Drug use: No   Sexual activity: Yes  Other Topics Concern   Not on file  Social History Narrative   Lives with husband   Right Handed   Drinks caffeine seldom   Social Determinants of Health   Financial Resource Strain: Not on file  Food Insecurity: Not on file  Transportation Needs: No Transportation Needs (02/18/2020)   PRAPARE - Administrator, Civil Service (Medical): No    Lack of Transportation (Non-Medical): No  Physical Activity: Not on file  Stress: Not on file  Social Connections:  Not on file  Intimate Partner Violence: Not on file   Family Status  Relation Name Status   Mother  Deceased   Father  Deceased   Sister MITA Alive   Sister [facility] Alive   Sister Charu Alive   Son [employer] Alive  No partnership data on file   Family History  Problem Relation Age of Onset   Pulmonary embolism Mother    Hepatitis Father    Diabetes Father    Breast cancer Sister 69   Diabetes Mellitus II Sister    No Known Allergies    Review of Systems  Constitutional:  Negative for chills, fever and malaise/fatigue.  HENT:  Negative for congestion and hearing loss.   Eyes:  Negative for blurred vision and discharge.  Respiratory:  Negative for cough, sputum production and shortness of breath.   Cardiovascular:  Negative for chest pain, palpitations and leg swelling.  Gastrointestinal:  Negative for abdominal pain, blood in stool, constipation, diarrhea, heartburn, nausea and vomiting.  Genitourinary:  Negative for dysuria, frequency, hematuria and urgency.  Musculoskeletal:  Negative for back pain, falls and myalgias.  Skin:  Negative for rash.  Neurological:  Negative for dizziness, sensory change, loss of consciousness, weakness and headaches.  Endo/Heme/Allergies:  Negative for environmental allergies. Does not bruise/bleed easily.  Psychiatric/Behavioral:  Negative for depression and suicidal ideas. The patient is not nervous/anxious and does not have insomnia.       Objective:     BP 128/80 (BP Location: Left Arm, Patient Position: Sitting, Cuff Size: Normal)   Pulse 75   Temp 98.2 F (36.8 C) (Oral)   Resp 16   Ht 5\' 2"  (1.575 m)   Wt 122 lb 9.6 oz (55.6 kg)   LMP 12/08/2010   SpO2 99%   BMI 22.42 kg/m  BP Readings from Last 3 Encounters:  06/26/23 128/80  06/20/23 133/79  01/05/23 (!) 130/98   Wt Readings from Last 3 Encounters:  06/26/23 122 lb 9.6 oz (55.6 kg)  06/20/23 125 lb (56.7 kg)  01/05/23 121 lb 3.2 oz (55 kg)   SpO2 Readings from Last 3  Encounters:  06/26/23 99%  01/05/23 98%  06/24/22 97%      Physical Exam Vitals and nursing note reviewed.  Constitutional:      General: She is not in acute distress.    Appearance: Normal appearance. She is well-developed.  HENT:     Head: Normocephalic and atraumatic.  Eyes:     General: No scleral icterus.       Right eye: No discharge.        Left eye: No discharge.  Cardiovascular:     Rate and Rhythm: Normal rate and regular rhythm.     Heart sounds: No murmur heard. Pulmonary:     Effort: Pulmonary effort is normal. No respiratory distress.  Breath sounds: Normal breath sounds.  Musculoskeletal:        General: Normal range of motion.     Cervical back: Normal range of motion and neck supple.     Right lower leg: No edema.     Left lower leg: No edema.  Skin:    General: Skin is warm and dry.  Neurological:     General: No focal deficit present.     Mental Status: She is alert and oriented to person, place, and time.  Psychiatric:        Mood and Affect: Mood normal.        Behavior: Behavior normal.        Thought Content: Thought content normal.        Judgment: Judgment normal.      No results found for any visits on 06/26/23.  Last CBC Lab Results  Component Value Date   WBC 6.6 01/05/2023   HGB 12.5 01/05/2023   HCT 37.4 01/05/2023   MCV 87.9 01/05/2023   MCH 30.4 05/09/2021   RDW 12.9 01/05/2023   PLT 258.0 01/05/2023   Last metabolic panel Lab Results  Component Value Date   GLUCOSE 111 (H) 01/05/2023   NA 141 01/05/2023   K 4.3 01/05/2023   CL 104 01/05/2023   CO2 28 01/05/2023   BUN 21 01/05/2023   CREATININE 0.68 01/05/2023   GFR 94.04 01/05/2023   CALCIUM 9.5 01/05/2023   PHOS 3.6 02/23/2021   PROT 7.7 01/05/2023   ALBUMIN 4.2 01/05/2023   LABGLOB 3.2 09/09/2015   AGRATIO 1.3 09/09/2015   BILITOT 1.3 (H) 01/05/2023   ALKPHOS 98 01/05/2023   AST 49 (H) 01/05/2023   ALT 34 01/05/2023   ANIONGAP 7 05/09/2021   Last  lipids Lab Results  Component Value Date   CHOL 114 01/05/2023   HDL 51.50 01/05/2023   LDLCALC 51 01/05/2023   TRIG 59.0 01/05/2023   CHOLHDL 2 01/05/2023   Last hemoglobin A1c Lab Results  Component Value Date   HGBA1C 6.2 06/24/2022   Last thyroid functions Lab Results  Component Value Date   TSH 1.72 01/05/2023   Last vitamin D No results found for: "25OHVITD2", "25OHVITD3", "VD25OH" Last vitamin B12 and Folate No results found for: "VITAMINB12", "FOLATE"    The ASCVD Risk score (Arnett DK, et al., 2019) failed to calculate for the following reasons:   The patient has a prior MI or stroke diagnosis    Assessment & Plan:   Problem List Items Addressed This Visit       Unprioritized   Hyperglycemia   Relevant Orders   Comprehensive metabolic panel   Hemoglobin A1c   Hemoglobin A1c   Primary hypertension - Primary   Relevant Orders   Hemoglobin A1c   Vitamin B12   VITAMIN D 25 Hydroxy (Vit-D Deficiency, Fractures)   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 Increase exercise as tolerated  Relevant Orders   Comprehensive metabolic panel   Essential hypertension    Well controlled, no changes to meds. Encouraged heart healthy diet such as the DASH diet and exercise as tolerated.        Cryptogenic stroke (HCC)    Stable Still has neuro and cardio f/u     Assessment and Plan             No follow-ups on file.    [name], DO

## 2023-06-26 NOTE — Assessment & Plan Note (Signed)
Well controlled, no changes to meds. Encouraged heart healthy diet such as the DASH diet and exercise as tolerated.  °

## 2023-06-26 NOTE — Assessment & Plan Note (Signed)
Still has R sided weakness in upper and low extremity

## 2023-06-26 NOTE — Assessment & Plan Note (Signed)
Tolerating statin, encouraged heart healthy diet, avoid trans fats, minimize simple carbs and saturated fats. Increase exercise as tolerated 

## 2023-06-26 NOTE — Assessment & Plan Note (Signed)
Stable Still has neuro and cardio f/u

## 2023-06-27 LAB — CBC
HCT: 36.9 % (ref 35.0–45.0)
Hemoglobin: 12.2 g/dL (ref 11.7–15.5)
MCH: 28.9 pg (ref 27.0–33.0)
MCHC: 33.1 g/dL (ref 32.0–36.0)
MCV: 87.4 fL (ref 80.0–100.0)
MPV: 10 fL (ref 7.5–12.5)
Platelets: 245 10*3/uL (ref 140–400)
RBC: 4.22 10*6/uL (ref 3.80–5.10)
RDW: 12.3 % (ref 11.0–15.0)
WBC: 3.9 10*3/uL (ref 3.8–10.8)

## 2023-06-27 LAB — PATHOLOGIST SMEAR REVIEW

## 2023-06-28 ENCOUNTER — Other Ambulatory Visit: Payer: Self-pay

## 2023-06-28 MED ORDER — VITAMIN D (ERGOCALCIFEROL) 1.25 MG (50000 UNIT) PO CAPS: 50000.0000 [IU] | ORAL_CAPSULE | ORAL | 1 refills | Status: AC

## 2023-06-29 ENCOUNTER — Telehealth: Payer: Self-pay | Admitting: Family Medicine

## 2023-06-29 ENCOUNTER — Other Ambulatory Visit: Payer: Self-pay

## 2023-06-29 DIAGNOSIS — E785 Hyperlipidemia, unspecified: Secondary | ICD-10-CM

## 2023-06-29 NOTE — Telephone Encounter (Signed)
Pt called to go over labs. All CMAs were unavailable at the time and advised pt a note would be sent back to call her back later.

## 2023-06-29 NOTE — Telephone Encounter (Signed)
Pt called. Made aware of results. Pt advised that lipid panel was not done. Spoke with Nicki [place]. Add on request sent to [place] to have labs drawn at [facility].

## 2023-06-30 ENCOUNTER — Other Ambulatory Visit: Payer: 59

## 2023-06-30 DIAGNOSIS — E785 Hyperlipidemia, unspecified: Secondary | ICD-10-CM

## 2023-06-30 NOTE — Addendum Note (Signed)
Addended by: Mervin Kung A on: 06/30/2023 08:45 AM   Modules accepted: Orders

## 2023-07-01 LAB — LIPID PANEL
Cholesterol: 90 mg/dL (ref <200)
HDL: 62 mg/dL (ref >=50)
LDL Cholesterol (Calc): 16 mg/dL
Non-HDL Cholesterol (Calc): 28 mg/dL (ref <130)
Total CHOL/HDL Ratio: 1.5 (calc) (ref <5.0)
Triglycerides: 41 mg/dL (ref <150)

## 2023-07-03 ENCOUNTER — Ambulatory Visit: Payer: 59 | Admitting: Neurology

## 2023-07-25 ENCOUNTER — Telehealth: Payer: Self-pay | Admitting: Family Medicine

## 2023-07-25 NOTE — Telephone Encounter (Signed)
Patient needs a letter for jury duty stating that she is handicapped and does not have transportation. Her husband has to drive her everywhere. Her right hand and leg doesn't work well. Pt would like letter mailed to her home. Please call if there are any issues.

## 2023-07-26 ENCOUNTER — Encounter: Payer: Self-pay | Admitting: Family Medicine

## 2023-07-26 NOTE — Telephone Encounter (Signed)
Pt called back with the info:  Juror #: [id number] Date: 11.21.24

## 2023-07-26 NOTE — Telephone Encounter (Signed)
Letter placed in outgoing mail.

## 2023-08-07 ENCOUNTER — Encounter: Payer: Self-pay | Admitting: Family Medicine

## 2023-08-07 ENCOUNTER — Ambulatory Visit: Payer: 59 | Admitting: Family Medicine

## 2023-08-07 VITALS — BP 122/72 | HR 97 | Temp 97.9°F | Resp 16 | Ht 62.0 in | Wt 123.6 lb

## 2023-08-07 DIAGNOSIS — R350 Frequency of micturition: Secondary | ICD-10-CM

## 2023-08-07 DIAGNOSIS — N390 Urinary tract infection, site not specified: Secondary | ICD-10-CM | POA: Diagnosis not present

## 2023-08-07 DIAGNOSIS — E559 Vitamin D deficiency, unspecified: Secondary | ICD-10-CM

## 2023-08-07 DIAGNOSIS — R319 Hematuria, unspecified: Secondary | ICD-10-CM

## 2023-08-07 LAB — POC URINALSYSI DIPSTICK (AUTOMATED)
Bilirubin, UA: NEGATIVE
Glucose, UA: NEGATIVE
Ketones, UA: NEGATIVE
Nitrite, UA: NEGATIVE
Protein, UA: NEGATIVE
Spec Grav, UA: <=1.005 — AB (ref 1.010–1.025)
Urobilinogen, UA: 0.2 U/dL
pH, UA: 7.5 (ref 5.0–8.0)

## 2023-08-07 MED ORDER — CIPROFLOXACIN HCL 250 MG PO TABS: 250.0000 mg | ORAL_TABLET | Freq: Two times a day (BID) | ORAL | 0 refills | Status: AC

## 2023-08-07 NOTE — Progress Notes (Signed)
````````````````````````````````````````````````````````````````````````````````````````````````````````````````````````````````````````````````````````````````````````````````  Established Patient Office Visit  Subjective   Patient ID: Judy Gutierrez, female    DOB: 1961/06/24  Age: 62 y.o. MRN: [id number]  Chief Complaint  Patient presents with   Urinary Frequency    Pt states having frequency. Pt states she went to [facility] 07/11/23 and was dx with a UTI. Pt was given Cipro    HPI Discussed the use of AI scribe software for clinical note transcription with the patient, who gave verbal consent to proceed.  History of Present Illness   The patient, with a history of recurrent urinary tract infections (UTIs), presents with symptoms suggestive of another UTI. She reports urinary frequency, hematuria, and dysuria that started two days ago. The patient had a similar episode a month ago, which was treated with a five-day course of Ciprofloxacin from an urgent care center. The symptoms resolved after the second day of treatment. The patient also had a UTI in March, marking this as her third episode this year.  In addition to the UTI symptoms, the patient has been taking a prescribed high-dose Vitamin D supplement daily instead of the intended weekly dose. She has been on this regimen for over a month since her last visit.       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 coli UTI 11/01/2013   Anemia due to blood loss, acute 10/24/2013   Pelvic fracture (HCC) 10/23/2013   Hyperglycemia 09/18/2013   Preventative health care 09/18/2013   Elevated alkaline phosphatase level 09/18/2013   Hot flashes, menopausal 04/10/2013   Pain in joint, upper arm 04/10/2013   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)    Stroke (HCC)    Unspecified essential hypertension    Past Surgical History:  Procedure Laterality Date   BUBBLE STUDY  03/05/2020   Procedure: BUBBLE STUDY;  Surgeon: [name], MD;  Location: [facility] ENDOSCOPY;  Service: Cardiovascular;;   CESAREAN SECTION  1992   IR ANGIO INTRA EXTRACRAN SEL COM CAROTID INNOMINATE BILAT MOD SED  05/11/2021   IR ANGIO VERTEBRAL SEL SUBCLAVIAN INNOMINATE BILAT MOD SED  05/11/2021   LOOP RECORDER INSERTION N/A 03/05/2020   Procedure: LOOP RECORDER INSERTION;  Surgeon: [name], MD;  Location: [facility] INVASIVE CV LAB;  Service: Cardiovascular;  Laterality: N/A;   TEE WITHOUT CARDIOVERSION N/A 03/05/2020  Procedure: TRANSESOPHAGEAL ECHOCARDIOGRAM (TEE);  Surgeon: [name], MD;  Location: [facility] ENDOSCOPY;  Service: Cardiovascular;  Laterality: N/A;   Social History   Tobacco Use   Smoking status: Never   Smokeless tobacco: Never  Substance Use Topics   Alcohol use: No   Drug use: No   Social  History   Socioeconomic History   Marital status: Married    Spouse name: Not on file   Number of children: 1   Years of education: Not on file   Highest education level: Not on file  Occupational History   Occupation: convenience store    Comment: self employed  Tobacco Use   Smoking status: Never   Smokeless tobacco: Never  Substance and Sexual Activity   Alcohol use: No   Drug use: No   Sexual activity: Yes  Other Topics Concern   Not on file  Social History Narrative   Lives with husband   Right Handed   Drinks caffeine seldom   Social Determinants of Health   Financial Resource Strain: Not on file  Food Insecurity: Not on file  Transportation Needs: No Transportation Needs (02/18/2020)   PRAPARE - Administrator, Civil Service (Medical): No    Lack of Transportation (Non-Medical): No  Physical Activity: Not on file  Stress: Not on file  Social Connections: Not on file  Intimate Partner Violence: Not on file   Family Status  Relation Name Status   Mother  Deceased   Father  Deceased   Sister MITA Alive   Sister [facility] Alive   Sister Charu Alive   Son [employer] Alive  No partnership data on file   Family History  Problem Relation Age of Onset   Pulmonary embolism Mother    Hepatitis Father    Diabetes Father    Breast cancer Sister 98   Diabetes Mellitus II Sister    No Known Allergies    Review of Systems  Constitutional:  Negative for fever and malaise/fatigue.  HENT:  Negative for congestion.   Eyes:  Negative for blurred vision.  Respiratory:  Negative for shortness of breath.   Cardiovascular:  Negative for chest pain, palpitations and leg swelling.  Gastrointestinal:  Negative for abdominal pain, blood in stool and nausea.  Genitourinary:  Positive for frequency and hematuria. Negative for dysuria.  Musculoskeletal:  Negative for falls.  Skin:  Negative for rash.  Neurological:  Negative for dizziness, loss of consciousness and  headaches.  Endo/Heme/Allergies:  Negative for environmental allergies.  Psychiatric/Behavioral:  Negative for depression. The patient is not nervous/anxious.       Objective:     BP 122/72 (BP Location: Left Arm, Patient Position: Sitting, Cuff Size: Normal)   Pulse 97   Temp 97.9 F (36.6 C) (Oral)   Resp 16   Ht 5\' 2"  (1.575 m)   Wt 123 lb 9.6 oz (56.1 kg)   LMP 12/08/2010   SpO2 98%   BMI 22.61 kg/m  BP Readings from Last 3 Encounters:  08/07/23 122/72  06/26/23 128/80  06/20/23 133/79   Wt Readings from Last 3 Encounters:  08/07/23 123 lb 9.6 oz (56.1 kg)  06/26/23 122 lb 9.6 oz (55.6 kg)  06/20/23 125 lb (56.7 kg)   SpO2 Readings from Last 3 Encounters:  08/07/23 98%  06/26/23 99%  01/05/23 98%      Physical Exam Vitals and nursing note reviewed.  Constitutional:      General: She is not in acute distress.  Appearance: Normal appearance. She is well-developed.  HENT:     Head: Normocephalic and atraumatic.  Eyes:     General: No scleral icterus.       Right eye: No discharge.        Left eye: No discharge.  Cardiovascular:     Rate and Rhythm: Normal rate and regular rhythm.     Heart sounds: No murmur heard. Pulmonary:     Effort: Pulmonary effort is normal. No respiratory distress.     Breath sounds: Normal breath sounds.  Musculoskeletal:        General: Normal range of motion.     Cervical back: Normal range of motion and neck supple.     Right lower leg: No edema.     Left lower leg: No edema.  Skin:    General: Skin is warm and dry.  Neurological:     Mental Status: She is alert and oriented to person, place, and time.  Psychiatric:        Mood and Affect: Mood normal.        Behavior: Behavior normal.        Thought Content: Thought content normal.        Judgment: Judgment normal.      Results for orders placed or performed in visit on 08/07/23  POCT Urinalysis Dipstick (Automated)  Result Value Ref Range   Color, UA yellow     Clarity, UA hazy    Glucose, UA Negative Negative   Bilirubin, UA negative    Ketones, UA negative    Spec Grav, UA <=1.005 (A) 1.010 - 1.025   Blood, UA moderate    pH, UA 7.5 5.0 - 8.0   Protein, UA Negative Negative   Urobilinogen, UA 0.2 0.2 or 1.0 E.U./dL   Nitrite, UA negative    Leukocytes, UA Moderate (2+) (A) Negative    Last CBC Lab Results  Component Value Date   WBC 3.9 06/26/2023   HGB 12.2 06/26/2023   HCT 36.9 06/26/2023   MCV 87.4 06/26/2023   MCH 28.9 06/26/2023   RDW 12.3 06/26/2023   PLT 245 06/26/2023   Last metabolic panel Lab Results  Component Value Date   GLUCOSE 95 06/26/2023   NA 142 06/26/2023   K 4.7 06/26/2023   CL 106 06/26/2023   CO2 31 06/26/2023   BUN 15 06/26/2023   CREATININE 0.58 06/26/2023   GFR 97.39 06/26/2023   CALCIUM 9.2 06/26/2023   PHOS 3.6 02/23/2021   PROT 7.2 06/26/2023   ALBUMIN 3.9 06/26/2023   LABGLOB 3.2 09/09/2015   AGRATIO 1.3 09/09/2015   BILITOT 1.1 06/26/2023   ALKPHOS 102 06/26/2023   AST 39 (H) 06/26/2023   ALT 35 06/26/2023   ANIONGAP 7 05/09/2021   Last lipids Lab Results  Component Value Date   CHOL 90 06/30/2023   HDL 62 06/30/2023   LDLCALC 16 06/30/2023   TRIG 41 06/30/2023   CHOLHDL 1.5 06/30/2023   Last hemoglobin A1c Lab Results  Component Value Date   HGBA1C 6.1 06/26/2023   Last thyroid functions Lab Results  Component Value Date   TSH 1.72 01/05/2023   Last vitamin D Lab Results  Component Value Date   VD25OH 26.90 (L) 06/26/2023   Last vitamin B12 and Folate Lab Results  Component Value Date   VITAMINB12 233 06/26/2023      The ASCVD Risk score (Arnett DK, et al., 2019) failed to calculate for the following reasons:   The  patient has a prior MI or stroke diagnosis    Assessment & Plan:   Problem List Items Addressed This Visit   None Visit Diagnoses     Urinary frequency    -  Primary   Relevant Medications   ciprofloxacin (CIPRO) 250 MG tablet   Other  Relevant Orders   POCT Urinalysis Dipstick (Automated) (Completed)   Urine Culture   Vitamin D deficiency       Relevant Orders   VITAMIN D 25 Hydroxy (Vit-D Deficiency, Fractures)   Recurrent UTI       Relevant Medications   ciprofloxacin (CIPRO) 250 MG tablet   Other Relevant Orders   Ambulatory referral to Urology   Hematuria, unspecified type       Relevant Orders   Ambulatory referral to Urology     Assessment and Plan    Recurrent Urinary Tract Infections Third episode this year with symptoms of frequency, hematuria, and dysuria. Urinalysis pending. -Start Ciprofloxacin. -Refer to Urology for further evaluation due to recurrent infections.  Vitamin D Supplementation Patient was taking 50,000 IU daily instead of weekly. -Check Vitamin D level today to assess for potential toxicity. -Re-educate patient on correct dosing frequency.        No follow-ups on file.    [name], DO

## 2023-08-08 ENCOUNTER — Other Ambulatory Visit: Payer: Self-pay | Admitting: Family Medicine

## 2023-08-08 DIAGNOSIS — Z1231 Encounter for screening mammogram for malignant neoplasm of breast: Secondary | ICD-10-CM

## 2023-08-08 LAB — URINE CULTURE
MICRO NUMBER:: 15620583
Result:: NO GROWTH
SPECIMEN QUALITY:: ADEQUATE

## 2023-08-18 ENCOUNTER — Other Ambulatory Visit (INDEPENDENT_AMBULATORY_CARE_PROVIDER_SITE_OTHER): Payer: Medicare Other

## 2023-08-18 DIAGNOSIS — E559 Vitamin D deficiency, unspecified: Secondary | ICD-10-CM

## 2023-08-18 DIAGNOSIS — I10 Essential (primary) hypertension: Secondary | ICD-10-CM

## 2023-08-18 DIAGNOSIS — R739 Hyperglycemia, unspecified: Secondary | ICD-10-CM

## 2023-08-18 DIAGNOSIS — E785 Hyperlipidemia, unspecified: Secondary | ICD-10-CM

## 2023-08-18 NOTE — Addendum Note (Signed)
Addended by: Thelma Barge D on: 08/18/2023 02:30 PM   Modules accepted: Orders

## 2023-08-18 NOTE — Addendum Note (Signed)
Addended by: Thelma Barge D on: 08/18/2023 02:29 PM   Modules accepted: Orders

## 2023-08-18 NOTE — Addendum Note (Signed)
Addended by: Thelma Barge D on: 08/18/2023 02:26 PM   Modules accepted: Orders

## 2023-08-19 LAB — VITAMIN D 25 HYDROXY (VIT D DEFICIENCY, FRACTURES): Vit D, 25-Hydroxy: 89 ng/mL (ref 30–100)

## 2023-08-23 ENCOUNTER — Telehealth: Payer: Self-pay

## 2023-08-23 NOTE — Telephone Encounter (Signed)
Pt made aware

## 2023-08-23 NOTE — Telephone Encounter (Signed)
Pt called and would like to know if she should continue the vitamin D. Please advise

## 2023-09-04 ENCOUNTER — Ambulatory Visit (INDEPENDENT_AMBULATORY_CARE_PROVIDER_SITE_OTHER): Payer: Medicare Other | Admitting: Urology

## 2023-09-04 ENCOUNTER — Encounter: Payer: Self-pay | Admitting: Urology

## 2023-09-04 VITALS — BP 143/82 | HR 83 | Ht 62.0 in | Wt 119.0 lb

## 2023-09-04 DIAGNOSIS — Z87440 Personal history of urinary (tract) infections: Secondary | ICD-10-CM

## 2023-09-04 DIAGNOSIS — R399 Unspecified symptoms and signs involving the genitourinary system: Secondary | ICD-10-CM

## 2023-09-04 DIAGNOSIS — N3001 Acute cystitis with hematuria: Secondary | ICD-10-CM

## 2023-09-04 DIAGNOSIS — Z87898 Personal history of other specified conditions: Secondary | ICD-10-CM | POA: Diagnosis not present

## 2023-09-04 DIAGNOSIS — Z09 Encounter for follow-up examination after completed treatment for conditions other than malignant neoplasm: Secondary | ICD-10-CM | POA: Diagnosis not present

## 2023-09-04 DIAGNOSIS — N952 Postmenopausal atrophic vaginitis: Secondary | ICD-10-CM

## 2023-09-04 MED ORDER — ESTRADIOL 0.1 MG/GM VA CREA: TOPICAL_CREAM | VAGINAL | 3 refills | Status: AC

## 2023-09-04 NOTE — Progress Notes (Signed)
Chief Complaint: No chief complaint on file.   History of Present Illness:  Judy Gutierrez is a 62 y.o. female who is seen in consultation from Zola Button, Grayling Congress, DO for evaluation of recurrent urinary infections (2 treated UTIs within the past year) as well as hematuria.  She has no longstanding history of urinary tract infections.  Her UTIs were marked with urinary frequency, minimal dysuria.  No fevers, no abdominal pain.  The blood that she had was actually on her toilet tissue, and not in the water.  Symptoms easily abated with short courses of antibiotics.  The last UTI was actually treated at an outpatient urgent care over a weekend.  She is sexually active occasionally.  Infections are not associated with sexual activity.  She does have mild discomfort with intercourse.   Past Medical History:  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)    Stroke [facility])    Unspecified essential hypertension     Past Surgical History:  Past Surgical History:  Procedure Laterality Date   BUBBLE STUDY  03/05/2020   Procedure: BUBBLE STUDY;  Surgeon: [name], MD;  Location: [facility] ENDOSCOPY;  Service: Cardiovascular;;   CESAREAN SECTION  1992   IR ANGIO INTRA EXTRACRAN SEL COM CAROTID INNOMINATE BILAT MOD SED  05/11/2021   IR ANGIO VERTEBRAL SEL SUBCLAVIAN INNOMINATE BILAT MOD SED  05/11/2021   LOOP RECORDER INSERTION N/A 03/05/2020   Procedure: LOOP RECORDER INSERTION;  Surgeon: [name], MD;  Location: [facility] INVASIVE CV LAB;  Service: Cardiovascular;  Laterality: N/A;   TEE WITHOUT CARDIOVERSION N/A 03/05/2020   Procedure: TRANSESOPHAGEAL ECHOCARDIOGRAM (TEE);  Surgeon: [name], MD;  Location: [facility] ENDOSCOPY;  Service: Cardiovascular;  Laterality: N/A;    Allergies:  No Known Allergies  Family History:  Family  History  Problem Relation Age of Onset   Pulmonary embolism Mother    Hepatitis Father    Diabetes Father    Breast cancer Sister 7   Diabetes Mellitus II Sister     Social History:  Social History   Tobacco Use   Smoking status: Never   Smokeless tobacco: Never  Substance Use Topics   Alcohol use: No   Drug use: No    Review of symptoms:  Constitutional:  Negative for unexplained weight loss, night sweats, fever, chills ENT:  Negative for nose bleeds, sinus pain, painful swallowing CV:  Negative for chest pain, shortness of breath, exercise intolerance, palpitations, loss of consciousness Resp:  Negative for cough, wheezing, shortness of breath GI:  Negative for nausea, vomiting, diarrhea, bloody stools GU:  Positives noted in HPI; otherwise negative for gross hematuria, dysuria, urinary incontinence Neuro:  Negative for seizures, poor balance, limb weakness, slurred speech Psych:  Negative for lack of energy, depression, anxiety Endocrine:  Negative for polydipsia, polyuria, symptoms of hypoglycemia (dizziness, hunger, sweating) Hematologic:  Negative for anemia, purpura, petechia, prolonged or excessive bleeding, use of anticoagulants  Allergic:  Negative for difficulty breathing or choking as a result of exposure to anything; no shellfish allergy; no allergic response (rash/itch) to materials, foods  Physical exam: LMP 12/08/2010  GENERAL APPEARANCE:  Well appearing, well developed, well nourished, NAD HEENT: Atraumatic, Normocephalic, oropharynx clear. NECK: Supple without lymphadenopathy or thyromegaly. LUNGS: Clear to auscultation bilaterally. HEART: Regular Rate and Rhythm without murmurs, gallops, or rubs. EXTREMITIES:  Moves all extremities well.  Without clubbing, cyanosis, or edema. NEUROLOGIC:  Alert and oriented x 3, normal gait, CN II-XII grossly intact.  MENTAL STATUS:  Appropriate. BACK:  Non-tender to palpation.  No CVAT SKIN:  Warm, dry and intact.     Results: No results found for this or any previous visit (from the past 24 hour(s)).    I have reviewed referring/prior physicians records--most recent visit from October 21  I have reviewed urinalysis--clear  I have reviewed prior urine cultures--only urine culture result was from the 21st of last month that was negative  No prior renal imaging available  Assessment: 1.  Microscopic hematuria noted with UTIs, not noted otherwise  2.  Urinary tract infections x 2, most likely secondary to atrophic changes   Plan: 1.  Patient was reassured about her current symptoms.  I do not think she needs further diagnostic studies at this time  2.  I will put her on perivaginal estrogen cream 2 nights a week.  This will be most likely diminish risk of UTIs as well as make intercourse more tolerable  3.  Office visit as needed

## 2023-09-06 LAB — URINALYSIS, ROUTINE W REFLEX MICROSCOPIC
Bilirubin, UA: NEGATIVE
Glucose, UA: NEGATIVE
Ketones, UA: NEGATIVE
Leukocytes,UA: NEGATIVE
Nitrite, UA: NEGATIVE
Protein,UA: NEGATIVE
RBC, UA: NEGATIVE
Specific Gravity, UA: 1.025 (ref 1.005–1.030)
Urobilinogen, Ur: 0.2 mg/dL (ref 0.2–1.0)
pH, UA: 7 (ref 5.0–7.5)

## 2023-09-21 ENCOUNTER — Ambulatory Visit (INDEPENDENT_AMBULATORY_CARE_PROVIDER_SITE_OTHER): Payer: Medicare Other

## 2023-09-21 DIAGNOSIS — I639 Cerebral infarction, unspecified: Secondary | ICD-10-CM | POA: Diagnosis not present

## 2023-09-21 LAB — CUP PACEART REMOTE DEVICE CHECK
Date Time Interrogation Session: 20241201230127
Implantable Pulse Generator Implant Date: 20210520

## 2023-09-25 ENCOUNTER — Ambulatory Visit
Admission: RE | Admit: 2023-09-25 | Discharge: 2023-09-25 | Disposition: A | Discharge status: Home / Self Care | Payer: Medicare Other | Source: Ambulatory Visit | Attending: Family Medicine | Admitting: Family Medicine

## 2023-09-25 DIAGNOSIS — Z1231 Encounter for screening mammogram for malignant neoplasm of breast: Secondary | ICD-10-CM

## 2023-10-24 ENCOUNTER — Ambulatory Visit (INDEPENDENT_AMBULATORY_CARE_PROVIDER_SITE_OTHER): Payer: Medicare Other

## 2023-10-24 DIAGNOSIS — I639 Cerebral infarction, unspecified: Secondary | ICD-10-CM | POA: Diagnosis not present

## 2023-10-25 LAB — CUP PACEART REMOTE DEVICE CHECK
Date Time Interrogation Session: 20250106230310
Implantable Pulse Generator Implant Date: 20210520

## 2023-11-27 ENCOUNTER — Ambulatory Visit: Payer: Medicare Other

## 2023-11-27 DIAGNOSIS — I639 Cerebral infarction, unspecified: Secondary | ICD-10-CM

## 2023-11-30 LAB — CUP PACEART REMOTE DEVICE CHECK
Date Time Interrogation Session: 20250210230052
Implantable Pulse Generator Implant Date: 20210520

## 2023-12-06 NOTE — Progress Notes (Signed)
 [facility] Summary Report / Loop Recorder

## 2023-12-06 NOTE — Addendum Note (Signed)
 Addended by: Geralyn Flash D on: 12/06/2023 12:10 PM   Modules accepted: Orders

## 2023-12-13 ENCOUNTER — Encounter: Payer: Self-pay | Admitting: Internal Medicine

## 2023-12-18 ENCOUNTER — Encounter: Payer: Self-pay | Admitting: Internal Medicine

## 2024-01-01 ENCOUNTER — Ambulatory Visit (INDEPENDENT_AMBULATORY_CARE_PROVIDER_SITE_OTHER): Payer: Medicare Other

## 2024-01-01 DIAGNOSIS — I639 Cerebral infarction, unspecified: Secondary | ICD-10-CM

## 2024-01-02 LAB — CUP PACEART REMOTE DEVICE CHECK
Date Time Interrogation Session: 20250316230203
Implantable Pulse Generator Implant Date: 20210520

## 2024-01-05 NOTE — Progress Notes (Signed)
 [facility] Summary Report / Loop Recorder

## 2024-01-05 NOTE — Addendum Note (Signed)
 Addended by: Geralyn Flash D on: 01/05/2024 10:50 AM   Modules accepted: Orders

## 2024-01-29 ENCOUNTER — Encounter: Payer: Self-pay | Admitting: Internal Medicine

## 2024-02-05 ENCOUNTER — Telehealth: Payer: Self-pay | Admitting: Family Medicine

## 2024-02-05 LAB — CUP PACEART REMOTE DEVICE CHECK
Date Time Interrogation Session: 20250420230208
Implantable Pulse Generator Implant Date: 20210520

## 2024-02-05 NOTE — Telephone Encounter (Unsigned)
 Copied from CRM [id number]. Topic: General - Other >> Feb 05, 2024  9:54 AM Clyde Darling P wrote: Reason for CRM: Pt would like a call back to go over Cologuard and the bone density.

## 2024-02-08 ENCOUNTER — Telehealth: Payer: Self-pay

## 2024-02-08 DIAGNOSIS — Z13820 Encounter for screening for osteoporosis: Secondary | ICD-10-CM

## 2024-02-08 DIAGNOSIS — Z780 Asymptomatic menopausal state: Secondary | ICD-10-CM

## 2024-02-08 NOTE — Telephone Encounter (Signed)
 Okay to place order?

## 2024-02-08 NOTE — Telephone Encounter (Signed)
 Copied from CRM [id number]. Topic: Clinical - Request for Lab/Test Order >> Feb 08, 2024  9:49 AM Georgeann Kindred wrote: Reason for CRM: Patient is requesting a bone density check. Please contact patient at [phone].

## 2024-02-09 NOTE — Addendum Note (Signed)
 Addended by: Ysidro Her on: 02/09/2024 09:00 AM   Modules accepted: Orders

## 2024-02-09 NOTE — Telephone Encounter (Signed)
 Orders placed. Pt calld. LVM advising of order

## 2024-02-20 NOTE — Addendum Note (Signed)
 Addended by: Edra Govern D on: 02/20/2024 01:03 PM   Modules accepted: Orders, Level of Service

## 2024-02-20 NOTE — Progress Notes (Signed)
 [facility] Summary Report / Loop Recorder

## 2024-03-28 ENCOUNTER — Telehealth: Payer: Self-pay | Admitting: Family Medicine

## 2024-03-28 ENCOUNTER — Telehealth: Payer: Self-pay | Admitting: Neurology

## 2024-03-28 NOTE — Telephone Encounter (Signed)
 Pt came into office to drop off forms to be filled out. Paid $50 fee.

## 2024-03-28 NOTE — Telephone Encounter (Signed)
 Pt dropped off forms to be completed  by pcp. Pt asks that they be faxed and then notified when forms are ready to be picked up. Placed forms in pcp's box in front office.

## 2024-03-29 NOTE — Telephone Encounter (Signed)
 Forms completed. Placed in folder for sig

## 2024-04-10 ENCOUNTER — Telehealth: Payer: Self-pay | Admitting: Family Medicine

## 2024-04-10 NOTE — Telephone Encounter (Signed)
 Spoke w/Pt husband regarding appt needed for Dr. Rosemarie to complete disability paperwork. Offered Pt appt for 6/26 at 9:30am. Husband accepted appt. Fee for paperwork has been paid.

## 2024-04-10 NOTE — Telephone Encounter (Signed)
 Copied from CRM [id number]. Topic: General - Other >> Apr 10, 2024  9:21 AM Willma SAUNDERS wrote: Reason for CRM: Patient calling for update on paperwork that was dropped off on 06/12. Is requesting a callback to discuss as it has been two weeks and hasn't heard anything.  Patient can be reached at [phone]

## 2024-04-10 NOTE — Telephone Encounter (Signed)
 There is a telephone note from where I put the form in Lowne's folder for sig. I was out when it was likely faxed but not in media yet. Please advise

## 2024-04-10 NOTE — Telephone Encounter (Signed)
 Pt husband called to follow up on paper work that was suppose to filled out . Pt  Husband would like call  when paperwork is done so he can pick it up

## 2024-04-11 ENCOUNTER — Ambulatory Visit (INDEPENDENT_AMBULATORY_CARE_PROVIDER_SITE_OTHER): Admitting: Neurology

## 2024-04-11 ENCOUNTER — Encounter: Payer: Self-pay | Admitting: Neurology

## 2024-04-11 ENCOUNTER — Other Ambulatory Visit (HOSPITAL_BASED_OUTPATIENT_CLINIC_OR_DEPARTMENT_OTHER)

## 2024-04-11 VITALS — BP 121/77 | HR 79 | Ht 62.0 in | Wt 126.0 lb

## 2024-04-11 DIAGNOSIS — Z8673 Personal history of transient ischemic attack (TIA), and cerebral infarction without residual deficits: Secondary | ICD-10-CM | POA: Diagnosis not present

## 2024-04-11 DIAGNOSIS — G8110 Spastic hemiplegia affecting unspecified side: Secondary | ICD-10-CM | POA: Diagnosis not present

## 2024-04-11 NOTE — Patient Instructions (Signed)
 I had a long d/w patient and her husband about her cerebellar  stroke, residual gait imbalance,risk for recurrent stroke/TIAs, personally independently reviewed imaging studies and stroke evaluation results and answered questions.Continue aspirin  81 mg daily  for secondary stroke prevention and maintain strict control of hypertension with blood pressure goal below 130/90, diabetes with hemoglobin A1c goal below 6.5% and lipids with LDL cholesterol goal below 70 mg/dL. I also advised the patient to eat a healthy diet with plenty of whole grains, cereals, fruits and vegetables, exercise regularly and maintain ideal body weight . She was advised to use her cane at all times and avoid falls and injuries.. Disability paperwork was filled out upon her request.Followup in the future with me in 1 year or call   if needed

## 2024-04-11 NOTE — Telephone Encounter (Signed)
 Do you recall signing these forms?

## 2024-04-11 NOTE — Progress Notes (Signed)
 [facility] [address] [place].  [address] [phone]       OFFICE FOLLOW-UP NOTE  [name] Date of Birth:  1960-12-03 Medical Record Number:  [id number]   HPI: Initial visit 03/04/2020  :Judy Gutierrez is a 63 year old [place] origin lady seen today for initial office follow-up visit following hospital consultation for stroke in April 2021.  She is accompanied by her husband.  History is obtained from them, review of electronic medical records and I personally reviewed imaging films in PACS.  She presented to [facility] emergency room with sudden onset of dizziness, diplopia, gait imbalance and vomiting.  She was seen by telemetry neurologist and given IV TPA posterior circulation stroke.  She was transferred to [facility].  Initial CT scan of the head was unremarkable but MRI scan showed a small left superior cerebellar as well as medial midbrain infarct.  2D echo showed normal ejection fraction without cardiac source of embolism.  Cardiac monitoring during hospitalization did not show any paroxysmal A. fib.  Transcranial Doppler bubble study was negative for PFO.  LDL cholesterol is elevated 138 mg percent.  Hemoglobin A1c was borderline at 6.0.  Hypercoagulable labs and vasculitic labs were negative.  CT angiogram of the brain and neck showed no significant large vessel stenosis in the neck of the brain.  Patient was discharged home on aspirin  and Plavix  for 3 weeks and now is currently on aspirin  alone.  Patient states she is doing well.  She is getting home physical and occupational therapy.  Her dizziness and gait imbalance have improved completely.  Double vision is also gone.  She had called the office few weeks ago complaining of eye twitching and some numbness on the right side and was wondering if this was related to the transcranial Doppler study that she had had.  The symptoms also have gone.  She is scheduled to undergo TEE  tomorrow followed by loop recorder.  She is tolerating aspirin  well without bruising or bleeding.  She is also tolerating Lipitor  well without muscle aches and pains.  Her blood pressure is well controlled.  She ran out of Cozaar   3 days ago but yet her blood pressure today is fine at 128/83. Update 08/19/2020 : She returns for follow-up after last visit 6 months ago.  She continues to do well.  She has had no recurrent stroke or TIA symptoms.  She is tolerating aspirin  well without bruising or bleeding.  She remains on Lipitor  and tolerating it well without muscle aches and pains.  She had follow-up lipid profile checked on 06/01/2020 and LDL cholesterol was 51 mg percent.  She had TEE done on 03/05/2020 which was unremarkable and she underwent loop recorder insertion.  So far monthly analysis has shown few episodes of transient A. fib which were felt to be artifact but no real A. fib was found.  She states she is recovered completely back to normal.  She has no complaints.  Her blood pressures well controlled today it is borderline at 134/68.  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 Update 05/11/2021 : Patient is seen emergently today for follow-up following recent admission for TIA.  She states that on 05/09/2021 when she bent down she noticed sudden onset of numbness involving left face as well as some dizziness and imbalance.  This did not last long and recovered.  She was admitted to  the hospital for TIA work-up and MRI scan of the brain did not show an acute stroke and encephalomalacia in bilateral cerebellum from a previous stroke.  CT angiogram of brain and neck was both unremarkable however she underwent diagnostic catheter angiogram today by Dr. 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 2.Focal circumferential narrowing of  the Lt VA at the level of  the dens also suspicious for a non flow limiting dissection..  Patient was on aspirin  prior to admission and Plavix  was added and she was discharged.  She has finished home physical occupational therapy and is planning to start outpatient therapy soon.  She states her dizziness and speech have improved.  Her strength is also a lot better with her balance and gait not back to baseline.  She is able to walk with a cane by herself but has to walk slowly and the right leg is stiff and slow.  She has had no falls or injuries.  She is also on amlodipine  and blood pressure is under good control and Lipitor  which is tolerating well without muscle aches and pains.  Her LDL was 41 mg percent and hemoglobin A1c 6.1 on 05/09/2021, she has a loop recorder in situ and so far paroxysmal A. fib has not yet been found  Update 08/19/2021: She returns for follow-up after last visit 3 months ago.  She is accompanied by husband.  Patient continues to have mild right hand diminished coordination and dexterity as well as some gait and balance difficulties.  She is able to walk short distances indoors without a cane but prefers using cane for long distances.  She has had no falls or injuries.  She is finished outpatient occupational therapy with has been doing hand exercises regularly at home.  She is still doing outpatient physical therapy but has to pay out-of-pocket.  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  She does get tired easily.  She is tolerating aspirin  well without bleeding or bruising.  She states her blood pressure is under good control.  She is remains on Lipitor  which is tolerating well without muscle aches and pain.  She had lipid profile checked a month ago by primary care physician and it was satisfactory.  She is planning on going on a trip to [place] at the end of this month.  She has no new complaints. Update 02/17/2022 : She returns  for follow-up after last visit 6 months ago.  She is accompanied by husband.  Patient continues to have some diminished fine motor skills in her hands and particularly bending during and little finger.  Her gait and balance have improved and she stopped using a cane now.  She still tends to drag her right leg which is stiff.  She has difficulties and fine motor skills like combing her hair.  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  She had lipid profile and hemoglobin A1c checked by primary physician 2 months ago and both were satisfactory.  She is tolerating Crestor  well without side effects.  Blood pressures under good control today it is 11/69.  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  No significant flow-limiting stenosis with good distal flow.  Patient has no new complaints today Update 06/23/2022 : She returns for follow-up after last visit 4 months ago.  She is accompanied by her husband.  Patient states she is doing well.  She still has some mild right leg weakness and incoordination and some gait imbalance but uses a cane.  She has had no falls or injuries.  She remains on aspirin  81 mg daily which is tolerating well without bruising or side effects.  She is also tolerating Lipitor  40 mg well without muscle aches or pains.  Her blood pressure is under good control and today it is 118/76.  She still has absence of taste and wonders if this was caused by the stroke or her medications.  Last lipid profile checked on 12/21/2021 was optimal at LDL cholesterol 39 mg percent.  She has no new complaints today. Update 06/20/2023 : She returns for follow-up after a year ago.  She is accompanied by her husband.  She continues to have mild right-sided  weakness, spasticity and incoordination.  She can use the right hand but has to do activities slowly.  She can walk short distances but has to be very careful.  She can walk with a cane long distances.  She has had no recent falls or injuries.  She has had no recurrent stroke or TIA symptoms.  She remains on aspirin  which she is tolerating well with minor bruising and no bleeding.  She is tolerating Lipitor  well without muscle aches and pains.  Last lipid profile checked on 01/05/2023 showed LDL-cholesterol to be optimal at 51 mg percent.  Hemoglobin A1c on 06/24/2022 was 6.2.  Her loop recorder has so far not yet shown paroxysmal A-fib.  She remains on disability and has brought paperwork to be filled out today.  She has no new complaints. Update 04/11/2024 : She returns for follow-up after last visit 9 months ago.  She is accompanied by her husband.  She continues to have right-sided weakness and gait difficulties.  She is now obtained long-term disability.  She is here today for her annual exam for renewal of her disability.  She has not had any recurrent stroke or TIA symptoms.  She remains on aspirin  she is tolerating well without bruising or bleeding.  Her blood pressure under good control on Norvasc .  She is tolerating Lipitor  40 mg well without muscle aches and pains.  Last lab work on 06/30/2023 showed LDL-cholesterol to be 60 mg percent.  Hemoglobin A1c was 6.1.  She can walk short distances by herself inside the house but uses a cane for long distances.  She has had no falls or injuries.  ROS : 14 system review of systems is positive for dizziness, imbalance, gait difficulty, diminished fine motor skills and all other systems negative and all systems negative PMH:  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)    Stroke (HCC)    Unspecified essential  hypertension     Social History:  Social History   Socioeconomic History   Marital status: Married    Spouse name: Not on file   Number of children: 1   Years of education: Not on file   Highest education level: Not on file  Occupational  History   Occupation: Science writer    Comment: self employed  Tobacco Use   Smoking status: Never   Smokeless tobacco: Never  Substance and Sexual Activity   Alcohol use: No   Drug use: No   Sexual activity: Yes  Other Topics Concern   Not on file  Social History Narrative   Lives with husband   Right Handed   Drinks caffeine seldom   Social Drivers of Corporate investment banker Strain: Not on file  Food Insecurity: Not on file  Transportation Needs: No Transportation Needs (02/18/2020)   PRAPARE - Administrator, Civil Service (Medical): No    Lack of Transportation (Non-Medical): No  Physical Activity: Not on file  Stress: Not on file  Social Connections: Not on file  Intimate Partner Violence: Not on file    Medications:   Current Outpatient Medications on File Prior to Visit  Medication Sig Dispense Refill   amLODipine  (NORVASC ) 5 MG tablet Take 1 tablet by mouth daily.     aspirin  81 MG EC tablet Take 1 tablet (81 mg total) by mouth daily. 90 tablet 1   atorvastatin  (LIPITOR ) 40 MG tablet Take 1 tablet (40 mg total) by mouth at bedtime. 30 tablet 2   Calcium  Carbonate-Vit D-Min (CALCIUM  1200 PO) Take 1 tablet by mouth daily.     estradiol  (ESTRACE ) 0.1 MG/GM vaginal cream Apply a pea-sized amount from index finger onto vaginal opening 2 nights a week.  Do not use applicator. 42.5 g 3   Vitamin D , Ergocalciferol , (DRISDOL ) 1.25 MG (50000 UNIT) CAPS capsule Take 1 capsule (50,000 Units total) by mouth every 7 (seven) days. 12 capsule 1   No current facility-administered medications on file prior to visit.    Allergies:  No Known Allergies  Physical Exam General: Frail middle-aged Caucasian lady seated, in  no evident distress Head: head normocephalic and atraumatic.  Neck: supple with no carotid or supraclavicular bruits Cardiovascular: regular rate and rhythm, no murmurs Musculoskeletal: no deformity Skin:  no rash/petichiae Vascular:  Normal pulses all extremities Vitals:   04/11/24 0924  BP: 121/77  Pulse: 79    Neurologic Exam Mental Status: Awake and fully alert. Oriented to place and time. Recent and remote memory intact. Attention span, concentration and fund of knowledge appropriate. Mood and affect appropriate.  Cranial Nerves: Fundoscopic exam not done s. Pupils equal, briskly reactive to light. Extraocular movements full without nystagmus. Visual fields full to confrontation. Hearing intact. Facial sensation intact. Face, tongue, palate moves normally and symmetrically.  Motor: Normal bulk and tone. Normal strength in all tested extremity muscles.  Except diminished fine finger movements on the right and mild right grip weakness.  Trace right hip flexor and ankle dorsiflexor weakness.  Tone is increased on the right with some spasticity in the right leg. Sensory.: intact to touch ,pinprick .position and vibratory sensation.  Coordination: Rapid alternating movements are slightly impaired on the right proportionate to degree of weakness.   Gait and Station: Arises from chair without difficulty. Stance is normal.  Mildly spastic ataxic gait with stiffness and dragging of the right leg but no foot drop..  Reflexes: 2+ and asymmetric and brisker on the right. Toes downgoing.   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  Posterior circulation TIA on 05/09/2021 likely symptomatic from partially healed vertebral artery dissection.  Vascular  risk factors of hyperlipidemia and vertebral artery dissection only.  Patient is doing well but has mild residual right-sided ataxia and gait imbalance.     PLAN: I had a long d/w patient and her husband about her cerebellar  stroke, residual gait imbalance,risk for recurrent stroke/TIAs, personally independently reviewed imaging studies and stroke evaluation results and answered questions.Continue aspirin  81 mg daily  for secondary stroke prevention and maintain strict control of hypertension with blood pressure goal below 130/90, diabetes with hemoglobin A1c goal below 6.5% and lipids with LDL cholesterol goal below 70 mg/dL. I also advised the patient to eat a healthy diet with plenty of whole grains, cereals, fruits and vegetables, exercise regularly and maintain ideal body weight . She was advised to use her cane at all times and avoid falls and injuries.. Disability paperwork was filled out upon her request.Followup in the future with me in 1 year or call   if needed Greater than 50% of time during this  35-minute isit was spent on counseling,explanation of diagnosis, of TIA and vertebral artery dissection planning of further management, discussion with patient and family and coordination of care Eather Popp, MD  [facility] [address] [place], [place] [address]  Phone [phone] Fax [phone] Note: This document was prepared with digital dictation and possible smart phrase technology. Any transcriptional errors that result from this process are unintentional

## 2024-04-16 NOTE — Telephone Encounter (Signed)
 Forms faxed and patient made aware. Copies placed at the front

## 2024-04-16 NOTE — Telephone Encounter (Signed)
 Per Dr. Bucky visit note on 04/11/24, disability paperwork was completed and given to patient.

## 2024-05-06 ENCOUNTER — Telehealth: Payer: Self-pay | Admitting: Family Medicine

## 2024-05-06 NOTE — Telephone Encounter (Signed)
 Copied from CRM [id number]. Topic: Medicare AWV >> May 06, 2024 11:13 AM Nathanel DEL wrote: Reason for CRM: Called LVM 05/06/2024 to schedule AWV. Please schedule Virtual or Telehealth visits ONLY.   Nathanel Paschal; Care Guide Ambulatory Clinical Support [place] [facility] Direct Dial: [phone]

## 2024-06-03 ENCOUNTER — Ambulatory Visit (INDEPENDENT_AMBULATORY_CARE_PROVIDER_SITE_OTHER): Admitting: Family Medicine

## 2024-06-03 ENCOUNTER — Encounter: Payer: Self-pay | Admitting: Family Medicine

## 2024-06-03 VITALS — BP 112/76 | HR 83 | Temp 98.3°F | Resp 16 | Ht 62.0 in | Wt 125.8 lb

## 2024-06-03 DIAGNOSIS — I10 Essential (primary) hypertension: Secondary | ICD-10-CM

## 2024-06-03 DIAGNOSIS — I5032 Chronic diastolic (congestive) heart failure: Secondary | ICD-10-CM

## 2024-06-03 DIAGNOSIS — R739 Hyperglycemia, unspecified: Secondary | ICD-10-CM | POA: Diagnosis not present

## 2024-06-03 DIAGNOSIS — E559 Vitamin D deficiency, unspecified: Secondary | ICD-10-CM

## 2024-06-03 DIAGNOSIS — I63441 Cerebral infarction due to embolism of right cerebellar artery: Secondary | ICD-10-CM

## 2024-06-03 DIAGNOSIS — E785 Hyperlipidemia, unspecified: Secondary | ICD-10-CM | POA: Diagnosis not present

## 2024-06-03 DIAGNOSIS — Z0000 Encounter for general adult medical examination without abnormal findings: Secondary | ICD-10-CM | POA: Diagnosis not present

## 2024-06-03 DIAGNOSIS — Z95811 Presence of heart assist device: Secondary | ICD-10-CM | POA: Insufficient documentation

## 2024-06-03 DIAGNOSIS — I7774 Dissection of vertebral artery: Secondary | ICD-10-CM

## 2024-06-03 NOTE — Assessment & Plan Note (Signed)
 Per cardiology

## 2024-06-03 NOTE — Assessment & Plan Note (Signed)
 Per neuro Stable Residual fine motor difticulty R hand and leg

## 2024-06-03 NOTE — Assessment & Plan Note (Signed)
 Well controlled, no changes to meds. Encouraged heart healthy diet such as the DASH diet and exercise as tolerated.

## 2024-06-03 NOTE — Assessment & Plan Note (Signed)
 Tolerating statin, encouraged heart healthy diet, avoid trans fats, minimize simple carbs and saturated fats. Increase exercise as tolerated

## 2024-06-03 NOTE — Progress Notes (Signed)
 ++  Subjective:    Judy Gutierrez is a 63 y.o. female who presents for a Welcome to Medicare exam.    Discussed the use of AI scribe software for clinical note transcription with the patient, who gave verbal consent to proceed.  History of Present Illness Judy Gutierrez is a 63 year old female who presents for a Medicare wellness visit.  She experiences difficulty walking due to issues with her right hand and right leg, describing them as not working well. She reports struggling with fine motor skills.  She experiences increased sweating, particularly at night, which she attributes to menopause. She uses a fan in her room to help manage the night sweats. No issues with her stomach and joints, except for sweating due to menopause.  She has a small wart on her finger that causes pain when squeezed. She is considering using an over-the-counter callus or wart remover.  She engages in daily yoga for one hour and performs work-related physical activities at home. She traveled to [place], [place], and [place] with her husband this summer.  Her memory is currently good, and she does not have any hearing issues. No new surgeries or changes in family history.        Objective:    Today's Vitals   06/03/24 1549  BP: 112/76  Pulse: 83  Resp: 16  Temp: 98.3 F (36.8 C)  TempSrc: Oral  SpO2: 97%  Weight: 125 lb 12.8 oz (57.1 kg)  Height: 5' 2 (1.575 m)  Body mass index is 23.01 kg/m.  Medications Outpatient Encounter Medications as of 06/03/2024  Medication Sig   amLODipine  (NORVASC ) 5 MG tablet Take 1 tablet by mouth daily.   aspirin  81 MG EC tablet Take 1 tablet (81 mg total) by mouth daily.   atorvastatin  (LIPITOR ) 40 MG tablet Take 1 tablet (40 mg total) by mouth at bedtime.   Calcium  Carbonate-Vit D-Min (CALCIUM  1200 PO) Take 1 tablet by mouth daily.   estradiol  (ESTRACE ) 0.1 MG/GM vaginal cream Apply a pea-sized amount from index finger onto vaginal opening 2 nights a  week.  Do not use applicator.   Vitamin D , Ergocalciferol , (DRISDOL ) 1.25 MG (50000 UNIT) CAPS capsule Take 1 capsule (50,000 Units total) by mouth every 7 (seven) days.   No facility-administered encounter medications on file as of 06/03/2024.     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)    Stroke [facility])    Unspecified essential hypertension    Past Surgical History:  Procedure Laterality Date   BUBBLE STUDY  03/05/2020   Procedure: BUBBLE STUDY;  Surgeon: Kate Lonni CROME, MD;  Location: [facility] ENDOSCOPY;  Service: Cardiovascular;;   CESAREAN SECTION  1992   IR ANGIO INTRA EXTRACRAN SEL COM CAROTID INNOMINATE BILAT MOD SED  05/11/2021   IR ANGIO VERTEBRAL SEL SUBCLAVIAN INNOMINATE BILAT MOD SED  05/11/2021   LOOP RECORDER INSERTION N/A 03/05/2020   Procedure: LOOP RECORDER INSERTION;  Surgeon: Fernande Elspeth BROCKS, MD;  Location: [facility] INVASIVE CV LAB;  Service: Cardiovascular;  Laterality: N/A;   TEE WITHOUT CARDIOVERSION N/A 03/05/2020   Procedure: TRANSESOPHAGEAL ECHOCARDIOGRAM (TEE);  Surgeon: Kate Lonni CROME, MD;  Location: [facility] ENDOSCOPY;  Service: Cardiovascular;  Laterality: N/A;    Family History  Problem Relation Age of Onset   Pulmonary embolism Mother  Hepatitis Father    Diabetes Father    Breast cancer Sister 35   Diabetes Mellitus II Sister    Social History   Occupational History   Occupation: Science writer    Comment: self employed  Tobacco Use   Smoking status: Never   Smokeless tobacco: Never  Substance and Sexual Activity   Alcohol use: No   Drug use: No   Sexual activity: Yes    Tobacco Counseling Counseling given: Not Answered   Immunizations and Health Maintenance Immunization History  Administered Date(s) Administered   Influenza Split 09/14/2013   Influenza,inj,Quad PF,6+ Mos  09/10/2014, 09/04/2015, 10/13/2016, 06/28/2018, 06/28/2021, 06/24/2022   PFIZER SARS-COV-2 Pediatric Vaccination 5-56yrs 09/05/2020   PFIZER(Purple Top)SARS-COV-2 Vaccination 12/16/2019, 01/10/2020, 08/26/2020   Td 06/28/2018   Zoster Recombinant(Shingrix ) 07/27/2018, 03/16/2022, 05/17/2022   Zoster, Live 07/27/2018   Health Maintenance Due  Topic Date Due   Cervical Cancer Screening (HPV/Pap Cotest)  02/15/2016   COVID-19 Vaccine (4 - 2024-25 season) 06/18/2023   INFLUENZA VACCINE  05/17/2024    Activities of Daily Living    06/03/2024    3:49 PM  In your present state of health, do you have any difficulty performing the following activities:  Hearing? 0  Vision? 0  Difficulty concentrating or making decisions? 1  Walking or climbing stairs? 1  Dressing or bathing? 1  Doing errands, shopping? 0    Physical Exam   Physical Exam Vitals and nursing note reviewed.  Constitutional:      General: She is not in acute distress.    Appearance: Normal appearance. She is well-developed.  HENT:     Head: Normocephalic and atraumatic.     Right Ear: Tympanic membrane, ear canal and external ear normal. There is no impacted cerumen.     Left Ear: Tympanic membrane, ear canal and external ear normal. There is no impacted cerumen.     Nose: Nose normal.     Mouth/Throat:     Mouth: Mucous membranes are moist.     Pharynx: Oropharynx is clear. No oropharyngeal exudate or posterior oropharyngeal erythema.  Eyes:     General: No scleral icterus.       Right eye: No discharge.        Left eye: No discharge.     Conjunctiva/sclera: Conjunctivae normal.     Pupils: Pupils are equal, round, and reactive to light.  Neck:     Thyroid : No thyromegaly or thyroid  tenderness.     Vascular: No JVD.  Cardiovascular:     Rate and Rhythm: Normal rate and regular rhythm.     Heart sounds: Normal heart sounds. No murmur heard. Pulmonary:     Effort: Pulmonary effort is normal. No respiratory  distress.     Breath sounds: Normal breath sounds.  Abdominal:     General: Bowel sounds are normal. There is no distension.     Palpations: Abdomen is soft. There is no mass.     Tenderness: There is no abdominal tenderness. There is no guarding or rebound.  Genitourinary:    Vagina: Normal.     Comments: Per gyn Musculoskeletal:        General: Normal range of motion.     Cervical back: Normal range of motion and neck supple.     Right lower leg: No edema.     Left lower leg: No edema.  Lymphadenopathy:     Cervical: No cervical adenopathy.  Skin:    General: Skin is warm and dry.  Findings: No erythema or rash.  Neurological:     General: No focal deficit present.     Mental Status: She is alert and oriented to person, place, and time.     Cranial Nerves: No cranial nerve deficit.     Deep Tendon Reflexes: Reflexes are normal and symmetric.  Psychiatric:        Mood and Affect: Mood normal.        Behavior: Behavior normal.        Thought Content: Thought content normal.        Judgment: Judgment normal.    (optional), or other factors deemed appropriate based on the beneficiary's medical and social history and current clinical standards.   Advanced Directives: Does Patient Have a Medical Advance Directive?: Yes Type of Advance Directive: Healthcare Power of Attorney, Living will Does patient want to make changes to medical advance directive?: No - Patient declined Copy of Healthcare Power of Attorney in Chart?: No - copy requested  EKG:  unchanged from previous tracings      Assessment:    This is a routine wellness examination for this patient .  Vision/Hearing screen Vision Screening   Right eye Left eye Both eyes  Without correction     With correction 20/20 20/20 20/25   Hearing Screening - Comments:: Whisper hearing test normal   Goals   None     Depression Screen    06/03/2024    3:53 PM 01/05/2023    1:12 PM 07/08/2021   10:34 AM 03/29/2021     9:15 AM  PHQ 2/9 Scores  PHQ - 2 Score 0 0 0 0  PHQ- 9 Score    1     Fall Risk    06/03/2024    3:50 PM  Fall Risk   Falls in the past year? 0  Number falls in past yr: 0  Injury with Fall? 0  Follow up Falls evaluation completed    Cognitive Function:    06/03/2024    4:20 PM  MMSE - Mini Mental State Exam  Orientation to time 5  Orientation to Place 5  Registration 3  Attention/ Calculation 5  Recall 3  Language- name 2 objects 2  Language- repeat 1  Language- follow 3 step command 3  Language- read & follow direction 1  Write a sentence 1  Copy design 1  Total score 30        Patient Care Team: Antonio Meth, Jamee SAUNDERS, DO as PCP - [facility], [facility] Rosemarie Eather RAMAN, MD as Consulting Physician (Neurology)     Plan:     I have personally reviewed and noted the following in the patient's chart:   Medical and social history Use of alcohol, tobacco or illicit drugs  Current medications and supplements including opioid prescriptions. Patient is not currently taking opioid prescriptions. Functional ability and status Nutritional status Physical activity Advanced directives List of other physicians Hospitalizations, surgeries, and ER visits in previous 12 months Vitals Screenings to include cognitive, depression, and falls Referrals and appointments  In addition, I have reviewed and discussed with patient certain preventive protocols, quality metrics, and best practice recommendations. A written personalized care plan for preventive services as well as general preventive health recommendations were provided to patient.   Assessment and Plan Assessment & Plan Welcome to Medicare Visit   Routine Medicare wellness visit conducted. No changes in family history or recent surgeries. She engages in daily yoga for exercise. Memory remains  intact. She declined the pneumonia vaccine. Perform EKG and vision screening.  Difficulty  with right hand and right leg function   She reports difficulty with fine motor skills in the right hand and right leg. Strength is good, but fine motor skills are impaired.  Menopausal symptoms (hot flashes, night sweats)   She experiences increased sweating, particularly at night, likely due to menopause. Symptoms have worsened recently. Previously discussed with a gynecologist when symptoms were less severe.  Low vitamin D , under evaluation   Previously diagnosed with low vitamin D  and currently taking vitamin D3 supplements. Awaiting lab results to determine current vitamin D  levels.  Possible wart or callus on finger   A lesion on the finger, possibly a wart or callus, causes pain when squeezed. Try over-the-counter wart or callus remover. If ineffective, return for possible cryotherapy.    Satchel Heidinger R Lowne Chase, DO 06/03/2024

## 2024-06-03 NOTE — Patient Instructions (Addendum)
 Preventive Care 63-63 Years Old, Female  Preventive care refers to lifestyle choices and visits with your health care provider that can promote health and wellness. Preventive care visits are also called wellness exams.  What can I expect for my preventive care visit?  Counseling  Your health care provider may ask you questions about your:  Medical history, including:  Past medical problems.  Family medical history.  Pregnancy history.  Current health, including:  Menstrual cycle.  Method of birth control.  Emotional well-being.  Home life and relationship well-being.  Sexual activity and sexual health.  Lifestyle, including:  Alcohol, nicotine or tobacco, and drug use.  Access to firearms.  Diet, exercise, and sleep habits.  Work and work Astronomer.  Sunscreen use.  Safety issues such as seatbelt and bike helmet use.  Physical exam  Your health care provider will check your:  Height and weight. These may be used to calculate your BMI (body mass index). BMI is a measurement that tells if you are at a healthy weight.  Waist circumference. This measures the distance around your waistline. This measurement also tells if you are at a healthy weight and may help predict your risk of certain diseases, such as type 2 diabetes and high blood pressure.  Heart rate and blood pressure.  Body temperature.  Skin for abnormal spots.  What immunizations do I need?    Vaccines are usually given at various ages, according to a schedule. Your health care provider will recommend vaccines for you based on your age, medical history, and lifestyle or other factors, such as travel or where you work.  What tests do I need?  Screening  Your health care provider may recommend screening tests for certain conditions. This may include:  Lipid and cholesterol levels.  Diabetes screening. This is done by checking your blood sugar (glucose) after you have not eaten for a while (fasting).  Pelvic exam and Pap test.  Hepatitis B test.  Hepatitis C  test.  HIV (human immunodeficiency virus) test.  STI (sexually transmitted infection) testing, if you are at risk.  Lung cancer screening.  Colorectal cancer screening.  Mammogram. Talk with your health care provider about when you should start having regular mammograms. This may depend on whether you have a family history of breast cancer.  BRCA-related cancer screening. This may be done if you have a family history of breast, ovarian, tubal, or peritoneal cancers.  Bone density scan. This is done to screen for osteoporosis.  Talk with your health care provider about your test results, treatment options, and if necessary, the need for more tests.  Follow these instructions at home:  Eating and drinking    Eat a diet that includes fresh fruits and vegetables, whole grains, lean protein, and low-fat dairy products.  Take vitamin and mineral supplements as recommended by your health care provider.  Do not drink alcohol if:  Your health care provider tells you not to drink.  You are pregnant, may be pregnant, or are planning to become pregnant.  If you drink alcohol:  Limit how much you have to 63-1 drink a day.  Know how much alcohol is in your drink. In the [place] one drink equals one 12 oz bottle of beer (355 mL), one 5 oz glass of wine (148 mL), or one 1 oz glass of hard liquor (44 mL).  Lifestyle  Brush your teeth every morning and night with fluoride toothpaste. Floss one time each day.  Exercise for at least  30 minutes 5 or more days each week.  Do not use any products that contain nicotine or tobacco. These products include cigarettes, chewing tobacco, and vaping devices, such as e-cigarettes. If you need help quitting, ask your health care provider.  Do not use drugs.  If you are sexually active, practice safe sex. Use a condom or other form of protection to prevent STIs.  If you do not wish to become pregnant, use a form of birth control. If you plan to become pregnant, see your health care provider for a  prepregnancy visit.  Take aspirin only as told by your health care provider. Make sure that you understand how much to take and what form to take. Work with your health care provider to find out whether it is safe and beneficial for you to take aspirin daily.  Find healthy ways to manage stress, such as:  Meditation, yoga, or listening to music.  Journaling.  Talking to a trusted person.  Spending time with friends and family.  Minimize exposure to UV radiation to reduce your risk of skin cancer.  Safety  Always wear your seat belt while driving or riding in a vehicle.  Do not drive:  If you have been drinking alcohol. Do not ride with someone who has been drinking.  When you are tired or distracted.  While texting.  If you have been using any mind-altering substances or drugs.  Wear a helmet and other protective equipment during sports activities.  If you have firearms in your house, make sure you follow all gun safety procedures.  Seek help if you have been physically or sexually abused.  What's next?  Visit your health care provider once a year for an annual wellness visit.  Ask your health care provider how often you should have your eyes and teeth checked.  Stay up to date on all vaccines.  This information is not intended to replace advice given to you by your health care provider. Make sure you discuss any questions you have with your health care provider.  Document Revised: 03/31/2021 Document Reviewed: 03/31/2021  Elsevier Patient Education  2024 [employer].

## 2024-06-04 LAB — COMPREHENSIVE METABOLIC PANEL WITH GFR
ALT: 29 U/L (ref 0–35)
AST: 37 U/L (ref 0–37)
Albumin: 4 g/dL (ref 3.5–5.2)
Alkaline Phosphatase: 93 U/L (ref 39–117)
BUN: 19 mg/dL (ref 6–23)
CO2: 29 meq/L (ref 19–32)
Calcium: 9.2 mg/dL (ref 8.4–10.5)
Chloride: 104 meq/L (ref 96–112)
Creatinine, Ser: 0.58 mg/dL (ref 0.40–1.20)
GFR: 96.75 mL/min (ref >60.00)
Glucose, Bld: 102 mg/dL — ABNORMAL HIGH (ref 70–99)
Potassium: 4.1 meq/L (ref 3.5–5.1)
Sodium: 141 meq/L (ref 135–145)
Total Bilirubin: 1 mg/dL (ref 0.2–1.2)
Total Protein: 7.5 g/dL (ref 6.0–8.3)

## 2024-06-04 LAB — LIPID PANEL
Cholesterol: 87 mg/dL (ref 0–200)
HDL: 52.4 mg/dL (ref >39.00)
LDL Cholesterol: 23 mg/dL (ref 0–99)
NonHDL: 34.12
Total CHOL/HDL Ratio: 2
Triglycerides: 55 mg/dL (ref 0.0–149.0)
VLDL: 11 mg/dL (ref 0.0–40.0)

## 2024-06-04 LAB — CBC WITH DIFFERENTIAL/PLATELET
Basophils Absolute: 0 K/uL (ref 0.0–0.1)
Basophils Relative: 0.6 % (ref 0.0–3.0)
Eosinophils Absolute: 0.2 K/uL (ref 0.0–0.7)
Eosinophils Relative: 3.8 % (ref 0.0–5.0)
HCT: 36.2 % (ref 36.0–46.0)
Hemoglobin: 11.8 g/dL — ABNORMAL LOW (ref 12.0–15.0)
Lymphocytes Relative: 35.9 % (ref 12.0–46.0)
Lymphs Abs: 1.6 K/uL (ref 0.7–4.0)
MCHC: 32.6 g/dL (ref 30.0–36.0)
MCV: 85.8 fl (ref 78.0–100.0)
Monocytes Absolute: 0.3 K/uL (ref 0.1–1.0)
Monocytes Relative: 5.8 % (ref 3.0–12.0)
Neutro Abs: 2.4 K/uL (ref 1.4–7.7)
Neutrophils Relative %: 53.9 % (ref 43.0–77.0)
Platelets: 273 K/uL (ref 150.0–400.0)
RBC: 4.22 Mil/uL (ref 3.87–5.11)
RDW: 13.4 % (ref 11.5–15.5)
WBC: 4.4 K/uL (ref 4.0–10.5)

## 2024-06-04 LAB — TSH: TSH: 1.71 u[IU]/mL (ref 0.35–5.50)

## 2024-06-04 LAB — VITAMIN D 25 HYDROXY (VIT D DEFICIENCY, FRACTURES): VITD: 47.91 ng/mL (ref 30.00–100.00)

## 2024-06-05 ENCOUNTER — Telehealth: Payer: Self-pay

## 2024-06-05 NOTE — Telephone Encounter (Signed)
 Copied from CRM [id number]. Topic: Clinical - Lab/Test Results >> Jun 05, 2024  1:48 PM Martinique E wrote: Reason for CRM: Patient called in and would like to discuss her lab results from 8/18, specifically her hemoglobin levels. Callback number [phone].

## 2024-06-06 ENCOUNTER — Ambulatory Visit: Payer: Self-pay | Admitting: Family Medicine

## 2024-06-07 NOTE — Telephone Encounter (Signed)
 Copied from CRM #[id number]. Topic: Clinical - Prescription Issue >> Jun 06, 2024  5:01 PM Shereese L wrote: Reason for CRM: patient needs know how many mg of iron pills should she be taking and she would like call back instead of a message on my chart >> Jun 07, 2024  9:38 AM Terri G wrote: Patient called again stating she still hasn't received called. Advised someone will reach out today.

## 2024-06-07 NOTE — Telephone Encounter (Signed)
 Copied from CRM #[id number]. Topic: Clinical - Prescription Issue >> Jun 06, 2024  5:01 PM Shereese L wrote: Reason for CRM: patient needs know how many mg of iron pills should she be taking and she would like call back instead of a message on my chart

## 2024-06-19 NOTE — Telephone Encounter (Unsigned)
 Copied from CRM #[id number]. Topic: Clinical - Prescription Issue >> Jun 06, 2024  5:01 PM Shereese L wrote: Reason for CRM: patient needs know how many mg of iron pills should she be taking and she would like call back instead of a message on my chart

## 2024-06-19 NOTE — Telephone Encounter (Signed)
 Pt called upset due to not getting a call back about her labs from 8/21. I assured her she would be getting a call.

## 2024-08-26 ENCOUNTER — Other Ambulatory Visit: Payer: Self-pay | Admitting: Family Medicine

## 2024-08-26 DIAGNOSIS — Z1231 Encounter for screening mammogram for malignant neoplasm of breast: Secondary | ICD-10-CM

## 2024-09-26 ENCOUNTER — Ambulatory Visit
Admission: RE | Admit: 2024-09-26 | Discharge: 2024-09-26 | Disposition: A | Discharge status: Home / Self Care | Source: Ambulatory Visit | Attending: Family Medicine | Admitting: Family Medicine

## 2024-09-26 DIAGNOSIS — Z1231 Encounter for screening mammogram for malignant neoplasm of breast: Secondary | ICD-10-CM

## 2024-10-25 ENCOUNTER — Other Ambulatory Visit
